# Patient Record
Sex: Female | Born: 1940 | Race: White | Hispanic: No | Smoking: Never smoker
Health system: Southern US, Community
[De-identification: ages and names within clinical notes are randomized; demographics above are authoritative.]

## PROBLEM LIST (undated history)

## (undated) DIAGNOSIS — E039 Hypothyroidism, unspecified: Secondary | ICD-10-CM

## (undated) DIAGNOSIS — I509 Heart failure, unspecified: Secondary | ICD-10-CM

## (undated) DIAGNOSIS — I6529 Occlusion and stenosis of unspecified carotid artery: Secondary | ICD-10-CM

## (undated) DIAGNOSIS — N186 End stage renal disease: Secondary | ICD-10-CM

## (undated) DIAGNOSIS — N189 Chronic kidney disease, unspecified: Secondary | ICD-10-CM

## (undated) DIAGNOSIS — M1990 Unspecified osteoarthritis, unspecified site: Secondary | ICD-10-CM

## (undated) DIAGNOSIS — J45909 Unspecified asthma, uncomplicated: Secondary | ICD-10-CM

## (undated) DIAGNOSIS — C4491 Basal cell carcinoma of skin, unspecified: Secondary | ICD-10-CM

## (undated) DIAGNOSIS — I10 Essential (primary) hypertension: Secondary | ICD-10-CM

## (undated) DIAGNOSIS — F329 Major depressive disorder, single episode, unspecified: Secondary | ICD-10-CM

## (undated) DIAGNOSIS — E119 Type 2 diabetes mellitus without complications: Secondary | ICD-10-CM

## (undated) DIAGNOSIS — N184 Chronic kidney disease, stage 4 (severe): Secondary | ICD-10-CM

## (undated) DIAGNOSIS — F32A Depression, unspecified: Secondary | ICD-10-CM

## (undated) DIAGNOSIS — R0600 Dyspnea, unspecified: Secondary | ICD-10-CM

## (undated) DIAGNOSIS — D649 Anemia, unspecified: Secondary | ICD-10-CM

## (undated) DIAGNOSIS — Z992 Dependence on renal dialysis: Secondary | ICD-10-CM

## (undated) DIAGNOSIS — C801 Malignant (primary) neoplasm, unspecified: Secondary | ICD-10-CM

## (undated) HISTORY — DX: Occlusion and stenosis of unspecified carotid artery: I65.29

## (undated) HISTORY — PX: TONSILLECTOMY: SUR1361

## (undated) HISTORY — PX: APPENDECTOMY: SHX54

## (undated) HISTORY — PX: TUBAL LIGATION: SHX77

---
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
## 1998-10-30 ENCOUNTER — Other Ambulatory Visit: Admission: RE | Admit: 1998-10-30 | Discharge: 1998-10-30 | Payer: Self-pay | Admitting: Obstetrics and Gynecology

## 1998-12-05 ENCOUNTER — Other Ambulatory Visit: Admission: RE | Admit: 1998-12-05 | Discharge: 1998-12-05 | Payer: Self-pay | Admitting: Obstetrics and Gynecology

## 1999-03-07 ENCOUNTER — Ambulatory Visit (HOSPITAL_COMMUNITY): Admission: RE | Admit: 1999-03-07 | Discharge: 1999-03-07 | Payer: Self-pay | Admitting: Obstetrics & Gynecology

## 2000-04-29 ENCOUNTER — Other Ambulatory Visit: Admission: RE | Admit: 2000-04-29 | Discharge: 2000-04-29 | Payer: Self-pay | Admitting: Obstetrics and Gynecology

## 2000-07-28 ENCOUNTER — Ambulatory Visit (HOSPITAL_COMMUNITY): Admission: RE | Admit: 2000-07-28 | Discharge: 2000-07-28 | Payer: Self-pay | Admitting: Obstetrics and Gynecology

## 2000-07-28 ENCOUNTER — Encounter (INDEPENDENT_AMBULATORY_CARE_PROVIDER_SITE_OTHER): Payer: Self-pay | Admitting: Specialist

## 2002-08-02 ENCOUNTER — Emergency Department (HOSPITAL_COMMUNITY): Admission: EM | Admit: 2002-08-02 | Discharge: 2002-08-02 | Payer: Self-pay | Admitting: Emergency Medicine

## 2004-02-29 ENCOUNTER — Other Ambulatory Visit: Admission: RE | Admit: 2004-02-29 | Discharge: 2004-02-29 | Payer: Self-pay | Admitting: Obstetrics and Gynecology

## 2004-10-02 ENCOUNTER — Encounter: Admission: RE | Admit: 2004-10-02 | Discharge: 2004-10-02 | Payer: Self-pay | Admitting: Obstetrics and Gynecology

## 2005-04-29 ENCOUNTER — Other Ambulatory Visit: Admission: RE | Admit: 2005-04-29 | Discharge: 2005-04-29 | Payer: Self-pay | Admitting: Obstetrics and Gynecology

## 2006-05-28 ENCOUNTER — Encounter: Admission: RE | Admit: 2006-05-28 | Discharge: 2006-05-28 | Payer: Self-pay | Admitting: Obstetrics and Gynecology

## 2006-11-09 ENCOUNTER — Other Ambulatory Visit: Admission: RE | Admit: 2006-11-09 | Discharge: 2006-11-09 | Payer: Self-pay | Admitting: Obstetrics and Gynecology

## 2007-07-28 ENCOUNTER — Encounter: Admission: RE | Admit: 2007-07-28 | Discharge: 2007-07-28 | Payer: Self-pay | Admitting: Obstetrics and Gynecology

## 2007-12-29 ENCOUNTER — Other Ambulatory Visit: Admission: RE | Admit: 2007-12-29 | Discharge: 2007-12-29 | Payer: Self-pay | Admitting: Obstetrics and Gynecology

## 2009-12-31 ENCOUNTER — Other Ambulatory Visit: Admission: RE | Admit: 2009-12-31 | Discharge: 2009-12-31 | Payer: Self-pay | Admitting: Obstetrics and Gynecology

## 2011-02-14 ENCOUNTER — Other Ambulatory Visit: Payer: Self-pay | Admitting: Internal Medicine

## 2011-02-14 DIAGNOSIS — Z1231 Encounter for screening mammogram for malignant neoplasm of breast: Secondary | ICD-10-CM

## 2011-03-10 ENCOUNTER — Ambulatory Visit: Payer: Self-pay

## 2011-05-09 NOTE — Op Note (Signed)
Cassandra Baker, Cassandra Baker [facility]                        ACCOUNT NO.:  [id number]   MEDICAL RECORD NO.:  [id number]                   PATIENT TYPE:  EMS   LOCATION:  MINO                                 FACILITY:  [facility]   PHYSICIAN:  Youlanda Mighty. Luisa Dago., M.D.          DATE OF BIRTH:  01-Dec-1941   DATE OF PROCEDURE:  08/02/2002  DATE OF DISCHARGE:                                 OPERATIVE REPORT   PREOPERATIVE DIAGNOSES:  1. Stage 3 paronychia left ring finger.  2. Felon infection left ring finger pulp.   OPERATIONS:  1. Incision and drainage of nail wall abscess.  2. Drainage of left ring finger pulp.   SURGEON:  Youlanda Mighty. Sypher, M.D.   ASSISTANT:  None.   ANESTHESIA:  Lidocaine 2% and 0.25% Marcaine, metacarpal head level block,  left ring finger.   INDICATIONS:  The patient is a 70 year old woman, who recently returned from  a mission trip to [place], who had sustained an infection of the ulnar nail  fold of her left ring finger.  She had observed this for several days, noted  progressive collection of purulent material along the nail wall and dorsal  nail fold.  She saw her physician in [place], [place] earlier today and was placed  on Cipro oral antibiotic therapy.   This evening she developed a violaceous bleb on the dorsal aspect of her  finger.  She became alarmed and concerned that she may have a flesh-eating  bacterial infection.  She sought an urgent consult at [facility] and, hence, a  reconsult was requested.   Clinical examination at this time revealed an aggressive infection  consistent with either Staph or Streptococcal organisms.  She clearly had a  felon with increased turgor of her pulp and a purulent bleb on the dorsal  aspect of the nail fold.  We recommended an incision and drainage and  followup with continued antibiotic therapy.   DESCRIPTION OF PROCEDURE:  The patient was seen in the minor operating room  in a supine position on a gurney.  After informed consent, a  0.25% Marcaine  and 2% lidocaine metacarpal head level block was placed with a 27-gauge  needle.  When the anesthesia was satisfactory, the arm was prepped with  Betadine soap and solution, sterilely draped with sterile towels.  No  tourniquet was employed.   A 4 mm strip of the nail plate was removed from the ulnar aspect of the nail  fold.  With removal of the nail plate, the dorsal purulence was drained.  Debris was removed from along the ulnar nail wall.  The pulp was then  drained utilizing iris scissors along the periosteal surface of the distal  phalanx beginning ulnarly and moving radially.   Purulent material was recovered from the pulp.  After satisfactory  decompression of the pulp, the wound was irrigated with Betadine and  subsequently dressed with Xeroflo sterile gauze and Coban.  The patient was encouraged to take a second Cipro 500 mg tablet at this time  and will continue with 500 mg p.o. q.12 h.  She will return to our office  for followup in approximately 12 hours and at that time, will have a  dressing change and will initiate saline soaks.   She will contact our office p.r.n. fever or signs of worsening infection.                                               Youlanda Mighty Luisa Dago., M.D.    RVS/MEDQ  D:  08/02/2002  T:  08/04/2002  Job:  [id number]

## 2011-05-09 NOTE — H&P (Signed)
[facility]  Patient:    Cassandra Baker, Cassandra Baker                     MRN: [id number] Adm. Date:  07/28/00 Attending:  Olivia Canter. Theda Sers, M.D. CC:         [facility] Day Surgery             [facility]                         History and Physical  DATE OF BIRTH:                [birth year]  HISTORY OF PRESENT ILLNESS:   The patient is a 70 year old, gravida 1, para 1, white female, who is menopausal and manifested postmenopausal bleeding. Subsequently, she underwent a sonohysterogram which showed a 2.5 cm and a 1.3 endometrial polyp in the endometrial cavity.  The patient was advised to undergo D&C hysteroscopy to remove the polyps.  Risks of surgery including anesthetic complication, hemorrhage, infection, damage to adjacent structures including bladder, bowel, blood vessels, or ureters were discussed with the patient.  She was made aware of the risk of uterine perforation which could result in overwhelming life-threatening hemorrhage requiring emergent hysterectomy.  She was also made aware of the risk uterine perforation which could result in bowel damage requiring emergent colostomy.  The patient expresses understanding of and acceptance of these risks.  PAST MEDICAL HISTORY:         Significant for hypothyroidism and appendectomy.  ALLERGIES:                    PENICILLIN.  MEDICATIONS:                  Synthroid, allopurinol, Lipitor, and Lasix.  FAMILY HISTORY:               Mother and father with type 2 diabetes mellitus. Mother with two MIs.  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  REVIEW OF SYSTEMS:            Negative.  PHYSICAL EXAMINATION:  HEENT:                        Normal.  NECK:                         Supple without thyromegaly.  LUNGS:                        Clear to auscultation.  HEART:                        Regular rate and  rhythm.  ABDOMEN:                      Soft, nontender.  No hepatosplenomegaly.  PELVIC:                       Uterus slightly retroverted, six weeks mobile. No adnexal mass palpated.  Rectovaginal confirmatory.  No mass.  ASSESSMENT AND PLAN:          The patient is a 69 year old, gravida 1, para 1, with a history of  postmenopausal bleeding found to have endometrial polyps admitted for dilation and curettage hysteroscopy.  Risks have been discussed with the patient and she expresses understanding of and acceptance of these risks. DD:  07/28/00 TD:  07/28/00 Job: [id number]

## 2011-05-09 NOTE — Op Note (Signed)
[facility]  Patient:    Cassandra Baker, CUI [facility]                     MRN: [id number] Proc. Date: 07/28/00 Adm. Date:  [id number] Attending:  Lovey Newcomer CC:         Olivia Canter. Theda Sers, M.D.                           Operative Report  PREOPERATIVE DIAGNOSIS:  Postmenopausal bleeding, endometrial polyps.  POSTOPERATIVE DIAGNOSIS:  Postmenopausal bleeding, endometrial polyps versus fibroids.  OPERATION:  SURGEON:  Diana B. Theda Sers, M.D.  ESTIMATED BLOOD LOSS:  Minimal.  ANESTHESIA:  General by LMA.  COMPLICATIONS:  None.  FLUIDS:  1000 cc of crystalloid.  DRAINS:  None.  DESCRIPTION OF PROCEDURE:  The patient was brought to the operating room ____________ operating table.  After induction of adequate general anesthesia, the patient was placed in dorsal lithotomy position and prepped and draped in the usual sterile fashion.  The bladder was straight cathed for approximately 100 cc of clear yellow urine and examination under anesthesia revealed the uterus to be slightly retroverted, six weeks, mobile without any adnexal mass palpated.  Speculum was placed and the posterior lip of the cervix was grasped with a single-toothed tenaculum.  The cervix was very gently and carefully dilated up to a #25 Pratt dilator without difficulty. The dilatation proceeded very carefully and smoothly to decrease the risk of uterine perforation.  The hysteroscope was placed and a careful and thorough hysteroscopic examination was performed.  Both tubal ostia were identified. There were noted to be two large polyps or fibroids which were visualized. The scope was then withdrawn and the uterus was systematically curetted in a systematic clockwise fashion.  Using the Randall stone forceps and the small ring forceps, the polyps or fibroids were removed in their entirety and sent to pathology for examination.  The scope was again placed and the endometrium was noted to  have been well sampled in its entirety.  Both polyps or fibroids were noted to have been removed completely.  The patient tolerated the procedure well without apparent complications.  There was noted to be minimal bleeding at the tenaculum site but excellent hemostasis of this site was achieved using silver nitrate sticks.  Approximately 10 cc of 1% lidocaine was placed for a paracervical block.  The patient tolerated the procedure well without apparent complications, transferred to the recovery room in stable condition after all instrument, sponge, and needle counts were correct.  The patient was given the post-D&C instruction sheet, urged to take ibuprofen 600 mg p.o. q.6h. p.r.n. pain and return to the office in two to three weeks for postoperative examination and call for any problems. DD:  07/28/00 TD:  07/28/00 Job: [id number]

## 2011-10-24 ENCOUNTER — Ambulatory Visit
Admission: RE | Admit: 2011-10-24 | Discharge: 2011-10-24 | Disposition: A | Discharge status: Home / Self Care | Payer: Medicare Other | Source: Ambulatory Visit | Attending: Internal Medicine | Admitting: Internal Medicine

## 2011-10-24 DIAGNOSIS — Z1231 Encounter for screening mammogram for malignant neoplasm of breast: Secondary | ICD-10-CM

## 2012-02-18 DIAGNOSIS — I10 Essential (primary) hypertension: Secondary | ICD-10-CM | POA: Diagnosis not present

## 2012-02-18 DIAGNOSIS — Z0000 Encounter for general adult medical examination without abnormal findings: Secondary | ICD-10-CM | POA: Diagnosis not present

## 2012-02-18 DIAGNOSIS — E663 Overweight: Secondary | ICD-10-CM | POA: Diagnosis not present

## 2012-02-18 DIAGNOSIS — N951 Menopausal and female climacteric states: Secondary | ICD-10-CM | POA: Diagnosis not present

## 2012-02-18 DIAGNOSIS — R7301 Impaired fasting glucose: Secondary | ICD-10-CM | POA: Diagnosis not present

## 2012-02-18 DIAGNOSIS — E039 Hypothyroidism, unspecified: Secondary | ICD-10-CM | POA: Diagnosis not present

## 2012-02-18 DIAGNOSIS — E782 Mixed hyperlipidemia: Secondary | ICD-10-CM | POA: Diagnosis not present

## 2012-02-18 DIAGNOSIS — I509 Heart failure, unspecified: Secondary | ICD-10-CM | POA: Diagnosis not present

## 2012-03-03 DIAGNOSIS — Z780 Asymptomatic menopausal state: Secondary | ICD-10-CM | POA: Diagnosis not present

## 2012-04-22 DIAGNOSIS — J309 Allergic rhinitis, unspecified: Secondary | ICD-10-CM | POA: Diagnosis not present

## 2012-04-22 DIAGNOSIS — H1045 Other chronic allergic conjunctivitis: Secondary | ICD-10-CM | POA: Diagnosis not present

## 2012-04-22 DIAGNOSIS — J45909 Unspecified asthma, uncomplicated: Secondary | ICD-10-CM | POA: Diagnosis not present

## 2012-07-19 DIAGNOSIS — E119 Type 2 diabetes mellitus without complications: Secondary | ICD-10-CM | POA: Diagnosis not present

## 2012-08-31 DIAGNOSIS — L989 Disorder of the skin and subcutaneous tissue, unspecified: Secondary | ICD-10-CM | POA: Diagnosis not present

## 2012-09-02 DIAGNOSIS — I10 Essential (primary) hypertension: Secondary | ICD-10-CM | POA: Diagnosis not present

## 2012-09-02 DIAGNOSIS — E039 Hypothyroidism, unspecified: Secondary | ICD-10-CM | POA: Diagnosis not present

## 2012-09-02 DIAGNOSIS — E785 Hyperlipidemia, unspecified: Secondary | ICD-10-CM | POA: Diagnosis not present

## 2012-09-02 DIAGNOSIS — I519 Heart disease, unspecified: Secondary | ICD-10-CM | POA: Diagnosis not present

## 2012-09-06 DIAGNOSIS — I10 Essential (primary) hypertension: Secondary | ICD-10-CM | POA: Diagnosis not present

## 2012-09-25 ENCOUNTER — Emergency Department: Payer: Self-pay | Admitting: Unknown Physician Specialty

## 2012-09-25 DIAGNOSIS — Z880 Allergy status to penicillin: Secondary | ICD-10-CM | POA: Diagnosis not present

## 2012-09-25 DIAGNOSIS — L500 Allergic urticaria: Secondary | ICD-10-CM | POA: Diagnosis not present

## 2012-09-25 LAB — CBC
HGB: 11.5 g/dL — ABNORMAL LOW (ref 12.0–16.0)
MCH: 27.1 pg (ref 26.0–34.0)
MCHC: 32.5 g/dL (ref 32.0–36.0)
Platelet: 207 10*3/uL (ref 150–440)
RDW: 15.9 % — ABNORMAL HIGH (ref 11.5–14.5)
WBC: 12.4 10*3/uL — ABNORMAL HIGH (ref 3.6–11.0)

## 2012-09-25 LAB — COMPREHENSIVE METABOLIC PANEL
Albumin: 3.3 g/dL — ABNORMAL LOW (ref 3.4–5.0)
Anion Gap: 11 (ref 7–16)
BUN: 46 mg/dL — ABNORMAL HIGH (ref 7–18)
Calcium, Total: 9.9 mg/dL (ref 8.5–10.1)
Co2: 21 mmol/L (ref 21–32)
EGFR (Non-African Amer.): 22 — ABNORMAL LOW
Glucose: 170 mg/dL — ABNORMAL HIGH (ref 65–99)
Osmolality: 293 (ref 275–301)
Potassium: 4.4 mmol/L (ref 3.5–5.1)
SGOT(AST): 20 U/L (ref 15–37)
Sodium: 139 mmol/L (ref 136–145)
Total Protein: 7.7 g/dL (ref 6.4–8.2)

## 2012-10-29 DIAGNOSIS — J018 Other acute sinusitis: Secondary | ICD-10-CM | POA: Diagnosis not present

## 2012-10-29 DIAGNOSIS — C44319 Basal cell carcinoma of skin of other parts of face: Secondary | ICD-10-CM | POA: Diagnosis not present

## 2012-10-29 DIAGNOSIS — L910 Hypertrophic scar: Secondary | ICD-10-CM | POA: Diagnosis not present

## 2012-12-22 HISTORY — PX: EYE SURGERY: SHX253

## 2012-12-30 DIAGNOSIS — J309 Allergic rhinitis, unspecified: Secondary | ICD-10-CM | POA: Diagnosis not present

## 2012-12-30 DIAGNOSIS — H1045 Other chronic allergic conjunctivitis: Secondary | ICD-10-CM | POA: Diagnosis not present

## 2012-12-30 DIAGNOSIS — J45909 Unspecified asthma, uncomplicated: Secondary | ICD-10-CM | POA: Diagnosis not present

## 2013-01-03 DIAGNOSIS — E119 Type 2 diabetes mellitus without complications: Secondary | ICD-10-CM | POA: Diagnosis not present

## 2013-01-05 DIAGNOSIS — Z23 Encounter for immunization: Secondary | ICD-10-CM | POA: Diagnosis not present

## 2013-06-28 DIAGNOSIS — H43819 Vitreous degeneration, unspecified eye: Secondary | ICD-10-CM | POA: Diagnosis not present

## 2013-08-05 DIAGNOSIS — H2510 Age-related nuclear cataract, unspecified eye: Secondary | ICD-10-CM | POA: Diagnosis not present

## 2013-08-19 DIAGNOSIS — H2510 Age-related nuclear cataract, unspecified eye: Secondary | ICD-10-CM | POA: Diagnosis not present

## 2013-08-31 ENCOUNTER — Ambulatory Visit: Payer: Self-pay | Admitting: Ophthalmology

## 2013-08-31 DIAGNOSIS — Z91018 Allergy to other foods: Secondary | ICD-10-CM | POA: Diagnosis not present

## 2013-08-31 DIAGNOSIS — M1710 Unilateral primary osteoarthritis, unspecified knee: Secondary | ICD-10-CM | POA: Diagnosis not present

## 2013-08-31 DIAGNOSIS — I509 Heart failure, unspecified: Secondary | ICD-10-CM | POA: Diagnosis not present

## 2013-08-31 DIAGNOSIS — Z85828 Personal history of other malignant neoplasm of skin: Secondary | ICD-10-CM | POA: Diagnosis not present

## 2013-08-31 DIAGNOSIS — M109 Gout, unspecified: Secondary | ICD-10-CM | POA: Diagnosis not present

## 2013-08-31 DIAGNOSIS — Z880 Allergy status to penicillin: Secondary | ICD-10-CM | POA: Diagnosis not present

## 2013-08-31 DIAGNOSIS — Z7982 Long term (current) use of aspirin: Secondary | ICD-10-CM | POA: Diagnosis not present

## 2013-08-31 DIAGNOSIS — H2510 Age-related nuclear cataract, unspecified eye: Secondary | ICD-10-CM | POA: Diagnosis not present

## 2013-08-31 DIAGNOSIS — Z79899 Other long term (current) drug therapy: Secondary | ICD-10-CM | POA: Diagnosis not present

## 2013-08-31 DIAGNOSIS — E039 Hypothyroidism, unspecified: Secondary | ICD-10-CM | POA: Diagnosis not present

## 2013-08-31 DIAGNOSIS — I10 Essential (primary) hypertension: Secondary | ICD-10-CM | POA: Diagnosis not present

## 2013-08-31 DIAGNOSIS — F329 Major depressive disorder, single episode, unspecified: Secondary | ICD-10-CM | POA: Diagnosis not present

## 2013-08-31 DIAGNOSIS — H259 Unspecified age-related cataract: Secondary | ICD-10-CM | POA: Diagnosis not present

## 2013-08-31 DIAGNOSIS — R0602 Shortness of breath: Secondary | ICD-10-CM | POA: Diagnosis not present

## 2013-08-31 DIAGNOSIS — M19049 Primary osteoarthritis, unspecified hand: Secondary | ICD-10-CM | POA: Diagnosis not present

## 2013-08-31 DIAGNOSIS — R51 Headache: Secondary | ICD-10-CM | POA: Diagnosis not present

## 2013-08-31 DIAGNOSIS — E119 Type 2 diabetes mellitus without complications: Secondary | ICD-10-CM | POA: Diagnosis not present

## 2013-09-13 DIAGNOSIS — I10 Essential (primary) hypertension: Secondary | ICD-10-CM | POA: Diagnosis not present

## 2013-09-13 DIAGNOSIS — E119 Type 2 diabetes mellitus without complications: Secondary | ICD-10-CM | POA: Diagnosis not present

## 2013-09-13 DIAGNOSIS — E039 Hypothyroidism, unspecified: Secondary | ICD-10-CM | POA: Diagnosis not present

## 2013-09-13 DIAGNOSIS — Z0000 Encounter for general adult medical examination without abnormal findings: Secondary | ICD-10-CM | POA: Diagnosis not present

## 2013-09-13 DIAGNOSIS — E782 Mixed hyperlipidemia: Secondary | ICD-10-CM | POA: Diagnosis not present

## 2013-09-13 DIAGNOSIS — I519 Heart disease, unspecified: Secondary | ICD-10-CM | POA: Diagnosis not present

## 2013-10-21 DIAGNOSIS — E039 Hypothyroidism, unspecified: Secondary | ICD-10-CM | POA: Diagnosis not present

## 2013-12-29 DIAGNOSIS — T781XXA Other adverse food reactions, not elsewhere classified, initial encounter: Secondary | ICD-10-CM | POA: Diagnosis not present

## 2013-12-29 DIAGNOSIS — J45909 Unspecified asthma, uncomplicated: Secondary | ICD-10-CM | POA: Diagnosis not present

## 2013-12-29 DIAGNOSIS — J309 Allergic rhinitis, unspecified: Secondary | ICD-10-CM | POA: Diagnosis not present

## 2013-12-29 DIAGNOSIS — H1045 Other chronic allergic conjunctivitis: Secondary | ICD-10-CM | POA: Diagnosis not present

## 2014-01-10 DIAGNOSIS — I519 Heart disease, unspecified: Secondary | ICD-10-CM | POA: Diagnosis not present

## 2014-01-10 DIAGNOSIS — E1129 Type 2 diabetes mellitus with other diabetic kidney complication: Secondary | ICD-10-CM | POA: Diagnosis not present

## 2014-01-10 DIAGNOSIS — E785 Hyperlipidemia, unspecified: Secondary | ICD-10-CM | POA: Diagnosis not present

## 2014-01-10 DIAGNOSIS — I10 Essential (primary) hypertension: Secondary | ICD-10-CM | POA: Diagnosis not present

## 2014-01-10 DIAGNOSIS — N183 Chronic kidney disease, stage 3 unspecified: Secondary | ICD-10-CM | POA: Diagnosis not present

## 2014-01-10 DIAGNOSIS — E039 Hypothyroidism, unspecified: Secondary | ICD-10-CM | POA: Diagnosis not present

## 2014-01-10 DIAGNOSIS — E781 Pure hyperglyceridemia: Secondary | ICD-10-CM | POA: Diagnosis not present

## 2014-03-03 ENCOUNTER — Emergency Department: Payer: Self-pay | Admitting: Emergency Medicine

## 2014-03-03 DIAGNOSIS — Z7982 Long term (current) use of aspirin: Secondary | ICD-10-CM | POA: Diagnosis not present

## 2014-03-03 DIAGNOSIS — M545 Low back pain, unspecified: Secondary | ICD-10-CM | POA: Diagnosis not present

## 2014-03-03 DIAGNOSIS — N390 Urinary tract infection, site not specified: Secondary | ICD-10-CM | POA: Diagnosis not present

## 2014-03-03 DIAGNOSIS — Z79899 Other long term (current) drug therapy: Secondary | ICD-10-CM | POA: Diagnosis not present

## 2014-03-03 DIAGNOSIS — Z880 Allergy status to penicillin: Secondary | ICD-10-CM | POA: Diagnosis not present

## 2014-03-03 DIAGNOSIS — E119 Type 2 diabetes mellitus without complications: Secondary | ICD-10-CM | POA: Diagnosis not present

## 2014-03-03 DIAGNOSIS — I10 Essential (primary) hypertension: Secondary | ICD-10-CM | POA: Diagnosis not present

## 2014-03-03 DIAGNOSIS — M549 Dorsalgia, unspecified: Secondary | ICD-10-CM | POA: Diagnosis not present

## 2014-03-03 DIAGNOSIS — I509 Heart failure, unspecified: Secondary | ICD-10-CM | POA: Diagnosis not present

## 2014-03-03 LAB — COMPREHENSIVE METABOLIC PANEL
ANION GAP: 5 — AB (ref 7–16)
Albumin: 3.2 g/dL — ABNORMAL LOW (ref 3.4–5.0)
Alkaline Phosphatase: 144 U/L — ABNORMAL HIGH
BILIRUBIN TOTAL: 0.2 mg/dL (ref 0.2–1.0)
BUN: 44 mg/dL — ABNORMAL HIGH (ref 7–18)
CO2: 25 mmol/L (ref 21–32)
CREATININE: 1.86 mg/dL — AB (ref 0.60–1.30)
Calcium, Total: 9.5 mg/dL (ref 8.5–10.1)
Chloride: 106 mmol/L (ref 98–107)
EGFR (African American): 31 — ABNORMAL LOW
GFR CALC NON AF AMER: 26 — AB
Glucose: 212 mg/dL — ABNORMAL HIGH (ref 65–99)
OSMOLALITY: 289 (ref 275–301)
Potassium: 4.4 mmol/L (ref 3.5–5.1)
SGOT(AST): 12 U/L — ABNORMAL LOW (ref 15–37)
SGPT (ALT): 24 U/L (ref 12–78)
Sodium: 136 mmol/L (ref 136–145)
Total Protein: 7.6 g/dL (ref 6.4–8.2)

## 2014-03-03 LAB — CBC
HCT: 35.9 % (ref 35.0–47.0)
HGB: 12 g/dL (ref 12.0–16.0)
MCH: 28 pg (ref 26.0–34.0)
MCHC: 33.4 g/dL (ref 32.0–36.0)
MCV: 84 fL (ref 80–100)
PLATELETS: 177 10*3/uL (ref 150–440)
RBC: 4.28 10*6/uL (ref 3.80–5.20)
RDW: 15.3 % — ABNORMAL HIGH (ref 11.5–14.5)
WBC: 8.1 10*3/uL (ref 3.6–11.0)

## 2014-03-03 LAB — URINALYSIS, COMPLETE
BLOOD: NEGATIVE
Bacteria: NONE SEEN
Bilirubin,UR: NEGATIVE
Glucose,UR: 50 mg/dL (ref 0–75)
Ketone: NEGATIVE
Nitrite: NEGATIVE
Ph: 6 (ref 4.5–8.0)
Protein: 100
SPECIFIC GRAVITY: 1.005 (ref 1.003–1.030)
Squamous Epithelial: 1

## 2014-03-03 LAB — LIPASE, BLOOD: LIPASE: 192 U/L (ref 73–393)

## 2014-03-20 DIAGNOSIS — I10 Essential (primary) hypertension: Secondary | ICD-10-CM | POA: Diagnosis not present

## 2014-04-14 DIAGNOSIS — H2510 Age-related nuclear cataract, unspecified eye: Secondary | ICD-10-CM | POA: Diagnosis not present

## 2014-04-19 DIAGNOSIS — H2510 Age-related nuclear cataract, unspecified eye: Secondary | ICD-10-CM | POA: Diagnosis not present

## 2014-04-26 ENCOUNTER — Ambulatory Visit: Payer: Self-pay | Admitting: Ophthalmology

## 2014-04-26 DIAGNOSIS — H2510 Age-related nuclear cataract, unspecified eye: Secondary | ICD-10-CM | POA: Diagnosis not present

## 2014-04-26 DIAGNOSIS — Z91018 Allergy to other foods: Secondary | ICD-10-CM | POA: Diagnosis not present

## 2014-04-26 DIAGNOSIS — Z85828 Personal history of other malignant neoplasm of skin: Secondary | ICD-10-CM | POA: Diagnosis not present

## 2014-04-26 DIAGNOSIS — M129 Arthropathy, unspecified: Secondary | ICD-10-CM | POA: Diagnosis not present

## 2014-04-26 DIAGNOSIS — Z880 Allergy status to penicillin: Secondary | ICD-10-CM | POA: Diagnosis not present

## 2014-04-26 DIAGNOSIS — E669 Obesity, unspecified: Secondary | ICD-10-CM | POA: Diagnosis not present

## 2014-04-26 DIAGNOSIS — F329 Major depressive disorder, single episode, unspecified: Secondary | ICD-10-CM | POA: Diagnosis not present

## 2014-04-26 DIAGNOSIS — Z79899 Other long term (current) drug therapy: Secondary | ICD-10-CM | POA: Diagnosis not present

## 2014-04-26 DIAGNOSIS — H259 Unspecified age-related cataract: Secondary | ICD-10-CM | POA: Diagnosis not present

## 2014-04-26 DIAGNOSIS — I10 Essential (primary) hypertension: Secondary | ICD-10-CM | POA: Diagnosis not present

## 2014-04-26 DIAGNOSIS — M109 Gout, unspecified: Secondary | ICD-10-CM | POA: Diagnosis not present

## 2014-04-26 DIAGNOSIS — Z7982 Long term (current) use of aspirin: Secondary | ICD-10-CM | POA: Diagnosis not present

## 2014-04-26 DIAGNOSIS — R011 Cardiac murmur, unspecified: Secondary | ICD-10-CM | POA: Diagnosis not present

## 2014-04-26 DIAGNOSIS — I509 Heart failure, unspecified: Secondary | ICD-10-CM | POA: Diagnosis not present

## 2014-04-26 DIAGNOSIS — F3289 Other specified depressive episodes: Secondary | ICD-10-CM | POA: Diagnosis not present

## 2014-04-26 DIAGNOSIS — E119 Type 2 diabetes mellitus without complications: Secondary | ICD-10-CM | POA: Diagnosis not present

## 2014-05-09 DIAGNOSIS — N183 Chronic kidney disease, stage 3 unspecified: Secondary | ICD-10-CM | POA: Diagnosis not present

## 2014-05-09 DIAGNOSIS — E785 Hyperlipidemia, unspecified: Secondary | ICD-10-CM | POA: Diagnosis not present

## 2014-05-09 DIAGNOSIS — I10 Essential (primary) hypertension: Secondary | ICD-10-CM | POA: Diagnosis not present

## 2014-05-09 DIAGNOSIS — E1129 Type 2 diabetes mellitus with other diabetic kidney complication: Secondary | ICD-10-CM | POA: Diagnosis not present

## 2014-05-09 DIAGNOSIS — M109 Gout, unspecified: Secondary | ICD-10-CM | POA: Diagnosis not present

## 2014-08-21 DIAGNOSIS — N183 Chronic kidney disease, stage 3 unspecified: Secondary | ICD-10-CM | POA: Diagnosis not present

## 2014-08-21 DIAGNOSIS — D631 Anemia in chronic kidney disease: Secondary | ICD-10-CM | POA: Diagnosis not present

## 2014-08-21 DIAGNOSIS — R809 Proteinuria, unspecified: Secondary | ICD-10-CM | POA: Diagnosis not present

## 2014-08-21 DIAGNOSIS — N039 Chronic nephritic syndrome with unspecified morphologic changes: Secondary | ICD-10-CM | POA: Diagnosis not present

## 2014-08-21 DIAGNOSIS — N2581 Secondary hyperparathyroidism of renal origin: Secondary | ICD-10-CM | POA: Diagnosis not present

## 2014-08-30 ENCOUNTER — Other Ambulatory Visit (HOSPITAL_COMMUNITY): Payer: Self-pay | Admitting: Nephrology

## 2014-08-30 DIAGNOSIS — E210 Primary hyperparathyroidism: Secondary | ICD-10-CM

## 2014-09-25 DIAGNOSIS — N183 Chronic kidney disease, stage 3 (moderate): Secondary | ICD-10-CM | POA: Diagnosis not present

## 2014-09-28 DIAGNOSIS — N183 Chronic kidney disease, stage 3 (moderate): Secondary | ICD-10-CM | POA: Diagnosis not present

## 2014-10-02 ENCOUNTER — Encounter (HOSPITAL_COMMUNITY)
Admission: RE | Admit: 2014-10-02 | Discharge: 2014-10-02 | Disposition: A | Discharge status: Home / Self Care | Payer: Medicare Other | Source: Ambulatory Visit | Attending: Nephrology | Admitting: Nephrology

## 2014-10-02 ENCOUNTER — Encounter (HOSPITAL_COMMUNITY)
Admission: RE | Admit: 2014-10-02 | Discharge: 2014-10-02 | Disposition: A | Discharge status: Home / Self Care | Payer: Medicare Other | Source: Ambulatory Visit | Attending: Diagnostic Radiology | Admitting: Diagnostic Radiology

## 2014-10-02 DIAGNOSIS — Z79899 Other long term (current) drug therapy: Secondary | ICD-10-CM | POA: Diagnosis not present

## 2014-10-02 DIAGNOSIS — E210 Primary hyperparathyroidism: Secondary | ICD-10-CM

## 2014-10-02 DIAGNOSIS — R946 Abnormal results of thyroid function studies: Secondary | ICD-10-CM | POA: Diagnosis not present

## 2014-10-02 DIAGNOSIS — E213 Hyperparathyroidism, unspecified: Secondary | ICD-10-CM | POA: Insufficient documentation

## 2014-10-02 DIAGNOSIS — E875 Hyperkalemia: Secondary | ICD-10-CM | POA: Diagnosis not present

## 2014-10-02 DIAGNOSIS — E039 Hypothyroidism, unspecified: Secondary | ICD-10-CM | POA: Diagnosis not present

## 2014-10-02 MED ORDER — TECHNETIUM TC 99M SESTAMIBI GENERIC - CARDIOLITE
25.0000 | Freq: Once | INTRAVENOUS | Status: AC | PRN
  Administered 2014-10-02: 25 via INTRAVENOUS

## 2014-10-09 ENCOUNTER — Other Ambulatory Visit (INDEPENDENT_AMBULATORY_CARE_PROVIDER_SITE_OTHER): Payer: Self-pay

## 2014-10-09 DIAGNOSIS — E042 Nontoxic multinodular goiter: Secondary | ICD-10-CM

## 2014-10-09 DIAGNOSIS — E041 Nontoxic single thyroid nodule: Secondary | ICD-10-CM | POA: Diagnosis not present

## 2014-10-09 DIAGNOSIS — E210 Primary hyperparathyroidism: Secondary | ICD-10-CM | POA: Diagnosis not present

## 2014-10-10 ENCOUNTER — Ambulatory Visit
Admission: RE | Admit: 2014-10-10 | Discharge: 2014-10-10 | Disposition: A | Discharge status: Home / Self Care | Payer: Medicare Other | Source: Ambulatory Visit | Attending: Surgery | Admitting: Surgery

## 2014-10-10 DIAGNOSIS — E041 Nontoxic single thyroid nodule: Secondary | ICD-10-CM | POA: Diagnosis not present

## 2014-10-10 DIAGNOSIS — E042 Nontoxic multinodular goiter: Secondary | ICD-10-CM

## 2014-10-20 DIAGNOSIS — Z23 Encounter for immunization: Secondary | ICD-10-CM | POA: Diagnosis not present

## 2014-10-20 DIAGNOSIS — Z1389 Encounter for screening for other disorder: Secondary | ICD-10-CM | POA: Diagnosis not present

## 2014-10-20 DIAGNOSIS — I5189 Other ill-defined heart diseases: Secondary | ICD-10-CM | POA: Insufficient documentation

## 2014-10-20 DIAGNOSIS — I129 Hypertensive chronic kidney disease with stage 1 through stage 4 chronic kidney disease, or unspecified chronic kidney disease: Secondary | ICD-10-CM | POA: Insufficient documentation

## 2014-10-20 DIAGNOSIS — Z0001 Encounter for general adult medical examination with abnormal findings: Secondary | ICD-10-CM | POA: Diagnosis not present

## 2014-10-20 DIAGNOSIS — E213 Hyperparathyroidism, unspecified: Secondary | ICD-10-CM | POA: Diagnosis not present

## 2014-10-20 DIAGNOSIS — E780 Pure hypercholesterolemia: Secondary | ICD-10-CM | POA: Diagnosis not present

## 2014-10-20 DIAGNOSIS — N183 Chronic kidney disease, stage 3 (moderate): Secondary | ICD-10-CM | POA: Diagnosis not present

## 2014-10-20 DIAGNOSIS — M8580 Other specified disorders of bone density and structure, unspecified site: Secondary | ICD-10-CM | POA: Diagnosis not present

## 2014-10-20 DIAGNOSIS — M8950 Osteolysis, unspecified site: Secondary | ICD-10-CM | POA: Insufficient documentation

## 2014-10-20 DIAGNOSIS — E041 Nontoxic single thyroid nodule: Secondary | ICD-10-CM | POA: Diagnosis not present

## 2014-10-20 DIAGNOSIS — L942 Calcinosis cutis: Secondary | ICD-10-CM | POA: Insufficient documentation

## 2014-10-20 DIAGNOSIS — E1122 Type 2 diabetes mellitus with diabetic chronic kidney disease: Secondary | ICD-10-CM | POA: Diagnosis not present

## 2014-10-30 ENCOUNTER — Other Ambulatory Visit (INDEPENDENT_AMBULATORY_CARE_PROVIDER_SITE_OTHER): Payer: Self-pay

## 2014-10-30 DIAGNOSIS — E042 Nontoxic multinodular goiter: Secondary | ICD-10-CM

## 2014-11-07 DIAGNOSIS — E210 Primary hyperparathyroidism: Secondary | ICD-10-CM | POA: Diagnosis not present

## 2014-11-07 DIAGNOSIS — N189 Chronic kidney disease, unspecified: Secondary | ICD-10-CM | POA: Diagnosis not present

## 2014-11-07 DIAGNOSIS — R809 Proteinuria, unspecified: Secondary | ICD-10-CM | POA: Diagnosis not present

## 2014-11-07 DIAGNOSIS — D631 Anemia in chronic kidney disease: Secondary | ICD-10-CM | POA: Diagnosis not present

## 2014-11-07 DIAGNOSIS — N183 Chronic kidney disease, stage 3 (moderate): Secondary | ICD-10-CM | POA: Diagnosis not present

## 2014-11-08 ENCOUNTER — Other Ambulatory Visit (HOSPITAL_COMMUNITY)
Admission: RE | Admit: 2014-11-08 | Discharge: 2014-11-08 | Disposition: A | Discharge status: Home / Self Care | Payer: Medicare Other | Source: Ambulatory Visit | Attending: Interventional Radiology | Admitting: Interventional Radiology

## 2014-11-08 ENCOUNTER — Ambulatory Visit
Admission: RE | Admit: 2014-11-08 | Discharge: 2014-11-08 | Disposition: A | Discharge status: Home / Self Care | Payer: Medicare Other | Source: Ambulatory Visit | Attending: Surgery | Admitting: Surgery

## 2014-11-08 DIAGNOSIS — E042 Nontoxic multinodular goiter: Secondary | ICD-10-CM

## 2014-11-08 DIAGNOSIS — E0789 Other specified disorders of thyroid: Secondary | ICD-10-CM | POA: Diagnosis not present

## 2014-11-08 DIAGNOSIS — E041 Nontoxic single thyroid nodule: Secondary | ICD-10-CM | POA: Insufficient documentation

## 2014-11-09 DIAGNOSIS — J300 Vasomotor rhinitis: Secondary | ICD-10-CM | POA: Diagnosis not present

## 2014-11-09 DIAGNOSIS — T781XXD Other adverse food reactions, not elsewhere classified, subsequent encounter: Secondary | ICD-10-CM | POA: Diagnosis not present

## 2014-11-09 DIAGNOSIS — H1045 Other chronic allergic conjunctivitis: Secondary | ICD-10-CM | POA: Diagnosis not present

## 2014-11-09 DIAGNOSIS — J4530 Mild persistent asthma, uncomplicated: Secondary | ICD-10-CM | POA: Diagnosis not present

## 2014-11-10 ENCOUNTER — Telehealth (INDEPENDENT_AMBULATORY_CARE_PROVIDER_SITE_OTHER): Payer: Self-pay

## 2014-11-10 NOTE — Telephone Encounter (Signed)
FNA result in epic and msg sent to Dr Harlow Asa to review and advise follow up.

## 2014-11-13 ENCOUNTER — Ambulatory Visit (INDEPENDENT_AMBULATORY_CARE_PROVIDER_SITE_OTHER): Payer: Self-pay | Admitting: Surgery

## 2014-11-21 DIAGNOSIS — M8580 Other specified disorders of bone density and structure, unspecified site: Secondary | ICD-10-CM | POA: Diagnosis not present

## 2014-12-01 ENCOUNTER — Encounter (HOSPITAL_COMMUNITY): Payer: Self-pay | Admitting: *Deleted

## 2014-12-04 DIAGNOSIS — E041 Nontoxic single thyroid nodule: Secondary | ICD-10-CM | POA: Diagnosis not present

## 2014-12-04 DIAGNOSIS — E210 Primary hyperparathyroidism: Secondary | ICD-10-CM | POA: Diagnosis not present

## 2014-12-07 ENCOUNTER — Encounter (HOSPITAL_COMMUNITY): Payer: Self-pay | Admitting: Surgery

## 2014-12-07 ENCOUNTER — Ambulatory Visit (HOSPITAL_COMMUNITY): Payer: Medicare Other | Admitting: Anesthesiology

## 2014-12-07 ENCOUNTER — Ambulatory Visit (HOSPITAL_COMMUNITY): Payer: Medicare Other

## 2014-12-07 ENCOUNTER — Encounter (HOSPITAL_COMMUNITY)
Admission: RE | Disposition: A | Discharge status: Home / Self Care | Payer: Self-pay | Source: Ambulatory Visit | Attending: Surgery

## 2014-12-07 ENCOUNTER — Observation Stay (HOSPITAL_COMMUNITY)
Admission: RE | Admit: 2014-12-07 | Discharge: 2014-12-08 | Disposition: A | Discharge status: Home / Self Care | Payer: Medicare Other | Source: Ambulatory Visit | Attending: Surgery | Admitting: Surgery

## 2014-12-07 DIAGNOSIS — D440 Neoplasm of uncertain behavior of thyroid gland: Secondary | ICD-10-CM | POA: Diagnosis not present

## 2014-12-07 DIAGNOSIS — E041 Nontoxic single thyroid nodule: Secondary | ICD-10-CM | POA: Insufficient documentation

## 2014-12-07 DIAGNOSIS — E063 Autoimmune thyroiditis: Secondary | ICD-10-CM | POA: Insufficient documentation

## 2014-12-07 DIAGNOSIS — I509 Heart failure, unspecified: Secondary | ICD-10-CM | POA: Diagnosis not present

## 2014-12-07 DIAGNOSIS — N186 End stage renal disease: Secondary | ICD-10-CM | POA: Insufficient documentation

## 2014-12-07 DIAGNOSIS — Z880 Allergy status to penicillin: Secondary | ICD-10-CM | POA: Insufficient documentation

## 2014-12-07 DIAGNOSIS — I120 Hypertensive chronic kidney disease with stage 5 chronic kidney disease or end stage renal disease: Secondary | ICD-10-CM | POA: Diagnosis not present

## 2014-12-07 DIAGNOSIS — E213 Hyperparathyroidism, unspecified: Secondary | ICD-10-CM | POA: Diagnosis not present

## 2014-12-07 DIAGNOSIS — M1990 Unspecified osteoarthritis, unspecified site: Secondary | ICD-10-CM | POA: Diagnosis not present

## 2014-12-07 DIAGNOSIS — E119 Type 2 diabetes mellitus without complications: Secondary | ICD-10-CM | POA: Insufficient documentation

## 2014-12-07 DIAGNOSIS — I10 Essential (primary) hypertension: Secondary | ICD-10-CM

## 2014-12-07 DIAGNOSIS — C439 Malignant melanoma of skin, unspecified: Secondary | ICD-10-CM | POA: Insufficient documentation

## 2014-12-07 DIAGNOSIS — E210 Primary hyperparathyroidism: Secondary | ICD-10-CM | POA: Diagnosis not present

## 2014-12-07 DIAGNOSIS — Z0389 Encounter for observation for other suspected diseases and conditions ruled out: Secondary | ICD-10-CM | POA: Diagnosis not present

## 2014-12-07 DIAGNOSIS — D351 Benign neoplasm of parathyroid gland: Secondary | ICD-10-CM | POA: Diagnosis not present

## 2014-12-07 HISTORY — DX: Chronic kidney disease, unspecified: N18.9

## 2014-12-07 HISTORY — PX: THYROID LOBECTOMY: SHX420

## 2014-12-07 HISTORY — DX: Essential (primary) hypertension: I10

## 2014-12-07 HISTORY — PX: PARATHYROIDECTOMY: SHX19

## 2014-12-07 HISTORY — DX: Malignant (primary) neoplasm, unspecified: C80.1

## 2014-12-07 HISTORY — DX: Hypothyroidism, unspecified: E03.9

## 2014-12-07 HISTORY — DX: Unspecified asthma, uncomplicated: J45.909

## 2014-12-07 HISTORY — DX: Unspecified osteoarthritis, unspecified site: M19.90

## 2014-12-07 HISTORY — DX: Type 2 diabetes mellitus without complications: E11.9

## 2014-12-07 HISTORY — DX: Heart failure, unspecified: I50.9

## 2014-12-07 LAB — BASIC METABOLIC PANEL
Anion gap: 15 (ref 5–15)
BUN: 74 mg/dL — AB (ref 6–23)
CHLORIDE: 102 meq/L (ref 96–112)
CO2: 20 meq/L (ref 19–32)
Calcium: 10.9 mg/dL — ABNORMAL HIGH (ref 8.4–10.5)
Creatinine, Ser: 2.23 mg/dL — ABNORMAL HIGH (ref 0.50–1.10)
GFR calc Af Amer: 24 mL/min — ABNORMAL LOW (ref >90)
GFR calc non Af Amer: 21 mL/min — ABNORMAL LOW (ref >90)
Glucose, Bld: 156 mg/dL — ABNORMAL HIGH (ref 70–99)
Potassium: 4.3 mEq/L (ref 3.7–5.3)
Sodium: 137 mEq/L (ref 137–147)

## 2014-12-07 LAB — GLUCOSE, CAPILLARY
GLUCOSE-CAPILLARY: 124 mg/dL — AB (ref 70–99)
Glucose-Capillary: 141 mg/dL — ABNORMAL HIGH (ref 70–99)

## 2014-12-07 LAB — CBC
HCT: 38.7 % (ref 36.0–46.0)
Hemoglobin: 12.3 g/dL (ref 12.0–15.0)
MCH: 27.3 pg (ref 26.0–34.0)
MCHC: 31.8 g/dL (ref 30.0–36.0)
MCV: 85.8 fL (ref 78.0–100.0)
Platelets: 216 10*3/uL (ref 150–400)
RBC: 4.51 MIL/uL (ref 3.87–5.11)
RDW: 15.1 % (ref 11.5–15.5)
WBC: 10.2 10*3/uL (ref 4.0–10.5)

## 2014-12-07 SURGERY — LOBECTOMY, THYROID
Anesthesia: General | Site: Neck

## 2014-12-07 MED ORDER — DEXAMETHASONE SODIUM PHOSPHATE 10 MG/ML IJ SOLN
INTRAMUSCULAR | Status: DC | PRN
  Administered 2014-12-07: 10 mg via INTRAVENOUS

## 2014-12-07 MED ORDER — GLYCOPYRROLATE 0.2 MG/ML IJ SOLN
INTRAMUSCULAR | Status: AC
  Filled 2014-12-07: qty 3

## 2014-12-07 MED ORDER — FLUOXETINE HCL 10 MG PO CAPS
10.0000 mg | ORAL_CAPSULE | Freq: Every day | ORAL | Status: DC
  Administered 2014-12-07: 10 mg via ORAL
  Filled 2014-12-07 (×2): qty 1

## 2014-12-07 MED ORDER — ONDANSETRON HCL 4 MG/2ML IJ SOLN
INTRAMUSCULAR | Status: DC | PRN
  Administered 2014-12-07: 4 mg via INTRAVENOUS

## 2014-12-07 MED ORDER — PHENYLEPHRINE HCL 10 MG/ML IJ SOLN
INTRAMUSCULAR | Status: DC | PRN
  Administered 2014-12-07: 40 ug via INTRAVENOUS
  Administered 2014-12-07 (×2): 80 ug via INTRAVENOUS

## 2014-12-07 MED ORDER — ONDANSETRON HCL 4 MG/2ML IJ SOLN
INTRAMUSCULAR | Status: AC
  Filled 2014-12-07: qty 2

## 2014-12-07 MED ORDER — ONDANSETRON HCL 4 MG/2ML IJ SOLN
4.0000 mg | Freq: Four times a day (QID) | INTRAMUSCULAR | Status: DC | PRN
  Administered 2014-12-07: 4 mg via INTRAVENOUS
  Filled 2014-12-07: qty 2

## 2014-12-07 MED ORDER — DEXAMETHASONE SODIUM PHOSPHATE 10 MG/ML IJ SOLN
INTRAMUSCULAR | Status: AC
  Filled 2014-12-07: qty 1

## 2014-12-07 MED ORDER — LIDOCAINE HCL (CARDIAC) 20 MG/ML IV SOLN
INTRAVENOUS | Status: DC | PRN
  Administered 2014-12-07: 100 mg via INTRAVENOUS

## 2014-12-07 MED ORDER — NEOSTIGMINE METHYLSULFATE 10 MG/10ML IV SOLN
INTRAVENOUS | Status: AC
  Filled 2014-12-07: qty 1

## 2014-12-07 MED ORDER — LEVOTHYROXINE SODIUM 100 MCG PO TABS
100.0000 ug | ORAL_TABLET | Freq: Every day | ORAL | Status: DC
  Administered 2014-12-08: 100 ug via ORAL
  Filled 2014-12-07 (×2): qty 1

## 2014-12-07 MED ORDER — GLYCOPYRROLATE 0.2 MG/ML IJ SOLN
INTRAMUSCULAR | Status: DC | PRN
  Administered 2014-12-07: 0.6 mg via INTRAVENOUS

## 2014-12-07 MED ORDER — HYDROCODONE-ACETAMINOPHEN 5-325 MG PO TABS
1.0000 | ORAL_TABLET | ORAL | Status: DC | PRN
  Administered 2014-12-07 – 2014-12-08 (×2): 1 via ORAL
  Filled 2014-12-07: qty 1
  Filled 2014-12-07: qty 2

## 2014-12-07 MED ORDER — FENTANYL CITRATE 0.05 MG/ML IJ SOLN: 25.0000 ug | INTRAMUSCULAR | Status: DC | PRN

## 2014-12-07 MED ORDER — LACTATED RINGERS IV SOLN
INTRAVENOUS | Status: DC
  Administered 2014-12-07 (×2): via INTRAVENOUS

## 2014-12-07 MED ORDER — HYDROMORPHONE HCL 1 MG/ML IJ SOLN: 1.0000 mg | INTRAMUSCULAR | Status: DC | PRN

## 2014-12-07 MED ORDER — SUCCINYLCHOLINE CHLORIDE 20 MG/ML IJ SOLN
INTRAMUSCULAR | Status: DC | PRN
  Administered 2014-12-07: 100 mg via INTRAVENOUS

## 2014-12-07 MED ORDER — ROCURONIUM BROMIDE 100 MG/10ML IV SOLN
INTRAVENOUS | Status: DC | PRN
  Administered 2014-12-07: 30 mg via INTRAVENOUS

## 2014-12-07 MED ORDER — MEPERIDINE HCL 50 MG/ML IJ SOLN: 6.2500 mg | INTRAMUSCULAR | Status: DC | PRN

## 2014-12-07 MED ORDER — ACETAMINOPHEN 325 MG PO TABS: 650.0000 mg | ORAL_TABLET | ORAL | Status: DC | PRN

## 2014-12-07 MED ORDER — FENTANYL CITRATE 0.05 MG/ML IJ SOLN
INTRAMUSCULAR | Status: AC
  Filled 2014-12-07: qty 5

## 2014-12-07 MED ORDER — PROPOFOL 10 MG/ML IV BOLUS
INTRAVENOUS | Status: AC
  Filled 2014-12-07: qty 20

## 2014-12-07 MED ORDER — ROCURONIUM BROMIDE 100 MG/10ML IV SOLN
INTRAVENOUS | Status: AC
  Filled 2014-12-07: qty 1

## 2014-12-07 MED ORDER — PROMETHAZINE HCL 25 MG/ML IJ SOLN: 6.2500 mg | INTRAMUSCULAR | Status: DC | PRN

## 2014-12-07 MED ORDER — RAMIPRIL 10 MG PO CAPS
10.0000 mg | ORAL_CAPSULE | Freq: Every day | ORAL | Status: DC
  Administered 2014-12-07: 10 mg via ORAL
  Filled 2014-12-07 (×2): qty 1

## 2014-12-07 MED ORDER — CEFAZOLIN SODIUM-DEXTROSE 2-3 GM-% IV SOLR
2.0000 g | INTRAVENOUS | Status: AC
  Administered 2014-12-07: 2 g via INTRAVENOUS

## 2014-12-07 MED ORDER — FUROSEMIDE 80 MG PO TABS
80.0000 mg | ORAL_TABLET | Freq: Two times a day (BID) | ORAL | Status: DC
Start: 2014-12-07 — End: 2014-12-08
  Administered 2014-12-07: 80 mg via ORAL
  Filled 2014-12-07 (×4): qty 1

## 2014-12-07 MED ORDER — NEOSTIGMINE METHYLSULFATE 10 MG/10ML IV SOLN
INTRAVENOUS | Status: DC | PRN
Start: 2014-12-07 — End: 2014-12-07
  Administered 2014-12-07: 5 mg via INTRAVENOUS

## 2014-12-07 MED ORDER — PROPOFOL 10 MG/ML IV BOLUS
INTRAVENOUS | Status: DC | PRN
  Administered 2014-12-07: 200 mg via INTRAVENOUS

## 2014-12-07 MED ORDER — ONDANSETRON HCL 4 MG PO TABS: 4.0000 mg | ORAL_TABLET | Freq: Four times a day (QID) | ORAL | Status: DC | PRN

## 2014-12-07 MED ORDER — 0.9 % SODIUM CHLORIDE (POUR BTL) OPTIME
TOPICAL | Status: DC | PRN
  Administered 2014-12-07: 1000 mL

## 2014-12-07 MED ORDER — FENTANYL CITRATE 0.05 MG/ML IJ SOLN
INTRAMUSCULAR | Status: DC | PRN
  Administered 2014-12-07 (×2): 50 ug via INTRAVENOUS

## 2014-12-07 MED ORDER — CEFAZOLIN SODIUM-DEXTROSE 2-3 GM-% IV SOLR
INTRAVENOUS | Status: AC
  Filled 2014-12-07: qty 50

## 2014-12-07 MED ORDER — SODIUM CHLORIDE 0.9 % IV SOLN
INTRAVENOUS | Status: DC
  Administered 2014-12-07: 17:00:00 via INTRAVENOUS

## 2014-12-07 SURGICAL SUPPLY — 43 items
APL SKNCLS STERI-STRIP NONHPOA (GAUZE/BANDAGES/DRESSINGS) ×2
ATTRACTOMAT 16X20 MAGNETIC DRP (DRAPES) ×4 IMPLANT
BENZOIN TINCTURE PRP APPL 2/3 (GAUZE/BANDAGES/DRESSINGS) ×4 IMPLANT
BLADE HEX COATED 2.75 (ELECTRODE) ×4 IMPLANT
BLADE SURG 15 STRL LF DISP TIS (BLADE) ×2 IMPLANT
BLADE SURG 15 STRL SS (BLADE) ×4
CANISTER SUCT 3000ML (MISCELLANEOUS) ×4 IMPLANT
CHLORAPREP W/TINT 10.5 ML (MISCELLANEOUS) ×6 IMPLANT
CLIP TI MEDIUM 6 (CLIP) ×8 IMPLANT
CLIP TI WIDE RED SMALL 6 (CLIP) ×8 IMPLANT
CLOSURE WOUND 1/2 X4 (GAUZE/BANDAGES/DRESSINGS) ×1
DISSECTOR ROUND CHERRY 3/8 STR (MISCELLANEOUS) IMPLANT
DRAPE PED LAPAROTOMY (DRAPES) ×4 IMPLANT
DRESSING SURGICEL FIBRLLR 1X2 (HEMOSTASIS) ×2 IMPLANT
DRSG SURGICEL FIBRILLAR 1X2 (HEMOSTASIS) ×4
ELECT REM PT RETURN 9FT ADLT (ELECTROSURGICAL) ×4
ELECTRODE REM PT RTRN 9FT ADLT (ELECTROSURGICAL) ×2 IMPLANT
GAUZE SPONGE 4X4 12PLY STRL (GAUZE/BANDAGES/DRESSINGS) ×2 IMPLANT
GAUZE SPONGE 4X4 16PLY XRAY LF (GAUZE/BANDAGES/DRESSINGS) ×4 IMPLANT
GLOVE SURG ORTHO 8.0 STRL STRW (GLOVE) ×4 IMPLANT
GOWN STRL REUS W/TWL LRG LVL3 (GOWN DISPOSABLE) ×4 IMPLANT
GOWN STRL REUS W/TWL XL LVL3 (GOWN DISPOSABLE) ×12 IMPLANT
KIT BASIN OR (CUSTOM PROCEDURE TRAY) ×4 IMPLANT
NDL HYPO 25X1 1.5 SAFETY (NEEDLE) ×2 IMPLANT
NEEDLE HYPO 25X1 1.5 SAFETY (NEEDLE) ×4 IMPLANT
NS IRRIG 1000ML POUR BTL (IV SOLUTION) ×4 IMPLANT
PACK BASIC VI WITH GOWN DISP (CUSTOM PROCEDURE TRAY) ×4 IMPLANT
PENCIL BUTTON HOLSTER BLD 10FT (ELECTRODE) ×4 IMPLANT
SHEARS HARMONIC 9CM CVD (BLADE) ×4 IMPLANT
STAPLER VISISTAT 35W (STAPLE) ×4 IMPLANT
STRIP CLOSURE SKIN 1/2X4 (GAUZE/BANDAGES/DRESSINGS) ×3 IMPLANT
SUT MNCRL AB 4-0 PS2 18 (SUTURE) ×4 IMPLANT
SUT SILK 2 0 (SUTURE)
SUT SILK 2-0 18XBRD TIE 12 (SUTURE) ×2 IMPLANT
SUT SILK 3 0 (SUTURE) ×4
SUT SILK 3-0 18XBRD TIE 12 (SUTURE) IMPLANT
SUT VIC AB 3-0 SH 18 (SUTURE) ×4 IMPLANT
SYR BULB IRRIGATION 50ML (SYRINGE) ×4 IMPLANT
SYR CONTROL 10ML LL (SYRINGE) ×4 IMPLANT
TAPE CLOTH SURG 4X10 WHT LF (GAUZE/BANDAGES/DRESSINGS) ×2 IMPLANT
TOWEL OR 17X26 10 PK STRL BLUE (TOWEL DISPOSABLE) ×4 IMPLANT
TOWEL OR NON WOVEN STRL DISP B (DISPOSABLE) ×4 IMPLANT
YANKAUER SUCT BULB TIP 10FT TU (MISCELLANEOUS) ×4 IMPLANT

## 2014-12-07 NOTE — H&P (Signed)
[facility]  Cassandra Baker Patient #: [id number] DOB: 1941-11-22 Married / Language: Cleophus Molt / Race: White Female  History of Present Illness Patient words: evaluate hyperthyroidism.  The patient is a 73 year old female who presents with a parathyroid neoplasm.  Patient is referred by Dr. Edrick Oh. Patient's primary care physician is Dr. Seward Carol.  Patient is referred for evaluation of suspected primary hyperparathyroidism.  Patient was diagnosed with chronic kidney disease in August 2015. Part of her evaluation included laboratory studies showing a mildly elevated calcium level of 10.4. Subsequent intact PTH level was measured at 246. 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 Patient has a history of hypothyroidism and takes thyroid hormone replacement. She has had no studies on her thyroid.  Patient notes chronic fatigue, nausea, and has had bilateral wrist and ankle fractures after falls in the past few years. She denies nephrolithiasis.  There is no family history of endocrine disease or endocrine neoplasms.  Patient has had no surgery on the head or neck.   Other Problems  Back Pain Chronic Renal Failure Syndrome Congestive Heart Failure Depression Diabetes Mellitus High blood pressure Melanoma Thyroid Disease  Past Surgical History Appendectomy Cataract Surgery Bilateral. Tonsillectomy  Diagnostic Studies History Colonoscopy 1-5 years ago Mammogram 1-3 years ago Pap Smear >5 years ago  Medication History  Lasix (40MG  Tablet, 2 Oral two times daily, as needed) Active. (per pt) Levothyroxine Sodium (25MCG Tablet, 1 Oral daily) Active. (per pt) Flonase Allergy Relief (Nasal) Specific dose unknown - Active. PROzac (10MG  Capsule, 1 Oral daily) Active. (per  pt) Aspirin EC (325MG  Tablet DR, 1 Oral daily) Active. (per pt) Altace (1 Oral daily) Specific dose unknown - Active. (per pt)  Social History Alcohol use Remotely quit alcohol use. Caffeine use Coffee. Illicit drug use Remotely quit drug use. Tobacco use Never smoker.  Family History  Arthritis Family Members In General, Father, Mother. Cerebrovascular Accident Mother. Cervical Cancer Family Members In General. Diabetes Mellitus Father. Heart Disease Brother, Father, Mother. Heart disease in female family member before age 31 Hypertension Father, Mother.  Pregnancy / Birth History Age at menarche 37 years. Age of menopause 64-50 Gravida 2 Maternal age 53-30 Para 1  Review of Systems General Present- Appetite Loss and Fatigue. Not Present- Chills, Fever, Night Sweats, Weight Gain and Weight Loss. Skin Present- Dryness. Not Present- Change in Wart/Mole, Hives, Jaundice, New Lesions, Non-Healing Wounds, Rash and Ulcer. HEENT Present- Ringing in the Ears, Seasonal Allergies and Wears glasses/contact lenses. Not Present- Earache, Hearing Loss, Hoarseness, Nose Bleed, Oral Ulcers, Sinus Pain, Sore Throat, Visual Disturbances and Yellow Eyes. Respiratory Present- Difficulty Breathing. Not Present- Bloody sputum, Chronic Cough, Snoring and Wheezing. Breast Not Present- Breast Mass, Breast Pain, Nipple Discharge and Skin Changes. Cardiovascular Present- Shortness of Breath and Swelling of Extremities. Not Present- Chest Pain, Difficulty Breathing Lying Down, Leg Cramps, Palpitations and Rapid Heart Rate. Gastrointestinal Present- Nausea. Not Present- Abdominal Pain, Bloating, Bloody Stool, Change in Bowel Habits, Chronic diarrhea, Constipation, Difficulty Swallowing, Excessive gas, Gets full quickly at meals, Hemorrhoids, Indigestion, Rectal Pain and Vomiting. Female Genitourinary Present- Frequency. Not Present- Nocturia, Painful Urination, Pelvic Pain and  Urgency. Musculoskeletal Present- Back Pain and Joint Stiffness. Not Present- Joint Pain, Muscle Pain, Muscle Weakness and Swelling of Extremities. Neurological Present- Tingling and Weakness. Not Present- Decreased Memory, Fainting, Headaches, Numbness, Seizures, Tremor  and Trouble walking. Psychiatric Present- Depression. Not Present- Anxiety, Bipolar, Change in Sleep Pattern, Fearful and Frequent crying. Endocrine Not Present- Cold Intolerance, Excessive Hunger, Hair Changes, Heat Intolerance, Hot flashes and New Diabetes. Hematology Not Present- Easy Bruising, Excessive bleeding, Gland problems, HIV and Persistent Infections.   Vitals  10/09/2014 9:12 AM Weight: 189 lb Height: 64in Body Surface Area: 1.97 m Body Mass Index: 32.44 kg/m Temp.: 98.62F(Oral)  Pulse: 88 (Regular)  Resp.: 88 (Unlabored)  BP: 134/80 (Sitting, Left Arm, Standard)    Physical Exam The physical exam findings are as follows: Note:General - appears comfortable, no distress; not diaphorectic  HEENT - normocephalic; sclerae clear, gaze conjugate; mucous membranes moist, dentition good; voice normal  Neck - asymmetric on extension; no palpable anterior or posterior cervical adenopathy; The left thyroid lobe contained safe smooth firm nodule measuring approximately 2.5 cm in diameter; the isthmus is prominent and may contain a nodule; the right lobe is slightly firm without palpable mass  Chest - clear bilaterally with rhonchi, rales, or wheeze  Cor - regular rhythm with normal rate; no significant murmur  Ext - non-tender without significant edema or lymphedema  Neuro - grossly intact; no tremor    Assessment & Plan 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 I have provided her with written literature to review at home.  Patient likely has primary hyperparathyroidism and the nuclear medicine scan suggests a  left inferior parathyroid adenoma.  Patient and I discussed minimally invasive surgery for parathyroidectomy. We also discussed the possibility of neck exploration or even the possibility of thyroid surgery concurrently. She understands. We will be better able to predict the needed operative intervention once her ultrasound study is complete.  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  Plan left thyroid lobectomy for definitive diagnosis.  The risks and benefits of the procedure have been discussed at length with the patient.  The patient understands the proposed procedure, potential alternative treatments, and the course of recovery to be expected.  All of the patient's questions have been answered at this time.  The patient wishes to proceed with surgery.  Earnstine Regal, MD, [facility] Office: [phone]

## 2014-12-07 NOTE — Brief Op Note (Signed)
12/07/2014  3:13 PM  PATIENT:  Cassandra Baker  73 y.o. female  PRE-OPERATIVE DIAGNOSIS:  THYROID NEOPLASM OF UNCERTAIN BEHAVIOR, PRIMARY HYPERPARATHYROIDISM  POST-OPERATIVE DIAGNOSIS:  same   PROCEDURE:  Procedure(s): LEFT THYROID LOBECTOMY (Left) PARATHYROIDECTOMY (N/A)  SURGEON:  Surgeon(s) and Role:    * Armandina Gemma, MD - Primary  ANESTHESIA:   general  EBL:  Total I/O In: 1000 [I.V.:1000] Out: -   BLOOD ADMINISTERED:none  DRAINS: none   LOCAL MEDICATIONS USED:  NONE  SPECIMEN:  Excision  DISPOSITION OF SPECIMEN:  PATHOLOGY  COUNTS:  YES  TOURNIQUET:  * No tourniquets in log *  DICTATION: .Other Dictation: Dictation Number [id number]  PLAN OF CARE: Admit for overnight observation  PATIENT DISPOSITION:  PACU - hemodynamically stable.   Delay start of Pharmacological VTE agent (>24hrs) due to surgical blood loss or risk of bleeding: yes  Earnstine Regal, MD, [facility] Office: [phone]

## 2014-12-07 NOTE — Anesthesia Preprocedure Evaluation (Addendum)
Anesthesia Evaluation  Patient identified by MRN, date of birth, ID band Patient awake    Reviewed: Allergy & Precautions, H&P , NPO status , Patient's Chart, lab work & pertinent test results  Airway Mallampati: II  TM Distance: >3 FB Neck ROM: Full    Dental no notable dental hx.    Pulmonary neg pulmonary ROS, asthma ,  breath sounds clear to auscultation  Pulmonary exam normal       Cardiovascular hypertension, Pt. on medications +CHF negative cardio ROS  Rhythm:Regular Rate:Normal     Neuro/Psych negative neurological ROS  negative psych ROS   GI/Hepatic negative GI ROS, Neg liver ROS,   Endo/Other  diabetes  Renal/GU negative Renal ROS  negative genitourinary   Musculoskeletal negative musculoskeletal ROS (+)   Abdominal   Peds negative pediatric ROS (+)  Hematology negative hematology ROS (+)   Anesthesia Other Findings   Reproductive/Obstetrics negative OB ROS                            Anesthesia Physical Anesthesia Plan  ASA: III  Anesthesia Plan: General   Post-op Pain Management:    Induction: Intravenous  Airway Management Planned: Oral ETT  Additional Equipment:   Intra-op Plan:   Post-operative Plan: Extubation in OR  Informed Consent: I have reviewed the patients History and Physical, chart, labs and discussed the procedure including the risks, benefits and alternatives for the proposed anesthesia with the patient or authorized representative who has indicated his/her understanding and acceptance.   Dental advisory given  Plan Discussed with: CRNA  Anesthesia Plan Comments:         Anesthesia Quick Evaluation

## 2014-12-07 NOTE — Transfer of Care (Signed)
Immediate Anesthesia Transfer of Care Note  Patient: Cassandra Baker  Procedure(s) Performed: Procedure(s): LEFT THYROID LOBECTOMY (Left) PARATHYROIDECTOMY (N/A)  Patient Location: PACU  Anesthesia Type:General  Level of Consciousness: sedated  Airway & Oxygen Therapy: Patient Spontanous Breathing and Patient connected to face mask oxygen  Post-op Assessment: Report given to PACU RN  Post vital signs: Reviewed and stable  Complications: No apparent anesthesia complications

## 2014-12-08 ENCOUNTER — Encounter (HOSPITAL_COMMUNITY): Payer: Self-pay | Admitting: Surgery

## 2014-12-08 DIAGNOSIS — E210 Primary hyperparathyroidism: Secondary | ICD-10-CM | POA: Diagnosis not present

## 2014-12-08 LAB — BASIC METABOLIC PANEL
Anion gap: 17 — ABNORMAL HIGH (ref 5–15)
BUN: 63 mg/dL — ABNORMAL HIGH (ref 6–23)
CO2: 20 mEq/L (ref 19–32)
Calcium: 10 mg/dL (ref 8.4–10.5)
Chloride: 105 mEq/L (ref 96–112)
Creatinine, Ser: 2.15 mg/dL — ABNORMAL HIGH (ref 0.50–1.10)
GFR calc Af Amer: 25 mL/min — ABNORMAL LOW (ref >90)
GFR, EST NON AFRICAN AMERICAN: 21 mL/min — AB (ref >90)
Glucose, Bld: 174 mg/dL — ABNORMAL HIGH (ref 70–99)
POTASSIUM: 4.9 meq/L (ref 3.7–5.3)
SODIUM: 142 meq/L (ref 137–147)

## 2014-12-08 MED ORDER — HYDROCODONE-ACETAMINOPHEN 5-325 MG PO TABS: 1.0000 | ORAL_TABLET | ORAL | Status: DC | PRN

## 2014-12-08 NOTE — Progress Notes (Signed)
UR completed 

## 2014-12-08 NOTE — Discharge Summary (Signed)
Physician Discharge Summary [facility]  Patient ID: Cassandra Baker MRN: [id number] DOB/AGE: [birth year] 74 y.o.  Admit date: 12/07/2014 Discharge date: 12/08/2014  Admission Diagnoses:  Primary hyperparathyroidism, thyroid neoplasm of uncertain behavior  Discharge Diagnoses:  Principal Problem:   Hyperparathyroidism, primary Active Problems:   Neoplasm of uncertain behavior of thyroid gland, left lobe   Primary hyperparathyroidism   Discharged Condition: good  Hospital Course: Patient was admitted for observation following thyroid surgery.  Post op course was uncomplicated.  Pain was well controlled.  Tolerated diet.  Post op calcium level on morning following surgery was 10.0 mg/dl.  Patient was prepared for discharge home on POD#1.  Consults: None  Treatments: surgery: left thyroid lobectomy, parathyroidectomy  Discharge Exam: Blood pressure 140/50, pulse 86, temperature 98.9 F (37.2 C), temperature source Oral, resp. rate 16, height 5\' 4"  (1.626 m), weight 189 lb 9.6 oz (86.002 kg), SpO2 97 %. HEENT - clear Neck - wound dry and intact, mild STS; voice normal Chest - clear bilaterally Cor - RRR   Disposition: Home  Discharge Instructions    Apply dressing    Complete by:  As directed   Apply light gauze dressing to wound before discharge home today. Earnstine Regal, MD, [facility] Office: [phone]     Diet - low sodium heart healthy    Complete by:  As directed      Discharge instructions    Complete by:  As directed   THYROID & PARATHYROID SURGERY - POST OP INSTRUCTIONS  Always review your discharge instruction sheet from the facility where your surgery was performed.  A prescription for pain medication may be given to you upon discharge.  Take your pain medication as prescribed.  If narcotic pain medicine is not needed, then you may take acetaminophen (Tylenol) or ibuprofen (Advil) as needed.  Take your usually  prescribed medications unless otherwise directed.  If you need a refill on your pain medication, please contact your pharmacy. They will contact our office to request authorization.  Prescriptions will not be processed after 5 pm or on weekends.  Start with a light diet upon arrival home, such as soup and crackers or toast.  Be sure to drink plenty of fluids daily.  Resume your normal diet the day after surgery.  Most patients will experience some swelling and bruising on the chest and neck area.  Ice packs will help.  Swelling and bruising can take several days to resolve.   It is common to experience some constipation if taking pain medication after surgery.  Increasing fluid intake and taking a stool softener will usually help or prevent this problem.  A mild laxative (Milk of Magnesia or Miralax) should be taken according to package directions if there are no bowel movements after 48 hours.  You may remove your bandages 24-48 hours after surgery, and you may shower at that time.  You have steri-strips (small skin tapes) in place directly over the incision.  These strips should be left on the skin for 7-10 days and then removed.  You may resume regular (light) daily activities beginning the next day-such as daily self-care, walking, climbing stairs-gradually increasing activities as tolerated.  You may have sexual intercourse when it is comfortable.  Refrain from any heavy lifting or straining until approved by your doctor.  You may drive when you no longer are taking prescription pain medication, you can comfortably wear a seatbelt, and you can safely maneuver your car  and apply brakes.  You should see your doctor in the office for a follow-up appointment approximately two to three weeks after your surgery.  Make sure that you call for this appointment within a day or two after you arrive home to insure a convenient appointment time.  WHEN TO CALL YOUR DOCTOR: -- Fever greater than 101.5 --  Inability to urinate -- Nausea and/or vomiting - persistent -- Extreme swelling or bruising -- Continued bleeding from incision -- Increased pain, redness, or drainage from the incision -- Difficulty swallowing or breathing -- Muscle cramping or spasms -- Numbness or tingling in hands or around lips  The clinic staff is available to answer your questions during regular business hours.  Please don't hesitate to call and ask to speak to one of the nurses if you have concerns.  Earnstine Regal, MD, [facility] Office: [phone]     Increase activity slowly    Complete by:  As directed      Remove dressing in 24 hours    Complete by:  As directed             Medication List    TAKE these medications        FLUoxetine 10 MG capsule  Commonly known as:  PROZAC  Take 10 mg by mouth at bedtime.     fluticasone 50 MCG/ACT nasal spray  Commonly known as:  FLONASE  Place 2 sprays into both nostrils daily.     furosemide 40 MG tablet  Commonly known as:  LASIX  Take 80 mg by mouth 2 (two) times daily.     HYDROcodone-acetaminophen 5-325 MG per tablet  Commonly known as:  NORCO/VICODIN  Take 1-2 tablets by mouth every 4 (four) hours as needed for moderate pain.     levothyroxine 100 MCG tablet  Commonly known as:  SYNTHROID, LEVOTHROID  Take 100 mcg by mouth daily before breakfast.     ramipril 10 MG capsule  Commonly known as:  ALTACE  Take 10 mg by mouth at bedtime.     Vitamin D 2000 UNITS tablet  Take 2,000 Units by mouth daily.           Follow-up Information    Follow up with Earnstine Regal, MD. Schedule an appointment as soon as possible for a visit in 3 weeks.   Specialty:  General Surgery   Why:  For wound re-check   Contact information:   [place] [address] [phone]       Earnstine Regal, MD, [facility] Office:  [phone]   Signed: Earnstine Regal 12/08/2014, 8:48 AM

## 2014-12-08 NOTE — Anesthesia Postprocedure Evaluation (Signed)
  Anesthesia Post-op Note  Patient: Cassandra Baker  Procedure(s) Performed: Procedure(s) (LRB): LEFT THYROID LOBECTOMY (Left) PARATHYROIDECTOMY (N/A)  Patient Location: PACU  Anesthesia Type: General  Level of Consciousness: awake and alert   Airway and Oxygen Therapy: Patient Spontanous Breathing  Post-op Pain: mild  Post-op Assessment: Post-op Vital signs reviewed, Patient's Cardiovascular Status Stable, Respiratory Function Stable, Patent Airway and No signs of Nausea or vomiting  Last Vitals:  Filed Vitals:   12/08/14 0514  BP: 140/50  Pulse: 86  Temp: 37.2 C  Resp: 16    Post-op Vital Signs: stable   Complications: No apparent anesthesia complications

## 2014-12-08 NOTE — Op Note (Signed)
NAMEJAMAR, Cassandra Baker NO.:  [id number]  MEDICAL RECORD NO.:  [id number]  LOCATION:  37                         FACILITY:  [facility]  PHYSICIAN:  Earnstine Regal, MD      DATE OF BIRTH:  Sep 15, 1941  DATE OF PROCEDURE:  12/07/2014                              OPERATIVE REPORT   PREOPERATIVE DIAGNOSES: 1. Primary hyperparathyroidism. 2. Left thyroid nodule with cytologic atypia.  POSTOPERATIVE DIAGNOSES: 1. Primary hyperparathyroidism. 2. Left thyroid nodule with cytologic atypia.  PROCEDURES: 1. Left superior parathyroidectomy. 2. Left thyroid lobectomy.  SURGEON:  Earnstine Regal, MD, FACS  ANESTHESIA:  General.  ESTIMATED BLOOD LOSS:  Minimal.  PREPARATION:  ChloraPrep.  COMPLICATIONS:  None.  INDICATIONS:  The patient is a 73 year old female referred by her nephrologist, Dr. Edrick Oh, with primary hyperparathyroidism. Calcium level was elevated at 10.4.  Intact PTH level was elevated at 246.  Nuclear Medicine parathyroid scan localized as an adenoma to the left neck.  On exam, the patient had a substantial left thyroid nodule. She underwent thyroid ultrasound followed by ultrasound-guided fine- needle aspiration biopsy.  This showed a left thyroid nodule with cytologic atypia consistent with a thyroid neoplasm of uncertain behavior.  The patient now comes to Surgery for parathyroidectomy and left thyroid lobectomy for definitive diagnosis.  BODY OF REPORT:  Procedure was done in OR #1 at the [facility].  The patient was brought to the operating room, placed in a supine position on the operating room table.  Following administration of general anesthesia, the patient was positioned and then prepped and draped in the usual aseptic fashion.  After ascertaining that, an adequate level of anesthesia had been achieved, a Kocher incision was made with a #15 blade.  Dissection was carried through the subcutaneous tissues and platysma.   Hemostasis was achieved with the electrocautery.  Skin flaps were elevated cephalad and caudad from the thyroid notch to the sternal notch.  A Mahorner self-retaining retractor was placed for exposure.  Strap muscles were incised in the midline.  Dissection was begun on the left side.  Strap muscles were reflected laterally.  Left thyroid lobe was quite hard, firm, and appeared inflammatory.  Strap muscles were somewhat adherent to the capsule of the thyroid.  With some difficulty, the left lobe was dissected out with gentle blunt dissection.  Superior pole was dissected out and superior pole vessels divided between small and medium Ligaclips with the Harmonic scalpel.  Inferior venous tributaries were also divided between Ligaclips with the Harmonic scalpel.  Gland was rolled anteriorly.  Middle thyroid vein was divided between Ligaclips. Branches of the inferior thyroid artery were divided between small Ligaclips with the Harmonic scalpel.  The superior parathyroid gland was identified and was moderately enlarged.  It measured at least 2 cm to 2.5 cm in greatest dimension.  It was gently dissected out.  Vascular pedicle was divided between Ligaclips with the Harmonic scalpel and the gland was submitted to Pathology for review.  Pathology showed a hypercellular parathyroid gland weighing over 2 g.  This was consistent with parathyroid adenoma.  Remaining branches of the inferior thyroid artery were divided.  Care was  taken to avoid injuring the recurrent laryngeal nerve.  Ligament of Gwenlyn Found was released with the electrocautery and the gland was rolled onto the anterior trachea.  Thyroid was transected at the midpoint of the isthmus using the Harmonic scalpel.  Left thyroid lobe was then submitted separately to Pathology for permanent review.  Palpation of the right thyroid lobe showed it also be quite firm consistent with underlying thyroiditis.  Left neck was irrigated with warm  saline.  Good hemostasis was achieved throughout the operative field.  Fibrillar was placed throughout the operative field.  Strap muscles were reapproximated in the midline with interrupted 3-0 Vicryl sutures.  Platysma was closed with interrupted 3- 0 Vicryl sutures.  Skin was closed with a running 4-0 Monocryl subcuticular suture.  Wound was washed and dried, and benzoin and Steri- Strips were applied.  Sterile dressings were applied.  The patient was awakened from anesthesia and brought to the recovery room.  The patient tolerated the procedure well.   Earnstine Regal, MD, [facility] Office: [phone]   TMG/MEDQ  D:  12/07/2014  T:  12/08/2014  Job:  [id number]  cc:   Sherril Croon, M.D. Fax: [phone]  Ashby Dawes. Polite, M.D. Fax: [phone]

## 2014-12-11 NOTE — Progress Notes (Signed)
Quick Note:  Please contact patient and notify of benign pathology results.  Cassandra Boulay M. Jahmiyah Dullea, MD, [facility] Office: [phone]   ______ 

## 2014-12-29 DIAGNOSIS — E039 Hypothyroidism, unspecified: Secondary | ICD-10-CM | POA: Diagnosis not present

## 2014-12-29 DIAGNOSIS — E213 Hyperparathyroidism, unspecified: Secondary | ICD-10-CM | POA: Diagnosis not present

## 2015-01-02 DIAGNOSIS — E119 Type 2 diabetes mellitus without complications: Secondary | ICD-10-CM | POA: Diagnosis not present

## 2015-01-11 DIAGNOSIS — E1165 Type 2 diabetes mellitus with hyperglycemia: Secondary | ICD-10-CM | POA: Diagnosis not present

## 2015-01-11 DIAGNOSIS — E890 Postprocedural hypothyroidism: Secondary | ICD-10-CM | POA: Diagnosis not present

## 2015-01-11 DIAGNOSIS — I10 Essential (primary) hypertension: Secondary | ICD-10-CM | POA: Diagnosis not present

## 2015-02-06 ENCOUNTER — Encounter: Payer: Self-pay | Admitting: *Deleted

## 2015-02-06 ENCOUNTER — Encounter: Payer: Medicare Other | Attending: Endocrinology | Admitting: *Deleted

## 2015-02-06 DIAGNOSIS — E119 Type 2 diabetes mellitus without complications: Secondary | ICD-10-CM | POA: Insufficient documentation

## 2015-02-06 DIAGNOSIS — Z713 Dietary counseling and surveillance: Secondary | ICD-10-CM | POA: Diagnosis not present

## 2015-02-06 NOTE — Progress Notes (Signed)
Diabetes Self-Management Education  Visit Type:  Initial Visit  Appt. Start Time: 1400 Appt. End Time: [id number]  02/06/2015  [name], identified by name and date of birth, is a 74 y.o. female with a diagnosis of Diabetes: Type 2.  Other people present during visit:  Patient. Patient presents primarily for glucose meter instruction. Patient brought her Accu-Chek Verio. I set the meter for date & time. Instruction was provided and glucose testing validated with Teach Back. Glucose reading 2hpp was 153mg /dl.  ASSESSMENT  There were no vitals taken for this visit. There is no weight on file to calculate BMI.  Initial Visit Information:  Are you taking your medications as prescribed?: Yes Are you checking your feet?: Yes How often do you need to have someone help you when you read instructions, pamphlets, or other written materials from your doctor or pharmacy?: 1 - Never  Psychosocial:  Patient Belief/Attitude about Diabetes: Motivated to manage diabetes Self-care barriers: None Self-management support: Doctor's office, CDE visits Other persons present: Patient Patient Concerns: Nutrition/Meal planning, Monitoring, Glycemic Control, Weight Control Special Needs: None Preferred Learning Style: No preference indicated Learning Readiness: Change in progress  Complications:   Last HgB A1C per patient/outside source: 7.3 mg/dL How often do you check your blood sugar?: 0 times/day (not testing) Postprandial Blood glucose range (mg/dL): 130-179 (02/06/15  153mg /dl)  Diet Intake:  Breakfast: english muffin, 1/2 C black beans balsamic vinegar Lunch: salade, chicken grilled, dinner roll  / [employer] corn, biscuit X2, grilled chicken, brown rice Snack (afternoon): nuts Dinner: oatmeal, blueberries, cereal corn flakes 1 1/2C , milk 1%,, boiled eggs Snack (evening): english muffin/(peanut butter)  Exercise:  Exercise: ADL's  Individualized Plan for Diabetes Self-Management Training:    Learning Objective:  Patient will have a greater understanding of diabetes self-management. Patient education plan per assessed needs and concerns is to attend individual sessions      Education Topics Reviewed with Patient Today:  Definition of diabetes, type 1 and 2, and the diagnosis of diabetes Role of diet in the treatment of diabetes and the relationship between the three main macronutrients and blood glucose level, Food label reading, portion sizes and measuring food., Carbohydrate counting, Information on hints to eating out and maintain blood glucose control., Meal options for control of blood glucose level and chronic complications. Role of exercise on diabetes management, blood pressure control and cardiac health. Taught/evaluated SMBG meter., Purpose and frequency of SMBG. Taught treatment of hypoglycemia - the 15 rule., Discussed and identified patients' treatment of hyperglycemia. Relationship between chronic complications and blood glucose control, Assessed and discussed foot care and prevention of foot problems, Identified and discussed with patient  current chronic complications, Nephropathy, what it is, prevention of, the use of ACE, ARB's and early detection of through urine microalbumia.  PATIENTS GOALS/Plan (Developed by the patient):  Nutrition: General guidelines for healthy choices and portions discussed Physical Activity: 30 minutes per day (gradual increase of exercise to an ultimate goal of 150 minutes per week) Medications: take my medication as prescribed Monitoring : test my blood glucose as discussed (note x per day with comment) (1-2 times daily FBS & 2hpp)   Patient Instructions  Plan:  Aim for 2-3 Carb Choices per meal (30-45 grams) +/- 1 either way  Aim for 0-15 Carbs per snack if hungry  Include protein in moderation with your meals and snacks Consider reading food labels for Total Carbohydrate and Fat Grams of foods Consider  increasing your activity  level by walking  for 30 minutes daily as tolerated Consider checking BG at alternate times to include fasting (before breakfast) and 2 hours after your largest meal per day as directed by MD  Continue taking medication  as directed by MD  Talk to physician about getting back on cholesterol medication.  Obtain glucose tablets to keep with you and at your bedside.   Expected Outcomes:     Education material provided: Living Well with Diabetes, A1C conversion sheet, Meal plan card, My Plate and Snack sheet  If problems or questions, patient to contact team via:  Phone and Email  Future DSME appointment: PRN

## 2015-02-06 NOTE — Patient Instructions (Addendum)
Plan:  Aim for 2-3 Carb Choices per meal (30-45 grams) +/- 1 either way  Aim for 0-15 Carbs per snack if hungry  Include protein in moderation with your meals and snacks Consider reading food labels for Total Carbohydrate and Fat Grams of foods Consider  increasing your activity level by walking for 30 minutes daily as tolerated Consider checking BG at alternate times to include fasting (before breakfast) and 2 hours after your largest meal per day as directed by MD  Continue taking medication  as directed by MD  Talk to physician about getting back on cholesterol medication.  Obtain glucose tablets to keep with you and at your bedside.

## 2015-02-22 DIAGNOSIS — E782 Mixed hyperlipidemia: Secondary | ICD-10-CM | POA: Diagnosis not present

## 2015-02-22 DIAGNOSIS — N183 Chronic kidney disease, stage 3 (moderate): Secondary | ICD-10-CM | POA: Diagnosis not present

## 2015-02-22 DIAGNOSIS — R5383 Other fatigue: Secondary | ICD-10-CM | POA: Diagnosis not present

## 2015-02-22 DIAGNOSIS — E213 Hyperparathyroidism, unspecified: Secondary | ICD-10-CM | POA: Diagnosis not present

## 2015-02-22 DIAGNOSIS — I10 Essential (primary) hypertension: Secondary | ICD-10-CM | POA: Diagnosis not present

## 2015-02-22 DIAGNOSIS — E039 Hypothyroidism, unspecified: Secondary | ICD-10-CM | POA: Diagnosis not present

## 2015-02-22 DIAGNOSIS — E1122 Type 2 diabetes mellitus with diabetic chronic kidney disease: Secondary | ICD-10-CM | POA: Diagnosis not present

## 2015-02-28 IMAGING — US US SOFT TISSUE HEAD/NECK
1 series · 14 of 25 positions shown · non-contrast
Comparison: None.

CLINICAL DATA: Palpable nodules on physical exam

EXAM:
THYROID ULTRASOUND
TECHNIQUE: Ultrasound examination of the thyroid gland and adjacent soft
tissues was performed.

[Series 1: us soft tissue head/neck · 0.08mm/px · 14 of 43 slices shown]
[im 1/43]
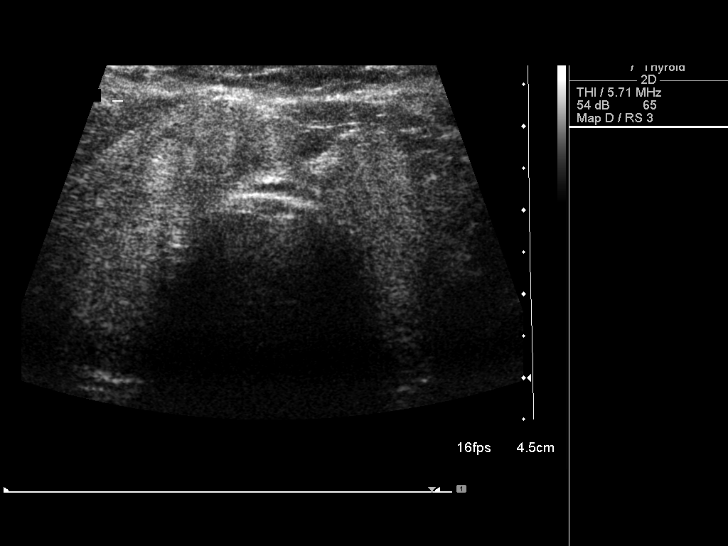
[im 4/43]
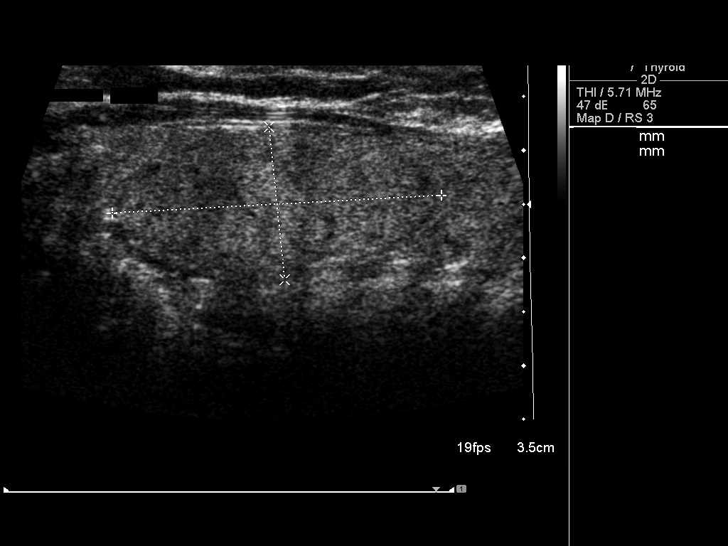
[im 8/43]
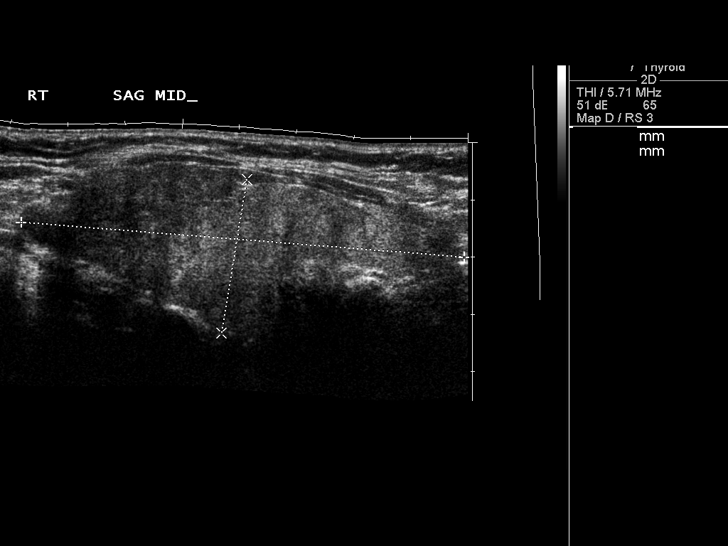
[im 11/43]
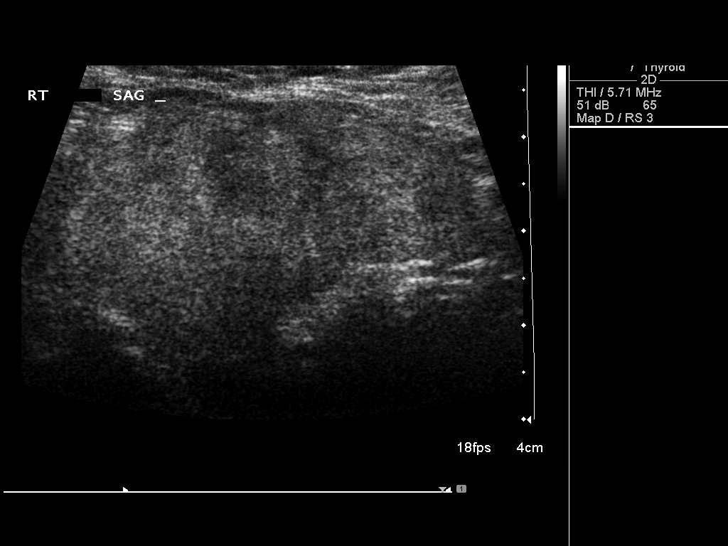
[im 15/43]
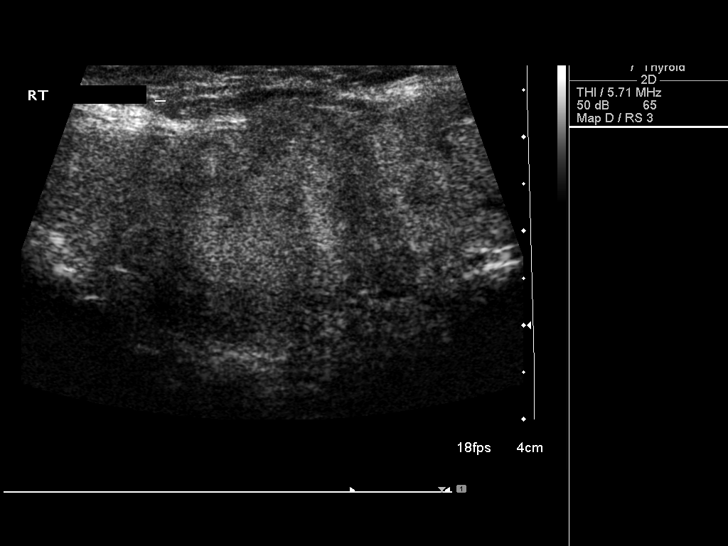
[im 16/43]
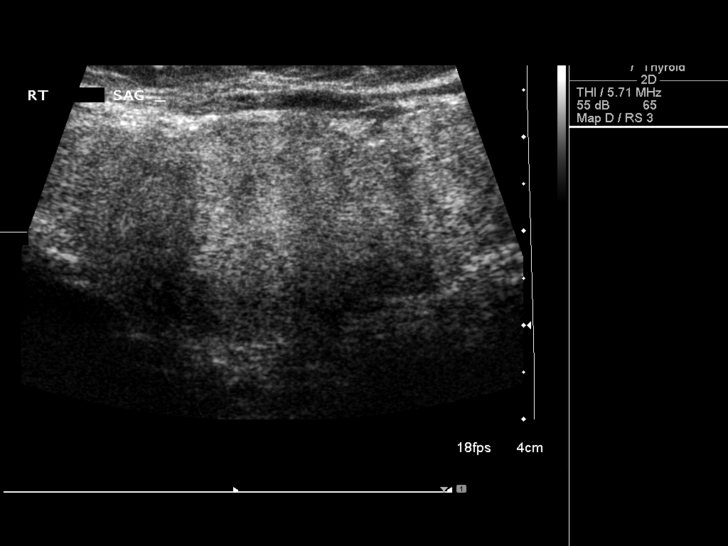
[im 20/43]
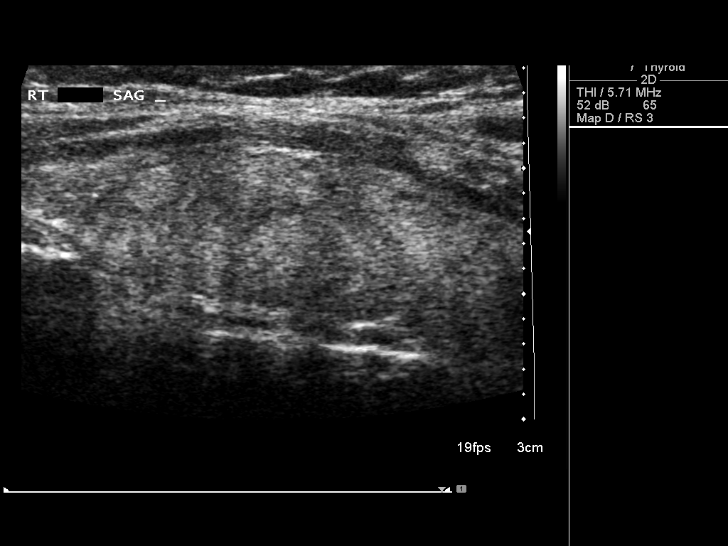
[im 23/43]
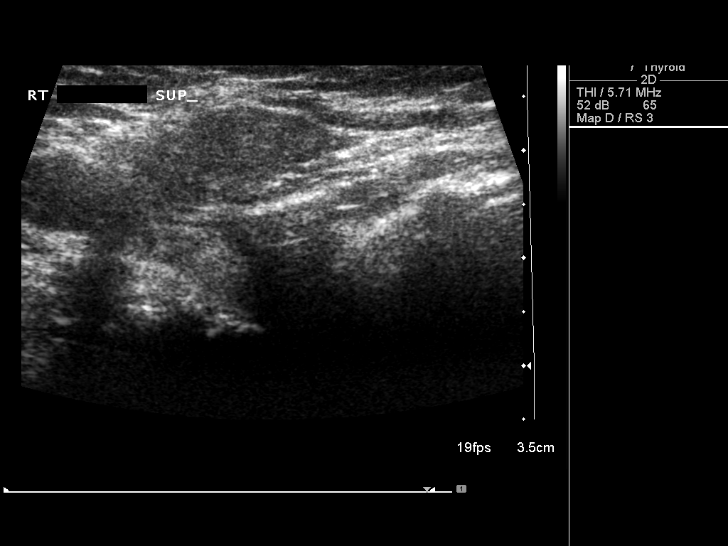
[im 27/43]
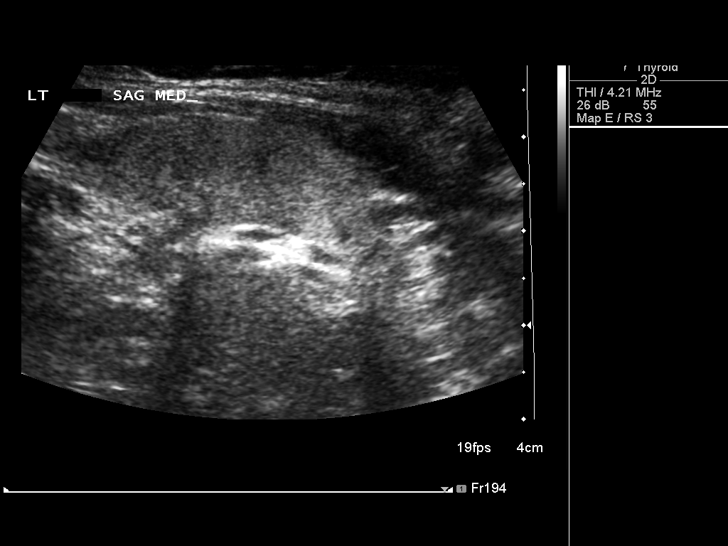
[im 29/43]
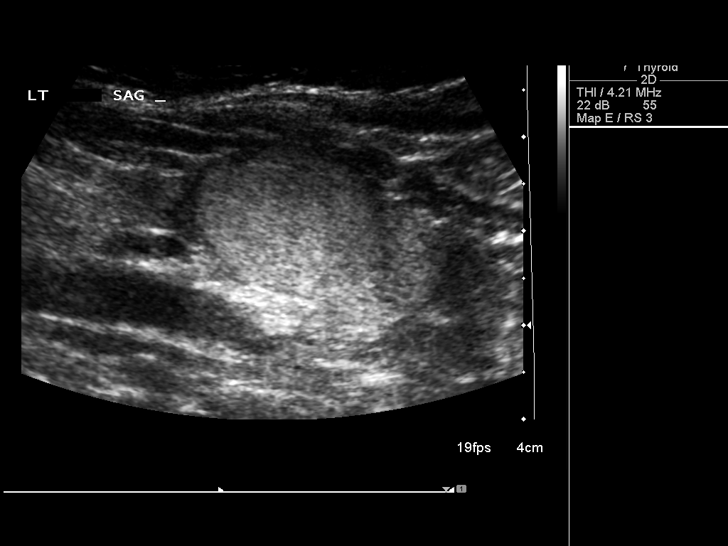
[im 32/43]
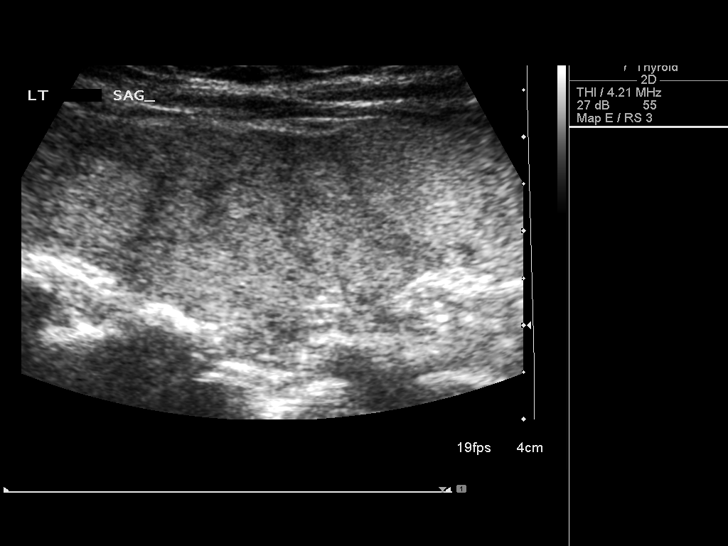
[im 36/43]
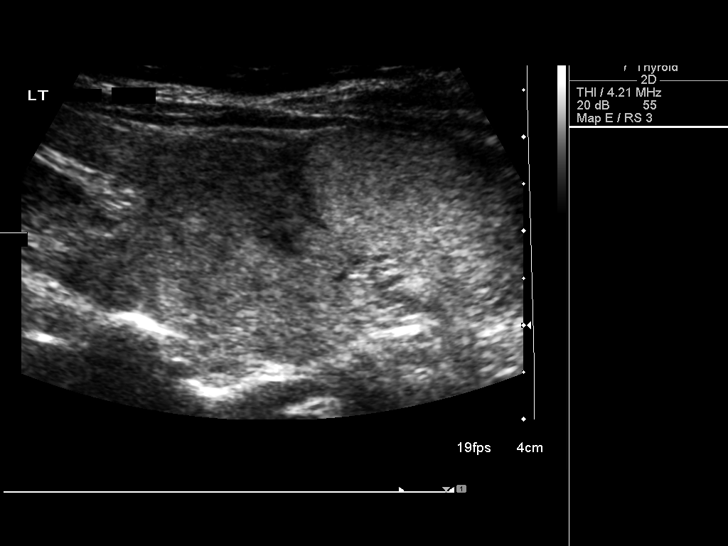
[im 39/43]
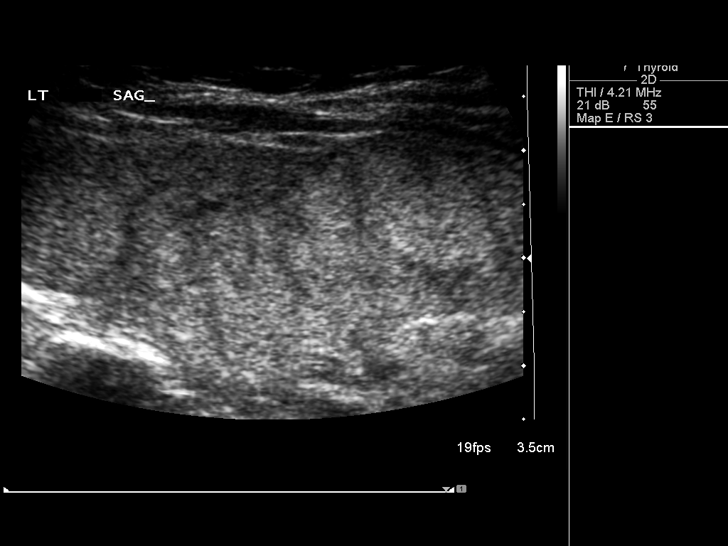
[im 43/43]
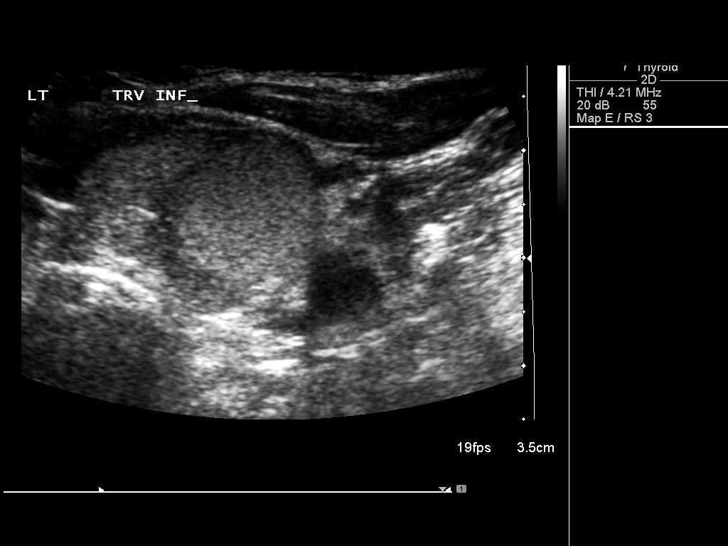

[14 of 25 positions shown; findings below may reference images not displayed]

FINDINGS: Right thyroid lobe

Measurements: 78 x 27 x 21 mm. 31 x 14 x 20 mm solid nodule at the
junction with the isthmus. Remainder of the background parenchyma is
diffusely inhomogeneous in echotexture.

Left thyroid lobe

Measurements: 62 x 31 x 14 mm.  No nodules visualized.

Isthmus

Thickness: 6 mm.  No nodules visualized.

Lymphadenopathy

None visualized.
IMPRESSION: 1. Thyromegaly with a solitary 3.1 cm right nodule. Findings meet
consensus criteria for biopsy. Ultrasound-guided fine needle
aspiration should be considered, as per the consensus statement:
Management of Thyroid Nodules Detected at US: Society of
Radiologists in Ultrasound Consensus Conference Statement. Radiology

## 2015-03-06 DIAGNOSIS — N189 Chronic kidney disease, unspecified: Secondary | ICD-10-CM | POA: Diagnosis not present

## 2015-03-06 DIAGNOSIS — D631 Anemia in chronic kidney disease: Secondary | ICD-10-CM | POA: Diagnosis not present

## 2015-03-06 DIAGNOSIS — N183 Chronic kidney disease, stage 3 (moderate): Secondary | ICD-10-CM | POA: Diagnosis not present

## 2015-03-06 DIAGNOSIS — E210 Primary hyperparathyroidism: Secondary | ICD-10-CM | POA: Diagnosis not present

## 2015-03-06 DIAGNOSIS — R809 Proteinuria, unspecified: Secondary | ICD-10-CM | POA: Diagnosis not present

## 2015-03-06 DIAGNOSIS — E039 Hypothyroidism, unspecified: Secondary | ICD-10-CM | POA: Diagnosis not present

## 2015-03-29 IMAGING — US US THYROID BIOPSY
1 series · 6 of 6 positions shown · non-contrast
Comparison: none

CLINICAL DATA: Thyromegaly with suspected 3.1 cm right isthmic
nodule as well as at lower pole left lobe nodule 2.2 cm.

EXAM:
ULTRASOUND-GUIDED THYROID ASPIRATION BIOPSY
TECHNIQUE: Upon further ultrasound interrogation, the right isthmic region was
not well defined in 3 planes and found to really represent a
pseudonodule in the setting of a nodular background thyroid
parenchyma. The slightly echogenic solid lower pole left nodule 22 x
16 x 15 mm which was not mentioned in the previous diagnostic report
was again localized.

[Series 1: us thyroid biopsy · 6 acquisitions, 6 frames shown]
[im 1/6]
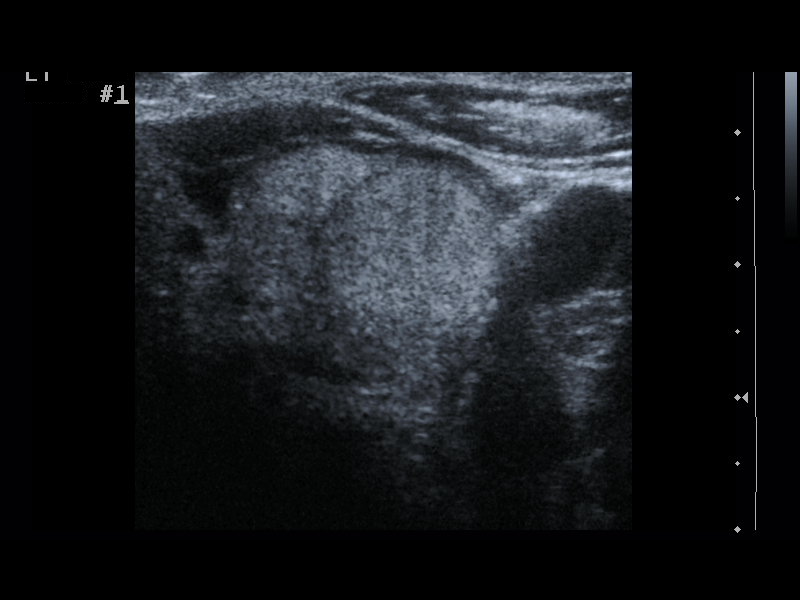
[im 2/6]
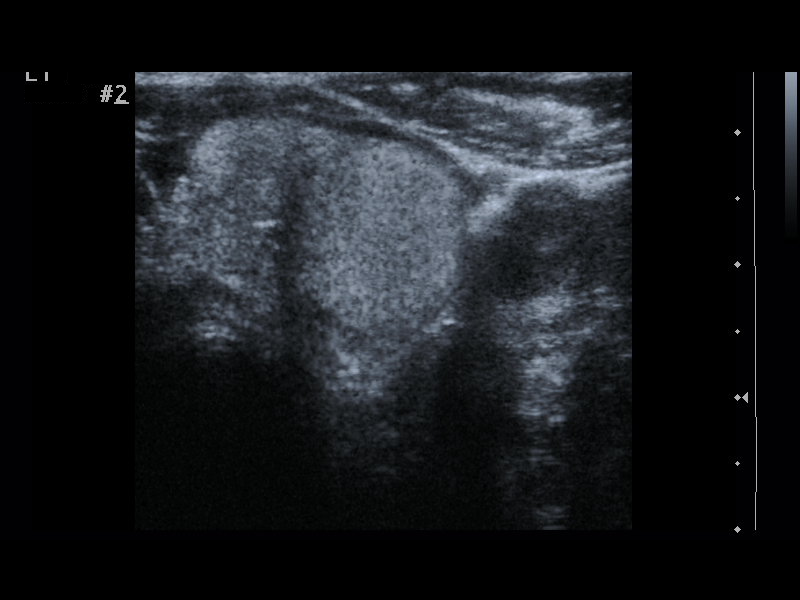
[im 3/6]
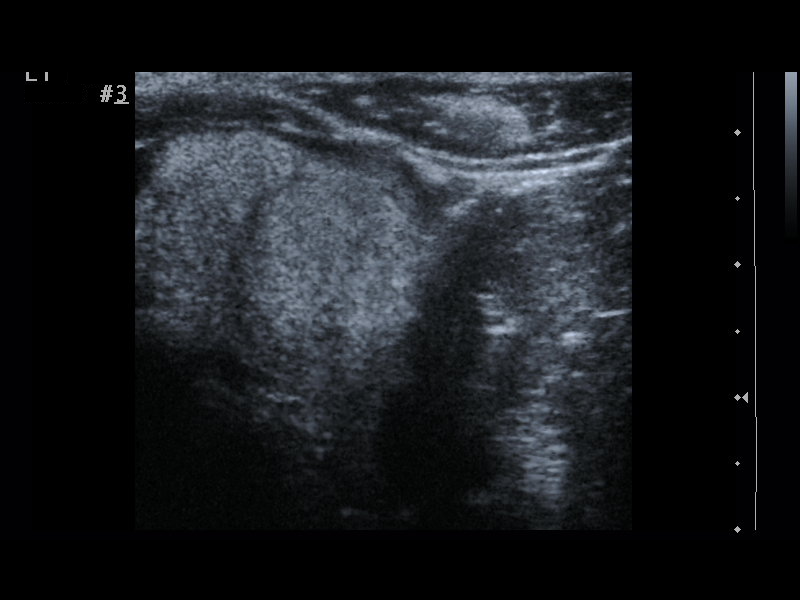
[im 4/6]
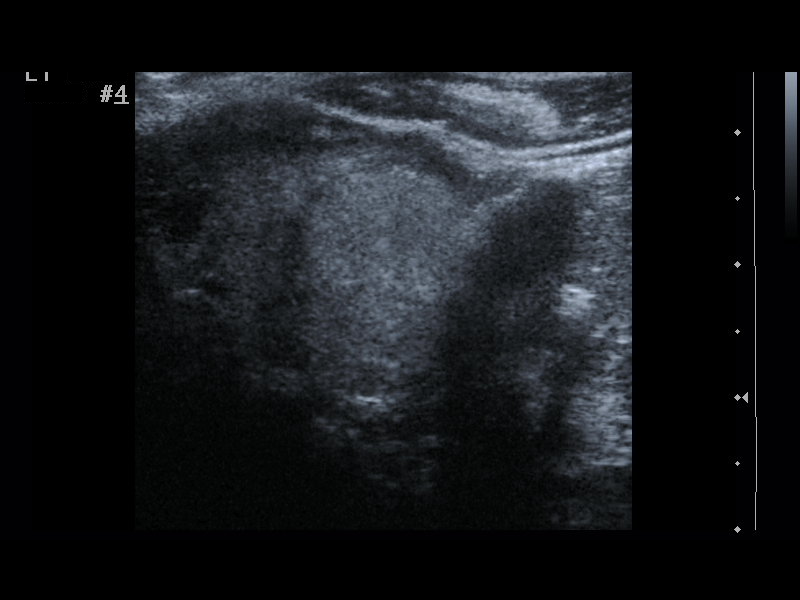
[im 5/6]
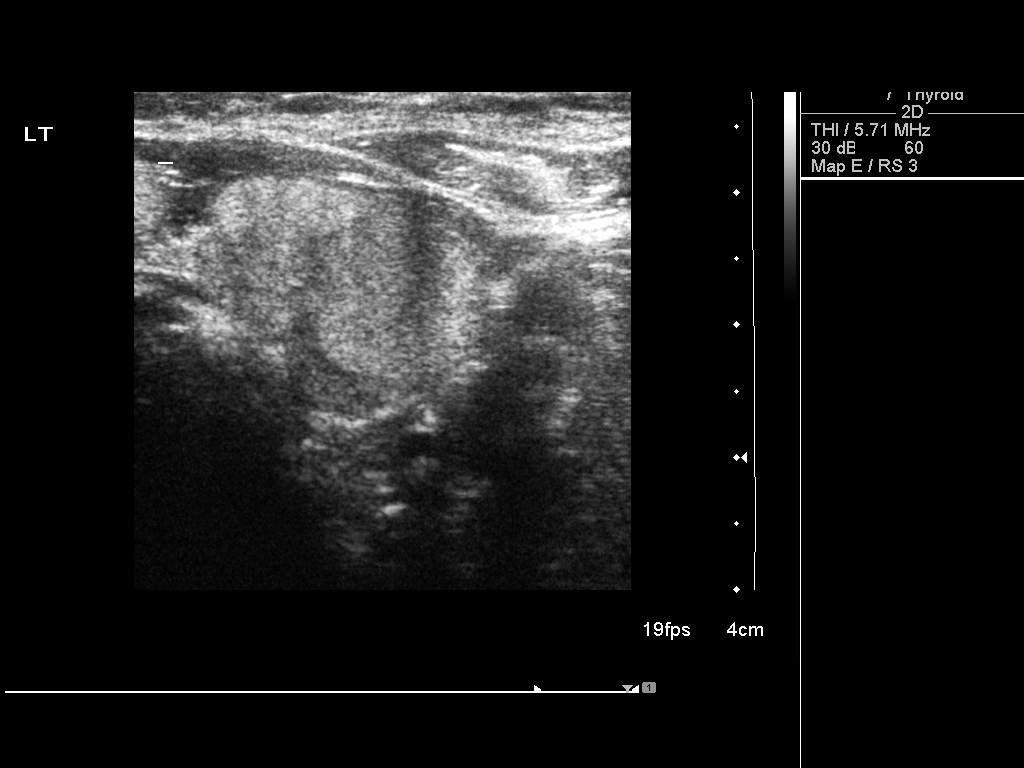
[im 6/6]
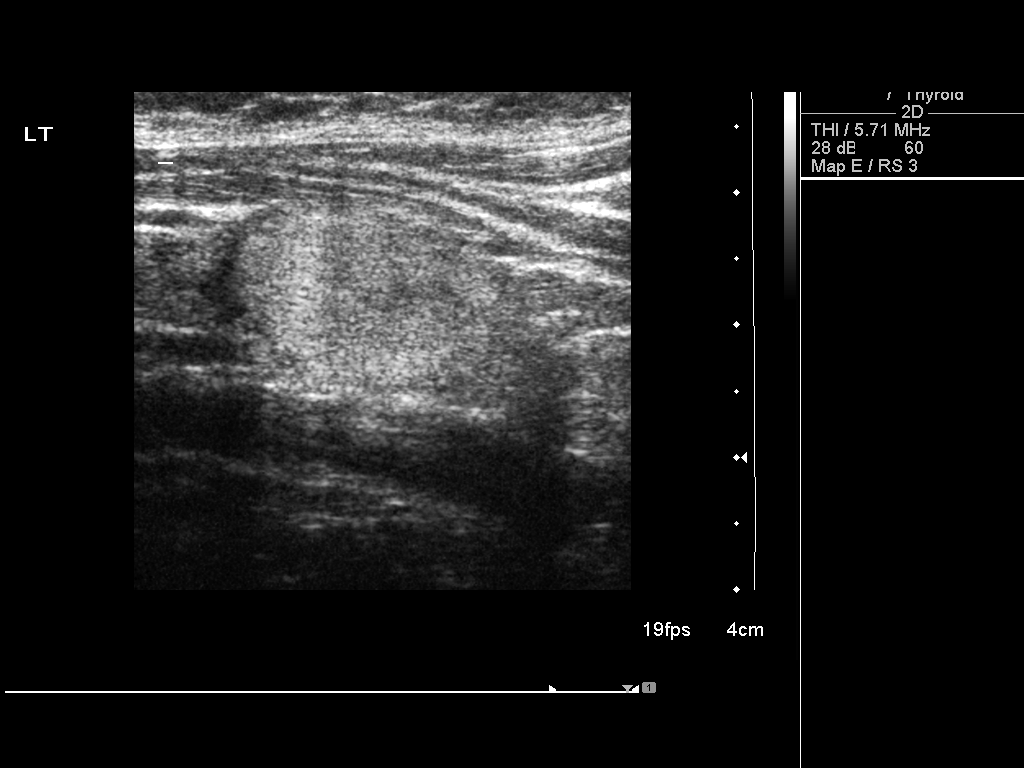

[6 of 6 positions shown; findings below may reference images not displayed]

An appropriate skin entry site was determined. Skin was marked, then
prepped with Betadine, draped in usual sterile fashion, and
infiltrated locally with 1% lidocaine. Under real-time ultrasound
guidance, 4 passes were made into the lesion with 25 gauge needles.
The patient tolerated procedure well, with no immediate
complications.
IMPRESSION: 1. Technically successful ultrasound-guided thyroid aspiration
biopsy, dominant lower pole left nodule.

## 2015-04-20 DIAGNOSIS — E1165 Type 2 diabetes mellitus with hyperglycemia: Secondary | ICD-10-CM | POA: Diagnosis not present

## 2015-04-20 DIAGNOSIS — E890 Postprocedural hypothyroidism: Secondary | ICD-10-CM | POA: Diagnosis not present

## 2015-04-24 DIAGNOSIS — E039 Hypothyroidism, unspecified: Secondary | ICD-10-CM | POA: Diagnosis not present

## 2015-04-24 DIAGNOSIS — J309 Allergic rhinitis, unspecified: Secondary | ICD-10-CM | POA: Diagnosis not present

## 2015-04-24 DIAGNOSIS — I10 Essential (primary) hypertension: Secondary | ICD-10-CM | POA: Diagnosis not present

## 2015-04-24 DIAGNOSIS — E782 Mixed hyperlipidemia: Secondary | ICD-10-CM | POA: Diagnosis not present

## 2015-04-24 DIAGNOSIS — E119 Type 2 diabetes mellitus without complications: Secondary | ICD-10-CM | POA: Diagnosis not present

## 2015-04-26 DIAGNOSIS — E1165 Type 2 diabetes mellitus with hyperglycemia: Secondary | ICD-10-CM | POA: Diagnosis not present

## 2015-04-26 DIAGNOSIS — I10 Essential (primary) hypertension: Secondary | ICD-10-CM | POA: Diagnosis not present

## 2015-04-26 DIAGNOSIS — E890 Postprocedural hypothyroidism: Secondary | ICD-10-CM | POA: Diagnosis not present

## 2015-04-27 IMAGING — CR DG CHEST 2V
2 series · 2 of 2 positions shown · non-contrast
Comparison: PA and lateral chest of October 09, 2011

CLINICAL DATA: History of asthma, congestive heart failure, and
diabetes.

EXAM:
CHEST  2 VIEW

[w chest pa]
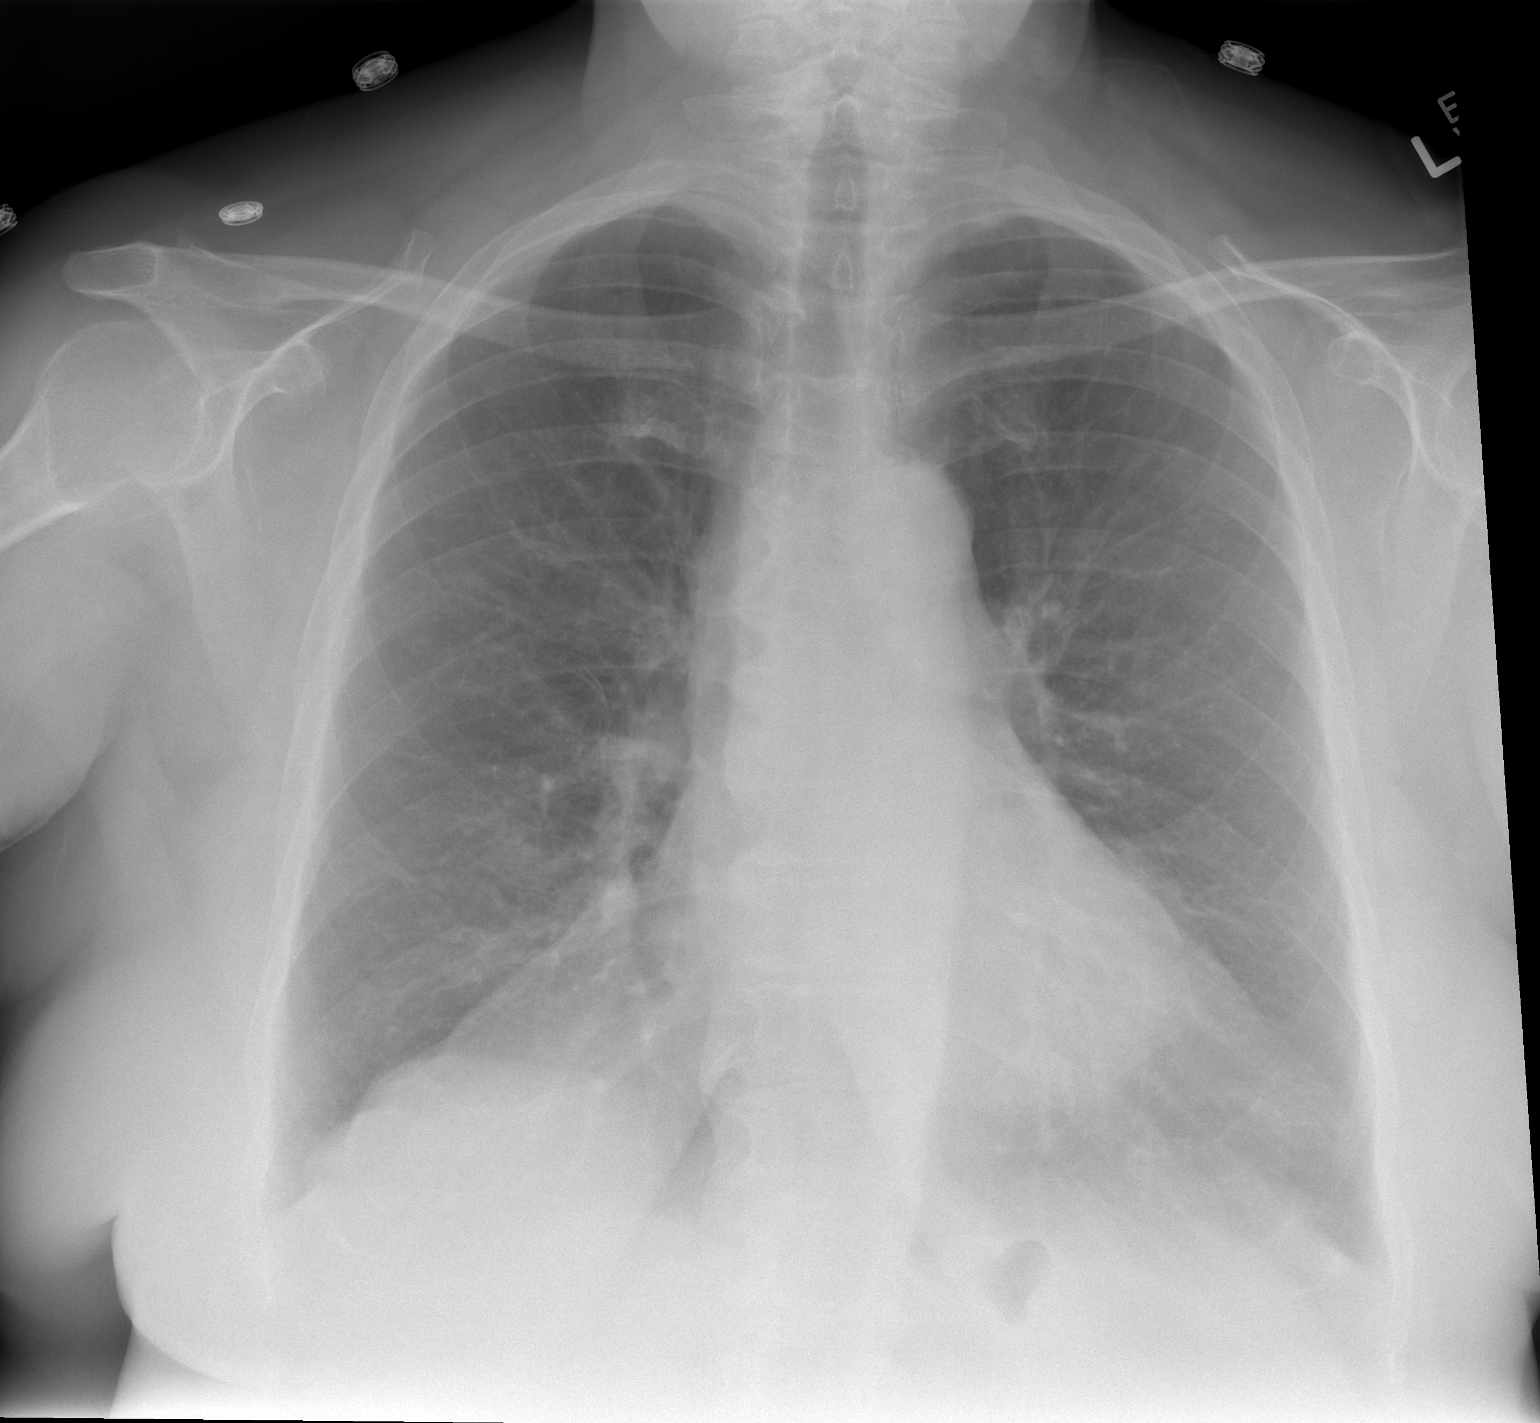

[w chest lat]
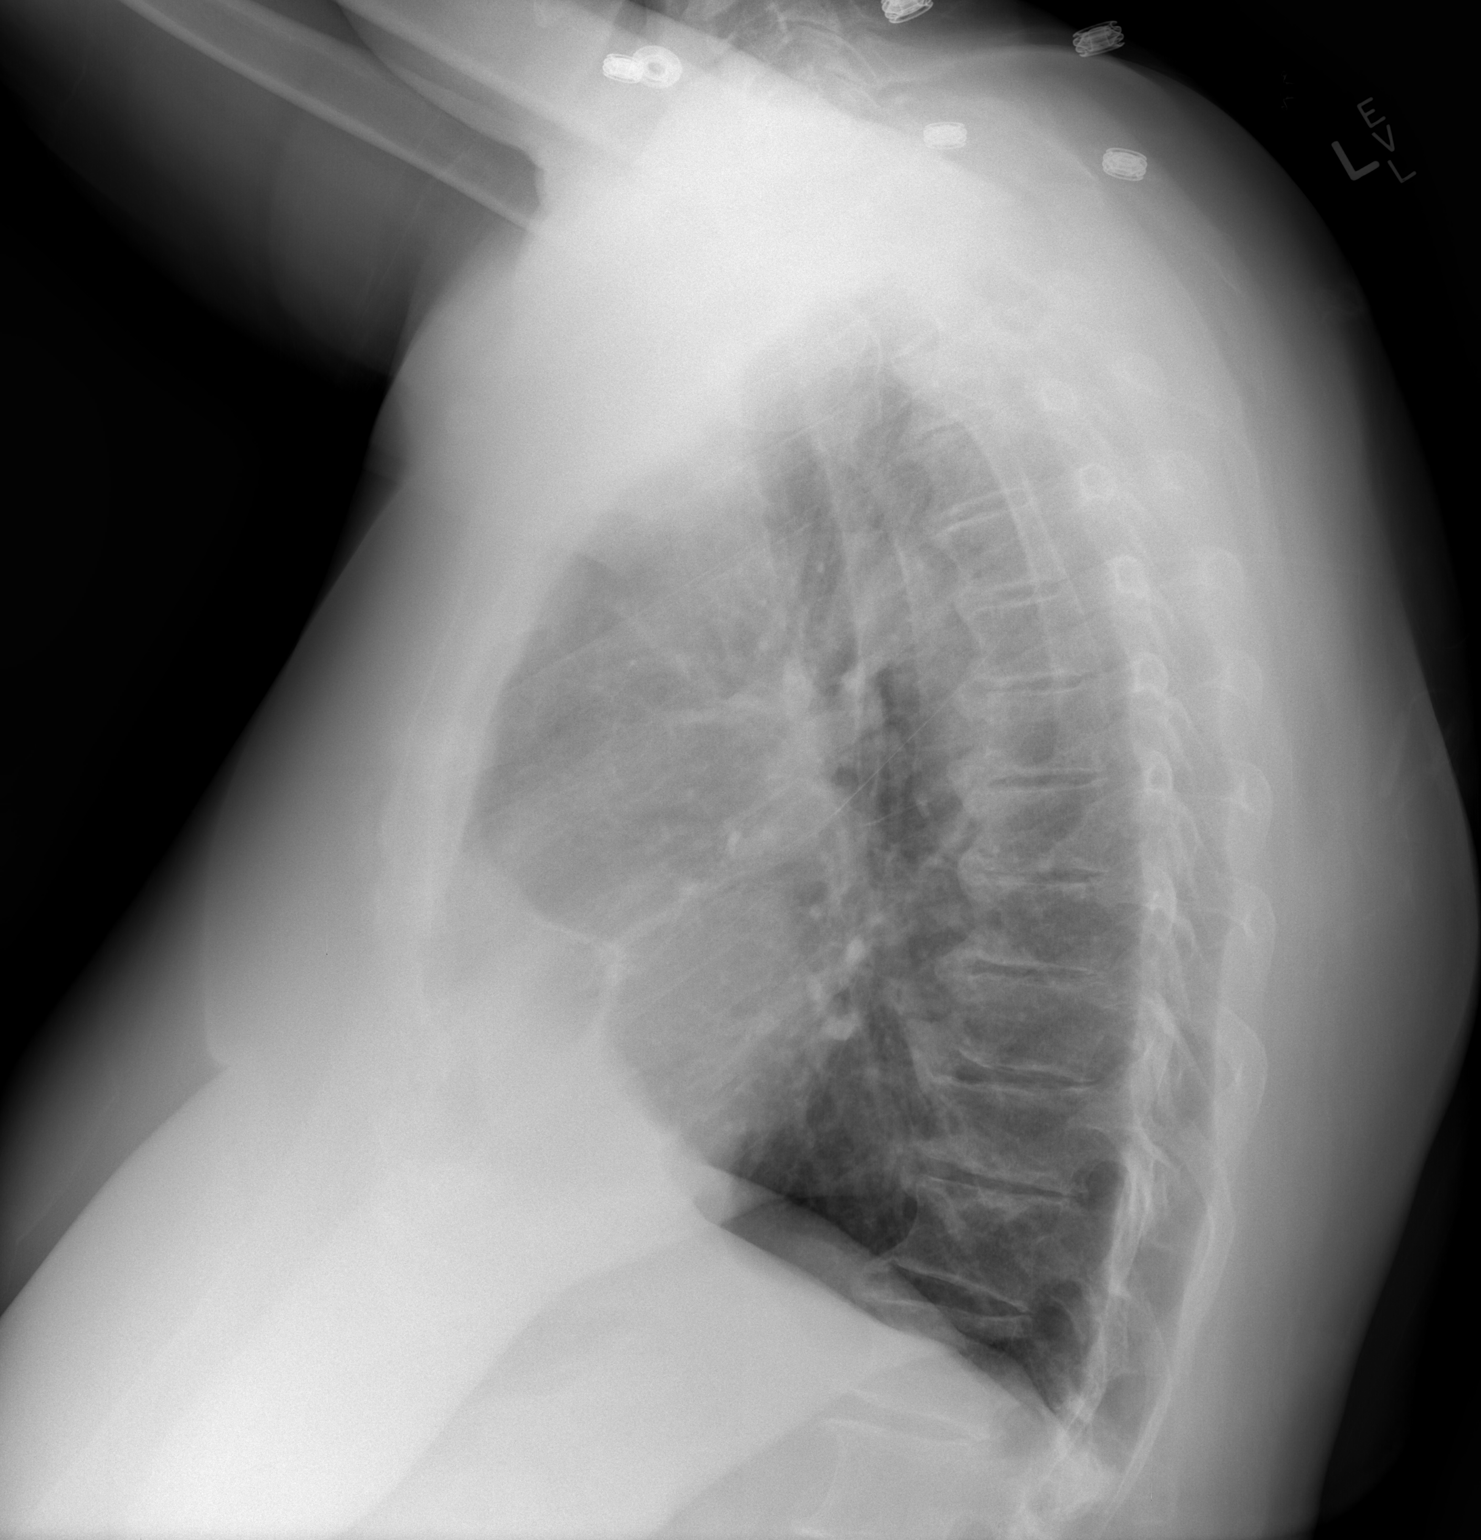

[2 of 2 positions shown; findings below may reference images not displayed]

FINDINGS: The lungs are well-expanded. There is chronically increased density
in the anterior aspect of the lower right hemi thorax likely
reflecting the epicardial fat pad and pleural reflection. The heart
and pulmonary vascularity are normal. There is no pleural effusion.
The trachea is midline. There is mild degenerative disc change at
multiple thoracic levels.
IMPRESSION: There is no active cardiopulmonary disease.

## 2015-04-30 DIAGNOSIS — E1165 Type 2 diabetes mellitus with hyperglycemia: Secondary | ICD-10-CM | POA: Diagnosis not present

## 2015-06-18 DIAGNOSIS — R070 Pain in throat: Secondary | ICD-10-CM | POA: Diagnosis not present

## 2015-09-13 DIAGNOSIS — E890 Postprocedural hypothyroidism: Secondary | ICD-10-CM | POA: Diagnosis not present

## 2015-09-13 DIAGNOSIS — E1165 Type 2 diabetes mellitus with hyperglycemia: Secondary | ICD-10-CM | POA: Diagnosis not present

## 2015-09-18 DIAGNOSIS — E890 Postprocedural hypothyroidism: Secondary | ICD-10-CM | POA: Diagnosis not present

## 2015-09-18 DIAGNOSIS — N184 Chronic kidney disease, stage 4 (severe): Secondary | ICD-10-CM | POA: Diagnosis not present

## 2015-09-18 DIAGNOSIS — I10 Essential (primary) hypertension: Secondary | ICD-10-CM | POA: Diagnosis not present

## 2015-09-18 DIAGNOSIS — E1165 Type 2 diabetes mellitus with hyperglycemia: Secondary | ICD-10-CM | POA: Diagnosis not present

## 2015-11-08 DIAGNOSIS — H1045 Other chronic allergic conjunctivitis: Secondary | ICD-10-CM | POA: Diagnosis not present

## 2015-11-08 DIAGNOSIS — J4530 Mild persistent asthma, uncomplicated: Secondary | ICD-10-CM | POA: Diagnosis not present

## 2015-11-08 DIAGNOSIS — T781XXD Other adverse food reactions, not elsewhere classified, subsequent encounter: Secondary | ICD-10-CM | POA: Diagnosis not present

## 2015-11-08 DIAGNOSIS — J300 Vasomotor rhinitis: Secondary | ICD-10-CM | POA: Diagnosis not present

## 2016-01-09 DIAGNOSIS — Z23 Encounter for immunization: Secondary | ICD-10-CM | POA: Diagnosis not present

## 2016-01-29 DIAGNOSIS — R110 Nausea: Secondary | ICD-10-CM | POA: Diagnosis not present

## 2016-02-06 DIAGNOSIS — E210 Primary hyperparathyroidism: Secondary | ICD-10-CM | POA: Diagnosis not present

## 2016-02-06 DIAGNOSIS — E039 Hypothyroidism, unspecified: Secondary | ICD-10-CM | POA: Diagnosis not present

## 2016-02-06 DIAGNOSIS — N189 Chronic kidney disease, unspecified: Secondary | ICD-10-CM | POA: Diagnosis not present

## 2016-02-06 DIAGNOSIS — R809 Proteinuria, unspecified: Secondary | ICD-10-CM | POA: Diagnosis not present

## 2016-02-06 DIAGNOSIS — N183 Chronic kidney disease, stage 3 (moderate): Secondary | ICD-10-CM | POA: Diagnosis not present

## 2016-02-06 DIAGNOSIS — D631 Anemia in chronic kidney disease: Secondary | ICD-10-CM | POA: Diagnosis not present

## 2016-02-15 DIAGNOSIS — N183 Chronic kidney disease, stage 3 (moderate): Secondary | ICD-10-CM | POA: Diagnosis not present

## 2016-03-17 DIAGNOSIS — E1165 Type 2 diabetes mellitus with hyperglycemia: Secondary | ICD-10-CM | POA: Diagnosis not present

## 2016-03-17 DIAGNOSIS — E890 Postprocedural hypothyroidism: Secondary | ICD-10-CM | POA: Diagnosis not present

## 2016-03-24 DIAGNOSIS — E1165 Type 2 diabetes mellitus with hyperglycemia: Secondary | ICD-10-CM | POA: Diagnosis not present

## 2016-03-24 DIAGNOSIS — I10 Essential (primary) hypertension: Secondary | ICD-10-CM | POA: Diagnosis not present

## 2016-03-24 DIAGNOSIS — E890 Postprocedural hypothyroidism: Secondary | ICD-10-CM | POA: Diagnosis not present

## 2016-04-18 DIAGNOSIS — H04123 Dry eye syndrome of bilateral lacrimal glands: Secondary | ICD-10-CM | POA: Diagnosis not present

## 2016-08-14 DIAGNOSIS — I10 Essential (primary) hypertension: Secondary | ICD-10-CM | POA: Diagnosis not present

## 2016-08-14 DIAGNOSIS — E1165 Type 2 diabetes mellitus with hyperglycemia: Secondary | ICD-10-CM | POA: Diagnosis not present

## 2016-08-14 DIAGNOSIS — N189 Chronic kidney disease, unspecified: Secondary | ICD-10-CM | POA: Diagnosis not present

## 2016-08-14 DIAGNOSIS — E890 Postprocedural hypothyroidism: Secondary | ICD-10-CM | POA: Diagnosis not present

## 2016-08-21 DIAGNOSIS — E1165 Type 2 diabetes mellitus with hyperglycemia: Secondary | ICD-10-CM | POA: Diagnosis not present

## 2016-09-02 DIAGNOSIS — Z23 Encounter for immunization: Secondary | ICD-10-CM | POA: Diagnosis not present

## 2016-09-17 DIAGNOSIS — E1122 Type 2 diabetes mellitus with diabetic chronic kidney disease: Secondary | ICD-10-CM | POA: Diagnosis not present

## 2016-09-17 DIAGNOSIS — M8588 Other specified disorders of bone density and structure, other site: Secondary | ICD-10-CM | POA: Diagnosis not present

## 2016-09-17 DIAGNOSIS — E039 Hypothyroidism, unspecified: Secondary | ICD-10-CM | POA: Diagnosis not present

## 2016-09-17 DIAGNOSIS — F3341 Major depressive disorder, recurrent, in partial remission: Secondary | ICD-10-CM | POA: Diagnosis not present

## 2016-09-17 DIAGNOSIS — E7800 Pure hypercholesterolemia, unspecified: Secondary | ICD-10-CM | POA: Diagnosis not present

## 2016-09-17 DIAGNOSIS — Z1389 Encounter for screening for other disorder: Secondary | ICD-10-CM | POA: Diagnosis not present

## 2016-09-17 DIAGNOSIS — Z0000 Encounter for general adult medical examination without abnormal findings: Secondary | ICD-10-CM | POA: Diagnosis not present

## 2016-09-17 DIAGNOSIS — E1165 Type 2 diabetes mellitus with hyperglycemia: Secondary | ICD-10-CM | POA: Diagnosis not present

## 2016-09-17 DIAGNOSIS — I10 Essential (primary) hypertension: Secondary | ICD-10-CM | POA: Diagnosis not present

## 2016-09-17 DIAGNOSIS — N183 Chronic kidney disease, stage 3 (moderate): Secondary | ICD-10-CM | POA: Diagnosis not present

## 2016-12-04 DIAGNOSIS — E890 Postprocedural hypothyroidism: Secondary | ICD-10-CM | POA: Diagnosis not present

## 2016-12-04 DIAGNOSIS — I10 Essential (primary) hypertension: Secondary | ICD-10-CM | POA: Diagnosis not present

## 2016-12-04 DIAGNOSIS — E1165 Type 2 diabetes mellitus with hyperglycemia: Secondary | ICD-10-CM | POA: Diagnosis not present

## 2016-12-30 DIAGNOSIS — L309 Dermatitis, unspecified: Secondary | ICD-10-CM | POA: Diagnosis not present

## 2017-01-26 DIAGNOSIS — L309 Dermatitis, unspecified: Secondary | ICD-10-CM | POA: Diagnosis not present

## 2017-02-25 DIAGNOSIS — N183 Chronic kidney disease, stage 3 (moderate): Secondary | ICD-10-CM | POA: Diagnosis not present

## 2017-02-25 DIAGNOSIS — E210 Primary hyperparathyroidism: Secondary | ICD-10-CM | POA: Diagnosis not present

## 2017-02-25 DIAGNOSIS — R809 Proteinuria, unspecified: Secondary | ICD-10-CM | POA: Diagnosis not present

## 2017-02-25 DIAGNOSIS — D631 Anemia in chronic kidney disease: Secondary | ICD-10-CM | POA: Diagnosis not present

## 2017-02-25 DIAGNOSIS — N189 Chronic kidney disease, unspecified: Secondary | ICD-10-CM | POA: Diagnosis not present

## 2017-02-25 DIAGNOSIS — E039 Hypothyroidism, unspecified: Secondary | ICD-10-CM | POA: Diagnosis not present

## 2017-03-17 DIAGNOSIS — E7800 Pure hypercholesterolemia, unspecified: Secondary | ICD-10-CM | POA: Diagnosis not present

## 2017-03-17 DIAGNOSIS — E039 Hypothyroidism, unspecified: Secondary | ICD-10-CM | POA: Diagnosis not present

## 2017-03-17 DIAGNOSIS — M8588 Other specified disorders of bone density and structure, other site: Secondary | ICD-10-CM | POA: Diagnosis not present

## 2017-03-17 DIAGNOSIS — E210 Primary hyperparathyroidism: Secondary | ICD-10-CM | POA: Diagnosis not present

## 2017-03-17 DIAGNOSIS — N183 Chronic kidney disease, stage 3 (moderate): Secondary | ICD-10-CM | POA: Diagnosis not present

## 2017-03-17 DIAGNOSIS — E1122 Type 2 diabetes mellitus with diabetic chronic kidney disease: Secondary | ICD-10-CM | POA: Diagnosis not present

## 2017-03-17 DIAGNOSIS — I10 Essential (primary) hypertension: Secondary | ICD-10-CM | POA: Diagnosis not present

## 2017-03-17 DIAGNOSIS — F3341 Major depressive disorder, recurrent, in partial remission: Secondary | ICD-10-CM | POA: Diagnosis not present

## 2017-04-21 DIAGNOSIS — M8588 Other specified disorders of bone density and structure, other site: Secondary | ICD-10-CM | POA: Diagnosis not present

## 2017-08-03 DIAGNOSIS — L309 Dermatitis, unspecified: Secondary | ICD-10-CM | POA: Diagnosis not present

## 2017-09-22 DIAGNOSIS — M8588 Other specified disorders of bone density and structure, other site: Secondary | ICD-10-CM | POA: Diagnosis not present

## 2017-09-22 DIAGNOSIS — Z7189 Other specified counseling: Secondary | ICD-10-CM | POA: Diagnosis not present

## 2017-09-22 DIAGNOSIS — Z0000 Encounter for general adult medical examination without abnormal findings: Secondary | ICD-10-CM | POA: Diagnosis not present

## 2017-09-22 DIAGNOSIS — Z23 Encounter for immunization: Secondary | ICD-10-CM | POA: Diagnosis not present

## 2017-09-22 DIAGNOSIS — E210 Primary hyperparathyroidism: Secondary | ICD-10-CM | POA: Diagnosis not present

## 2017-09-22 DIAGNOSIS — I10 Essential (primary) hypertension: Secondary | ICD-10-CM | POA: Diagnosis not present

## 2017-09-22 DIAGNOSIS — E7800 Pure hypercholesterolemia, unspecified: Secondary | ICD-10-CM | POA: Diagnosis not present

## 2017-09-22 DIAGNOSIS — Z1389 Encounter for screening for other disorder: Secondary | ICD-10-CM | POA: Diagnosis not present

## 2017-09-22 DIAGNOSIS — E1122 Type 2 diabetes mellitus with diabetic chronic kidney disease: Secondary | ICD-10-CM | POA: Diagnosis not present

## 2017-09-22 DIAGNOSIS — N183 Chronic kidney disease, stage 3 (moderate): Secondary | ICD-10-CM | POA: Diagnosis not present

## 2017-09-22 DIAGNOSIS — F3341 Major depressive disorder, recurrent, in partial remission: Secondary | ICD-10-CM | POA: Diagnosis not present

## 2017-10-15 DIAGNOSIS — I10 Essential (primary) hypertension: Secondary | ICD-10-CM | POA: Diagnosis not present

## 2017-10-15 DIAGNOSIS — E1165 Type 2 diabetes mellitus with hyperglycemia: Secondary | ICD-10-CM | POA: Diagnosis not present

## 2017-10-15 DIAGNOSIS — F329 Major depressive disorder, single episode, unspecified: Secondary | ICD-10-CM | POA: Diagnosis not present

## 2017-10-15 DIAGNOSIS — E890 Postprocedural hypothyroidism: Secondary | ICD-10-CM | POA: Diagnosis not present

## 2017-11-06 DIAGNOSIS — L309 Dermatitis, unspecified: Secondary | ICD-10-CM | POA: Diagnosis not present

## 2017-12-18 DIAGNOSIS — E113293 Type 2 diabetes mellitus with mild nonproliferative diabetic retinopathy without macular edema, bilateral: Secondary | ICD-10-CM | POA: Diagnosis not present

## 2018-01-11 ENCOUNTER — Ambulatory Visit: Payer: Medicare Other | Admitting: Obstetrics & Gynecology

## 2018-01-11 ENCOUNTER — Encounter: Payer: Self-pay | Admitting: Obstetrics & Gynecology

## 2018-01-11 VITALS — BP 136/72 | HR 108 | Ht 64.0 in | Wt 176.0 lb

## 2018-01-11 DIAGNOSIS — Z1231 Encounter for screening mammogram for malignant neoplasm of breast: Secondary | ICD-10-CM

## 2018-01-11 DIAGNOSIS — C541 Malignant neoplasm of endometrium: Secondary | ICD-10-CM | POA: Diagnosis not present

## 2018-01-11 DIAGNOSIS — N950 Postmenopausal bleeding: Secondary | ICD-10-CM

## 2018-01-11 NOTE — Progress Notes (Signed)
Needs EMB today for PMB per Dr. Ihor Dow.

## 2018-01-11 NOTE — Progress Notes (Signed)
    OFFICE PROCEDURE NOTE  [name] is a 77 y.o. F here for endometrial biopsy. Had some postmenopausal bleeding last week.  Previous history of PMB some years ago with negative evaluation.  No current bleeding, pain or other symptoms. Last pap was over 20 years ago, no history of cervical dysplasia.  ENDOMETRIAL BIOPSY     The indications for endometrial biopsy were reviewed.   Risks of the biopsy including cramping, bleeding, infection, uterine perforation, inadequate specimen and need for additional procedures  were discussed. The patient states she understands and agrees to undergo procedure today. Consent was signed. Time out was performed. Patient's pelvis was examined, she had atrophic external genitalia. No lesions of vulva or vagina; has moderate atrophy. Scant clear discharge in vault.  Normal cervix noted. The cervix was prepped with Betadine x 2. A single-toothed tenaculum was placed on the anterior lip of the cervix to stabilize it. The 3 mm pipelle was introduced into the endometrial cavity without difficulty to a depth of 9 cm, and a moderate amount of tissue was obtained after two passes and sent to pathology. The instruments were removed from the patient's vagina. Minimal bleeding from the cervix was noted. The patient tolerated the procedure well. Routine post-procedure instructions were given to the patient.    Pelvic ultrasound ordered for patient. She will return after this study to discuss results and further management. Bleeding precautions reviewed.   Verita Schneiders, MD, [facility] [employer] [facility], [place]

## 2018-01-11 NOTE — Patient Instructions (Signed)

## 2018-01-18 ENCOUNTER — Ambulatory Visit (INDEPENDENT_AMBULATORY_CARE_PROVIDER_SITE_OTHER): Payer: Medicare Other | Admitting: Obstetrics & Gynecology

## 2018-01-18 DIAGNOSIS — C541 Malignant neoplasm of endometrium: Secondary | ICD-10-CM

## 2018-01-18 NOTE — Progress Notes (Signed)
   GYNECOLOGY OFFICE VISIT NOTE  History:  77 y.o. F here today for discussion of results of recent endometrial biopsy done for postmenopausal bleeding. She denies any abnormal vaginal discharge, bleeding, pelvic pain or other concerns.   Past Medical History:  Diagnosis Date  . Arthritis    low back,knees  . 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 CHF (congestive heart failure) (HCC)    Has shortness of breath if she does not take Lasix  . Chronic kidney disease    Stated it is 29% functioning  . Diabetes mellitus without complication ([place])    Not on medication  . Headache    sinus  . Hypertension   . Hypothyroidism     Past Surgical History:  Procedure Laterality Date  . APPENDECTOMY     4 th grade  . EYE SURGERY  2014   Bilateral 6 mos. apart  . PARATHYROIDECTOMY N/A 12/07/2014   Procedure: PARATHYROIDECTOMY;  Surgeon: Armandina Gemma, MD;  Location: [facility];  Service: General;  Laterality: N/A;  . THYROID LOBECTOMY Left 12/07/2014   Procedure: LEFT THYROID LOBECTOMY;  Surgeon: Armandina Gemma, MD;  Location: [facility];  Service: General;  Laterality: Left;  . TONSILLECTOMY    . TUBAL LIGATION     Early thirties    The following portions of the patient's history were reviewed and updated as appropriate: allergies, current medications, past family history, past medical history, past social history, past surgical history and problem list.    Review of Systems:  Pertinent items noted in HPI and remainder of comprehensive ROS otherwise negative.  Objective:  Physical Exam AFVSS CONSTITUTIONAL: Well-developed, well-nourished female in no acute distress.  HENT:  Normocephalic, atraumatic. External right and left ear normal. Oropharynx is clear and moist EYES: Conjunctivae and EOM are normal. Pupils are equal, round, and reactive to light. No scleral icterus.  NECK: Normal range of motion, supple, no masses SKIN: Skin  is warm and dry. No rash noted. Not diaphoretic. No erythema. No pallor. NEUROLOGIC: Alert and oriented to person, place, and time. Normal reflexes, muscle tone coordination. No cranial nerve deficit noted. PSYCHIATRIC: Normal mood and affect. Normal behavior. Normal judgment and thought content. ABDOMEN: Soft, no distention noted.   PELVIC: Deferred MUSCULOSKELETAL: Normal range of motion. No edema noted.  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  Assessment & Plan:  1. Endometrial cancer [facility]) Biopsy results reviewed with patient, appropriate support given. All questions answered.  Patient already scheduled for pelvic ultrasound tomorrow, she will keep this appointment.  She was given option of going to [facility] or [place] for [place] follow up; she chose [place].   Appointment made for her with Dr. Everitt Amber, GYN ONC, 01/27/18 at 10 am at the [facility].    Total face-to-face time with patient: 15 minutes.  Over 50% of encounter was spent on counseling and coordination of care.   Verita Schneiders, MD, [place] [facility] [employer], [place]

## 2018-01-18 NOTE — Patient Instructions (Signed)
Uterine Cancer Uterine cancer is an abnormal growth of cancer tissue (malignant tumor) in the uterus. Unlike noncancerous (benign) tumors, malignant tumors can spread to other parts of the body. Uterine cancer usually occurs after menopause. However, it may also occur around the time that menopause begins. The wall of the uterus has an inner layer of tissue (endometrium) and an outer layer of muscle tissue (myometrium). The most common type of uterine cancer begins in the endometrium (endometrial cancer). Cancer that begins in the myometrium (uterine sarcoma) is very rare. What are the causes? The exact cause of this condition is not known. What increases the risk? You are more likely to develop this condition if you:  Are older than 50.  Have an enlarged endometrium (endometrial hyperplasia).  Use hormone therapy.  Are severely overweight (obese).  Use the medicine tamoxifen.  You are white (Caucasian).  Cannot bear children (are infertile).  Have never been pregnant.  Started menstruating at an age younger than 12 years.  Are older than 52 and are still having menstrual periods.  Have a history of cancer of the ovaries, intestines, or colon or rectum (colorectal cancer).  Have a history of enlarged ovaries with small cysts (polycystic ovarian syndrome).  Have a family history of: ? Uterine cancer. ? Hereditary nonpolyposis colon cancer (HNPCC).  Have diabetes, high blood pressure, thyroid disease, or gallbladder disease.  Use long-term, high-dose birth control pills.  Have been exposed to radiation.  Smoke.  What are the signs or symptoms? Symptoms of this condition include:  Abnormal vaginal bleeding or discharge. Bleeding may start as a watery, blood-streaked flow that gradually contains more blood. This is the most common symptom. If you experience abnormal vaginal bleeding, do not assume that it is part of menopause.  Vaginal bleeding after  menopause.  Unexplained weight loss.  Bleeding between periods.  Urination that is difficult, painful, or more frequent than usual.  A lump (mass) in the vagina.  Pain, bloating, or fullness in the abdomen.  Pain in the pelvic area.  Pain during sex.  How is this diagnosed? This condition may be diagnosed based on:  Your medical history and your symptoms.  A physical and pelvic exam. Your health care provider will feel your pelvis for any growths or enlarged lymph nodes.  Blood and urine tests.  Imaging tests, such as X-rays, CT scans, ultrasound, or MRIs.  A procedure in which a thin, flexible tube with a light and camera on the end is inserted through the vagina and used to look inside the uterus (hysteroscopy).  A Pap test to check for abnormal cells in the lower part of the uterus (cervix) and the upper vagina.  Removing a tissue sample (biopsy) from the uterine lining to check for cancer cells.  Dilation and curettage (D&C). This is a procedure that involves stretching (dilation) the cervix and scraping (curettage) the inside lining of the uterus to get a biopsy and check for cancer cells.  Your cancer will be staged to determine its severity and extent. Staging is an assessment of:  The size of the tumor.  Whether the cancer has spread.  Where the cancer has spread.  The stages of uterine cancer are as follows:  Stage I. The cancer is only found in the uterus.  Stage II. The cancer has spread to the cervix.  Stage III. The cancer has spread outside the uterus, but not outside the pelvis. The cancer may have spread to the lymph nodes in the pelvis.  Lymph nodes are part of your body's disease-fighting (immune) system. Lymph nodes are found in many locations in your body, including the neck, underarm, and groin.  Stage IV. The cancer has spread to other parts of the body, such as the bladder or rectum.  How is this treated? This condition is often treated with  surgery to remove:  The uterus, cervix, fallopian tubes, and ovaries (total hysterectomy).  The uterus and cervix (simple hysterectomy).  The type of hysterectomy you will have depends on the extent of your cancer. Lymph nodes near the uterus may also be removed in some cases. Treatment may also include one or more of the following:  Chemotherapy. This uses medicines to kill the cancer cells and prevent their spread.  Radiation therapy. This uses high-energy rays to kill the cancer cells and prevent the spread of cancer.  Chemoradiation. This is a combination treatment that alternates chemotherapy with radiation treatments to enhance the way radiation works.  Brachytherapy. This involves placing radioactive materials inside the body where the cancer was removed.  Hormone therapy. This includes taking medicines that lower the levels of estrogen in the body.  Follow these instructions at home: Activity  Return to your normal activities as told by your health care provider. Ask your health care provider what activities are safe for you.  Exercise regularly as told by your health care provider.  Do not drive or use heavy machinery while taking prescription pain medicine. General instructions  Take over-the-counter and prescription medicines only as told by your health care provider.  Maintain a healthy diet.  Work with your health care provider to: ? Manage any long-term (chronic) conditions you have, such as diabetes, high blood pressure, thyroid disease, or gallbladder disease. ? Manage any side effects of your treatment.  Do not use any products that contain nicotine or tobacco, such as cigarettes and e-cigarettes. If you need help quitting, ask your health care provider.  Consider joining a support group to help you cope with stress. Your health care provider may be able to recommend a local or online support group.  Keep all follow-up visits as told by your health care  provider. This is important. Where to find more information:  American Cancer Society: https://www.cancer.Miguel Barrera ([place]): https://www.cancer.gov Contact a health care provider if:  You have pain in your pelvis or abdomen that gets worse.  You cannot urinate.  You have abnormal bleeding.  You have a fever. Get help right away if:  You develop sudden or new severe symptoms, such as: ? Heavy bleeding. ? Severe weakness. ? Pain that is severe or does not get better with medicine. Summary  Uterine cancer is an abnormal growth of cancer tissue (malignant tumor) in the uterus. The most common type of uterine cancer begins in the endometrium (endometrial cancer).  This condition is often treated with surgery to remove the uterus, cervix, fallopian tubes, and ovaries (total hysterectomy) or the uterus and cervix (simple hysterectomy).  Work with your health care provider to manage any long-term (chronic) conditions you have, such as diabetes, high blood pressure, thyroid disease, or gallbladder disease.  Consider joining a support group to help you cope with stress. Your health care provider may be able to recommend a local or online support group. This information is not intended to replace advice given to you by your health care provider. Make sure you discuss any questions you have with your health care provider. Document Released: 12/08/2005 Document Revised: 12/05/2016 Document  Reviewed: 12/05/2016 Elsevier Interactive Patient Education  2018 Elsevier Inc.  

## 2018-01-19 ENCOUNTER — Ambulatory Visit
Admission: RE | Admit: 2018-01-19 | Discharge: 2018-01-19 | Disposition: A | Discharge status: Home / Self Care | Payer: Medicare Other | Source: Ambulatory Visit | Attending: Obstetrics & Gynecology | Admitting: Obstetrics & Gynecology

## 2018-01-19 DIAGNOSIS — N950 Postmenopausal bleeding: Secondary | ICD-10-CM

## 2018-01-19 DIAGNOSIS — D259 Leiomyoma of uterus, unspecified: Secondary | ICD-10-CM | POA: Diagnosis not present

## 2018-01-27 ENCOUNTER — Encounter: Payer: Self-pay | Admitting: Gynecologic Oncology

## 2018-01-27 ENCOUNTER — Ambulatory Visit: Payer: Medicare Other | Admitting: Obstetrics & Gynecology

## 2018-01-27 ENCOUNTER — Inpatient Hospital Stay: Payer: Medicare Other | Attending: Gynecologic Oncology | Admitting: Gynecologic Oncology

## 2018-01-27 VITALS — BP 157/48 | HR 86 | Temp 98.3°F | Resp 18 | Wt 175.2 lb

## 2018-01-27 DIAGNOSIS — I509 Heart failure, unspecified: Secondary | ICD-10-CM | POA: Diagnosis not present

## 2018-01-27 DIAGNOSIS — Z79899 Other long term (current) drug therapy: Secondary | ICD-10-CM

## 2018-01-27 DIAGNOSIS — E039 Hypothyroidism, unspecified: Secondary | ICD-10-CM | POA: Diagnosis not present

## 2018-01-27 DIAGNOSIS — C541 Malignant neoplasm of endometrium: Secondary | ICD-10-CM | POA: Insufficient documentation

## 2018-01-27 DIAGNOSIS — N189 Chronic kidney disease, unspecified: Secondary | ICD-10-CM | POA: Diagnosis not present

## 2018-01-27 DIAGNOSIS — E118 Type 2 diabetes mellitus with unspecified complications: Secondary | ICD-10-CM

## 2018-01-27 DIAGNOSIS — I129 Hypertensive chronic kidney disease with stage 1 through stage 4 chronic kidney disease, or unspecified chronic kidney disease: Secondary | ICD-10-CM | POA: Insufficient documentation

## 2018-01-27 NOTE — Progress Notes (Signed)
Consult Note: Gyn-Onc  Consult was requested by Dr. Harolyn Rutherford for the evaluation of Cassandra Baker 77 y.o. female  CC:  Chief Complaint  Patient presents with  . Endometrial cancer [facility])    Assessment/Plan:  Cassandra Baker  is a 77 y.o.  year old with endometrial cancer (grade 1).  A detailed discussion was held with the patient and her family with regard to to her endometrial cancer diagnosis. We discussed the standard management options for uterine cancer which includes surgery followed possibly by adjuvant therapy depending on the results of surgery. The options for surgical management include a hysterectomy and removal of the tubes and ovaries possibly with removal of pelvic and para-aortic lymph nodes.If feasible, a minimally invasive approach including a robotic hysterectomy or laparoscopic hysterectomy have benefits including shorter hospital stay, recovery time and better wound healing than with open surgery. The patient has been counseled about these surgical options and the risks of surgery in general including infection, bleeding, damage to surrounding structures (including bowel, bladder, ureters, nerves or vessels), and the postoperative risks of PE/ DVT, and lymphedema. I extensively reviewed the additional risks of robotic hysterectomy including possible need for conversion to open laparotomy.  I discussed positioning during surgery of trendelenberg and risks of minor facial swelling and care we take in preoperative positioning.  After counseling and consideration of her options, she desires to proceed with robotic assisted total hysterectomy with bilateral sapingo-oophorectomy and SLN biopsy.   She will be seen by anesthesia for preoperative clearance and discussion of postoperative pain management.  She was given the opportunity to ask questions, which were answered to her satisfaction, and she is agreement with the above mentioned plan of care.  She has been instructed to stop  ASA use preop.   HPI: Cassandra Baker is a 77 year old P1 who is seen in consultation at the request of Dr Harolyn Rutherford for grade 1 endometrial cancer.   The patient reports vaginal spotting for several years off and on.  In January 2019 she developed heavy menstrual-like bleeding.  She was seen by her gynecologist who performed a TVUS on 01/19/18 which showed a fibroid uterus measuring 9.2 x 4.9 x 5.4 cm.  The endometrium was thickened and measured 14 mm.  The left and right ovaries were not visualized.  There is no free fluid seen.  An endometrial biopsy was performed by Dr. Harolyn Rutherford on January 11, 2018 revealed FIGO grade 1 endometrioid adenocarcinoma.  The patient has a personal medical history of asthma, congestive heart failure for which she takes Lasix, chronic kidney disease, diabetes mellitus.  She takes aspirin occasionally for a sensation of head allergies.  She has a family history of a maternal aunt with cervical cancer.  She has had 1 prior vaginal delivery.  Her past abdominal surgeries include a tubal ligation and appendectomy.  Current Meds:  Outpatient Encounter Medications as of 01/27/2018  Medication Sig  . allopurinol (ZYLOPRIM) 100 MG tablet Take 100 mg by mouth daily.  [name] Kitchen aspirin 325 MG EC tablet Take 325 mg by mouth daily.  . Cholecalciferol (VITAMIN D) 2000 UNITS tablet Take 2,000 Units by mouth daily.  [name] Kitchen FLUoxetine (PROZAC) 10 MG capsule Take 10 mg by mouth at bedtime.  . furosemide (LASIX) 40 MG tablet Take 80 mg by mouth 2 (two) times daily.  [name] Kitchen levothyroxine (SYNTHROID, LEVOTHROID) 100 MCG tablet Take 100 mcg by mouth daily before breakfast.  . ramipril (ALTACE) 10 MG capsule Take 10 mg by mouth  at bedtime.  . [DISCONTINUED] fluticasone (FLONASE) 50 MCG/ACT nasal spray Place 2 sprays into both nostrils daily.  . [DISCONTINUED] HYDROcodone-acetaminophen (NORCO/VICODIN) 5-325 MG per tablet Take 1-2 tablets by mouth every 4 (four) hours as needed for moderate pain. (Patient not  taking: Reported on 01/11/2018)   No facility-administered encounter medications on file as of 01/27/2018.     Allergy:  Allergies  Allergen Reactions  . Penicillins Rash    Social Hx:   Social History   Socioeconomic History  . Marital status: Married    Spouse name: Not on file  . Number of children: Not on file  . Years of education: Not on file  . Highest education level: Not on file  Social Needs  . Financial resource strain: Not on file  . Food insecurity - worry: Not on file  . Food insecurity - inability: Not on file  . Transportation needs - medical: Not on file  . Transportation needs - non-medical: Not on file  Occupational History  . Not on file  Tobacco Use  . Smoking status: Never Smoker  . Smokeless tobacco: Never Used  Substance and Sexual Activity  . Alcohol use: No  . Drug use: No  . Sexual activity: Not on file  Other Topics Concern  . Not on file  Social History Narrative  . Not on file    Past Surgical Hx:  Past Surgical History:  Procedure Laterality Date  . APPENDECTOMY     4 th grade  . EYE SURGERY  2014   Bilateral 6 mos. apart  . PARATHYROIDECTOMY N/A 12/07/2014   Procedure: PARATHYROIDECTOMY;  Surgeon: Armandina Gemma, MD;  Location: [facility];  Service: General;  Laterality: N/A;  . THYROID LOBECTOMY Left 12/07/2014   Procedure: LEFT THYROID LOBECTOMY;  Surgeon: Armandina Gemma, MD;  Location: [facility];  Service: General;  Laterality: Left;  . TONSILLECTOMY    . TUBAL LIGATION     Early thirties    Past Medical Hx:  Past Medical History:  Diagnosis Date  . Arthritis    low back,knees  . 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 CHF (congestive heart failure) (HCC)    Has shortness of breath if she does not take Lasix  . Chronic kidney disease    Stated it is 29% functioning  . Diabetes mellitus without complication ([place])    Not on medication  . Headache    sinus  .  Hypertension   . Hypothyroidism     Past Gynecological History:   No LMP recorded. Patient is postmenopausal.  Family Hx:  Family History  Problem Relation Age of Onset  . Hypertension Mother   . Heart disease Mother   . Hypertension Father   . Heart disease Father   . Cervical cancer Maternal Aunt     Review of Systems:  Constitutional  Feels well,    ENT Normal appearing ears and nares bilaterally Skin/Breast  No rash, sores, jaundice, itching, dryness Cardiovascular  No chest pain, shortness of breath, or edema  Pulmonary  No cough or wheeze.  Gastro Intestinal  No nausea, vomitting, or diarrhoea. No bright red blood per rectum, no abdominal pain, change in bowel movement, or constipation.  Genito Urinary  No frequency, urgency, dysuria, + postmenopausal bleeding. Musculo Skeletal  No myalgia, arthralgia, joint swelling or pain  Neurologic  No weakness, numbness, change in gait,  Psychology  No depression, anxiety, insomnia.   Vitals:  Blood pressure (!) 157/48, pulse 86, temperature 98.3 F (36.8 C), temperature source Oral, resp. rate 18, weight 175 lb 3.2 oz (79.5 kg), SpO2 97 %.  Physical Exam: WD in NAD Neck  Supple NROM, without any enlargements.  Lymph Node Survey No cervical supraclavicular or inguinal adenopathy Cardiovascular  Pulse normal rate, regularity and rhythm. S1 and S2 normal.  Lungs  Clear to auscultation bilateraly, without wheezes/crackles/rhonchi. Good air movement.  Skin  No rash/lesions/breakdown  Psychiatry  Alert and oriented to person, place, and time  Abdomen  Normoactive bowel sounds, abdomen soft, non-tender and obese without evidence of hernia.  Back No CVA tenderness Genito Urinary  Vulva/vagina: Normal external female genitalia.   No lesions. No discharge or bleeding.  Bladder/urethra:  No lesions or masses, well supported bladder  Vagina: normal, no lesions  Cervix: Normal appearing, no lesions.  Uterus: bulky,  10cm,mobile, no parametrial involvement or nodularity.  Adnexa: no masses. Rectal  deferred Extremities  No bilateral cyanosis, clubbing or edema.   [name], MD  01/27/2018, 10:46 AM

## 2018-01-27 NOTE — H&P (View-Only) (Signed)
Consult Note: Gyn-Onc  Consult was requested by Dr. Harolyn Rutherford for the evaluation of Berenice Primas 77 y.o. female  CC:  Chief Complaint  Patient presents with  . Endometrial cancer [facility])    Assessment/Plan:  Ms. BRYCELYNN STAMPLEY  is a 77 y.o.  year old with endometrial cancer (grade 1).  A detailed discussion was held with the patient and her family with regard to to her endometrial cancer diagnosis. We discussed the standard management options for uterine cancer which includes surgery followed possibly by adjuvant therapy depending on the results of surgery. The options for surgical management include a hysterectomy and removal of the tubes and ovaries possibly with removal of pelvic and para-aortic lymph nodes.If feasible, a minimally invasive approach including a robotic hysterectomy or laparoscopic hysterectomy have benefits including shorter hospital stay, recovery time and better wound healing than with open surgery. The patient has been counseled about these surgical options and the risks of surgery in general including infection, bleeding, damage to surrounding structures (including bowel, bladder, ureters, nerves or vessels), and the postoperative risks of PE/ DVT, and lymphedema. I extensively reviewed the additional risks of robotic hysterectomy including possible need for conversion to open laparotomy.  I discussed positioning during surgery of trendelenberg and risks of minor facial swelling and care we take in preoperative positioning.  After counseling and consideration of her options, she desires to proceed with robotic assisted total hysterectomy with bilateral sapingo-oophorectomy and SLN biopsy.   She will be seen by anesthesia for preoperative clearance and discussion of postoperative pain management.  She was given the opportunity to ask questions, which were answered to her satisfaction, and she is agreement with the above mentioned plan of care.  She has been instructed to stop  ASA use preop.   HPI: Hong Timm is a 77 year old P1 who is seen in consultation at the request of Dr Harolyn Rutherford for grade 1 endometrial cancer.   The patient reports vaginal spotting for several years off and on.  In January 2019 she developed heavy menstrual-like bleeding.  She was seen by her gynecologist who performed a TVUS on 01/19/18 which showed a fibroid uterus measuring 9.2 x 4.9 x 5.4 cm.  The endometrium was thickened and measured 14 mm.  The left and right ovaries were not visualized.  There is no free fluid seen.  An endometrial biopsy was performed by Dr. Harolyn Rutherford on January 11, 2018 revealed FIGO grade 1 endometrioid adenocarcinoma.  The patient has a personal medical history of asthma, congestive heart failure for which she takes Lasix, chronic kidney disease, diabetes mellitus.  She takes aspirin occasionally for a sensation of head allergies.  She has a family history of a maternal aunt with cervical cancer.  She has had 1 prior vaginal delivery.  Her past abdominal surgeries include a tubal ligation and appendectomy.  Current Meds:  Outpatient Encounter Medications as of 01/27/2018  Medication Sig  . allopurinol (ZYLOPRIM) 100 MG tablet Take 100 mg by mouth daily.  [name] Kitchen aspirin 325 MG EC tablet Take 325 mg by mouth daily.  . Cholecalciferol (VITAMIN D) 2000 UNITS tablet Take 2,000 Units by mouth daily.  [name] Kitchen FLUoxetine (PROZAC) 10 MG capsule Take 10 mg by mouth at bedtime.  . furosemide (LASIX) 40 MG tablet Take 80 mg by mouth 2 (two) times daily.  [name] Kitchen levothyroxine (SYNTHROID, LEVOTHROID) 100 MCG tablet Take 100 mcg by mouth daily before breakfast.  . ramipril (ALTACE) 10 MG capsule Take 10 mg by mouth  at bedtime.  . [DISCONTINUED] fluticasone (FLONASE) 50 MCG/ACT nasal spray Place 2 sprays into both nostrils daily.  . [DISCONTINUED] HYDROcodone-acetaminophen (NORCO/VICODIN) 5-325 MG per tablet Take 1-2 tablets by mouth every 4 (four) hours as needed for moderate pain. (Patient not  taking: Reported on 01/11/2018)   No facility-administered encounter medications on file as of 01/27/2018.     Allergy:  Allergies  Allergen Reactions  . Penicillins Rash    Social Hx:   Social History   Socioeconomic History  . Marital status: Married    Spouse name: Not on file  . Number of children: Not on file  . Years of education: Not on file  . Highest education level: Not on file  Social Needs  . Financial resource strain: Not on file  . Food insecurity - worry: Not on file  . Food insecurity - inability: Not on file  . Transportation needs - medical: Not on file  . Transportation needs - non-medical: Not on file  Occupational History  . Not on file  Tobacco Use  . Smoking status: Never Smoker  . Smokeless tobacco: Never Used  Substance and Sexual Activity  . Alcohol use: No  . Drug use: No  . Sexual activity: Not on file  Other Topics Concern  . Not on file  Social History Narrative  . Not on file    Past Surgical Hx:  Past Surgical History:  Procedure Laterality Date  . APPENDECTOMY     4 th grade  . EYE SURGERY  2014   Bilateral 6 mos. apart  . PARATHYROIDECTOMY N/A 12/07/2014   Procedure: PARATHYROIDECTOMY;  Surgeon: Armandina Gemma, MD;  Location: [facility];  Service: General;  Laterality: N/A;  . THYROID LOBECTOMY Left 12/07/2014   Procedure: LEFT THYROID LOBECTOMY;  Surgeon: Armandina Gemma, MD;  Location: [facility];  Service: General;  Laterality: Left;  . TONSILLECTOMY    . TUBAL LIGATION     Early thirties    Past Medical Hx:  Past Medical History:  Diagnosis Date  . Arthritis    low back,knees  . 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 CHF (congestive heart failure) ([facility])    Has shortness of breath if she does not take Lasix  . Chronic kidney disease    Stated it is 29% functioning  . Diabetes mellitus without complication ([place])    Not on medication  . Headache    sinus  .  Hypertension   . Hypothyroidism     Past Gynecological History:   No LMP recorded. Patient is postmenopausal.  Family Hx:  Family History  Problem Relation Age of Onset  . Hypertension Mother   . Heart disease Mother   . Hypertension Father   . Heart disease Father   . Cervical cancer Maternal Aunt     Review of Systems:  Constitutional  Feels well,    ENT Normal appearing ears and nares bilaterally Skin/Breast  No rash, sores, jaundice, itching, dryness Cardiovascular  No chest pain, shortness of breath, or edema  Pulmonary  No cough or wheeze.  Gastro Intestinal  No nausea, vomitting, or diarrhoea. No bright red blood per rectum, no abdominal pain, change in bowel movement, or constipation.  Genito Urinary  No frequency, urgency, dysuria, + postmenopausal bleeding. Musculo Skeletal  No myalgia, arthralgia, joint swelling or pain  Neurologic  No weakness, numbness, change in gait,  Psychology  No depression, anxiety, insomnia.   Vitals:  Blood pressure (!) 157/48, pulse 86, temperature 98.3 F (36.8 C), temperature source Oral, resp. rate 18, weight 175 lb 3.2 oz (79.5 kg), SpO2 97 %.  Physical Exam: WD in NAD Neck  Supple NROM, without any enlargements.  Lymph Node Survey No cervical supraclavicular or inguinal adenopathy Cardiovascular  Pulse normal rate, regularity and rhythm. S1 and S2 normal.  Lungs  Clear to auscultation bilateraly, without wheezes/crackles/rhonchi. Good air movement.  Skin  No rash/lesions/breakdown  Psychiatry  Alert and oriented to person, place, and time  Abdomen  Normoactive bowel sounds, abdomen soft, non-tender and obese without evidence of hernia.  Back No CVA tenderness Genito Urinary  Vulva/vagina: Normal external female genitalia.   No lesions. No discharge or bleeding.  Bladder/urethra:  No lesions or masses, well supported bladder  Vagina: normal, no lesions  Cervix: Normal appearing, no lesions.  Uterus: bulky,  10cm,mobile, no parametrial involvement or nodularity.  Adnexa: no masses. Rectal  deferred Extremities  No bilateral cyanosis, clubbing or edema.   [name], MD  01/27/2018, 10:46 AM

## 2018-01-27 NOTE — Patient Instructions (Addendum)
Preparing for your Surgery  Plan for surgery on February 09, 2018 with Dr. Everitt Amber at [facility].  You will be scheduled for a robotic assisted total hysterectomy, bilateral salpingo-oophorectomy, sentinel lymph node biopsy.  NO MORE ASPIRIN.  Pre-operative Testing -You will receive a phone call from presurgical testing at [facility] to arrange for a pre-operative testing appointment before your surgery.  This appointment normally occurs one to two weeks before your scheduled surgery.   -Bring your insurance card, copy of an advanced directive if applicable, medication list  -At that visit, you will be asked to sign a consent for a possible blood transfusion in case a transfusion becomes necessary during surgery.  The need for a blood transfusion is rare but having consent is a necessary part of your care.     -You should not be taking blood thinners or aspirin at least ten days prior to surgery unless instructed by your surgeon.  Day Before Surgery at [place] will be asked to take in a light diet the day before surgery.  Avoid carbonated beverages.  You will be advised to have nothing to eat or drink after midnight the evening before.    Eat a light diet the day before surgery.  Examples including soups, broths, toast, yogurt, mashed potatoes.  Things to avoid include carbonated beverages (fizzy beverages), raw fruits and raw vegetables, or beans.   If your bowels are filled with gas, your surgeon will have difficulty visualizing your pelvic organs which increases your surgical risks.  Your role in recovery Your role is to become active as soon as directed by your doctor, while still giving yourself time to heal.  Rest when you feel tired. You will be asked to do the following in order to speed your recovery:  - Cough and breathe deeply. This helps toclear and expand your lungs and can prevent pneumonia. You may be given a spirometer to practice deep breathing.  A staff member will show you how to use the spirometer. - Do mild physical activity. Walking or moving your legs help your circulation and body functions return to normal. A staff member will help you when you try to walk and will provide you with simple exercises. Do not try to get up or walk alone the first time. - Actively manage your pain. Managing your pain lets you move in comfort. We will ask you to rate your pain on a scale of zero to 10. It is your responsibility to tell your doctor or nurse where and how much you hurt so your pain can be treated.  Special Considerations -If you are diabetic, you may be placed on insulin after surgery to have closer control over your blood sugars to promote healing and recovery.  This does not mean that you will be discharged on insulin.  If applicable, your oral antidiabetics will be resumed when you are tolerating a solid diet.  -Your final pathology results from surgery should be available by the Friday after surgery and the results will be relayed to you when available.  -Dr. Lahoma Crocker is the Surgeon that assists your GYN Oncologist with surgery.  The next day after your surgery you will either see your GYN Oncologist or Dr. Lahoma Crocker.   Blood Transfusion Information WHAT IS A BLOOD TRANSFUSION? A transfusion is the replacement of blood or some of its parts. Blood is made up of multiple cells which provide different functions.  Red blood cells carry oxygen and are  used for blood loss replacement.  White blood cells fight against infection.  Platelets control bleeding.  Plasma helps clot blood.  Other blood products are available for specialized needs, such as hemophilia or other clotting disorders. BEFORE THE TRANSFUSION  Who gives blood for transfusions?   You may be able to donate blood to be used at a later date on yourself (autologous donation).  Relatives can be asked to donate blood. This is generally not any safer  than if you have received blood from a stranger. The same precautions are taken to ensure safety when a relative's blood is donated.  Healthy volunteers who are fully evaluated to make sure their blood is safe. This is blood bank blood. Transfusion therapy is the safest it has ever been in the practice of medicine. Before blood is taken from a donor, a complete history is taken to make sure that person has no history of diseases nor engages in risky social behavior (examples are intravenous drug use or sexual activity with multiple partners). The donor's travel history is screened to minimize risk of transmitting infections, such as malaria. The donated blood is tested for signs of infectious diseases, such as HIV and hepatitis. The blood is then tested to be sure it is compatible with you in order to minimize the chance of a transfusion reaction. If you or a relative donates blood, this is often done in anticipation of surgery and is not appropriate for emergency situations. It takes many days to process the donated blood. RISKS AND COMPLICATIONS Although transfusion therapy is very safe and saves many lives, the main dangers of transfusion include:   Getting an infectious disease.  Developing a transfusion reaction. This is an allergic reaction to something in the blood you were given. Every precaution is taken to prevent this. The decision to have a blood transfusion has been considered carefully by your caregiver before blood is given. Blood is not given unless the benefits outweigh the risks.

## 2018-01-29 ENCOUNTER — Ambulatory Visit
Admission: RE | Admit: 2018-01-29 | Discharge: 2018-01-29 | Disposition: A | Discharge status: Home / Self Care | Payer: Medicare Other | Source: Ambulatory Visit | Attending: Obstetrics & Gynecology | Admitting: Obstetrics & Gynecology

## 2018-01-29 ENCOUNTER — Ambulatory Visit: Payer: Medicare Other

## 2018-01-29 DIAGNOSIS — Z1231 Encounter for screening mammogram for malignant neoplasm of breast: Secondary | ICD-10-CM

## 2018-02-01 ENCOUNTER — Ambulatory Visit: Payer: Medicare Other

## 2018-02-03 NOTE — Patient Instructions (Addendum)
LARINDA HERTER  02/03/2018   Your procedure is scheduled on: 02-09-18   Report to [facility] Main  Entrance               Report to admitting at    [place] AM   Call this number if you have problems the morning of surgery [phone]                FOLLOW A LIGHT DIET THE DAY BEFORE SURGERY EX. TOAST, SOUPS, BROTHS, MASHED POTATOES AND YOGURT. AVOID:CARBONATED BEVERAGES RAW FRUITS AND VEGGIES   Remember: NO SOLID FOOD AFTER MIDNIGHT THE NIGHT PRIOR TO [place]. NOTHING BY MOUTH EXCEPT CLEAR LIQUIDS UNTIL 3 HOURS PRIOR TO [place] SURGERY. PLEASE FINISH ENSURE DRINK PER SURGEON ORDER 3 HOURS PRIOR TO SCHEDULED SURGERY TIME WHICH NEEDS TO BE COMPLETED AT _    815am_________..     Take these medicines the morning of surgery with A SIP OF WATER: synthroid, fluoxentine                                You may not have any metal on your body including hair pins and              piercings  Do not wear jewelry, make-up, lotions, powders or perfumes, deodorant             Do not wear nail polish.  Do not shave  48 hours prior to surgery.     Do not bring valuables to the hospital. [place].  Contacts, dentures or bridgework may not be worn into surgery.  Leave suitcase in the car. After surgery it may be brought to your room.                Please read over the following fact sheets you were given: _____________________________________________________________________       [facility] - Preparing for Surgery Before surgery, you can play an important role.  Because skin is not sterile, your skin needs to be as free of germs as possible.  You can reduce the number of germs on your skin by washing with CHG (chlorahexidine gluconate) soap before surgery.  CHG is an antiseptic cleaner which kills germs and bonds with the skin to continue killing germs even after washing. Please DO NOT use if you have an allergy to CHG or  antibacterial soaps.  If your skin becomes reddened/irritated stop using the CHG and inform your nurse when you arrive at Short Stay. Do not shave (including legs and underarms) for at least 48 hours prior to the first CHG shower.  You may shave your face/neck. Please follow these instructions carefully:  1.  Shower with CHG Soap the night before surgery and the  morning of Surgery.  2.  If you choose to wash your hair, wash your hair first as usual with your  normal  shampoo.  3.  After you shampoo, rinse your hair and body thoroughly to remove the  shampoo.                           4.  Use CHG as you would any other liquid soap.  You can apply chg  directly  to the skin and wash                       Gently with a scrungie or clean washcloth.  5.  Apply the CHG Soap to your body ONLY FROM THE NECK DOWN.   Do not use on face/ open                           Wound or open sores. Avoid contact with eyes, ears mouth and genitals (private parts).                       Wash face,  Genitals (private parts) with your normal soap.             6.  Wash thoroughly, paying special attention to the area where your surgery  will be performed.  7.  Thoroughly rinse your body with warm water from the neck down.  8.  DO NOT shower/wash with your normal soap after using and rinsing off  the CHG Soap.                9.  Pat yourself dry with a clean towel.            10.  Wear clean pajamas.            11.  Place clean sheets on your bed the night of your first shower and do not  sleep with pets. Day of Surgery : Do not apply any lotions/deodorants the morning of surgery.  Please wear clean clothes to the hospital/surgery center.  FAILURE TO FOLLOW THESE INSTRUCTIONS MAY RESULT IN THE CANCELLATION OF YOUR SURGERY PATIENT SIGNATURE_________________________________  NURSE SIGNATURE__________________________________  ________________________________________________________________________  WHAT IS A BLOOD  TRANSFUSION? Blood Transfusion Information  A transfusion is the replacement of blood or some of its parts. Blood is made up of multiple cells which provide different functions.  Red blood cells carry oxygen and are used for blood loss replacement.  White blood cells fight against infection.  Platelets control bleeding.  Plasma helps clot blood.  Other blood products are available for specialized needs, such as hemophilia or other clotting disorders. BEFORE THE TRANSFUSION  Who gives blood for transfusions?   Healthy volunteers who are fully evaluated to make sure their blood is safe. This is blood bank blood. Transfusion therapy is the safest it has ever been in the practice of medicine. Before blood is taken from a donor, a complete history is taken to make sure that person has no history of diseases nor engages in risky social behavior (examples are intravenous drug use or sexual activity with multiple partners). The donor's travel history is screened to minimize risk of transmitting infections, such as malaria. The donated blood is tested for signs of infectious diseases, such as HIV and hepatitis. The blood is then tested to be sure it is compatible with you in order to minimize the chance of a transfusion reaction. If you or a relative donates blood, this is often done in anticipation of surgery and is not appropriate for emergency situations. It takes many days to process the donated blood. RISKS AND COMPLICATIONS Although transfusion therapy is very safe and saves many lives, the main dangers of transfusion include:   Getting an infectious disease.  Developing a transfusion reaction. This is an allergic reaction to something in the blood you were given. Every precaution is taken  to prevent this. The decision to have a blood transfusion has been considered carefully by your caregiver before blood is given. Blood is not given unless the benefits outweigh the risks. AFTER THE  TRANSFUSION  Right after receiving a blood transfusion, you will usually feel much better and more energetic. This is especially true if your red blood cells have gotten low (anemic). The transfusion raises the level of the red blood cells which carry oxygen, and this usually causes an energy increase.  The nurse administering the transfusion will monitor you carefully for complications. HOME CARE INSTRUCTIONS  No special instructions are needed after a transfusion. You may find your energy is better. Speak with your caregiver about any limitations on activity for underlying diseases you may have. SEEK MEDICAL CARE IF:   Your condition is not improving after your transfusion.  You develop redness or irritation at the intravenous (IV) site. SEEK IMMEDIATE MEDICAL CARE IF:  Any of the following symptoms occur over the next 12 hours:  Shaking chills.  You have a temperature by mouth above 102 F (38.9 C), not controlled by medicine.  Chest, back, or muscle pain.  People around you feel you are not acting correctly or are confused.  Shortness of breath or difficulty breathing.  Dizziness and fainting.  You get a rash or develop hives.  You have a decrease in urine output.  Your urine turns a dark color or changes to pink, red, or brown. Any of the following symptoms occur over the next 10 days:  You have a temperature by mouth above 102 F (38.9 C), not controlled by medicine.  Shortness of breath.  Weakness after normal activity.  The white part of the eye turns yellow (jaundice).  You have a decrease in the amount of urine or are urinating less often.  Your urine turns a dark color or changes to pink, red, or brown. Document Released: 12/05/2000 Document Revised: 03/01/2012 Document Reviewed: 07/24/2008 ExitCare Patient Information 2014 [place].  _______________________________________________________________________  Incentive Spirometer  An incentive  spirometer is a tool that can help keep your lungs clear and active. This tool measures how well you are filling your lungs with each breath. Taking long deep breaths may help reverse or decrease the chance of developing breathing (pulmonary) problems (especially infection) following:  A long period of time when you are unable to move or be active. BEFORE THE PROCEDURE   If the spirometer includes an indicator to show your best effort, your nurse or respiratory therapist will set it to a desired goal.  If possible, sit up straight or lean slightly forward. Try not to slouch.  Hold the incentive spirometer in an upright position. INSTRUCTIONS FOR USE  1. Sit on the edge of your bed if possible, or sit up as far as you can in bed or on a chair. 2. Hold the incentive spirometer in an upright position. 3. Breathe out normally. 4. Place the mouthpiece in your mouth and seal your lips tightly around it. 5. Breathe in slowly and as deeply as possible, raising the piston or the ball toward the top of the column. 6. Hold your breath for 3-5 seconds or for as long as possible. Allow the piston or ball to fall to the bottom of the column. 7. Remove the mouthpiece from your mouth and breathe out normally. 8. Rest for a few seconds and repeat Steps 1 through 7 at least 10 times every 1-2 hours when you are awake. Take your time  and take a few normal breaths between deep breaths. 9. The spirometer may include an indicator to show your best effort. Use the indicator as a goal to work toward during each repetition. 10. After each set of 10 deep breaths, practice coughing to be sure your lungs are clear. If you have an incision (the cut made at the time of surgery), support your incision when coughing by placing a pillow or rolled up towels firmly against it. Once you are able to get out of bed, walk around indoors and cough well. You may stop using the incentive spirometer when instructed by your caregiver.   RISKS AND COMPLICATIONS  Take your time so you do not get dizzy or light-headed.  If you are in pain, you may need to take or ask for pain medication before doing incentive spirometry. It is harder to take a deep breath if you are having pain. AFTER USE  Rest and breathe slowly and easily.  It can be helpful to keep track of a log of your progress. Your caregiver can provide you with a simple table to help with this. If you are using the spirometer at home, follow these instructions: [place] IF:   You are having difficultly using the spirometer.  You have trouble using the spirometer as often as instructed.  Your pain medication is not giving enough relief while using the spirometer.  You develop fever of 100.5 F (38.1 C) or higher. SEEK IMMEDIATE MEDICAL CARE IF:   You cough up bloody sputum that had not been present before.  You develop fever of 102 F (38.9 C) or greater.  You develop worsening pain at or near the incision site. MAKE SURE YOU:   Understand these instructions.  Will watch your condition.  Will get help right away if you are not doing well or get worse. Document Released: 04/20/2007 Document Revised: 03/01/2012 Document Reviewed: 06/21/2007 [facility] Patient Information 2014 [place], [place].   ________________________________________________________________________

## 2018-02-04 ENCOUNTER — Encounter (HOSPITAL_COMMUNITY): Payer: Self-pay

## 2018-02-04 ENCOUNTER — Other Ambulatory Visit: Payer: Self-pay

## 2018-02-04 ENCOUNTER — Encounter (HOSPITAL_COMMUNITY)
Admission: RE | Admit: 2018-02-04 | Discharge: 2018-02-04 | Disposition: A | Discharge status: Home / Self Care | Payer: Medicare Other | Source: Ambulatory Visit | Attending: Gynecologic Oncology | Admitting: Gynecologic Oncology

## 2018-02-04 DIAGNOSIS — I130 Hypertensive heart and chronic kidney disease with heart failure and stage 1 through stage 4 chronic kidney disease, or unspecified chronic kidney disease: Secondary | ICD-10-CM | POA: Diagnosis not present

## 2018-02-04 DIAGNOSIS — C541 Malignant neoplasm of endometrium: Secondary | ICD-10-CM | POA: Insufficient documentation

## 2018-02-04 DIAGNOSIS — E1122 Type 2 diabetes mellitus with diabetic chronic kidney disease: Secondary | ICD-10-CM | POA: Diagnosis not present

## 2018-02-04 DIAGNOSIS — Z01812 Encounter for preprocedural laboratory examination: Secondary | ICD-10-CM | POA: Diagnosis not present

## 2018-02-04 DIAGNOSIS — Z7982 Long term (current) use of aspirin: Secondary | ICD-10-CM | POA: Insufficient documentation

## 2018-02-04 DIAGNOSIS — Z85828 Personal history of other malignant neoplasm of skin: Secondary | ICD-10-CM | POA: Diagnosis not present

## 2018-02-04 DIAGNOSIS — Z79899 Other long term (current) drug therapy: Secondary | ICD-10-CM | POA: Diagnosis not present

## 2018-02-04 DIAGNOSIS — Z0183 Encounter for blood typing: Secondary | ICD-10-CM | POA: Insufficient documentation

## 2018-02-04 DIAGNOSIS — I129 Hypertensive chronic kidney disease with stage 1 through stage 4 chronic kidney disease, or unspecified chronic kidney disease: Secondary | ICD-10-CM | POA: Insufficient documentation

## 2018-02-04 DIAGNOSIS — Z01818 Encounter for other preprocedural examination: Secondary | ICD-10-CM | POA: Insufficient documentation

## 2018-02-04 DIAGNOSIS — I509 Heart failure, unspecified: Secondary | ICD-10-CM | POA: Diagnosis not present

## 2018-02-04 DIAGNOSIS — Z880 Allergy status to penicillin: Secondary | ICD-10-CM | POA: Diagnosis not present

## 2018-02-04 DIAGNOSIS — Z8049 Family history of malignant neoplasm of other genital organs: Secondary | ICD-10-CM | POA: Diagnosis not present

## 2018-02-04 DIAGNOSIS — C542 Malignant neoplasm of myometrium: Secondary | ICD-10-CM | POA: Diagnosis not present

## 2018-02-04 DIAGNOSIS — D259 Leiomyoma of uterus, unspecified: Secondary | ICD-10-CM | POA: Diagnosis not present

## 2018-02-04 DIAGNOSIS — E039 Hypothyroidism, unspecified: Secondary | ICD-10-CM | POA: Insufficient documentation

## 2018-02-04 DIAGNOSIS — N189 Chronic kidney disease, unspecified: Secondary | ICD-10-CM | POA: Diagnosis not present

## 2018-02-04 DIAGNOSIS — E890 Postprocedural hypothyroidism: Secondary | ICD-10-CM | POA: Diagnosis not present

## 2018-02-04 DIAGNOSIS — Z79890 Hormone replacement therapy (postmenopausal): Secondary | ICD-10-CM | POA: Diagnosis not present

## 2018-02-04 HISTORY — DX: Dyspnea, unspecified: R06.00

## 2018-02-04 LAB — COMPREHENSIVE METABOLIC PANEL
ALBUMIN: 3.7 g/dL (ref 3.5–5.0)
ALK PHOS: 128 U/L — AB (ref 38–126)
ALT: 16 U/L (ref 14–54)
ANION GAP: 10 (ref 5–15)
AST: 14 U/L — ABNORMAL LOW (ref 15–41)
BUN: 69 mg/dL — ABNORMAL HIGH (ref 6–20)
CALCIUM: 9 mg/dL (ref 8.9–10.3)
CHLORIDE: 104 mmol/L (ref 101–111)
CO2: 24 mmol/L (ref 22–32)
Creatinine, Ser: 2.27 mg/dL — ABNORMAL HIGH (ref 0.44–1.00)
GFR calc Af Amer: 23 mL/min — ABNORMAL LOW (ref >60)
GFR calc non Af Amer: 20 mL/min — ABNORMAL LOW (ref >60)
GLUCOSE: 126 mg/dL — AB (ref 65–99)
Potassium: 4.3 mmol/L (ref 3.5–5.1)
SODIUM: 138 mmol/L (ref 135–145)
Total Bilirubin: 0.1 mg/dL — ABNORMAL LOW (ref 0.3–1.2)
Total Protein: 7.6 g/dL (ref 6.5–8.1)

## 2018-02-04 LAB — CBC
HCT: 35.5 % — ABNORMAL LOW (ref 36.0–46.0)
HEMOGLOBIN: 11.2 g/dL — AB (ref 12.0–15.0)
MCH: 26.9 pg (ref 26.0–34.0)
MCHC: 31.5 g/dL (ref 30.0–36.0)
MCV: 85.3 fL (ref 78.0–100.0)
PLATELETS: 226 10*3/uL (ref 150–400)
RBC: 4.16 MIL/uL (ref 3.87–5.11)
RDW: 15.9 % — ABNORMAL HIGH (ref 11.5–15.5)
WBC: 9.2 10*3/uL (ref 4.0–10.5)

## 2018-02-04 LAB — URINALYSIS, ROUTINE W REFLEX MICROSCOPIC
Bilirubin Urine: NEGATIVE
GLUCOSE, UA: 50 mg/dL — AB
Ketones, ur: NEGATIVE mg/dL
NITRITE: NEGATIVE
PH: 6 (ref 5.0–8.0)
Protein, ur: 100 mg/dL — AB
SPECIFIC GRAVITY, URINE: 1.009 (ref 1.005–1.030)

## 2018-02-04 LAB — ABO/RH: ABO/RH(D): A POS

## 2018-02-04 LAB — HEMOGLOBIN A1C
Hgb A1c MFr Bld: 6.9 % — ABNORMAL HIGH (ref 4.8–5.6)
Mean Plasma Glucose: 151.33 mg/dL

## 2018-02-04 NOTE — Progress Notes (Signed)
UA, AND CMP DONE 02-04-18 ROUTED TO DR. Denman George VIA EPIC

## 2018-02-04 NOTE — Progress Notes (Signed)
Dr. Delfina Redwood PCP LOV 09-22-17 chart  [facility] lov 02-25-17 chart.

## 2018-02-05 NOTE — Progress Notes (Signed)
Final ekg in epic 

## 2018-02-07 LAB — URINE CULTURE

## 2018-02-08 ENCOUNTER — Telehealth: Payer: Self-pay | Admitting: Gynecologic Oncology

## 2018-02-08 DIAGNOSIS — N3000 Acute cystitis without hematuria: Secondary | ICD-10-CM

## 2018-02-08 MED ORDER — LEVOFLOXACIN 250 MG PO TABS: 250.0000 mg | ORAL_TABLET | Freq: Every day | ORAL | 0 refills | Status: DC

## 2018-02-08 NOTE — Telephone Encounter (Signed)
Informed patient of urine culture results and Dr. Serita Grit recommendations for Levaquin 250 mg daily for three days.  Patient states she is asymptomatic.  Allergic to PCN.  No concerns voiced.  No allergy to Levaquin voiced.  Advised to call for any needs or concerns.  Pt is diabetic but not on any medication.  Advised to monitor for signs of hypoglycemia.

## 2018-02-09 ENCOUNTER — Encounter (HOSPITAL_COMMUNITY): Payer: Self-pay | Admitting: Anesthesiology

## 2018-02-09 ENCOUNTER — Ambulatory Visit (HOSPITAL_COMMUNITY): Payer: Medicare Other | Admitting: Certified Registered Nurse Anesthetist

## 2018-02-09 ENCOUNTER — Encounter (HOSPITAL_COMMUNITY)
Admission: RE | Disposition: A | Discharge status: Home / Self Care | Payer: Self-pay | Source: Ambulatory Visit | Attending: Gynecologic Oncology

## 2018-02-09 ENCOUNTER — Other Ambulatory Visit: Payer: Self-pay

## 2018-02-09 ENCOUNTER — Ambulatory Visit (HOSPITAL_COMMUNITY)
Admission: RE | Admit: 2018-02-09 | Discharge: 2018-02-11 | Disposition: A | Discharge status: Home / Self Care | Payer: Medicare Other | Source: Ambulatory Visit | Attending: Gynecologic Oncology | Admitting: Gynecologic Oncology

## 2018-02-09 DIAGNOSIS — Z7982 Long term (current) use of aspirin: Secondary | ICD-10-CM | POA: Insufficient documentation

## 2018-02-09 DIAGNOSIS — Z85828 Personal history of other malignant neoplasm of skin: Secondary | ICD-10-CM | POA: Diagnosis not present

## 2018-02-09 DIAGNOSIS — R34 Anuria and oliguria: Secondary | ICD-10-CM

## 2018-02-09 DIAGNOSIS — Z79890 Hormone replacement therapy (postmenopausal): Secondary | ICD-10-CM | POA: Insufficient documentation

## 2018-02-09 DIAGNOSIS — Z8049 Family history of malignant neoplasm of other genital organs: Secondary | ICD-10-CM | POA: Diagnosis not present

## 2018-02-09 DIAGNOSIS — Z79899 Other long term (current) drug therapy: Secondary | ICD-10-CM | POA: Diagnosis not present

## 2018-02-09 DIAGNOSIS — I509 Heart failure, unspecified: Secondary | ICD-10-CM | POA: Insufficient documentation

## 2018-02-09 DIAGNOSIS — D259 Leiomyoma of uterus, unspecified: Secondary | ICD-10-CM | POA: Insufficient documentation

## 2018-02-09 DIAGNOSIS — C541 Malignant neoplasm of endometrium: Secondary | ICD-10-CM | POA: Insufficient documentation

## 2018-02-09 DIAGNOSIS — C542 Malignant neoplasm of myometrium: Secondary | ICD-10-CM | POA: Insufficient documentation

## 2018-02-09 DIAGNOSIS — E119 Type 2 diabetes mellitus without complications: Secondary | ICD-10-CM | POA: Diagnosis not present

## 2018-02-09 DIAGNOSIS — E1122 Type 2 diabetes mellitus with diabetic chronic kidney disease: Secondary | ICD-10-CM | POA: Insufficient documentation

## 2018-02-09 DIAGNOSIS — I130 Hypertensive heart and chronic kidney disease with heart failure and stage 1 through stage 4 chronic kidney disease, or unspecified chronic kidney disease: Secondary | ICD-10-CM | POA: Insufficient documentation

## 2018-02-09 DIAGNOSIS — E890 Postprocedural hypothyroidism: Secondary | ICD-10-CM | POA: Insufficient documentation

## 2018-02-09 DIAGNOSIS — E210 Primary hyperparathyroidism: Secondary | ICD-10-CM | POA: Diagnosis not present

## 2018-02-09 DIAGNOSIS — N189 Chronic kidney disease, unspecified: Secondary | ICD-10-CM | POA: Insufficient documentation

## 2018-02-09 DIAGNOSIS — I10 Essential (primary) hypertension: Secondary | ICD-10-CM | POA: Diagnosis not present

## 2018-02-09 DIAGNOSIS — R590 Localized enlarged lymph nodes: Secondary | ICD-10-CM

## 2018-02-09 HISTORY — PX: ROBOTIC ASSISTED TOTAL HYSTERECTOMY WITH BILATERAL SALPINGO OOPHERECTOMY: SHX6086

## 2018-02-09 HISTORY — PX: LYMPH NODE BIOPSY: SHX201

## 2018-02-09 LAB — TYPE AND SCREEN
ABO/RH(D): A POS
Antibody Screen: NEGATIVE

## 2018-02-09 LAB — GLUCOSE, CAPILLARY
GLUCOSE-CAPILLARY: 191 mg/dL — AB (ref 65–99)
GLUCOSE-CAPILLARY: 183 mg/dL — AB (ref 65–99)
GLUCOSE-CAPILLARY: 230 mg/dL — AB (ref 65–99)

## 2018-02-09 SURGERY — HYSTERECTOMY, TOTAL, ROBOT-ASSISTED, LAPAROSCOPIC, WITH BILATERAL SALPINGO-OOPHORECTOMY
Anesthesia: General

## 2018-02-09 MED ORDER — SUGAMMADEX SODIUM 200 MG/2ML IV SOLN
INTRAVENOUS | Status: DC | PRN
  Administered 2018-02-09: 300 mg via INTRAVENOUS

## 2018-02-09 MED ORDER — PHENYLEPHRINE HCL 10 MG/ML IJ SOLN
INTRAMUSCULAR | Status: DC | PRN
  Administered 2018-02-09: 40 ug via INTRAVENOUS

## 2018-02-09 MED ORDER — ALBUMIN HUMAN 5 % IV SOLN
INTRAVENOUS | Status: DC | PRN
  Administered 2018-02-09: 12:00:00 via INTRAVENOUS

## 2018-02-09 MED ORDER — EPHEDRINE SULFATE 50 MG/ML IJ SOLN
INTRAMUSCULAR | Status: DC | PRN
  Administered 2018-02-09: 15 mg via INTRAVENOUS
  Administered 2018-02-09: 10 mg via INTRAVENOUS
  Administered 2018-02-09: 15 mg via INTRAVENOUS

## 2018-02-09 MED ORDER — SODIUM CHLORIDE 0.9 % IV SOLN
INTRAVENOUS | Status: DC
  Administered 2018-02-09: 1000 mL via INTRAVENOUS

## 2018-02-09 MED ORDER — TRAMADOL HCL 50 MG PO TABS: 100.0000 mg | ORAL_TABLET | Freq: Two times a day (BID) | ORAL | Status: DC | PRN

## 2018-02-09 MED ORDER — CELECOXIB 200 MG PO CAPS
400.0000 mg | ORAL_CAPSULE | ORAL | Status: AC
  Administered 2018-02-09: 400 mg via ORAL
  Filled 2018-02-09: qty 2

## 2018-02-09 MED ORDER — ONDANSETRON HCL 4 MG/2ML IJ SOLN: 4.0000 mg | Freq: Four times a day (QID) | INTRAMUSCULAR | Status: DC | PRN

## 2018-02-09 MED ORDER — ACETAMINOPHEN 500 MG PO TABS
1000.0000 mg | ORAL_TABLET | ORAL | Status: AC
  Administered 2018-02-09: 1000 mg via ORAL
  Filled 2018-02-09: qty 2

## 2018-02-09 MED ORDER — DEXAMETHASONE SODIUM PHOSPHATE 10 MG/ML IJ SOLN
INTRAMUSCULAR | Status: AC
  Filled 2018-02-09: qty 1

## 2018-02-09 MED ORDER — SENNOSIDES-DOCUSATE SODIUM 8.6-50 MG PO TABS
2.0000 | ORAL_TABLET | Freq: Every day | ORAL | Status: DC
  Administered 2018-02-09 – 2018-02-10 (×2): 2 via ORAL
  Filled 2018-02-09 (×2): qty 2

## 2018-02-09 MED ORDER — SODIUM CHLORIDE 0.9 % IV SOLN: INTRAVENOUS | Status: DC

## 2018-02-09 MED ORDER — FENTANYL CITRATE (PF) 250 MCG/5ML IJ SOLN
INTRAMUSCULAR | Status: AC
  Filled 2018-02-09: qty 5

## 2018-02-09 MED ORDER — SCOPOLAMINE 1 MG/3DAYS TD PT72
1.0000 | MEDICATED_PATCH | TRANSDERMAL | Status: DC
  Administered 2018-02-09: 1.5 mg via TRANSDERMAL
  Filled 2018-02-09: qty 1

## 2018-02-09 MED ORDER — FLUOXETINE HCL 10 MG PO CAPS
10.0000 mg | ORAL_CAPSULE | Freq: Every day | ORAL | Status: DC
  Administered 2018-02-09 – 2018-02-10 (×2): 10 mg via ORAL
  Filled 2018-02-09 (×2): qty 1

## 2018-02-09 MED ORDER — LACTATED RINGERS IR SOLN
Status: DC | PRN
  Administered 2018-02-09: 1000 mL

## 2018-02-09 MED ORDER — FENTANYL CITRATE (PF) 100 MCG/2ML IJ SOLN
INTRAMUSCULAR | Status: AC
  Administered 2018-02-09: 25 ug via INTRAVENOUS
  Filled 2018-02-09: qty 2

## 2018-02-09 MED ORDER — HYDROMORPHONE HCL 1 MG/ML IJ SOLN: 0.2000 mg | INTRAMUSCULAR | Status: DC | PRN

## 2018-02-09 MED ORDER — FENTANYL CITRATE (PF) 100 MCG/2ML IJ SOLN
25.0000 ug | INTRAMUSCULAR | Status: DC | PRN
  Administered 2018-02-09: 25 ug via INTRAVENOUS

## 2018-02-09 MED ORDER — INSULIN GLARGINE 100 UNIT/ML ~~LOC~~ SOLN
14.0000 [IU] | Freq: Every day | SUBCUTANEOUS | Status: DC
  Administered 2018-02-09 – 2018-02-10 (×2): 14 [IU] via SUBCUTANEOUS
  Filled 2018-02-09 (×3): qty 0.14

## 2018-02-09 MED ORDER — GABAPENTIN 300 MG PO CAPS
300.0000 mg | ORAL_CAPSULE | ORAL | Status: AC
  Administered 2018-02-09: 300 mg via ORAL
  Filled 2018-02-09: qty 1

## 2018-02-09 MED ORDER — STERILE WATER FOR INJECTION IJ SOLN
INTRAMUSCULAR | Status: AC
  Filled 2018-02-09: qty 10

## 2018-02-09 MED ORDER — KCL IN DEXTROSE-NACL 20-5-0.45 MEQ/L-%-% IV SOLN
INTRAVENOUS | Status: DC
  Administered 2018-02-09: 17:00:00 via INTRAVENOUS
  Filled 2018-02-09 (×2): qty 1000

## 2018-02-09 MED ORDER — PROPOFOL 10 MG/ML IV BOLUS
INTRAVENOUS | Status: DC | PRN
  Administered 2018-02-09: 160 mg via INTRAVENOUS

## 2018-02-09 MED ORDER — ONDANSETRON HCL 4 MG/2ML IJ SOLN
INTRAMUSCULAR | Status: AC
  Filled 2018-02-09: qty 2

## 2018-02-09 MED ORDER — IBUPROFEN 200 MG PO TABS: 600.0000 mg | ORAL_TABLET | Freq: Four times a day (QID) | ORAL | Status: DC | PRN

## 2018-02-09 MED ORDER — ONDANSETRON HCL 4 MG/2ML IJ SOLN
INTRAMUSCULAR | Status: DC | PRN
  Administered 2018-02-09: 4 mg via INTRAVENOUS

## 2018-02-09 MED ORDER — LEVOTHYROXINE SODIUM 112 MCG PO TABS
112.0000 ug | ORAL_TABLET | Freq: Every day | ORAL | Status: DC
Start: 2018-02-10 — End: 2018-02-11
  Administered 2018-02-10 – 2018-02-11 (×2): 112 ug via ORAL
  Filled 2018-02-09 (×2): qty 1

## 2018-02-09 MED ORDER — FENTANYL CITRATE (PF) 100 MCG/2ML IJ SOLN
INTRAMUSCULAR | Status: DC | PRN
  Administered 2018-02-09 (×5): 50 ug via INTRAVENOUS

## 2018-02-09 MED ORDER — ONDANSETRON HCL 4 MG PO TABS: 4.0000 mg | ORAL_TABLET | Freq: Four times a day (QID) | ORAL | Status: DC | PRN

## 2018-02-09 MED ORDER — ENOXAPARIN SODIUM 40 MG/0.4ML ~~LOC~~ SOLN: 40.0000 mg | SUBCUTANEOUS | Status: DC

## 2018-02-09 MED ORDER — LACTATED RINGERS IV SOLN: INTRAVENOUS | Status: DC

## 2018-02-09 MED ORDER — MEPERIDINE HCL 50 MG/ML IJ SOLN: 6.2500 mg | INTRAMUSCULAR | Status: DC | PRN

## 2018-02-09 MED ORDER — GABAPENTIN 300 MG PO CAPS
300.0000 mg | ORAL_CAPSULE | Freq: Every day | ORAL | Status: AC
  Administered 2018-02-09: 300 mg via ORAL
  Filled 2018-02-09: qty 1

## 2018-02-09 MED ORDER — RAMIPRIL 10 MG PO CAPS
10.0000 mg | ORAL_CAPSULE | Freq: Every day | ORAL | Status: DC
  Administered 2018-02-09 – 2018-02-10 (×2): 10 mg via ORAL
  Filled 2018-02-09 (×2): qty 1

## 2018-02-09 MED ORDER — DEXAMETHASONE SODIUM PHOSPHATE 10 MG/ML IJ SOLN
INTRAMUSCULAR | Status: DC | PRN
  Administered 2018-02-09: 10 mg via INTRAVENOUS

## 2018-02-09 MED ORDER — LIDOCAINE 2% (20 MG/ML) 5 ML SYRINGE
INTRAMUSCULAR | Status: AC
  Filled 2018-02-09: qty 5

## 2018-02-09 MED ORDER — ACETAMINOPHEN 500 MG PO TABS
1000.0000 mg | ORAL_TABLET | Freq: Two times a day (BID) | ORAL | Status: DC
  Administered 2018-02-09 – 2018-02-11 (×4): 1000 mg via ORAL
  Filled 2018-02-09 (×4): qty 2

## 2018-02-09 MED ORDER — METOCLOPRAMIDE HCL 5 MG/ML IJ SOLN: 10.0000 mg | Freq: Once | INTRAMUSCULAR | Status: DC | PRN

## 2018-02-09 MED ORDER — ENOXAPARIN SODIUM 40 MG/0.4ML ~~LOC~~ SOLN
40.0000 mg | SUBCUTANEOUS | Status: AC
  Administered 2018-02-09: 40 mg via SUBCUTANEOUS
  Filled 2018-02-09: qty 0.4

## 2018-02-09 MED ORDER — INSULIN ASPART 100 UNIT/ML ~~LOC~~ SOLN
0.0000 [IU] | Freq: Three times a day (TID) | SUBCUTANEOUS | Status: DC
  Administered 2018-02-09 – 2018-02-10 (×2): 3 [IU] via SUBCUTANEOUS
  Administered 2018-02-10: 2 [IU] via SUBCUTANEOUS
  Administered 2018-02-10: 3 [IU] via SUBCUTANEOUS
  Administered 2018-02-11: 2 [IU] via SUBCUTANEOUS

## 2018-02-09 MED ORDER — LIDOCAINE 2% (20 MG/ML) 5 ML SYRINGE
INTRAMUSCULAR | Status: DC | PRN
  Administered 2018-02-09: 100 mg via INTRAVENOUS

## 2018-02-09 MED ORDER — SUCCINYLCHOLINE CHLORIDE 200 MG/10ML IV SOSY
PREFILLED_SYRINGE | INTRAVENOUS | Status: DC | PRN
  Administered 2018-02-09: 100 mg via INTRAVENOUS

## 2018-02-09 MED ORDER — ROCURONIUM BROMIDE 10 MG/ML (PF) SYRINGE
PREFILLED_SYRINGE | INTRAVENOUS | Status: AC
  Filled 2018-02-09: qty 5

## 2018-02-09 MED ORDER — OXYCODONE HCL 5 MG PO TABS: 5.0000 mg | ORAL_TABLET | ORAL | Status: DC | PRN

## 2018-02-09 MED ORDER — PROPOFOL 10 MG/ML IV BOLUS
INTRAVENOUS | Status: AC
  Filled 2018-02-09: qty 20

## 2018-02-09 MED ORDER — CLINDAMYCIN PHOSPHATE 900 MG/50ML IV SOLN
900.0000 mg | INTRAVENOUS | Status: AC
  Administered 2018-02-09: 900 mg via INTRAVENOUS
  Filled 2018-02-09: qty 50

## 2018-02-09 MED ORDER — CIPROFLOXACIN IN D5W 400 MG/200ML IV SOLN
400.0000 mg | INTRAVENOUS | Status: AC
  Administered 2018-02-09: 400 mg via INTRAVENOUS
  Filled 2018-02-09: qty 200

## 2018-02-09 MED ORDER — DEXAMETHASONE SODIUM PHOSPHATE 4 MG/ML IJ SOLN: 4.0000 mg | INTRAMUSCULAR | Status: DC

## 2018-02-09 MED ORDER — STERILE WATER FOR IRRIGATION IR SOLN
Status: DC | PRN
Start: 2018-02-09 — End: 2018-02-09
  Administered 2018-02-09: 1000 mL

## 2018-02-09 MED ORDER — ROCURONIUM BROMIDE 50 MG/5ML IV SOSY
PREFILLED_SYRINGE | INTRAVENOUS | Status: DC | PRN
  Administered 2018-02-09: 30 mg via INTRAVENOUS
  Administered 2018-02-09: 10 mg via INTRAVENOUS

## 2018-02-09 MED ORDER — ASPIRIN EC 325 MG PO TBEC
325.0000 mg | DELAYED_RELEASE_TABLET | Freq: Every day | ORAL | Status: DC
  Administered 2018-02-10 – 2018-02-11 (×2): 325 mg via ORAL
  Filled 2018-02-09 (×2): qty 1

## 2018-02-09 SURGICAL SUPPLY — 51 items
ADH SKN CLS APL DERMABOND .7 (GAUZE/BANDAGES/DRESSINGS) ×2
AGENT HMST KT MTR STRL THRMB (HEMOSTASIS)
APL ESCP 34 STRL LF DISP (HEMOSTASIS)
APPLICATOR SURGIFLO ENDO (HEMOSTASIS) IMPLANT
BAG LAPAROSCOPIC 12 15 PORT 16 (BASKET) IMPLANT
BAG RETRIEVAL 12/15 (BASKET)
BAG SPEC RTRVL LRG 6X4 10 (ENDOMECHANICALS)
COVER BACK TABLE 60X90IN (DRAPES) ×3 IMPLANT
COVER TIP SHEARS 8 DVNC (MISCELLANEOUS) ×2 IMPLANT
COVER TIP SHEARS 8MM DA VINCI (MISCELLANEOUS) ×1
DERMABOND ADVANCED (GAUZE/BANDAGES/DRESSINGS) ×1
DERMABOND ADVANCED .7 DNX12 (GAUZE/BANDAGES/DRESSINGS) ×2 IMPLANT
DRAPE ARM DVNC X/XI (DISPOSABLE) ×8 IMPLANT
DRAPE COLUMN DVNC XI (DISPOSABLE) ×2 IMPLANT
DRAPE DA VINCI XI ARM (DISPOSABLE) ×4
DRAPE DA VINCI XI COLUMN (DISPOSABLE) ×1
DRAPE SHEET LG 3/4 BI-LAMINATE (DRAPES) ×3 IMPLANT
DRAPE SURG IRRIG POUCH 19X23 (DRAPES) ×3 IMPLANT
ELECT REM PT RETURN 15FT ADLT (MISCELLANEOUS) ×3 IMPLANT
GLOVE BIO SURGEON STRL SZ 6 (GLOVE) ×12 IMPLANT
GLOVE BIO SURGEON STRL SZ 6.5 (GLOVE) ×6 IMPLANT
GOWN STRL REUS W/ TWL LRG LVL3 (GOWN DISPOSABLE) ×4 IMPLANT
GOWN STRL REUS W/TWL LRG LVL3 (GOWN DISPOSABLE) ×6
HOLDER FOLEY CATH W/STRAP (MISCELLANEOUS) ×3 IMPLANT
IRRIG SUCT STRYKERFLOW 2 WTIP (MISCELLANEOUS) ×3
IRRIGATION SUCT STRKRFLW 2 WTP (MISCELLANEOUS) ×2 IMPLANT
KIT PROCEDURE DA VINCI SI (MISCELLANEOUS) ×1
KIT PROCEDURE DVNC SI (MISCELLANEOUS) IMPLANT
MANIPULATOR UTERINE 4.5 ZUMI (MISCELLANEOUS) ×3 IMPLANT
NDL SPNL 18GX3.5 QUINCKE PK (NEEDLE) ×2 IMPLANT
NEEDLE SPNL 18GX3.5 QUINCKE PK (NEEDLE) ×3 IMPLANT
OBTURATOR OPTICAL STANDARD 8MM (TROCAR) ×1
OBTURATOR OPTICAL STND 8 DVNC (TROCAR) ×2
OBTURATOR OPTICALSTD 8 DVNC (TROCAR) ×2 IMPLANT
PACK ROBOT GYN CUSTOM WL (TRAY / TRAY PROCEDURE) ×3 IMPLANT
PAD POSITIONING PINK XL (MISCELLANEOUS) ×3 IMPLANT
POUCH SPECIMEN RETRIEVAL 10MM (ENDOMECHANICALS) IMPLANT
SEAL CANN UNIV 5-8 DVNC XI (MISCELLANEOUS) ×8 IMPLANT
SEAL XI 5MM-8MM UNIVERSAL (MISCELLANEOUS) ×4
SET TRI-LUMEN FLTR TB AIRSEAL (TUBING) ×3 IMPLANT
SURGIFLO W/THROMBIN 8M KIT (HEMOSTASIS) IMPLANT
SUT VIC AB 0 CT1 27 (SUTURE)
SUT VIC AB 0 CT1 27XBRD ANTBC (SUTURE) IMPLANT
SUT VIC AB 2-0 SH 27 (SUTURE) ×6
SUT VIC AB 2-0 SH 27X BRD (SUTURE) IMPLANT
SYR 10ML LL (SYRINGE) ×3 IMPLANT
TOWEL OR NON WOVEN STRL DISP B (DISPOSABLE) ×3 IMPLANT
TRAP SPECIMEN MUCOUS 40CC (MISCELLANEOUS) IMPLANT
TRAY FOLEY W/METER SILVER 16FR (SET/KITS/TRAYS/PACK) ×3 IMPLANT
UNDERPAD 30X30 (UNDERPADS AND DIAPERS) ×3 IMPLANT
WATER STERILE IRR 1000ML POUR (IV SOLUTION) ×6 IMPLANT

## 2018-02-09 NOTE — Interval H&P Note (Signed)
History and Physical Interval Note:  02/09/2018 10:15 AM  Cassandra Baker  has presented today for surgery, with the diagnosis of ENDOMETRIAL CANCER  The various methods of treatment have been discussed with the patient and family. After consideration of risks, benefits and other options for treatment, the patient has consented to  Procedure(s): XI ROBOTIC ASSISTED TOTAL HYSTERECTOMY WITH BILATERAL SALPINGO OOPHORECTOMY (Bilateral) SENTINEL LYMPH NODE BIOPSY (N/A) as a surgical intervention .  The patient's history has been reviewed, patient examined, no change in status, stable for surgery.  I have reviewed the patient's chart and labs.  Questions were answered to the patient's satisfaction.     [name]

## 2018-02-09 NOTE — Anesthesia Procedure Notes (Signed)
Procedure Name: Intubation Date/Time: 02/09/2018 11:08 AM Performed by: Lind Covert, CRNA Pre-anesthesia Checklist: Patient identified, Emergency Drugs available, Suction available, Patient being monitored and Timeout performed Patient Re-evaluated:Patient Re-evaluated prior to induction Oxygen Delivery Method: Circle system utilized Preoxygenation: Pre-oxygenation with 100% oxygen Induction Type: IV induction Laryngoscope Size: Mac and 3 Grade View: Grade I Tube type: Oral Tube size: 7.0 mm Number of attempts: 1 Airway Equipment and Method: Stylet Placement Confirmation: ETT inserted through vocal cords under direct vision,  breath sounds checked- equal and bilateral and positive ETCO2 Secured at: 22 cm Tube secured with: Tape Dental Injury: Teeth and Oropharynx as per pre-operative assessment

## 2018-02-09 NOTE — Transfer of Care (Signed)
Immediate Anesthesia Transfer of Care Note  Patient: Cassandra Baker  Procedure(s) Performed: XI ROBOTIC ASSISTED TOTAL HYSTERECTOMY WITH BILATERAL SALPINGO OOPHORECTOMY (Bilateral ) SENTINEL LYMPH NODE BIOPSY (N/A )  Patient Location: PACU  Anesthesia Type:General  Level of Consciousness: sedated  Airway & Oxygen Therapy: Patient Spontanous Breathing and Patient connected to face mask oxygen  Post-op Assessment: Report given to RN and Post -op Vital signs reviewed and stable  Post vital signs: Reviewed and stable  Last Vitals:  Vitals:   02/09/18 0939  BP: (!) 160/68  Pulse: 90  Resp: 16  Temp: 36.8 C  SpO2: 98%    Last Pain:  Vitals:   02/09/18 1019  TempSrc:   PainSc: 0-No pain      Patients Stated Pain Goal: 4 (49/[id number])  Complications: No apparent anesthesia complications

## 2018-02-09 NOTE — Anesthesia Preprocedure Evaluation (Addendum)
Anesthesia Evaluation  Patient identified by MRN, date of birth, ID band Patient awake    Reviewed: Allergy & Precautions, NPO status , Patient's Chart, lab work & pertinent test results, reviewed documented beta blocker date and time   Airway Mallampati: II  TM Distance: >3 FB Neck ROM: Full    Dental no notable dental hx. (+) Teeth Intact   Pulmonary shortness of breath and with exertion, asthma ,    Pulmonary exam normal breath sounds clear to auscultation       Cardiovascular hypertension, Pt. on medications +CHF  Normal cardiovascular exam Rhythm:Regular Rate:Normal     Neuro/Psych negative neurological ROS  negative psych ROS   GI/Hepatic   Endo/Other  diabetes, Well Controlled, Type 2Hypothyroidism Primary hyperparathyroidism  Renal/GU Renal InsufficiencyRenal disease  negative genitourinary   Musculoskeletal  (+) Arthritis , Osteoarthritis,    Abdominal   Peds  Hematology negative hematology ROS (+)   Anesthesia Other Findings   Reproductive/Obstetrics Endometrial Ca                           Anesthesia Physical Anesthesia Plan  ASA: III  Anesthesia Plan: General   Post-op Pain Management:    Induction: Intravenous  PONV Risk Score and Plan: 4 or greater and Midazolam, Ondansetron, Dexamethasone and Treatment may vary due to age or medical condition  Airway Management Planned: Oral ETT  Additional Equipment:   Intra-op Plan:   Post-operative Plan: Extubation in OR  Informed Consent: I have reviewed the patients History and Physical, chart, labs and discussed the procedure including the risks, benefits and alternatives for the proposed anesthesia with the patient or authorized representative who has indicated his/her understanding and acceptance.   Dental advisory given  Plan Discussed with: Anesthesiologist, CRNA and Surgeon  Anesthesia Plan Comments:          Anesthesia Quick Evaluation

## 2018-02-09 NOTE — Op Note (Signed)
OPERATIVE NOTE 02/09/18  Surgeon: [name]   Assistants: Dr Lahoma Crocker (an MD assistant was necessary for tissue manipulation, management of robotic instrumentation, retraction and positioning due to the complexity of the case and hospital policies).   Anesthesia: General endotracheal anesthesia  ASA Class: 3   Pre-operative Diagnosis: endometrial cancer grade 1  Post-operative Diagnosis: same,   Operation: Robotic-assisted laparoscopic total hysterectomy with bilateral salpingoophorectomy, SLN biopsy   Surgeon: [name]  Assistant Surgeon: Lahoma Crocker MD  Anesthesia: GET  Urine Output: 200cc  Operative Findings:  : 12cm uterus, no gross extrauterine disease, normal ovaries and tubes. No suspicious retroperitoneal nodes. Palpable left inguinal lymphadenopathy.   Estimated Blood Loss:  less than 50 mL      Total IV Fluids: 800 ml         Specimens: uterus, cervix, bilateral tubes and ovaries, right external iliac SLN, left external iliac SLN #1, left external iliac SLN #2         Complications:  None; patient tolerated the procedure well.         Disposition: PACU - hemodynamically stable.  Procedure Details  The patient was seen in the Holding Room. The risks, benefits, complications, treatment options, and expected outcomes were discussed with the patient.  The patient concurred with the proposed plan, giving informed consent.  The site of surgery properly noted/marked. The patient was identified as Cassandra Baker and the procedure verified as a Robotic-assisted hysterectomy with bilateral salpingo oophorectomy with SLN biopsy. A Time Out was held and the above information confirmed.  After induction of anesthesia, the patient was draped and prepped in the usual sterile manner. Pt was placed in supine position after anesthesia and draped and prepped in the usual sterile manner. The abdominal drape was placed after the CholoraPrep had  been allowed to dry for 3 minutes.  Her arms were tucked to her side with all appropriate precautions.  The shoulders were stabilized with padded shoulder blocks applied to the acromium processes.  The patient was placed in the semi-lithotomy position in [place].  The perineum was prepped with Betadine. The patient was then prepped. Foley catheter was placed.  A sterile speculum was placed in the vagina.  The cervix was grasped with a single-tooth tenaculum. 2mg  total of ICG was injected into the cervical stroma at 2 and 9 o'clock with 1cc injected at a 1cm and 71mm depth (concentration 0.5mg /ml) in all locations. The cervix was dilated with Kennon Rounds dilators.  The ZUMI uterine manipulator with a medium colpotomizer ring was placed without difficulty.  A pneum occluder balloon was placed over the manipulator.  OG tube placement was confirmed and to suction.   Next, a 5 mm skin incision was made 1 cm below the subcostal margin in the midclavicular line.  The 5 mm Optiview port and scope was used for direct entry.  Opening pressure was under 10 mm CO2.  The abdomen was insufflated and the findings were noted as above.   At this point and all points during the procedure, the patient's intra-abdominal pressure did not exceed 15 mmHg. Next, a 10 mm skin incision was made in the umbilicus and a right and left port was placed about 10 cm lateral to the robot port on the right and left side.  A fourth arm was placed in the left lower quadrant 2 cm above and superior and medial to the anterior superior iliac spine.  All ports were placed under direct visualization.  The patient was placed in steep Trendelenburg.  Bowel was folded away into the upper abdomen.  The robot was docked in the normal manner.  The right and left peritoneum were opened parallel to the IP ligament to open the retroperitoneal spaces bilaterally. The SLN mapping was performed in bilateral pelvic basins. The para rectal and paravesical spaces were  opened up entirely with careful dissection below the level of the ureters bilaterally and to the depth of the uterine artery origin in order to skeletonize the uterine "web" and ensure visualization of all parametrial channels. The para-aortic basins were carefully exposed and evaluated for isolated para-aortic SLN's. Lymphatic channels were identified travelling to the following visualized sentinel lymph node's: right and left SLN (on the left it was 1&2). These SLN's were separated from their surrounding lymphatic tissue, removed and sent for permanent pathology.  The hysterectomy was started after the round ligament on the right side was incised and the retroperitoneum was entered and the pararectal space was developed.  The ureter was noted to be on the medial leaf of the broad ligament.  The peritoneum above the ureter was incised and stretched and the infundibulopelvic ligament was skeletonized, cauterized and cut.  The posterior peritoneum was taken down to the level of the KOH ring.  The anterior peritoneum was also taken down.  The bladder flap was created to the level of the KOH ring.  The uterine artery on the right side was skeletonized, cauterized and cut in the normal manner.  A similar procedure was performed on the left.  The colpotomy was made and the uterus, cervix, bilateral ovaries and tubes were amputated and delivered through the vagina.  Pedicles were inspected and excellent hemostasis was achieved.    The colpotomy at the vaginal cuff was closed with Vicryl on a CT1 needle in a running manner.  Irrigation was used and excellent hemostasis was achieved.  At this point in the procedure was completed.  Robotic instruments were removed under direct visulaization.  The robot was undocked. The 10 mm ports were closed with Vicryl on a UR-5 needle and the fascia was closed with 0 Vicryl on a UR-5 needle.  The skin was closed with 4-0 Vicryl in a subcuticular manner.  Dermabond was applied.   Sponge, lap and needle counts correct x 2.  The patient was taken to the recovery room in stable condition.  The vagina was swabbed with bleeding noted from the posterior introitus which was made hemostatic with 2-0 vicryl running and figure of 8 sutures.   All instrument and needle counts were correct x  3.   The patient was transferred to the recovery room in a stable condition.  [name], MD

## 2018-02-10 ENCOUNTER — Other Ambulatory Visit: Payer: Self-pay | Admitting: Gynecologic Oncology

## 2018-02-10 ENCOUNTER — Encounter (HOSPITAL_COMMUNITY): Payer: Self-pay | Admitting: Gynecologic Oncology

## 2018-02-10 DIAGNOSIS — D259 Leiomyoma of uterus, unspecified: Secondary | ICD-10-CM | POA: Diagnosis not present

## 2018-02-10 DIAGNOSIS — I130 Hypertensive heart and chronic kidney disease with heart failure and stage 1 through stage 4 chronic kidney disease, or unspecified chronic kidney disease: Secondary | ICD-10-CM | POA: Diagnosis not present

## 2018-02-10 DIAGNOSIS — C542 Malignant neoplasm of myometrium: Secondary | ICD-10-CM | POA: Diagnosis not present

## 2018-02-10 DIAGNOSIS — E1122 Type 2 diabetes mellitus with diabetic chronic kidney disease: Secondary | ICD-10-CM | POA: Diagnosis not present

## 2018-02-10 DIAGNOSIS — R7989 Other specified abnormal findings of blood chemistry: Secondary | ICD-10-CM

## 2018-02-10 DIAGNOSIS — N189 Chronic kidney disease, unspecified: Secondary | ICD-10-CM | POA: Diagnosis not present

## 2018-02-10 DIAGNOSIS — C541 Malignant neoplasm of endometrium: Secondary | ICD-10-CM | POA: Diagnosis not present

## 2018-02-10 LAB — BASIC METABOLIC PANEL
ANION GAP: 10 (ref 5–15)
Anion gap: 12 (ref 5–15)
BUN: 67 mg/dL — AB (ref 6–20)
BUN: 73 mg/dL — ABNORMAL HIGH (ref 6–20)
CHLORIDE: 103 mmol/L (ref 101–111)
CHLORIDE: 101 mmol/L (ref 101–111)
CO2: 20 mmol/L — ABNORMAL LOW (ref 22–32)
CO2: 20 mmol/L — AB (ref 22–32)
Calcium: 8.6 mg/dL — ABNORMAL LOW (ref 8.9–10.3)
Calcium: 8.3 mg/dL — ABNORMAL LOW (ref 8.9–10.3)
Creatinine, Ser: 3.23 mg/dL — ABNORMAL HIGH (ref 0.44–1.00)
Creatinine, Ser: 3.29 mg/dL — ABNORMAL HIGH (ref 0.44–1.00)
GFR calc Af Amer: 15 mL/min — ABNORMAL LOW (ref >60)
GFR calc non Af Amer: 13 mL/min — ABNORMAL LOW (ref >60)
GFR calc non Af Amer: 13 mL/min — ABNORMAL LOW (ref >60)
GFR, EST AFRICAN AMERICAN: 15 mL/min — AB (ref >60)
GLUCOSE: 202 mg/dL — AB (ref 65–99)
Glucose, Bld: 134 mg/dL — ABNORMAL HIGH (ref 65–99)
POTASSIUM: 4.8 mmol/L (ref 3.5–5.1)
Potassium: 4.9 mmol/L (ref 3.5–5.1)
SODIUM: 131 mmol/L — AB (ref 135–145)
Sodium: 135 mmol/L (ref 135–145)

## 2018-02-10 LAB — CBC
HEMATOCRIT: 30.5 % — AB (ref 36.0–46.0)
HEMOGLOBIN: 9.7 g/dL — AB (ref 12.0–15.0)
MCH: 27.1 pg (ref 26.0–34.0)
MCHC: 31.8 g/dL (ref 30.0–36.0)
MCV: 85.2 fL (ref 78.0–100.0)
Platelets: 222 10*3/uL (ref 150–400)
RBC: 3.58 MIL/uL — AB (ref 3.87–5.11)
RDW: 15.7 % — ABNORMAL HIGH (ref 11.5–15.5)
WBC: 12.3 10*3/uL — ABNORMAL HIGH (ref 4.0–10.5)

## 2018-02-10 LAB — GLUCOSE, CAPILLARY
GLUCOSE-CAPILLARY: 143 mg/dL — AB (ref 65–99)
GLUCOSE-CAPILLARY: 181 mg/dL — AB (ref 65–99)
Glucose-Capillary: 184 mg/dL — ABNORMAL HIGH (ref 65–99)
Glucose-Capillary: 165 mg/dL — ABNORMAL HIGH (ref 65–99)

## 2018-02-10 MED ORDER — DEXTROSE-NACL 5-0.45 % IV SOLN
INTRAVENOUS | Status: DC
  Administered 2018-02-10: 1000 mL via INTRAVENOUS

## 2018-02-10 MED ORDER — FUROSEMIDE 20 MG PO TABS
20.0000 mg | ORAL_TABLET | Freq: Once | ORAL | Status: AC
  Administered 2018-02-10: 20 mg via ORAL
  Filled 2018-02-10: qty 1

## 2018-02-10 MED ORDER — TRAMADOL HCL 50 MG PO TABS: 50.0000 mg | ORAL_TABLET | Freq: Four times a day (QID) | ORAL | 0 refills | Status: DC | PRN

## 2018-02-10 NOTE — Progress Notes (Signed)
Repeat Bmet ordered for 14:00 resulting creatinine at 3.29.  Results reviewed by Dr. Denman George.  Patient was able to void 200 cc at 16:00 which was around 8 hours per RN after foley removal.  Per Dr. Denman George, plan to replace IV, start D51/2NS at 75 cc/hr, monitor urine output, hold discharge at this time.

## 2018-02-10 NOTE — Progress Notes (Signed)
1 Day Post-Op Procedure(s) (LRB): XI ROBOTIC ASSISTED TOTAL HYSTERECTOMY WITH BILATERAL SALPINGO OOPHORECTOMY (Bilateral) SENTINEL LYMPH NODE BIOPSY (N/A)  Subjective: Patient reports minimal pain this am.  Tolerating diet with no nausea or emesis.  Complaining of left arm tightness and drainage from IV site.  Up with assistance.  Due to void since foley removal.      Objective: Vital signs in last 24 hours: Temp:  [97.5 F (36.4 C)-98.6 F (37 C)] 98.6 F (37 C) (02/20 1400) Pulse Rate:  [86-103] 86 (02/20 1400) Resp:  [14-18] 17 (02/20 1400) BP: (116-144)/(50-64) 119/56 (02/20 1400) SpO2:  [95 %-99 %] 97 % (02/20 1400) Last BM Date: 02/08/18  Intake/Output from previous day: 02/19 0701 - 02/20 0700 In: 2160 [P.O.:360; I.V.:1550; IV Piggyback:250] Out: 1325 [Urine:1250; Blood:75]  Physical Examination: General: alert, cooperative and no distress Resp: clear to auscultation bilaterally Cardio: regular rate and rhythm, S1, S2 normal, no murmur, click, rub or gallop GI: incision: lap sites to the abdomen with dermabond without erythema or drainage and , abdomen firm and round, active bowel sounds, non-tympanic Extremities: no bilateral lower extrem edema.  Moderate edema in left hand and wrist from IV site.  IV site dripping serosanguinous drainage underneath dressing, unable to clinch hand completely due to swelling in hand, range of motion intact along with sensation noted  Labs: WBC/Hgb/Hct/Plts:  12.3/9.7/30.5/222 (02/20 9924) BUN/Cr/glu/ALT/AST/amyl/lip:  73/3.29/--/--/--/--/-- (02/20 1407)  Assessment: 77 y.o. s/p Procedure(s): XI ROBOTIC ASSISTED TOTAL HYSTERECTOMY WITH BILATERAL SALPINGO OOPHORECTOMY SENTINEL LYMPH NODE BIOPSY: stable Pain:  Pain is well-controlled on PRN medications.  Heme: Hgb 9.7 and Hct 30.5 this am. Stable post-operatively.  CV: BP and HR stable.  Continue to monitor with ordered vital signs.  GI:  Tolerating po: Yes.  Antiemetics ordered  PRN.  GU: Due to void since foley removal.  Creatinine 3.23 this am.    FEN: Stable post-operatively.  Endo: Diabetes mellitus Type II, under good control..  CBG:  CBG (last 3)  Recent Labs    02/09/18 2129 02/10/18 0756 02/10/18 1211  GLUCAP 230* 184* 165*  .  Prophylaxis: intermittent pneumatic compression boots.  Plan: Diet as tolerated IV infiltrated-left arm elevated Due to void Encourage ambulation, IS use, deep breathing, and coughing Plan for possible discharge after lunch with plan for repeat Cmet on Friday at the [facility] to re-evaluate serum creatinine.   LOS: 0 days    Dorothyann Gibbs 02/10/2018, 4:00 PM

## 2018-02-10 NOTE — Discharge Instructions (Addendum)
02/09/2018  Return to work: 4 weeks  Activity: 1. Be up and out of the bed during the day.  Take a nap if needed.  You may walk up steps but be careful and use the hand rail.  Stair climbing will tire you more than you think, you may need to stop part way and rest.   2. No lifting or straining for 6 weeks.  3. No driving for 1 weeks.  Do Not drive if you are taking narcotic pain medicine.  4. Shower daily.  Use soap and water on your incision and pat dry; don't rub.   5. No sexual activity and nothing in the vagina for 8 weeks.  Medications:  - Take tylenol first line for pain control. Take these regularly (every 6 hours) to decrease the build up of pain. Avoid ibuprofen except in small doses due to your kidney dysfunction.   - If necessary, for severe pain not relieved by tylenol, take tramadol.  - While taking tramadol you should take sennakot every night to reduce the likelihood of constipation. If this causes diarrhea, stop its use.  Diet: 1. Low sodium Heart Healthy Diet is recommended.  2. It is safe to use a laxative if you have difficulty moving your bowels.   Wound Care: 1. Keep clean and dry.  Shower daily.  Reasons to call the Doctor:   Fever - Oral temperature greater than 100.4 degrees Fahrenheit  Foul-smelling vaginal discharge  Difficulty urinating  Nausea and vomiting  Increased pain at the site of the incision that is unrelieved with pain medicine.  Difficulty breathing with or without chest pain  New calf pain especially if only on one side  Sudden, continuing increased vaginal bleeding with or without clots.   Follow-up:  1. Plan to come to the [facility] on Tuesday for a recheck of your kidney function labs.  2. See Everitt Amber in 2 weeks.  Contacts: For questions or concerns you should contact:  Dr. Everitt Amber at [phone] After hours and on week-ends call [phone] and ask to speak to the physician on call for Gynecologic  Oncology   Tramadol tablets What is this medicine? TRAMADOL (TRA ma dole) is a pain reliever. It is used to treat moderate to severe pain in adults. This medicine may be used for other purposes; ask your health care provider or pharmacist if you have questions. COMMON BRAND NAME(S): Ultram What should I tell my health care provider before I take this medicine? They need to know if you have any of these conditions: -brain tumor -depression -drug abuse or addiction -head injury -if you frequently drink alcohol containing drinks -kidney disease or trouble passing urine -liver disease -lung disease, asthma, or breathing problems -seizures or epilepsy -suicidal thoughts, plans, or attempt; a previous suicide attempt by you or a family member -an unusual or allergic reaction to tramadol, codeine, other medicines, foods, dyes, or preservatives -pregnant or trying to get pregnant -breast-feeding How should I use this medicine? Take this medicine by mouth with a full glass of water. Follow the directions on the prescription label. You can take it with or without food. If it upsets your stomach, take it with food. Do not take your medicine more often than directed. A special MedGuide will be given to you by the pharmacist with each prescription and refill. Be sure to read this information carefully each time. Talk to your pediatrician regarding the use of this medicine in children. Special care may  be needed. Overdosage: If you think you have taken too much of this medicine contact a poison control center or emergency room at once. NOTE: This medicine is only for you. Do not share this medicine with others. What if I miss a dose? If you miss a dose, take it as soon as you can. If it is almost time for your next dose, take only that dose. Do not take double or extra doses. What may interact with this medicine? Do not take this medication with any of the following medicines: -MAOIs like Carbex,  Eldepryl, Marplan, Nardil, and Parnate This medicine may also interact with the following medications: -alcohol -antihistamines for allergy, cough and cold -certain medicines for anxiety or sleep -certain medicines for depression like amitriptyline, fluoxetine, sertraline -certain medicines for migraine headache like almotriptan, eletriptan, frovatriptan, naratriptan, rizatriptan, sumatriptan, zolmitriptan -certain medicines for seizures like carbamazepine, oxcarbazepine, phenobarbital, primidone -certain medicines that treat or prevent blood clots like warfarin -digoxin -furazolidone -general anesthetics like halothane, isoflurane, methoxyflurane, propofol -linezolid -local anesthetics like lidocaine, pramoxine, tetracaine -medicines that relax muscles for surgery -other narcotic medicines for pain or cough -phenothiazines like chlorpromazine, mesoridazine, prochlorperazine, thioridazine -procarbazine This list may not describe all possible interactions. Give your health care provider a list of all the medicines, herbs, non-prescription drugs, or dietary supplements you use. Also tell them if you smoke, drink alcohol, or use illegal drugs. Some items may interact with your medicine. What should I watch for while using this medicine? Tell your doctor or health care professional if your pain does not go away, if it gets worse, or if you have new or a different type of pain. You may develop tolerance to the medicine. Tolerance means that you will need a higher dose of the medicine for pain relief. Tolerance is normal and is expected if you take this medicine for a long time. Do not suddenly stop taking your medicine because you may develop a severe reaction. Your body becomes used to the medicine. This does NOT mean you are addicted. Addiction is a behavior related to getting and using a drug for a non-medical reason. If you have pain, you have a medical reason to take pain medicine. Your doctor  will tell you how much medicine to take. If your doctor wants you to stop the medicine, the dose will be slowly lowered over time to avoid any side effects. There are different types of narcotic medicines (opiates). If you take more than one type at the same time or if you are taking another medicine that also causes drowsiness, you may have more side effects. Give your health care provider a list of all medicines you use. Your doctor will tell you how much medicine to take. Do not take more medicine than directed. Call emergency for help if you have problems breathing or unusual sleepiness. You may get drowsy or dizzy. Do not drive, use machinery, or do anything that needs mental alertness until you know how this medicine affects you. Do not stand or sit up quickly, especially if you are an older patient. This reduces the risk of dizzy or fainting spells. Alcohol can increase or decrease the effects of this medicine. Avoid alcoholic drinks. You may have constipation. Try to have a bowel movement at least every 2 to 3 days. If you do not have a bowel movement for 3 days, call your doctor or health care professional. Your mouth may get dry. Chewing sugarless gum or sucking hard candy, and drinking plenty of  water may help. Contact your doctor if the problem does not go away or is severe. What side effects may I notice from receiving this medicine? Side effects that you should report to your doctor or health care professional as soon as possible: -allergic reactions like skin rash, itching or hives, swelling of the face, lips, or tongue -breathing problems -confusion -seizures -signs and symptoms of low blood pressure like dizziness; feeling faint or lightheaded, falls; unusually weak or tired -trouble passing urine or change in the amount of urine Side effects that usually do not require medical attention (report to your doctor or health care professional if they continue or are  bothersome): -constipation -dry mouth -nausea, vomiting -tiredness This list may not describe all possible side effects. Call your doctor for medical advice about side effects. You may report side effects to FDA at 1-800-FDA-1088. Where should I keep my medicine? Keep out of the reach of children. This medicine may cause accidental overdose and death if it taken by other adults, children, or pets. Mix any unused medicine with a substance like cat litter or coffee grounds. Then throw the medicine away in a sealed container like a sealed bag or a coffee can with a lid. Do not use the medicine after the expiration date. Store at room temperature between 15 and 30 degrees C (59 and 86 degrees F). NOTE: This sheet is a summary. It may not cover all possible information. If you have questions about this medicine, talk to your doctor, pharmacist, or health care provider.  2018 [employer]/Gold Standard (2015-09-02 09:00:04)

## 2018-02-10 NOTE — Discharge Summary (Addendum)
Physician Discharge Summary  Patient ID: Cassandra Baker MRN: [id number] DOB/AGE: 1941-02-24 77 y.o.  Admit date: 02/09/2018 Discharge date: 02/10/2018  Admission Diagnoses: Endometrial cancer [facility])  Discharge Diagnoses:  Principal Problem:   Endometrial cancer [facility]) Active Problems:   Lymphadenopathy, inguinal   Discharged Condition:  The patient is in good condition and stable for discharge.   Hospital Course: On 02/09/2018, the patient underwent the following: Procedure(s): XI ROBOTIC ASSISTED TOTAL HYSTERECTOMY WITH BILATERAL SALPINGO OOPHORECTOMY SENTINEL LYMPH NODE BIOPSY.  The postoperative course was uneventful.  She was discharged to home on postoperative day 1 tolerating a regular diet, ambulating, voiding, pain controlled.  She is to follow up on Friday with a repeat CMET to evaluate kidney function per Dr. Denman George.  Consults: None  Significant Diagnostic Studies: None  Treatments: surgery: see above  Discharge Exam: Blood pressure (!) 135/50, pulse 96, temperature 98.5 F (36.9 C), temperature source Oral, resp. rate 18, height 5\' 4"  (1.626 m), weight 173 lb (78.5 kg), SpO2 96 %. General appearance: alert, cooperative and no distress Resp: clear to auscultation bilaterally Cardio: regular rate and rhythm, S1, S2 normal, no murmur, click, rub or gallop GI: abdomen slightly firm, active bowel sounds, non-tympanic, non tender Extremities: extremities normal, atraumatic, no cyanosis or edema Incision/Wound: Lap sites to the abdomen with dermabond without erythema or drainage.  Disposition: 01-Home or Self Care  Discharge Instructions    Call MD for:  difficulty breathing, headache or visual disturbances   Complete by:  As directed    Call MD for:  extreme fatigue   Complete by:  As directed    Call MD for:  hives   Complete by:  As directed    Call MD for:  persistant dizziness or light-headedness   Complete by:  As directed    Call MD for:  persistant nausea and  vomiting   Complete by:  As directed    Call MD for:  redness, tenderness, or signs of infection (pain, swelling, redness, odor or green/yellow discharge around incision site)   Complete by:  As directed    Call MD for:  severe uncontrolled pain   Complete by:  As directed    Call MD for:  temperature >100.4   Complete by:  As directed    Diet - low sodium heart healthy   Complete by:  As directed    Discharge instructions   Complete by:  As directed    Plan to come to the [place] on Friday morning for a recheck of your kidney function labs.  Increase fluid intake.   Driving Restrictions   Complete by:  As directed    No driving for 1 week if cleared to drive prior to surgery.  Do not take narcotics and drive.   Increase activity slowly   Complete by:  As directed    Lifting restrictions   Complete by:  As directed    No lifting greater than 10 lbs.   Sexual Activity Restrictions   Complete by:  As directed    No sexual activity, nothing in the vagina, for 8 weeks.     Allergies as of 02/10/2018      Reactions   Plum Pulp Anaphylaxis, Cough   Citrus    Coughing, chest tightness    Eggs Or Egg-derived Products Itching   congestion   Penicillins Rash   Has patient had a PCN reaction causing immediate rash, facial/tongue/throat swelling, SOB or lightheadedness with hypotension: Yes Has patient had  a PCN reaction causing severe rash involving mucus membranes or skin necrosis: No Has patient had a PCN reaction that required hospitalization: No Has patient had a PCN reaction occurring within the last 10 years: No If all of the above answers are "NO", then may proceed with Cephalosporin use.    DAVS: Troubleshooting Information    AVS troubleshooting information  Refreshed 2018-02-10 13:30:31 EST   Data Value   Patient ID, internal format "    [id number]"   Patient DAT of contact being viewed [phone]   Patient CSN of contact being viewed [id number]   Mode OP_MEDS   Discharge IEV  record [id number]   Contact type INP       DAVS: Context  IP   Data Value     Basic   LPG ID CHL IP DISCHARGE AVS [name] [[id number]]   pData ^Eprivate([id number])   Calling context ""   Filter LPP IP MED SEARCH DISCHARGE FILTER [phone]   Filter code $$FilterNonDischDisc^LMIUTIL1(ordId,eptID,eptDAT)     Inpatient   Is admission contact Yes [1]   Contact has been admitted Yes [1]   Contact has been discharged No [0]   Is on Leave of Absence No [0]   Admission IEV [id number]   Discharge IEV [id number]   DAT of patient's current admission [phone]     Emergency Department   IsED (contact) No [0]   IsCurED (patient) No [0]   PatientHasArrivedInED (only applies to ED contacts) No [0]     [place] No [0]   Appt status ""   ADT status Admitted   HOV as IP or AMB Treat as inpatient     Ambulatory   Is Ambulatory Contact No [0]   Is Encounter Closed No [0]     Surgery   Is surgery (type 51) contact? No [0]   Has linked surgery? Yes [1]   Is thin log? No [0]   Surgery DAT [phone]   Linked ORL Log [phone] [[id number]]   Linked [facility] Case [id number] [[id number]]       DAVS: Snapshot  Live   Data Value   Showing a snapshot? No [0]   Snapshot strategy As of now [1]   IEV record [id number]   Most recent snapshot line for patient (in "OP_MEDS" mode) ""   Absolute last snapshot line (in "OP_MEDS" mode) ""       DAVS: Generating the med list    ID Order Details   [id number] furosemide (LASIX) 40 MG tablet Found by the discharge search as a PTA order.   [id number] ramipril (ALTACE) 10 MG capsule Found by the discharge search as a PTA order.   [id number] FLUoxetine (PROZAC) 10 MG capsule Found by the discharge search as a PTA order.   [id number] allopurinol (ZYLOPRIM) 100 MG tablet Found by the discharge search as a PTA order.   [id number] aspirin 325 MG EC tablet Found by the discharge search as a PTA order.   [id number] levothyroxine (SYNTHROID, LEVOTHROID) 112 MCG tablet Found by the  discharge search as a PTA order.   [id number] hydrOXYzine (ATARAX/VISTARIL) 10 MG tablet Found by the discharge search as a PTA order.   [id number] repaglinide (PRANDIN) 0.5 MG tablet Found by the discharge search as a PTA order.   [id number] cholecalciferol (VITAMIN D) 1000 units tablet Found by the discharge search as a PTA order.   [id number] loratadine (CLARITIN REDITABS) 10 MG dissolvable tablet Found by the discharge search as a PTA order.   [id number]  clobetasol cream (TEMOVATE) 0.05 % Found by the discharge search as a PTA order.   [id number] levofloxacin (LEVAQUIN) 250 MG tablet Found by the discharge search as a PTA order.   [id number] traMADol (ULTRAM) 50 MG tablet Found by the discharge search as a non-PTA order.       DAVS: Med list    Name In ORDERS node? In PTA_MEDS node?   furosemide (LASIX) 40 MG tablet YES YES   ramipril (ALTACE) 10 MG capsule YES YES   FLUoxetine (PROZAC) 10 MG capsule YES YES   allopurinol (ZYLOPRIM) 100 MG tablet YES YES   aspirin 325 MG EC tablet YES YES   levothyroxine (SYNTHROID, LEVOTHROID) 112 MCG tablet YES YES   hydrOXYzine (ATARAX/VISTARIL) 10 MG tablet YES YES   repaglinide (PRANDIN) 0.5 MG tablet YES YES   cholecalciferol (VITAMIN D) 1000 units tablet YES YES   loratadine (CLARITIN REDITABS) 10 MG dissolvable tablet YES YES   clobetasol cream (TEMOVATE) 0.05 % YES YES   levofloxacin (LEVAQUIN) 250 MG tablet YES YES   traMADol (ULTRAM) 50 MG tablet YES NO   (13 orders in ORDERS; 12 orders in PTA_MEDS)       DAVS: Pairing PTA orders with non-PTA orders (show by pair)    Name Name   furosemide (LASIX) 40 MG tablet    ramipril (ALTACE) 10 MG capsule    FLUoxetine (PROZAC) 10 MG capsule    allopurinol (ZYLOPRIM) 100 MG tablet    aspirin 325 MG EC tablet    levothyroxine (SYNTHROID, LEVOTHROID) 112 MCG tablet    hydrOXYzine (ATARAX/VISTARIL) 10 MG tablet    repaglinide (PRANDIN) 0.5 MG tablet    cholecalciferol (VITAMIN D) 1000 units tablet     loratadine (CLARITIN REDITABS) 10 MG dissolvable tablet    clobetasol cream (TEMOVATE) 0.05 %    levofloxacin (LEVAQUIN) 250 MG tablet     traMADol (ULTRAM) 50 MG tablet       DAVS: Pairing PTA orders with non-PTA orders (show by order)    Name In pair? PTA pairs Take Home pairs   furosemide (LASIX) 40 MG tablet Yes 1    ramipril (ALTACE) 10 MG capsule Yes 2    FLUoxetine (PROZAC) 10 MG capsule Yes 3    allopurinol (ZYLOPRIM) 100 MG tablet Yes 4    aspirin 325 MG EC tablet Yes 5    levothyroxine (SYNTHROID, LEVOTHROID) 112 MCG tablet Yes 6    hydrOXYzine (ATARAX/VISTARIL) 10 MG tablet Yes 7    repaglinide (PRANDIN) 0.5 MG tablet Yes 8    cholecalciferol (VITAMIN D) 1000 units tablet Yes 9    loratadine (CLARITIN REDITABS) 10 MG dissolvable tablet Yes 10    clobetasol cream (TEMOVATE) 0.05 % Yes 11    levofloxacin (LEVAQUIN) 250 MG tablet Yes 12    traMADol (ULTRAM) 50 MG tablet Yes  13   (13 orders)       DAVS: Calculating dispositions (first pass)    Action Details Action Details Disposition   Resume at Discharge [152] LT, lpp-allow      Resume at Discharge [152] LT, lpp-allow      Resume at Discharge [152] LT, lpp-allow      Resume at Discharge [152] LT, lpp-allow      Resume at Discharge [152] lpp-allow      Resume at Discharge [152] LT, lpp-allow      Resume at Discharge [152] lpp-allow      Resume at Discharge [152] LT, lpp-allow  Resume at Discharge [152] lpp-allow      Resume at Discharge [152] LT, lpp-allow      Resume at Discharge [152] lpp-allow      Resume at Discharge [152] lpp-allow        New at Discharge [155]  START   Key    Mnemonic Meaning   comb-from a STOP order that was combined with a START order   comb-to a START order that was combined with a STOP order   conc-enc from an encounter concurrent to this one   disc discontinued   dup-stop filtered out because it was a STOP order that duplicated another order   exp expired   lpp-allow the filter  LPP decided not to hide this order   lpp-hide the filter LPP decided to hide this order   lpp-skip the order was not evaluated by the filter LPP because the order is part of a modification   LT medication is marked as long-term   no-rev-req no review required   quest unclear whether the order should be on the PTA list   unrev not reviewed for discharge           DAVS: Equivalency group adjustments    Group: Levothyroxine Sodium ([id number])    Order Old disposition New disposition Other Order   [id number] CONTINUE --Unchanged--        Group: Furosemide ([id number])    Order Old disposition New disposition Other Order   [id number] CONTINUE --Unchanged--        Group: Ramipril ([id number])    Order Old disposition New disposition Other Order   [id number] CONTINUE --Unchanged--        Group: FLUoxetine HCl ([id number])    Order Old disposition New disposition Other Order   [id number] CONTINUE --Unchanged--        Group: Cholecalciferol ([id number])    Order Old disposition New disposition Other Order   [id number] CONTINUE --Unchanged--        Group: Allopurinol ([id number])    Order Old disposition New disposition Other Order   [id number] CONTINUE --Unchanged--        Group: Aspirin ([id number])    Order Old disposition New disposition Other Order   [id number] CONTINUE --Unchanged--        Group: HydrOXYzine HCl ([id number])    Order Old disposition New disposition Other Order   [id number] CONTINUE --Unchanged--        Group: Repaglinide ([id number])    Order Old disposition New disposition Other Order   [id number] CONTINUE --Unchanged--        Group: Loratadine ([id number])    Order Old disposition New disposition Other Order   [id number] CONTINUE --Unchanged--        Group: Clobetasol Propionate ([id number])    Order Old disposition New disposition Other Order   [id number] CONTINUE --Unchanged--        Group: LevoFLOXacin ([id number])    Order Old disposition New  disposition Other Order   [id number] CONTINUE --Unchanged--        Group: TraMADol HCl ([id number])    Order Old disposition New disposition Other Order   [id number] START --Unchanged--            DAVS: Calculating dispositions (second pass)    Action Details Action Details Disposition   Resume at Discharge [152] LT, lpp-allow      Resume at Discharge [152] LT, lpp-allow      Resume at Discharge [152] LT, lpp-allow      Resume at Discharge [152] LT, lpp-allow  Resume at Discharge [152] lpp-allow      Resume at Discharge [152] LT, lpp-allow      Resume at Discharge [152] lpp-allow      Resume at Discharge [152] LT, lpp-allow      Resume at Discharge [152] lpp-allow      Resume at Discharge [152] LT, lpp-allow      Resume at Discharge [152] lpp-allow      Resume at Discharge [152] lpp-allow        New at Discharge [place]   comb-from a STOP order that was combined with a START order   comb-to a START order that was combined with a STOP order   conc-enc from an encounter concurrent to this one   disc discontinued   dup-stop filtered out because it was a STOP order that duplicated another order   exp expired   lpp-allow the filter LPP decided not to hide this order   lpp-hide the filter LPP decided to hide this order   lpp-skip the order was not evaluated by the filter LPP because the order is part of a modification   LT medication is marked as long-term   no-rev-req no review required   quest unclear whether the order should be on the PTA list   unrev not reviewed for discharge              Medication List    TAKE these medications   allopurinol 100 MG tablet Commonly known as:  ZYLOPRIM Take 200 mg by mouth every evening. Order ID: [id number]   aspirin 325 MG EC tablet Take 325 mg by mouth daily. Order ID: [id number]   cholecalciferol 1000 units tablet Commonly known as:  VITAMIN D Take 1,000 Units by mouth daily. Order ID:  [id number]   clobetasol cream 0.05 % Commonly known as:  TEMOVATE Apply 1 application topically daily as needed (itching). Order ID: [id number]   FLUoxetine 10 MG capsule Commonly known as:  PROZAC Take 10 mg by mouth at bedtime. Order ID: [id number]   furosemide 40 MG tablet Commonly known as:  LASIX Take 80 mg by mouth 2 (two) times daily. Order ID: [id number]   hydrOXYzine 10 MG tablet Commonly known as:  ATARAX/VISTARIL Take 10 mg by mouth at bedtime as needed for itching. Order ID: [id number]   levofloxacin 250 MG tablet Commonly known as:  LEVAQUIN Take 1 tablet (250 mg total) by mouth daily. Order ID: [id number]   levothyroxine 112 MCG tablet Commonly known as:  SYNTHROID, LEVOTHROID Take 112 mcg by mouth daily before breakfast. Order ID: [id number]   loratadine 10 MG dissolvable tablet Commonly known as:  CLARITIN REDITABS Take 10 mg by mouth daily as needed for allergies. Order ID: [id number]   ramipril 10 MG capsule Commonly known as:  ALTACE Take 10 mg by mouth at bedtime. Order ID: [id number]   repaglinide 0.5 MG tablet Commonly known as:  PRANDIN Take 0.5 mg by mouth daily as needed (large carb meal). Order ID: [id number]   traMADol 50 MG tablet Commonly known as:  ULTRAM Take 1 tablet (50 mg total) by mouth every 6 (six) hours as needed. Order ID: [id number]        Greater than thirty minutes were spend for face to face discharge instructions and discharge orders/summary in EPIC.   Signed: Dorothyann Gibbs 02/10/2018, 1:30 PM   Patient seen and examiend by me, agree with the above. Discharge postponed due  to difficulty voiding from oliguria. In the setting of an elevated creatinine, the patient was kept inhouse, an IV was restarted and labs were redrawn. She will be considered for discharge tomorrow if she is voiding with improved urine output and stable or decreasing creatinine.   [name], MD

## 2018-02-11 DIAGNOSIS — N189 Chronic kidney disease, unspecified: Secondary | ICD-10-CM | POA: Diagnosis not present

## 2018-02-11 DIAGNOSIS — C542 Malignant neoplasm of myometrium: Secondary | ICD-10-CM | POA: Diagnosis not present

## 2018-02-11 DIAGNOSIS — I130 Hypertensive heart and chronic kidney disease with heart failure and stage 1 through stage 4 chronic kidney disease, or unspecified chronic kidney disease: Secondary | ICD-10-CM | POA: Diagnosis not present

## 2018-02-11 DIAGNOSIS — C541 Malignant neoplasm of endometrium: Secondary | ICD-10-CM | POA: Diagnosis not present

## 2018-02-11 DIAGNOSIS — D259 Leiomyoma of uterus, unspecified: Secondary | ICD-10-CM | POA: Diagnosis not present

## 2018-02-11 DIAGNOSIS — E1122 Type 2 diabetes mellitus with diabetic chronic kidney disease: Secondary | ICD-10-CM | POA: Diagnosis not present

## 2018-02-11 LAB — BASIC METABOLIC PANEL
ANION GAP: 12 (ref 5–15)
BUN: 80 mg/dL — ABNORMAL HIGH (ref 6–20)
CALCIUM: 8.7 mg/dL — AB (ref 8.9–10.3)
CO2: 20 mmol/L — AB (ref 22–32)
Chloride: 102 mmol/L (ref 101–111)
Creatinine, Ser: 3.17 mg/dL — ABNORMAL HIGH (ref 0.44–1.00)
GFR, EST AFRICAN AMERICAN: 15 mL/min — AB (ref >60)
GFR, EST NON AFRICAN AMERICAN: 13 mL/min — AB (ref >60)
Glucose, Bld: 138 mg/dL — ABNORMAL HIGH (ref 65–99)
Potassium: 4.6 mmol/L (ref 3.5–5.1)
SODIUM: 134 mmol/L — AB (ref 135–145)

## 2018-02-11 LAB — GLUCOSE, CAPILLARY: Glucose-Capillary: 136 mg/dL — ABNORMAL HIGH (ref 65–99)

## 2018-02-11 NOTE — Discharge Summary (Signed)
Physician Discharge Summary  Patient ID: Cassandra Baker MRN: [id number] DOB/AGE: 12/29/1940 77 y.o.  Admit date: 02/09/2018 Discharge date: 02/11/2018  Admission Diagnoses: Endometrial cancer [facility])  Discharge Diagnoses:  Principal Problem:   Endometrial cancer [facility]) Active Problems:   Lymphadenopathy, inguinal   Discharged Condition: good  Hospital Course:  1/ patient was admitted on 02/09/18 for robotic hysterectomy, BSO, SLN biopsy for endometrial cancer 2/ surgery was uncomplicated  3/ on postoperative day 1 the patient had an elevated creatinine (above her baseline elevated creatinine) and low urine output. IVF's were restarted as was lasix. Her creatinine was serially checked and remained stable and her UO improved.   On POD 2 the patient was meeting discharge criteria: tolerating PO, voiding urine, ambulating, pain well controlled on oral medications.  4/ new medications on discharge include tylenol and percocet.  She will follow-up for repeat creatinine check in 1-2 weeks.   Consults: None  Significant Diagnostic Studies: labs:  BMET    Component Value Date/Time   NA 134 (L) 02/11/2018 0458   NA 136 03/03/2014 1004   K 4.6 02/11/2018 0458   K 4.4 03/03/2014 1004   CL 102 02/11/2018 0458   CL 106 03/03/2014 1004   CO2 20 (L) 02/11/2018 0458   CO2 25 03/03/2014 1004   GLUCOSE 138 (H) 02/11/2018 0458   GLUCOSE 212 (H) 03/03/2014 1004   BUN 80 (H) 02/11/2018 0458   BUN 44 (H) 03/03/2014 1004   CREATININE 3.17 (H) 02/11/2018 0458   CREATININE 1.86 (H) 03/03/2014 1004   CALCIUM 8.7 (L) 02/11/2018 0458   CALCIUM 9.5 03/03/2014 1004   GFRNONAA 13 (L) 02/11/2018 0458   GFRNONAA 26 (L) 03/03/2014 1004   GFRAA 15 (L) 02/11/2018 0458   GFRAA 31 (L) 03/03/2014 1004      Treatments: IV hydration and surgery: see above  Discharge Exam: Blood pressure 130/67, pulse 83, temperature 97.8 F (36.6 C), temperature source Oral, resp. rate 18, height 5\' 4"  (1.626 m),  weight 173 lb (78.5 kg), SpO2 97 %. General appearance: alert and cooperative GI: soft, non-tender; bowel sounds normal; no masses,  no organomegaly Incision/Wound: clean dry and in tact x 5  Disposition: 01-Home or Self Care  Discharge Instructions    Call MD for:  difficulty breathing, headache or visual disturbances   Complete by:  As directed    Call MD for:  extreme fatigue   Complete by:  As directed    Call MD for:  hives   Complete by:  As directed    Call MD for:  persistant dizziness or light-headedness   Complete by:  As directed    Call MD for:  persistant nausea and vomiting   Complete by:  As directed    Call MD for:  redness, tenderness, or signs of infection (pain, swelling, redness, odor or green/yellow discharge around incision site)   Complete by:  As directed    Call MD for:  severe uncontrolled pain   Complete by:  As directed    Call MD for:  temperature >100.4   Complete by:  As directed    Diet - low sodium heart healthy   Complete by:  As directed    Discharge instructions   Complete by:  As directed    Plan to come to the [facility] on Friday morning for a recheck of your kidney function labs.  Increase fluid intake.   Driving Restrictions   Complete by:  As directed  No driving for 1 week if cleared to drive prior to surgery.  Do not take narcotics and drive.   Increase activity slowly   Complete by:  As directed    Lifting restrictions   Complete by:  As directed    No lifting greater than 10 lbs.   Sexual Activity Restrictions   Complete by:  As directed    No sexual activity, nothing in the vagina, for 8 weeks.     Allergies as of 02/11/2018      Reactions   Plum Pulp Anaphylaxis, Cough   Citrus    Coughing, chest tightness    Eggs Or Egg-derived Products Itching   congestion   Penicillins Rash   Has patient had a PCN reaction causing immediate rash, facial/tongue/throat swelling, SOB or lightheadedness with hypotension: Yes Has  patient had a PCN reaction causing severe rash involving mucus membranes or skin necrosis: No Has patient had a PCN reaction that required hospitalization: No Has patient had a PCN reaction occurring within the last 10 years: No If all of the above answers are "NO", then may proceed with Cephalosporin use.      Medication List    TAKE these medications   allopurinol 100 MG tablet Commonly known as:  ZYLOPRIM Take 200 mg by mouth every evening.   aspirin 325 MG EC tablet Take 325 mg by mouth daily.   cholecalciferol 1000 units tablet Commonly known as:  VITAMIN D Take 1,000 Units by mouth daily.   clobetasol cream 0.05 % Commonly known as:  TEMOVATE Apply 1 application topically daily as needed (itching).   FLUoxetine 10 MG capsule Commonly known as:  PROZAC Take 10 mg by mouth at bedtime.   furosemide 40 MG tablet Commonly known as:  LASIX Take 80 mg by mouth 2 (two) times daily.   hydrOXYzine 10 MG tablet Commonly known as:  ATARAX/VISTARIL Take 10 mg by mouth at bedtime as needed for itching.   levofloxacin 250 MG tablet Commonly known as:  LEVAQUIN Take 1 tablet (250 mg total) by mouth daily.   levothyroxine 112 MCG tablet Commonly known as:  SYNTHROID, LEVOTHROID Take 112 mcg by mouth daily before breakfast.   loratadine 10 MG dissolvable tablet Commonly known as:  CLARITIN REDITABS Take 10 mg by mouth daily as needed for allergies.   ramipril 10 MG capsule Commonly known as:  ALTACE Take 10 mg by mouth at bedtime.   repaglinide 0.5 MG tablet Commonly known as:  PRANDIN Take 0.5 mg by mouth daily as needed (large carb meal).   traMADol 50 MG tablet Commonly known as:  ULTRAM Take 1 tablet (50 mg total) by mouth every 6 (six) hours as needed.        Signed: [name] 02/11/2018, 12:39 PM

## 2018-02-11 NOTE — Progress Notes (Signed)
Discharge instructions discussed with patient and family, verbalized agreement and understanding 

## 2018-02-11 NOTE — Anesthesia Postprocedure Evaluation (Signed)
Anesthesia Post Note  Patient: Cassandra Baker  Procedure(s) Performed: XI ROBOTIC ASSISTED TOTAL HYSTERECTOMY WITH BILATERAL SALPINGO OOPHORECTOMY (Bilateral ) SENTINEL LYMPH NODE BIOPSY (N/A )     Patient location during evaluation: PACU Anesthesia Type: General Level of consciousness: awake and alert Pain management: pain level controlled Vital Signs Assessment: post-procedure vital signs reviewed and stable Respiratory status: spontaneous breathing, nonlabored ventilation, respiratory function stable and patient connected to nasal cannula oxygen Cardiovascular status: blood pressure returned to baseline and stable Postop Assessment: no apparent nausea or vomiting Anesthetic complications: no    Last Vitals:  Vitals:   02/10/18 2118 02/11/18 0527  BP: (!) 126/52 130/67  Pulse: 83 83  Resp: 18 18  Temp: 37 C 36.6 C  SpO2: 96% 97%    Last Pain:  Vitals:   02/11/18 0527  TempSrc: Oral  PainSc:                  Montez Hageman

## 2018-02-12 ENCOUNTER — Other Ambulatory Visit: Payer: Medicare Other

## 2018-02-15 ENCOUNTER — Telehealth: Payer: Self-pay

## 2018-02-15 NOTE — Telephone Encounter (Signed)
Told Cassandra Cassandra Baker that the cancer has low risk features .  Dr. Denman George is recommending close follow up and no further therapy. Keep lab appointment on 02-16-18 for follow up kidney  function as scheduled. Cassandra Baker states that she is urinating very well.   Post Op appointment is 02-24-18. Pt verbalized understanding.

## 2018-02-16 ENCOUNTER — Other Ambulatory Visit: Payer: Medicare Other

## 2018-02-17 ENCOUNTER — Telehealth: Payer: Self-pay

## 2018-02-17 NOTE — Telephone Encounter (Signed)
Attempted to call Ms Cassandra Baker to r/s her missed lab appointment to recheck her serum creatine. VM full on phone.

## 2018-02-18 ENCOUNTER — Inpatient Hospital Stay: Payer: Medicare Other

## 2018-02-18 DIAGNOSIS — R7989 Other specified abnormal findings of blood chemistry: Secondary | ICD-10-CM

## 2018-02-18 DIAGNOSIS — I509 Heart failure, unspecified: Secondary | ICD-10-CM | POA: Diagnosis not present

## 2018-02-18 DIAGNOSIS — E118 Type 2 diabetes mellitus with unspecified complications: Secondary | ICD-10-CM | POA: Diagnosis not present

## 2018-02-18 DIAGNOSIS — E039 Hypothyroidism, unspecified: Secondary | ICD-10-CM | POA: Diagnosis not present

## 2018-02-18 DIAGNOSIS — I129 Hypertensive chronic kidney disease with stage 1 through stage 4 chronic kidney disease, or unspecified chronic kidney disease: Secondary | ICD-10-CM | POA: Diagnosis not present

## 2018-02-18 DIAGNOSIS — C541 Malignant neoplasm of endometrium: Secondary | ICD-10-CM | POA: Diagnosis not present

## 2018-02-18 DIAGNOSIS — N189 Chronic kidney disease, unspecified: Secondary | ICD-10-CM | POA: Diagnosis not present

## 2018-02-18 LAB — COMPREHENSIVE METABOLIC PANEL
ALT: 11 U/L (ref 0–55)
AST: 11 U/L (ref 5–34)
Albumin: 3.3 g/dL — ABNORMAL LOW (ref 3.5–5.0)
Alkaline Phosphatase: 135 U/L (ref 40–150)
Anion gap: 10 (ref 3–11)
BILIRUBIN TOTAL: 0.3 mg/dL (ref 0.2–1.2)
BUN: 59 mg/dL — AB (ref 7–26)
CHLORIDE: 105 mmol/L (ref 98–109)
CO2: 21 mmol/L — ABNORMAL LOW (ref 22–29)
CREATININE: 2.29 mg/dL — AB (ref 0.60–1.10)
Calcium: 9.1 mg/dL (ref 8.4–10.4)
GFR calc Af Amer: 23 mL/min — ABNORMAL LOW (ref >60)
GFR, EST NON AFRICAN AMERICAN: 19 mL/min — AB (ref >60)
GLUCOSE: 135 mg/dL (ref 70–140)
POTASSIUM: 4.3 mmol/L (ref 3.5–5.1)
Sodium: 136 mmol/L (ref 136–145)
Total Protein: 7.1 g/dL (ref 6.4–8.3)

## 2018-02-18 NOTE — Telephone Encounter (Signed)
Pt coming in today for repeat lab work at 1415.

## 2018-02-18 NOTE — Telephone Encounter (Signed)
LM for Cassandra Baker stating that her Creatine level is better then it was a week ago.  Keep pushing fluids and taking lasix as prescribed per Joylene John, NP.

## 2018-02-24 ENCOUNTER — Encounter: Payer: Self-pay | Admitting: Gynecologic Oncology

## 2018-02-24 ENCOUNTER — Inpatient Hospital Stay: Payer: Medicare Other | Attending: Gynecologic Oncology | Admitting: Gynecologic Oncology

## 2018-02-24 VITALS — BP 153/60 | HR 82 | Temp 98.3°F | Resp 20 | Wt 176.0 lb

## 2018-02-24 DIAGNOSIS — C541 Malignant neoplasm of endometrium: Secondary | ICD-10-CM | POA: Insufficient documentation

## 2018-02-24 DIAGNOSIS — Z7189 Other specified counseling: Secondary | ICD-10-CM

## 2018-02-24 NOTE — Patient Instructions (Signed)
Please notify Dr Denman George at phone number [phone] if you notice vaginal bleeding, new pelvic or abdominal pains, bloating, feeling full easy, or a change in bladder or bowel function.   Your cancer was a stage I low risk cancer. 6 monthly checks are recommended.  Dr Denman George is recommending that you call her office at the above number in [place] to schedule an appointment in September.

## 2018-02-24 NOTE — Progress Notes (Signed)
Follow-up Note: Gyn-Onc  Consult was requested by Dr. Harolyn Rutherford for the evaluation of Cassandra Baker 77 y.o. female  CC:  Chief Complaint  Patient presents with  . Endometrial cancer [facility])    Assessment/Plan:  Cassandra Baker  is a 77 y.o.  year old with stage IA grade 1 endometrioid endometrial cancer.  Pathology revealed low risk factors for recurrence, therefore no adjuvant therapy is recommended according to NCCN guidelines.  I discussed risk for recurrence and typical symptoms encouraged her to notify us of these should they develop between visits.  I recommend she have follow-up every 6 months for 5 years in accordance with NCCN guidelines. Those visits should include symptom assessment, physical exam and pelvic examination. Pap smears are not indicated or recommended in the routine surveillance of endometrial cancer.  She will return to see me in September, 2019 and Dr Harolyn Rutherford in March, 2020.    HPI: Cassandra Baker is a 77 year old P1 who is seen in consultation at the request of Dr Harolyn Rutherford for grade 1 endometrial cancer.   The patient reports vaginal spotting for several years off and on.  In January 2019 she developed heavy menstrual-like bleeding.  She was seen by her gynecologist who performed a TVUS on 01/19/18 which showed a fibroid uterus measuring 9.2 x 4.9 x 5.4 cm.  The endometrium was thickened and measured 14 mm.  The left and right ovaries were not visualized.  There is no free fluid seen.  An endometrial biopsy was performed by Dr. Harolyn Rutherford on January 11, 2018 revealed FIGO grade 1 endometrioid adenocarcinoma.  The patient has a personal medical history of asthma, congestive heart failure for which she takes Lasix, chronic kidney disease, diabetes mellitus.  She takes aspirin occasionally for a sensation of head allergies.  She has a family history of a maternal aunt with cervical cancer.  She has had 1 prior vaginal delivery.  Her past abdominal surgeries include a  tubal ligation and appendectomy.  Interval Hx: On 02/09/18 she underwent robotic assisted total hysterectomy, BSO, SLN biopsy. Final pathology revealed a 4.5cm grade 1 endometrial tumor with inner half invasion (0.5 of 2.5cm), no LVSI, normal nodes, adnexa and cervix.  She was determined to have low risk cancer and was recommended close surveillance and no adjuvant therapy in accordance with NCCN guidelines.   Current Meds:  Outpatient Encounter Medications as of 02/24/2018  Medication Sig  . allopurinol (ZYLOPRIM) 100 MG tablet Take 200 mg by mouth every evening.   [name] Kitchen aspirin 325 MG EC tablet Take 325 mg by mouth daily.  . cholecalciferol (VITAMIN D) 1000 units tablet Take 1,000 Units by mouth daily.  . clobetasol cream (TEMOVATE) 2.42 % Apply 1 application topically daily as needed (itching).  [name] Kitchen FLUoxetine (PROZAC) 10 MG capsule Take 10 mg by mouth at bedtime.  . furosemide (LASIX) 40 MG tablet Take 80 mg by mouth 2 (two) times daily.  . hydrOXYzine (ATARAX/VISTARIL) 10 MG tablet Take 10 mg by mouth at bedtime as needed for itching.  . levothyroxine (SYNTHROID, LEVOTHROID) 112 MCG tablet Take 112 mcg by mouth daily before breakfast.  . loratadine (CLARITIN REDITABS) 10 MG dissolvable tablet Take 10 mg by mouth daily as needed for allergies.  . ramipril (ALTACE) 10 MG capsule Take 10 mg by mouth at bedtime.  . repaglinide (PRANDIN) 0.5 MG tablet Take 0.5 mg by mouth daily as needed (large carb meal).  . traMADol (ULTRAM) 50 MG tablet Take 1 tablet (50 mg  total) by mouth every 6 (six) hours as needed.  . [DISCONTINUED] levofloxacin (LEVAQUIN) 250 MG tablet Take 1 tablet (250 mg total) by mouth daily.   No facility-administered encounter medications on file as of 02/24/2018.     Allergy:  Allergies  Allergen Reactions  . Plum Pulp Anaphylaxis and Cough  . Citrus     Coughing, chest tightness   . Eggs Or Egg-Derived Products Itching    congestion  . Penicillins Rash    Has patient had a  PCN reaction causing immediate rash, facial/tongue/throat swelling, SOB or lightheadedness with hypotension: Yes Has patient had a PCN reaction causing severe rash involving mucus membranes or skin necrosis: No Has patient had a PCN reaction that required hospitalization: No Has patient had a PCN reaction occurring within the last 10 years: No If all of the above answers are "NO", then may proceed with Cephalosporin use.     Social Hx:   Social History   Socioeconomic History  . Marital status: Married    Spouse name: Not on file  . Number of children: Not on file  . Years of education: Not on file  . Highest education level: Not on file  Social Needs  . Financial resource strain: Not on file  . Food insecurity - worry: Not on file  . Food insecurity - inability: Not on file  . Transportation needs - medical: Not on file  . Transportation needs - non-medical: Not on file  Occupational History  . Not on file  Tobacco Use  . Smoking status: Never Smoker  . Smokeless tobacco: Never Used  Substance and Sexual Activity  . Alcohol use: No  . Drug use: No  . Sexual activity: Not Currently  Other Topics Concern  . Not on file  Social History Narrative  . Not on file    Past Surgical Hx:  Past Surgical History:  Procedure Laterality Date  . APPENDECTOMY     4 th grade  . EYE SURGERY  2014   Bilateral 6 mos. apart  cataracts  . LYMPH NODE BIOPSY N/A 02/09/2018   Procedure: SENTINEL LYMPH NODE BIOPSY;  Surgeon: Everitt Amber, MD;  Location: [facility];  Service: Gynecology;  Laterality: N/A;  . PARATHYROIDECTOMY N/A 12/07/2014   Procedure: PARATHYROIDECTOMY;  Surgeon: Armandina Gemma, MD;  Location: [facility];  Service: General;  Laterality: N/A;  . ROBOTIC ASSISTED TOTAL HYSTERECTOMY WITH BILATERAL SALPINGO OOPHERECTOMY Bilateral 02/09/2018   Procedure: XI ROBOTIC ASSISTED TOTAL HYSTERECTOMY WITH BILATERAL SALPINGO OOPHORECTOMY;  Surgeon: Everitt Amber, MD;  Location: [facility];  Service:  Gynecology;  Laterality: Bilateral;  . THYROID LOBECTOMY Left 12/07/2014   Procedure: LEFT THYROID LOBECTOMY;  Surgeon: Armandina Gemma, MD;  Location: [facility];  Service: General;  Laterality: Left;  . TONSILLECTOMY    . TUBAL LIGATION     Early thirties    Past Medical Hx:  Past Medical History:  Diagnosis Date  . Arthritis    low back,knees  . Asthma   . 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 CHF (congestive heart failure) (HCC)    Has shortness of breath if she does not take Lasix  . Chronic kidney disease    Stated it is 29% functioning  . Diabetes mellitus without complication ([place])    Not on medication  . Dyspnea   . Hypertension   . Hypothyroidism     Past Gynecological History:   No LMP recorded. Patient is  postmenopausal.  Family Hx:  Family History  Problem Relation Age of Onset  . Hypertension Mother   . Heart disease Mother   . Hypertension Father   . Heart disease Father   . Cervical cancer Maternal Aunt     Review of Systems:  Constitutional  Feels well,    ENT Normal appearing ears and nares bilaterally Skin/Breast  No rash, sores, jaundice, itching, dryness Cardiovascular  No chest pain, shortness of breath, or edema  Pulmonary  No cough or wheeze.  Gastro Intestinal  No nausea, vomitting, or diarrhoea. No bright red blood per rectum, no abdominal pain, change in bowel movement, or constipation.  Genito Urinary  No frequency, urgency, dysuria, + postmenopausal bleeding. Musculo Skeletal  No myalgia, arthralgia, joint swelling or pain  Neurologic  No weakness, numbness, change in gait,  Psychology  No depression, anxiety, insomnia.   Vitals:  Blood pressure (!) 153/60, pulse 82, temperature 98.3 F (36.8 C), temperature source Oral, resp. rate 20, weight 176 lb (79.8 kg), SpO2 98 %.  Physical Exam: WD in NAD Neck  Supple NROM, without any enlargements.  Lymph Node Survey No cervical supraclavicular  or inguinal adenopathy Cardiovascular  Pulse normal rate, regularity and rhythm. S1 and S2 normal.  Lungs  Clear to auscultation bilateraly, without wheezes/crackles/rhonchi. Good air movement.  Skin  No rash/lesions/breakdown  Psychiatry  Alert and oriented to person, place, and time  Abdomen  Normoactive bowel sounds, abdomen soft, non-tender and obese without evidence of hernia. Well healed incisions. Back No CVA tenderness Genito Urinary  Vulva/vagina: Normal external female genitalia.   No lesions. No discharge or bleeding.  Bladder/urethra:  No lesions or masses, well supported bladder  Vagina: normal, well healed cuff (in tact). Rectal   deferred Extremities  No bilateral cyanosis, clubbing or edema.   20 minutes of direct face to face counseling time was spent with the patient. This included discussion about prognosis, therapy recommendations and postoperative side effects and are beyond the scope of routine postoperative care.   [name], MD  02/24/2018, 3:25 PM

## 2018-03-11 ENCOUNTER — Telehealth: Payer: Self-pay

## 2018-03-11 DIAGNOSIS — R1030 Lower abdominal pain, unspecified: Secondary | ICD-10-CM | POA: Diagnosis not present

## 2018-03-11 DIAGNOSIS — R1084 Generalized abdominal pain: Secondary | ICD-10-CM | POA: Diagnosis not present

## 2018-03-11 NOTE — Telephone Encounter (Signed)
Called Cassandra Baker.  She has been having urinary burning and abdominal pain for ~1 week.  She took 5 days of Cipro she had on hand for Urine. Symptoms not resolved and pt has taken mag citrate for her bowels- 2-3 shot glasses full in the last couple of days.  Abdominal pain/Gas.   [facility] is actually at [facility]  To be seen.  Pt will call back to this office if any of her symptoms are found to be related to her surgery on 02-09-18.

## 2018-03-12 ENCOUNTER — Ambulatory Visit: Payer: Medicare Other | Admitting: Gynecologic Oncology

## 2018-03-30 DIAGNOSIS — N183 Chronic kidney disease, stage 3 (moderate): Secondary | ICD-10-CM | POA: Diagnosis not present

## 2018-03-30 DIAGNOSIS — R809 Proteinuria, unspecified: Secondary | ICD-10-CM | POA: Diagnosis not present

## 2018-03-30 DIAGNOSIS — E669 Obesity, unspecified: Secondary | ICD-10-CM | POA: Diagnosis not present

## 2018-03-30 DIAGNOSIS — I129 Hypertensive chronic kidney disease with stage 1 through stage 4 chronic kidney disease, or unspecified chronic kidney disease: Secondary | ICD-10-CM | POA: Diagnosis not present

## 2018-03-30 DIAGNOSIS — I10 Essential (primary) hypertension: Secondary | ICD-10-CM | POA: Diagnosis not present

## 2018-03-30 DIAGNOSIS — E039 Hypothyroidism, unspecified: Secondary | ICD-10-CM | POA: Diagnosis not present

## 2018-03-30 DIAGNOSIS — E7800 Pure hypercholesterolemia, unspecified: Secondary | ICD-10-CM | POA: Diagnosis not present

## 2018-03-30 DIAGNOSIS — C541 Malignant neoplasm of endometrium: Secondary | ICD-10-CM | POA: Diagnosis not present

## 2018-03-30 DIAGNOSIS — E210 Primary hyperparathyroidism: Secondary | ICD-10-CM | POA: Diagnosis not present

## 2018-03-30 DIAGNOSIS — N2581 Secondary hyperparathyroidism of renal origin: Secondary | ICD-10-CM | POA: Diagnosis not present

## 2018-03-30 DIAGNOSIS — N189 Chronic kidney disease, unspecified: Secondary | ICD-10-CM | POA: Diagnosis not present

## 2018-03-30 DIAGNOSIS — R1084 Generalized abdominal pain: Secondary | ICD-10-CM | POA: Diagnosis not present

## 2018-03-30 DIAGNOSIS — M109 Gout, unspecified: Secondary | ICD-10-CM | POA: Diagnosis not present

## 2018-03-30 DIAGNOSIS — D631 Anemia in chronic kidney disease: Secondary | ICD-10-CM | POA: Diagnosis not present

## 2018-03-30 DIAGNOSIS — E1122 Type 2 diabetes mellitus with diabetic chronic kidney disease: Secondary | ICD-10-CM | POA: Diagnosis not present

## 2018-03-31 ENCOUNTER — Telehealth: Payer: Self-pay | Admitting: *Deleted

## 2018-03-31 NOTE — Telephone Encounter (Signed)
Faxed ROI to [facility]; release [phone]

## 2018-09-30 DIAGNOSIS — M8588 Other specified disorders of bone density and structure, other site: Secondary | ICD-10-CM | POA: Diagnosis not present

## 2018-09-30 DIAGNOSIS — E7800 Pure hypercholesterolemia, unspecified: Secondary | ICD-10-CM | POA: Diagnosis not present

## 2018-09-30 DIAGNOSIS — Z0000 Encounter for general adult medical examination without abnormal findings: Secondary | ICD-10-CM | POA: Diagnosis not present

## 2018-09-30 DIAGNOSIS — E782 Mixed hyperlipidemia: Secondary | ICD-10-CM | POA: Diagnosis not present

## 2018-09-30 DIAGNOSIS — F3341 Major depressive disorder, recurrent, in partial remission: Secondary | ICD-10-CM | POA: Diagnosis not present

## 2018-09-30 DIAGNOSIS — E210 Primary hyperparathyroidism: Secondary | ICD-10-CM | POA: Diagnosis not present

## 2018-09-30 DIAGNOSIS — N183 Chronic kidney disease, stage 3 (moderate): Secondary | ICD-10-CM | POA: Diagnosis not present

## 2018-09-30 DIAGNOSIS — E039 Hypothyroidism, unspecified: Secondary | ICD-10-CM | POA: Diagnosis not present

## 2018-09-30 DIAGNOSIS — I10 Essential (primary) hypertension: Secondary | ICD-10-CM | POA: Diagnosis not present

## 2018-09-30 DIAGNOSIS — Z23 Encounter for immunization: Secondary | ICD-10-CM | POA: Diagnosis not present

## 2018-09-30 DIAGNOSIS — E1169 Type 2 diabetes mellitus with other specified complication: Secondary | ICD-10-CM | POA: Diagnosis not present

## 2018-09-30 DIAGNOSIS — Z1389 Encounter for screening for other disorder: Secondary | ICD-10-CM | POA: Diagnosis not present

## 2018-10-20 DIAGNOSIS — E039 Hypothyroidism, unspecified: Secondary | ICD-10-CM | POA: Diagnosis not present

## 2018-11-08 DIAGNOSIS — E039 Hypothyroidism, unspecified: Secondary | ICD-10-CM | POA: Diagnosis not present

## 2018-11-08 DIAGNOSIS — E210 Primary hyperparathyroidism: Secondary | ICD-10-CM | POA: Diagnosis not present

## 2018-11-08 DIAGNOSIS — I129 Hypertensive chronic kidney disease with stage 1 through stage 4 chronic kidney disease, or unspecified chronic kidney disease: Secondary | ICD-10-CM | POA: Diagnosis not present

## 2018-11-08 DIAGNOSIS — E1122 Type 2 diabetes mellitus with diabetic chronic kidney disease: Secondary | ICD-10-CM | POA: Diagnosis not present

## 2018-11-08 DIAGNOSIS — E7800 Pure hypercholesterolemia, unspecified: Secondary | ICD-10-CM | POA: Diagnosis not present

## 2018-11-08 DIAGNOSIS — N189 Chronic kidney disease, unspecified: Secondary | ICD-10-CM | POA: Diagnosis not present

## 2018-11-08 DIAGNOSIS — I5189 Other ill-defined heart diseases: Secondary | ICD-10-CM | POA: Diagnosis not present

## 2018-11-08 DIAGNOSIS — N183 Chronic kidney disease, stage 3 (moderate): Secondary | ICD-10-CM | POA: Diagnosis not present

## 2018-11-08 DIAGNOSIS — M109 Gout, unspecified: Secondary | ICD-10-CM | POA: Diagnosis not present

## 2018-11-08 DIAGNOSIS — D631 Anemia in chronic kidney disease: Secondary | ICD-10-CM | POA: Diagnosis not present

## 2018-12-14 DIAGNOSIS — N2581 Secondary hyperparathyroidism of renal origin: Secondary | ICD-10-CM | POA: Diagnosis not present

## 2019-01-19 DIAGNOSIS — N183 Chronic kidney disease, stage 3 (moderate): Secondary | ICD-10-CM | POA: Diagnosis not present

## 2019-03-02 DIAGNOSIS — N2581 Secondary hyperparathyroidism of renal origin: Secondary | ICD-10-CM | POA: Diagnosis not present

## 2019-03-11 DIAGNOSIS — E119 Type 2 diabetes mellitus without complications: Secondary | ICD-10-CM | POA: Diagnosis not present

## 2019-03-15 DIAGNOSIS — I10 Essential (primary) hypertension: Secondary | ICD-10-CM | POA: Diagnosis not present

## 2019-03-15 DIAGNOSIS — E890 Postprocedural hypothyroidism: Secondary | ICD-10-CM | POA: Diagnosis not present

## 2019-03-15 DIAGNOSIS — N189 Chronic kidney disease, unspecified: Secondary | ICD-10-CM | POA: Diagnosis not present

## 2019-03-15 DIAGNOSIS — E1165 Type 2 diabetes mellitus with hyperglycemia: Secondary | ICD-10-CM | POA: Diagnosis not present

## 2019-03-16 ENCOUNTER — Telehealth: Payer: Self-pay | Admitting: *Deleted

## 2019-03-16 NOTE — Telephone Encounter (Signed)
Medical records faxed to [facility] [place], [place] [phone]

## 2019-03-31 DIAGNOSIS — E1169 Type 2 diabetes mellitus with other specified complication: Secondary | ICD-10-CM | POA: Diagnosis not present

## 2019-03-31 DIAGNOSIS — E210 Primary hyperparathyroidism: Secondary | ICD-10-CM | POA: Diagnosis not present

## 2019-03-31 DIAGNOSIS — F3341 Major depressive disorder, recurrent, in partial remission: Secondary | ICD-10-CM | POA: Diagnosis not present

## 2019-03-31 DIAGNOSIS — E7800 Pure hypercholesterolemia, unspecified: Secondary | ICD-10-CM | POA: Diagnosis not present

## 2019-03-31 DIAGNOSIS — E039 Hypothyroidism, unspecified: Secondary | ICD-10-CM | POA: Diagnosis not present

## 2019-03-31 DIAGNOSIS — I10 Essential (primary) hypertension: Secondary | ICD-10-CM | POA: Diagnosis not present

## 2019-04-20 DIAGNOSIS — M109 Gout, unspecified: Secondary | ICD-10-CM | POA: Diagnosis not present

## 2019-04-20 DIAGNOSIS — N183 Chronic kidney disease, stage 3 (moderate): Secondary | ICD-10-CM | POA: Diagnosis not present

## 2019-04-20 DIAGNOSIS — E1122 Type 2 diabetes mellitus with diabetic chronic kidney disease: Secondary | ICD-10-CM | POA: Diagnosis not present

## 2019-04-20 DIAGNOSIS — I129 Hypertensive chronic kidney disease with stage 1 through stage 4 chronic kidney disease, or unspecified chronic kidney disease: Secondary | ICD-10-CM | POA: Diagnosis not present

## 2019-04-20 DIAGNOSIS — N189 Chronic kidney disease, unspecified: Secondary | ICD-10-CM | POA: Diagnosis not present

## 2019-04-20 DIAGNOSIS — E039 Hypothyroidism, unspecified: Secondary | ICD-10-CM | POA: Diagnosis not present

## 2019-04-20 DIAGNOSIS — E7800 Pure hypercholesterolemia, unspecified: Secondary | ICD-10-CM | POA: Diagnosis not present

## 2019-04-20 DIAGNOSIS — E210 Primary hyperparathyroidism: Secondary | ICD-10-CM | POA: Diagnosis not present

## 2019-04-20 DIAGNOSIS — I5189 Other ill-defined heart diseases: Secondary | ICD-10-CM | POA: Diagnosis not present

## 2019-04-20 DIAGNOSIS — D631 Anemia in chronic kidney disease: Secondary | ICD-10-CM | POA: Diagnosis not present

## 2019-04-22 ENCOUNTER — Other Ambulatory Visit: Payer: Self-pay

## 2019-04-22 ENCOUNTER — Telehealth (HOSPITAL_COMMUNITY): Payer: Self-pay | Admitting: Rehabilitation

## 2019-04-22 DIAGNOSIS — N183 Chronic kidney disease, stage 3 unspecified: Secondary | ICD-10-CM

## 2019-04-22 NOTE — Telephone Encounter (Signed)
The above patient or their representative was contacted and gave the following answers to these questions:         Do you have any of the following symptoms? No  Fever                    Cough                   Shortness of breath  Do  you have any of the following other symptoms? No   muscle pain         vomiting,        diarrhea        rash         weakness        red eye        abdominal pain         bruising          bruising or bleeding              joint pain           severe headache    Have you been in contact with someone who was or has been sick in the past 2 weeks? No  Yes                 Unsure                         Unable to assess   Does the person that you were in contact with have any of the following symptoms?   Cough         shortness of breath           muscle pain         vomiting,            diarrhea            rash            weakness           fever            red eye           abdominal pain           bruising  or  bleeding                joint pain                severe headache               Have you  or someone you have been in contact with traveled internationally in th last month? No        If yes, which countries?   Have you  or someone you have been in contact with traveled outside [place] in th last month? No         If yes, which state and city?   COMMENTS OR ACTION PLAN FOR THIS PATIENT:  Pt will wear mask to appt

## 2019-04-25 ENCOUNTER — Other Ambulatory Visit: Payer: Self-pay | Admitting: *Deleted

## 2019-04-25 ENCOUNTER — Encounter: Payer: Self-pay | Admitting: *Deleted

## 2019-04-25 ENCOUNTER — Encounter: Payer: Self-pay | Admitting: Surgery

## 2019-04-25 ENCOUNTER — Ambulatory Visit (INDEPENDENT_AMBULATORY_CARE_PROVIDER_SITE_OTHER)
Admission: RE | Admit: 2019-04-25 | Discharge: 2019-04-25 | Disposition: A | Discharge status: Home / Self Care | Payer: Medicare Other | Source: Ambulatory Visit | Attending: Family | Admitting: Family

## 2019-04-25 ENCOUNTER — Ambulatory Visit (INDEPENDENT_AMBULATORY_CARE_PROVIDER_SITE_OTHER): Payer: Medicare Other | Admitting: Surgery

## 2019-04-25 ENCOUNTER — Other Ambulatory Visit: Payer: Self-pay

## 2019-04-25 ENCOUNTER — Ambulatory Visit (HOSPITAL_COMMUNITY)
Admission: RE | Admit: 2019-04-25 | Discharge: 2019-04-25 | Disposition: A | Discharge status: Home / Self Care | Payer: Medicare Other | Source: Ambulatory Visit | Attending: Family | Admitting: Family

## 2019-04-25 VITALS — BP 108/62 | HR 74 | Temp 97.4°F | Resp 16 | Ht 64.0 in | Wt 169.0 lb

## 2019-04-25 DIAGNOSIS — N183 Chronic kidney disease, stage 3 unspecified: Secondary | ICD-10-CM

## 2019-04-25 DIAGNOSIS — N184 Chronic kidney disease, stage 4 (severe): Secondary | ICD-10-CM | POA: Diagnosis not present

## 2019-04-25 NOTE — Progress Notes (Signed)
[facility]  Patient name: BERLYNN WARSAME MRN: [id number] DOB: 03-22-1941 Sex: female   REQUESTING PROVIDER:    Dr. Justin Mend   REASON FOR CONSULT:    Dialysis access  HISTORY OF PRESENT ILLNESS:   RAIHANA BALDERRAMA is a 78 y.o. female, who is referred today for dialysis access.  Her renal failure secondary to diabetes and hypertension.  She is right-handed.  Her GFR is less than 20.  Her renal failure is complicated by hyperparathyroidism.  She has hypercholesterolemia but does not take a statin.  PAST MEDICAL HISTORY    Past Medical History:  Diagnosis Date  . Arthritis    low back,knees  . Asthma   . 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 CHF (congestive heart failure) (HCC)    Has shortness of breath if she does not take Lasix  . Chronic kidney disease    Stated it is 29% functioning  . Diabetes mellitus without complication ([place])    Not on medication  . Dyspnea   . Hypertension   . Hypothyroidism      FAMILY HISTORY   Family History  Problem Relation Age of Onset  . Hypertension Mother   . Heart disease Mother   . Hypertension Father   . Heart disease Father   . Cervical cancer Maternal Aunt     SOCIAL HISTORY:   Social History   Socioeconomic History  . Marital status: Married    Spouse name: Not on file  . Number of children: Not on file  . Years of education: Not on file  . Highest education level: Not on file  Occupational History  . Not on file  Social Needs  . Financial resource strain: Not on file  . Food insecurity:    Worry: Not on file    Inability: Not on file  . Transportation needs:    Medical: Not on file    Non-medical: Not on file  Tobacco Use  . Smoking status: Never Smoker  . Smokeless tobacco: Never Used  Substance and Sexual Activity  . Alcohol use: No  . Drug use: No  . Sexual activity: Not Currently  Lifestyle  .  Physical activity:    Days per week: Not on file    Minutes per session: Not on file  . Stress: Not on file  Relationships  . Social connections:    Talks on phone: Not on file    Gets together: Not on file    Attends religious service: Not on file    Active member of club or organization: Not on file    Attends meetings of clubs or organizations: Not on file    Relationship status: Not on file  . Intimate partner violence:    Fear of current or ex partner: Not on file    Emotionally abused: Not on file    Physically abused: Not on file    Forced sexual activity: Not on file  Other Topics Concern  . Not on file  Social History Narrative  . Not on file    ALLERGIES:    Allergies  Allergen Reactions  . Plum Pulp Anaphylaxis and Cough  . Citrus     Coughing, chest tightness   . Eggs Or Egg-Derived Products Itching    congestion  . Penicillins Rash    Has patient had a PCN reaction causing immediate rash, facial/tongue/throat swelling, SOB or lightheadedness  with hypotension: Yes Has patient had a PCN reaction causing severe rash involving mucus membranes or skin necrosis: No Has patient had a PCN reaction that required hospitalization: No Has patient had a PCN reaction occurring within the last 10 years: No If all of the above answers are "NO", then may proceed with Cephalosporin use.     CURRENT MEDICATIONS:    Current Outpatient Medications  Medication Sig Dispense Refill  . allopurinol (ZYLOPRIM) 100 MG tablet Take 200 mg by mouth every evening.     [name] Kitchen aspirin 325 MG EC tablet Take 325 mg by mouth daily.    . cholecalciferol (VITAMIN D) 1000 units tablet Take 1,000 Units by mouth daily.    . clobetasol cream (TEMOVATE) 0.62 % Apply 1 application topically daily as needed (itching).    [name] Kitchen FLUoxetine (PROZAC) 10 MG capsule Take 10 mg by mouth at bedtime.    . furosemide (LASIX) 40 MG tablet Take 80 mg by mouth 2 (two) times daily.    . hydrOXYzine (ATARAX/VISTARIL) 10  MG tablet Take 10 mg by mouth at bedtime as needed for itching.    . levothyroxine (SYNTHROID, LEVOTHROID) 112 MCG tablet Take 112 mcg by mouth daily before breakfast.    . ramipril (ALTACE) 10 MG capsule Take 10 mg by mouth at bedtime.    . sitaGLIPtin (JANUVIA) 25 MG tablet Take 25 mg by mouth daily.    . traMADol (ULTRAM) 50 MG tablet Take 1 tablet (50 mg total) by mouth every 6 (six) hours as needed. 30 tablet 0  . loratadine (CLARITIN REDITABS) 10 MG dissolvable tablet Take 10 mg by mouth daily as needed for allergies.    . repaglinide (PRANDIN) 0.5 MG tablet Take 0.5 mg by mouth daily as needed (large carb meal).     No current facility-administered medications for this visit.     REVIEW OF SYSTEMS:   [X]  denotes positive finding, [ ]  denotes negative finding Cardiac  Comments:  Chest pain or chest pressure:    Shortness of breath upon exertion: x   Short of breath when lying flat: x   Irregular heart rhythm:        Vascular    Pain in calf, thigh, or hip brought on by ambulation:    Pain in feet at night that wakes you up from your sleep:     Blood clot in your veins:    Leg swelling:         Pulmonary    Oxygen at home:    Productive cough:     Wheezing:         Neurologic    Sudden weakness in arms or legs:     Sudden numbness in arms or legs:     Sudden onset of difficulty speaking or slurred speech:    Temporary loss of vision in one eye:     Problems with dizziness:         Gastrointestinal    Blood in stool:      Vomited blood:         Genitourinary    Burning when urinating:     Blood in urine:        Psychiatric    Major depression:         Hematologic    Bleeding problems:    Problems with blood clotting too easily:        Skin    Rashes or ulcers:        Constitutional  Fever or chills:     PHYSICAL EXAM:   Vitals:   04/25/19 1130  BP: 108/62  Pulse: 74  Resp: 16  Temp: (!) 97.4 F (36.3 C)  TempSrc: Oral  SpO2: 97%  Weight:  76.7 kg  Height: 5\' 4"  (1.626 m)    GENERAL: The patient is a well-nourished female, in no acute distress. The vital signs are documented above. CARDIAC: There is a regular rate and rhythm.  VASCULAR: Palpable left brachial and radial pulse PULMONARY: Nonlabored respirations MUSCULOSKELETAL: There are no major deformities or cyanosis. NEUROLOGIC: No focal weakness or paresthesias are detected. SKIN: There are no ulcers or rashes noted. PSYCHIATRIC: The patient has a normal affect.  STUDIES:   I have ordered and reviewed the following:  Arterial:   Right: No obstruction visualized in the right upper extremity. Left: No obstruction visualized in the left upper extremity. +-----------------+-------------+----------+--------+ Right Cephalic   Diameter (cm)Depth (cm)Findings +-----------------+-------------+----------+--------+ Shoulder             0.31                        +-----------------+-------------+----------+--------+ Prox upper arm       0.28               tortuous +-----------------+-------------+----------+--------+ Mid upper arm        0.38               tortuous +-----------------+-------------+----------+--------+ Dist upper arm       0.25               tortuous +-----------------+-------------+----------+--------+ Antecubital fossa    0.34                        +-----------------+-------------+----------+--------+ Prox forearm         0.38                        +-----------------+-------------+----------+--------+ Mid forearm          0.43                        +-----------------+-------------+----------+--------+ Dist forearm         0.38                        +-----------------+-------------+----------+--------+ Wrist                0.31                        +-----------------+-------------+----------+--------+  +-----------------+-------------+----------+--------+ Right Basilic    Diameter (cm)Depth  (cm)Findings +-----------------+-------------+----------+--------+ Mid upper arm        0.64                        +-----------------+-------------+----------+--------+ Dist upper arm       0.55                        +-----------------+-------------+----------+--------+ Antecubital fossa    0.56                        +-----------------+-------------+----------+--------+ Prox forearm         0.42                        +-----------------+-------------+----------+--------+  +-----------------+-------------+----------+---------+  Left Cephalic    Diameter (cm)Depth (cm)Findings  +-----------------+-------------+----------+---------+ Shoulder             0.33                         +-----------------+-------------+----------+---------+ Prox upper arm       0.39                         +-----------------+-------------+----------+---------+ Mid upper arm        0.46                         +-----------------+-------------+----------+---------+ Dist upper arm       0.38               branching +-----------------+-------------+----------+---------+ Antecubital fossa    0.49                         +-----------------+-------------+----------+---------+ Prox forearm         0.26                         +-----------------+-------------+----------+---------+ Mid forearm          0.26                         +-----------------+-------------+----------+---------+ Dist forearm         0.24                         +-----------------+-------------+----------+---------+ Wrist                0.26               branching +-----------------+-------------+----------+---------+  +-----------------+-------------+----------+--------+ Left Basilic     Diameter (cm)Depth (cm)Findings +-----------------+-------------+----------+--------+ Mid upper arm        0.52                         +-----------------+-------------+----------+--------+ Dist upper arm       0.55                        +-----------------+-------------+----------+--------+ Antecubital fossa    0.48                        +-----------------+-------------+----------+--------+  ASSESSMENT and PLAN   Chronic renal insufficiency: I discussed with the patient would be a candidate for left arm fistula.  This will most likely be a left brachiocephalic fistula creation.  I discussed the risk and benefits of the procedure including the risk of non-maturity and the risk of steal syndrome.  We also discussed the possibility of future interventions.  She will be scheduled in the near future.   Leia Alf, MD, FACS [facility] [phone] Pager [phone]

## 2019-04-25 NOTE — H&P (View-Only) (Signed)
[facility] and [facility]in Specialist of [facility]  Patient name: Cassandra Baker MRN: [id number] DOB: 1941-02-08 Sex: female   REQUESTING PROVIDER:    Dr. Justin Mend   REASON FOR CONSULT:    Dialysis access  HISTORY OF PRESENT ILLNESS:   Cassandra Baker is a 78 y.o. female, who is referred today for dialysis access.  Her renal failure secondary to diabetes and hypertension.  She is right-handed.  Her GFR is less than 20.  Her renal failure is complicated by hyperparathyroidism.  She has hypercholesterolemia but does not take a statin.  PAST MEDICAL HISTORY    Past Medical History:  Diagnosis Date  . Arthritis    low back,knees  . Asthma   . 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 CHF (congestive heart failure) (H[facility])    Has shortness of breath if she does not take Lasix  . Chronic kidney disease    Stated it is 29% functioning  . Diabetes mellitus without complication ([place])    Not on medication  . Dyspnea   . Hypertension   . Hypothyroidism      FAMILY HISTORY   Family History  Problem Relation Age of Onset  . Hypertension Mother   . Heart disease Mother   . Hypertension Father   . Heart disease Father   . Cervical cancer Maternal Aunt     SOCIAL HISTORY:   Social History   Socioeconomic History  . Marital status: Married    Spouse name: Not on file  . Number of children: Not on file  . Years of education: Not on file  . Highest education level: Not on file  Occupational History  . Not on file  Social Needs  . Financial resource strain: Not on file  . Food insecurity:    Worry: Not on file    Inability: Not on file  . Transportation needs:    Medical: Not on file    Non-medical: Not on file  Tobacco Use  . Smoking status: Never Smoker  . Smokeless tobacco: Never Used  Substance and Sexual Activity  . Alcohol use: No  . Drug use: No  . Sexual activity: Not Currently  Lifestyle  .  Physical activity:    Days per week: Not on file    Minutes per session: Not on file  . Stress: Not on file  Relationships  . Social connections:    Talks on phone: Not on file    Gets together: Not on file    Attends religious service: Not on file    Active member of club or organization: Not on file    Attends meetings of clubs or organizations: Not on file    Relationship status: Not on file  . Intimate partner violence:    Fear of current or ex partner: Not on file    Emotionally abused: Not on file    Physically abused: Not on file    Forced sexual activity: Not on file  Other Topics Concern  . Not on file  Social History Narrative  . Not on file    ALLERGIES:    Allergies  Allergen Reactions  . Plum Pulp Anaphylaxis and Cough  . Citrus     Coughing, chest tightness   . Eggs Or Egg-Derived Products Itching    congestion  . Penicillins Rash    Has patient had a PCN reaction causing immediate rash, facial/tongue/throat swelling, SOB or lightheadedness  with hypotension: Yes Has patient had a PCN reaction causing severe rash involving mucus membranes or skin necrosis: No Has patient had a PCN reaction that required hospitalization: No Has patient had a PCN reaction occurring within the last 10 years: No If all of the above answers are "NO", then may proceed with Cephalosporin use.     CURRENT MEDICATIONS:    Current Outpatient Medications  Medication Sig Dispense Refill  . allopurinol (ZYLOPRIM) 100 MG tablet Take 200 mg by mouth every evening.     [name] Kitchen aspirin 325 MG EC tablet Take 325 mg by mouth daily.    . cholecalciferol (VITAMIN D) 1000 units tablet Take 1,000 Units by mouth daily.    . clobetasol cream (TEMOVATE) 7.49 % Apply 1 application topically daily as needed (itching).    [name] Kitchen FLUoxetine (PROZAC) 10 MG capsule Take 10 mg by mouth at bedtime.    . furosemide (LASIX) 40 MG tablet Take 80 mg by mouth 2 (two) times daily.    . hydrOXYzine (ATARAX/VISTARIL) 10  MG tablet Take 10 mg by mouth at bedtime as needed for itching.    . levothyroxine (SYNTHROID, LEVOTHROID) 112 MCG tablet Take 112 mcg by mouth daily before breakfast.    . ramipril (ALTACE) 10 MG capsule Take 10 mg by mouth at bedtime.    . sitaGLIPtin (JANUVIA) 25 MG tablet Take 25 mg by mouth daily.    . traMADol (ULTRAM) 50 MG tablet Take 1 tablet (50 mg total) by mouth every 6 (six) hours as needed. 30 tablet 0  . loratadine (CLARITIN REDITABS) 10 MG dissolvable tablet Take 10 mg by mouth daily as needed for allergies.    . repaglinide (PRANDIN) 0.5 MG tablet Take 0.5 mg by mouth daily as needed (large carb meal).     No current facility-administered medications for this visit.     REVIEW OF SYSTEMS:   [X]  denotes positive finding, [ ]  denotes negative finding Cardiac  Comments:  Chest pain or chest pressure:    Shortness of breath upon exertion: x   Short of breath when lying flat: x   Irregular heart rhythm:        Vascular    Pain in calf, thigh, or hip brought on by ambulation:    Pain in feet at night that wakes you up from your sleep:     Blood clot in your veins:    Leg swelling:         Pulmonary    Oxygen at home:    Productive cough:     Wheezing:         Neurologic    Sudden weakness in arms or legs:     Sudden numbness in arms or legs:     Sudden onset of difficulty speaking or slurred speech:    Temporary loss of vision in one eye:     Problems with dizziness:         Gastrointestinal    Blood in stool:      Vomited blood:         Genitourinary    Burning when urinating:     Blood in urine:        Psychiatric    Major depression:         Hematologic    Bleeding problems:    Problems with blood clotting too easily:        Skin    Rashes or ulcers:        Constitutional  Fever or chills:     PHYSICAL EXAM:   Vitals:   04/25/19 1130  BP: 108/62  Pulse: 74  Resp: 16  Temp: (!) 97.4 F (36.3 C)  TempSrc: Oral  SpO2: 97%  Weight:  76.7 kg  Height: 5\' 4"  (1.626 m)    GENERAL: The patient is a well-nourished female, in no acute distress. The vital signs are documented above. CARDIAC: There is a regular rate and rhythm.  VASCULAR: Palpable left brachial and radial pulse PULMONARY: Nonlabored respirations MUSCULOSKELETAL: There are no major deformities or cyanosis. NEUROLOGIC: No focal weakness or paresthesias are detected. SKIN: There are no ulcers or rashes noted. PSYCHIATRIC: The patient has a normal affect.  STUDIES:   I have ordered and reviewed the following:  Arterial:   Right: No obstruction visualized in the right upper extremity. Left: No obstruction visualized in the left upper extremity. +-----------------+-------------+----------+--------+ Right Cephalic   Diameter (cm)Depth (cm)Findings +-----------------+-------------+----------+--------+ Shoulder             0.31                        +-----------------+-------------+----------+--------+ Prox upper arm       0.28               tortuous +-----------------+-------------+----------+--------+ Mid upper arm        0.38               tortuous +-----------------+-------------+----------+--------+ Dist upper arm       0.25               tortuous +-----------------+-------------+----------+--------+ Antecubital fossa    0.34                        +-----------------+-------------+----------+--------+ Prox forearm         0.38                        +-----------------+-------------+----------+--------+ Mid forearm          0.43                        +-----------------+-------------+----------+--------+ Dist forearm         0.38                        +-----------------+-------------+----------+--------+ Wrist                0.31                        +-----------------+-------------+----------+--------+  +-----------------+-------------+----------+--------+ Right Basilic    Diameter (cm)Depth  (cm)Findings +-----------------+-------------+----------+--------+ Mid upper arm        0.64                        +-----------------+-------------+----------+--------+ Dist upper arm       0.55                        +-----------------+-------------+----------+--------+ Antecubital fossa    0.56                        +-----------------+-------------+----------+--------+ Prox forearm         0.42                        +-----------------+-------------+----------+--------+  +-----------------+-------------+----------+---------+  Left Cephalic    Diameter (cm)Depth (cm)Findings  +-----------------+-------------+----------+---------+ Shoulder             0.33                         +-----------------+-------------+----------+---------+ Prox upper arm       0.39                         +-----------------+-------------+----------+---------+ Mid upper arm        0.46                         +-----------------+-------------+----------+---------+ Dist upper arm       0.38               branching +-----------------+-------------+----------+---------+ Antecubital fossa    0.49                         +-----------------+-------------+----------+---------+ Prox forearm         0.26                         +-----------------+-------------+----------+---------+ Mid forearm          0.26                         +-----------------+-------------+----------+---------+ Dist forearm         0.24                         +-----------------+-------------+----------+---------+ Wrist                0.26               branching +-----------------+-------------+----------+---------+  +-----------------+-------------+----------+--------+ Left Basilic     Diameter (cm)Depth (cm)Findings +-----------------+-------------+----------+--------+ Mid upper arm        0.52                         +-----------------+-------------+----------+--------+ Dist upper arm       0.55                        +-----------------+-------------+----------+--------+ Antecubital fossa    0.48                        +-----------------+-------------+----------+--------+  ASSESSMENT and PLAN   Chronic renal insufficiency: I discussed with the patient would be a candidate for left arm fistula.  This will most likely be a left brachiocephalic fistula creation.  I discussed the risk and benefits of the procedure including the risk of non-maturity and the risk of steal syndrome.  We also discussed the possibility of future interventions.  She will be scheduled in the near future.   Leia Alf, MD, FACS [facility] [phone] Pager [phone]

## 2019-04-29 ENCOUNTER — Other Ambulatory Visit: Payer: Self-pay | Admitting: *Deleted

## 2019-05-10 ENCOUNTER — Encounter (HOSPITAL_COMMUNITY): Payer: Self-pay | Admitting: *Deleted

## 2019-05-10 ENCOUNTER — Other Ambulatory Visit: Payer: Self-pay

## 2019-05-10 NOTE — Progress Notes (Signed)
Pt denies SOB, chest pain, and being under the care of a cardiologist. Pt denies having a cardiac cath but stated that a stress test was performed > 10 years ago. Nurse requested echo from PCP, Dr. Seward Carol. Pt denies having an EKG and chest x ray within the last year.  Pt made aware to stop taking vitamins, fish oil and herbal medications. Do not take any NSAIDs ie: Ibuprofen, Advil, Naproxen (Aleve), Motrin, BC and Goody Powder. Pt made aware to take no Januvia on DOS. Pt stated that she does not check her blood glucose.  Pt denies that she and family members tested positive for COVID -19 ( COVID-19 test scheduled for 05/11/2019). Pt denies that she and family members experienced the following symptoms:  Cough yes/no: No Fever (>100.83F)  yes/no: No Runny nose yes/no: No Sore throat yes/no: No Difficulty breathing/shortness of breath  yes/no: No  Have you or a family member traveled in the last 14 days and where? yes/no: No  Pt reminded that hospital visitation restrictions are in effect and the importance of the restrictions.   Pt verbalized understanding of all pre-op instructions.

## 2019-05-11 ENCOUNTER — Other Ambulatory Visit (HOSPITAL_COMMUNITY)
Admission: RE | Admit: 2019-05-11 | Discharge: 2019-05-11 | Disposition: A | Discharge status: Home / Self Care | Payer: Medicare Other | Source: Ambulatory Visit | Attending: Surgery | Admitting: Surgery

## 2019-05-11 DIAGNOSIS — Z1159 Encounter for screening for other viral diseases: Secondary | ICD-10-CM | POA: Insufficient documentation

## 2019-05-11 LAB — SARS CORONAVIRUS 2 BY RT PCR (HOSPITAL ORDER, PERFORMED IN ~~LOC~~ HOSPITAL LAB): SARS Coronavirus 2: NEGATIVE

## 2019-05-12 ENCOUNTER — Ambulatory Visit (HOSPITAL_COMMUNITY): Payer: Medicare Other | Admitting: Anesthesiology

## 2019-05-12 ENCOUNTER — Encounter (HOSPITAL_COMMUNITY)
Admission: RE | Disposition: A | Discharge status: Home / Self Care | Payer: Self-pay | Source: Home / Self Care | Attending: Surgery

## 2019-05-12 ENCOUNTER — Other Ambulatory Visit: Payer: Self-pay

## 2019-05-12 ENCOUNTER — Ambulatory Visit (HOSPITAL_COMMUNITY)
Admission: RE | Admit: 2019-05-12 | Discharge: 2019-05-12 | Disposition: A | Discharge status: Home / Self Care | Payer: Medicare Other | Attending: Surgery | Admitting: Surgery

## 2019-05-12 ENCOUNTER — Encounter (HOSPITAL_COMMUNITY): Payer: Self-pay

## 2019-05-12 DIAGNOSIS — E039 Hypothyroidism, unspecified: Secondary | ICD-10-CM | POA: Diagnosis not present

## 2019-05-12 DIAGNOSIS — Z7984 Long term (current) use of oral hypoglycemic drugs: Secondary | ICD-10-CM | POA: Insufficient documentation

## 2019-05-12 DIAGNOSIS — I509 Heart failure, unspecified: Secondary | ICD-10-CM | POA: Insufficient documentation

## 2019-05-12 DIAGNOSIS — Z7982 Long term (current) use of aspirin: Secondary | ICD-10-CM | POA: Insufficient documentation

## 2019-05-12 DIAGNOSIS — I130 Hypertensive heart and chronic kidney disease with heart failure and stage 1 through stage 4 chronic kidney disease, or unspecified chronic kidney disease: Secondary | ICD-10-CM | POA: Insufficient documentation

## 2019-05-12 DIAGNOSIS — N186 End stage renal disease: Secondary | ICD-10-CM | POA: Diagnosis not present

## 2019-05-12 DIAGNOSIS — E213 Hyperparathyroidism, unspecified: Secondary | ICD-10-CM | POA: Insufficient documentation

## 2019-05-12 DIAGNOSIS — Z85828 Personal history of other malignant neoplasm of skin: Secondary | ICD-10-CM | POA: Insufficient documentation

## 2019-05-12 DIAGNOSIS — E7800 Pure hypercholesterolemia, unspecified: Secondary | ICD-10-CM | POA: Insufficient documentation

## 2019-05-12 DIAGNOSIS — F329 Major depressive disorder, single episode, unspecified: Secondary | ICD-10-CM | POA: Diagnosis not present

## 2019-05-12 DIAGNOSIS — I132 Hypertensive heart and chronic kidney disease with heart failure and with stage 5 chronic kidney disease, or end stage renal disease: Secondary | ICD-10-CM | POA: Diagnosis not present

## 2019-05-12 DIAGNOSIS — E1122 Type 2 diabetes mellitus with diabetic chronic kidney disease: Secondary | ICD-10-CM | POA: Insufficient documentation

## 2019-05-12 DIAGNOSIS — Z79899 Other long term (current) drug therapy: Secondary | ICD-10-CM | POA: Diagnosis not present

## 2019-05-12 DIAGNOSIS — N184 Chronic kidney disease, stage 4 (severe): Secondary | ICD-10-CM | POA: Insufficient documentation

## 2019-05-12 HISTORY — DX: Depression, unspecified: F32.A

## 2019-05-12 HISTORY — PX: AV FISTULA PLACEMENT: SHX1204

## 2019-05-12 LAB — GLUCOSE, CAPILLARY
Glucose-Capillary: 143 mg/dL — ABNORMAL HIGH (ref 70–99)
Glucose-Capillary: 109 mg/dL — ABNORMAL HIGH (ref 70–99)

## 2019-05-12 LAB — POCT I-STAT 4, (NA,K, GLUC, HGB,HCT)
Glucose, Bld: 134 mg/dL — ABNORMAL HIGH (ref 70–99)
HCT: 33 % — ABNORMAL LOW (ref 36.0–46.0)
Hemoglobin: 11.2 g/dL — ABNORMAL LOW (ref 12.0–15.0)
Potassium: 4.5 mmol/L (ref 3.5–5.1)
Sodium: 138 mmol/L (ref 135–145)

## 2019-05-12 SURGERY — ARTERIOVENOUS (AV) FISTULA CREATION
Anesthesia: Monitor Anesthesia Care | Site: Arm Upper | Laterality: Left

## 2019-05-12 MED ORDER — HEPARIN SODIUM (PORCINE) 1000 UNIT/ML IJ SOLN
INTRAMUSCULAR | Status: AC
  Filled 2019-05-12: qty 1

## 2019-05-12 MED ORDER — SODIUM CHLORIDE 0.9 % IV SOLN
INTRAVENOUS | Status: DC | PRN
  Administered 2019-05-12: 500 mL

## 2019-05-12 MED ORDER — SODIUM CHLORIDE 0.9 % IV SOLN
INTRAVENOUS | Status: AC
  Filled 2019-05-12: qty 1.2

## 2019-05-12 MED ORDER — VANCOMYCIN HCL IN DEXTROSE 1-5 GM/200ML-% IV SOLN
1000.0000 mg | INTRAVENOUS | Status: AC
  Administered 2019-05-12: 1000 mg via INTRAVENOUS

## 2019-05-12 MED ORDER — PROTAMINE SULFATE 10 MG/ML IV SOLN
INTRAVENOUS | Status: AC
  Filled 2019-05-12: qty 5

## 2019-05-12 MED ORDER — OXYCODONE HCL 5 MG PO TABS: 5.0000 mg | ORAL_TABLET | Freq: Once | ORAL | Status: DC | PRN

## 2019-05-12 MED ORDER — OXYCODONE HCL 5 MG/5ML PO SOLN: 5.0000 mg | Freq: Once | ORAL | Status: DC | PRN

## 2019-05-12 MED ORDER — LIDOCAINE-EPINEPHRINE (PF) 1 %-1:200000 IJ SOLN
INTRAMUSCULAR | Status: DC | PRN
  Administered 2019-05-12: 10 mL

## 2019-05-12 MED ORDER — DEXAMETHASONE SODIUM PHOSPHATE 10 MG/ML IJ SOLN
INTRAMUSCULAR | Status: DC | PRN
  Administered 2019-05-12: 6 mg via INTRAVENOUS

## 2019-05-12 MED ORDER — PROTAMINE SULFATE 10 MG/ML IV SOLN
INTRAVENOUS | Status: DC | PRN
  Administered 2019-05-12: 10 mg via INTRAVENOUS
  Administered 2019-05-12: 5 mg via INTRAVENOUS
  Administered 2019-05-12: 10 mg via INTRAVENOUS

## 2019-05-12 MED ORDER — CHLORHEXIDINE GLUCONATE 4 % EX LIQD: 60.0000 mL | Freq: Once | CUTANEOUS | Status: DC

## 2019-05-12 MED ORDER — 0.9 % SODIUM CHLORIDE (POUR BTL) OPTIME
TOPICAL | Status: DC | PRN
  Administered 2019-05-12: 1000 mL

## 2019-05-12 MED ORDER — LIDOCAINE-EPINEPHRINE (PF) 1 %-1:200000 IJ SOLN
INTRAMUSCULAR | Status: AC
  Filled 2019-05-12: qty 30

## 2019-05-12 MED ORDER — HEMOSTATIC AGENTS (NO CHARGE) OPTIME
TOPICAL | Status: DC | PRN
  Administered 2019-05-12: 1 via TOPICAL

## 2019-05-12 MED ORDER — FENTANYL CITRATE (PF) 250 MCG/5ML IJ SOLN
INTRAMUSCULAR | Status: AC
  Filled 2019-05-12: qty 5

## 2019-05-12 MED ORDER — ONDANSETRON HCL 4 MG/2ML IJ SOLN: 4.0000 mg | Freq: Once | INTRAMUSCULAR | Status: DC | PRN

## 2019-05-12 MED ORDER — ONDANSETRON HCL 4 MG/2ML IJ SOLN
INTRAMUSCULAR | Status: DC | PRN
  Administered 2019-05-12: 4 mg via INTRAVENOUS

## 2019-05-12 MED ORDER — PROPOFOL 10 MG/ML IV BOLUS
INTRAVENOUS | Status: DC | PRN
  Administered 2019-05-12 (×2): 10 mg via INTRAVENOUS
  Administered 2019-05-12: 30 mg via INTRAVENOUS
  Administered 2019-05-12: 20 mg via INTRAVENOUS
  Administered 2019-05-12: 10 mg via INTRAVENOUS

## 2019-05-12 MED ORDER — HEPARIN SODIUM (PORCINE) 1000 UNIT/ML IJ SOLN
INTRAMUSCULAR | Status: DC | PRN
  Administered 2019-05-12: 3000 [IU] via INTRAVENOUS

## 2019-05-12 MED ORDER — SODIUM CHLORIDE 0.9 % IV SOLN
INTRAVENOUS | Status: DC
  Administered 2019-05-12 (×2): via INTRAVENOUS

## 2019-05-12 MED ORDER — FENTANYL CITRATE (PF) 100 MCG/2ML IJ SOLN: 25.0000 ug | INTRAMUSCULAR | Status: DC | PRN

## 2019-05-12 MED ORDER — LIDOCAINE 2% (20 MG/ML) 5 ML SYRINGE
INTRAMUSCULAR | Status: DC | PRN
  Administered 2019-05-12: 40 mg via INTRAVENOUS

## 2019-05-12 MED ORDER — DIPHENHYDRAMINE HCL 50 MG/ML IJ SOLN
INTRAMUSCULAR | Status: DC | PRN
  Administered 2019-05-12: 12.5 mg via INTRAVENOUS

## 2019-05-12 MED ORDER — PROPOFOL 500 MG/50ML IV EMUL
INTRAVENOUS | Status: DC | PRN
  Administered 2019-05-12: 50 ug/kg/min via INTRAVENOUS

## 2019-05-12 MED ORDER — HYDROCODONE-ACETAMINOPHEN 5-325 MG PO TABS: 1.0000 | ORAL_TABLET | Freq: Four times a day (QID) | ORAL | 0 refills | Status: DC | PRN

## 2019-05-12 MED ORDER — FENTANYL CITRATE (PF) 250 MCG/5ML IJ SOLN
INTRAMUSCULAR | Status: DC | PRN
  Administered 2019-05-12: 25 ug via INTRAVENOUS
  Administered 2019-05-12: 50 ug via INTRAVENOUS
  Administered 2019-05-12: 25 ug via INTRAVENOUS

## 2019-05-12 SURGICAL SUPPLY — 37 items
ADH SKN CLS APL DERMABOND .7 (GAUZE/BANDAGES/DRESSINGS) ×1
ARMBAND PINK RESTRICT EXTREMIT (MISCELLANEOUS) ×4 IMPLANT
CANISTER SUCT 3000ML PPV (MISCELLANEOUS) ×3 IMPLANT
CLIP VESOCCLUDE MED 6/CT (CLIP) ×3 IMPLANT
CLIP VESOCCLUDE SM WIDE 6/CT (CLIP) ×3 IMPLANT
COVER PROBE W GEL 5X96 (DRAPES) ×3 IMPLANT
COVER WAND RF STERILE (DRAPES) ×1 IMPLANT
DERMABOND ADVANCED (GAUZE/BANDAGES/DRESSINGS) ×2
DERMABOND ADVANCED .7 DNX12 (GAUZE/BANDAGES/DRESSINGS) ×1 IMPLANT
ELECT REM PT RETURN 9FT ADLT (ELECTROSURGICAL) ×3
ELECTRODE REM PT RTRN 9FT ADLT (ELECTROSURGICAL) ×1 IMPLANT
FELT TEFLON 1X6 (MISCELLANEOUS) ×2 IMPLANT
GLOVE BIO SURGEON STRL SZ7.5 (GLOVE) ×2 IMPLANT
GLOVE BIOGEL PI IND STRL 6.5 (GLOVE) IMPLANT
GLOVE BIOGEL PI IND STRL 7.0 (GLOVE) IMPLANT
GLOVE BIOGEL PI IND STRL 7.5 (GLOVE) ×1 IMPLANT
GLOVE BIOGEL PI INDICATOR 6.5 (GLOVE) ×2
GLOVE BIOGEL PI INDICATOR 7.0 (GLOVE) ×4
GLOVE BIOGEL PI INDICATOR 7.5 (GLOVE) ×8
GLOVE SURG SS PI 7.5 STRL IVOR (GLOVE) ×3 IMPLANT
GOWN STRL REUS W/ TWL LRG LVL3 (GOWN DISPOSABLE) ×2 IMPLANT
GOWN STRL REUS W/ TWL XL LVL3 (GOWN DISPOSABLE) ×1 IMPLANT
GOWN STRL REUS W/TWL LRG LVL3 (GOWN DISPOSABLE) ×6
GOWN STRL REUS W/TWL XL LVL3 (GOWN DISPOSABLE) ×6
HEMOSTAT SNOW SURGICEL 2X4 (HEMOSTASIS) ×4 IMPLANT
KIT BASIN OR (CUSTOM PROCEDURE TRAY) ×3 IMPLANT
KIT TURNOVER KIT B (KITS) ×3 IMPLANT
NS IRRIG 1000ML POUR BTL (IV SOLUTION) ×3 IMPLANT
PACK CV ACCESS (CUSTOM PROCEDURE TRAY) ×3 IMPLANT
PAD ARMBOARD 7.5X6 YLW CONV (MISCELLANEOUS) ×6 IMPLANT
SUT PROLENE 6 0 CC (SUTURE) ×7 IMPLANT
SUT VIC AB 3-0 SH 27 (SUTURE) ×3
SUT VIC AB 3-0 SH 27X BRD (SUTURE) ×1 IMPLANT
SUT VICRYL 4-0 PS2 18IN ABS (SUTURE) ×3 IMPLANT
TOWEL GREEN STERILE (TOWEL DISPOSABLE) ×3 IMPLANT
UNDERPAD 30X30 (UNDERPADS AND DIAPERS) ×3 IMPLANT
WATER STERILE IRR 1000ML POUR (IV SOLUTION) ×3 IMPLANT

## 2019-05-12 NOTE — Anesthesia Preprocedure Evaluation (Addendum)
Anesthesia Evaluation  Patient identified by MRN, date of birth, ID band Patient awake    Reviewed: Allergy & Precautions, NPO status , Patient's Chart, lab work & pertinent test results  History of Anesthesia Complications Negative for: history of anesthetic complications  Airway Mallampati: II  TM Distance: >3 FB Neck ROM: Full    Dental  (+) Dental Advisory Given, Chipped   Pulmonary asthma ,    breath sounds clear to auscultation       Cardiovascular hypertension, Pt. on medications +CHF   Rhythm:Regular Rate:Normal     Neuro/Psych PSYCHIATRIC DISORDERS Depression negative neurological ROS     GI/Hepatic negative GI ROS, Neg liver ROS,   Endo/Other  diabetes, Type 2, Oral Hypoglycemic AgentsHypothyroidism   Renal/GU ESRFRenal disease     Musculoskeletal  (+) Arthritis ,   Abdominal   Peds  Hematology negative hematology ROS (+)   Anesthesia Other Findings   Reproductive/Obstetrics                            Anesthesia Physical Anesthesia Plan  ASA: IV  Anesthesia Plan: MAC   Post-op Pain Management:    Induction: Intravenous  PONV Risk Score and Plan: 2 and Propofol infusion and Treatment may vary due to age or medical condition  Airway Management Planned: Natural Airway and Simple Face Mask  Additional Equipment: None  Intra-op Plan:   Post-operative Plan:   Informed Consent: I have reviewed the patients History and Physical, chart, labs and discussed the procedure including the risks, benefits and alternatives for the proposed anesthesia with the patient or authorized representative who has indicated his/her understanding and acceptance.       Plan Discussed with: CRNA and Anesthesiologist  Anesthesia Plan Comments:        Anesthesia Quick Evaluation

## 2019-05-12 NOTE — Transfer of Care (Signed)
Immediate Anesthesia Transfer of Care Note  Patient: Cassandra Baker  Procedure(s) Performed: BRACHIOCEPHALIC ARTERIOVENOUS (AV) FISTULA CREATION LEFT ARM (Left Arm Upper)  Patient Location: PACU  Anesthesia Type:MAC  Level of Consciousness: awake, alert  and oriented  Airway & Oxygen Therapy: Patient Spontanous Breathing  Post-op Assessment: Report given to RN, Post -op Vital signs reviewed and stable and Patient moving all extremities X 4  Post vital signs: Reviewed and stable  Last Vitals:  Vitals Value Taken Time  BP 154/71 05/12/2019 12:02 PM  Temp 36.1 C 05/12/2019 12:00 PM  Pulse 94 05/12/2019 12:02 PM  Resp 14 05/12/2019 12:02 PM  SpO2 98 % 05/12/2019 12:02 PM  Vitals shown include unvalidated device data.  Last Pain:  Vitals:   05/12/19 0842  TempSrc: Oral  PainSc:          Complications: No apparent anesthesia complications

## 2019-05-12 NOTE — Interval H&P Note (Signed)
History and Physical Interval Note:  05/12/2019 10:04 AM  Cassandra Baker  has presented today for surgery, with the diagnosis of CHRONIC KIDNEY DISEASE FOR HEMODIALYSIS ACCESS.  The various methods of treatment have been discussed with the patient and family. After consideration of risks, benefits and other options for treatment, the patient has consented to  Procedure(s): ARTERIOVENOUS (AV) FISTULA CREATION LEFT ARM (Left) as a surgical intervention.  The patient's history has been reviewed, patient examined, no change in status, stable for surgery.  I have reviewed the patient's chart and labs.  Questions were answered to the patient's satisfaction.     [name]

## 2019-05-12 NOTE — Discharge Instructions (Signed)
° °  [facility] of [place] ° °Discharge Instructions ° °AV Fistula or Graft Surgery for Dialysis Access ° °Please refer to the following instructions for your post-procedure care. Your surgeon or physician assistant will discuss any changes with you. ° °Activity ° °You may drive the day following your surgery, if you are comfortable and no longer taking prescription pain medication. Resume full activity as the soreness in your incision resolves. ° °Bathing/Showering ° °You may shower after you go home. Keep your incision dry for 48 hours. Do not soak in a bathtub, hot tub, or swim until the incision heals completely. You may not shower if you have a hemodialysis catheter. ° °Incision Care ° °Clean your incision with mild soap and water after 48 hours. Pat the area dry with a clean towel. You do not need a bandage unless otherwise instructed. Do not apply any ointments or creams to your incision. You may have skin glue on your incision. Do not peel it off. It will come off on its own in about one week. Your arm may swell a bit after surgery. To reduce swelling use pillows to elevate your arm so it is above your heart. Your doctor will tell you if you need to lightly wrap your arm with an ACE bandage. ° °Diet ° °Resume your normal diet. There are not special food restrictions following this procedure. In order to heal from your surgery, it is CRITICAL to get adequate nutrition. Your body requires vitamins, minerals, and protein. Vegetables are the best source of vitamins and minerals. Vegetables also provide the perfect balance of protein. Processed food has little nutritional value, so try to avoid this. ° °Medications ° °Resume taking all of your medications. If your incision is causing pain, you may take over-the counter pain relievers such as acetaminophen (Tylenol). If you were prescribed a stronger pain medication, please be aware these medications can cause nausea and constipation. Prevent  nausea by taking the medication with a snack or meal. Avoid constipation by drinking plenty of fluids and eating foods with high amount of fiber, such as fruits, vegetables, and grains. Do not take Tylenol if you are taking prescription pain medications. ° ° ° ° °Follow up °Your surgeon may want to see you in the office following your access surgery. If so, this will be arranged at the time of your surgery. ° °Please call us immediately for any of the following conditions: ° °Increased pain, redness, drainage (pus) from your incision site °Fever of 101 degrees or higher °Severe or worsening pain at your incision site °Hand pain or numbness. ° °Reduce your risk of vascular disease: ° °Stop smoking. If you would like help, call [facility] at 1-800-QUIT-NOW (1-800-784-8669) or [place] at 336-586-4000 ° °Manage your cholesterol °Maintain a desired weight °Control your diabetes °Keep your blood pressure down ° °Dialysis ° °It will take several weeks to several months for your new dialysis access to be ready for use. Your surgeon will determine when it is OK to use it. Your nephrologist will continue to direct your dialysis. You can continue to use your Permcath until your new access is ready for use. ° °If you have any questions, please call the office at [phone]. ° °

## 2019-05-12 NOTE — Anesthesia Postprocedure Evaluation (Signed)
Anesthesia Post Note  Patient: Cassandra Baker  Procedure(s) Performed: BRACHIOCEPHALIC ARTERIOVENOUS (AV) FISTULA CREATION LEFT ARM (Left Arm Upper)     Patient location during evaluation: PACU Anesthesia Type: MAC Level of consciousness: awake and alert Pain management: pain level controlled Vital Signs Assessment: post-procedure vital signs reviewed and stable Respiratory status: spontaneous breathing, nonlabored ventilation and respiratory function stable Cardiovascular status: stable and blood pressure returned to baseline Anesthetic complications: no    Last Vitals:  Vitals:   05/12/19 1215 05/12/19 1233  BP: (!) 161/78 (!) 160/80  Pulse: 92 79  Resp: 12 12  Temp:  (!) 36.2 C  SpO2: 98% 100%    Last Pain:  Vitals:   05/12/19 1233  TempSrc:   PainSc: 0-No pain                 Audry Pili

## 2019-05-12 NOTE — Progress Notes (Signed)
Pt stated she got up in the middle of the night and couldn't sleep because she was hungry so she took (2) glucose tablets at 0300.  CBG check at 0853 was 143

## 2019-05-12 NOTE — Op Note (Signed)
° ° °  Patient name: Cassandra Baker MRN: [id number] DOB: 07-14-1941 Sex: female  05/12/2019 Pre-operative Diagnosis: CKD 4 Post-operative diagnosis:  Same Surgeon:  [name] Assistants: Arlee Muslim Procedure:   Left brachiocephalic fistula Anesthesia: MAC Blood Loss: Minimal Specimens: None  Findings: 3-4 mm cephalic vein which had to be fully mobilized up the arm in order to reach the brachial artery which was a 3-4 mm healthy artery.  Indications: The patient is in need of dialysis access.  She comes in today for fistula creation.  Procedure:  The patient was identified in the holding area and taken to [place] 16  The patient was then placed supine on the table. MAC anesthesia was administered.  The patient was prepped and draped in the usual sterile fashion.  A time out was called and antibiotics were administered.  Ultrasound was used to evaluate the cephalic and basilic vein in the upper arm.  Both appear to be adequate conduit for fistula creation.  The cephalic vein was selected even though it was in a rather remote place from the brachial artery.  A transverse incision was made antecubital crease after anesthetizing the skin with 1% lidocaine.  I dissected out the cephalic vein and fully mobilized it, ligating side branches between silk ties.  It was marked for orientation.  I then dissected out the brachial artery which was a 3-4 mm healthy artery.  It was encircled with Vesseloops proximally distally.  Patient was then given 3000 units of heparin.  I ligated the cephalic vein distally.  It distended nicely with heparin saline.  I then occluded the brachial artery with vascular clamps.  #11 blade was used to make an arteriotomy which extended longitudinally with Potts scissors.  The vein was spatulated for size the arteriotomy running anastomosis was created 6-0 Prolene.  Prior to completion the appropriate flushing maneuvers were performed the anastomosis was completed.  I inspected  the course of the vein in the upper arm.  It appeared to be kinked and so I had to fully mobilized every aspect of the fistula that I could visualize, looking up under the skin using a [employer] retractor.  Ultimately had the vein freed up to where there were no kinks.  There was an audible thrill via Doppler.  The patient had a palpable radial pulse.  The vein at this point had distended nicely to about 4 mm.  Wound was then irrigated.  25 mg of protamine was used to reverse the heparin.  Once hemostasis was satisfactory the incision was closed with 2 layers of 3-0 Vicryl followed by Dermabond.   Disposition: To PACU stable.   Theotis Burrow, M.D., [facility] of [place] Office: [phone] Pager:  [phone]

## 2019-05-13 ENCOUNTER — Encounter (HOSPITAL_COMMUNITY): Payer: Self-pay | Admitting: Surgery

## 2019-05-18 ENCOUNTER — Encounter (HOSPITAL_COMMUNITY): Payer: Self-pay | Admitting: Surgery

## 2019-06-21 ENCOUNTER — Other Ambulatory Visit: Payer: Self-pay

## 2019-06-21 DIAGNOSIS — N184 Chronic kidney disease, stage 4 (severe): Secondary | ICD-10-CM

## 2019-06-27 ENCOUNTER — Encounter: Payer: Self-pay | Admitting: Surgery

## 2019-06-27 ENCOUNTER — Other Ambulatory Visit: Payer: Self-pay

## 2019-06-27 ENCOUNTER — Ambulatory Visit (HOSPITAL_COMMUNITY)
Admission: RE | Admit: 2019-06-27 | Discharge: 2019-06-27 | Disposition: A | Discharge status: Home / Self Care | Payer: Medicare Other | Source: Ambulatory Visit | Attending: Surgery | Admitting: Surgery

## 2019-06-27 ENCOUNTER — Ambulatory Visit (INDEPENDENT_AMBULATORY_CARE_PROVIDER_SITE_OTHER): Payer: Self-pay | Admitting: Surgery

## 2019-06-27 VITALS — BP 118/58 | HR 79 | Temp 97.1°F | Resp 20 | Ht 64.0 in | Wt 169.0 lb

## 2019-06-27 DIAGNOSIS — N184 Chronic kidney disease, stage 4 (severe): Secondary | ICD-10-CM | POA: Diagnosis not present

## 2019-06-27 NOTE — Progress Notes (Signed)
Patient name: Cassandra Baker MRN: [id number] DOB: 10/07/1941 Sex: female  REASON FOR VISIT:     post op  HISTORY OF PRESENT ILLNESS:   Cassandra Baker is a 78 y.o. 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  On 05/12/2019 she underwent a left brachiocephalic fistula.  She had a 3-4 mm cephalic vein.  This had to be fully mobilized up the arm in order for it to reach the brachial artery which was also a healthy 3 to 4 mm artery.  She has had some numbness in the tips of her fingers however this is gotten better  CURRENT MEDICATIONS:    Current Outpatient Medications  Medication Sig Dispense Refill  . acetaminophen (TYLENOL) 500 MG tablet Take 500 mg by mouth daily as needed (pain).    [name] Kitchen allopurinol (ZYLOPRIM) 100 MG tablet Take 100 mg by mouth daily as needed (for gout flares).     [name] Kitchen aspirin 325 MG EC tablet Take 325 mg by mouth at bedtime as needed (inflammation/pain.).     [name] Kitchen calcitRIOL (ROCALTROL) 0.5 MCG capsule Take 0.5 mcg by mouth every morning.    [name] Kitchen CALCIUM PO Take 1 tablet by mouth 2 (two) times a week.    . Ferrous Sulfate (IRON PO) Take 1 tablet by mouth 2 (two) times a week.    . fexofenadine (ALLEGRA) 60 MG tablet Take 60 mg by mouth daily as needed for allergies or rhinitis.    [name] Kitchen FLUoxetine (PROZAC) 10 MG capsule Take 10 mg by mouth at bedtime.    . furosemide (LASIX) 40 MG tablet Take 40-80 mg by mouth 2 (two) times daily. Patient varies dose based on sodium intake    . HYDROcodone-acetaminophen (NORCO) 5-325 MG tablet Take 1 tablet by mouth every 6 (six) hours as needed for moderate pain. 10 tablet 0  . hydrOXYzine (ATARAX/VISTARIL) 10 MG tablet Take 10 mg by mouth at bedtime as needed for itching.    . levothyroxine (SYNTHROID, LEVOTHROID) 112 MCG tablet Take 112 mcg by mouth daily before breakfast.    . ramipril (ALTACE) 10 MG capsule Take 10 mg by mouth at bedtime.    . sitaGLIPtin (JANUVIA) 25 MG  tablet Take 25 mg by mouth daily.    . sodium bicarbonate 650 MG tablet Take 650 mg by mouth 2 (two) times daily.     No current facility-administered medications for this visit.     REVIEW OF SYSTEMS:   [X]  denotes positive finding, [ ]  denotes negative finding Cardiac  Comments:  Chest pain or chest pressure:    Shortness of breath upon exertion:    Short of breath when lying flat:    Irregular heart rhythm:    Constitutional    Fever or chills:      PHYSICAL EXAM:   Vitals:   06/27/19 1423  BP: (!) 118/58  Pulse: 79  Resp: 20  Temp: (!) 97.1 F (36.2 C)  TempSrc: Temporal  SpO2: 96%  Weight: 169 lb (76.7 kg)  Height: 5\' 4"  (1.626 m)    GENERAL: The patient is a well-nourished female, in no acute distress. The vital signs are documented above. CARDIOVASCULAR: There is a regular rate and rhythm. PULMONARY: Non-labored respirations There is a very good thrill within her fistula.  STUDIES:   +------------+----------+-------------+----------+--------+ OUTFLOW VEINPSV (cm/s)Diameter (cm)Depth (cm)Describe +------------+----------+-------------+----------+--------+ Shoulder       158        0.46        1.13            +------------+----------+-------------+----------+--------+  Prox UA        204        0.45        1.07            +------------+----------+-------------+----------+--------+ Mid UA         104        0.64        0.32            +------------+----------+-------------+----------+--------+ Dist UA        278        0.68        0.60            +------------+----------+-------------+----------+--------+ AC Fossa       313        0.31        0.82            +------------+----------+-------------+----------+--------+    MEDICAL ISSUES:   I suspect her fistula will go on to mature.  It is a little on the small side up near the shoulder where it is also a little deeper.  Before intervening, I would like to give this more  time to see if it continues to mature.  I am and have her come back to see me in 3 months with a repeat duplex  [name], IV, MD, FACS [facility] [phone] Pager [phone]

## 2019-06-28 DIAGNOSIS — E1165 Type 2 diabetes mellitus with hyperglycemia: Secondary | ICD-10-CM | POA: Diagnosis not present

## 2019-06-28 DIAGNOSIS — I10 Essential (primary) hypertension: Secondary | ICD-10-CM | POA: Diagnosis not present

## 2019-06-28 DIAGNOSIS — E890 Postprocedural hypothyroidism: Secondary | ICD-10-CM | POA: Diagnosis not present

## 2019-06-28 DIAGNOSIS — N189 Chronic kidney disease, unspecified: Secondary | ICD-10-CM | POA: Diagnosis not present

## 2019-08-22 DIAGNOSIS — E1169 Type 2 diabetes mellitus with other specified complication: Secondary | ICD-10-CM | POA: Diagnosis not present

## 2019-08-22 DIAGNOSIS — E782 Mixed hyperlipidemia: Secondary | ICD-10-CM | POA: Diagnosis not present

## 2019-08-22 DIAGNOSIS — F3341 Major depressive disorder, recurrent, in partial remission: Secondary | ICD-10-CM | POA: Diagnosis not present

## 2019-08-22 DIAGNOSIS — E7800 Pure hypercholesterolemia, unspecified: Secondary | ICD-10-CM | POA: Diagnosis not present

## 2019-08-22 DIAGNOSIS — E1122 Type 2 diabetes mellitus with diabetic chronic kidney disease: Secondary | ICD-10-CM | POA: Diagnosis not present

## 2019-08-22 DIAGNOSIS — E039 Hypothyroidism, unspecified: Secondary | ICD-10-CM | POA: Diagnosis not present

## 2019-08-22 DIAGNOSIS — N183 Chronic kidney disease, stage 3 (moderate): Secondary | ICD-10-CM | POA: Diagnosis not present

## 2019-08-22 DIAGNOSIS — I10 Essential (primary) hypertension: Secondary | ICD-10-CM | POA: Diagnosis not present

## 2019-08-22 DIAGNOSIS — E119 Type 2 diabetes mellitus without complications: Secondary | ICD-10-CM | POA: Diagnosis not present

## 2019-09-08 DIAGNOSIS — E210 Primary hyperparathyroidism: Secondary | ICD-10-CM | POA: Diagnosis not present

## 2019-09-08 DIAGNOSIS — N189 Chronic kidney disease, unspecified: Secondary | ICD-10-CM | POA: Diagnosis not present

## 2019-09-08 DIAGNOSIS — Z23 Encounter for immunization: Secondary | ICD-10-CM | POA: Diagnosis not present

## 2019-09-08 DIAGNOSIS — E039 Hypothyroidism, unspecified: Secondary | ICD-10-CM | POA: Diagnosis not present

## 2019-09-08 DIAGNOSIS — M109 Gout, unspecified: Secondary | ICD-10-CM | POA: Diagnosis not present

## 2019-09-08 DIAGNOSIS — I5189 Other ill-defined heart diseases: Secondary | ICD-10-CM | POA: Diagnosis not present

## 2019-09-08 DIAGNOSIS — I120 Hypertensive chronic kidney disease with stage 5 chronic kidney disease or end stage renal disease: Secondary | ICD-10-CM | POA: Diagnosis not present

## 2019-09-08 DIAGNOSIS — N185 Chronic kidney disease, stage 5: Secondary | ICD-10-CM | POA: Diagnosis not present

## 2019-09-08 DIAGNOSIS — D631 Anemia in chronic kidney disease: Secondary | ICD-10-CM | POA: Diagnosis not present

## 2019-09-08 DIAGNOSIS — E1122 Type 2 diabetes mellitus with diabetic chronic kidney disease: Secondary | ICD-10-CM | POA: Diagnosis not present

## 2019-09-08 DIAGNOSIS — E7800 Pure hypercholesterolemia, unspecified: Secondary | ICD-10-CM | POA: Diagnosis not present

## 2019-09-20 ENCOUNTER — Other Ambulatory Visit: Payer: Self-pay

## 2019-09-20 DIAGNOSIS — N184 Chronic kidney disease, stage 4 (severe): Secondary | ICD-10-CM

## 2019-09-21 ENCOUNTER — Telehealth (HOSPITAL_COMMUNITY): Payer: Self-pay

## 2019-09-21 NOTE — Telephone Encounter (Signed)

## 2019-09-22 ENCOUNTER — Ambulatory Visit (HOSPITAL_COMMUNITY)
Admission: RE | Admit: 2019-09-22 | Discharge: 2019-09-22 | Disposition: A | Discharge status: Home / Self Care | Payer: Medicare Other | Source: Ambulatory Visit | Attending: Family | Admitting: Family

## 2019-09-22 ENCOUNTER — Other Ambulatory Visit: Payer: Self-pay

## 2019-09-22 DIAGNOSIS — N184 Chronic kidney disease, stage 4 (severe): Secondary | ICD-10-CM | POA: Diagnosis not present

## 2019-09-26 ENCOUNTER — Encounter: Payer: Self-pay | Admitting: Surgery

## 2019-09-26 ENCOUNTER — Encounter: Payer: Self-pay | Admitting: *Deleted

## 2019-09-26 ENCOUNTER — Other Ambulatory Visit: Payer: Self-pay | Admitting: *Deleted

## 2019-09-26 ENCOUNTER — Ambulatory Visit (INDEPENDENT_AMBULATORY_CARE_PROVIDER_SITE_OTHER): Payer: Medicare Other | Admitting: Surgery

## 2019-09-26 ENCOUNTER — Other Ambulatory Visit: Payer: Self-pay

## 2019-09-26 VITALS — BP 128/62 | HR 81 | Temp 98.1°F | Resp 16 | Ht 64.0 in | Wt 168.0 lb

## 2019-09-26 DIAGNOSIS — N185 Chronic kidney disease, stage 5: Secondary | ICD-10-CM | POA: Diagnosis not present

## 2019-09-26 NOTE — H&P (View-Only) (Signed)
[facility] of [facility]  Patient name: Cassandra Baker MRN: [id number] DOB: 1941-11-27 Sex: female   REASON FOR VISIT:    Follow up  HISOTRY OF PRESENT ILLNESS:    Cassandra Baker is a 78 y.o. 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  On 05/12/2019 she underwent a left brachiocephalic fistula.  She had a 3-4 mm cephalic vein.  This had to be fully mobilized up the arm in order for it to reach the brachial artery which was also a healthy 3 to 4 mm artery.  She has had some numbness in the tips of her fingers however this is gotten better  PAST MEDICAL HISTORY:   Past Medical History:  Diagnosis Date  . Arthritis    low back,knees  . Asthma   . Cancer ([place])    Basal cell nose two to three years ago/endometrial  02-09-18  . CHF (congestive heart failure) (HCC)    Has shortness of breath if she does not take Lasix  . Chronic kidney disease    Stated it is 29% functioning  . Depression   . Diabetes mellitus without complication ([place])    Not on medication  . Dyspnea   . Hypertension   . Hypothyroidism      FAMILY HISTORY:   Family History  Problem Relation Age of Onset  . Hypertension Mother   . Heart disease Mother   . Hypertension Father   . Heart disease Father   . Cervical cancer Maternal Aunt     SOCIAL HISTORY:   Social History   Tobacco Use  . Smoking status: Never Smoker  . Smokeless tobacco: Never Used  Substance Use Topics  . Alcohol use: No     ALLERGIES:   Allergies  Allergen Reactions  . Plum Pulp Anaphylaxis and Cough  . Citrus     Coughing, chest tightness   . Eggs Or Egg-Derived Products Itching    congestion  . Other Itching    Dairy products  . Penicillins Rash    Has patient had a PCN reaction causing immediate rash, facial/tongue/throat swelling, SOB or lightheadedness with hypotension: Yes Has patient had a PCN reaction causing severe rash  involving mucus membranes or skin necrosis: No Has patient had a PCN reaction that required hospitalization: No Has patient had a PCN reaction occurring within the last 10 years: No If all of the above answers are "NO", then may proceed with Cephalosporin use.      CURRENT MEDICATIONS:   Current Outpatient Medications  Medication Sig Dispense Refill  . acetaminophen (TYLENOL) 500 MG tablet Take 500 mg by mouth daily as needed (pain).    [name] Kitchen allopurinol (ZYLOPRIM) 100 MG tablet Take 100 mg by mouth daily as needed (for gout flares).     [name] Kitchen aspirin 325 MG EC tablet Take 325 mg by mouth at bedtime as needed (inflammation/pain.).     [name] Kitchen calcitRIOL (ROCALTROL) 0.5 MCG capsule Take 0.5 mcg by mouth every morning.    [name] Kitchen CALCIUM PO Take 1 tablet by mouth 2 (two) times a week.    . Ferrous Sulfate (IRON PO) Take 1 tablet by mouth 2 (two) times a week.    . fexofenadine (ALLEGRA) 60 MG tablet Take 60 mg by mouth daily as needed for allergies or rhinitis.    [name] Kitchen FLUoxetine (PROZAC) 10 MG capsule Take 10 mg by mouth at bedtime.    . furosemide (LASIX) 40 MG tablet Take 40-80 mg  by mouth 2 (two) times daily. Patient varies dose based on sodium intake    . hydrOXYzine (ATARAX/VISTARIL) 10 MG tablet Take 10 mg by mouth at bedtime as needed for itching.    . levothyroxine (SYNTHROID, LEVOTHROID) 112 MCG tablet Take 112 mcg by mouth daily before breakfast.    . ramipril (ALTACE) 10 MG capsule Take 10 mg by mouth at bedtime.    . sitaGLIPtin (JANUVIA) 25 MG tablet Take 25 mg by mouth daily.    . sodium bicarbonate 650 MG tablet Take 650 mg by mouth 2 (two) times daily.    [name] Kitchen HYDROcodone-acetaminophen (NORCO) 5-325 MG tablet Take 1 tablet by mouth every 6 (six) hours as needed for moderate pain. (Patient not taking: Reported on 09/26/2019) 10 tablet 0   No current facility-administered medications for this visit.     REVIEW OF SYSTEMS:   [X]  denotes positive finding, [ ]  denotes negative finding Cardiac   Comments:  Chest pain or chest pressure:    Shortness of breath upon exertion:    Short of breath when lying flat:    Irregular heart rhythm:        Vascular    Pain in calf, thigh, or hip brought on by ambulation:    Pain in feet at night that wakes you up from your sleep:     Blood clot in your veins:    Leg swelling:         Pulmonary    Oxygen at home:    Productive cough:     Wheezing:         Neurologic    Sudden weakness in arms or legs:     Sudden numbness in arms or legs:     Sudden onset of difficulty speaking or slurred speech:    Temporary loss of vision in one eye:     Problems with dizziness:         Gastrointestinal    Blood in stool:     Vomited blood:         Genitourinary    Burning when urinating:     Blood in urine:        Psychiatric    Major depression:         Hematologic    Bleeding problems:    Problems with blood clotting too easily:        Skin    Rashes or ulcers:        Constitutional    Fever or chills:      PHYSICAL EXAM:   Vitals:   09/26/19 1342  BP: 128/62  Pulse: 81  Resp: 16  Temp: 98.1 F (36.7 C)  TempSrc: Temporal  SpO2: 98%  Weight: 168 lb (76.2 kg)  Height: 5\' 4"  (1.626 m)    GENERAL: The patient is a well-nourished female, in no acute distress. The vital signs are documented above. CARDIAC: There is a regular rate and rhythm.  VASCULAR: palpable thrill in avf, most prominent at the [facility] up to mid upper arm PULMONARY: Non-labored respirations MUSCULOSKELETAL: There are no major deformities or cyanosis. NEUROLOGIC: No focal weakness or paresthesias are detected. SKIN: There are no ulcers or rashes noted. PSYCHIATRIC: The patient has a normal affect.  STUDIES:   I have reviewed the following : +------------+----------+-------------+----------+--------+ OUTFLOW VEINPSV (cm/s)Diameter (cm)Depth (cm)Describe +------------+----------+-------------+----------+--------+ Shoulder       154        0.43         1.46            +------------+----------+-------------+----------+--------+  Prox UA        108        0.52        1.09            +------------+----------+-------------+----------+--------+ Mid UA         108        0.68        0.26            +------------+----------+-------------+----------+--------+ Dist UA        229        0.76        0.48            +------------+----------+-------------+----------+--------+ AC Fossa       214        0.56        0.40            +------------+----------+-------------+----------+--------+      MEDICAL ISSUES:   The patient's GFR is down to 10%.  Her fistula continues to develop however I feel in the upper arm it remains too deep.  Therefore I have proposed fistula elevation.  I told her that I would likely just need to make one incision in the upper arm as the fistula is not too deep in the mid upper arm.  We will proceed with elevation.  Have scheduled her for Thursday, October 15.    Leia Alf, MD, FACS [facility] [phone] Pager [phone]

## 2019-09-26 NOTE — Progress Notes (Signed)
Vascular and Vein Specialist of [facility]  Patient name: Cassandra Baker MRN: [id number] DOB: 03/02/1941 Sex: female   REASON FOR VISIT:    Follow up  HISOTRY OF PRESENT ILLNESS:    Cassandra Baker is a 78 y.o. 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  On 05/12/2019 she underwent a left brachiocephalic fistula.  She had a 3-4 mm cephalic vein.  This had to be fully mobilized up the arm in order for it to reach the brachial artery which was also a healthy 3 to 4 mm artery.  She has had some numbness in the tips of her fingers however this is gotten better  PAST MEDICAL HISTORY:   Past Medical History:  Diagnosis Date  . Arthritis    low back,knees  . Asthma   . Cancer ([place])    Basal cell nose two to three years ago/endometrial  02-09-18  . CHF (congestive heart failure) (HCC)    Has shortness of breath if she does not take Lasix  . Chronic kidney disease    Stated it is 29% functioning  . Depression   . Diabetes mellitus without complication ([place])    Not on medication  . Dyspnea   . Hypertension   . Hypothyroidism      FAMILY HISTORY:   Family History  Problem Relation Age of Onset  . Hypertension Mother   . Heart disease Mother   . Hypertension Father   . Heart disease Father   . Cervical cancer Maternal Aunt     SOCIAL HISTORY:   Social History   Tobacco Use  . Smoking status: Never Smoker  . Smokeless tobacco: Never Used  Substance Use Topics  . Alcohol use: No     ALLERGIES:   Allergies  Allergen Reactions  . Plum Pulp Anaphylaxis and Cough  . Citrus     Coughing, chest tightness   . Eggs Or Egg-Derived Products Itching    congestion  . Other Itching    Dairy products  . Penicillins Rash    Has patient had a PCN reaction causing immediate rash, facial/tongue/throat swelling, SOB or lightheadedness with hypotension: Yes Has patient had a PCN reaction causing severe rash  involving mucus membranes or skin necrosis: No Has patient had a PCN reaction that required hospitalization: No Has patient had a PCN reaction occurring within the last 10 years: No If all of the above answers are "NO", then may proceed with Cephalosporin use.      CURRENT MEDICATIONS:   Current Outpatient Medications  Medication Sig Dispense Refill  . acetaminophen (TYLENOL) 500 MG tablet Take 500 mg by mouth daily as needed (pain).    [name] Kitchen allopurinol (ZYLOPRIM) 100 MG tablet Take 100 mg by mouth daily as needed (for gout flares).     [name] Kitchen aspirin 325 MG EC tablet Take 325 mg by mouth at bedtime as needed (inflammation/pain.).     [name] Kitchen calcitRIOL (ROCALTROL) 0.5 MCG capsule Take 0.5 mcg by mouth every morning.    [name] Kitchen CALCIUM PO Take 1 tablet by mouth 2 (two) times a week.    . Ferrous Sulfate (IRON PO) Take 1 tablet by mouth 2 (two) times a week.    . fexofenadine (ALLEGRA) 60 MG tablet Take 60 mg by mouth daily as needed for allergies or rhinitis.    [name] Kitchen FLUoxetine (PROZAC) 10 MG capsule Take 10 mg by mouth at bedtime.    . furosemide (LASIX) 40 MG tablet Take 40-80 mg  by mouth 2 (two) times daily. Patient varies dose based on sodium intake    . hydrOXYzine (ATARAX/VISTARIL) 10 MG tablet Take 10 mg by mouth at bedtime as needed for itching.    . levothyroxine (SYNTHROID, LEVOTHROID) 112 MCG tablet Take 112 mcg by mouth daily before breakfast.    . ramipril (ALTACE) 10 MG capsule Take 10 mg by mouth at bedtime.    . sitaGLIPtin (JANUVIA) 25 MG tablet Take 25 mg by mouth daily.    . sodium bicarbonate 650 MG tablet Take 650 mg by mouth 2 (two) times daily.    [name] Kitchen HYDROcodone-acetaminophen (NORCO) 5-325 MG tablet Take 1 tablet by mouth every 6 (six) hours as needed for moderate pain. (Patient not taking: Reported on 09/26/2019) 10 tablet 0   No current facility-administered medications for this visit.     REVIEW OF SYSTEMS:   [X]  denotes positive finding, [ ]  denotes negative finding Cardiac   Comments:  Chest pain or chest pressure:    Shortness of breath upon exertion:    Short of breath when lying flat:    Irregular heart rhythm:        Vascular    Pain in calf, thigh, or hip brought on by ambulation:    Pain in feet at night that wakes you up from your sleep:     Blood clot in your veins:    Leg swelling:         Pulmonary    Oxygen at home:    Productive cough:     Wheezing:         Neurologic    Sudden weakness in arms or legs:     Sudden numbness in arms or legs:     Sudden onset of difficulty speaking or slurred speech:    Temporary loss of vision in one eye:     Problems with dizziness:         Gastrointestinal    Blood in stool:     Vomited blood:         Genitourinary    Burning when urinating:     Blood in urine:        Psychiatric    Major depression:         Hematologic    Bleeding problems:    Problems with blood clotting too easily:        Skin    Rashes or ulcers:        Constitutional    Fever or chills:      PHYSICAL EXAM:   Vitals:   09/26/19 1342  BP: 128/62  Pulse: 81  Resp: 16  Temp: 98.1 F (36.7 C)  TempSrc: Temporal  SpO2: 98%  Weight: 168 lb (76.2 kg)  Height: 5\' 4"  (1.626 m)    GENERAL: The patient is a well-nourished female, in no acute distress. The vital signs are documented above. CARDIAC: There is a regular rate and rhythm.  VASCULAR: palpable thrill in avf, most prominent at the [facility] up to mid upper arm PULMONARY: Non-labored respirations MUSCULOSKELETAL: There are no major deformities or cyanosis. NEUROLOGIC: No focal weakness or paresthesias are detected. SKIN: There are no ulcers or rashes noted. PSYCHIATRIC: The patient has a normal affect.  STUDIES:   I have reviewed the following : +------------+----------+-------------+----------+--------+ OUTFLOW VEINPSV (cm/s)Diameter (cm)Depth (cm)Describe +------------+----------+-------------+----------+--------+ Shoulder       154        0.43         1.46            +------------+----------+-------------+----------+--------+  Prox UA        108        0.52        1.09            +------------+----------+-------------+----------+--------+ Mid UA         108        0.68        0.26            +------------+----------+-------------+----------+--------+ Dist UA        229        0.76        0.48            +------------+----------+-------------+----------+--------+ AC Fossa       214        0.56        0.40            +------------+----------+-------------+----------+--------+      MEDICAL ISSUES:   The patient's GFR is down to 10%.  Her fistula continues to develop however I feel in the upper arm it remains too deep.  Therefore I have proposed fistula elevation.  I told her that I would likely just need to make one incision in the upper arm as the fistula is not too deep in the mid upper arm.  We will proceed with elevation.  Have scheduled her for Thursday, October 15.    Leia Alf, MD, FACS [facility] [phone] Pager [phone]

## 2019-10-03 ENCOUNTER — Other Ambulatory Visit
Admission: RE | Admit: 2019-10-03 | Discharge: 2019-10-03 | Disposition: A | Discharge status: Home / Self Care | Payer: Medicare Other | Source: Ambulatory Visit | Attending: Surgery | Admitting: Surgery

## 2019-10-03 DIAGNOSIS — Z20828 Contact with and (suspected) exposure to other viral communicable diseases: Secondary | ICD-10-CM | POA: Diagnosis not present

## 2019-10-03 DIAGNOSIS — Z01812 Encounter for preprocedural laboratory examination: Secondary | ICD-10-CM | POA: Insufficient documentation

## 2019-10-04 ENCOUNTER — Other Ambulatory Visit: Payer: Self-pay

## 2019-10-04 ENCOUNTER — Encounter (HOSPITAL_COMMUNITY): Payer: Self-pay | Admitting: *Deleted

## 2019-10-04 LAB — SARS CORONAVIRUS 2 (TAT 6-24 HRS): SARS Coronavirus 2: NEGATIVE

## 2019-10-04 NOTE — Progress Notes (Signed)
Spoke with pt for pre-op call. Pt denies cardiac history, she states she thinks she may have had Congestive heart failure a few years ago. Pt is a type 2 diabetic. Last A1C was 6.something per pt about 2-3 months ago. Dr. Suzette Battiest is her endocrinologist. She states she does not check her blood sugar at home often. She does have a meter to do so though. Instructed pt to check her blood sugar Thursday AM when she gets up and every 2 hours until she leaves for the hospital. If blood sugar is 70 or below, treat with 1/2 cup of clear juice (apple or cranberry) and recheck blood sugar 15 minutes after drinking juice. If blood sugar continues to be 70 or below, call the Short Stay department and ask to speak to a nurse. Pt voiced understanding.  Pt had her Covid test done yesterday, 10/03/19 - it is negative. Pt states she has been in quarantine since the test was done and understands that she needs to continue to be in quarantine until day of surgery.  Pt is not sure who will be picking her up after surgery, she will be able to tell us day of surgery. She states her husband will be home once she gets home.

## 2019-10-05 ENCOUNTER — Telehealth: Payer: Self-pay | Admitting: *Deleted

## 2019-10-05 NOTE — Telephone Encounter (Signed)
Call to patient and agreeable to be at [facility] admitting at 5:30 am on 10/06/2019 for surgery. All pre-op instructions unchanged. Verbalized understanding.

## 2019-10-06 ENCOUNTER — Encounter (HOSPITAL_COMMUNITY): Payer: Self-pay | Admitting: *Deleted

## 2019-10-06 ENCOUNTER — Ambulatory Visit (HOSPITAL_COMMUNITY)
Admission: RE | Admit: 2019-10-06 | Discharge: 2019-10-06 | Disposition: A | Discharge status: Home / Self Care | Payer: Medicare Other | Attending: Surgery | Admitting: Surgery

## 2019-10-06 ENCOUNTER — Ambulatory Visit (HOSPITAL_COMMUNITY): Payer: Medicare Other | Admitting: Anesthesiology

## 2019-10-06 ENCOUNTER — Encounter (HOSPITAL_COMMUNITY)
Admission: RE | Disposition: A | Discharge status: Home / Self Care | Payer: Self-pay | Source: Home / Self Care | Attending: Surgery

## 2019-10-06 ENCOUNTER — Other Ambulatory Visit: Payer: Self-pay

## 2019-10-06 DIAGNOSIS — Z79899 Other long term (current) drug therapy: Secondary | ICD-10-CM | POA: Diagnosis not present

## 2019-10-06 DIAGNOSIS — J45909 Unspecified asthma, uncomplicated: Secondary | ICD-10-CM | POA: Diagnosis not present

## 2019-10-06 DIAGNOSIS — Y832 Surgical operation with anastomosis, bypass or graft as the cause of abnormal reaction of the patient, or of later complication, without mention of misadventure at the time of the procedure: Secondary | ICD-10-CM | POA: Insufficient documentation

## 2019-10-06 DIAGNOSIS — I130 Hypertensive heart and chronic kidney disease with heart failure and stage 1 through stage 4 chronic kidney disease, or unspecified chronic kidney disease: Secondary | ICD-10-CM | POA: Insufficient documentation

## 2019-10-06 DIAGNOSIS — M170 Bilateral primary osteoarthritis of knee: Secondary | ICD-10-CM | POA: Diagnosis not present

## 2019-10-06 DIAGNOSIS — E1122 Type 2 diabetes mellitus with diabetic chronic kidney disease: Secondary | ICD-10-CM | POA: Diagnosis not present

## 2019-10-06 DIAGNOSIS — I509 Heart failure, unspecified: Secondary | ICD-10-CM | POA: Diagnosis not present

## 2019-10-06 DIAGNOSIS — Z880 Allergy status to penicillin: Secondary | ICD-10-CM | POA: Insufficient documentation

## 2019-10-06 DIAGNOSIS — N185 Chronic kidney disease, stage 5: Secondary | ICD-10-CM | POA: Diagnosis not present

## 2019-10-06 DIAGNOSIS — Z79890 Hormone replacement therapy (postmenopausal): Secondary | ICD-10-CM | POA: Insufficient documentation

## 2019-10-06 DIAGNOSIS — M479 Spondylosis, unspecified: Secondary | ICD-10-CM | POA: Insufficient documentation

## 2019-10-06 DIAGNOSIS — T82898A Other specified complication of vascular prosthetic devices, implants and grafts, initial encounter: Secondary | ICD-10-CM | POA: Diagnosis not present

## 2019-10-06 DIAGNOSIS — Z7984 Long term (current) use of oral hypoglycemic drugs: Secondary | ICD-10-CM | POA: Diagnosis not present

## 2019-10-06 DIAGNOSIS — F329 Major depressive disorder, single episode, unspecified: Secondary | ICD-10-CM | POA: Diagnosis not present

## 2019-10-06 DIAGNOSIS — Z7982 Long term (current) use of aspirin: Secondary | ICD-10-CM | POA: Diagnosis not present

## 2019-10-06 DIAGNOSIS — I132 Hypertensive heart and chronic kidney disease with heart failure and with stage 5 chronic kidney disease, or end stage renal disease: Secondary | ICD-10-CM | POA: Diagnosis not present

## 2019-10-06 DIAGNOSIS — E039 Hypothyroidism, unspecified: Secondary | ICD-10-CM | POA: Diagnosis not present

## 2019-10-06 DIAGNOSIS — N186 End stage renal disease: Secondary | ICD-10-CM | POA: Diagnosis not present

## 2019-10-06 HISTORY — DX: Anemia, unspecified: D64.9

## 2019-10-06 HISTORY — PX: FISTULA SUPERFICIALIZATION: SHX6341

## 2019-10-06 LAB — POCT I-STAT, CHEM 8
BUN: 88 mg/dL — ABNORMAL HIGH (ref 8–23)
Calcium, Ion: 1.49 mmol/L — ABNORMAL HIGH (ref 1.15–1.40)
Chloride: 106 mmol/L (ref 98–111)
Creatinine, Ser: 4.2 mg/dL — ABNORMAL HIGH (ref 0.44–1.00)
Glucose, Bld: 156 mg/dL — ABNORMAL HIGH (ref 70–99)
HCT: 31 % — ABNORMAL LOW (ref 36.0–46.0)
Hemoglobin: 10.5 g/dL — ABNORMAL LOW (ref 12.0–15.0)
Potassium: 3.9 mmol/L (ref 3.5–5.1)
Sodium: 139 mmol/L (ref 135–145)
TCO2: 26 mmol/L (ref 22–32)

## 2019-10-06 LAB — GLUCOSE, CAPILLARY: Glucose-Capillary: 171 mg/dL — ABNORMAL HIGH (ref 70–99)

## 2019-10-06 SURGERY — FISTULA SUPERFICIALIZATION
Anesthesia: Monitor Anesthesia Care | Site: Arm Upper | Laterality: Left

## 2019-10-06 MED ORDER — PROPOFOL 10 MG/ML IV BOLUS
INTRAVENOUS | Status: AC
  Filled 2019-10-06: qty 20

## 2019-10-06 MED ORDER — 0.9 % SODIUM CHLORIDE (POUR BTL) OPTIME
TOPICAL | Status: DC | PRN
  Administered 2019-10-06: 1000 mL

## 2019-10-06 MED ORDER — SODIUM CHLORIDE 0.9 % IV SOLN: INTRAVENOUS | Status: DC

## 2019-10-06 MED ORDER — LIDOCAINE-EPINEPHRINE (PF) 1 %-1:200000 IJ SOLN
INTRAMUSCULAR | Status: AC
  Filled 2019-10-06: qty 30

## 2019-10-06 MED ORDER — HYDROCODONE-ACETAMINOPHEN 5-325 MG PO TABS: 1.0000 | ORAL_TABLET | Freq: Four times a day (QID) | ORAL | 0 refills | Status: DC | PRN

## 2019-10-06 MED ORDER — VANCOMYCIN HCL IN DEXTROSE 1-5 GM/200ML-% IV SOLN
1000.0000 mg | INTRAVENOUS | Status: AC
  Administered 2019-10-06: 08:00:00 1000 mg via INTRAVENOUS

## 2019-10-06 MED ORDER — PHENYLEPHRINE 40 MCG/ML (10ML) SYRINGE FOR IV PUSH (FOR BLOOD PRESSURE SUPPORT)
PREFILLED_SYRINGE | INTRAVENOUS | Status: DC | PRN
  Administered 2019-10-06: 80 ug via INTRAVENOUS
  Administered 2019-10-06: 160 ug via INTRAVENOUS
  Administered 2019-10-06 (×2): 80 ug via INTRAVENOUS

## 2019-10-06 MED ORDER — CHLORHEXIDINE GLUCONATE 4 % EX LIQD: 60.0000 mL | Freq: Once | CUTANEOUS | Status: DC

## 2019-10-06 MED ORDER — VANCOMYCIN HCL IN DEXTROSE 1-5 GM/200ML-% IV SOLN
INTRAVENOUS | Status: AC
  Filled 2019-10-06: qty 200

## 2019-10-06 MED ORDER — FENTANYL CITRATE (PF) 250 MCG/5ML IJ SOLN
INTRAMUSCULAR | Status: AC
  Filled 2019-10-06: qty 5

## 2019-10-06 MED ORDER — LIDOCAINE 2% (20 MG/ML) 5 ML SYRINGE
INTRAMUSCULAR | Status: DC | PRN
  Administered 2019-10-06: 40 mg via INTRAVENOUS

## 2019-10-06 MED ORDER — SODIUM CHLORIDE 0.9 % IV SOLN
INTRAVENOUS | Status: DC | PRN
  Administered 2019-10-06: 08:00:00 500 mL

## 2019-10-06 MED ORDER — LIDOCAINE-EPINEPHRINE (PF) 1 %-1:200000 IJ SOLN
INTRAMUSCULAR | Status: DC | PRN
  Administered 2019-10-06: 14 mL

## 2019-10-06 MED ORDER — FENTANYL CITRATE (PF) 100 MCG/2ML IJ SOLN
INTRAMUSCULAR | Status: DC | PRN
  Administered 2019-10-06: 50 ug via INTRAVENOUS
  Administered 2019-10-06: 25 ug via INTRAVENOUS

## 2019-10-06 MED ORDER — SODIUM CHLORIDE 0.9 % IV SOLN
INTRAVENOUS | Status: DC | PRN
  Administered 2019-10-06: 07:00:00 via INTRAVENOUS

## 2019-10-06 MED ORDER — PHENYLEPHRINE 40 MCG/ML (10ML) SYRINGE FOR IV PUSH (FOR BLOOD PRESSURE SUPPORT)
PREFILLED_SYRINGE | INTRAVENOUS | Status: AC
  Filled 2019-10-06: qty 10

## 2019-10-06 MED ORDER — PROPOFOL 500 MG/50ML IV EMUL
INTRAVENOUS | Status: DC | PRN
  Administered 2019-10-06: 100 ug/kg/min via INTRAVENOUS

## 2019-10-06 MED ORDER — SODIUM CHLORIDE 0.9 % IV SOLN
INTRAVENOUS | Status: AC
  Filled 2019-10-06: qty 1.2

## 2019-10-06 SURGICAL SUPPLY — 34 items
ADH SKN CLS APL DERMABOND .7 (GAUZE/BANDAGES/DRESSINGS) ×1
ARMBAND PINK RESTRICT EXTREMIT (MISCELLANEOUS) ×3 IMPLANT
CANISTER SUCT 3000ML PPV (MISCELLANEOUS) ×3 IMPLANT
CLIP VESOCCLUDE MED 6/CT (CLIP) ×3 IMPLANT
CLIP VESOCCLUDE SM WIDE 6/CT (CLIP) ×3 IMPLANT
COVER PROBE W GEL 5X96 (DRAPES) ×3 IMPLANT
COVER WAND RF STERILE (DRAPES) ×3 IMPLANT
DERMABOND ADVANCED (GAUZE/BANDAGES/DRESSINGS) ×2
DERMABOND ADVANCED .7 DNX12 (GAUZE/BANDAGES/DRESSINGS) ×1 IMPLANT
ELECT REM PT RETURN 9FT ADLT (ELECTROSURGICAL) ×3
ELECTRODE REM PT RTRN 9FT ADLT (ELECTROSURGICAL) ×1 IMPLANT
GLOVE BIOGEL PI IND STRL 7.5 (GLOVE) ×1 IMPLANT
GLOVE BIOGEL PI INDICATOR 7.5 (GLOVE) ×2
GLOVE ECLIPSE 7.0 STRL STRAW (GLOVE) ×2 IMPLANT
GLOVE INDICATOR 7.5 STRL GRN (GLOVE) ×2 IMPLANT
GLOVE SURG SS PI 7.5 STRL IVOR (GLOVE) ×3 IMPLANT
GOWN STRL REUS W/ TWL LRG LVL3 (GOWN DISPOSABLE) ×2 IMPLANT
GOWN STRL REUS W/ TWL XL LVL3 (GOWN DISPOSABLE) ×1 IMPLANT
GOWN STRL REUS W/TWL LRG LVL3 (GOWN DISPOSABLE) ×6
GOWN STRL REUS W/TWL XL LVL3 (GOWN DISPOSABLE) ×3
HEMOSTAT SNOW SURGICEL 2X4 (HEMOSTASIS) IMPLANT
KIT BASIN OR (CUSTOM PROCEDURE TRAY) ×3 IMPLANT
KIT TURNOVER KIT B (KITS) ×3 IMPLANT
NS IRRIG 1000ML POUR BTL (IV SOLUTION) ×3 IMPLANT
PACK CV ACCESS (CUSTOM PROCEDURE TRAY) ×3 IMPLANT
PAD ARMBOARD 7.5X6 YLW CONV (MISCELLANEOUS) ×6 IMPLANT
SUT MNCRL AB 4-0 PS2 18 (SUTURE) ×2 IMPLANT
SUT PROLENE 6 0 CC (SUTURE) ×3 IMPLANT
SUT VIC AB 3-0 SH 27 (SUTURE) ×6
SUT VIC AB 3-0 SH 27X BRD (SUTURE) ×1 IMPLANT
SUT VICRYL 4-0 PS2 18IN ABS (SUTURE) IMPLANT
TOWEL GREEN STERILE (TOWEL DISPOSABLE) ×3 IMPLANT
UNDERPAD 30X30 (UNDERPADS AND DIAPERS) ×3 IMPLANT
WATER STERILE IRR 1000ML POUR (IV SOLUTION) ×3 IMPLANT

## 2019-10-06 NOTE — Transfer of Care (Signed)
Immediate Anesthesia Transfer of Care Note  Patient: Cassandra Baker  Procedure(s) Performed: ELEVATION OF ARTERIOVENOUS FISTULA LEFT ARM (Left Arm Upper)  Patient Location: PACU  Anesthesia Type:MAC  Level of Consciousness: awake, alert  and oriented  Airway & Oxygen Therapy: Patient Spontanous Breathing  Post-op Assessment: Report given to RN  Post vital signs: Reviewed and stable  Last Vitals:  Vitals Value Taken Time  BP 130/56 10/06/19 0845  Temp    Pulse 81 10/06/19 0846  Resp 15 10/06/19 0846  SpO2 96 % 10/06/19 0846  Vitals shown include unvalidated device data.  Last Pain:  Vitals:   10/06/19 0640  TempSrc:   PainSc: 0-No pain      Patients Stated Pain Goal: 3 (79/39/68 8648)  Complications: No apparent anesthesia complications

## 2019-10-06 NOTE — Op Note (Signed)
    Patient name: Cassandra Baker MRN: [id number] DOB: January 09, 1941 Sex: female  10/06/2019 Pre-operative Diagnosis: CKD 5 Post-operative diagnosis:  Same Surgeon:  [name] Assistants: Laurence Slate Procedure:   Elevation of left brachiocephalic fistula Anesthesia: MAC Blood Loss: Minimal Specimens: None  Findings: The fistula from the mid arm of the shoulder was elevated.  Fistula from the antecubital crease to the mid arm was at the appropriate depth by ultrasound.  Indications: Patient had left brachiocephalic fistula which has matured nicely however the upper half of the fistula is too deep for cannulation.  She comes in today for elevation  Procedure:  The patient was identified in the holding area and taken to [place]  The patient was then placed supine on the table. MAC anesthesia was administered.  The patient was prepped and draped in the usual sterile fashion.  A time out was called and antibiotics were administered.  Ultrasound was used to evaluate the fistula in the upper arm.  It was at the appropriate depth from the antecubital crease to the mid arm.  At this point it began to get deeper.  1% lidocaine was used for local anesthesia.  I made an incision beginning at the mid upper arm going up towards the shoulder.  Cautery is about subcutaneous tissue until I exposed the cephalic vein fistula.  This was encircled with vessel loop and then mobilized throughout the length of the incision.  Depth of the fistula is approximately 1.5 cm.  Once the fistula was fully mobilized, I closed the deep tissue posterior to the fistula with interrupted 3-0 Vicryl.  The skin was closed with 4 Monocryl and Dermabond.  There was a easily palpable thrill throughout the entire length of the fistula after the procedure.   Disposition: To PACU stable   V. [name], M.D., [facility] [place] Office: [phone] Pager:  [phone]

## 2019-10-06 NOTE — Discharge Instructions (Signed)
° °  [facility] [place] [facility] ° °Discharge Instructions ° °AV Fistula or Graft Surgery for Dialysis Access ° °Please refer to the following instructions for your post-procedure care. Your surgeon or physician assistant will discuss any changes with you. ° °Activity ° °You may drive the day following your surgery, if you are comfortable and no longer taking prescription pain medication. Resume full activity as the soreness in your incision resolves. ° °Bathing/Showering ° °You may shower after you go home. Keep your incision dry for 48 hours. Do not soak in a bathtub, hot tub, or swim until the incision heals completely. You may not shower if you have a hemodialysis catheter. ° °Incision Care ° °Clean your incision with mild soap and water after 48 hours. Pat the area dry with a clean towel. You do not need a bandage unless otherwise instructed. Do not apply any ointments or creams to your incision. You may have skin glue on your incision. Do not peel it off. It will come off on its own in about one week. Your arm may swell a bit after surgery. To reduce swelling use pillows to elevate your arm so it is above your heart. Your doctor will tell you if you need to lightly wrap your arm with an ACE bandage. ° °Diet ° °Resume your normal diet. There are not special food restrictions following this procedure. In order to heal from your surgery, it is CRITICAL to get adequate nutrition. Your body requires vitamins, minerals, and protein. Vegetables are the best source of vitamins and minerals. Vegetables also provide the perfect balance of protein. Processed food has little nutritional value, so try to avoid this. ° °Medications ° °Resume taking all of your medications. If your incision is causing pain, you may take over-the counter pain relievers such as acetaminophen (Tylenol). If you were prescribed a stronger pain medication, please be aware these medications can cause nausea and constipation. Prevent  nausea by taking the medication with a snack or meal. Avoid constipation by drinking plenty of fluids and eating foods with high amount of fiber, such as fruits, vegetables, and grains. Do not take Tylenol if you are taking prescription pain medications. ° ° ° ° °Follow up °Your surgeon may want to see you in the office following your access surgery. If so, this will be arranged at the time of your surgery. ° °Please call us immediately for any of the following conditions: ° °Increased pain, redness, drainage (pus) from your incision site °Fever of 101 degrees or higher °Severe or worsening pain at your incision site °Hand pain or numbness. ° °Reduce your risk of vascular disease: ° °Stop smoking. If you would like help, call [facility] at 1-800-QUIT-NOW (1-800-784-8669) or  at 336-586-4000 ° °Manage your cholesterol °Maintain a desired weight °Control your diabetes °Keep your blood pressure down ° °Dialysis ° °It will take several weeks to several months for your new dialysis access to be ready for use. Your surgeon will determine when it is OK to use it. Your nephrologist will continue to direct your dialysis. You can continue to use your Permcath until your new access is ready for use. ° °If you have any questions, please call the office at [phone]. ° °

## 2019-10-06 NOTE — Anesthesia Procedure Notes (Signed)
Procedure Name: MAC Date/Time: 10/06/2019 7:39 AM Performed by: Barrington Ellison, CRNA Pre-anesthesia Checklist: Patient identified, Emergency Drugs available, Suction available and Patient being monitored Patient Re-evaluated:Patient Re-evaluated prior to induction Oxygen Delivery Method: Simple face mask

## 2019-10-06 NOTE — Anesthesia Postprocedure Evaluation (Signed)
Anesthesia Post Note  Patient: Cassandra Baker  Procedure(s) Performed: ELEVATION OF ARTERIOVENOUS FISTULA LEFT ARM (Left Arm Upper)     Patient location during evaluation: PACU Anesthesia Type: MAC Level of consciousness: awake and alert Pain management: pain level controlled Vital Signs Assessment: post-procedure vital signs reviewed and stable Respiratory status: spontaneous breathing, nonlabored ventilation, respiratory function stable and patient connected to nasal cannula oxygen Cardiovascular status: stable and blood pressure returned to baseline Postop Assessment: no apparent nausea or vomiting Anesthetic complications: no    Last Vitals:  Vitals:   10/06/19 0945 10/06/19 1000  BP: (!) 142/63 131/65  Pulse: 68 73  Resp: 11 16  Temp:  36.5 C  SpO2: 99% 98%    Last Pain:  Vitals:   10/06/19 0945  TempSrc:   PainSc: 0-No pain                 Spencer Cardinal DAVID

## 2019-10-06 NOTE — Anesthesia Preprocedure Evaluation (Signed)
Anesthesia Evaluation  Patient identified by MRN, date of birth, ID band Patient awake    Reviewed: Allergy & Precautions, NPO status , Patient's Chart, lab work & pertinent test results  Airway Mallampati: II  TM Distance: >3 FB Neck ROM: Full    Dental   Pulmonary    Pulmonary exam normal        Cardiovascular hypertension, Pt. on medications Normal cardiovascular exam     Neuro/Psych Depression    GI/Hepatic   Endo/Other  diabetes, Type 2  Renal/GU Renal InsufficiencyRenal disease     Musculoskeletal   Abdominal   Peds  Hematology   Anesthesia Other Findings   Reproductive/Obstetrics                             Anesthesia Physical Anesthesia Plan  ASA: III  Anesthesia Plan: MAC   Post-op Pain Management:    Induction:   PONV Risk Score and Plan: 2 and Treatment may vary due to age or medical condition  Airway Management Planned: Simple Face Mask  Additional Equipment:   Intra-op Plan:   Post-operative Plan:   Informed Consent: I have reviewed the patients History and Physical, chart, labs and discussed the procedure including the risks, benefits and alternatives for the proposed anesthesia with the patient or authorized representative who has indicated his/her understanding and acceptance.       Plan Discussed with: CRNA and Surgeon  Anesthesia Plan Comments:         Anesthesia Quick Evaluation

## 2019-10-06 NOTE — Interval H&P Note (Signed)
History and Physical Interval Note:  10/06/2019 7:29 AM  Cassandra Baker  has presented today for surgery, with the diagnosis of COMPLICATION OF ARTERIOVENOUS FISTULA LEFT ARM.  The various methods of treatment have been discussed with the patient and family. After consideration of risks, benefits and other options for treatment, the patient has consented to  Procedure(s): FISTULA SUPERFICIALIZATION LEFT ARM (Left) as a surgical intervention.  The patient's history has been reviewed, patient examined, no change in status, stable for surgery.  I have reviewed the patient's chart and labs.  Questions were answered to the patient's satisfaction.     [name]

## 2019-10-07 ENCOUNTER — Encounter (HOSPITAL_COMMUNITY): Payer: Self-pay | Admitting: Surgery

## 2019-10-11 DIAGNOSIS — E1169 Type 2 diabetes mellitus with other specified complication: Secondary | ICD-10-CM | POA: Diagnosis not present

## 2019-10-11 DIAGNOSIS — Z0000 Encounter for general adult medical examination without abnormal findings: Secondary | ICD-10-CM | POA: Diagnosis not present

## 2019-10-11 DIAGNOSIS — F329 Major depressive disorder, single episode, unspecified: Secondary | ICD-10-CM | POA: Diagnosis not present

## 2019-10-11 DIAGNOSIS — N184 Chronic kidney disease, stage 4 (severe): Secondary | ICD-10-CM | POA: Diagnosis not present

## 2019-10-11 DIAGNOSIS — Z1389 Encounter for screening for other disorder: Secondary | ICD-10-CM | POA: Diagnosis not present

## 2019-10-11 DIAGNOSIS — E210 Primary hyperparathyroidism: Secondary | ICD-10-CM | POA: Diagnosis not present

## 2019-10-11 DIAGNOSIS — E7800 Pure hypercholesterolemia, unspecified: Secondary | ICD-10-CM | POA: Diagnosis not present

## 2019-10-11 DIAGNOSIS — Z23 Encounter for immunization: Secondary | ICD-10-CM | POA: Diagnosis not present

## 2019-10-11 DIAGNOSIS — I10 Essential (primary) hypertension: Secondary | ICD-10-CM | POA: Diagnosis not present

## 2019-10-17 DIAGNOSIS — E1122 Type 2 diabetes mellitus with diabetic chronic kidney disease: Secondary | ICD-10-CM | POA: Diagnosis not present

## 2019-10-17 DIAGNOSIS — M109 Gout, unspecified: Secondary | ICD-10-CM | POA: Diagnosis not present

## 2019-10-17 DIAGNOSIS — I5189 Other ill-defined heart diseases: Secondary | ICD-10-CM | POA: Diagnosis not present

## 2019-10-17 DIAGNOSIS — I120 Hypertensive chronic kidney disease with stage 5 chronic kidney disease or end stage renal disease: Secondary | ICD-10-CM | POA: Diagnosis not present

## 2019-10-17 DIAGNOSIS — E039 Hypothyroidism, unspecified: Secondary | ICD-10-CM | POA: Diagnosis not present

## 2019-10-17 DIAGNOSIS — E7800 Pure hypercholesterolemia, unspecified: Secondary | ICD-10-CM | POA: Diagnosis not present

## 2019-10-17 DIAGNOSIS — N189 Chronic kidney disease, unspecified: Secondary | ICD-10-CM | POA: Diagnosis not present

## 2019-10-17 DIAGNOSIS — N185 Chronic kidney disease, stage 5: Secondary | ICD-10-CM | POA: Diagnosis not present

## 2019-10-17 DIAGNOSIS — E210 Primary hyperparathyroidism: Secondary | ICD-10-CM | POA: Diagnosis not present

## 2019-10-24 ENCOUNTER — Other Ambulatory Visit: Payer: Self-pay

## 2019-10-24 ENCOUNTER — Ambulatory Visit (INDEPENDENT_AMBULATORY_CARE_PROVIDER_SITE_OTHER): Payer: Self-pay | Admitting: Physician Assistant

## 2019-10-24 DIAGNOSIS — N184 Chronic kidney disease, stage 4 (severe): Secondary | ICD-10-CM

## 2019-10-25 DIAGNOSIS — N184 Chronic kidney disease, stage 4 (severe): Secondary | ICD-10-CM | POA: Insufficient documentation

## 2019-10-25 NOTE — Progress Notes (Signed)
    Postoperative Access Visit   History of Present Illness   Cassandra Baker is a 78 y.o. year old female who presents for postoperative follow-up for: left brachiocephalic fistula superficialization  (Date: 10/06/19).  The patient's wounds are healed.  The patient denies steal symptoms.  The patient is able to complete their activities of daily living.  The patient is not yet on hemodialysis.  Physical Examination   Vitals:   10/24/19 1542  BP: (!) 146/62  Pulse: 90  Resp: 14  SpO2: 97%  Weight: 166 lb (75.3 kg)  Height: 5\' 4"  (1.626 m)   Body mass index is 28.49 kg/m.  left arm Incision is healed, hand grip is 5/5, sensation in digits is intact, palpable thrill, bruit can be auscultated, palpable L radial     Medical Decision Making   Cassandra Baker is a 78 y.o. year old female who presents s/p left arm fistula superficialization   Patent fistula without signs or symptoms of steal syndrome  The patient's access is ready for use  The patient may follow up on a prn basis   Cassandra Ligas PA-C [facility] Office: [phone]  Clinic MD: Dr. Trula Baker

## 2019-11-21 DIAGNOSIS — N1831 Chronic kidney disease, stage 3a: Secondary | ICD-10-CM | POA: Diagnosis not present

## 2019-11-21 DIAGNOSIS — E782 Mixed hyperlipidemia: Secondary | ICD-10-CM | POA: Diagnosis not present

## 2019-11-21 DIAGNOSIS — F329 Major depressive disorder, single episode, unspecified: Secondary | ICD-10-CM | POA: Diagnosis not present

## 2019-11-21 DIAGNOSIS — E1122 Type 2 diabetes mellitus with diabetic chronic kidney disease: Secondary | ICD-10-CM | POA: Diagnosis not present

## 2019-11-21 DIAGNOSIS — I10 Essential (primary) hypertension: Secondary | ICD-10-CM | POA: Diagnosis not present

## 2019-11-21 DIAGNOSIS — E7800 Pure hypercholesterolemia, unspecified: Secondary | ICD-10-CM | POA: Diagnosis not present

## 2019-11-21 DIAGNOSIS — E039 Hypothyroidism, unspecified: Secondary | ICD-10-CM | POA: Diagnosis not present

## 2019-11-21 DIAGNOSIS — N184 Chronic kidney disease, stage 4 (severe): Secondary | ICD-10-CM | POA: Diagnosis not present

## 2019-11-21 DIAGNOSIS — F3341 Major depressive disorder, recurrent, in partial remission: Secondary | ICD-10-CM | POA: Diagnosis not present

## 2019-11-21 DIAGNOSIS — E119 Type 2 diabetes mellitus without complications: Secondary | ICD-10-CM | POA: Diagnosis not present

## 2019-11-21 DIAGNOSIS — E1169 Type 2 diabetes mellitus with other specified complication: Secondary | ICD-10-CM | POA: Diagnosis not present

## 2019-11-22 DIAGNOSIS — N185 Chronic kidney disease, stage 5: Secondary | ICD-10-CM | POA: Diagnosis not present

## 2019-11-22 DIAGNOSIS — E210 Primary hyperparathyroidism: Secondary | ICD-10-CM | POA: Diagnosis not present

## 2019-11-22 DIAGNOSIS — I120 Hypertensive chronic kidney disease with stage 5 chronic kidney disease or end stage renal disease: Secondary | ICD-10-CM | POA: Diagnosis not present

## 2019-11-22 DIAGNOSIS — E1122 Type 2 diabetes mellitus with diabetic chronic kidney disease: Secondary | ICD-10-CM | POA: Diagnosis not present

## 2019-11-22 DIAGNOSIS — E039 Hypothyroidism, unspecified: Secondary | ICD-10-CM | POA: Diagnosis not present

## 2019-11-22 DIAGNOSIS — N189 Chronic kidney disease, unspecified: Secondary | ICD-10-CM | POA: Diagnosis not present

## 2019-11-22 DIAGNOSIS — I5189 Other ill-defined heart diseases: Secondary | ICD-10-CM | POA: Diagnosis not present

## 2019-11-22 DIAGNOSIS — E7800 Pure hypercholesterolemia, unspecified: Secondary | ICD-10-CM | POA: Diagnosis not present

## 2019-11-22 DIAGNOSIS — M109 Gout, unspecified: Secondary | ICD-10-CM | POA: Diagnosis not present

## 2019-12-08 DIAGNOSIS — N185 Chronic kidney disease, stage 5: Secondary | ICD-10-CM | POA: Diagnosis not present

## 2019-12-29 DIAGNOSIS — E890 Postprocedural hypothyroidism: Secondary | ICD-10-CM | POA: Diagnosis not present

## 2019-12-29 DIAGNOSIS — I10 Essential (primary) hypertension: Secondary | ICD-10-CM | POA: Diagnosis not present

## 2019-12-29 DIAGNOSIS — N189 Chronic kidney disease, unspecified: Secondary | ICD-10-CM | POA: Diagnosis not present

## 2019-12-29 DIAGNOSIS — M109 Gout, unspecified: Secondary | ICD-10-CM | POA: Diagnosis not present

## 2019-12-29 DIAGNOSIS — E7800 Pure hypercholesterolemia, unspecified: Secondary | ICD-10-CM | POA: Diagnosis not present

## 2019-12-29 DIAGNOSIS — E039 Hypothyroidism, unspecified: Secondary | ICD-10-CM | POA: Diagnosis not present

## 2019-12-29 DIAGNOSIS — I120 Hypertensive chronic kidney disease with stage 5 chronic kidney disease or end stage renal disease: Secondary | ICD-10-CM | POA: Diagnosis not present

## 2019-12-29 DIAGNOSIS — E210 Primary hyperparathyroidism: Secondary | ICD-10-CM | POA: Diagnosis not present

## 2019-12-29 DIAGNOSIS — E1165 Type 2 diabetes mellitus with hyperglycemia: Secondary | ICD-10-CM | POA: Diagnosis not present

## 2019-12-29 DIAGNOSIS — E1122 Type 2 diabetes mellitus with diabetic chronic kidney disease: Secondary | ICD-10-CM | POA: Diagnosis not present

## 2019-12-29 DIAGNOSIS — I5189 Other ill-defined heart diseases: Secondary | ICD-10-CM | POA: Diagnosis not present

## 2019-12-29 DIAGNOSIS — N185 Chronic kidney disease, stage 5: Secondary | ICD-10-CM | POA: Diagnosis not present

## 2020-01-12 DIAGNOSIS — N189 Chronic kidney disease, unspecified: Secondary | ICD-10-CM | POA: Diagnosis not present

## 2020-02-16 DIAGNOSIS — I5189 Other ill-defined heart diseases: Secondary | ICD-10-CM | POA: Diagnosis not present

## 2020-02-16 DIAGNOSIS — E7800 Pure hypercholesterolemia, unspecified: Secondary | ICD-10-CM | POA: Diagnosis not present

## 2020-02-16 DIAGNOSIS — M109 Gout, unspecified: Secondary | ICD-10-CM | POA: Diagnosis not present

## 2020-02-16 DIAGNOSIS — E039 Hypothyroidism, unspecified: Secondary | ICD-10-CM | POA: Diagnosis not present

## 2020-02-16 DIAGNOSIS — I120 Hypertensive chronic kidney disease with stage 5 chronic kidney disease or end stage renal disease: Secondary | ICD-10-CM | POA: Diagnosis not present

## 2020-02-16 DIAGNOSIS — N185 Chronic kidney disease, stage 5: Secondary | ICD-10-CM | POA: Diagnosis not present

## 2020-02-16 DIAGNOSIS — N189 Chronic kidney disease, unspecified: Secondary | ICD-10-CM | POA: Diagnosis not present

## 2020-02-16 DIAGNOSIS — E1122 Type 2 diabetes mellitus with diabetic chronic kidney disease: Secondary | ICD-10-CM | POA: Diagnosis not present

## 2020-02-16 DIAGNOSIS — E210 Primary hyperparathyroidism: Secondary | ICD-10-CM | POA: Diagnosis not present

## 2020-03-27 DIAGNOSIS — E039 Hypothyroidism, unspecified: Secondary | ICD-10-CM | POA: Diagnosis not present

## 2020-03-27 DIAGNOSIS — E210 Primary hyperparathyroidism: Secondary | ICD-10-CM | POA: Diagnosis not present

## 2020-03-27 DIAGNOSIS — E7800 Pure hypercholesterolemia, unspecified: Secondary | ICD-10-CM | POA: Diagnosis not present

## 2020-03-27 DIAGNOSIS — M109 Gout, unspecified: Secondary | ICD-10-CM | POA: Diagnosis not present

## 2020-03-27 DIAGNOSIS — I120 Hypertensive chronic kidney disease with stage 5 chronic kidney disease or end stage renal disease: Secondary | ICD-10-CM | POA: Diagnosis not present

## 2020-03-27 DIAGNOSIS — N185 Chronic kidney disease, stage 5: Secondary | ICD-10-CM | POA: Diagnosis not present

## 2020-03-27 DIAGNOSIS — E1122 Type 2 diabetes mellitus with diabetic chronic kidney disease: Secondary | ICD-10-CM | POA: Diagnosis not present

## 2020-03-27 DIAGNOSIS — I5189 Other ill-defined heart diseases: Secondary | ICD-10-CM | POA: Diagnosis not present

## 2020-03-27 DIAGNOSIS — N189 Chronic kidney disease, unspecified: Secondary | ICD-10-CM | POA: Diagnosis not present

## 2020-05-08 DIAGNOSIS — E210 Primary hyperparathyroidism: Secondary | ICD-10-CM | POA: Diagnosis not present

## 2020-05-08 DIAGNOSIS — E039 Hypothyroidism, unspecified: Secondary | ICD-10-CM | POA: Diagnosis not present

## 2020-05-08 DIAGNOSIS — N189 Chronic kidney disease, unspecified: Secondary | ICD-10-CM | POA: Diagnosis not present

## 2020-05-08 DIAGNOSIS — E1122 Type 2 diabetes mellitus with diabetic chronic kidney disease: Secondary | ICD-10-CM | POA: Diagnosis not present

## 2020-05-08 DIAGNOSIS — N185 Chronic kidney disease, stage 5: Secondary | ICD-10-CM | POA: Diagnosis not present

## 2020-05-08 DIAGNOSIS — M109 Gout, unspecified: Secondary | ICD-10-CM | POA: Diagnosis not present

## 2020-05-08 DIAGNOSIS — E7800 Pure hypercholesterolemia, unspecified: Secondary | ICD-10-CM | POA: Diagnosis not present

## 2020-05-08 DIAGNOSIS — I120 Hypertensive chronic kidney disease with stage 5 chronic kidney disease or end stage renal disease: Secondary | ICD-10-CM | POA: Diagnosis not present

## 2020-05-08 DIAGNOSIS — I5189 Other ill-defined heart diseases: Secondary | ICD-10-CM | POA: Diagnosis not present

## 2020-06-01 DIAGNOSIS — E782 Mixed hyperlipidemia: Secondary | ICD-10-CM | POA: Diagnosis not present

## 2020-06-01 DIAGNOSIS — N183 Chronic kidney disease, stage 3 unspecified: Secondary | ICD-10-CM | POA: Diagnosis not present

## 2020-06-01 DIAGNOSIS — E1169 Type 2 diabetes mellitus with other specified complication: Secondary | ICD-10-CM | POA: Diagnosis not present

## 2020-06-01 DIAGNOSIS — E7800 Pure hypercholesterolemia, unspecified: Secondary | ICD-10-CM | POA: Diagnosis not present

## 2020-06-01 DIAGNOSIS — E039 Hypothyroidism, unspecified: Secondary | ICD-10-CM | POA: Diagnosis not present

## 2020-06-01 DIAGNOSIS — N184 Chronic kidney disease, stage 4 (severe): Secondary | ICD-10-CM | POA: Diagnosis not present

## 2020-06-01 DIAGNOSIS — E119 Type 2 diabetes mellitus without complications: Secondary | ICD-10-CM | POA: Diagnosis not present

## 2020-06-01 DIAGNOSIS — I10 Essential (primary) hypertension: Secondary | ICD-10-CM | POA: Diagnosis not present

## 2020-06-01 DIAGNOSIS — E1122 Type 2 diabetes mellitus with diabetic chronic kidney disease: Secondary | ICD-10-CM | POA: Diagnosis not present

## 2020-06-01 DIAGNOSIS — F3341 Major depressive disorder, recurrent, in partial remission: Secondary | ICD-10-CM | POA: Diagnosis not present

## 2020-06-01 DIAGNOSIS — F329 Major depressive disorder, single episode, unspecified: Secondary | ICD-10-CM | POA: Diagnosis not present

## 2020-07-30 DIAGNOSIS — E210 Primary hyperparathyroidism: Secondary | ICD-10-CM | POA: Diagnosis not present

## 2020-07-30 DIAGNOSIS — E7800 Pure hypercholesterolemia, unspecified: Secondary | ICD-10-CM | POA: Diagnosis not present

## 2020-07-30 DIAGNOSIS — I120 Hypertensive chronic kidney disease with stage 5 chronic kidney disease or end stage renal disease: Secondary | ICD-10-CM | POA: Diagnosis not present

## 2020-07-30 DIAGNOSIS — N185 Chronic kidney disease, stage 5: Secondary | ICD-10-CM | POA: Diagnosis not present

## 2020-07-30 DIAGNOSIS — E1122 Type 2 diabetes mellitus with diabetic chronic kidney disease: Secondary | ICD-10-CM | POA: Diagnosis not present

## 2020-07-30 DIAGNOSIS — N189 Chronic kidney disease, unspecified: Secondary | ICD-10-CM | POA: Diagnosis not present

## 2020-07-30 DIAGNOSIS — M109 Gout, unspecified: Secondary | ICD-10-CM | POA: Diagnosis not present

## 2020-07-30 DIAGNOSIS — E039 Hypothyroidism, unspecified: Secondary | ICD-10-CM | POA: Diagnosis not present

## 2020-07-30 DIAGNOSIS — I5189 Other ill-defined heart diseases: Secondary | ICD-10-CM | POA: Diagnosis not present

## 2020-08-31 DIAGNOSIS — N184 Chronic kidney disease, stage 4 (severe): Secondary | ICD-10-CM | POA: Diagnosis not present

## 2020-08-31 DIAGNOSIS — E1169 Type 2 diabetes mellitus with other specified complication: Secondary | ICD-10-CM | POA: Diagnosis not present

## 2020-08-31 DIAGNOSIS — E1122 Type 2 diabetes mellitus with diabetic chronic kidney disease: Secondary | ICD-10-CM | POA: Diagnosis not present

## 2020-08-31 DIAGNOSIS — I5030 Unspecified diastolic (congestive) heart failure: Secondary | ICD-10-CM | POA: Diagnosis not present

## 2020-08-31 DIAGNOSIS — F329 Major depressive disorder, single episode, unspecified: Secondary | ICD-10-CM | POA: Diagnosis not present

## 2020-08-31 DIAGNOSIS — E119 Type 2 diabetes mellitus without complications: Secondary | ICD-10-CM | POA: Diagnosis not present

## 2020-08-31 DIAGNOSIS — E039 Hypothyroidism, unspecified: Secondary | ICD-10-CM | POA: Diagnosis not present

## 2020-08-31 DIAGNOSIS — N183 Chronic kidney disease, stage 3 unspecified: Secondary | ICD-10-CM | POA: Diagnosis not present

## 2020-08-31 DIAGNOSIS — I10 Essential (primary) hypertension: Secondary | ICD-10-CM | POA: Diagnosis not present

## 2020-08-31 DIAGNOSIS — E782 Mixed hyperlipidemia: Secondary | ICD-10-CM | POA: Diagnosis not present

## 2020-08-31 DIAGNOSIS — F3341 Major depressive disorder, recurrent, in partial remission: Secondary | ICD-10-CM | POA: Diagnosis not present

## 2020-08-31 DIAGNOSIS — E7800 Pure hypercholesterolemia, unspecified: Secondary | ICD-10-CM | POA: Diagnosis not present

## 2020-09-18 DIAGNOSIS — M109 Gout, unspecified: Secondary | ICD-10-CM | POA: Diagnosis not present

## 2020-09-18 DIAGNOSIS — E7800 Pure hypercholesterolemia, unspecified: Secondary | ICD-10-CM | POA: Diagnosis not present

## 2020-09-18 DIAGNOSIS — N185 Chronic kidney disease, stage 5: Secondary | ICD-10-CM | POA: Diagnosis not present

## 2020-09-18 DIAGNOSIS — E210 Primary hyperparathyroidism: Secondary | ICD-10-CM | POA: Diagnosis not present

## 2020-09-18 DIAGNOSIS — N189 Chronic kidney disease, unspecified: Secondary | ICD-10-CM | POA: Diagnosis not present

## 2020-09-18 DIAGNOSIS — I5189 Other ill-defined heart diseases: Secondary | ICD-10-CM | POA: Diagnosis not present

## 2020-09-18 DIAGNOSIS — I120 Hypertensive chronic kidney disease with stage 5 chronic kidney disease or end stage renal disease: Secondary | ICD-10-CM | POA: Diagnosis not present

## 2020-09-18 DIAGNOSIS — E039 Hypothyroidism, unspecified: Secondary | ICD-10-CM | POA: Diagnosis not present

## 2020-09-18 DIAGNOSIS — E1122 Type 2 diabetes mellitus with diabetic chronic kidney disease: Secondary | ICD-10-CM | POA: Diagnosis not present

## 2020-10-11 DIAGNOSIS — N185 Chronic kidney disease, stage 5: Secondary | ICD-10-CM | POA: Diagnosis not present

## 2020-10-19 DIAGNOSIS — Z23 Encounter for immunization: Secondary | ICD-10-CM | POA: Diagnosis not present

## 2020-10-29 DIAGNOSIS — Z7984 Long term (current) use of oral hypoglycemic drugs: Secondary | ICD-10-CM | POA: Diagnosis not present

## 2020-10-29 DIAGNOSIS — E210 Primary hyperparathyroidism: Secondary | ICD-10-CM | POA: Diagnosis not present

## 2020-10-29 DIAGNOSIS — I5030 Unspecified diastolic (congestive) heart failure: Secondary | ICD-10-CM | POA: Diagnosis not present

## 2020-10-29 DIAGNOSIS — Z23 Encounter for immunization: Secondary | ICD-10-CM | POA: Diagnosis not present

## 2020-10-29 DIAGNOSIS — E039 Hypothyroidism, unspecified: Secondary | ICD-10-CM | POA: Diagnosis not present

## 2020-10-29 DIAGNOSIS — N185 Chronic kidney disease, stage 5: Secondary | ICD-10-CM | POA: Diagnosis not present

## 2020-10-29 DIAGNOSIS — M109 Gout, unspecified: Secondary | ICD-10-CM | POA: Diagnosis not present

## 2020-10-29 DIAGNOSIS — I10 Essential (primary) hypertension: Secondary | ICD-10-CM | POA: Diagnosis not present

## 2020-10-29 DIAGNOSIS — E7800 Pure hypercholesterolemia, unspecified: Secondary | ICD-10-CM | POA: Diagnosis not present

## 2020-10-29 DIAGNOSIS — F3341 Major depressive disorder, recurrent, in partial remission: Secondary | ICD-10-CM | POA: Diagnosis not present

## 2020-10-29 DIAGNOSIS — Z0000 Encounter for general adult medical examination without abnormal findings: Secondary | ICD-10-CM | POA: Diagnosis not present

## 2020-10-29 DIAGNOSIS — E1122 Type 2 diabetes mellitus with diabetic chronic kidney disease: Secondary | ICD-10-CM | POA: Diagnosis not present

## 2020-11-12 DIAGNOSIS — E210 Primary hyperparathyroidism: Secondary | ICD-10-CM | POA: Diagnosis not present

## 2020-11-12 DIAGNOSIS — N185 Chronic kidney disease, stage 5: Secondary | ICD-10-CM | POA: Diagnosis not present

## 2020-11-12 DIAGNOSIS — E7800 Pure hypercholesterolemia, unspecified: Secondary | ICD-10-CM | POA: Diagnosis not present

## 2020-11-12 DIAGNOSIS — E1129 Type 2 diabetes mellitus with other diabetic kidney complication: Secondary | ICD-10-CM | POA: Diagnosis not present

## 2020-11-12 DIAGNOSIS — M109 Gout, unspecified: Secondary | ICD-10-CM | POA: Diagnosis not present

## 2020-11-12 DIAGNOSIS — I5189 Other ill-defined heart diseases: Secondary | ICD-10-CM | POA: Diagnosis not present

## 2020-11-12 DIAGNOSIS — N189 Chronic kidney disease, unspecified: Secondary | ICD-10-CM | POA: Diagnosis not present

## 2020-11-12 DIAGNOSIS — E039 Hypothyroidism, unspecified: Secondary | ICD-10-CM | POA: Diagnosis not present

## 2020-11-12 DIAGNOSIS — E1122 Type 2 diabetes mellitus with diabetic chronic kidney disease: Secondary | ICD-10-CM | POA: Diagnosis not present

## 2020-11-12 DIAGNOSIS — I120 Hypertensive chronic kidney disease with stage 5 chronic kidney disease or end stage renal disease: Secondary | ICD-10-CM | POA: Diagnosis not present

## 2020-12-25 DIAGNOSIS — E1165 Type 2 diabetes mellitus with hyperglycemia: Secondary | ICD-10-CM | POA: Diagnosis not present

## 2020-12-31 DIAGNOSIS — E890 Postprocedural hypothyroidism: Secondary | ICD-10-CM | POA: Diagnosis not present

## 2021-01-01 DIAGNOSIS — E1165 Type 2 diabetes mellitus with hyperglycemia: Secondary | ICD-10-CM | POA: Diagnosis not present

## 2021-01-01 DIAGNOSIS — N189 Chronic kidney disease, unspecified: Secondary | ICD-10-CM | POA: Diagnosis not present

## 2021-01-01 DIAGNOSIS — I10 Essential (primary) hypertension: Secondary | ICD-10-CM | POA: Diagnosis not present

## 2021-01-01 DIAGNOSIS — E890 Postprocedural hypothyroidism: Secondary | ICD-10-CM | POA: Diagnosis not present

## 2021-01-16 DIAGNOSIS — E1169 Type 2 diabetes mellitus with other specified complication: Secondary | ICD-10-CM | POA: Diagnosis not present

## 2021-01-16 DIAGNOSIS — I5030 Unspecified diastolic (congestive) heart failure: Secondary | ICD-10-CM | POA: Diagnosis not present

## 2021-01-16 DIAGNOSIS — I10 Essential (primary) hypertension: Secondary | ICD-10-CM | POA: Diagnosis not present

## 2021-01-16 DIAGNOSIS — E039 Hypothyroidism, unspecified: Secondary | ICD-10-CM | POA: Diagnosis not present

## 2021-01-16 DIAGNOSIS — N185 Chronic kidney disease, stage 5: Secondary | ICD-10-CM | POA: Diagnosis not present

## 2021-01-16 DIAGNOSIS — F3341 Major depressive disorder, recurrent, in partial remission: Secondary | ICD-10-CM | POA: Diagnosis not present

## 2021-01-16 DIAGNOSIS — E782 Mixed hyperlipidemia: Secondary | ICD-10-CM | POA: Diagnosis not present

## 2021-01-16 DIAGNOSIS — E7800 Pure hypercholesterolemia, unspecified: Secondary | ICD-10-CM | POA: Diagnosis not present

## 2021-02-25 DIAGNOSIS — N189 Chronic kidney disease, unspecified: Secondary | ICD-10-CM | POA: Diagnosis not present

## 2021-02-25 DIAGNOSIS — E1129 Type 2 diabetes mellitus with other diabetic kidney complication: Secondary | ICD-10-CM | POA: Diagnosis not present

## 2021-02-25 DIAGNOSIS — M109 Gout, unspecified: Secondary | ICD-10-CM | POA: Diagnosis not present

## 2021-02-25 DIAGNOSIS — I120 Hypertensive chronic kidney disease with stage 5 chronic kidney disease or end stage renal disease: Secondary | ICD-10-CM | POA: Diagnosis not present

## 2021-02-25 DIAGNOSIS — E1122 Type 2 diabetes mellitus with diabetic chronic kidney disease: Secondary | ICD-10-CM | POA: Diagnosis not present

## 2021-02-25 DIAGNOSIS — N185 Chronic kidney disease, stage 5: Secondary | ICD-10-CM | POA: Diagnosis not present

## 2021-02-25 DIAGNOSIS — I5189 Other ill-defined heart diseases: Secondary | ICD-10-CM | POA: Diagnosis not present

## 2021-02-25 DIAGNOSIS — E7800 Pure hypercholesterolemia, unspecified: Secondary | ICD-10-CM | POA: Diagnosis not present

## 2021-02-25 DIAGNOSIS — E039 Hypothyroidism, unspecified: Secondary | ICD-10-CM | POA: Diagnosis not present

## 2021-02-25 DIAGNOSIS — E210 Primary hyperparathyroidism: Secondary | ICD-10-CM | POA: Diagnosis not present

## 2021-03-20 DIAGNOSIS — E782 Mixed hyperlipidemia: Secondary | ICD-10-CM | POA: Diagnosis not present

## 2021-03-20 DIAGNOSIS — E039 Hypothyroidism, unspecified: Secondary | ICD-10-CM | POA: Diagnosis not present

## 2021-03-20 DIAGNOSIS — E7800 Pure hypercholesterolemia, unspecified: Secondary | ICD-10-CM | POA: Diagnosis not present

## 2021-03-20 DIAGNOSIS — E119 Type 2 diabetes mellitus without complications: Secondary | ICD-10-CM | POA: Diagnosis not present

## 2021-03-20 DIAGNOSIS — N185 Chronic kidney disease, stage 5: Secondary | ICD-10-CM | POA: Diagnosis not present

## 2021-03-20 DIAGNOSIS — E1169 Type 2 diabetes mellitus with other specified complication: Secondary | ICD-10-CM | POA: Diagnosis not present

## 2021-03-20 DIAGNOSIS — N184 Chronic kidney disease, stage 4 (severe): Secondary | ICD-10-CM | POA: Diagnosis not present

## 2021-03-20 DIAGNOSIS — F329 Major depressive disorder, single episode, unspecified: Secondary | ICD-10-CM | POA: Diagnosis not present

## 2021-03-20 DIAGNOSIS — E1122 Type 2 diabetes mellitus with diabetic chronic kidney disease: Secondary | ICD-10-CM | POA: Diagnosis not present

## 2021-03-20 DIAGNOSIS — I10 Essential (primary) hypertension: Secondary | ICD-10-CM | POA: Diagnosis not present

## 2021-03-20 DIAGNOSIS — I5030 Unspecified diastolic (congestive) heart failure: Secondary | ICD-10-CM | POA: Diagnosis not present

## 2021-04-15 DIAGNOSIS — I5189 Other ill-defined heart diseases: Secondary | ICD-10-CM | POA: Diagnosis not present

## 2021-04-15 DIAGNOSIS — E039 Hypothyroidism, unspecified: Secondary | ICD-10-CM | POA: Diagnosis not present

## 2021-04-15 DIAGNOSIS — E1122 Type 2 diabetes mellitus with diabetic chronic kidney disease: Secondary | ICD-10-CM | POA: Diagnosis not present

## 2021-04-15 DIAGNOSIS — N185 Chronic kidney disease, stage 5: Secondary | ICD-10-CM | POA: Diagnosis not present

## 2021-04-15 DIAGNOSIS — N189 Chronic kidney disease, unspecified: Secondary | ICD-10-CM | POA: Diagnosis not present

## 2021-04-15 DIAGNOSIS — E1129 Type 2 diabetes mellitus with other diabetic kidney complication: Secondary | ICD-10-CM | POA: Diagnosis not present

## 2021-04-15 DIAGNOSIS — E7800 Pure hypercholesterolemia, unspecified: Secondary | ICD-10-CM | POA: Diagnosis not present

## 2021-04-15 DIAGNOSIS — M109 Gout, unspecified: Secondary | ICD-10-CM | POA: Diagnosis not present

## 2021-04-15 DIAGNOSIS — I120 Hypertensive chronic kidney disease with stage 5 chronic kidney disease or end stage renal disease: Secondary | ICD-10-CM | POA: Diagnosis not present

## 2021-04-15 DIAGNOSIS — E210 Primary hyperparathyroidism: Secondary | ICD-10-CM | POA: Diagnosis not present

## 2021-05-17 DIAGNOSIS — E1122 Type 2 diabetes mellitus with diabetic chronic kidney disease: Secondary | ICD-10-CM | POA: Diagnosis not present

## 2021-05-17 DIAGNOSIS — N184 Chronic kidney disease, stage 4 (severe): Secondary | ICD-10-CM | POA: Diagnosis not present

## 2021-05-17 DIAGNOSIS — F3341 Major depressive disorder, recurrent, in partial remission: Secondary | ICD-10-CM | POA: Diagnosis not present

## 2021-05-17 DIAGNOSIS — I10 Essential (primary) hypertension: Secondary | ICD-10-CM | POA: Diagnosis not present

## 2021-05-17 DIAGNOSIS — N183 Chronic kidney disease, stage 3 unspecified: Secondary | ICD-10-CM | POA: Diagnosis not present

## 2021-05-17 DIAGNOSIS — I5030 Unspecified diastolic (congestive) heart failure: Secondary | ICD-10-CM | POA: Diagnosis not present

## 2021-05-17 DIAGNOSIS — F329 Major depressive disorder, single episode, unspecified: Secondary | ICD-10-CM | POA: Diagnosis not present

## 2021-05-17 DIAGNOSIS — E119 Type 2 diabetes mellitus without complications: Secondary | ICD-10-CM | POA: Diagnosis not present

## 2021-05-17 DIAGNOSIS — E782 Mixed hyperlipidemia: Secondary | ICD-10-CM | POA: Diagnosis not present

## 2021-05-17 DIAGNOSIS — E7800 Pure hypercholesterolemia, unspecified: Secondary | ICD-10-CM | POA: Diagnosis not present

## 2021-05-17 DIAGNOSIS — N185 Chronic kidney disease, stage 5: Secondary | ICD-10-CM | POA: Diagnosis not present

## 2021-05-17 DIAGNOSIS — E039 Hypothyroidism, unspecified: Secondary | ICD-10-CM | POA: Diagnosis not present

## 2021-06-05 DIAGNOSIS — I120 Hypertensive chronic kidney disease with stage 5 chronic kidney disease or end stage renal disease: Secondary | ICD-10-CM | POA: Diagnosis not present

## 2021-06-05 DIAGNOSIS — M109 Gout, unspecified: Secondary | ICD-10-CM | POA: Diagnosis not present

## 2021-06-05 DIAGNOSIS — N185 Chronic kidney disease, stage 5: Secondary | ICD-10-CM | POA: Diagnosis not present

## 2021-06-05 DIAGNOSIS — E7800 Pure hypercholesterolemia, unspecified: Secondary | ICD-10-CM | POA: Diagnosis not present

## 2021-06-05 DIAGNOSIS — N189 Chronic kidney disease, unspecified: Secondary | ICD-10-CM | POA: Diagnosis not present

## 2021-06-05 DIAGNOSIS — E039 Hypothyroidism, unspecified: Secondary | ICD-10-CM | POA: Diagnosis not present

## 2021-06-05 DIAGNOSIS — I5189 Other ill-defined heart diseases: Secondary | ICD-10-CM | POA: Diagnosis not present

## 2021-06-05 DIAGNOSIS — E1129 Type 2 diabetes mellitus with other diabetic kidney complication: Secondary | ICD-10-CM | POA: Diagnosis not present

## 2021-06-05 DIAGNOSIS — E210 Primary hyperparathyroidism: Secondary | ICD-10-CM | POA: Diagnosis not present

## 2021-06-05 DIAGNOSIS — E1122 Type 2 diabetes mellitus with diabetic chronic kidney disease: Secondary | ICD-10-CM | POA: Diagnosis not present

## 2021-07-01 DIAGNOSIS — I10 Essential (primary) hypertension: Secondary | ICD-10-CM | POA: Diagnosis not present

## 2021-07-01 DIAGNOSIS — N189 Chronic kidney disease, unspecified: Secondary | ICD-10-CM | POA: Diagnosis not present

## 2021-07-01 DIAGNOSIS — E1165 Type 2 diabetes mellitus with hyperglycemia: Secondary | ICD-10-CM | POA: Diagnosis not present

## 2021-07-01 DIAGNOSIS — E890 Postprocedural hypothyroidism: Secondary | ICD-10-CM | POA: Diagnosis not present

## 2021-07-05 DIAGNOSIS — I10 Essential (primary) hypertension: Secondary | ICD-10-CM | POA: Diagnosis not present

## 2021-07-05 DIAGNOSIS — E1169 Type 2 diabetes mellitus with other specified complication: Secondary | ICD-10-CM | POA: Diagnosis not present

## 2021-07-05 DIAGNOSIS — E039 Hypothyroidism, unspecified: Secondary | ICD-10-CM | POA: Diagnosis not present

## 2021-07-05 DIAGNOSIS — E1122 Type 2 diabetes mellitus with diabetic chronic kidney disease: Secondary | ICD-10-CM | POA: Diagnosis not present

## 2021-07-05 DIAGNOSIS — E782 Mixed hyperlipidemia: Secondary | ICD-10-CM | POA: Diagnosis not present

## 2021-07-05 DIAGNOSIS — I5030 Unspecified diastolic (congestive) heart failure: Secondary | ICD-10-CM | POA: Diagnosis not present

## 2021-07-05 DIAGNOSIS — E7800 Pure hypercholesterolemia, unspecified: Secondary | ICD-10-CM | POA: Diagnosis not present

## 2021-07-05 DIAGNOSIS — N185 Chronic kidney disease, stage 5: Secondary | ICD-10-CM | POA: Diagnosis not present

## 2021-07-05 DIAGNOSIS — F3341 Major depressive disorder, recurrent, in partial remission: Secondary | ICD-10-CM | POA: Diagnosis not present

## 2021-07-22 DIAGNOSIS — E039 Hypothyroidism, unspecified: Secondary | ICD-10-CM | POA: Diagnosis not present

## 2021-07-22 DIAGNOSIS — E7800 Pure hypercholesterolemia, unspecified: Secondary | ICD-10-CM | POA: Diagnosis not present

## 2021-07-22 DIAGNOSIS — E1129 Type 2 diabetes mellitus with other diabetic kidney complication: Secondary | ICD-10-CM | POA: Diagnosis not present

## 2021-07-22 DIAGNOSIS — I120 Hypertensive chronic kidney disease with stage 5 chronic kidney disease or end stage renal disease: Secondary | ICD-10-CM | POA: Diagnosis not present

## 2021-07-22 DIAGNOSIS — N189 Chronic kidney disease, unspecified: Secondary | ICD-10-CM | POA: Diagnosis not present

## 2021-07-22 DIAGNOSIS — E1122 Type 2 diabetes mellitus with diabetic chronic kidney disease: Secondary | ICD-10-CM | POA: Diagnosis not present

## 2021-07-22 DIAGNOSIS — I5189 Other ill-defined heart diseases: Secondary | ICD-10-CM | POA: Diagnosis not present

## 2021-07-22 DIAGNOSIS — N185 Chronic kidney disease, stage 5: Secondary | ICD-10-CM | POA: Diagnosis not present

## 2021-07-22 DIAGNOSIS — M109 Gout, unspecified: Secondary | ICD-10-CM | POA: Diagnosis not present

## 2021-07-22 DIAGNOSIS — E210 Primary hyperparathyroidism: Secondary | ICD-10-CM | POA: Diagnosis not present

## 2021-09-15 DIAGNOSIS — E039 Hypothyroidism, unspecified: Secondary | ICD-10-CM | POA: Diagnosis not present

## 2021-09-15 DIAGNOSIS — F3341 Major depressive disorder, recurrent, in partial remission: Secondary | ICD-10-CM | POA: Diagnosis not present

## 2021-09-15 DIAGNOSIS — E1122 Type 2 diabetes mellitus with diabetic chronic kidney disease: Secondary | ICD-10-CM | POA: Diagnosis not present

## 2021-09-15 DIAGNOSIS — E782 Mixed hyperlipidemia: Secondary | ICD-10-CM | POA: Diagnosis not present

## 2021-09-15 DIAGNOSIS — I5030 Unspecified diastolic (congestive) heart failure: Secondary | ICD-10-CM | POA: Diagnosis not present

## 2021-09-15 DIAGNOSIS — E1169 Type 2 diabetes mellitus with other specified complication: Secondary | ICD-10-CM | POA: Diagnosis not present

## 2021-09-15 DIAGNOSIS — I10 Essential (primary) hypertension: Secondary | ICD-10-CM | POA: Diagnosis not present

## 2021-09-15 DIAGNOSIS — N185 Chronic kidney disease, stage 5: Secondary | ICD-10-CM | POA: Diagnosis not present

## 2021-09-15 DIAGNOSIS — E7800 Pure hypercholesterolemia, unspecified: Secondary | ICD-10-CM | POA: Diagnosis not present

## 2021-09-19 DIAGNOSIS — E7800 Pure hypercholesterolemia, unspecified: Secondary | ICD-10-CM | POA: Diagnosis not present

## 2021-09-19 DIAGNOSIS — E210 Primary hyperparathyroidism: Secondary | ICD-10-CM | POA: Diagnosis not present

## 2021-09-19 DIAGNOSIS — I120 Hypertensive chronic kidney disease with stage 5 chronic kidney disease or end stage renal disease: Secondary | ICD-10-CM | POA: Diagnosis not present

## 2021-09-19 DIAGNOSIS — E1122 Type 2 diabetes mellitus with diabetic chronic kidney disease: Secondary | ICD-10-CM | POA: Diagnosis not present

## 2021-09-19 DIAGNOSIS — N185 Chronic kidney disease, stage 5: Secondary | ICD-10-CM | POA: Diagnosis not present

## 2021-09-19 DIAGNOSIS — I5189 Other ill-defined heart diseases: Secondary | ICD-10-CM | POA: Diagnosis not present

## 2021-09-19 DIAGNOSIS — E039 Hypothyroidism, unspecified: Secondary | ICD-10-CM | POA: Diagnosis not present

## 2021-09-19 DIAGNOSIS — M109 Gout, unspecified: Secondary | ICD-10-CM | POA: Diagnosis not present

## 2021-09-19 DIAGNOSIS — E1129 Type 2 diabetes mellitus with other diabetic kidney complication: Secondary | ICD-10-CM | POA: Diagnosis not present

## 2021-09-19 DIAGNOSIS — N189 Chronic kidney disease, unspecified: Secondary | ICD-10-CM | POA: Diagnosis not present

## 2021-10-10 DIAGNOSIS — E039 Hypothyroidism, unspecified: Secondary | ICD-10-CM | POA: Diagnosis not present

## 2021-10-10 DIAGNOSIS — E782 Mixed hyperlipidemia: Secondary | ICD-10-CM | POA: Diagnosis not present

## 2021-10-10 DIAGNOSIS — N185 Chronic kidney disease, stage 5: Secondary | ICD-10-CM | POA: Diagnosis not present

## 2021-10-10 DIAGNOSIS — E1122 Type 2 diabetes mellitus with diabetic chronic kidney disease: Secondary | ICD-10-CM | POA: Diagnosis not present

## 2021-10-10 DIAGNOSIS — I10 Essential (primary) hypertension: Secondary | ICD-10-CM | POA: Diagnosis not present

## 2021-10-10 DIAGNOSIS — E7800 Pure hypercholesterolemia, unspecified: Secondary | ICD-10-CM | POA: Diagnosis not present

## 2021-10-10 DIAGNOSIS — E1169 Type 2 diabetes mellitus with other specified complication: Secondary | ICD-10-CM | POA: Diagnosis not present

## 2021-10-10 DIAGNOSIS — F3341 Major depressive disorder, recurrent, in partial remission: Secondary | ICD-10-CM | POA: Diagnosis not present

## 2021-10-10 DIAGNOSIS — I5030 Unspecified diastolic (congestive) heart failure: Secondary | ICD-10-CM | POA: Diagnosis not present

## 2021-11-13 DIAGNOSIS — E7800 Pure hypercholesterolemia, unspecified: Secondary | ICD-10-CM | POA: Diagnosis not present

## 2021-11-13 DIAGNOSIS — N189 Chronic kidney disease, unspecified: Secondary | ICD-10-CM | POA: Diagnosis not present

## 2021-11-13 DIAGNOSIS — N185 Chronic kidney disease, stage 5: Secondary | ICD-10-CM | POA: Diagnosis not present

## 2021-11-13 DIAGNOSIS — I5189 Other ill-defined heart diseases: Secondary | ICD-10-CM | POA: Diagnosis not present

## 2021-11-13 DIAGNOSIS — I120 Hypertensive chronic kidney disease with stage 5 chronic kidney disease or end stage renal disease: Secondary | ICD-10-CM | POA: Diagnosis not present

## 2021-11-13 DIAGNOSIS — E210 Primary hyperparathyroidism: Secondary | ICD-10-CM | POA: Diagnosis not present

## 2021-11-13 DIAGNOSIS — E039 Hypothyroidism, unspecified: Secondary | ICD-10-CM | POA: Diagnosis not present

## 2021-11-13 DIAGNOSIS — E1122 Type 2 diabetes mellitus with diabetic chronic kidney disease: Secondary | ICD-10-CM | POA: Diagnosis not present

## 2021-11-13 DIAGNOSIS — E1129 Type 2 diabetes mellitus with other diabetic kidney complication: Secondary | ICD-10-CM | POA: Diagnosis not present

## 2021-11-13 DIAGNOSIS — M109 Gout, unspecified: Secondary | ICD-10-CM | POA: Diagnosis not present

## 2021-11-19 DIAGNOSIS — N189 Chronic kidney disease, unspecified: Secondary | ICD-10-CM | POA: Diagnosis not present

## 2021-11-27 DIAGNOSIS — Z23 Encounter for immunization: Secondary | ICD-10-CM | POA: Diagnosis not present

## 2021-12-24 DIAGNOSIS — E1165 Type 2 diabetes mellitus with hyperglycemia: Secondary | ICD-10-CM | POA: Diagnosis not present

## 2021-12-24 DIAGNOSIS — E890 Postprocedural hypothyroidism: Secondary | ICD-10-CM | POA: Diagnosis not present

## 2021-12-31 DIAGNOSIS — E1165 Type 2 diabetes mellitus with hyperglycemia: Secondary | ICD-10-CM | POA: Diagnosis not present

## 2021-12-31 DIAGNOSIS — I10 Essential (primary) hypertension: Secondary | ICD-10-CM | POA: Diagnosis not present

## 2021-12-31 DIAGNOSIS — N189 Chronic kidney disease, unspecified: Secondary | ICD-10-CM | POA: Diagnosis not present

## 2021-12-31 DIAGNOSIS — E890 Postprocedural hypothyroidism: Secondary | ICD-10-CM | POA: Diagnosis not present

## 2021-12-31 DIAGNOSIS — E7800 Pure hypercholesterolemia, unspecified: Secondary | ICD-10-CM | POA: Diagnosis not present

## 2022-01-06 DIAGNOSIS — M109 Gout, unspecified: Secondary | ICD-10-CM | POA: Diagnosis not present

## 2022-01-06 DIAGNOSIS — E210 Primary hyperparathyroidism: Secondary | ICD-10-CM | POA: Diagnosis not present

## 2022-01-06 DIAGNOSIS — N185 Chronic kidney disease, stage 5: Secondary | ICD-10-CM | POA: Diagnosis not present

## 2022-01-06 DIAGNOSIS — I120 Hypertensive chronic kidney disease with stage 5 chronic kidney disease or end stage renal disease: Secondary | ICD-10-CM | POA: Diagnosis not present

## 2022-01-06 DIAGNOSIS — E039 Hypothyroidism, unspecified: Secondary | ICD-10-CM | POA: Diagnosis not present

## 2022-01-06 DIAGNOSIS — I5189 Other ill-defined heart diseases: Secondary | ICD-10-CM | POA: Diagnosis not present

## 2022-01-06 DIAGNOSIS — E1129 Type 2 diabetes mellitus with other diabetic kidney complication: Secondary | ICD-10-CM | POA: Diagnosis not present

## 2022-01-06 DIAGNOSIS — E1122 Type 2 diabetes mellitus with diabetic chronic kidney disease: Secondary | ICD-10-CM | POA: Diagnosis not present

## 2022-01-06 DIAGNOSIS — E7800 Pure hypercholesterolemia, unspecified: Secondary | ICD-10-CM | POA: Diagnosis not present

## 2022-01-06 DIAGNOSIS — N189 Chronic kidney disease, unspecified: Secondary | ICD-10-CM | POA: Diagnosis not present

## 2022-01-14 DIAGNOSIS — E039 Hypothyroidism, unspecified: Secondary | ICD-10-CM | POA: Diagnosis not present

## 2022-01-14 DIAGNOSIS — Z0000 Encounter for general adult medical examination without abnormal findings: Secondary | ICD-10-CM | POA: Diagnosis not present

## 2022-01-14 DIAGNOSIS — I10 Essential (primary) hypertension: Secondary | ICD-10-CM | POA: Diagnosis not present

## 2022-01-14 DIAGNOSIS — N185 Chronic kidney disease, stage 5: Secondary | ICD-10-CM | POA: Diagnosis not present

## 2022-01-14 DIAGNOSIS — E1122 Type 2 diabetes mellitus with diabetic chronic kidney disease: Secondary | ICD-10-CM | POA: Diagnosis not present

## 2022-01-14 DIAGNOSIS — I5030 Unspecified diastolic (congestive) heart failure: Secondary | ICD-10-CM | POA: Diagnosis not present

## 2022-01-14 DIAGNOSIS — F3341 Major depressive disorder, recurrent, in partial remission: Secondary | ICD-10-CM | POA: Diagnosis not present

## 2022-01-14 DIAGNOSIS — E7800 Pure hypercholesterolemia, unspecified: Secondary | ICD-10-CM | POA: Diagnosis not present

## 2022-01-16 ENCOUNTER — Other Ambulatory Visit (HOSPITAL_COMMUNITY): Payer: Self-pay | Admitting: *Deleted

## 2022-01-17 ENCOUNTER — Ambulatory Visit (HOSPITAL_COMMUNITY)
Admission: RE | Admit: 2022-01-17 | Discharge: 2022-01-17 | Disposition: A | Discharge status: Home / Self Care | Payer: Medicare Other | Source: Ambulatory Visit | Attending: Nephrology | Admitting: Nephrology

## 2022-01-17 ENCOUNTER — Other Ambulatory Visit: Payer: Self-pay

## 2022-01-17 ENCOUNTER — Encounter (HOSPITAL_COMMUNITY): Payer: Medicare Other

## 2022-01-17 DIAGNOSIS — D631 Anemia in chronic kidney disease: Secondary | ICD-10-CM | POA: Diagnosis not present

## 2022-01-17 MED ORDER — SODIUM CHLORIDE 0.9 % IV SOLN
510.0000 mg | INTRAVENOUS | Status: DC
  Administered 2022-01-17: 510 mg via INTRAVENOUS
  Filled 2022-01-17: qty 510

## 2022-01-20 ENCOUNTER — Encounter (HOSPITAL_COMMUNITY): Payer: Medicare Other

## 2022-01-22 ENCOUNTER — Other Ambulatory Visit (HOSPITAL_COMMUNITY): Payer: Self-pay

## 2022-01-23 ENCOUNTER — Ambulatory Visit (HOSPITAL_COMMUNITY)
Admission: RE | Admit: 2022-01-23 | Discharge: 2022-01-23 | Disposition: A | Discharge status: Home / Self Care | Payer: Medicare Other | Source: Ambulatory Visit | Attending: Nephrology | Admitting: Nephrology

## 2022-01-23 ENCOUNTER — Other Ambulatory Visit: Payer: Self-pay

## 2022-01-23 DIAGNOSIS — D631 Anemia in chronic kidney disease: Secondary | ICD-10-CM | POA: Insufficient documentation

## 2022-01-23 DIAGNOSIS — N189 Chronic kidney disease, unspecified: Secondary | ICD-10-CM | POA: Diagnosis not present

## 2022-01-23 MED ORDER — SODIUM CHLORIDE 0.9 % IV SOLN
510.0000 mg | INTRAVENOUS | Status: DC
  Administered 2022-01-23: 510 mg via INTRAVENOUS
  Filled 2022-01-23: qty 510

## 2022-01-23 MED ORDER — SODIUM CHLORIDE 0.9 % IV SOLN
510.0000 mg | INTRAVENOUS | Status: DC
  Filled 2022-01-23: qty 17

## 2022-02-13 DIAGNOSIS — E210 Primary hyperparathyroidism: Secondary | ICD-10-CM | POA: Diagnosis not present

## 2022-02-13 DIAGNOSIS — N189 Chronic kidney disease, unspecified: Secondary | ICD-10-CM | POA: Diagnosis not present

## 2022-02-13 DIAGNOSIS — M109 Gout, unspecified: Secondary | ICD-10-CM | POA: Diagnosis not present

## 2022-02-13 DIAGNOSIS — N185 Chronic kidney disease, stage 5: Secondary | ICD-10-CM | POA: Diagnosis not present

## 2022-02-13 DIAGNOSIS — E7800 Pure hypercholesterolemia, unspecified: Secondary | ICD-10-CM | POA: Diagnosis not present

## 2022-02-13 DIAGNOSIS — E1122 Type 2 diabetes mellitus with diabetic chronic kidney disease: Secondary | ICD-10-CM | POA: Diagnosis not present

## 2022-02-13 DIAGNOSIS — E039 Hypothyroidism, unspecified: Secondary | ICD-10-CM | POA: Diagnosis not present

## 2022-02-13 DIAGNOSIS — I120 Hypertensive chronic kidney disease with stage 5 chronic kidney disease or end stage renal disease: Secondary | ICD-10-CM | POA: Diagnosis not present

## 2022-02-13 DIAGNOSIS — I5189 Other ill-defined heart diseases: Secondary | ICD-10-CM | POA: Diagnosis not present

## 2022-02-14 DIAGNOSIS — E1122 Type 2 diabetes mellitus with diabetic chronic kidney disease: Secondary | ICD-10-CM | POA: Diagnosis not present

## 2022-02-14 DIAGNOSIS — E782 Mixed hyperlipidemia: Secondary | ICD-10-CM | POA: Diagnosis not present

## 2022-02-14 DIAGNOSIS — E1169 Type 2 diabetes mellitus with other specified complication: Secondary | ICD-10-CM | POA: Diagnosis not present

## 2022-02-14 DIAGNOSIS — I10 Essential (primary) hypertension: Secondary | ICD-10-CM | POA: Diagnosis not present

## 2022-03-21 DIAGNOSIS — E1169 Type 2 diabetes mellitus with other specified complication: Secondary | ICD-10-CM | POA: Diagnosis not present

## 2022-03-21 DIAGNOSIS — E7800 Pure hypercholesterolemia, unspecified: Secondary | ICD-10-CM | POA: Diagnosis not present

## 2022-03-21 DIAGNOSIS — I10 Essential (primary) hypertension: Secondary | ICD-10-CM | POA: Diagnosis not present

## 2022-03-27 DIAGNOSIS — E039 Hypothyroidism, unspecified: Secondary | ICD-10-CM | POA: Diagnosis not present

## 2022-04-14 DIAGNOSIS — E210 Primary hyperparathyroidism: Secondary | ICD-10-CM | POA: Diagnosis not present

## 2022-04-14 DIAGNOSIS — N189 Chronic kidney disease, unspecified: Secondary | ICD-10-CM | POA: Diagnosis not present

## 2022-04-14 DIAGNOSIS — N185 Chronic kidney disease, stage 5: Secondary | ICD-10-CM | POA: Diagnosis not present

## 2022-04-14 DIAGNOSIS — I120 Hypertensive chronic kidney disease with stage 5 chronic kidney disease or end stage renal disease: Secondary | ICD-10-CM | POA: Diagnosis not present

## 2022-04-14 DIAGNOSIS — E1122 Type 2 diabetes mellitus with diabetic chronic kidney disease: Secondary | ICD-10-CM | POA: Diagnosis not present

## 2022-06-16 DIAGNOSIS — I120 Hypertensive chronic kidney disease with stage 5 chronic kidney disease or end stage renal disease: Secondary | ICD-10-CM | POA: Diagnosis not present

## 2022-06-16 DIAGNOSIS — E1122 Type 2 diabetes mellitus with diabetic chronic kidney disease: Secondary | ICD-10-CM | POA: Diagnosis not present

## 2022-06-16 DIAGNOSIS — E210 Primary hyperparathyroidism: Secondary | ICD-10-CM | POA: Diagnosis not present

## 2022-06-16 DIAGNOSIS — N185 Chronic kidney disease, stage 5: Secondary | ICD-10-CM | POA: Diagnosis not present

## 2022-06-30 DIAGNOSIS — E890 Postprocedural hypothyroidism: Secondary | ICD-10-CM | POA: Diagnosis not present

## 2022-06-30 DIAGNOSIS — I10 Essential (primary) hypertension: Secondary | ICD-10-CM | POA: Diagnosis not present

## 2022-06-30 DIAGNOSIS — E1165 Type 2 diabetes mellitus with hyperglycemia: Secondary | ICD-10-CM | POA: Diagnosis not present

## 2022-06-30 DIAGNOSIS — N189 Chronic kidney disease, unspecified: Secondary | ICD-10-CM | POA: Diagnosis not present

## 2022-06-30 DIAGNOSIS — E7800 Pure hypercholesterolemia, unspecified: Secondary | ICD-10-CM | POA: Diagnosis not present

## 2022-07-03 ENCOUNTER — Telehealth: Payer: Self-pay | Admitting: Nurse Practitioner

## 2022-07-03 NOTE — Telephone Encounter (Signed)
Attempted to contact husband to schedule Palliative Consult, no answer - left message with reason for call along with my name and call back number.

## 2022-07-07 DIAGNOSIS — N185 Chronic kidney disease, stage 5: Secondary | ICD-10-CM | POA: Diagnosis not present

## 2022-07-07 DIAGNOSIS — E1122 Type 2 diabetes mellitus with diabetic chronic kidney disease: Secondary | ICD-10-CM | POA: Diagnosis not present

## 2022-07-07 DIAGNOSIS — I120 Hypertensive chronic kidney disease with stage 5 chronic kidney disease or end stage renal disease: Secondary | ICD-10-CM | POA: Diagnosis not present

## 2022-07-07 DIAGNOSIS — E210 Primary hyperparathyroidism: Secondary | ICD-10-CM | POA: Diagnosis not present

## 2022-07-17 ENCOUNTER — Telehealth: Payer: Self-pay

## 2022-07-17 NOTE — Telephone Encounter (Signed)
Attempted to contact patient/patient's spouse Shanon Brow to schedule a Palliative Care consult appointment. No answer left a message to return call.

## 2022-08-01 ENCOUNTER — Telehealth: Payer: Self-pay | Admitting: Nurse Practitioner

## 2022-08-01 NOTE — Telephone Encounter (Signed)
Ret'd call to patient and she stated that she wanted to cancel the Palliative Consult appointment on 08/06/22 and she also wanted to cancel services all together for now.  She stated that she will let us know if she changes her mind and feels that she needs our services.

## 2022-08-06 ENCOUNTER — Other Ambulatory Visit: Payer: Self-pay | Admitting: Nurse Practitioner

## 2022-09-02 DIAGNOSIS — E782 Mixed hyperlipidemia: Secondary | ICD-10-CM | POA: Diagnosis not present

## 2022-09-02 DIAGNOSIS — E039 Hypothyroidism, unspecified: Secondary | ICD-10-CM | POA: Diagnosis not present

## 2022-09-02 DIAGNOSIS — N185 Chronic kidney disease, stage 5: Secondary | ICD-10-CM | POA: Diagnosis not present

## 2022-09-02 DIAGNOSIS — I10 Essential (primary) hypertension: Secondary | ICD-10-CM | POA: Diagnosis not present

## 2022-09-02 DIAGNOSIS — E1169 Type 2 diabetes mellitus with other specified complication: Secondary | ICD-10-CM | POA: Diagnosis not present

## 2022-09-02 DIAGNOSIS — F3341 Major depressive disorder, recurrent, in partial remission: Secondary | ICD-10-CM | POA: Diagnosis not present

## 2022-09-02 DIAGNOSIS — I5030 Unspecified diastolic (congestive) heart failure: Secondary | ICD-10-CM | POA: Diagnosis not present

## 2022-09-18 DIAGNOSIS — E1122 Type 2 diabetes mellitus with diabetic chronic kidney disease: Secondary | ICD-10-CM | POA: Diagnosis not present

## 2022-09-18 DIAGNOSIS — N189 Chronic kidney disease, unspecified: Secondary | ICD-10-CM | POA: Diagnosis not present

## 2022-09-18 DIAGNOSIS — N2581 Secondary hyperparathyroidism of renal origin: Secondary | ICD-10-CM | POA: Diagnosis not present

## 2022-09-18 DIAGNOSIS — N185 Chronic kidney disease, stage 5: Secondary | ICD-10-CM | POA: Diagnosis not present

## 2022-09-18 DIAGNOSIS — I120 Hypertensive chronic kidney disease with stage 5 chronic kidney disease or end stage renal disease: Secondary | ICD-10-CM | POA: Diagnosis not present

## 2022-12-08 DIAGNOSIS — I120 Hypertensive chronic kidney disease with stage 5 chronic kidney disease or end stage renal disease: Secondary | ICD-10-CM | POA: Diagnosis not present

## 2022-12-08 DIAGNOSIS — N189 Chronic kidney disease, unspecified: Secondary | ICD-10-CM | POA: Diagnosis not present

## 2022-12-08 DIAGNOSIS — E1122 Type 2 diabetes mellitus with diabetic chronic kidney disease: Secondary | ICD-10-CM | POA: Diagnosis not present

## 2022-12-08 DIAGNOSIS — E210 Primary hyperparathyroidism: Secondary | ICD-10-CM | POA: Diagnosis not present

## 2022-12-08 DIAGNOSIS — N185 Chronic kidney disease, stage 5: Secondary | ICD-10-CM | POA: Diagnosis not present

## 2023-02-06 DIAGNOSIS — N189 Chronic kidney disease, unspecified: Secondary | ICD-10-CM | POA: Diagnosis not present

## 2023-02-06 DIAGNOSIS — E1165 Type 2 diabetes mellitus with hyperglycemia: Secondary | ICD-10-CM | POA: Diagnosis not present

## 2023-02-06 DIAGNOSIS — E890 Postprocedural hypothyroidism: Secondary | ICD-10-CM | POA: Diagnosis not present

## 2023-02-06 DIAGNOSIS — E7800 Pure hypercholesterolemia, unspecified: Secondary | ICD-10-CM | POA: Diagnosis not present

## 2023-02-06 DIAGNOSIS — I10 Essential (primary) hypertension: Secondary | ICD-10-CM | POA: Diagnosis not present

## 2023-02-09 DIAGNOSIS — E7800 Pure hypercholesterolemia, unspecified: Secondary | ICD-10-CM | POA: Diagnosis not present

## 2023-02-09 DIAGNOSIS — F3341 Major depressive disorder, recurrent, in partial remission: Secondary | ICD-10-CM | POA: Diagnosis not present

## 2023-02-09 DIAGNOSIS — I10 Essential (primary) hypertension: Secondary | ICD-10-CM | POA: Diagnosis not present

## 2023-02-09 DIAGNOSIS — N185 Chronic kidney disease, stage 5: Secondary | ICD-10-CM | POA: Diagnosis not present

## 2023-02-09 DIAGNOSIS — E2839 Other primary ovarian failure: Secondary | ICD-10-CM | POA: Diagnosis not present

## 2023-02-09 DIAGNOSIS — Z0000 Encounter for general adult medical examination without abnormal findings: Secondary | ICD-10-CM | POA: Diagnosis not present

## 2023-02-09 DIAGNOSIS — E039 Hypothyroidism, unspecified: Secondary | ICD-10-CM | POA: Diagnosis not present

## 2023-02-09 DIAGNOSIS — I5030 Unspecified diastolic (congestive) heart failure: Secondary | ICD-10-CM | POA: Diagnosis not present

## 2023-02-09 DIAGNOSIS — E1122 Type 2 diabetes mellitus with diabetic chronic kidney disease: Secondary | ICD-10-CM | POA: Diagnosis not present

## 2023-02-09 DIAGNOSIS — Z1331 Encounter for screening for depression: Secondary | ICD-10-CM | POA: Diagnosis not present

## 2023-02-09 DIAGNOSIS — Z1231 Encounter for screening mammogram for malignant neoplasm of breast: Secondary | ICD-10-CM | POA: Diagnosis not present

## 2023-02-27 ENCOUNTER — Other Ambulatory Visit: Payer: Self-pay | Admitting: Internal Medicine

## 2023-02-27 DIAGNOSIS — E2839 Other primary ovarian failure: Secondary | ICD-10-CM

## 2023-04-13 DIAGNOSIS — E890 Postprocedural hypothyroidism: Secondary | ICD-10-CM | POA: Diagnosis not present

## 2023-04-15 DIAGNOSIS — E1122 Type 2 diabetes mellitus with diabetic chronic kidney disease: Secondary | ICD-10-CM | POA: Diagnosis not present

## 2023-04-15 DIAGNOSIS — N189 Chronic kidney disease, unspecified: Secondary | ICD-10-CM | POA: Diagnosis not present

## 2023-04-15 DIAGNOSIS — E210 Primary hyperparathyroidism: Secondary | ICD-10-CM | POA: Diagnosis not present

## 2023-04-15 DIAGNOSIS — N185 Chronic kidney disease, stage 5: Secondary | ICD-10-CM | POA: Diagnosis not present

## 2023-04-15 DIAGNOSIS — I120 Hypertensive chronic kidney disease with stage 5 chronic kidney disease or end stage renal disease: Secondary | ICD-10-CM | POA: Diagnosis not present

## 2023-04-20 ENCOUNTER — Other Ambulatory Visit: Payer: Self-pay | Admitting: *Deleted

## 2023-04-20 DIAGNOSIS — N184 Chronic kidney disease, stage 4 (severe): Secondary | ICD-10-CM

## 2023-04-21 ENCOUNTER — Ambulatory Visit (HOSPITAL_COMMUNITY): Payer: Medicare Other

## 2023-04-22 ENCOUNTER — Ambulatory Visit: Payer: Medicare Other

## 2023-04-23 ENCOUNTER — Ambulatory Visit (HOSPITAL_COMMUNITY)
Admission: RE | Admit: 2023-04-23 | Discharge: 2023-04-23 | Disposition: A | Discharge status: Home / Self Care | Payer: Medicare Other | Source: Ambulatory Visit | Attending: Physician Assistant | Admitting: Physician Assistant

## 2023-04-23 DIAGNOSIS — N184 Chronic kidney disease, stage 4 (severe): Secondary | ICD-10-CM | POA: Diagnosis not present

## 2023-05-04 ENCOUNTER — Ambulatory Visit (INDEPENDENT_AMBULATORY_CARE_PROVIDER_SITE_OTHER): Payer: Medicare Other | Admitting: Physician Assistant

## 2023-05-04 VITALS — BP 134/73 | HR 84 | Temp 98.0°F | Resp 16 | Ht 64.0 in | Wt 142.0 lb

## 2023-05-04 DIAGNOSIS — N184 Chronic kidney disease, stage 4 (severe): Secondary | ICD-10-CM

## 2023-05-04 NOTE — Progress Notes (Signed)
[facility] [place] HISTORY AND PHYSICAL   History of Present Illness:  Patient is a 82 y.o. year old female who presents for examination of malfunctioning av fistula.  left brachiocephalic fistula superficialization  (Date: 10/06/19).   She states she has not had HD and wishes to never have HD.  The fistula has never been access since 2020.  She denies pain, symptoms of steal.          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  02-09-18   CHF (congestive heart failure) (HCC)    Has shortness of breath if she does not take Lasix   Chronic kidney disease    Stated it is 29% functioning   Depression    Diabetes mellitus without complication (HCC)    Not on medication   Dyspnea    Hypertension    Hypothyroidism     Past Surgical History:  Procedure Laterality Date   APPENDECTOMY     4 th grade   AV FISTULA PLACEMENT Left 05/12/2019   Procedure: BRACHIOCEPHALIC ARTERIOVENOUS (AV) FISTULA CREATION LEFT ARM;  Surgeon: Nada Libman, MD;  Location: [facility] OR;  Service: Vascular;  Laterality: Left;   EYE SURGERY  2014   Bilateral 6 mos. apart  cataracts   FISTULA SUPERFICIALIZATION Left 10/06/2019   Procedure: ELEVATION OF ARTERIOVENOUS FISTULA LEFT ARM;  Surgeon: Nada Libman, MD;  Location: [facility] OR;  Service: Vascular;  Laterality: Left;   LYMPH NODE BIOPSY N/A 02/09/2018   Procedure: SENTINEL LYMPH NODE BIOPSY;  Surgeon: Adolphus Birchwood, MD;  Location: [facility] ORS;  Service: Gynecology;  Laterality: N/A;   PARATHYROIDECTOMY N/A 12/07/2014   Procedure: PARATHYROIDECTOMY;  Surgeon: Darnell Level, MD;  Location: [facility];  Service: General;  Laterality: N/A;   ROBOTIC ASSISTED TOTAL HYSTERECTOMY WITH BILATERAL SALPINGO OOPHERECTOMY Bilateral 02/09/2018   Procedure: XI ROBOTIC ASSISTED TOTAL HYSTERECTOMY WITH BILATERAL SALPINGO OOPHORECTOMY;  Surgeon: Adolphus Birchwood, MD;  Location: [facility];  Service: Gynecology;  Laterality: Bilateral;   THYROID LOBECTOMY Left 12/07/2014   Procedure: LEFT THYROID LOBECTOMY;  Surgeon: Darnell Level, MD;  Location: [facility];  Service: General;  Laterality: Left;   TONSILLECTOMY     TUBAL LIGATION     Early thirties     Social History Social History   Tobacco Use   Smoking status: Never   Smokeless tobacco: Never  Vaping Use   Vaping Use: Never used  Substance Use Topics   Alcohol use: No   Drug use: No    Family History Family History  Problem Relation Age of Onset   Hypertension Mother    Heart disease Mother    Hypertension Father    Heart disease Father    Cervical cancer Maternal Aunt     Allergies  Allergies  Allergen Reactions   Plum Pulp Anaphylaxis and Cough   Citrus     Coughing, chest tightness    Egg-Derived Products Itching    congestion   Other Itching    Dairy products/Soaps & detergents   Penicillins Rash    Has patient had a PCN reaction causing immediate rash, facial/tongue/throat swelling, SOB or lightheadedness with hypotension: Yes Has patient had a PCN reaction causing severe rash involving mucus membranes or skin necrosis: No Has patient had a PCN reaction that required hospitalization: No Has patient  had a PCN reaction occurring within the last 10 years: No If all of the above answers are "NO", then may proceed with Cephalosporin use.      Current Outpatient Medications  Medication Sig Dispense Refill   acetaminophen (TYLENOL) 500 MG tablet Take 500-1,000 mg by mouth every 6 (six) hours as needed (pain).      allopurinol (ZYLOPRIM) 100 MG tablet Take 100 mg by mouth every evening.     aspirin 325 MG EC tablet Take 325 mg by mouth at bedtime.     calcitRIOL (ROCALTROL) 0.5 MCG capsule Take 0.5 mcg by mouth daily.      CALCIUM PO Take 1 tablet by mouth daily at 12 noon.      EUTHYROX 100 MCG tablet Take 100 mcg by mouth daily before breakfast.     Ferrous Sulfate  (IRON PO) Take 1 tablet by mouth daily at 12 noon.      fexofenadine (ALLEGRA) 60 MG tablet Take 60 mg by mouth daily as needed for allergies or rhinitis.     FLUoxetine (PROZAC) 10 MG capsule Take 10 mg by mouth 3 (three) times a week. At night     furosemide (LASIX) 40 MG tablet Take 80 mg by mouth 2 (two) times daily.      HYDROcodone-acetaminophen (NORCO) 5-325 MG tablet Take 1 tablet by mouth every 6 (six) hours as needed for moderate pain. 10 tablet 0   hydrOXYzine (ATARAX/VISTARIL) 10 MG tablet Take 10 mg by mouth at bedtime as needed for itching.     sitaGLIPtin (JANUVIA) 25 MG tablet Take 25 mg by mouth daily.     sodium bicarbonate 650 MG tablet Take 650 mg by mouth 2 (two) times daily.     No current facility-administered medications for this visit.    ROS:   General:  No weight loss, Fever, chills  HEENT: No recent headaches, no nasal bleeding, no visual changes, no sore throat  Neurologic: No dizziness, blackouts, seizures. No recent symptoms of stroke or mini- stroke. No recent episodes of slurred speech, or temporary blindness.  Cardiac: No recent episodes of chest pain/pressure, no shortness of breath at rest.  No shortness of breath with exertion.  Denies history of atrial fibrillation or irregular heartbeat  Vascular: No history of rest pain in feet.  No history of claudication.  No history of non-healing ulcer, No history of DVT   Pulmonary: No home oxygen, no productive cough, no hemoptysis,  No asthma or wheezing  Musculoskeletal:  [ ]  Arthritis, [ ]  Low back pain,  [ ]  Joint pain  Hematologic:No history of hypercoagulable state.  No history of easy bleeding.  No history of anemia  Gastrointestinal: No hematochezia or melena,  No gastroesophageal reflux, no trouble swallowing  Urinary: [ ]  chronic Kidney disease, [ ]  on HD - [ ]  MWF or [ ]  TTHS, [ ]  Burning with urination, [ ]  Frequent urination, [ ]  Difficulty urinating;   Skin: No rashes  Psychological: No  history of anxiety,  No history of depression   Physical Examination  Vitals:   05/04/23 0943  BP: 134/73  Pulse: 84  Resp: 16  Temp: 98 F (36.7 C)  TempSrc: Temporal  SpO2: 96%  Weight: 142 lb (64.4 kg)  Height: 5\' 4"  (1.626 m)    Body mass index is 24.37 kg/m.  General:  Alert and oriented, no acute distress HEENT: Normal Neck: No bruit or JVD Pulmonary: Clear to auscultation bilaterally Cardiac: Regular Rate and Rhythm without  murmur Gastrointestinal: Soft, non-tender, non-distended, no mass, no scars Skin: No rash Extremity Pulses:  2+ radial pulses bilaterally, weak thrill in fistula pulsitile Musculoskeletal: No deformity or edema  Neurologic: Upper and lower extremity motor 5/5 and symmetric  DATA:  Findings:  +--------------------+----------+-----------------+---------+  AVF                PSV (cm/s)Flow Vol (mL/min)Comments   +--------------------+----------+-----------------+---------+  Native artery inflow    87                     pulsatile  +--------------------+----------+-----------------+---------+  AVF Anastomosis         50                                +--------------------+----------+-----------------+---------+     +------------+----------+-------------+----------+-------------+  OUTFLOW VEINPSV (cm/s)Diameter (cm)Depth (cm)  Describe     +------------+----------+-------------+----------+-------------+  Prox UA         9         0.30        0.48                  +------------+----------+-------------+----------+-------------+  Mid UA         357        0.31        0.29      patent      +------------+----------+-------------+----------+-------------+  Dist UA         7         0.42        0.20   Sub occlusion  +------------+----------+-------------+----------+-------------+  AC Fossa       287                                          +------------+----------+-------------+----------+-------------+         Summary:  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 Patent in the upper arm.  Outflow vein diameters < 4 cm.      ASSESSMENT/PLAN: CKD She has had a functioning left UE AV fistula since 09/2019.  She has never required HD.  The fistula is pending failure with near total occlusion demonstrated on duplex 04/23/23.  She states she does not wish to start HD or ever.  She does not want surgery intervention to maintain a working fistula.  She states she would just like to make it to 82 y/s.  If she declines intervention she understands she will need a TDC if she emergently decides to have HD.  She agrees to decline intervention today.  She will f/u PRN.      Mosetta Pigeon PA-C [facility] [place] Office: [phone]  MD in clinic [place]

## 2023-06-03 ENCOUNTER — Encounter: Payer: Self-pay | Admitting: Emergency Medicine

## 2023-06-03 DIAGNOSIS — Z8542 Personal history of malignant neoplasm of other parts of uterus: Secondary | ICD-10-CM

## 2023-06-03 DIAGNOSIS — R601 Generalized edema: Secondary | ICD-10-CM | POA: Diagnosis not present

## 2023-06-03 DIAGNOSIS — I214 Non-ST elevation (NSTEMI) myocardial infarction: Secondary | ICD-10-CM | POA: Diagnosis not present

## 2023-06-03 DIAGNOSIS — Z5329 Procedure and treatment not carried out because of patient's decision for other reasons: Secondary | ICD-10-CM | POA: Diagnosis not present

## 2023-06-03 DIAGNOSIS — N179 Acute kidney failure, unspecified: Secondary | ICD-10-CM | POA: Diagnosis not present

## 2023-06-03 DIAGNOSIS — M1990 Unspecified osteoarthritis, unspecified site: Secondary | ICD-10-CM | POA: Diagnosis present

## 2023-06-03 DIAGNOSIS — N189 Chronic kidney disease, unspecified: Secondary | ICD-10-CM | POA: Diagnosis not present

## 2023-06-03 DIAGNOSIS — E872 Acidosis, unspecified: Secondary | ICD-10-CM | POA: Diagnosis present

## 2023-06-03 DIAGNOSIS — R3915 Urgency of urination: Secondary | ICD-10-CM | POA: Diagnosis present

## 2023-06-03 DIAGNOSIS — F419 Anxiety disorder, unspecified: Secondary | ICD-10-CM | POA: Diagnosis present

## 2023-06-03 DIAGNOSIS — F32A Depression, unspecified: Secondary | ICD-10-CM | POA: Diagnosis present

## 2023-06-03 DIAGNOSIS — I420 Dilated cardiomyopathy: Secondary | ICD-10-CM | POA: Diagnosis present

## 2023-06-03 DIAGNOSIS — I132 Hypertensive heart and chronic kidney disease with heart failure and with stage 5 chronic kidney disease, or end stage renal disease: Secondary | ICD-10-CM | POA: Diagnosis present

## 2023-06-03 DIAGNOSIS — E1122 Type 2 diabetes mellitus with diabetic chronic kidney disease: Secondary | ICD-10-CM | POA: Diagnosis not present

## 2023-06-03 DIAGNOSIS — Z7984 Long term (current) use of oral hypoglycemic drugs: Secondary | ICD-10-CM

## 2023-06-03 DIAGNOSIS — R0602 Shortness of breath: Secondary | ICD-10-CM | POA: Diagnosis not present

## 2023-06-03 DIAGNOSIS — I429 Cardiomyopathy, unspecified: Secondary | ICD-10-CM | POA: Diagnosis not present

## 2023-06-03 DIAGNOSIS — J449 Chronic obstructive pulmonary disease, unspecified: Secondary | ICD-10-CM | POA: Diagnosis not present

## 2023-06-03 DIAGNOSIS — N1831 Chronic kidney disease, stage 3a: Secondary | ICD-10-CM | POA: Diagnosis not present

## 2023-06-03 DIAGNOSIS — N2581 Secondary hyperparathyroidism of renal origin: Secondary | ICD-10-CM | POA: Diagnosis present

## 2023-06-03 DIAGNOSIS — Z79890 Hormone replacement therapy (postmenopausal): Secondary | ICD-10-CM

## 2023-06-03 DIAGNOSIS — N185 Chronic kidney disease, stage 5: Secondary | ICD-10-CM | POA: Diagnosis not present

## 2023-06-03 DIAGNOSIS — D539 Nutritional anemia, unspecified: Secondary | ICD-10-CM | POA: Diagnosis present

## 2023-06-03 DIAGNOSIS — J45909 Unspecified asthma, uncomplicated: Secondary | ICD-10-CM | POA: Diagnosis present

## 2023-06-03 DIAGNOSIS — I2510 Atherosclerotic heart disease of native coronary artery without angina pectoris: Secondary | ICD-10-CM | POA: Diagnosis present

## 2023-06-03 DIAGNOSIS — I255 Ischemic cardiomyopathy: Secondary | ICD-10-CM | POA: Diagnosis not present

## 2023-06-03 DIAGNOSIS — F05 Delirium due to known physiological condition: Secondary | ICD-10-CM | POA: Diagnosis not present

## 2023-06-03 DIAGNOSIS — Z8249 Family history of ischemic heart disease and other diseases of the circulatory system: Secondary | ICD-10-CM

## 2023-06-03 DIAGNOSIS — Z7982 Long term (current) use of aspirin: Secondary | ICD-10-CM

## 2023-06-03 DIAGNOSIS — I5021 Acute systolic (congestive) heart failure: Secondary | ICD-10-CM | POA: Diagnosis not present

## 2023-06-03 DIAGNOSIS — E8770 Fluid overload, unspecified: Secondary | ICD-10-CM | POA: Diagnosis not present

## 2023-06-03 DIAGNOSIS — E039 Hypothyroidism, unspecified: Secondary | ICD-10-CM | POA: Diagnosis present

## 2023-06-03 DIAGNOSIS — I5041 Acute combined systolic (congestive) and diastolic (congestive) heart failure: Secondary | ICD-10-CM | POA: Diagnosis not present

## 2023-06-03 DIAGNOSIS — F0390 Unspecified dementia without behavioral disturbance: Secondary | ICD-10-CM | POA: Diagnosis present

## 2023-06-03 DIAGNOSIS — Z8049 Family history of malignant neoplasm of other genital organs: Secondary | ICD-10-CM

## 2023-06-03 DIAGNOSIS — D631 Anemia in chronic kidney disease: Secondary | ICD-10-CM | POA: Diagnosis not present

## 2023-06-03 DIAGNOSIS — Z90710 Acquired absence of both cervix and uterus: Secondary | ICD-10-CM

## 2023-06-03 DIAGNOSIS — I5043 Acute on chronic combined systolic (congestive) and diastolic (congestive) heart failure: Secondary | ICD-10-CM | POA: Diagnosis present

## 2023-06-03 DIAGNOSIS — Z880 Allergy status to penicillin: Secondary | ICD-10-CM

## 2023-06-03 DIAGNOSIS — I5033 Acute on chronic diastolic (congestive) heart failure: Secondary | ICD-10-CM | POA: Diagnosis not present

## 2023-06-03 DIAGNOSIS — R600 Localized edema: Secondary | ICD-10-CM | POA: Diagnosis not present

## 2023-06-03 DIAGNOSIS — Z79899 Other long term (current) drug therapy: Secondary | ICD-10-CM

## 2023-06-03 DIAGNOSIS — N184 Chronic kidney disease, stage 4 (severe): Secondary | ICD-10-CM | POA: Diagnosis not present

## 2023-06-03 DIAGNOSIS — Z85828 Personal history of other malignant neoplasm of skin: Secondary | ICD-10-CM

## 2023-06-03 NOTE — ED Triage Notes (Signed)
Pt c/o increased SOB and swelling to bilateral lower extremities x4 days. Hx/o CHF and compliant with 80mg  Lasix BID.

## 2023-06-04 ENCOUNTER — Emergency Department: Payer: Medicare Other

## 2023-06-04 ENCOUNTER — Inpatient Hospital Stay
Admission: EM | Admit: 2023-06-04 | Discharge: 2023-06-07 | DRG: 280 | Discharge status: Against Medical Advice | Payer: Medicare Other | Attending: Internal Medicine | Admitting: Internal Medicine

## 2023-06-04 ENCOUNTER — Other Ambulatory Visit: Payer: Self-pay

## 2023-06-04 ENCOUNTER — Encounter: Payer: Self-pay | Admitting: Family Medicine

## 2023-06-04 ENCOUNTER — Inpatient Hospital Stay (HOSPITAL_COMMUNITY)
Admit: 2023-06-04 | Discharge: 2023-06-04 | Disposition: A | Discharge status: Home / Self Care | Payer: Medicare Other | Attending: Family Medicine | Admitting: Family Medicine

## 2023-06-04 DIAGNOSIS — I132 Hypertensive heart and chronic kidney disease with heart failure and with stage 5 chronic kidney disease, or end stage renal disease: Secondary | ICD-10-CM | POA: Diagnosis present

## 2023-06-04 DIAGNOSIS — I5033 Acute on chronic diastolic (congestive) heart failure: Secondary | ICD-10-CM | POA: Diagnosis not present

## 2023-06-04 DIAGNOSIS — I429 Cardiomyopathy, unspecified: Secondary | ICD-10-CM

## 2023-06-04 DIAGNOSIS — I5021 Acute systolic (congestive) heart failure: Secondary | ICD-10-CM | POA: Diagnosis not present

## 2023-06-04 DIAGNOSIS — J449 Chronic obstructive pulmonary disease, unspecified: Secondary | ICD-10-CM | POA: Diagnosis not present

## 2023-06-04 DIAGNOSIS — E119 Type 2 diabetes mellitus without complications: Secondary | ICD-10-CM | POA: Insufficient documentation

## 2023-06-04 DIAGNOSIS — D539 Nutritional anemia, unspecified: Secondary | ICD-10-CM | POA: Diagnosis present

## 2023-06-04 DIAGNOSIS — I2510 Atherosclerotic heart disease of native coronary artery without angina pectoris: Secondary | ICD-10-CM | POA: Diagnosis present

## 2023-06-04 DIAGNOSIS — J45909 Unspecified asthma, uncomplicated: Secondary | ICD-10-CM | POA: Diagnosis present

## 2023-06-04 DIAGNOSIS — E1122 Type 2 diabetes mellitus with diabetic chronic kidney disease: Secondary | ICD-10-CM | POA: Insufficient documentation

## 2023-06-04 DIAGNOSIS — N184 Chronic kidney disease, stage 4 (severe): Secondary | ICD-10-CM | POA: Diagnosis not present

## 2023-06-04 DIAGNOSIS — N189 Chronic kidney disease, unspecified: Secondary | ICD-10-CM

## 2023-06-04 DIAGNOSIS — I5043 Acute on chronic combined systolic (congestive) and diastolic (congestive) heart failure: Secondary | ICD-10-CM | POA: Diagnosis present

## 2023-06-04 DIAGNOSIS — Z7984 Long term (current) use of oral hypoglycemic drugs: Secondary | ICD-10-CM | POA: Diagnosis not present

## 2023-06-04 DIAGNOSIS — R600 Localized edema: Secondary | ICD-10-CM | POA: Diagnosis not present

## 2023-06-04 DIAGNOSIS — E039 Hypothyroidism, unspecified: Secondary | ICD-10-CM | POA: Diagnosis present

## 2023-06-04 DIAGNOSIS — F32A Depression, unspecified: Secondary | ICD-10-CM | POA: Diagnosis present

## 2023-06-04 DIAGNOSIS — R3915 Urgency of urination: Secondary | ICD-10-CM | POA: Diagnosis present

## 2023-06-04 DIAGNOSIS — E872 Acidosis, unspecified: Secondary | ICD-10-CM | POA: Diagnosis present

## 2023-06-04 DIAGNOSIS — I214 Non-ST elevation (NSTEMI) myocardial infarction: Secondary | ICD-10-CM

## 2023-06-04 DIAGNOSIS — R0602 Shortness of breath: Secondary | ICD-10-CM | POA: Diagnosis present

## 2023-06-04 DIAGNOSIS — N179 Acute kidney failure, unspecified: Secondary | ICD-10-CM | POA: Diagnosis present

## 2023-06-04 DIAGNOSIS — R601 Generalized edema: Secondary | ICD-10-CM | POA: Diagnosis not present

## 2023-06-04 DIAGNOSIS — I5041 Acute combined systolic (congestive) and diastolic (congestive) heart failure: Secondary | ICD-10-CM

## 2023-06-04 DIAGNOSIS — E8770 Fluid overload, unspecified: Secondary | ICD-10-CM | POA: Diagnosis not present

## 2023-06-04 DIAGNOSIS — I420 Dilated cardiomyopathy: Secondary | ICD-10-CM

## 2023-06-04 DIAGNOSIS — M1990 Unspecified osteoarthritis, unspecified site: Secondary | ICD-10-CM | POA: Diagnosis present

## 2023-06-04 DIAGNOSIS — N2581 Secondary hyperparathyroidism of renal origin: Secondary | ICD-10-CM | POA: Diagnosis present

## 2023-06-04 DIAGNOSIS — F0390 Unspecified dementia without behavioral disturbance: Secondary | ICD-10-CM | POA: Diagnosis present

## 2023-06-04 DIAGNOSIS — N1831 Chronic kidney disease, stage 3a: Secondary | ICD-10-CM | POA: Diagnosis not present

## 2023-06-04 DIAGNOSIS — I255 Ischemic cardiomyopathy: Secondary | ICD-10-CM | POA: Diagnosis present

## 2023-06-04 DIAGNOSIS — F419 Anxiety disorder, unspecified: Secondary | ICD-10-CM | POA: Diagnosis present

## 2023-06-04 DIAGNOSIS — Z5329 Procedure and treatment not carried out because of patient's decision for other reasons: Secondary | ICD-10-CM | POA: Diagnosis not present

## 2023-06-04 DIAGNOSIS — D631 Anemia in chronic kidney disease: Secondary | ICD-10-CM | POA: Diagnosis present

## 2023-06-04 DIAGNOSIS — N185 Chronic kidney disease, stage 5: Secondary | ICD-10-CM | POA: Diagnosis present

## 2023-06-04 DIAGNOSIS — R410 Disorientation, unspecified: Secondary | ICD-10-CM

## 2023-06-04 DIAGNOSIS — F05 Delirium due to known physiological condition: Secondary | ICD-10-CM | POA: Diagnosis not present

## 2023-06-04 HISTORY — DX: Basal cell carcinoma of skin, unspecified: C44.91

## 2023-06-04 HISTORY — DX: Chronic kidney disease, stage 4 (severe): N18.4

## 2023-06-04 LAB — CBC
HCT: 27.3 % — ABNORMAL LOW (ref 36.0–46.0)
HCT: 27.6 % — ABNORMAL LOW (ref 36.0–46.0)
Hemoglobin: 8.2 g/dL — ABNORMAL LOW (ref 12.0–15.0)
Hemoglobin: 8.5 g/dL — ABNORMAL LOW (ref 12.0–15.0)
MCH: 30 pg (ref 26.0–34.0)
MCH: 30.2 pg (ref 26.0–34.0)
MCHC: 30 g/dL (ref 30.0–36.0)
MCHC: 30.8 g/dL (ref 30.0–36.0)
MCV: 100 fL (ref 80.0–100.0)
MCV: 98.2 fL (ref 80.0–100.0)
Platelets: 216 10*3/uL (ref 150–400)
Platelets: 227 10*3/uL (ref 150–400)
RBC: 2.73 MIL/uL — ABNORMAL LOW (ref 3.87–5.11)
RBC: 2.81 MIL/uL — ABNORMAL LOW (ref 3.87–5.11)
RDW: 13.1 % (ref 11.5–15.5)
RDW: 13.2 % (ref 11.5–15.5)
WBC: 6.9 10*3/uL (ref 4.0–10.5)
WBC: 7.1 10*3/uL (ref 4.0–10.5)
nRBC: 0 % (ref 0.0–0.2)
nRBC: 0 % (ref 0.0–0.2)

## 2023-06-04 LAB — BASIC METABOLIC PANEL
Anion gap: 16 — ABNORMAL HIGH (ref 5–15)
Anion gap: 16 — ABNORMAL HIGH (ref 5–15)
BUN: 98 mg/dL — ABNORMAL HIGH (ref 8–23)
BUN: 99 mg/dL — ABNORMAL HIGH (ref 8–23)
CO2: 17 mmol/L — ABNORMAL LOW (ref 22–32)
CO2: 18 mmol/L — ABNORMAL LOW (ref 22–32)
Calcium: 9.5 mg/dL (ref 8.9–10.3)
Calcium: 9.5 mg/dL (ref 8.9–10.3)
Chloride: 107 mmol/L (ref 98–111)
Chloride: 108 mmol/L (ref 98–111)
Creatinine, Ser: 6.11 mg/dL — ABNORMAL HIGH (ref 0.44–1.00)
Creatinine, Ser: 6.24 mg/dL — ABNORMAL HIGH (ref 0.44–1.00)
GFR, Estimated: 6 mL/min — ABNORMAL LOW (ref >60)
GFR, Estimated: 6 mL/min — ABNORMAL LOW (ref >60)
Glucose, Bld: 124 mg/dL — ABNORMAL HIGH (ref 70–99)
Glucose, Bld: 172 mg/dL — ABNORMAL HIGH (ref 70–99)
Potassium: 4.5 mmol/L (ref 3.5–5.1)
Potassium: 4.8 mmol/L (ref 3.5–5.1)
Sodium: 141 mmol/L (ref 135–145)
Sodium: 141 mmol/L (ref 135–145)

## 2023-06-04 LAB — ECHOCARDIOGRAM COMPLETE
AR max vel: 1.69 cm2
AV Area VTI: 1.57 cm2
AV Area mean vel: 1.37 cm2
AV Mean grad: 8 mmHg
AV Peak grad: 13.1 mmHg
Ao pk vel: 1.81 m/s
Area-P 1/2: 5.16 cm2
Calc EF: 47.6 %
MV M vel: 5.68 m/s
MV Peak grad: 129 mmHg
MV VTI: 2.11 cm2
Radius: 0.4 cm
S' Lateral: 4.2 cm
Single Plane A2C EF: 43.4 %
Single Plane A4C EF: 49.3 %

## 2023-06-04 LAB — CBG MONITORING, ED
Glucose-Capillary: 112 mg/dL — ABNORMAL HIGH (ref 70–99)
Glucose-Capillary: 113 mg/dL — ABNORMAL HIGH (ref 70–99)
Glucose-Capillary: 144 mg/dL — ABNORMAL HIGH (ref 70–99)

## 2023-06-04 LAB — PROTIME-INR
INR: 1.2 (ref 0.8–1.2)
Prothrombin Time: 15 seconds (ref 11.4–15.2)

## 2023-06-04 LAB — TROPONIN I (HIGH SENSITIVITY)
Troponin I (High Sensitivity): 986 ng/L (ref <18)
Troponin I (High Sensitivity): 998 ng/L (ref <18)

## 2023-06-04 LAB — BRAIN NATRIURETIC PEPTIDE: B Natriuretic Peptide: 2261.8 pg/mL — ABNORMAL HIGH (ref 0.0–100.0)

## 2023-06-04 LAB — HEMOGLOBIN A1C
Hgb A1c MFr Bld: 6.4 % — ABNORMAL HIGH (ref 4.8–5.6)
Mean Plasma Glucose: 136.98 mg/dL

## 2023-06-04 LAB — HEPARIN LEVEL (UNFRACTIONATED)
Heparin Unfractionated: 0.15 IU/mL — ABNORMAL LOW (ref 0.30–0.70)
Heparin Unfractionated: 0.61 IU/mL (ref 0.30–0.70)

## 2023-06-04 LAB — TSH: TSH: 2.976 u[IU]/mL (ref 0.350–4.500)

## 2023-06-04 LAB — APTT: aPTT: 35 seconds (ref 24–36)

## 2023-06-04 MED ORDER — HEPARIN BOLUS VIA INFUSION
2000.0000 [IU] | Freq: Once | INTRAVENOUS | Status: AC
  Administered 2023-06-04: 2000 [IU] via INTRAVENOUS
  Filled 2023-06-04: qty 2000

## 2023-06-04 MED ORDER — ONDANSETRON HCL 4 MG/2ML IJ SOLN
4.0000 mg | Freq: Four times a day (QID) | INTRAMUSCULAR | Status: DC | PRN
  Administered 2023-06-05: 4 mg via INTRAVENOUS
  Filled 2023-06-04: qty 2

## 2023-06-04 MED ORDER — ALLOPURINOL 100 MG PO TABS: 100.0000 mg | ORAL_TABLET | Freq: Every day | ORAL | Status: DC | PRN

## 2023-06-04 MED ORDER — HYDROXYZINE HCL 10 MG PO TABS: 10.0000 mg | ORAL_TABLET | Freq: Every evening | ORAL | Status: DC | PRN

## 2023-06-04 MED ORDER — CALCITRIOL 0.25 MCG PO CAPS
0.5000 ug | ORAL_CAPSULE | Freq: Every day | ORAL | Status: DC
  Administered 2023-06-04 – 2023-06-05 (×2): 0.5 ug via ORAL
  Filled 2023-06-04 (×2): qty 2

## 2023-06-04 MED ORDER — MORPHINE SULFATE (PF) 2 MG/ML IV SOLN: 2.0000 mg | INTRAVENOUS | Status: DC | PRN

## 2023-06-04 MED ORDER — HYDROCODONE-ACETAMINOPHEN 5-325 MG PO TABS: 1.0000 | ORAL_TABLET | Freq: Four times a day (QID) | ORAL | Status: DC | PRN

## 2023-06-04 MED ORDER — FUROSEMIDE 10 MG/ML IJ SOLN
80.0000 mg | Freq: Once | INTRAMUSCULAR | Status: AC
  Administered 2023-06-04: 80 mg via INTRAVENOUS
  Filled 2023-06-04: qty 8

## 2023-06-04 MED ORDER — INSULIN ASPART 100 UNIT/ML IJ SOLN
0.0000 [IU] | Freq: Three times a day (TID) | INTRAMUSCULAR | Status: DC
  Administered 2023-06-05: 2 [IU] via SUBCUTANEOUS
  Administered 2023-06-06: 5 [IU] via SUBCUTANEOUS
  Administered 2023-06-07: 2 [IU] via SUBCUTANEOUS
  Filled 2023-06-04 (×4): qty 1

## 2023-06-04 MED ORDER — HEPARIN (PORCINE) 25000 UT/250ML-% IV SOLN
1100.0000 [IU]/h | INTRAVENOUS | Status: DC
  Administered 2023-06-04: 1100 [IU]/h via INTRAVENOUS
  Administered 2023-06-04: 850 [IU]/h via INTRAVENOUS
  Administered 2023-06-05: 1100 [IU]/h via INTRAVENOUS
  Filled 2023-06-04 (×3): qty 250

## 2023-06-04 MED ORDER — LINAGLIPTIN 5 MG PO TABS
5.0000 mg | ORAL_TABLET | Freq: Every day | ORAL | Status: DC
  Filled 2023-06-04: qty 1

## 2023-06-04 MED ORDER — ASPIRIN 81 MG PO CHEW
324.0000 mg | CHEWABLE_TABLET | Freq: Once | ORAL | Status: AC
  Administered 2023-06-04: 324 mg via ORAL
  Filled 2023-06-04: qty 4

## 2023-06-04 MED ORDER — FUROSEMIDE 10 MG/ML IJ SOLN
40.0000 mg | Freq: Two times a day (BID) | INTRAMUSCULAR | Status: DC
  Administered 2023-06-04 – 2023-06-07 (×6): 40 mg via INTRAVENOUS
  Filled 2023-06-04 (×7): qty 4

## 2023-06-04 MED ORDER — ACETAMINOPHEN 325 MG PO TABS
650.0000 mg | ORAL_TABLET | Freq: Four times a day (QID) | ORAL | Status: DC | PRN
  Administered 2023-06-05: 650 mg via ORAL
  Filled 2023-06-04: qty 2

## 2023-06-04 MED ORDER — FERROUS SULFATE 325 (65 FE) MG PO TABS
325.0000 mg | ORAL_TABLET | Freq: Every day | ORAL | Status: DC
  Administered 2023-06-04 – 2023-06-07 (×3): 325 mg via ORAL
  Filled 2023-06-04 (×3): qty 1

## 2023-06-04 MED ORDER — ONDANSETRON HCL 4 MG PO TABS: 4.0000 mg | ORAL_TABLET | Freq: Four times a day (QID) | ORAL | Status: DC | PRN

## 2023-06-04 MED ORDER — TRAZODONE HCL 50 MG PO TABS: 25.0000 mg | ORAL_TABLET | Freq: Every evening | ORAL | Status: DC | PRN

## 2023-06-04 MED ORDER — NITROGLYCERIN 0.4 MG SL SUBL: 0.4000 mg | SUBLINGUAL_TABLET | SUBLINGUAL | Status: DC | PRN

## 2023-06-04 MED ORDER — FLUOXETINE HCL 10 MG PO CAPS
10.0000 mg | ORAL_CAPSULE | ORAL | Status: DC
  Administered 2023-06-04: 10 mg via ORAL
  Filled 2023-06-04: qty 1

## 2023-06-04 MED ORDER — MAGNESIUM HYDROXIDE 400 MG/5ML PO SUSP: 30.0000 mL | Freq: Every day | ORAL | Status: DC | PRN

## 2023-06-04 MED ORDER — LEVOTHYROXINE SODIUM 50 MCG PO TABS
75.0000 ug | ORAL_TABLET | Freq: Every day | ORAL | Status: DC
  Administered 2023-06-04 – 2023-06-07 (×4): 75 ug via ORAL
  Filled 2023-06-04: qty 2
  Filled 2023-06-04 (×3): qty 1

## 2023-06-04 MED ORDER — ASPIRIN 325 MG PO TBEC
325.0000 mg | DELAYED_RELEASE_TABLET | Freq: Every day | ORAL | Status: DC
  Administered 2023-06-04 – 2023-06-06 (×3): 325 mg via ORAL
  Filled 2023-06-04 (×3): qty 1

## 2023-06-04 MED ORDER — INSULIN ASPART 100 UNIT/ML IJ SOLN: 0.0000 [IU] | Freq: Every day | INTRAMUSCULAR | Status: DC

## 2023-06-04 MED ORDER — ACETAMINOPHEN 650 MG RE SUPP: 650.0000 mg | Freq: Four times a day (QID) | RECTAL | Status: DC | PRN

## 2023-06-04 MED ORDER — SODIUM BICARBONATE 650 MG PO TABS
650.0000 mg | ORAL_TABLET | Freq: Two times a day (BID) | ORAL | Status: DC
  Administered 2023-06-04 – 2023-06-07 (×7): 650 mg via ORAL
  Filled 2023-06-04 (×7): qty 1

## 2023-06-04 MED ORDER — HEPARIN BOLUS VIA INFUSION
4000.0000 [IU] | Freq: Once | INTRAVENOUS | Status: AC
  Administered 2023-06-04: 4000 [IU] via INTRAVENOUS
  Filled 2023-06-04: qty 4000

## 2023-06-04 MED ORDER — ATORVASTATIN CALCIUM 80 MG PO TABS
80.0000 mg | ORAL_TABLET | Freq: Every day | ORAL | Status: DC
  Administered 2023-06-04 – 2023-06-07 (×4): 80 mg via ORAL
  Filled 2023-06-04 (×2): qty 4
  Filled 2023-06-04 (×2): qty 1

## 2023-06-04 MED ORDER — LORATADINE 10 MG PO TABS
10.0000 mg | ORAL_TABLET | Freq: Every day | ORAL | Status: DC
  Administered 2023-06-04 – 2023-06-07 (×3): 10 mg via ORAL
  Filled 2023-06-04 (×4): qty 1

## 2023-06-04 NOTE — Assessment & Plan Note (Signed)
-   We will continue diuresis with IV Lasix. - We Will follow serial troponins. - We will follow I's and O's and daily weights. - Cardiology consult will be obtained as mentioned above.   

## 2023-06-04 NOTE — ED Provider Notes (Signed)
[facility] Provider Note    Event Date/Time   First MD Initiated Contact with Patient 06/04/23 0110     (approximate)   History   Shortness of Breath   HPI  Cassandra Baker is a 82 y.o. female with past medical history of CKD, diastolic heart failure, endometrial cancer status post hysterectomy who presents today because of shortness of breath and lower extremity swelling.  Patient notes that for the last for 5 days she has had swelling of her bilateral feet and feels short of breath.  She has dyspnea on exertion and shortness of breath with laying flat.  Denies chest pain.  Denies cough fevers or chills.  No hemoptysis. The patient denies hx of prior DVT/PE, unilateral leg pain/swelling, hormone use, recent surgery, hx of cancer, prolonged immobilization, or hemoptysis.   She has history of CKD and previously had a fistula placed in anticipation of needing dialysis but has never needed it.  She has been resistant to dialysis in the past.  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  02-09-18   CHF (congestive heart failure) (HCC)    Has shortness of breath if she does not take Lasix   Chronic kidney disease    Stated it is 29% functioning   Depression    Diabetes mellitus without complication (HCC)    Not on medication   Dyspnea    Hypertension    Hypothyroidism     Patient Active Problem List   Diagnosis Date Noted   Chronic kidney disease (CKD), stage IV (severe) (HCC) 10/25/2019   Lymphadenopathy, inguinal 02/09/2018   Endometrial cancer (HCC) 01/18/2018   Hyperparathyroidism, primary (HCC) 12/07/2014   Neoplasm of uncertain behavior of thyroid gland, 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 --      Excl. in GC? --     Most recent vital signs: Vitals:   06/03/23 2352  BP: 128/63  Pulse: 95  Resp: 15  Temp: 98.2 F (36.8 C)  SpO2: 96%     General: Awake, no distress.  CV:  Good peripheral perfusion.  Ending edema in the bilateral lower extremities Resp:  Normal effort.  Lung sounds are clear Abd:  No distention.  Neuro:             Awake, Alert, Oriented x 3  Other:     ED Results / Procedures / Treatments  Labs (all labs ordered are listed, but only abnormal results are displayed)  Labs Reviewed  BASIC METABOLIC PANEL - Abnormal; Notable for the following components:      Result Value   CO2 18 (*)    Glucose, Bld 172 (*)    BUN 98 (*)    Creatinine, Ser 6.24 (*)    GFR, Estimated 6 (*)    Anion gap 16 (*)    All other components within normal limits  CBC - Abnormal; Notable for the following components:   RBC 2.81 (*)    Hemoglobin 8.5 (*)    HCT 27.6 (*)    All other components within normal limits  BRAIN NATRIURETIC PEPTIDE - Abnormal; Notable for the following components:   B Natriuretic Peptide 2,261.8 (*)    All other components within normal limits  TROPONIN I (HIGH SENSITIVITY) - Abnormal; Notable for the following components:   Troponin I (High Sensitivity) 986 (*)    All other components within normal limits  TROPONIN I (HIGH SENSITIVITY)     EKG  I reviewed and interpreted patient's EKG which shows sinus rhythm with a normal axis and intervals, <52mm ST elevation in lead III and aVF. ST depression 1 and aVL   RADIOLOGY I reviewed  interpreted patient's chest x-ray which shows increased interstitial markings at the bases   PROCEDURES:  Critical Care performed: Yes, see critical care procedure note(s)  .Critical Care  Performed by: Georga Hacking, MD Authorized by: Georga Hacking, MD   Critical care provider statement:    Critical care time (minutes):  30   Critical care was time spent personally by me on the following activities:  Development of treatment plan with patient or surrogate, discussions with consultants, evaluation of patient's response to treatment, examination of patient, ordering and review of laboratory studies, ordering and review of radiographic studies, ordering and performing treatments and interventions, pulse oximetry, re-evaluation of patient's condition and review of old charts   The patient is on the cardiac monitor to evaluate for evidence of arrhythmia and/or significant heart rate changes.   MEDICATIONS ORDERED IN ED: Medications  aspirin chewable tablet 324 mg (has no administration in time range)  furosemide (LASIX) injection 80 mg (has no administration in time range)     IMPRESSION / MDM / ASSESSMENT AND PLAN / ED COURSE  I reviewed the triage vital signs and the nursing notes.                              Patient's presentation is most consistent with acute presentation with potential threat to life or bodily function.  Differential diagnosis includes, but is not limited to, cardiorenal syndrome, worsening heart failure, ACS, pulmonary hypertension, PE  The patient is an 82 year old female with underlying CKD and prior diagnosis of diastolic heart failure though not able to see prior echo who presents today because of lower extremity swelling and dyspnea on exertion for several days.  On arrival to ED her vital signs are stable she is not hypertensive not hypoxic and actually looks quite well.  She does appear volume overloaded has pitting edema but I do not appreciate  rales on lung auscultation.  She is not having chest pain and has not had chest pain.  Patient's EKG is concerning to me she has some ST elevation inferiorly with reciprocal depression in 1 and aVL though does not quite meet STEMI criteria and patient is not having chest pain and is really not presenting like occlusive MI.  Patient's  labs are also concerning with elevated troponin to about thousand BNP over 2000 and renal failure with creatinine of 6 anion gap 16 BUN of 98.  Patient's chest x-ray has some atelectasis versus scarring at the bases but really no frank edema.  I am concerned for cardiorenal syndrome.  Will give aspirin and Lasix and start on heparin.  I did repeat patient's EKG and it looks stable.  There is less than 1 cm of ST elevation in lead III minimal ST elevation in aVF do not feel this meets STEMI criteria and clinically I am not concerned for STEMI.  Will continue medical management.       FINAL CLINICAL IMPRESSION(S) / ED DIAGNOSES   Final diagnoses:  NSTEMI (non-ST elevated myocardial infarction) (HCC)     Rx / DC Orders   ED Discharge Orders     None        Note:  This document was prepared using Dragon voice recognition software and may include unintentional dictation errors.   Georga Hacking, MD 06/04/23 0130

## 2023-06-04 NOTE — Progress Notes (Signed)
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 Continue on IV heparin drip & aspirin. Echo ordered. Nitro, morphine prn. Continue on tele. Cardio consulted.    Acute on chronic diastolic CHF: continue on IV lasix. Monitor I/Os. BNP is elevated 2,261. Cardio consulted    AKI on CKDIV: Cr is trending down slightly from day prior. Avoid nephrotoxic meds. If Cr continues to rise, will consult nephro    DM2: well controlled, HbA1c 6.9. Continue on SSI w/ accuchecks    Hypothyroidism: continue on home dose of synthroid    Depression: severity unknown. Continue on home dose of prozac   Macrocytic anemia: will check B12, folate      DVT prophylaxis: heparin  Code Status: full  Family Communication:  Disposition Plan: PT/OT consulted   Level of care: Progressive Status is: Inpatient Remains inpatient appropriate because: on IV heparin drip     Consultants:  Cardio   Procedures:   Antimicrobials:   Subjective: Pt c/o anxiety   Objective: Vitals:   06/03/23 2350 06/03/23 2352 06/04/23 0414 06/04/23 0554  BP:  128/63  124/74  Pulse:  95  88  Resp:  15  18  Temp:  98.2 F (36.8 C) 98.7 F (37.1 C) 98.4 F (36.9 C)  TempSrc:  Oral Oral Oral  SpO2:  96%  99%  Weight: 67.1 kg     Height: 5\' 4"  (1.626 m)      No intake or output data in the 24 hours ending 06/04/23 0855 Filed Weights   06/03/23 2350  Weight: 67.1 kg    Examination:  General exam: Appears restless  Respiratory system: Clear to  auscultation. Respiratory effort normal. Cardiovascular system: S1 & S2 +. No rubs, gallops or clicks.  Gastrointestinal system: Abdomen is nondistended, soft and nontender. No organomegaly or masses felt. Normal bowel sounds heard. Central nervous system: Alert and awake. Moves all extremities  Psychiatry: Judgement and insight appears not at baseline. Anxious mood and affect     Data Reviewed: I have personally reviewed following labs and imaging studies  CBC: Recent Labs  Lab 06/03/23 2353 06/04/23 0408  WBC 6.9 7.1  HGB 8.5* 8.2*  HCT 27.6* 27.3*  MCV 98.2 100.0  PLT 227 216   Basic Metabolic Panel: Recent Labs  Lab 06/03/23 2353 06/04/23 0408  NA 141 141  K 4.8 4.5  CL 107 108  CO2 18* 17*  GLUCOSE 172* 124*  BUN 98* 99*  CREATININE 6.24* 6.11*  CALCIUM 9.5 9.5   GFR: Estimated Creatinine Clearance: 6.7 mL/min (A) (by C-G formula based on SCr of 6.11 mg/dL (H)). Liver Function Tests: No results for input(s): "AST", "ALT", "ALKPHOS", "BILITOT", "PROT", "ALBUMIN" in the last 168 hours. No results for input(s): "LIPASE", "AMYLASE" in the last 168 hours. No results for input(s): "AMMONIA" in the last 168 hours. Coagulation Profile: Recent Labs  Lab 06/04/23 0145  INR 1.2   Cardiac Enzymes: No results for input(s): "CKTOTAL", "CKMB", "CKMBINDEX", "TROPONINI" in the last 168 hours. BNP (last 3 results) No  results for input(s): "PROBNP" in the last 8760 hours. HbA1C: No results for input(s): "HGBA1C" in the last 72 hours. CBG: Recent Labs  Lab 06/04/23 0734  GLUCAP 112*   Lipid Profile: No results for input(s): "CHOL", "HDL", "LDLCALC", "TRIG", "CHOLHDL", "LDLDIRECT" in the last 72 hours. Thyroid Function Tests: Recent Labs    06/04/23 0408  TSH 2.976   Anemia Panel: No results for input(s): "VITAMINB12", "FOLATE", "FERRITIN", "TIBC", "IRON", "RETICCTPCT" in the last 72 hours. Sepsis Labs: No results for input(s): "PROCALCITON", "LATICACIDVEN" in  the last 168 hours.  No results found for this or any previous visit (from the past 240 hour(s)).       Radiology Studies: DG Chest 1 View  Result Date: 06/04/2023 CLINICAL DATA:  Shortness of breath EXAM: CHEST  1 VIEW COMPARISON:  03/11/2018 FINDINGS: There is hyperinflation of the lungs compatible with COPD. Increased markings in the lung bases. No effusions or pneumothorax. Heart mediastinal contours within normal limits. Aortic atherosclerosis. IMPRESSION: COPD. Increased markings in the lung bases could reflect atelectasis or scarring. Electronically Signed   By: Charlett Nose M.D.   On: 06/04/2023 00:34        Scheduled Meds:  aspirin EC  325 mg Oral QHS   atorvastatin  80 mg Oral Daily   calcitRIOL  0.5 mcg Oral Daily   ferrous sulfate  325 mg Oral Q1200   FLUoxetine  10 mg Oral Once per day on Mon Wed Fri   furosemide  40 mg Intravenous BID   insulin aspart  0-15 Units Subcutaneous TID WC   insulin aspart  0-5 Units Subcutaneous QHS   levothyroxine  75 mcg Oral Q0600   linagliptin  5 mg Oral Daily   loratadine  10 mg Oral Daily   sodium bicarbonate  650 mg Oral BID   Continuous Infusions:  heparin 850 Units/hr (06/04/23 0154)     LOS: 0 days    Time spent: 35 mins     Charise Killian, MD [facility] Pager [phone]  If 7PM-7AM, please contact night-coverage [email or web address] 06/04/2023, 8:55 AM

## 2023-06-04 NOTE — Assessment & Plan Note (Addendum)
-   The patient was admitted to a progressive unit bed. - We will continue our IV heparin. - Will be placed on aspirin as well as as needed sublingual nitroglycerin and IV morphine sulfate for pain.  She is currently pain-free. - She will be placed on high-dose statin.  Will hold off beta-blocker therapy for now given acute CHF. - 2D echo and cardiology consult will be obtained. - I notified [facility] group regarding the patient.

## 2023-06-04 NOTE — ED Notes (Signed)
Pt ambulated to the restroom independently with a steady gait. Ordered lunch tray for patient.

## 2023-06-04 NOTE — ED Notes (Signed)
Pt ambulated to BR with steady gait.

## 2023-06-04 NOTE — Progress Notes (Signed)
ANTICOAGULATION CONSULT NOTE  Pharmacy Consult for heparin infusion Indication: ACS/STEMI  Allergies  Allergen Reactions   Plum Pulp Anaphylaxis and Cough   Citrus     Coughing, chest tightness    Egg-Derived Products Itching    congestion   Other Itching    Dairy products/Soaps & detergents   Penicillins Rash    Has patient had a PCN reaction causing immediate rash, facial/tongue/throat swelling, SOB or lightheadedness with hypotension: Yes Has patient had a PCN reaction causing severe rash involving mucus membranes or skin necrosis: No Has patient had a PCN reaction that required hospitalization: No Has patient had a PCN reaction occurring within the last 10 years: No If all of the above answers are "NO", then may proceed with Cephalosporin use.     Patient Measurements: Height: 5\' 4"  (162.6 cm) Weight: 67.1 kg (148 lb) IBW/kg (Calculated) : 54.7 Heparin Dosing Weight: 67.1 kg  Vital Signs: Temp: 98.6 F (37 C) (06/13 1009) Temp Source: Oral (06/13 1009) BP: 124/73 (06/13 1009) Pulse Rate: 86 (06/13 1009)  Labs: Recent Labs    06/03/23 2353 06/04/23 0145 06/04/23 0408  HGB 8.5*  --  8.2*  HCT 27.6*  --  27.3*  PLT 227  --  216  APTT  --  35  --   LABPROT  --  15.0  --   INR  --  1.2  --   CREATININE 6.24*  --  6.11*  TROPONINIHS 986* 998*  --      Estimated Creatinine Clearance: 6.7 mL/min (A) (by C-G formula based on SCr of 6.11 mg/dL (H)).   Medical History: Past Medical History:  Diagnosis Date   Anemia    Arthritis    low back,knees   Asthma    Basal cell carcinoma (BCC)    a. nose   CHF (congestive heart failure) (HCC)    a. prev eval by Dr. Katrinka Blazing in [place] - "many yrs ago."   CKD (chronic kidney disease), stage IV (HCC)    Depression    Diabetes mellitus without complication (HCC)    Dyspnea    Hypertension    Hypothyroidism     Assessment: Pt is a 82 yo female presenting to ED c/o lower extremity swelling & SOB, found elevated Troponin  I lvl. Patient was found to have an NSTEMI, acute CHF, and AKI. Patient has a history of CHF, CKD and DM. Hemoglobin and platelets are stable.  6/13 0948 HL 0.15, subtherapeutic   Goal of Therapy:  Heparin level 0.3-0.7 units/ml Monitor platelets by anticoagulation protocol: Yes   Plan:  Give heparin 2000 units bolus Increase heparin infusion to 1100 units/hr Check HL in 8 hr after dose adjustment CBC daily while on heparin per protocol  Francetta Found, PharmD Candidate Class of 2025  06/04/2023 10:17 AM

## 2023-06-04 NOTE — Assessment & Plan Note (Signed)
-   We will continue Synthroid. 

## 2023-06-04 NOTE — Consult Note (Signed)
Cardiology Consult    Patient ID: JNAE DONATE MRN: [id number], DOB/AGE: [birth year]   Admit date: 06/04/2023 Date of Consult: 06/04/2023  Primary Physician: Renford Dills, MD Primary Cardiologist: Julien Nordmann, MD - new Requesting Provider: Andrez Grime, MD  Patient Profile    Cassandra Baker is a 82 y.o. female with a history of HTN, CKD IV, DM, anemia, hypothyroidism, depression, asthma, and OA, who is being seen today for the evaluation of dypsnea, abnl ECG, and elevated hsTrops, in the setting of acute on chronic renal failure at the request of Dr. Arville Care.  Past Medical History   Past Medical History:  Diagnosis Date   Anemia    Arthritis    low back,knees   Asthma    Basal cell carcinoma (BCC)    a. nose   CHF (congestive heart failure) (HCC)    a. prev eval by Dr. Katrinka Blazing in [place] - "many yrs ago."   CKD (chronic kidney disease), stage IV (HCC)    Depression    Diabetes mellitus without complication (HCC)    Dyspnea    Hypertension    Hypothyroidism     Past Surgical History:  Procedure Laterality Date   APPENDECTOMY     4 th grade   AV FISTULA PLACEMENT Left 05/12/2019   Procedure: BRACHIOCEPHALIC ARTERIOVENOUS (AV) FISTULA CREATION LEFT ARM;  Surgeon: Nada Libman, MD;  Location: [facility] OR;  Service: Vascular;  Laterality: Left;   EYE SURGERY  2014   Bilateral 6 mos. apart  cataracts   FISTULA SUPERFICIALIZATION Left 10/06/2019   Procedure: ELEVATION OF ARTERIOVENOUS FISTULA LEFT ARM;  Surgeon: Nada Libman, MD;  Location: [facility] OR;  Service: Vascular;  Laterality: Left;   LYMPH NODE BIOPSY N/A 02/09/2018   Procedure: SENTINEL LYMPH NODE BIOPSY;  Surgeon: Adolphus Birchwood, MD;  Location: [facility];  Service: Gynecology;  Laterality: N/A;   PARATHYROIDECTOMY N/A 12/07/2014   Procedure: PARATHYROIDECTOMY;  Surgeon: Darnell Level, MD;  Location: [facility];  Service: General;  Laterality: N/A;   ROBOTIC ASSISTED TOTAL HYSTERECTOMY WITH BILATERAL SALPINGO OOPHERECTOMY Bilateral  02/09/2018   Procedure: XI ROBOTIC ASSISTED TOTAL HYSTERECTOMY WITH BILATERAL SALPINGO OOPHORECTOMY;  Surgeon: Adolphus Birchwood, MD;  Location: [facility];  Service: Gynecology;  Laterality: Bilateral;   THYROID LOBECTOMY Left 12/07/2014   Procedure: LEFT THYROID LOBECTOMY;  Surgeon: Darnell Level, MD;  Location: [facility];  Service: General;  Laterality: Left;   TONSILLECTOMY     TUBAL LIGATION     Early thirties     Allergies  Allergies  Allergen Reactions   Plum Pulp Anaphylaxis and Cough   Citrus     Coughing, chest tightness    Egg-Derived Products Itching    congestion   Other Itching    Dairy products/Soaps & detergents   Penicillins Rash    Has patient had a PCN reaction causing immediate rash, facial/tongue/throat swelling, SOB or lightheadedness with hypotension: Yes Has patient had a PCN reaction causing severe rash involving mucus membranes or skin necrosis: No Has patient had a PCN reaction that required hospitalization: No Has patient had a PCN reaction occurring within the last 10 years: No If all of the above answers are "NO", then may proceed with Cephalosporin use.     History of Present Illness    82 y/o ? w/ a h/o HTN, CKD IV, DM, anemia, hypothyroidism, depression, asthma, and OA.  Notes that she was prev eval by Dr. Katrinka Blazing when he was w/ [facility] in [place], but it  was many years ago, and she doesn't remember why she was evaluated.  Over past year, inc DOE - reduced activity.  Occas, vague discomfort in chest - so infrequent she can't really say how often it happens or how long it lasts.  Over the past 2 wks, she has noted an  inc in lower ext edema and doe.  She takes lasix @ home but noted a drop off in UO @ home.  On 6/13, she noted more profound DOE, saying that she went to [facility] and became very short of breath after walking only 2 car lengths (no cp). Due to profound progression of symptoms, she presented to the ED in the evening.  Here, she was hemodynamically stable.   Initial ECG showed sinus rhythm @ 96, inf infarct, subtle inf/anterolateral ST elev.  hsTrop 986  998.  BNP 2261.8.  BUN 98, creat 6.245 (88/4.20 in 09/2019).  H/H 8.5/27.6.  CXR w/ inc lung markings in bases - ? Atx/scarring.  Given lasix 80 IV x 1 w/ good response per pt.  ASA, heparin, statin started.  Currently feels well.  Lower ext edema improved.  Inpatient Medications     aspirin EC  325 mg Oral QHS   atorvastatin  80 mg Oral Daily   calcitRIOL  0.5 mcg Oral Daily   ferrous sulfate  325 mg Oral Q1200   FLUoxetine  10 mg Oral Once per day on Mon Wed Fri   furosemide  40 mg Intravenous BID   insulin aspart  0-15 Units Subcutaneous TID WC   insulin aspart  0-5 Units Subcutaneous QHS   levothyroxine  75 mcg Oral Q0600   loratadine  10 mg Oral Daily   sodium bicarbonate  650 mg Oral BID    Family History    Family History  Problem Relation Age of Onset   Hypertension Mother        MI's beginning in 74's, died @ 2   Heart disease Mother    Hypertension Father    Heart disease Father    Heart disease Brother    Stroke Brother    Cervical cancer Maternal Aunt    Heart disease Brother    Stroke Brother    She indicated that her mother is deceased. She indicated that her father is deceased. She indicated that both of her brothers are deceased. She indicated that the status of her maternal aunt is unknown.   Social History    Social History   Socioeconomic History   Marital status: Married    Spouse name: Not on file   Number of children: Not on file   Years of education: Not on file   Highest education level: Not on file  Occupational History   Not on file  Tobacco Use   Smoking status: Never   Smokeless tobacco: Never  Vaping Use   Vaping Use: Never used  Substance and Sexual Activity   Alcohol use: No   Drug use: No   Sexual activity: Not Currently    Birth control/protection: Post-menopausal  Other Topics Concern   Not on file  Social History Narrative    Lives in [place].  Does not routinely exercise.   Social Determinants of Health   Financial Resource Strain: Not on file  Food Insecurity: Not on file  Transportation Needs: Not on file  Physical Activity: Not on file  Stress: Not on file  Social Connections: Not on file  Intimate Partner Violence: Not on file  Review of Systems    General:  No chills, fever, night sweats or weight changes.  Cardiovascular:  occas/rare chest pain, +++ dyspnea on exertion, +++ edema, no orthopnea, palpitations, paroxysmal nocturnal dyspnea. Dermatological: No rash, lesions/masses Respiratory: No cough, +++ dyspnea Urologic: No hematuria, dysuria Abdominal:   No nausea, vomiting, diarrhea, bright red blood per rectum, melena, or hematemesis Neurologic:  No visual changes, wkns, changes in mental status. All other systems reviewed and are otherwise negative except as noted above.  Physical Exam    Blood pressure 124/73, pulse 86, temperature 98.6 F (37 C), temperature source Oral, resp. rate 18, height 5\' 4"  (1.626 m), weight 67.1 kg, SpO2 97 %.  General: Pleasant, NAD Psych: Normal affect. Neuro: Alert and oriented X 3. Moves all extremities spontaneously. HEENT: Normal  Neck: Supple without bruits.  JVD to jaw. Lungs:  Resp regular and unlabored, CTA. Heart: RRR, tachy, occas ectopy, no s3, s4, or murmurs. Abdomen: Soft, non-tender, non-distended, BS + x 4.  Extremities: No clubbing, cyanosis.  Trace bilat ankle/pedal edema. DP/PT2+, Radials 2+ and equal bilaterally.  Labs    Cardiac Enzymes Recent Labs  Lab 06/03/23 2353 06/04/23 0145  TROPONINIHS 986* 998*     BNP    Component Value Date/Time   BNP 2,261.8 (H) 06/03/2023 2353   Lab Results  Component Value Date   WBC 7.1 06/04/2023   HGB 8.2 (L) 06/04/2023   HCT 27.3 (L) 06/04/2023   MCV 100.0 06/04/2023   PLT 216 06/04/2023    Recent Labs  Lab 06/04/23 0408  NA 141  K 4.5  CL 108  CO2 17*  BUN 99*  CREATININE  6.11*  CALCIUM 9.5  GLUCOSE 124*    Radiology Studies    DG Chest 1 View  Result Date: 06/04/2023 CLINICAL DATA:  Shortness of breath EXAM: CHEST  1 VIEW COMPARISON:  03/11/2018 FINDINGS: There is hyperinflation of the lungs compatible with COPD. Increased markings in the lung bases. No effusions or pneumothorax. Heart mediastinal contours within normal limits. Aortic atherosclerosis. IMPRESSION: COPD. Increased markings in the lung bases could reflect atelectasis or scarring. Electronically Signed   By: Charlett Nose M.D.   On: 06/04/2023 00:34    ECG & Cardiac Imaging    6/12 - RSR, 96, subtle inferior and anterolateral ST elevation, inf infarct - personally reviewed.  Assessment & Plan    1.  Acute CHF:  1 yr h/o progressive DOE that has really worsened over the past 2 wks and has become assoc w/ lower ext edema.  She is on daily lasix @ home in the setting of CKD IV, and usually notes good response, but not so in the past week or so, w/ reduced UO.  Due to profound DOE on 6/13, she presented to the ED.  Presenting ECG notable for sinus rhythm @ 96, inf infarct, subtle inf/anterolateral ST elev (no c/p).  hsTrop 986  998.  BNP 2261.8.  BUN 98, creat 6.245 (88/4.20 in 09/2019).  H/H 8.5/27.6.  CXR w/ inc lung markings in bases - ? Atx/scarring.  She has responded well to IV lasix.  Still w/ mild JVD and lower ext edema, though she notes significant improvement in the latter.  Creat this AM sl better @ 6.11.  Echo pending.  Cont IV lasix.  Nephrology consult pending.  Further recs pending echo and status of renal fxn re: isch eval and meds.  2.  Acute on chronic renal failure:  h/o CKD IV.  Presented w/  CHF and renal failure w/ creat 6.245 as outline above.  Nephrology c/s pending.  Follow w/ diuresis.  3.  Essential HTN:  stable.  Follow.  4.  Demand Ischemia vs NSTEMI:  Pt notes progressive DOE over past year, worse in past week.  Though she has occas had "chest discomfort," which she says is  rare and can't really describe further, she did not have any c/p prior to admission.  ECG on arrival abnl w/ subtle inflat ST elev and Q's in III, aVF, V3-V6.  ECG 6/13 w/ resolution of changes.  As above, echo pending.  Further recs re: ischemic eval pending EF and renal fxn.  Cont heparin, asa, statin.  Provided EF ok, will look to add ? blocker.  5.  HL:  cont statin.  6.  Normocytic anemia:  in setting of #2.  Follow.  7. DMII:  per IM. Jardiance on hold in setting of renal failure.  Risk Assessment/Risk Scores:     TIMI Risk Score for Unstable Angina or Non-ST Elevation MI:   The patient's TIMI risk score is 4, which indicates a 20% risk of all cause mortality, new or recurrent myocardial infarction or need for urgent revascularization in the next 14 days.  New York Heart Association (NYHA) Functional Class NYHA Class III    Signed, Nicolasa Ducking, NP 06/04/2023, 12:00 PM  For questions or updates, please contact   Please consult [email or web address] for contact info under Cardiology/STEMI.

## 2023-06-04 NOTE — Progress Notes (Addendum)
ANTICOAGULATION CONSULT NOTE  Pharmacy Consult for Heparin Infusion Indication: ACS/STEMI  Patient Measurements: Height: 5\' 4"  (162.6 cm) Weight: 67.1 kg (148 lb) IBW/kg (Calculated) : 54.7 Heparin Dosing Weight: 67.1 kg  Labs: Recent Labs    06/03/23 2353 06/04/23 0145 06/04/23 0408 06/04/23 0948 06/04/23 2026  HGB 8.5*  --  8.2*  --   --   HCT 27.6*  --  27.3*  --   --   PLT 227  --  216  --   --   APTT  --  35  --   --   --   LABPROT  --  15.0  --   --   --   INR  --  1.2  --   --   --   HEPARINUNFRC  --   --   --  0.15* 0.61  CREATININE 6.24*  --  6.11*  --   --   TROPONINIHS 986* 998*  --   --   --     Estimated Creatinine Clearance: 6.7 mL/min (A) (by C-G formula based on SCr of 6.11 mg/dL (H)).  Medical History: Past Medical History:  Diagnosis Date   Anemia    Arthritis    low back,knees   Asthma    Basal cell carcinoma (BCC)    a. nose   CHF (congestive heart failure) (HCC)    a. prev eval by Dr. Katrinka Blazing in [place] - "many yrs ago."   CKD (chronic kidney disease), stage IV (HCC)    Depression    Diabetes mellitus without complication (HCC)    Dyspnea    Hypertension    Hypothyroidism     Assessment: Pt is a 82 yo female presenting to ED c/o lower extremity swelling & SOB, found elevated Troponin I lvl. Patient was found to have an NSTEMI, acute CHF, and AKI. Patient has a history of CHF, CKD and DM. Hemoglobin and platelets are stable.  5621 0948 HL 0.15, subtherapeutic; 850 un/hr 0613 2026 HL 0.61, therapeutic x 1; 1100 un/hr  Goal of Therapy:  Heparin level 0.3-0.7 units/ml Monitor platelets by anticoagulation protocol: Yes   Plan:  --Heparin level is therapeutic x 1 --Continue heparin infusion at 1100 units/hr --Re-check HL in 8 hours --Daily CBC per protocol while on IV heparin  Tressie Ellis 06/04/2023 9:42 PM

## 2023-06-04 NOTE — ED Notes (Signed)
Cassandra Baker.  Pt son may be reached at [phone]./ Pt daughter in-law margaret wolf L[place] [phone].   Son is here and has witnessed tele sitter reminding pt to not get up and he requests that tele sitter be removed and his mother be allowed to stand up as she would like.  He states that he is certain that she does not have any cognitive impairment and that she just likes to move and fidget.  He states that she is ambulatory and independent at home.  I advised that I am concerned about her getting up and tripping with IV and cables but son states that he is not concerned as "her home is more cluttered".  I advised son that pt had lasix and might feel the need to get up and the tele sitter would be able to call me if pt needed anything and there might be wait times while nursing staff is with other patients, son acknowledges this and still does not want tele sitter.  Called and cancelled tele sitter.   Pt has been helped to the bathroom to void several times.

## 2023-06-04 NOTE — ED Notes (Signed)
Pt ambulates to the bathroom to void.  

## 2023-06-04 NOTE — Assessment & Plan Note (Signed)
-   This is superimposed on CKD 4. - This is likely prerenal due to her acute CHF. - Will follow BMP with diuresis. - Would avoid nephrotoxins.

## 2023-06-04 NOTE — Assessment & Plan Note (Signed)
-   We will place on supplemental coverage with NovoLog and resume Januvia.

## 2023-06-04 NOTE — ED Notes (Signed)
Pt becoming increasingly confused, requiring frequent redirection. Pt seen walking down hallway with IV pole and telemetry leads in hand. Pt forgetful, wandering in halls looking for her car. Pt redirectable at this time. Order placed for safety sitter/tele sitter. Pt verbalized understanding, displays no evidence of learning. Charge RN and MD aware.

## 2023-06-04 NOTE — ED Notes (Signed)
Pt denies any CP or sob.  She mentions that she feels much better.  Family is here visiting pt at this time

## 2023-06-04 NOTE — H&P (Signed)
[facility]   PATIENT NAME: Cassandra Baker    MR#:  [id number]  DATE OF BIRTH:  1941/10/08  DATE OF ADMISSION:  06/04/2023  PRIMARY CARE PHYSICIAN: Renford Dills, MD   Patient is coming from: Home  REQUESTING/REFERRING PHYSICIAN: Georga Hacking, MD  CHIEF COMPLAINT:   Chief Complaint  Patient presents with   Shortness of Breath    HISTORY OF PRESENT ILLNESS:  Cassandra Baker is a 82 y.o. female with medical history significant for osteoarthritis, asthma, CHF, CKD, depression, type diabetes mellitus, hypertension and hypothyroidism who presented to the emergency room with acute onset worsening dyspnea with associated lower extremity edema as well as orthopnea and paroxysmal nocturnal dyspnea.  She has been having occasional nausea without vomiting or diarrhea or abdominal pain.  No chest pain or palpitations.  She admits to mild urinary urgency without dysuria or hematuria or flank pain.  No cough or wheezing or hemoptysis.  ED Course: When the patient came to the ER vital signs were within normal.  Labs revealed a blood glucose of 172 and BUN at 98 with a creatinine of 6.24 compared to 88/4.2 on 10/06/2019.-State troponin I was 986 and later 998.  BNP was 20-61.8.  CBC showed anemia with hemoglobin 8.5 hematocrit 27.6 worse than previous levels. EKG as reviewed by me : Normal sinus rhythm with a rate of 96 with Q waves inferiorly and laterally. Imaging: Portable chest x-ray showed increased markings in the lung bases that could reflect atelectasis or scarring.  The patient was given IV heparin bolus and drip, 4 of aspirin and 80 mg of IV Lasix.  She will be admitted to a progressive unit bed for further evaluation and management. PAST MEDICAL HISTORY:   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  02-09-18   CHF (congestive heart failure) (HCC)    Has  shortness of breath if she does not take Lasix   Chronic kidney disease    Stated it is 29% functioning   Depression    Diabetes mellitus without complication (HCC)    Not on medication   Dyspnea    Hypertension    Hypothyroidism     PAST SURGICAL HISTORY:   Past Surgical History:  Procedure Laterality Date   APPENDECTOMY     4 th grade   AV FISTULA PLACEMENT Left 05/12/2019   Procedure: BRACHIOCEPHALIC ARTERIOVENOUS (AV) FISTULA CREATION LEFT ARM;  Surgeon: Nada Libman, MD;  Location: [facility] OR;  Service: Vascular;  Laterality: Left;   EYE SURGERY  2014   Bilateral 6 mos. apart  cataracts   FISTULA SUPERFICIALIZATION Left 10/06/2019   Procedure: ELEVATION OF ARTERIOVENOUS FISTULA LEFT ARM;  Surgeon: Nada Libman, MD;  Location: [facility] OR;  Service: Vascular;  Laterality: Left;   LYMPH NODE BIOPSY N/A 02/09/2018   Procedure: SENTINEL LYMPH NODE BIOPSY;  Surgeon: Adolphus Birchwood, MD;  Location: [facility];  Service: Gynecology;  Laterality: N/A;   PARATHYROIDECTOMY N/A 12/07/2014   Procedure: PARATHYROIDECTOMY;  Surgeon: Darnell Level, MD;  Location: [facility];  Service: General;  Laterality: N/A;   ROBOTIC ASSISTED TOTAL HYSTERECTOMY WITH BILATERAL SALPINGO OOPHERECTOMY Bilateral 02/09/2018   Procedure: XI ROBOTIC ASSISTED TOTAL HYSTERECTOMY WITH BILATERAL SALPINGO OOPHORECTOMY;  Surgeon: Adolphus Birchwood, MD;  Location: [facility];  Service: Gynecology;  Laterality: Bilateral;  THYROID LOBECTOMY Left 12/07/2014   Procedure: LEFT THYROID LOBECTOMY;  Surgeon: Darnell Level, MD;  Location: [facility];  Service: General;  Laterality: Left;   TONSILLECTOMY     TUBAL LIGATION     Early thirties    SOCIAL HISTORY:   Social History   Tobacco Use   Smoking status: Never   Smokeless tobacco: Never  Substance Use Topics   Alcohol use: No    FAMILY HISTORY:   Family History  Problem Relation Age of Onset   Hypertension Mother    Heart disease Mother    Hypertension Father    Heart disease Father     Cervical cancer Maternal Aunt     DRUG ALLERGIES:   Allergies  Allergen Reactions   Plum Pulp Anaphylaxis and Cough   Citrus     Coughing, chest tightness    Egg-Derived Products Itching    congestion   Other Itching    Dairy products/Soaps & detergents   Penicillins Rash    Has patient had a PCN reaction causing immediate rash, facial/tongue/throat swelling, SOB or lightheadedness with hypotension: Yes Has patient had a PCN reaction causing severe rash involving mucus membranes or skin necrosis: No Has patient had a PCN reaction that required hospitalization: No Has patient had a PCN reaction occurring within the last 10 years: No If all of the above answers are "NO", then may proceed with Cephalosporin use.     REVIEW OF SYSTEMS:   ROS As per history of present illness. All pertinent systems were reviewed above. Constitutional, HEENT, cardiovascular, respiratory, GI, GU, musculoskeletal, neuro, psychiatric, endocrine, integumentary and hematologic systems were reviewed and are otherwise negative/unremarkable except for positive findings mentioned above in the HPI.   MEDICATIONS AT HOME:   Prior to Admission medications   Medication Sig Start Date End Date Taking? Authorizing Provider  acetaminophen (TYLENOL) 500 MG tablet Take 500-1,000 mg by mouth every 6 (six) hours as needed (pain).     [provider]  allopurinol (ZYLOPRIM) 100 MG tablet Take 100 mg by mouth every evening.    [provider]  aspirin 325 MG EC tablet Take 325 mg by mouth at bedtime.    [provider]  calcitRIOL (ROCALTROL) 0.5 MCG capsule Take 0.5 mcg by mouth daily.  04/01/19   [provider]  CALCIUM PO Take 1 tablet by mouth daily at 12 noon.     [provider]  EUTHYROX 100 MCG tablet Take 100 mcg by mouth daily before breakfast. 09/16/19   [provider]  Ferrous Sulfate (IRON PO) Take 1 tablet by mouth daily at 12 noon.     [provider]  fexofenadine (ALLEGRA) 60 MG tablet Take 60 mg by mouth daily as needed for allergies or rhinitis.    [provider]  FLUoxetine (PROZAC) 10 MG capsule Take 10 mg by mouth 3 (three) times a week. At night    [provider]  furosemide (LASIX) 40 MG tablet Take 80 mg by mouth 2 (two) times daily.     [provider]  HYDROcodone-acetaminophen (NORCO) 5-325 MG tablet Take 1 tablet by mouth every 6 (six) hours as needed for moderate pain. 10/06/19   Lars Mage, PA-C  hydrOXYzine (ATARAX/VISTARIL) 10 MG tablet Take 10 mg by mouth at bedtime as needed for itching.    [provider]  sitaGLIPtin (JANUVIA) 25 MG tablet Take 25 mg by mouth daily.    [provider]  sodium bicarbonate 650  MG tablet Take 650 mg by mouth 2 (two) times daily. 04/26/19   [provider]      VITAL SIGNS:  Blood pressure 128/63, pulse 95, temperature 98.7 F (37.1 C), temperature source Oral, resp. rate 15, height 5\' 4"  (1.626 m), weight 67.1 kg, SpO2 96 %.  PHYSICAL EXAMINATION:  Physical Exam  GENERAL:  82 y.o.-year-old Caucasian female patient lying in the bed with no acute distress.  EYES: Pupils equal, round, reactive to light and accommodation. No scleral icterus. Extraocular muscles intact.  HEENT: Head atraumatic, normocephalic. Oropharynx and nasopharynx clear.  NECK:  Supple, no jugular venous distention. No thyroid enlargement, no tenderness.  LUNGS: Slightly diminished bibasal breath sounds with bibasilar rales.. No use of accessory muscles of respiration.  CARDIOVASCULAR: Regular rate and rhythm, S1, S2 normal. No murmurs, rubs, or gallops.  ABDOMEN: Soft, nondistended, nontender. Bowel sounds present. No organomegaly or mass.  EXTREMITIES: 1+ bilateral lower extremity pitting edema with no cyanosis, or clubbing.  NEUROLOGIC: Cranial nerves II through XII are intact. Muscle strength 5/5 in all extremities. Sensation intact. Gait not  checked.  PSYCHIATRIC: The patient is alert and oriented x 3.  Normal affect and good eye contact. SKIN: No obvious rash, lesion, or ulcer.   LABORATORY PANEL:   CBC Recent Labs  Lab 06/04/23 0408  WBC 7.1  HGB 8.2*  HCT 27.3*  PLT 216   ------------------------------------------------------------------------------------------------------------------  Chemistries  Recent Labs  Lab 06/04/23 0408  NA 141  K 4.5  CL 108  CO2 17*  GLUCOSE 124*  BUN 99*  CREATININE 6.11*  CALCIUM 9.5   ------------------------------------------------------------------------------------------------------------------  Cardiac Enzymes No results for input(s): "TROPONINI" in the last 168 hours. ------------------------------------------------------------------------------------------------------------------  RADIOLOGY:  DG Chest 1 View  Result Date: 06/04/2023 CLINICAL DATA:  Shortness of breath EXAM: CHEST  1 VIEW COMPARISON:  03/11/2018 FINDINGS: There is hyperinflation of the lungs compatible with COPD. Increased markings in the lung bases. No effusions or pneumothorax. Heart mediastinal contours within normal limits. Aortic atherosclerosis. IMPRESSION: COPD. Increased markings in the lung bases could reflect atelectasis or scarring. Electronically Signed   By: Charlett Nose M.D.   On: 06/04/2023 00:34      IMPRESSION AND PLAN:  Assessment and Plan: * NSTEMI (non-ST elevated myocardial infarction) [facility]) - The patient was admitted to a progressive unit bed. - We will continue our IV heparin. - Will be placed on aspirin as well as as needed sublingual nitroglycerin and IV morphine sulfate for pain.  She is currently pain-free. - 2D echo and cardiology consult will be obtained. - I notified [facility] group regarding the patient.  Acute on chronic diastolic CHF (congestive heart failure) (HCC) - We will continue diuresis with IV Lasix. - We Will follow serial troponins. - We will follow I's and  O's and daily weights. -Cardiology consult will be obtained as mentioned above.   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 - This is likely prerenal due to her acute CHF. - Will follow BMP with diuresis. - Would avoid nephrotoxins.  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  Hypothyroidism - We will continue Synthroid.  Depression - We will continue Prozac.       DVT prophylaxis: IV heparin Advanced Care Planning:  Code Status: full code. Family Communication:  The plan of care was discussed in details with the patient (and  family). I answered all questions. The patient agreed to proceed with the above mentioned plan. Further management will depend upon hospital course. Disposition Plan: Back to previous home environment Consults called: Cardiology All the records are reviewed and case discussed with ED provider.  Status is: Inpatient   At the time of the admission, it appears that the appropriate admission status for this patient is inpatient.  This is judged to be reasonable and necessary in order to provide the required intensity of service to ensure the patient's safety given the presenting symptoms, physical exam findings and initial radiographic and laboratory data in the context of comorbid conditions.  The patient requires inpatient status due to high intensity of service, high risk of further deterioration and high frequency of surveillance required.  I certify that at the time of admission, it is my clinical judgment that the patient will require inpatient hospital care extending more than 2 midnights.                            Dispo: The patient is from: Home              Anticipated d/c is to: Home              Patient currently is not medically stable to d/c.              Difficult to place patient:  No  Hannah Beat M.D on 06/04/2023 at 5:20 AM  [facility]   From 7 PM-7 AM, contact night-coverage [email or web address]  CC: Primary care physician; Renford Dills, MD

## 2023-06-04 NOTE — Assessment & Plan Note (Signed)
-   We will continue Prozac. 

## 2023-06-04 NOTE — Progress Notes (Signed)
ANTICOAGULATION CONSULT NOTE  Pharmacy Consult for heparin infusion Indication: ACS/STEMI  Allergies  Allergen Reactions   Plum Pulp Anaphylaxis and Cough   Citrus     Coughing, chest tightness    Egg-Derived Products Itching    congestion   Other Itching    Dairy products/Soaps & detergents   Penicillins Rash    Has patient had a PCN reaction causing immediate rash, facial/tongue/throat swelling, SOB or lightheadedness with hypotension: Yes Has patient had a PCN reaction causing severe rash involving mucus membranes or skin necrosis: No Has patient had a PCN reaction that required hospitalization: No Has patient had a PCN reaction occurring within the last 10 years: No If all of the above answers are "NO", then may proceed with Cephalosporin use.     Patient Measurements: Height: 5\' 4"  (162.6 cm) Weight: 67.1 kg (148 lb) IBW/kg (Calculated) : 54.7 Heparin Dosing Weight: 67.1 kg  Vital Signs: Temp: 98.2 F (36.8 C) (06/12 2352) Temp Source: Oral (06/12 2352) BP: 128/63 (06/12 2352) Pulse Rate: 95 (06/12 2352)  Labs: Recent Labs    06/03/23 2353  HGB 8.5*  HCT 27.6*  PLT 227  CREATININE 6.24*  TROPONINIHS 986*    Estimated Creatinine Clearance: 6.6 mL/min (A) (by C-G formula based on SCr of 6.24 mg/dL (H)).   Medical History: 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  02-09-18   CHF (congestive heart failure) (HCC)    Has shortness of breath if she does not take Lasix   Chronic kidney disease    Stated it is 29% functioning   Depression    Diabetes mellitus without complication (HCC)    Not on medication   Dyspnea    Hypertension    Hypothyroidism     Assessment: Pt is a 82 yo female presenting to ED c/o lower extremity swelling & SOB, found elevated Troponin I lvl.  Goal of Therapy:  Heparin level 0.3-0.7 units/ml Monitor  platelets by anticoagulation protocol: Yes   Plan:  Bolus 4000 units x 1 Start heparin infusion at 850 units/hr Will check HL in 8 hr after start of infusion CBC daily while on heparin  Otelia Sergeant, PharmD, [facility] 06/04/2023 1:41 AM

## 2023-06-04 NOTE — Progress Notes (Signed)
*  PRELIMINARY RESULTS* Echocardiogram 2D Echocardiogram has been performed.  [name] 06/04/2023, 9:00 AM

## 2023-06-05 ENCOUNTER — Inpatient Hospital Stay: Payer: Medicare Other

## 2023-06-05 ENCOUNTER — Other Ambulatory Visit: Payer: Self-pay

## 2023-06-05 ENCOUNTER — Inpatient Hospital Stay (HOSPITAL_COMMUNITY): Payer: Medicare Other

## 2023-06-05 DIAGNOSIS — I214 Non-ST elevation (NSTEMI) myocardial infarction: Secondary | ICD-10-CM

## 2023-06-05 DIAGNOSIS — R410 Disorientation, unspecified: Secondary | ICD-10-CM

## 2023-06-05 LAB — CBC
HCT: 29.6 % — ABNORMAL LOW (ref 36.0–46.0)
Hemoglobin: 9.2 g/dL — ABNORMAL LOW (ref 12.0–15.0)
MCH: 30.3 pg (ref 26.0–34.0)
MCHC: 31.1 g/dL (ref 30.0–36.0)
MCV: 97.4 fL (ref 80.0–100.0)
Platelets: 256 10*3/uL (ref 150–400)
RBC: 3.04 MIL/uL — ABNORMAL LOW (ref 3.87–5.11)
RDW: 13.1 % (ref 11.5–15.5)
WBC: 7.3 10*3/uL (ref 4.0–10.5)
nRBC: 0 % (ref 0.0–0.2)

## 2023-06-05 LAB — NM MYOCAR MULTI W/SPECT W/WALL MOTION / EF
Estimated workload: 1
Exercise duration (min): 0 min
Exercise duration (sec): 0 s
LV dias vol: 118 mL (ref 46–106)
LV sys vol: 76 mL
MPHR: 138 {beats}/min
Nuc Stress EF: 36 %
Peak HR: 120 {beats}/min
Percent HR: 86 %
Rest HR: 113 {beats}/min
Rest Nuclear Isotope Dose: 10.2 mCi
SDS: 1
SRS: 28
SSS: 31
Stress Nuclear Isotope Dose: 29.3 mCi
TID: 0.9

## 2023-06-05 LAB — BASIC METABOLIC PANEL
Anion gap: 17 — ABNORMAL HIGH (ref 5–15)
BUN: 101 mg/dL — ABNORMAL HIGH (ref 8–23)
CO2: 19 mmol/L — ABNORMAL LOW (ref 22–32)
Calcium: 10.5 mg/dL — ABNORMAL HIGH (ref 8.9–10.3)
Chloride: 105 mmol/L (ref 98–111)
Creatinine, Ser: 6.22 mg/dL — ABNORMAL HIGH (ref 0.44–1.00)
GFR, Estimated: 6 mL/min — ABNORMAL LOW (ref >60)
Glucose, Bld: 124 mg/dL — ABNORMAL HIGH (ref 70–99)
Potassium: 4.7 mmol/L (ref 3.5–5.1)
Sodium: 141 mmol/L (ref 135–145)

## 2023-06-05 LAB — TROPONIN I (HIGH SENSITIVITY): Troponin I (High Sensitivity): 993 ng/L (ref <18)

## 2023-06-05 LAB — FOLATE: Folate: 16.6 ng/mL (ref >5.9)

## 2023-06-05 LAB — GLUCOSE, CAPILLARY
Glucose-Capillary: 121 mg/dL — ABNORMAL HIGH (ref 70–99)
Glucose-Capillary: 127 mg/dL — ABNORMAL HIGH (ref 70–99)
Glucose-Capillary: 140 mg/dL — ABNORMAL HIGH (ref 70–99)

## 2023-06-05 LAB — VITAMIN B12: Vitamin B-12: 344 pg/mL (ref 180–914)

## 2023-06-05 LAB — HEPATITIS B SURFACE ANTIGEN: Hepatitis B Surface Ag: NONREACTIVE

## 2023-06-05 LAB — HEPARIN LEVEL (UNFRACTIONATED): Heparin Unfractionated: 0.61 IU/mL (ref 0.30–0.70)

## 2023-06-05 LAB — CBG MONITORING, ED
Glucose-Capillary: 119 mg/dL — ABNORMAL HIGH (ref 70–99)
Glucose-Capillary: 118 mg/dL — ABNORMAL HIGH (ref 70–99)

## 2023-06-05 LAB — HEPATITIS B CORE ANTIBODY, TOTAL: Hep B Core Total Ab: NONREACTIVE

## 2023-06-05 MED ORDER — TECHNETIUM TC 99M TETROFOSMIN IV KIT
10.0000 | PACK | Freq: Once | INTRAVENOUS | Status: AC
  Administered 2023-06-05: 10.15 via INTRAVENOUS
  Filled 2023-06-05: qty 10

## 2023-06-05 MED ORDER — VITAMIN B-12 1000 MCG PO TABS
1000.0000 ug | ORAL_TABLET | Freq: Every day | ORAL | Status: DC
  Administered 2023-06-05 – 2023-06-07 (×3): 1000 ug via ORAL
  Filled 2023-06-05 (×3): qty 1

## 2023-06-05 MED ORDER — REGADENOSON 0.4 MG/5ML IV SOLN
0.4000 mg | Freq: Once | INTRAVENOUS | Status: AC
  Administered 2023-06-05: 0.4 mg via INTRAVENOUS

## 2023-06-05 MED ORDER — LORAZEPAM 2 MG/ML IJ SOLN: 0.5000 mg | Freq: Once | INTRAMUSCULAR | Status: DC | PRN

## 2023-06-05 MED ORDER — TECHNETIUM TC 99M TETROFOSMIN IV KIT
29.2700 | PACK | Freq: Once | INTRAVENOUS | Status: AC | PRN
  Administered 2023-06-05: 29.27 via INTRAVENOUS

## 2023-06-05 NOTE — Progress Notes (Signed)
Rounding Note    Patient Name: Cassandra Baker Date of Encounter: 06/05/2023  [facility] Cardiologist: Julien Nordmann, MD   Subjective   Confusion yesterday and overnight, intermittently required sitter Being required redirection back to her room Multiple times pulled telemetry leads off Nurses document in the ER found walking down the hallway with her IV pole, telemetry leads in her hand looking for her car  On rounds again today reports she would like to go home Again discussed with her grave condition of her kidney function, cardiac condition as was discussed with her yesterday warranting further evaluation No family at the bedside for further discussion  Inpatient Medications    Scheduled Meds:  aspirin EC  325 mg Oral QHS   atorvastatin  80 mg Oral Daily   calcitRIOL  0.5 mcg Oral Daily   ferrous sulfate  325 mg Oral Q1200   FLUoxetine  10 mg Oral Once per day on Mon Wed Fri   furosemide  40 mg Intravenous BID   insulin aspart  0-15 Units Subcutaneous TID WC   insulin aspart  0-5 Units Subcutaneous QHS   levothyroxine  75 mcg Oral Q0600   loratadine  10 mg Oral Daily   sodium bicarbonate  650 mg Oral BID   Continuous Infusions:  heparin 1,100 Units/hr (06/04/23 2235)   PRN Meds: acetaminophen **OR** acetaminophen, allopurinol, HYDROcodone-acetaminophen, hydrOXYzine, magnesium hydroxide, morphine injection, nitroGLYCERIN, ondansetron **OR** ondansetron (ZOFRAN) IV, traZODone   Vital Signs    Vitals:   06/05/23 0130 06/05/23 0200 06/05/23 0400 06/05/23 0542  BP: 139/72 137/77 119/65   Pulse: 95 99 87   Resp: (!) 32  19   Temp: 97.8 F (36.6 C)   97.7 F (36.5 C)  TempSrc:    Oral  SpO2: 97% 99% 94%   Weight:      Height:       No intake or output data in the 24 hours ending 06/05/23 0904    06/03/2023   11:50 PM 05/04/2023    9:43 AM 01/23/2022   11:04 AM  Last 3 Weights  Weight (lbs) 148 lb 142 lb 165 lb  Weight (kg) 67.132 kg 64.411 kg  74.844 kg      Telemetry    Normal sinus rhythm- Personally Reviewed  ECG     - Personally Reviewed  Physical Exam   GEN: No acute distress.   Neck: No JVD Cardiac: RRR, no murmurs, rubs, or gallops.  Respiratory: Clear to auscultation bilaterally. GI: Soft, nontender, non-distended  MS: No edema; No deformity. Neuro:  Nonfocal  Psych: Mild confusion  Labs    High Sensitivity Troponin:   Recent Labs  Lab 06/03/23 2353 06/04/23 0145  TROPONINIHS 986* 998*     Chemistry Recent Labs  Lab 06/03/23 2353 06/04/23 0408 06/05/23 0521  NA 141 141 141  K 4.8 4.5 4.7  CL 107 108 105  CO2 18* 17* 19*  GLUCOSE 172* 124* 124*  BUN 98* 99* 101*  CREATININE 6.24* 6.11* 6.22*  CALCIUM 9.5 9.5 10.5*  GFRNONAA 6* 6* 6*  ANIONGAP 16* 16* 17*    Lipids No results for input(s): "CHOL", "TRIG", "HDL", "LABVLDL", "LDLCALC", "CHOLHDL" in the last 168 hours.  Hematology Recent Labs  Lab 06/03/23 2353 06/04/23 0408 06/05/23 0521  WBC 6.9 7.1 7.3  RBC 2.81* 2.73* 3.04*  HGB 8.5* 8.2* 9.2*  HCT 27.6* 27.3* 29.6*  MCV 98.2 100.0 97.4  MCH 30.2 30.0 30.3  MCHC 30.8 30.0 31.1  RDW 13.2 13.1 13.1  PLT 227 216 256   Thyroid  Recent Labs  Lab 06/04/23 0408  TSH 2.976    BNP Recent Labs  Lab 06/03/23 2353  BNP 2,261.8*    DDimer No results for input(s): "DDIMER" in the last 168 hours.   Radiology    ECHOCARDIOGRAM COMPLETE  Result Date: 06/04/2023    ECHOCARDIOGRAM REPORT   Patient Name:   Cassandra Baker Date of Exam: 06/04/2023 Medical Rec #:  [id number]      Height:       64.0 in Accession #:    [id number]     Weight:       148.0 lb Date of Birth:  [birth year]      BSA:          1.721 m Patient Age:    81 years       BP:           124/74 mmHg Patient Gender: F              HR:           97 bpm. Exam Location:  [facility] Procedure: 2D Echo, Cardiac Doppler, Color Doppler and Strain Analysis Indications:     CHF, NSTEMI  History:         Patient has no prior history of  Echocardiogram examinations.                  CHF, Acute MI; Risk Factors:Diabetes. CKD.  Sonographer:     Mikki Harbor Referring Phys:  [id number] JAN A MANSY Diagnosing Phys: Julien Nordmann MD  Sonographer Comments: Global longitudinal strain was attempted. IMPRESSIONS  1. Left ventricular ejection fraction, by estimation, is 30 to 35%. The left ventricle has moderately decreased function. The left ventricle demonstrates mild global hypokinesis with severe hypokinesis of the inferior/posterior/septal wall. There is mild left ventricular hypertrophy. Left ventricular diastolic parameters are consistent with Grade I diastolic dysfunction (impaired relaxation).  2. Right ventricular systolic function is normal. The right ventricular size is normal.  3. The mitral valve is normal in structure. Moderate mitral valve regurgitation. No evidence of mitral stenosis. Moderate mitral annular calcification.  4. The aortic valve is normal in structure. Aortic valve regurgitation is not visualized. No aortic stenosis is present. Aortic valve mean gradient measures 8.0 mmHg.  5. The inferior vena cava is normal in size with greater than 50% respiratory variability, suggesting right atrial pressure of 3 mmHg. FINDINGS  Left Ventricle: Left ventricular ejection fraction, by estimation, is 30 to 35%. The left ventricle has moderately decreased function. The left ventricle demonstrates global hypokinesis. The average left ventricular global longitudinal strain is -4.4 %.  The left ventricular internal cavity size was normal in size. There is mild left ventricular hypertrophy. Left ventricular diastolic parameters are consistent with Grade I diastolic dysfunction (impaired relaxation). Right Ventricle: The right ventricular size is normal. No increase in right ventricular wall thickness. Right ventricular systolic function is normal. Left Atrium: Left atrial size was normal in size. Right Atrium: Right atrial size was normal in  size. Pericardium: There is no evidence of pericardial effusion. Mitral Valve: The mitral valve is normal in structure. Moderate mitral annular calcification. Moderate mitral valve regurgitation. No evidence of mitral valve stenosis. MV peak gradient, 5.0 mmHg. The mean mitral valve gradient is 2.0 mmHg. Tricuspid Valve: The tricuspid valve is normal in structure. Tricuspid valve regurgitation is not demonstrated. No evidence of tricuspid stenosis. Aortic Valve: The aortic valve  is normal in structure. Aortic valve regurgitation is not visualized. No aortic stenosis is present. Aortic valve mean gradient measures 8.0 mmHg. Aortic valve peak gradient measures 13.1 mmHg. Aortic valve area, by VTI measures 1.57 cm. Pulmonic Valve: The pulmonic valve was normal in structure. Pulmonic valve regurgitation is not visualized. No evidence of pulmonic stenosis. Aorta: The aortic root is normal in size and structure. Venous: The inferior vena cava is normal in size with greater than 50% respiratory variability, suggesting right atrial pressure of 3 mmHg. IAS/Shunts: The interatrial septum appears to be lipomatous. No atrial level shunt detected by color flow Doppler.  LEFT VENTRICLE PLAX 2D LVIDd:         4.90 cm     Diastology LVIDs:         4.20 cm     LV e' medial:    5.44 cm/s LV PW:         1.50 cm     LV E/e' medial:  21.1 LV IVS:        1.10 cm     LV e' lateral:   6.96 cm/s LVOT diam:     2.00 cm     LV E/e' lateral: 16.5 LV SV:         63 LV SV Index:   37          2D Longitudinal Strain LVOT Area:     3.14 cm    2D Strain GLS Avg:     -4.4 %  LV Volumes (MOD) LV vol d, MOD A2C: 85.3 ml LV vol d, MOD A4C: 73.8 ml LV vol s, MOD A2C: 48.3 ml LV vol s, MOD A4C: 37.4 ml LV SV MOD A2C:     37.0 ml LV SV MOD A4C:     73.8 ml LV SV MOD BP:      38.6 ml RIGHT VENTRICLE RV Basal diam:  3.25 cm RV Mid diam:    2.50 cm RV S prime:     13.10 cm/s TAPSE (M-mode): 2.7 cm LEFT ATRIUM             Index        RIGHT ATRIUM            Index LA diam:        3.90 cm 2.27 cm/m   RA Area:     12.70 cm LA Vol (A2C):   41.4 ml 24.05 ml/m  RA Volume:   31.80 ml  18.47 ml/m LA Vol (A4C):   58.4 ml 33.93 ml/m LA Biplane Vol: 49.8 ml 28.93 ml/m  AORTIC VALVE                     PULMONIC VALVE AV Area (Vmax):    1.69 cm      PV Vmax:       1.17 m/s AV Area (Vmean):   1.37 cm      PV Peak grad:  5.5 mmHg AV Area (VTI):     1.57 cm AV Vmax:           181.00 cm/s AV Vmean:          139.000 cm/s AV VTI:            0.405 m AV Peak Grad:      13.1 mmHg AV Mean Grad:      8.0 mmHg LVOT Vmax:         97.40 cm/s LVOT Vmean:  60.600 cm/s LVOT VTI:          0.202 m LVOT/AV VTI ratio: 0.50  AORTA Ao Root diam: 2.80 cm MITRAL VALVE MV Area (PHT): 5.16 cm       SHUNTS MV Area VTI:   2.11 cm       Systemic VTI:  0.20 m MV Peak grad:  5.0 mmHg       Systemic Diam: 2.00 cm MV Mean grad:  2.0 mmHg MV Vmax:       1.12 m/s MV Vmean:      63.1 cm/s MV Decel Time: 147 msec MR Peak grad:    129.0 mmHg MR Mean grad:    88.0 mmHg MR Vmax:         568.00 cm/s MR Vmean:        441.0 cm/s MR PISA:         1.01 cm MR PISA Eff ROA: 16 mm MR PISA Radius:  0.40 cm MV E velocity: 115.00 cm/s MV A velocity: 125.00 cm/s MV E/A ratio:  0.92 Julien Nordmann MD Electronically signed by Julien Nordmann MD Signature Date/Time: 06/04/2023/4:58:01 PM    Final    DG Chest 1 View  Result Date: 06/04/2023 CLINICAL DATA:  Shortness of breath EXAM: CHEST  1 VIEW COMPARISON:  03/11/2018 FINDINGS: There is hyperinflation of the lungs compatible with COPD. Increased markings in the lung bases. No effusions or pneumothorax. Heart mediastinal contours within normal limits. Aortic atherosclerosis. IMPRESSION: COPD. Increased markings in the lung bases could reflect atelectasis or scarring. Electronically Signed   By: Charlett Nose M.D.   On: 06/04/2023 00:34    Cardiac Studies   Echo Left ventricular ejection fraction, by estimation, is 30 to 35%. The  left ventricle has moderately  decreased function. The left ventricle  demonstrates mild global hypokinesis with severe hypokinesis of the  inferior/posterior/septal wall. There is  mild left ventricular hypertrophy. Left ventricular diastolic parameters  are consistent with Grade I diastolic dysfunction (impaired relaxation).   2. Right ventricular systolic function is normal. The right ventricular  size is normal.   3. The mitral valve is normal in structure. Moderate mitral valve  regurgitation. No evidence of mitral stenosis. Moderate mitral annular  calcification.   4. The aortic valve is normal in structure. Aortic valve regurgitation is  not visualized. No aortic stenosis is present. Aortic valve mean gradient  measures 8.0 mmHg.   5. The inferior vena cava is normal in size with greater than 50%  respiratory variability, suggesting right atrial pressure of 3 mmHg.    Patient Profile     82 y.o. female     Assessment & Plan    Acute diastolic and systolic CHF Presenting with worsening symptoms over the past week Also with acute on chronic renal failure creatinine of 6 up from 4.2 -Reports improved leg swelling and abdominal distention, improved shortness of breath with IV Lasix -Blood pressure improving -Not a candidate for ACE, ARB, ARNI given stage IV renal failure -Continue  IV Lasix twice daily, once euvolemic we will initiate low-dose beta-blocker,  -Jardiance on hold in the setting of renal failure   Cardiomyopathy Concern for ischemic etiology given regional wall motion abnormality on echo and concern for prior MI on EKG Continue aspirin statin heparin infusion Given renal dysfunction would avoid cardiac catheterization or cardiac CTA Tentatively scheduled for pharmacologic Myoview this morning   Non-STEMI Troponin 900  x2 in the setting of cardiomyopathy Regional wall motion abnormality on echo  As detailed above, unclear if she would be a good candidate for catheterization given confusion  and severe renal dysfunction Repeat troponin ordered, Myoview ordered   Diabetes type 2 with complications Jardiance held A1c 6.4   Acute on chronic renal failure Creatinine greater than 6, prior baseline around 4 Appears to be acute on chronic renal failure Continue IV Lasix given CHF symptoms with close monitoring of renal function Nephrology to see    Total encounter time more than 50 minutes  Greater than 50% was spent in counseling and coordination of care with the patient   For questions or updates, please contact [facility] Please consult [email or web address] for contact info under        Signed, Julien Nordmann, MD  06/05/2023, 9:04 AM

## 2023-06-05 NOTE — ED Notes (Signed)
Pt up to bathroom without assist. No co pain or shob at this time.

## 2023-06-05 NOTE — Progress Notes (Addendum)
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 Will go cardiac stress test as per cardio. Echo shows EF 30-35%, grade I diastolic dysfunction, regional wall motion abnormalities, mod MR. Nitro, morphine prn. Continue on tele. Cardio following and recs apprec    Acute on chronic diastolic CHF: continue on IV lasix. Monitor I/Os. BNP is elevated 2,261. Cardio recs apprec  Cardiomyopathy: etiology unclear, concern for ischemia given wall motion abnormality found on echo. Cardiac stress test today as per cardio    AKI on CKDIV: Cr is labile. Nephro consulted    DM2: HbA1c 6.9, well controlled. Continue on SSI w/ accuchecks   Hypothyroidism: continue on home dose of levothyroxine    Depression: severity unknown. Continue on home dose of fluoxetine   Macrocytic anemia: folate is WNL. B12 is low end of normal so will start B12 supplement   ? Encephalopathy: etiology unclear, delirium vs dementia. Concerns of sundowning on 06/04/23 overnight. Pt denies any hx of memory problems or dementia    DVT prophylaxis: heparin  Code Status: full  Family Communication: called pt's son, Cassandra Hua, but no answer & unable to leave a voicemail  Disposition Plan: PT/OT consulted   Level of care: Progressive Status is: Inpatient Remains inpatient appropriate because: on IV heparin drip     Consultants:  Cardio   Procedures:   Antimicrobials:    Subjective: Pt still c/o anxiety   Objective: Vitals:   06/05/23 0130 06/05/23 0200 06/05/23 0400 06/05/23 0542  BP: 139/72 137/77 119/65   Pulse: 95 99 87   Resp: (!) 32  19   Temp: 97.8 F (36.6 C)   97.7 F (36.5 C)  TempSrc:    Oral  SpO2: 97% 99% 94%   Weight:      Height:       No intake or output data in the 24 hours ending 06/05/23 0827 Filed Weights   06/03/23 2350  Weight: 67.1 kg    Examination:  General exam: Appears restless  Respiratory system: clear breath sounds b/l  Cardiovascular system: S1/S2+. No rubs or clicks  Gastrointestinal system: Abd is soft, NT, ND & normal bowel sounds  Central nervous system: Alert & awake. Moves all extremities  Psychiatry: judgement and insight appears not at baseline. Anxious mood and affect     Data Reviewed: I have personally reviewed following labs and imaging studies  CBC: Recent Labs  Lab 06/03/23 2353 06/04/23 0408 06/05/23 0521  WBC 6.9 7.1 7.3  HGB 8.5* 8.2* 9.2*  HCT 27.6* 27.3* 29.6*  MCV 98.2 100.0 97.4  PLT 227 216 256   Basic Metabolic Panel: Recent Labs  Lab 06/03/23 2353 06/04/23 0408 06/05/23 0521  NA 141 141 141  K 4.8 4.5 4.7  CL 107 108 105  CO2 18* 17* 19*  GLUCOSE 172* 124* 124*  BUN 98* 99* 101*  CREATININE 6.24* 6.11* 6.22*  CALCIUM 9.5 9.5 10.5*   GFR: Estimated Creatinine Clearance:  6.6 mL/min (A) (by C-G formula based on SCr of 6.22 mg/dL (H)). Liver Function Tests: No results for input(s): "AST", "ALT", "ALKPHOS", "BILITOT", "PROT", "ALBUMIN" in the last 168 hours. No results for input(s): "LIPASE", "AMYLASE" in the last 168 hours. No results for input(s): "AMMONIA" in the last 168 hours. Coagulation Profile: Recent Labs  Lab 06/04/23 0145  INR 1.2   Cardiac Enzymes: No results for input(s): "CKTOTAL", "CKMB", "CKMBINDEX", "TROPONINI" in the last 168 hours. BNP (last 3 results) No results for input(s): "PROBNP" in the last 8760 hours. HbA1C: Recent Labs     06/04/23 0408  HGBA1C 6.4*   CBG: Recent Labs  Lab 06/04/23 0734 06/04/23 1148 06/04/23 1640 06/04/23 2348  GLUCAP 112* 113* 144* 119*   Lipid Profile: No results for input(s): "CHOL", "HDL", "LDLCALC", "TRIG", "CHOLHDL", "LDLDIRECT" in the last 72 hours. Thyroid Function Tests: Recent Labs    06/04/23 0408  TSH 2.976   Anemia Panel: Recent Labs    06/05/23 0521  FOLATE 16.6   Sepsis Labs: No results for input(s): "PROCALCITON", "LATICACIDVEN" in the last 168 hours.  No results found for this or any previous visit (from the past 240 hour(s)).       Radiology Studies: ECHOCARDIOGRAM COMPLETE  Result Date: 06/04/2023    ECHOCARDIOGRAM REPORT   Patient Name:   Cassandra Baker Date of Exam: 06/04/2023 Medical Rec #:  [id number]      Height:       64.0 in Accession #:    [id number]     Weight:       148.0 lb Date of Birth:  Cassandra 24, 1942      BSA:          1.721 m Patient Age:    81 years       BP:           124/74 mmHg Patient Gender: F              HR:           97 bpm. Exam Location:  [facility] Procedure: 2D Echo, Cardiac Doppler, Color Doppler and Strain Analysis Indications:     CHF, NSTEMI  History:         Patient has no prior history of Echocardiogram examinations.                  CHF, Acute MI; Risk Factors:Diabetes. CKD.  Sonographer:     Cassandra Baker Referring Phys:  [id number] Cassandra Baker Diagnosing Phys: Cassandra Nordmann MD  Sonographer Comments: Global longitudinal strain was attempted. IMPRESSIONS  1. Left ventricular ejection fraction, by estimation, is 30 to 35%. The left ventricle has moderately decreased function. The left ventricle demonstrates mild global hypokinesis with severe hypokinesis of the inferior/posterior/septal wall. There is mild left ventricular hypertrophy. Left ventricular diastolic parameters are consistent with Grade I diastolic dysfunction (impaired relaxation).  2. Right ventricular systolic function is normal. The right ventricular size is normal.  3.  The mitral valve is normal in structure. Moderate mitral valve regurgitation. No evidence of mitral stenosis. Moderate mitral annular calcification.  4. The aortic valve is normal in structure. Aortic valve regurgitation is not visualized. No aortic stenosis is present. Aortic valve mean gradient measures 8.0 mmHg.  5. The inferior vena cava is normal in size with greater than 50% respiratory variability, suggesting right atrial pressure of 3 mmHg. FINDINGS  Left Ventricle: Left ventricular ejection fraction, by estimation, is 30 to 35%. The left ventricle has moderately decreased  function. The left ventricle demonstrates global hypokinesis. The average left ventricular global longitudinal strain is -4.4 %.  The left ventricular internal cavity size was normal in size. There is mild left ventricular hypertrophy. Left ventricular diastolic parameters are consistent with Grade I diastolic dysfunction (impaired relaxation). Right Ventricle: The right ventricular size is normal. No increase in right ventricular wall thickness. Right ventricular systolic function is normal. Left Atrium: Left atrial size was normal in size. Right Atrium: Right atrial size was normal in size. Pericardium: There is no evidence of pericardial effusion. Mitral Valve: The mitral valve is normal in structure. Moderate mitral annular calcification. Moderate mitral valve regurgitation. No evidence of mitral valve stenosis. MV peak gradient, 5.0 mmHg. The mean mitral valve gradient is 2.0 mmHg. Tricuspid Valve: The tricuspid valve is normal in structure. Tricuspid valve regurgitation is not demonstrated. No evidence of tricuspid stenosis. Aortic Valve: The aortic valve is normal in structure. Aortic valve regurgitation is not visualized. No aortic stenosis is present. Aortic valve mean gradient measures 8.0 mmHg. Aortic valve peak gradient measures 13.1 mmHg. Aortic valve area, by VTI measures 1.57 cm. Pulmonic Valve: The pulmonic valve was  normal in structure. Pulmonic valve regurgitation is not visualized. No evidence of pulmonic stenosis. Aorta: The aortic root is normal in size and structure. Venous: The inferior vena cava is normal in size with greater than 50% respiratory variability, suggesting right atrial pressure of 3 mmHg. IAS/Shunts: The interatrial septum appears to be lipomatous. No atrial level shunt detected by color flow Doppler.  LEFT VENTRICLE PLAX 2D LVIDd:         4.90 cm     Diastology LVIDs:         4.20 cm     LV e' medial:    5.44 cm/s LV PW:         1.50 cm     LV E/e' medial:  21.1 LV IVS:        1.10 cm     LV e' lateral:   6.96 cm/s LVOT diam:     2.00 cm     LV E/e' lateral: 16.5 LV SV:         63 LV SV Index:   37          2D Longitudinal Strain LVOT Area:     3.14 cm    2D Strain GLS Avg:     -4.4 %  LV Volumes (MOD) LV vol d, MOD A2C: 85.3 ml LV vol d, MOD A4C: 73.8 ml LV vol s, MOD A2C: 48.3 ml LV vol s, MOD A4C: 37.4 ml LV SV MOD A2C:     37.0 ml LV SV MOD A4C:     73.8 ml LV SV MOD BP:      38.6 ml RIGHT VENTRICLE RV Basal diam:  3.25 cm RV Mid diam:    2.50 cm RV S prime:     13.10 cm/s TAPSE (M-mode): 2.7 cm LEFT ATRIUM             Index        RIGHT ATRIUM           Index LA diam:        3.90 cm 2.27 cm/m   RA Area:     12.70 cm LA Vol (A2C):   41.4 ml 24.05 ml/m  RA Volume:   31.80 ml  18.47 ml/m LA Vol (A4C):   58.4 ml 33.93 ml/m LA Biplane Vol: 49.8 ml 28.93 ml/m  AORTIC VALVE                     PULMONIC VALVE AV Area (Vmax):    1.69 cm      PV Vmax:       1.17 m/s AV Area (Vmean):   1.37 cm      PV Peak grad:  5.5 mmHg AV Area (VTI):     1.57 cm AV Vmax:           181.00 cm/s AV Vmean:          139.000 cm/s AV VTI:            0.405 m AV Peak Grad:      13.1 mmHg AV Mean Grad:      8.0 mmHg LVOT Vmax:         97.40 cm/s LVOT Vmean:        60.600 cm/s LVOT VTI:          0.202 m LVOT/AV VTI ratio: 0.50  AORTA Ao Root diam: 2.80 cm MITRAL VALVE MV Area (PHT): 5.16 cm       SHUNTS MV Area VTI:   2.11  cm       Systemic VTI:  0.20 m MV Peak grad:  5.0 mmHg       Systemic Diam: 2.00 cm MV Mean grad:  2.0 mmHg MV Vmax:       1.12 m/s MV Vmean:      63.1 cm/s MV Decel Time: 147 msec MR Peak grad:    129.0 mmHg MR Mean grad:    88.0 mmHg MR Vmax:         568.00 cm/s MR Vmean:        441.0 cm/s MR PISA:         1.01 cm MR PISA Eff ROA: 16 mm MR PISA Radius:  0.40 cm MV E velocity: 115.00 cm/s MV A velocity: 125.00 cm/s MV E/A ratio:  0.92 Cassandra Nordmann MD Electronically signed by Cassandra Nordmann MD Signature Date/Time: 06/04/2023/4:58:01 PM    Final    DG Chest 1 View  Result Date: 06/04/2023 CLINICAL DATA:  Shortness of breath EXAM: CHEST  1 VIEW COMPARISON:  03/11/2018 FINDINGS: There is hyperinflation of the lungs compatible with COPD. Increased markings in the lung bases. No effusions or pneumothorax. Heart mediastinal contours within normal limits. Aortic atherosclerosis. IMPRESSION: COPD. Increased markings in the lung bases could reflect atelectasis or scarring. Electronically Signed   By: Charlett Nose M.D.   On: 06/04/2023 00:34        Scheduled Meds:  aspirin EC  325 mg Oral QHS   atorvastatin  80 mg Oral Daily   calcitRIOL  0.5 mcg Oral Daily   ferrous sulfate  325 mg Oral Q1200   FLUoxetine  10 mg Oral Once per day on Mon Wed Fri   furosemide  40 mg Intravenous BID   insulin aspart  0-15 Units Subcutaneous TID WC   insulin aspart  0-5 Units Subcutaneous QHS   levothyroxine  75 mcg Oral Q0600   loratadine  10 mg Oral Daily   sodium bicarbonate  650 mg Oral BID   Continuous Infusions:  heparin 1,100 Units/hr (06/04/23 2235)     LOS: 1 day    Time spent: 35 mins     Charise Killian, MD [facility] Pager [phone]  If 7PM-7AM, please contact night-coverage [email or web address] 06/05/2023, 8:27 AM

## 2023-06-05 NOTE — Progress Notes (Signed)
Thad Ranger (Daughter in Social worker) requests to be called on provider rounds in am with update. Contact # [phone].

## 2023-06-05 NOTE — Consult Note (Signed)
[facility] Consult Note:06/05/2023    Date of Admission:  06/04/2023           Reason for Consult:  AKI   Referring Provider: Charise Killian, MD Primary Care Provider: Renford Dills, MD   History of Presenting Illness:  Cassandra Baker is a 82 y.o. female with medical problems significant for osteoarthritis, asthma, CHF, CKD advanced, depression, diabetes, hypertension and hypothyroidism.  She presented to the emergency room with acute onset of dyspnea and worsening lower extremity edema.  Occasional nausea without vomiting or diarrhea. In the ER labs show significantly elevated creatinine of 6.24.  She follows with outpatient nephrology in [place].  She had AV fistula placed in the past which is nonfunctional.  Nephrology consult has been requested to evaluate for dialysis.   Review of Systems: Review of Systems  Constitutional:  Positive for malaise/fatigue.  HENT: Negative.    Eyes: Negative.   Respiratory: Negative.    Cardiovascular:  Positive for leg swelling.  Gastrointestinal: Negative.   Genitourinary: Negative.   Musculoskeletal: Negative.   Skin: Negative.   Neurological: Negative.   Endo/Heme/Allergies: Negative.   Psychiatric/Behavioral: Negative.      Past Medical History:  Diagnosis Date   Anemia    Arthritis    low back,knees   Asthma    Basal cell carcinoma (BCC)    a. nose   CHF (congestive heart failure) (HCC)    a. prev eval by Dr. Katrinka Blazing in [place] - "many yrs ago."   CKD (chronic kidney disease), stage IV (HCC)    Depression    Diabetes mellitus without complication (HCC)    Dyspnea    Hypertension    Hypothyroidism     Social History   Tobacco Use   Smoking status: Never   Smokeless tobacco: Never  Vaping Use   Vaping Use: Never used  Substance Use Topics   Alcohol use: No   Drug use: No    Family History  Problem Relation Age of Onset   Hypertension Mother        MI's beginning in 73's, died @ 47    Heart disease Mother    Hypertension Father    Heart disease Father    Heart disease Brother    Stroke Brother    Cervical cancer Maternal Aunt    Heart disease Brother    Stroke Brother      OBJECTIVE: Blood pressure (!) 145/71, pulse (!) 101, temperature 97.7 F (36.5 C), temperature source Oral, resp. rate 15, height 5\' 4"  (1.626 m), weight 67.1 kg, SpO2 95 %.  Physical Exam Physical Exam: General:  No acute distress, laying in the bed  HEENT  anicteric, moist oral mucous membrane  Pulm/lungs  normal breathing effort on room air  CVS/Heart  regular rhythm, no rub or gallop  Abdomen:   Soft, nontender  Extremities:  + peripheral edema  Neurologic:  Alert, oriented, able to follow commands  Skin:  No acute rashes     Lab Results Lab Results  Component Value Date   WBC 7.3 06/05/2023   HGB 9.2 (L) 06/05/2023   HCT 29.6 (L) 06/05/2023   MCV 97.4 06/05/2023   PLT 256 06/05/2023    Lab Results  Component Value Date   CREATININE 6.22 (H) 06/05/2023   BUN 101 (H) 06/05/2023   NA 141 06/05/2023   K 4.7 06/05/2023   CL 105 06/05/2023   CO2 19 (L) 06/05/2023    Lab Results  Component  Value Date   ALT 11 02/18/2018   AST 11 02/18/2018   ALKPHOS 135 02/18/2018   BILITOT 0.3 02/18/2018     Microbiology: No results found for this or any previous visit (from the past 240 hour(s)).  Medications: Scheduled Meds:  aspirin EC  325 mg Oral QHS   atorvastatin  80 mg Oral Daily   calcitRIOL  0.5 mcg Oral Daily   ferrous sulfate  325 mg Oral Q1200   FLUoxetine  10 mg Oral Once per day on Mon Wed Fri   furosemide  40 mg Intravenous BID   insulin aspart  0-15 Units Subcutaneous TID WC   insulin aspart  0-5 Units Subcutaneous QHS   levothyroxine  75 mcg Oral Q0600   loratadine  10 mg Oral Daily   sodium bicarbonate  650 mg Oral BID   Continuous Infusions:  heparin 1,100 Units/hr (06/04/23 2235)   PRN Meds:.acetaminophen **OR** acetaminophen, allopurinol,  HYDROcodone-acetaminophen, hydrOXYzine, LORazepam, magnesium hydroxide, morphine injection, nitroGLYCERIN, ondansetron **OR** ondansetron (ZOFRAN) IV, traZODone  Allergies  Allergen Reactions   Plum Pulp Anaphylaxis and Cough   Citrus     Coughing, chest tightness    Egg-Derived Products Itching    congestion   Other Itching    Dairy products/Soaps & detergents   Penicillins Rash    Has patient had a PCN reaction causing immediate rash, facial/tongue/throat swelling, SOB or lightheadedness with hypotension: Yes Has patient had a PCN reaction causing severe rash involving mucus membranes or skin necrosis: No Has patient had a PCN reaction that required hospitalization: No Has patient had a PCN reaction occurring within the last 10 years: No If all of the above answers are "NO", then may proceed with Cephalosporin use.     Urinalysis: No results for input(s): "COLORURINE", "LABSPEC", "PHURINE", "GLUCOSEU", "HGBUR", "BILIRUBINUR", "KETONESUR", "PROTEINUR", "UROBILINOGEN", "NITRITE", "LEUKOCYTESUR" in the last 72 hours.  Invalid input(s): "APPERANCEUR"    Imaging: US RENAL  Result Date: 06/05/2023 CLINICAL DATA:  Acute renal injury EXAM: RENAL / URINARY TRACT ULTRASOUND COMPLETE COMPARISON:  None Available. FINDINGS: Right Kidney: Length = 7.7 cm Renal cortical thinning. Diffusely increased cortical echogenicity with preserved corticomedullary differentiation which can be seen with medical renal disease. No urinary tract dilation or shadowing calculi. The ureter is not seen. Left Kidney: Length = 8.3 cm Renal cortical thinning. Diffusely increased cortical echogenicity with diminished corticomedullary differentiation which can be seen with medical renal disease. Interpolar simple-appearing cyst measures 10 mm. No urinary tract dilation or shadowing calculi. The ureter is not seen. Bladder: Appears normal for degree of bladder distention. Other: None. IMPRESSION: 1. No urinary tract dilation  or shadowing calculi. 2. Bilateral renal cortical thinning and increased cortical echogenicity which can be seen with medical renal disease. Electronically Signed   By: Agustin Cree M.D.   On: 06/05/2023 09:56   ECHOCARDIOGRAM COMPLETE  Result Date: 06/04/2023    ECHOCARDIOGRAM REPORT   Patient Name:   YIXIN CULLITON Date of Exam: 06/04/2023 Medical Rec #:  [id number]      Height:       64.0 in Accession #:    [id number]     Weight:       148.0 lb Date of Birth:  08-Jan-1941      BSA:          1.721 m Patient Age:    81 years       BP:           124/74 mmHg Patient Gender: F  HR:           97 bpm. Exam Location:  [facility] Procedure: 2D Echo, Cardiac Doppler, Color Doppler and Strain Analysis Indications:     CHF, NSTEMI  History:         Patient has no prior history of Echocardiogram examinations.                  CHF, Acute MI; Risk Factors:Diabetes. CKD.  Sonographer:     Mikki Harbor Referring Phys:  [id number] JAN A MANSY Diagnosing Phys: Julien Nordmann MD  Sonographer Comments: Global longitudinal strain was attempted. IMPRESSIONS  1. Left ventricular ejection fraction, by estimation, is 30 to 35%. The left ventricle has moderately decreased function. The left ventricle demonstrates mild global hypokinesis with severe hypokinesis of the inferior/posterior/septal wall. There is mild left ventricular hypertrophy. Left ventricular diastolic parameters are consistent with Grade I diastolic dysfunction (impaired relaxation).  2. Right ventricular systolic function is normal. The right ventricular size is normal.  3. The mitral valve is normal in structure. Moderate mitral valve regurgitation. No evidence of mitral stenosis. Moderate mitral annular calcification.  4. The aortic valve is normal in structure. Aortic valve regurgitation is not visualized. No aortic stenosis is present. Aortic valve mean gradient measures 8.0 mmHg.  5. The inferior vena cava is normal in size with greater than 50% respiratory  variability, suggesting right atrial pressure of 3 mmHg. FINDINGS  Left Ventricle: Left ventricular ejection fraction, by estimation, is 30 to 35%. The left ventricle has moderately decreased function. The left ventricle demonstrates global hypokinesis. The average left ventricular global longitudinal strain is -4.4 %.  The left ventricular internal cavity size was normal in size. There is mild left ventricular hypertrophy. Left ventricular diastolic parameters are consistent with Grade I diastolic dysfunction (impaired relaxation). Right Ventricle: The right ventricular size is normal. No increase in right ventricular wall thickness. Right ventricular systolic function is normal. Left Atrium: Left atrial size was normal in size. Right Atrium: Right atrial size was normal in size. Pericardium: There is no evidence of pericardial effusion. Mitral Valve: The mitral valve is normal in structure. Moderate mitral annular calcification. Moderate mitral valve regurgitation. No evidence of mitral valve stenosis. MV peak gradient, 5.0 mmHg. The mean mitral valve gradient is 2.0 mmHg. Tricuspid Valve: The tricuspid valve is normal in structure. Tricuspid valve regurgitation is not demonstrated. No evidence of tricuspid stenosis. Aortic Valve: The aortic valve is normal in structure. Aortic valve regurgitation is not visualized. No aortic stenosis is present. Aortic valve mean gradient measures 8.0 mmHg. Aortic valve peak gradient measures 13.1 mmHg. Aortic valve area, by VTI measures 1.57 cm. Pulmonic Valve: The pulmonic valve was normal in structure. Pulmonic valve regurgitation is not visualized. No evidence of pulmonic stenosis. Aorta: The aortic root is normal in size and structure. Venous: The inferior vena cava is normal in size with greater than 50% respiratory variability, suggesting right atrial pressure of 3 mmHg. IAS/Shunts: The interatrial septum appears to be lipomatous. No atrial level shunt detected by color  flow Doppler.  LEFT VENTRICLE PLAX 2D LVIDd:         4.90 cm     Diastology LVIDs:         4.20 cm     LV e' medial:    5.44 cm/s LV PW:         1.50 cm     LV E/e' medial:  21.1 LV IVS:        1.10  cm     LV e' lateral:   6.96 cm/s LVOT diam:     2.00 cm     LV E/e' lateral: 16.5 LV SV:         63 LV SV Index:   37          2D Longitudinal Strain LVOT Area:     3.14 cm    2D Strain GLS Avg:     -4.4 %  LV Volumes (MOD) LV vol d, MOD A2C: 85.3 ml LV vol d, MOD A4C: 73.8 ml LV vol s, MOD A2C: 48.3 ml LV vol s, MOD A4C: 37.4 ml LV SV MOD A2C:     37.0 ml LV SV MOD A4C:     73.8 ml LV SV MOD BP:      38.6 ml RIGHT VENTRICLE RV Basal diam:  3.25 cm RV Mid diam:    2.50 cm RV S prime:     13.10 cm/s TAPSE (M-mode): 2.7 cm LEFT ATRIUM             Index        RIGHT ATRIUM           Index LA diam:        3.90 cm 2.27 cm/m   RA Area:     12.70 cm LA Vol (A2C):   41.4 ml 24.05 ml/m  RA Volume:   31.80 ml  18.47 ml/m LA Vol (A4C):   58.4 ml 33.93 ml/m LA Biplane Vol: 49.8 ml 28.93 ml/m  AORTIC VALVE                     PULMONIC VALVE AV Area (Vmax):    1.69 cm      PV Vmax:       1.17 m/s AV Area (Vmean):   1.37 cm      PV Peak grad:  5.5 mmHg AV Area (VTI):     1.57 cm AV Vmax:           181.00 cm/s AV Vmean:          139.000 cm/s AV VTI:            0.405 m AV Peak Grad:      13.1 mmHg AV Mean Grad:      8.0 mmHg LVOT Vmax:         97.40 cm/s LVOT Vmean:        60.600 cm/s LVOT VTI:          0.202 m LVOT/AV VTI ratio: 0.50  AORTA Ao Root diam: 2.80 cm MITRAL VALVE MV Area (PHT): 5.16 cm       SHUNTS MV Area VTI:   2.11 cm       Systemic VTI:  0.20 m MV Peak grad:  5.0 mmHg       Systemic Diam: 2.00 cm MV Mean grad:  2.0 mmHg MV Vmax:       1.12 m/s MV Vmean:      63.1 cm/s MV Decel Time: 147 msec MR Peak grad:    129.0 mmHg MR Mean grad:    88.0 mmHg MR Vmax:         568.00 cm/s MR Vmean:        441.0 cm/s MR PISA:         1.01 cm MR PISA Eff ROA: 16 mm MR PISA Radius:  0.40 cm MV E velocity: 115.00 cm/s MV A  velocity: 125.00 cm/s MV  E/A ratio:  0.92 Julien Nordmann MD Electronically signed by Julien Nordmann MD Signature Date/Time: 06/04/2023/4:58:01 PM    Final    DG Chest 1 View  Result Date: 06/04/2023 CLINICAL DATA:  Shortness of breath EXAM: CHEST  1 VIEW COMPARISON:  03/11/2018 FINDINGS: There is hyperinflation of the lungs compatible with COPD. Increased markings in the lung bases. No effusions or pneumothorax. Heart mediastinal contours within normal limits. Aortic atherosclerosis. IMPRESSION: COPD. Increased markings in the lung bases could reflect atelectasis or scarring. Electronically Signed   By: Charlett Nose M.D.   On: 06/04/2023 00:34      Assessment/Plan:  Cassandra Baker is a 82 y.o. female with medical problems of osteoarthritis, asthma, CHF, advanced CKD, depression, diabetes, hypertension, hypothyroidism was admitted on 06/04/2023 for :  NSTEMI (non-ST elevated myocardial infarction) (HCC) [I21.4]  Chronic kidney disease stage V. Patient has advanced CKD secondary to diabetes and hypertension. Her most recent creatinine is 6.2/GFR 6 from 06/05/2023. She does have some volume overload in terms of some shortness of breath and lower extremity edema.  Patient reports some improvement with IV diuresis. She is also undergoing a stress test for further evaluation. She does not have any clear-cut uremic symptoms.  She prefers to wait over the weekend to make any definitive decision about starting dialysis. Agree with close observation over the next 2 to 3 days. No acute indication for dialysis at present.  Lower extremity edema and volume overload  Currently managed with furosemide IV 40 mg twice a day. Will monitor clinical response.  Secondary hyperparathyroidism Patient is on oral calcitriol. Her calcium level is elevated therefore we will hold calcitriol.  Anemia of chronic kidney disease Most recent hemoglobin 9.2 from 06/05/2023. Acceptable range. Will continue to  monitor.   Samai Corea Thedore Mins 06/05/23

## 2023-06-05 NOTE — ED Notes (Signed)
Pt ambulating to the bathroom void

## 2023-06-05 NOTE — ED Notes (Signed)
Report received from Ellen RN. Patient care assumed. Patient/RN introduction complete. Will continue to monitor.  

## 2023-06-05 NOTE — Progress Notes (Signed)
ANTICOAGULATION CONSULT NOTE  Pharmacy Consult for Heparin Infusion Indication: ACS/STEMI  Patient Measurements: Height: 5\' 4"  (162.6 cm) Weight: 67.1 kg (148 lb) IBW/kg (Calculated) : 54.7 Heparin Dosing Weight: 67.1 kg  Labs: Recent Labs    06/03/23 2353 06/04/23 0145 06/04/23 0408 06/04/23 0948 06/04/23 2026 06/05/23 0521  HGB 8.5*  --  8.2*  --   --  9.2*  HCT 27.6*  --  27.3*  --   --  29.6*  PLT 227  --  216  --   --  256  APTT  --  35  --   --   --   --   LABPROT  --  15.0  --   --   --   --   INR  --  1.2  --   --   --   --   HEPARINUNFRC  --   --   --  0.15* 0.61 0.61  CREATININE 6.24*  --  6.11*  --   --   --   TROPONINIHS 986* 998*  --   --   --   --     Estimated Creatinine Clearance: 6.7 mL/min (A) (by C-G formula based on SCr of 6.11 mg/dL (H)).  Medical History: Past Medical History:  Diagnosis Date   Anemia    Arthritis    low back,knees   Asthma    Basal cell carcinoma (BCC)    a. nose   CHF (congestive heart failure) (HCC)    a. prev eval by Dr. Katrinka Blazing in [place] - "many yrs ago."   CKD (chronic kidney disease), stage IV (HCC)    Depression    Diabetes mellitus without complication (HCC)    Dyspnea    Hypertension    Hypothyroidism     Assessment: Pt is a 82 yo female presenting to ED c/o lower extremity swelling & SOB, found elevated Troponin I lvl. Patient was found to have an NSTEMI, acute CHF, and AKI. Patient has a history of CHF, CKD and DM. Hemoglobin and platelets are stable.  [id number] 0948 HL 0.15, subtherapeutic; 850 un/hr 0613 2026 HL 0.61, therapeutic x 1; 1100 un/hr 0614 0521 HL 0.61, therapeutic x 2  Goal of Therapy:  Heparin level 0.3-0.7 units/ml Monitor platelets by anticoagulation protocol: Yes   Plan:  --Heparin level is therapeutic x 2 --Continue heparin infusion at 1100 units/hr --Recheck HL daily w/ AM labs while therapeutic --Daily CBC per protocol while on IV heparin  Otelia Sergeant, PharmD, [facility] 06/05/2023 5:55  AM

## 2023-06-06 DIAGNOSIS — N185 Chronic kidney disease, stage 5: Secondary | ICD-10-CM

## 2023-06-06 DIAGNOSIS — I214 Non-ST elevation (NSTEMI) myocardial infarction: Secondary | ICD-10-CM | POA: Diagnosis not present

## 2023-06-06 LAB — CBC
HCT: 24.8 % — ABNORMAL LOW (ref 36.0–46.0)
Hemoglobin: 7.6 g/dL — ABNORMAL LOW (ref 12.0–15.0)
MCH: 29.3 pg (ref 26.0–34.0)
MCHC: 30.6 g/dL (ref 30.0–36.0)
MCV: 95.8 fL (ref 80.0–100.0)
Platelets: 192 10*3/uL (ref 150–400)
RBC: 2.59 MIL/uL — ABNORMAL LOW (ref 3.87–5.11)
RDW: 12.9 % (ref 11.5–15.5)
WBC: 6 10*3/uL (ref 4.0–10.5)
nRBC: 0 % (ref 0.0–0.2)

## 2023-06-06 LAB — BASIC METABOLIC PANEL
Anion gap: 15 (ref 5–15)
BUN: 91 mg/dL — ABNORMAL HIGH (ref 8–23)
CO2: 20 mmol/L — ABNORMAL LOW (ref 22–32)
Calcium: 9.5 mg/dL (ref 8.9–10.3)
Chloride: 104 mmol/L (ref 98–111)
Creatinine, Ser: 6.26 mg/dL — ABNORMAL HIGH (ref 0.44–1.00)
GFR, Estimated: 6 mL/min — ABNORMAL LOW (ref >60)
Glucose, Bld: 142 mg/dL — ABNORMAL HIGH (ref 70–99)
Potassium: 4.7 mmol/L (ref 3.5–5.1)
Sodium: 139 mmol/L (ref 135–145)

## 2023-06-06 LAB — HEPARIN LEVEL (UNFRACTIONATED): Heparin Unfractionated: 0.43 IU/mL (ref 0.30–0.70)

## 2023-06-06 LAB — GLUCOSE, CAPILLARY
Glucose-Capillary: 118 mg/dL — ABNORMAL HIGH (ref 70–99)
Glucose-Capillary: 202 mg/dL — ABNORMAL HIGH (ref 70–99)
Glucose-Capillary: 72 mg/dL (ref 70–99)
Glucose-Capillary: 161 mg/dL — ABNORMAL HIGH (ref 70–99)

## 2023-06-06 LAB — HEPATITIS B SURFACE ANTIBODY, QUANTITATIVE: Hep B S AB Quant (Post): <3.5 m[IU]/mL — ABNORMAL LOW (ref >9.9)

## 2023-06-06 MED ORDER — METOPROLOL SUCCINATE ER 25 MG PO TB24
12.5000 mg | ORAL_TABLET | Freq: Every day | ORAL | Status: DC
  Administered 2023-06-06 – 2023-06-07 (×2): 12.5 mg via ORAL
  Filled 2023-06-06 (×2): qty 1

## 2023-06-06 NOTE — Progress Notes (Addendum)
Cardiology Progress Note   Patient Name: Cassandra Baker Date of Encounter: 06/06/2023  Primary Cardiologist: Julien Nordmann, MD  Subjective   No chest pain or sob overnight.  High risk stress test yesterday in setting of large inferior/inferolateral defect w/ significant ST elevation, but no ischemia.  Discussed all findings in detail w/ pt this AM.  She says that about a week prior to admission, she had severe chest tightness and restlessness one night, which was then followed by progressive dyspnea and edema, prompting admission.  Inpatient Medications    Scheduled Meds:  aspirin EC  325 mg Oral QHS   atorvastatin  80 mg Oral Daily   vitamin B-12  1,000 mcg Oral Daily   ferrous sulfate  325 mg Oral Q1200   FLUoxetine  10 mg Oral Once per day on Mon Wed Fri   furosemide  40 mg Intravenous BID   insulin aspart  0-15 Units Subcutaneous TID WC   insulin aspart  0-5 Units Subcutaneous QHS   levothyroxine  75 mcg Oral Q0600   loratadine  10 mg Oral Daily   sodium bicarbonate  650 mg Oral BID   Continuous Infusions:  heparin 1,100 Units/hr (06/05/23 2212)   PRN Meds: acetaminophen **OR** acetaminophen, allopurinol, HYDROcodone-acetaminophen, hydrOXYzine, LORazepam, magnesium hydroxide, morphine injection, nitroGLYCERIN, ondansetron **OR** ondansetron (ZOFRAN) IV, traZODone   Vital Signs    Vitals:   06/05/23 1950 06/05/23 2346 06/06/23 0526 06/06/23 0835  BP: (!) 117/57 104/67 (!) 121/59 (!) 115/59  Pulse: 95 100 95 83  Resp: 18 18 18 18   Temp: 97.8 F (36.6 C) 98 F (36.7 C) 98.1 F (36.7 C) 98 F (36.7 C)  TempSrc:      SpO2: 99% 100% 100% 100%  Weight:      Height:        Intake/Output Summary (Last 24 hours) at 06/06/2023 1119 Last data filed at 06/06/2023 1048 Gross per 24 hour  Intake 791.32 ml  Output 500 ml  Net 291.32 ml   Filed Weights   06/03/23 2350  Weight: 67.1 kg    Physical Exam   GEN: Well nourished, well developed, in no acute distress.   HEENT: Grossly normal.  Neck: Supple, no JVD, carotid bruits, or masses. Cardiac: RRR, 2/6 syst murmur @ the upper sternal border, no rubs or gallops. No clubbing, cyanosis, edema.  Radials 2+, DP/PT 2+ and equal bilaterally.  Respiratory:  Respirations regular and unlabored, clear to auscultation bilaterally. GI: Soft, nontender, nondistended, BS + x 4. MS: no deformity or atrophy. Skin: warm and dry, no rash. Neuro:  Strength and sensation are intact. Psych: AAOx3.  Normal affect.  Labs    Chemistry Recent Labs  Lab 06/04/23 0408 06/05/23 0521 06/06/23 0436  NA 141 141 139  K 4.5 4.7 4.7  CL 108 105 104  CO2 17* 19* 20*  GLUCOSE 124* 124* 142*  BUN 99* 101* 91*  CREATININE 6.11* 6.22* 6.26*  CALCIUM 9.5 10.5* 9.5  GFRNONAA 6* 6* 6*  ANIONGAP 16* 17* 15     Hematology Recent Labs  Lab 06/04/23 0408 06/05/23 0521 06/06/23 0436  WBC 7.1 7.3 6.0  RBC 2.73* 3.04* 2.59*  HGB 8.2* 9.2* 7.6*  HCT 27.3* 29.6* 24.8*  MCV 100.0 97.4 95.8  MCH 30.0 30.3 29.3  MCHC 30.0 31.1 30.6  RDW 13.1 13.1 12.9  PLT 216 256 192    Cardiac Enzymes  Recent Labs  Lab 06/03/23 2353 06/04/23 0145 06/05/23 1421  TROPONINIHS 986* 998*  993*      BNP    Component Value Date/Time   BNP 2,261.8 (H) 06/03/2023 2353   HbA1c  Lab Results  Component Value Date   HGBA1C 6.4 (H) 06/04/2023    Radiology    US RENAL  Result Date: 06/05/2023 CLINICAL DATA:  Acute renal injury EXAM: RENAL / URINARY TRACT ULTRASOUND COMPLETE COMPARISON:  None Available. FINDINGS: Right Kidney: Length = 7.7 cm Renal cortical thinning. Diffusely increased cortical echogenicity with preserved corticomedullary differentiation which can be seen with medical renal disease. No urinary tract dilation or shadowing calculi. The ureter is not seen. Left Kidney: Length = 8.3 cm Renal cortical thinning. Diffusely increased cortical echogenicity with diminished corticomedullary differentiation which can be seen with  medical renal disease. Interpolar simple-appearing cyst measures 10 mm. No urinary tract dilation or shadowing calculi. The ureter is not seen. Bladder: Appears normal for degree of bladder distention. Other: None. IMPRESSION: 1. No urinary tract dilation or shadowing calculi. 2. Bilateral renal cortical thinning and increased cortical echogenicity which can be seen with medical renal disease. Electronically Signed   By: Agustin Cree M.D.   On: 06/05/2023 09:56   DG Chest 1 View  Result Date: 06/04/2023 CLINICAL DATA:  Shortness of breath EXAM: CHEST  1 VIEW COMPARISON:  03/11/2018 FINDINGS: There is hyperinflation of the lungs compatible with COPD. Increased markings in the lung bases. No effusions or pneumothorax. Heart mediastinal contours within normal limits. Aortic atherosclerosis. IMPRESSION: COPD. Increased markings in the lung bases could reflect atelectasis or scarring. Electronically Signed   By: Charlett Nose M.D.   On: 06/04/2023 00:34    Telemetry    RSR to sinus tach, PACs - Personally Reviewed  Cardiac Studies   2D Echocardiogram 6.13.2024  1. Left ventricular ejection fraction, by estimation, is 30 to 35%. The  left ventricle has moderately decreased function. The left ventricle  demonstrates mild global hypokinesis with severe hypokinesis of the  inferior/posterior/septal wall. There is  mild left ventricular hypertrophy. Left ventricular diastolic parameters  are consistent with Grade I diastolic dysfunction (impaired relaxation).   2. Right ventricular systolic function is normal. The right ventricular  size is normal.   3. The mitral valve is normal in structure. Moderate mitral valve  regurgitation. No evidence of mitral stenosis. Moderate mitral annular  calcification.   4. The aortic valve is normal in structure. Aortic valve regurgitation is  not visualized. No aortic stenosis is present. Aortic valve mean gradient  measures 8.0 mmHg.   5. The inferior vena cava is  normal in size with greater than 50%  respiratory variability, suggesting right atrial pressure of 3 mmHg.  _____________  Marlane Hatcher 6.14.2024  Pharmacological myocardial perfusion imaging study with no significant  ischemia Large fixed defect in the inferior and inferolateral wall consistent with prior MI Hypokinesis of the inferior and inferolateral wall, EF estimated at 31% EKG changes concerning for ischemia at peak stress/recovery (diffuse ST elevations in the inferior and anterolateral leads V3 through V6. CT attenuation correction images with mild diffuse aortic atherosclerosis, coronary calcification, predominantly in the RCA  High risk scan in the setting of large infarct _____________    Patient Profile     82 y.o. 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  Assessment & Plan  1.  ? Late presenting STEMI vs NSTEMI/CAD:  No h/o chest pain but presented w/ progressive dyspnea and volume overload.  EF 30-35%.  HsTrop elev but flat trend 986  998  993.  Cath avoided due to worsening renal failure and creat 6.11 on arrival.  Lexi MV 6/14 w/ large inf/inflat infarct w/o ischemia.  EF 31%.  Baseline inf/inflat ST elev significantly worse following lexiscan injection, though pt did not have any significant c/p.  In retrospect, pt now says that about a week prior to admission, she had an episode of severe chest tightness that persisted several hours overnight.  It was subsequent to that episode that she developed progressive dyspnea and edema.  We discussed clinical picture and obj findings at length.  Suspect OOH inferolateral MI w/ late presentation resulting in LV dysfxn.  In light of renal failure, cont med rx for the time being, though  once she is on HD, would strongly consider diagnostic cath.  Cont asa, statin.  Hold off on clopidogrel in setting of progressive anemia.  BP's have been stable - will add low-dose toprol xl.   2.  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  MV w/ inf/inflat infarct w/o ischemia and cor Ca2+, predominantly in RCA.  Has been receiving IV lasix.  I/O inaccurate.  No weights.  Appears euvolemic on exam.  As above, avoiding cath in setting of renal failure, but would consider  once she is on HD.  Adding toprol xl.  No acei/arb/arni/mra/sglt2i in setting of renal failure.  No hydral/nitrate w/ relatively soft bp's - follow response to ? blocker.  Defer diuretic mgmt to nephrology - likely transition to PO.  3.  Acute on chronic renal failure:  Creat 6.11 on admission  6.26 this AM.  Seen by nephrology w/ rec to start HD - pt deferring for the time being.  4.  Essential HTN:  BP's stable - 1-teens.  Adding low-dose ? blocker.  5.  HL:  Cont statin.  Follow up lipids.  6.  Normocytic Anemia:  H/H lower today @ 7.6/24.8.  Low threshold to transfuse in setting of high risk stress test and nstemi.   7.  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  Per IM.  Signed, Nicolasa Ducking, NP  06/06/2023, 11:19 AM    For questions or updates, please contact   Please consult [email or web address] for contact info under Cardiology/STEMI.   EP Attending  Patient seen and examined. Agree with above. The patient denies chest pain or sob. On exam she is a pleasant well appearing elderly woman, NAD. Lungs with scattered basilar rales. CV with a RRR and ext with trace edema. Tele NSR.  A/P Out of hospital MI - she had a high risk stress test, due to large inferior scar. She also had ST elevation. I do not think she would be a surgical candidate ever. 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  Stage 5 renal failure - her creatinine remains at 6.2. She will soon be on HD. Depending on the result of renal function tomorrow, will decide on  proceeding with left heart cath and then HD vs trying to avoid and sending home.  Sharlot Gowda Omri Bertran,MD

## 2023-06-06 NOTE — TOC Initial Note (Signed)
Transition of Care [facility]) - Initial/Assessment Note    Patient Details  Name: Cassandra Baker MRN: [id number] Date of Birth: 07/02/1941  Transition of Care [facility]) CM/SW Contact:    Bing Quarry, RN Phone Number: 06/06/2023, 12:08 PM  Clinical Narrative: 06/06/23: Sherron Monday with patient regarding PT/OT recommendations for [facility] services on discharge. Patient remains a bit "shaken up" about issues that brought her to ED, worsening kidney issues, and cardiac issues. Was confused on admission, but oriented on conversation with RN CM and per RN assessment this am.   Patient came from home where she lives alone using a walking stick occasionally. She has a PCP, followed by Dr Allena Katz at [facility]. Had a HD fistula placed May 2020 but it has not been used. She stated she had been able to manage to renal issues until this admission.   Patient verbalized she has no issues with transportation (was driving PTA) or obtaining or paying for medications, has insurance. Verbal permission granted to speak to contact Naleyah, Bezio (Son)  [phone] [facility]).   Decision pending for HD per nephrology notes and patient wishes to wait to decide on [facility] though she does not have an agency preference or prior [facility] services. She stated she as access to a DME RW and a DME shower/tub chair recommended by therapy.     TOC will follow up Sunday and Monday and follow progress.   Gabriel Cirri RN CM (weekends only) [phone].                   Patient Goals and CMS Choice            Expected Discharge Plan and Services In-house Referral: [facility] Discharge Planning Services: CM Consult   Living arrangements for the past 2 months: Independent Living Facility                 DME Arranged: N/A (Patient states has RW and tub chair at home.) DME Agency: NA       HH Arranged:  (Patient wishes to wait till Monday to decide about [facility] services as she did not need before and was Independent PTA. However, had no  preference on [facility] agency at [facility] RN CM spoke with patient.)          Prior Living Arrangements/Services Living arrangements for the past 2 months: Independent Living Facility Lives with:: Self Patient language and need for interpreter reviewed:: No        Need for Family Participation in Patient Care: Yes (Comment) (TBD but HH recommending HH on discharge. Pt lives alone.) Care giver support system in place?:  (Patient lives along PTA, says she maybe able to get some help if needed but still a bit unclear about needs.)   Criminal Activity/Legal Involvement Pertinent to Current Situation/Hospitalization: No - Comment as needed  Activities of Daily Living Home Assistive Devices/Equipment: Cane (specify quad or straight) ADL Screening (condition at time of admission) Patient's cognitive ability adequate to safely complete daily activities?: Yes Is the patient deaf or have difficulty hearing?: No Does the patient have difficulty seeing, even when wearing glasses/contacts?: No Does the patient have difficulty concentrating, remembering, or making decisions?: No Patient able to express need for assistance with ADLs?: Yes Does the patient have difficulty dressing or bathing?: No Independently performs ADLs?: Yes (appropriate for developmental age) Does the patient have difficulty walking or climbing stairs?: No Weakness of Legs: Both Weakness of Arms/Hands: Both  Permission Sought/Granted Permission sought to share information  with : Case Manager, Family Supports (Verbal permission given to contact son regarding issues if patient is not available or unable to answer.) Permission granted to share information with : Yes, Verbal Permission Granted  Share Information with NAME: Suprina, Arey (Son)  Permission granted to share info w AGENCY: HH agencies, once she agrees to have [facility] services on DC.  Permission granted to share info w Relationship: Son  Permission granted to share info w  Contact Information: Morgana, Burrola (Son) [phone] (Mobile)  Emotional Assessment Appearance:: Appears stated age Attitude/Demeanor/Rapport: Engaged Affect (typically observed): Accepting Orientation: : Oriented to Self, Oriented to Place, Oriented to Situation, Oriented to  Time Alcohol / Substance Use: Not Applicable Psych Involvement: No (comment)  Admission diagnosis:  NSTEMI (non-ST elevated myocardial infarction) (HCC) [I21.4] AKI (acute kidney injury) (HCC) [N17.9] Patient Active Problem List   Diagnosis Date Noted   Confusion 06/05/2023   NSTEMI (non-ST elevated myocardial infarction) (HCC) 06/04/2023   Acute on chronic diastolic CHF (congestive heart failure) (HCC) 06/04/2023   Acute kidney injury superimposed on chronic kidney disease (HCC) 06/04/2023   Depression 06/04/2023   Hypothyroidism 06/04/2023   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) 06/04/2023   Acute systolic CHF (congestive heart failure) (HCC) 06/04/2023   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   Chronic kidney disease (CKD), stage IV (severe) (HCC) 10/25/2019   Lymphadenopathy, inguinal 02/09/2018   Endometrial cancer (HCC) 01/18/2018   Hyperparathyroidism, primary (HCC) 12/07/2014   Neoplasm of uncertain behavior of thyroid gland, 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   Primary hyperparathyroidism (HCC) 12/07/2014   PCP:  Renford Dills, MD Pharmacy:   [facility] [address], [place] - [address] [place] [address] Phone: [phone] Fax: [phone]     Social Determinants of Health (SDOH) Social History: SDOH Screenings   Food Insecurity: No Food Insecurity (06/05/2023)  Housing: Low Risk  (06/05/2023)  Transportation Needs: No Transportation Needs (06/05/2023)  Utilities: Not At Risk (06/05/2023)  Tobacco Use: Low Risk  (06/04/2023)   SDOH Interventions:     Readmission Risk Interventions    06/06/2023   12:08 PM  Readmission Risk  Prevention Plan  Transportation Screening Complete  PCP or Specialist Appt within 5-7 Days Complete  Home Care Screening Complete  Medication Review (RN CM) Referral to Pharmacy

## 2023-06-06 NOTE — Progress Notes (Signed)
ANTICOAGULATION CONSULT NOTE  Pharmacy Consult for Heparin Infusion Indication: ACS/STEMI  Patient Measurements: Height: 5\' 4"  (162.6 cm) Weight: 67.1 kg (148 lb) IBW/kg (Calculated) : 54.7 Heparin Dosing Weight: 67.1 kg  Labs: Recent Labs    06/03/23 2353 06/04/23 0145 06/04/23 0408 06/04/23 0948 06/04/23 2026 06/05/23 0521 06/05/23 1421 06/06/23 0436  HGB 8.5*  --  8.2*  --   --  9.2*  --  7.6*  HCT 27.6*  --  27.3*  --   --  29.6*  --  24.8*  PLT 227  --  216  --   --  256  --  192  APTT  --  35  --   --   --   --   --   --   LABPROT  --  15.0  --   --   --   --   --   --   INR  --  1.2  --   --   --   --   --   --   HEPARINUNFRC  --   --   --    < > 0.61 0.61  --  0.43  CREATININE 6.24*  --  6.11*  --   --  6.22*  --  6.26*  TROPONINIHS 986* 998*  --   --   --   --  993*  --    < > = values in this interval not displayed.    Estimated Creatinine Clearance: 6.5 mL/min (A) (by C-G formula based on SCr of 6.26 mg/dL (H)).  Medical History: Past Medical History:  Diagnosis Date   Anemia    Arthritis    low back,knees   Asthma    Basal cell carcinoma (BCC)    a. nose   CHF (congestive heart failure) (HCC)    a. prev eval by Dr. Katrinka Blazing in [place] - "many yrs ago."   CKD (chronic kidney disease), stage IV (HCC)    Depression    Diabetes mellitus without complication (HCC)    Dyspnea    Hypertension    Hypothyroidism     Assessment: Pt is a 82 yo female presenting to ED c/o lower extremity swelling & SOB, found elevated Troponin I lvl. Patient was found to have an NSTEMI, acute CHF, and AKI. Patient has a history of CHF, CKD and DM. Hemoglobin and platelets are stable.  6045 0948 HL 0.15, subtherapeutic; 850 un/hr 0613 2026 HL 0.61, therapeutic x 1; 1100 un/hr 0614 0521 HL 0.61, therapeutic x 2 0615 0436 HL 0.43, therapeutic x 3  Goal of Therapy:  Heparin level 0.3-0.7 units/ml Monitor platelets by anticoagulation protocol: Yes   Plan:  --Heparin level is  therapeutic x 3 --Continue heparin infusion at 1100 units/hr --Recheck HL daily w/ AM labs while therapeutic --Daily CBC per protocol while on IV heparin  Otelia Sergeant, PharmD, [facility] 06/06/2023 5:41 AM

## 2023-06-06 NOTE — Progress Notes (Signed)
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 D/c IV heparin as per cardio. S/p cardiac stress test which showed old fixed defect as per cardio. Echo shows EF 30-35%, grade I diastolic dysfunction, regional wall motion abnormalities, mod MR. Nitro, morphine prn. Continue on tele. Cardio following and recs apprec    Acute on chronic diastolic CHF: continue on IV lasix. Monitor I/Os. BNP is elevated 2,261. Cardio recs apprec  Cardiomyopathy: etiology unclear, concern for ischemia given wall motion abnormality found on echo. S/p cardiac stress which showed an old fixed defect on 06/05/23 as per cardio    AKI on CKDIV: Cr is trending up today. Likely needs HD. Nephro following and recs apprec     DM2: well controlled, HbA1c 6.9. Continue on SSI w/ accuchecks  Hypothyroidism: continue on home dose of levothyroxine    Depression: severity unknown. Continue on home dose of fluoxetine   Macrocytic anemia: folate is WNL. Continue on B12 supplement as B12 was on the low end of normal. Will transfuse if Hb < 7.0   ? Encephalopathy: etiology unclear, delirium vs dementia. Concerns of sundowning on 06/04/23 overnight. Pt denies any hx of memory problems or dementia    DVT prophylaxis: heparin  Code Status: full  Family Communication: called pt's son again Onalee Hua, but no answer & unable to leave a voicemail Disposition Plan: likely d/c home  w/ home health   Level of care: Progressive Status is: Inpatient Remains inpatient appropriate because: severity of illness, Cr is trending up     Consultants:  Cardio  Nephro   Procedures:   Antimicrobials:   Subjective: Pt still c/o anxiety   Objective: Vitals:   06/05/23 1448 06/05/23 1950 06/05/23 2346 06/06/23 0526  BP: (!) 109/51 (!) 117/57 104/67 (!) 121/59  Pulse: (!) 108 95 100 95  Resp: 18 18 18 18   Temp: 98.5 F (36.9 C) 97.8 F (36.6 C) 98 F (36.7 C) 98.1 F (36.7 C)  TempSrc:      SpO2: 95% 99% 100% 100%  Weight:      Height:        Intake/Output Summary (Last 24 hours) at 06/06/2023 0811 Last data filed at 06/06/2023 0551 Gross per 24 hour  Intake 551.32 ml  Output 500 ml  Net 51.32 ml   Filed Weights   06/03/23 2350  Weight: 67.1 kg    Examination:  General exam: Appears restless  Respiratory system: clear breath sounds b/l  Cardiovascular system: S1/S2+. No rubs or clicks  Gastrointestinal system: Abd is soft, NT, ND & normal bowel sounds  Central nervous system: Alert & awake. Moves all extremities  Psychiatry: judgement and insight appears not at baseline. Anxious mood and affect     Data Reviewed: I have personally reviewed following labs and imaging studies  CBC: Recent Labs  Lab 06/03/23 2353 06/04/23 0408 06/05/23 0521 06/06/23 0436  WBC 6.9 7.1 7.3 6.0  HGB 8.5* 8.2* 9.2* 7.6*  HCT 27.6* 27.3* 29.6* 24.8*  MCV 98.2 100.0 97.4 95.8  PLT 227 216 256 192   Basic Metabolic Panel: Recent Labs  Lab 06/03/23 2353 06/04/23 0408 06/05/23 0521 06/06/23 0436  NA 141 141 141 139  K 4.8 4.5 4.7 4.7  CL 107 108 105 104  CO2 18* 17* 19* 20*  GLUCOSE 172* 124* 124* 142*  BUN 98* 99* 101* 91*  CREATININE 6.24* 6.11* 6.22* 6.26*  CALCIUM 9.5 9.5 10.5* 9.5   GFR: Estimated Creatinine Clearance: 6.5 mL/min (A) (by C-G formula based on SCr of 6.26 mg/dL (H)). Liver Function Tests: No results for input(s): "AST", "ALT",  "ALKPHOS", "BILITOT", "PROT", "ALBUMIN" in the last 168 hours. No results for input(s): "LIPASE", "AMYLASE" in the last 168 hours. No results for input(s): "AMMONIA" in the last 168 hours. Coagulation Profile: Recent Labs  Lab 06/04/23 0145  INR 1.2   Cardiac Enzymes: No results for input(s): "CKTOTAL", "CKMB", "CKMBINDEX", "TROPONINI" in the last 168 hours. BNP (last 3 results) No results for input(s): "PROBNP" in the last 8760 hours. HbA1C: Recent Labs    06/04/23 0408  HGBA1C 6.4*   CBG: Recent Labs  Lab 06/04/23 2348 06/05/23 0816 06/05/23 1421 06/05/23 1733 06/05/23 2045  GLUCAP 119* 118* 121* 127* 140*   Lipid Profile: No results for input(s): "CHOL", "HDL", "LDLCALC", "TRIG", "CHOLHDL", "LDLDIRECT" in the last 72 hours. Thyroid Function Tests: Recent Labs    06/04/23 0408  TSH 2.976   Anemia Panel: Recent Labs    06/05/23 0521  VITAMINB12 344  FOLATE 16.6   Sepsis Labs: No results for input(s): "PROCALCITON", "LATICACIDVEN" in the last 168 hours.  No results found for this or any previous visit (from the past 240 hour(s)).       Radiology Studies: NM Myocar Multi W/Spect W/Wall Motion / EF  Result Date: 06/05/2023 Pharmacological myocardial perfusion imaging study with no significant  ischemia Large fixed defect in the inferior and inferolateral wall consistent with prior MI Hypokinesis of the inferior and inferolateral wall, EF estimated at 31% EKG changes concerning for ischemia at peak stress/recovery (diffuse ST elevations in the inferior and anterolateral leads V3 through V6. CT attenuation correction images with mild diffuse aortic atherosclerosis, coronary calcification, predominantly in the RCA High risk scan in the setting of large infarct Signed, Dossie Arbour, MD, Ph.D [facility]   US RENAL  Result Date: 06/05/2023 CLINICAL DATA:  Acute renal injury EXAM: RENAL / URINARY TRACT ULTRASOUND COMPLETE COMPARISON:  None Available. FINDINGS:  Right Kidney: Length = 7.7 cm Renal cortical thinning. Diffusely increased cortical echogenicity with preserved corticomedullary differentiation which can be seen with medical renal disease. No urinary tract dilation or shadowing calculi. The ureter is not seen. Left Kidney: Length = 8.3 cm Renal cortical thinning. Diffusely increased cortical echogenicity with diminished corticomedullary differentiation which can be seen with medical renal disease. Interpolar simple-appearing cyst measures 10 mm. No urinary tract dilation or shadowing calculi. The ureter is not seen. Bladder: Appears normal for degree of bladder distention. Other: None. IMPRESSION: 1. No urinary tract dilation or shadowing calculi. 2. Bilateral renal cortical thinning and increased cortical echogenicity which can be seen with medical renal disease. Electronically Signed   By: Agustin Cree M.D.   On: 06/05/2023 09:56   ECHOCARDIOGRAM COMPLETE  Result Date: 06/04/2023    ECHOCARDIOGRAM REPORT   Patient Name:   Cassandra Baker Date of Exam: 06/04/2023 Medical Rec #:  [id number]      Height:  64.0 in Accession #:    [id number]     Weight:       148.0 lb Date of Birth:  1941-12-12      BSA:          1.721 m Patient Age:    81 years       BP:           124/74 mmHg Patient Gender: F              HR:           97 bpm. Exam Location:  [facility] Procedure: 2D Echo, Cardiac Doppler, Color Doppler and Strain Analysis Indications:     CHF, NSTEMI  History:         Patient has no prior history of Echocardiogram examinations.                  CHF, Acute MI; Risk Factors:Diabetes. CKD.  Sonographer:     Mikki Harbor Referring Phys:  [id number] JAN A MANSY Diagnosing Phys: Julien Nordmann MD  Sonographer Comments: Global longitudinal strain was attempted. IMPRESSIONS  1. Left ventricular ejection fraction, by estimation, is 30 to 35%. The left ventricle has moderately decreased function. The left ventricle demonstrates mild global hypokinesis with severe hypokinesis  of the inferior/posterior/septal wall. There is mild left ventricular hypertrophy. Left ventricular diastolic parameters are consistent with Grade I diastolic dysfunction (impaired relaxation).  2. Right ventricular systolic function is normal. The right ventricular size is normal.  3. The mitral valve is normal in structure. Moderate mitral valve regurgitation. No evidence of mitral stenosis. Moderate mitral annular calcification.  4. The aortic valve is normal in structure. Aortic valve regurgitation is not visualized. No aortic stenosis is present. Aortic valve mean gradient measures 8.0 mmHg.  5. The inferior vena cava is normal in size with greater than 50% respiratory variability, suggesting right atrial pressure of 3 mmHg. FINDINGS  Left Ventricle: Left ventricular ejection fraction, by estimation, is 30 to 35%. The left ventricle has moderately decreased function. The left ventricle demonstrates global hypokinesis. The average left ventricular global longitudinal strain is -4.4 %.  The left ventricular internal cavity size was normal in size. There is mild left ventricular hypertrophy. Left ventricular diastolic parameters are consistent with Grade I diastolic dysfunction (impaired relaxation). Right Ventricle: The right ventricular size is normal. No increase in right ventricular wall thickness. Right ventricular systolic function is normal. Left Atrium: Left atrial size was normal in size. Right Atrium: Right atrial size was normal in size. Pericardium: There is no evidence of pericardial effusion. Mitral Valve: The mitral valve is normal in structure. Moderate mitral annular calcification. Moderate mitral valve regurgitation. No evidence of mitral valve stenosis. MV peak gradient, 5.0 mmHg. The mean mitral valve gradient is 2.0 mmHg. Tricuspid Valve: The tricuspid valve is normal in structure. Tricuspid valve regurgitation is not demonstrated. No evidence of tricuspid stenosis. Aortic Valve: The aortic  valve is normal in structure. Aortic valve regurgitation is not visualized. No aortic stenosis is present. Aortic valve mean gradient measures 8.0 mmHg. Aortic valve peak gradient measures 13.1 mmHg. Aortic valve area, by VTI measures 1.57 cm. Pulmonic Valve: The pulmonic valve was normal in structure. Pulmonic valve regurgitation is not visualized. No evidence of pulmonic stenosis. Aorta: The aortic root is normal in size and structure. Venous: The inferior vena cava is normal in size with greater than 50% respiratory variability, suggesting right atrial pressure of 3 mmHg. IAS/Shunts: The interatrial septum appears to be  lipomatous. No atrial level shunt detected by color flow Doppler.  LEFT VENTRICLE PLAX 2D LVIDd:         4.90 cm     Diastology LVIDs:         4.20 cm     LV e' medial:    5.44 cm/s LV PW:         1.50 cm     LV E/e' medial:  21.1 LV IVS:        1.10 cm     LV e' lateral:   6.96 cm/s LVOT diam:     2.00 cm     LV E/e' lateral: 16.5 LV SV:         63 LV SV Index:   37          2D Longitudinal Strain LVOT Area:     3.14 cm    2D Strain GLS Avg:     -4.4 %  LV Volumes (MOD) LV vol d, MOD A2C: 85.3 ml LV vol d, MOD A4C: 73.8 ml LV vol s, MOD A2C: 48.3 ml LV vol s, MOD A4C: 37.4 ml LV SV MOD A2C:     37.0 ml LV SV MOD A4C:     73.8 ml LV SV MOD BP:      38.6 ml RIGHT VENTRICLE RV Basal diam:  3.25 cm RV Mid diam:    2.50 cm RV S prime:     13.10 cm/s TAPSE (M-mode): 2.7 cm LEFT ATRIUM             Index        RIGHT ATRIUM           Index LA diam:        3.90 cm 2.27 cm/m   RA Area:     12.70 cm LA Vol (A2C):   41.4 ml 24.05 ml/m  RA Volume:   31.80 ml  18.47 ml/m LA Vol (A4C):   58.4 ml 33.93 ml/m LA Biplane Vol: 49.8 ml 28.93 ml/m  AORTIC VALVE                     PULMONIC VALVE AV Area (Vmax):    1.69 cm      PV Vmax:       1.17 m/s AV Area (Vmean):   1.37 cm      PV Peak grad:  5.5 mmHg AV Area (VTI):     1.57 cm AV Vmax:           181.00 cm/s AV Vmean:          139.000 cm/s AV VTI:             0.405 m AV Peak Grad:      13.1 mmHg AV Mean Grad:      8.0 mmHg LVOT Vmax:         97.40 cm/s LVOT Vmean:        60.600 cm/s LVOT VTI:          0.202 m LVOT/AV VTI ratio: 0.50  AORTA Ao Root diam: 2.80 cm MITRAL VALVE MV Area (PHT): 5.16 cm       SHUNTS MV Area VTI:   2.11 cm       Systemic VTI:  0.20 m MV Peak grad:  5.0 mmHg       Systemic Diam: 2.00 cm MV Mean grad:  2.0 mmHg MV Vmax:       1.12 m/s MV Vmean:  63.1 cm/s MV Decel Time: 147 msec MR Peak grad:    129.0 mmHg MR Mean grad:    88.0 mmHg MR Vmax:         568.00 cm/s MR Vmean:        441.0 cm/s MR PISA:         1.01 cm MR PISA Eff ROA: 16 mm MR PISA Radius:  0.40 cm MV E velocity: 115.00 cm/s MV A velocity: 125.00 cm/s MV E/A ratio:  0.92 Julien Nordmann MD Electronically signed by Julien Nordmann MD Signature Date/Time: 06/04/2023/4:58:01 PM    Final         Scheduled Meds:  aspirin EC  325 mg Oral QHS   atorvastatin  80 mg Oral Daily   vitamin B-12  1,000 mcg Oral Daily   ferrous sulfate  325 mg Oral Q1200   FLUoxetine  10 mg Oral Once per day on Mon Wed Fri   furosemide  40 mg Intravenous BID   insulin aspart  0-15 Units Subcutaneous TID WC   insulin aspart  0-5 Units Subcutaneous QHS   levothyroxine  75 mcg Oral Q0600   loratadine  10 mg Oral Daily   sodium bicarbonate  650 mg Oral BID   Continuous Infusions:  heparin 1,100 Units/hr (06/05/23 2212)     LOS: 2 days    Time spent: 25 mins     Charise Killian, MD [facility] Pager [phone]  If 7PM-7AM, please contact night-coverage [email or web address] 06/06/2023, 8:11 AM

## 2023-06-06 NOTE — Progress Notes (Signed)
Family would like updates from physicians 6/16. Please call Claris Che at [phone]

## 2023-06-06 NOTE — Evaluation (Signed)
Physical Therapy Evaluation Patient Details Name: Cassandra Baker MRN: [id number] DOB: 1941/12/15 Today's Date: 06/06/2023  History of Present Illness  Pt is an 82 yo female that presented to the ED for BLE swelling and SOB, noted for elevated troponin. Workup for NSTEMI, acute CHF, AKI.PMH of CHF, CKD, depression, DM, HTN, hypothyroidism.   Clinical Impression  Pt alert, agreeable to PT, up in room with OT at start of session, oriented throughout but verbose. Per pt at baseline she is modI with her walking stick and lives alone. Has a son that plans to come into town to visit her after her hospital stay. She was able to ambulate with her walking stick but reached for BUE support, educated and agreeable to use of RW. CGA-supervision for remainder of ambulation, and once educated on tehcnique, supervision for transfers as well.  Overall the patient demonstrated deficits (see "PT Problem List") that impede the patient's functional abilities, safety, and mobility and would benefit from skilled PT intervention. Recommendation is to continue skilled PT services to return pt to PLOF.        Recommendations for follow up therapy are one component of a multi-disciplinary discharge planning process, led by the attending physician.  Recommendations may be updated based on patient status, additional functional criteria and insurance authorization.  Follow Up Recommendations       Assistance Recommended at Discharge PRN  Patient can return home with the following  Assistance with cooking/housework;Assist for transportation;Help with stairs or ramp for entrance    Equipment Recommendations Rolling walker (2 wheels)  Recommendations for Other Services       Functional Status Assessment Patient has had a recent decline in their functional status and demonstrates the ability to make significant improvements in function in a reasonable and predictable amount of time.     Precautions / Restrictions  Precautions Precautions: Fall Restrictions Weight Bearing Restrictions: No      Mobility  Bed Mobility Overal bed mobility: Modified Independent                  Transfers Overall transfer level: Needs assistance Equipment used: Rolling walker (2 wheels) Transfers: Sit to/from Stand Sit to Stand: Supervision           General transfer comment: cued for hand placement    Ambulation/Gait Ambulation/Gait assistance: Min guard, Supervision Gait Distance (Feet): 150 Feet Assistive device: Rolling walker (2 wheels), Straight cane Gait Pattern/deviations: Decreased step length - right, Decreased step length - left          Stairs            Wheelchair Mobility    Modified Rankin (Stroke Patients Only)       Balance Overall balance assessment: Needs assistance Sitting-balance support: Feet supported Sitting balance-Leahy Scale: Normal     Standing balance support: Bilateral upper extremity supported, Reliant on assistive device for balance Standing balance-Leahy Scale: Poor                               Pertinent Vitals/Pain Pain Assessment Pain Assessment: No/denies pain    Home Living Family/patient expects to be discharged to:: Private residence Living Arrangements: Alone Available Help at Discharge: Family (son and d-i-l) Type of Home: House Home Access: Stairs to enter Entrance Stairs-Rails:  (one rail) Secretary/administrator of Steps: "a few"     Home Equipment: Cane - single point;Rollator (4 wheels) (SPC is a walking stick (REI hiking  stick, states she has two; only one here in hospital))      Prior Function Prior Level of Function : Independent/Modified Independent;Driving;History of Falls (last six months)             Mobility Comments: Pt states she intermittently uses walking stick. Vague about how many falls she's had; states she has had them in her home due to clutter. ADLs Comments: (I) ADLs and driving and  community IADLs.     Hand Dominance   Dominant Hand: Right    Extremity/Trunk Assessment   Upper Extremity Assessment Upper Extremity Assessment: Defer to OT evaluation    Lower Extremity Assessment Lower Extremity Assessment: Generalized weakness    Cervical / Trunk Assessment Cervical / Trunk Assessment: Normal  Communication   Communication: No difficulties  Cognition Arousal/Alertness: Awake/alert Behavior During Therapy: WFL for tasks assessed/performed Overall Cognitive Status: Within Functional Limits for tasks assessed                                          General Comments General comments (skin integrity, edema, etc.): Pt handed off to PT at end of session for mobility in hallway. Pt on IV Heparin during session; OT managed IV pole. Pt does state she feels a little less balanced due to procedure yesterday.    Exercises     Assessment/Plan    PT Assessment Patient needs continued PT services  PT Problem List Decreased mobility;Decreased activity tolerance;Decreased balance;Decreased knowledge of use of DME       PT Treatment Interventions DME instruction;Therapeutic activities;Gait training;Therapeutic exercise;Stair training;Balance training;Patient/family education;Functional mobility training;Neuromuscular re-education    PT Goals (Current goals can be found in the Care Plan section)  Acute Rehab PT Goals Patient Stated Goal: to return to PLOF PT Goal Formulation: With patient Time For Goal Achievement: 06/20/23 Potential to Achieve Goals: Good    Frequency Min 2X/week     Co-evaluation               AM-PAC PT "6 Clicks" Mobility  Outcome Measure Help needed turning from your back to your side while in a flat bed without using bedrails?: None Help needed moving from lying on your back to sitting on the side of a flat bed without using bedrails?: None Help needed moving to and from a bed to a chair (including a  wheelchair)?: None Help needed standing up from a chair using your arms (e.g., wheelchair or bedside chair)?: None Help needed to walk in hospital room?: A Little Help needed climbing 3-5 steps with a railing? : A Little 6 Click Score: 22    End of Session Equipment Utilized During Treatment: Gait belt Activity Tolerance: Patient tolerated treatment well Patient left: with call bell/phone within reach;with bed alarm set;in bed Nurse Communication: Mobility status PT Visit Diagnosis: Other abnormalities of gait and mobility (R26.89);Muscle weakness (generalized) (M62.81)    Time: 6045-4098 PT Time Calculation (min) (ACUTE ONLY): 9 min   Charges:   PT Evaluation $PT Eval Low Complexity: 1 Low PT Treatments $Therapeutic Activity: 8-22 mins        Olga Coaster PT, DPT 11:28 AM,06/06/23

## 2023-06-06 NOTE — Progress Notes (Signed)
[facility]  PROGRESS NOTE   Subjective:   Patient sitting in bed comfortable.  Appetite good.  Objective:  Vital signs: Blood pressure 113/69, pulse 94, temperature (!) 97.5 F (36.4 C), resp. rate 18, height 5\' 4"  (1.626 m), weight 67.1 kg, SpO2 99 %.  Intake/Output Summary (Last 24 hours) at 06/06/2023 1359 Last data filed at 06/06/2023 1100 Gross per 24 hour  Intake 791.32 ml  Output 550 ml  Net 241.32 ml   Filed Weights   06/03/23 2350  Weight: 67.1 kg     Physical Exam: General:  No acute distress  Head:  Normocephalic, atraumatic. Moist oral mucosal membranes  Eyes:  Anicteric  Neck:  Supple  Lungs:   Clear to auscultation, normal effort  Heart:  S1S2 no rubs  Abdomen:   Soft, nontender, bowel sounds present  Extremities:  peripheral edema.  Neurologic:  Awake, alert, following commands  Skin:  No lesions  Access:     Basic Metabolic Panel: Recent Labs  Lab 06/03/23 2353 06/04/23 0408 06/05/23 0521 06/06/23 0436  NA 141 141 141 139  K 4.8 4.5 4.7 4.7  CL 107 108 105 104  CO2 18* 17* 19* 20*  GLUCOSE 172* 124* 124* 142*  BUN 98* 99* 101* 91*  CREATININE 6.24* 6.11* 6.22* 6.26*  CALCIUM 9.5 9.5 10.5* 9.5   GFR: Estimated Creatinine Clearance: 6.5 mL/min (A) (by C-G formula based on SCr of 6.26 mg/dL (H)).  Liver Function Tests: No results for input(s): "AST", "ALT", "ALKPHOS", "BILITOT", "PROT", "ALBUMIN" in the last 168 hours. No results for input(s): "LIPASE", "AMYLASE" in the last 168 hours. No results for input(s): "AMMONIA" in the last 168 hours.  CBC: Recent Labs  Lab 06/03/23 2353 06/04/23 0408 06/05/23 0521 06/06/23 0436  WBC 6.9 7.1 7.3 6.0  HGB 8.5* 8.2* 9.2* 7.6*  HCT 27.6* 27.3* 29.6* 24.8*  MCV 98.2 100.0 97.4 95.8  PLT 227 216 256 192     HbA1C: Hgb A1c MFr Bld  Date/Time Value Ref Range Status  06/04/2023 04:08 AM 6.4 (H) 4.8 - 5.6 % Final    Comment:    (NOTE) Pre diabetes:           5.7%-6.4%  Diabetes:              >6.4%  Glycemic control for   <7.0% adults with diabetes   02/04/2018 11:04 AM 6.9 (H) 4.8 - 5.6 % Final    Comment:    (NOTE) Pre diabetes:          5.7%-6.4% Diabetes:              >6.4% Glycemic control for   <7.0% adults with diabetes     Urinalysis: No results for input(s): "COLORURINE", "LABSPEC", "PHURINE", "GLUCOSEU", "HGBUR", "BILIRUBINUR", "KETONESUR", "PROTEINUR", "UROBILINOGEN", "NITRITE", "LEUKOCYTESUR" in the last 72 hours.  Invalid input(s): "APPERANCEUR"    Imaging: NM Myocar Multi W/Spect W/Wall Motion / EF  Result Date: 06/05/2023 Pharmacological myocardial perfusion imaging study with no significant  ischemia Large fixed defect in the inferior and inferolateral wall consistent with prior MI Hypokinesis of the inferior and inferolateral wall, EF estimated at 31% EKG changes concerning for ischemia at peak stress/recovery (diffuse ST elevations in the inferior and anterolateral leads V3 through V6. CT attenuation correction images with mild diffuse aortic atherosclerosis, coronary calcification, predominantly in the RCA High risk scan in the setting of large infarct Signed, Dossie Arbour, MD, Ph.D [facility]   US RENAL  Result Date: 06/05/2023  CLINICAL DATA:  Acute renal injury EXAM: RENAL / URINARY TRACT ULTRASOUND COMPLETE COMPARISON:  None Available. FINDINGS: Right Kidney: Length = 7.7 cm Renal cortical thinning. Diffusely increased cortical echogenicity with preserved corticomedullary differentiation which can be seen with medical renal disease. No urinary tract dilation or shadowing calculi. The ureter is not seen. Left Kidney: Length = 8.3 cm Renal cortical thinning. Diffusely increased cortical echogenicity with diminished corticomedullary differentiation which can be seen with medical renal disease. Interpolar simple-appearing cyst measures 10 mm. No urinary tract dilation or shadowing calculi. The ureter is not seen. Bladder:  Appears normal for degree of bladder distention. Other: None. IMPRESSION: 1. No urinary tract dilation or shadowing calculi. 2. Bilateral renal cortical thinning and increased cortical echogenicity which can be seen with medical renal disease. Electronically Signed   By: Agustin Cree M.D.   On: 06/05/2023 09:56     Medications:     aspirin EC  325 mg Oral QHS   atorvastatin  80 mg Oral Daily   vitamin B-12  1,000 mcg Oral Daily   ferrous sulfate  325 mg Oral Q1200   FLUoxetine  10 mg Oral Once per day on Mon Wed Fri   furosemide  40 mg Intravenous BID   insulin aspart  0-15 Units Subcutaneous TID WC   insulin aspart  0-5 Units Subcutaneous QHS   levothyroxine  75 mcg Oral Q0600   loratadine  10 mg Oral Daily   metoprolol succinate  12.5 mg Oral Daily   sodium bicarbonate  650 mg Oral BID    Assessment/ Plan:     82 y.o. female with medical problems of osteoarthritis, asthma, CHF, advanced CKD, depression, diabetes, hypertension, hypothyroidism was admitted on 06/04/2023 with history of leg swelling and also shortness of breath.  #1: Chronic kidney disease stage V.  Patient is a GFR of less than 10.  Patient was admitted with volume overload.  She is now on Lasix with improvement.  Patient to be started on hemodialysis most likely on Monday.  #2: Congestive heart failure: Patient is advised to stay on 1000 cc fluid restriction.  Will continue the Lasix intravenously twice a day.  #3: Anemia: Patient has anemia most likely secondary to chronic kidney disease.  Will continue the iron supplementation.  #4: Metabolic acidosis: Metabolic acidosis secondary to chronic kidney disease.  Will continue the sodium bicarbonate.  #5: Second hyperparathyroidism: Patient has hypercalcemia and the calcitriol is on hold at this time.  Will continue to monitor closely.   LOS: 2 Lorain Childes, MD [facility] 6/15/20241:59 PM

## 2023-06-06 NOTE — Evaluation (Signed)
Occupational Therapy Evaluation Patient Details Name: AUDREANA LISBON MRN: [id number] DOB: Apr 03, 1941 Today's Date: 06/06/2023   History of Present Illness Gabriellia Houston is a 82 y/o F with PMH including CHF, depression, DM2, asthma, CKD IV. Admitted with LE edeman, dypnea, nausea; had NSTEMI.   Clinical Impression   Patient received for OT evaluation. See flowsheet below for details of function. Generally, patient requiring MOD (I) for bed mobility, CGA with walking stick for functional mobility, and set up-CGA (while standing) for ADLs. Pt stating she feels a little off balance since procedure yesterday.  Patient will benefit from continued OT while in acute care.     Recommendations for follow up therapy are one component of a multi-disciplinary discharge planning process, led by the attending physician.  Recommendations may be updated based on patient status, additional functional criteria and insurance authorization.   Assistance Recommended at Discharge PRN  Patient can return home with the following Assistance with cooking/housework    Functional Status Assessment  Patient has had a recent decline in their functional status and demonstrates the ability to make significant improvements in function in a reasonable and predictable amount of time.  Equipment Recommendations  Tub/shower seat    Recommendations for Other Services       Precautions / Restrictions Precautions Precautions: Fall Restrictions Weight Bearing Restrictions: No      Mobility Bed Mobility Overal bed mobility: Independent                  Transfers Overall transfer level: Needs assistance Equipment used: 1 person hand held assist Transfers: Sit to/from Stand Sit to Stand: Min guard           General transfer comment: gait belt used for safety, walking stick in R hand      Balance Overall balance assessment: Needs assistance Sitting-balance support: Feet supported Sitting  balance-Leahy Scale: Normal     Standing balance support: Bilateral upper extremity supported, Reliant on assistive device for balance Standing balance-Leahy Scale: Poor                             ADL either performed or assessed with clinical judgement   ADL Overall ADL's : Needs assistance/impaired     Grooming: Supervision/safety;Standing;Oral care;Brushing hair Grooming Details (indicate cue type and reason): Standing at sink; OT close by for safety             Lower Body Dressing: Set up;Sitting/lateral leans;Min guard Lower Body Dressing Details (indicate cue type and reason): donned underwear and shoes by leaning forward and figure four; no assistance of OT except for steadying while standing to pull up underwear.   Toilet Transfer Details (indicate cue type and reason): anticipate pt to require HHA or close supervision for safety, as pt needing slight steadying assistance for sit to stand from EOB         Functional mobility during ADLs: Min guard (handheld assist to stand, then use of gait belt and walking stick for mobility slowly to sink.) General ADL Comments: Recommend seated showering for fall prevention, as pt needed OT HHA and walking stick for sit to stand t/f for safety, as she felt slightly off-balance.     Vision Baseline Vision/History:  (has reading glasses on night table) Ability to See in Adequate Light: 0 Adequate Patient Visual Report: No change from baseline       Perception     Praxis  Pertinent Vitals/Pain Pain Assessment Pain Assessment: No/denies pain     Hand Dominance Right   Extremity/Trunk Assessment Upper Extremity Assessment Upper Extremity Assessment: Overall WFL for tasks assessed   Lower Extremity Assessment Lower Extremity Assessment: Defer to PT evaluation       Communication Communication Communication: No difficulties   Cognition Arousal/Alertness: Awake/alert Behavior During Therapy: WFL for  tasks assessed/performed Overall Cognitive Status: Within Functional Limits for tasks assessed                                 General Comments: Oriented; cooperative. Pt joking at times throughout session.     General Comments  Pt handed off to PT at end of session for mobility in hallway. Pt on IV Heparin during session; OT managed IV pole. Pt does state she feels a little less balanced due to procedure yesterday.    Exercises     Shoulder Instructions      Home Living Family/patient expects to be discharged to:: Private residence Living Arrangements: Alone Available Help at Discharge: Family (son and d-i-l) Type of Home: House Home Access: Stairs to enter [employer] of Steps: "a few" Entrance Stairs-Rails:  (one rail)       Bathroom Shower/Tub: Chief Strategy Officer: Standard     Home Equipment: Cane - single point (SPC is a walking stick (REI hiking stick, states she has two; only one here in hospital))          Prior Functioning/Environment Prior Level of Function : Independent/Modified Independent;Driving;History of Falls (last six months)             Mobility Comments: Pt states she intermittently uses walking stick. Vague about how many falls she's had; states she has had them in her home due to clutter. ADLs Comments: (I) ADLs and driving and community IADLs.        OT Problem List: Decreased activity tolerance;Impaired balance (sitting and/or standing)      OT Treatment/Interventions: Therapeutic exercise;Self-care/ADL training;Therapeutic activities;Patient/family education    OT Goals(Current goals can be found in the care plan section) Acute Rehab OT Goals Patient Stated Goal: Go home OT Goal Formulation: With patient Time For Goal Achievement: 06/20/23 Potential to Achieve Goals: Good ADL Goals Pt Will Perform Grooming: with modified independence;standing Pt Will Perform Lower Body Bathing: with modified  independence;sit to/from stand Pt Will Transfer to Toilet: with modified independence;ambulating Pt Will Perform Toileting - Clothing Manipulation and hygiene: with modified independence;sit to/from stand Pt Will Perform Tub/Shower Transfer: with modified independence;ambulating;shower seat  OT Frequency: Min 2X/week    Co-evaluation              AM-PAC OT "6 Clicks" Daily Activity     Outcome Measure Help from another person eating meals?: None Help from another person taking care of personal grooming?: A Little Help from another person toileting, which includes using toliet, bedpan, or urinal?: A Little Help from another person bathing (including washing, rinsing, drying)?: A Little Help from another person to put on and taking off regular upper body clothing?: None Help from another person to put on and taking off regular lower body clothing?: A Little 6 Click Score: 20   End of Session Equipment Utilized During Treatment: Gait belt;Other (comment) (pt's walking stick) Nurse Communication: Mobility status  Activity Tolerance: Patient tolerated treatment well Patient left: Other (comment) (standing in hallway in care of PT)  OT Visit  Diagnosis: Unsteadiness on feet (R26.81)                Time: 8469-6295 OT Time Calculation (min): 23 min Charges:  OT General Charges $OT Visit: 1 Visit OT Evaluation $OT Eval Moderate Complexity: 1 Mod OT Treatments $Self Care/Home Management : 8-22 mins  Linward Foster, MS, OTR/L  Alvester Morin 06/06/2023, 9:22 AM

## 2023-06-07 DIAGNOSIS — I255 Ischemic cardiomyopathy: Secondary | ICD-10-CM

## 2023-06-07 DIAGNOSIS — I214 Non-ST elevation (NSTEMI) myocardial infarction: Secondary | ICD-10-CM | POA: Diagnosis not present

## 2023-06-07 LAB — BASIC METABOLIC PANEL
Anion gap: 17 — ABNORMAL HIGH (ref 5–15)
BUN: 95 mg/dL — ABNORMAL HIGH (ref 8–23)
CO2: 18 mmol/L — ABNORMAL LOW (ref 22–32)
Calcium: 9.7 mg/dL (ref 8.9–10.3)
Chloride: 101 mmol/L (ref 98–111)
Creatinine, Ser: 6.48 mg/dL — ABNORMAL HIGH (ref 0.44–1.00)
GFR, Estimated: 6 mL/min — ABNORMAL LOW (ref >60)
Glucose, Bld: 126 mg/dL — ABNORMAL HIGH (ref 70–99)
Potassium: 5 mmol/L (ref 3.5–5.1)
Sodium: 136 mmol/L (ref 135–145)

## 2023-06-07 LAB — LIPID PANEL
Cholesterol: 157 mg/dL (ref 0–200)
HDL: 61 mg/dL (ref >40)
LDL Cholesterol: 70 mg/dL (ref 0–99)
Total CHOL/HDL Ratio: 2.6 RATIO
Triglycerides: 129 mg/dL (ref <150)
VLDL: 26 mg/dL (ref 0–40)

## 2023-06-07 LAB — CBC
HCT: 26.6 % — ABNORMAL LOW (ref 36.0–46.0)
Hemoglobin: 8.2 g/dL — ABNORMAL LOW (ref 12.0–15.0)
MCH: 29.9 pg (ref 26.0–34.0)
MCHC: 30.8 g/dL (ref 30.0–36.0)
MCV: 97.1 fL (ref 80.0–100.0)
Platelets: 212 10*3/uL (ref 150–400)
RBC: 2.74 MIL/uL — ABNORMAL LOW (ref 3.87–5.11)
RDW: 13.2 % (ref 11.5–15.5)
WBC: 6.5 10*3/uL (ref 4.0–10.5)
nRBC: 0 % (ref 0.0–0.2)

## 2023-06-07 LAB — GLUCOSE, CAPILLARY
Glucose-Capillary: 135 mg/dL — ABNORMAL HIGH (ref 70–99)
Glucose-Capillary: 110 mg/dL — ABNORMAL HIGH (ref 70–99)
Glucose-Capillary: 126 mg/dL — ABNORMAL HIGH (ref 70–99)

## 2023-06-07 MED ORDER — ALUM & MAG HYDROXIDE-SIMETH 200-200-20 MG/5ML PO SUSP: 30.0000 mL | ORAL | Status: DC | PRN

## 2023-06-07 MED ORDER — FLUOXETINE HCL 20 MG PO CAPS: 20.0000 mg | ORAL_CAPSULE | Freq: Every day | ORAL | Status: DC

## 2023-06-07 NOTE — Progress Notes (Signed)
Progress Note  Patient Name: Cassandra Baker Date of Encounter: 06/07/2023  Primary Cardiologist: Julien Nordmann, MD   Subjective   No chest pain or sob.   Inpatient Medications    Scheduled Meds:  aspirin EC  325 mg Oral QHS   atorvastatin  80 mg Oral Daily   vitamin B-12  1,000 mcg Oral Daily   ferrous sulfate  325 mg Oral Q1200   FLUoxetine  20 mg Oral Daily   furosemide  40 mg Intravenous BID   insulin aspart  0-15 Units Subcutaneous TID WC   insulin aspart  0-5 Units Subcutaneous QHS   levothyroxine  75 mcg Oral Q0600   loratadine  10 mg Oral Daily   metoprolol succinate  12.5 mg Oral Daily   sodium bicarbonate  650 mg Oral BID   Continuous Infusions:  PRN Meds: acetaminophen **OR** acetaminophen, allopurinol, alum & mag hydroxide-simeth, HYDROcodone-acetaminophen, hydrOXYzine, LORazepam, magnesium hydroxide, morphine injection, nitroGLYCERIN, ondansetron **OR** ondansetron (ZOFRAN) IV, traZODone   Vital Signs    Vitals:   06/06/23 2059 06/06/23 2345 06/07/23 0415 06/07/23 0727  BP: (!) 111/53 (!) 112/53 (!) 118/55 119/60  Pulse: 74 78 80 81  Resp: 17 15 16 17   Temp: 98.1 F (36.7 C) 98.1 F (36.7 C) 98 F (36.7 C) 98.2 F (36.8 C)  TempSrc: Oral Oral Oral   SpO2: 99% 97% 100% 100%  Weight:    66.1 kg  Height:        Intake/Output Summary (Last 24 hours) at 06/07/2023 1137 Last data filed at 06/07/2023 1000 Gross per 24 hour  Intake 609.02 ml  Output 300 ml  Net 309.02 ml   Filed Weights   06/03/23 2350 06/07/23 0727  Weight: 67.1 kg 66.1 kg    Telemetry    nsr - Personally Reviewed  ECG    none - Personally Reviewed  Physical Exam   GEN: No acute distress.   Neck: No JVD Cardiac: RRR, no murmurs, rubs, or gallops.  Respiratory: Clear to auscultation bilaterally. GI: Soft, nontender, non-distended  MS: No edema; No deformity. Neuro:  Nonfocal  Psych: Normal affect   Labs    Chemistry Recent Labs  Lab 06/05/23 0521  06/06/23 0436 06/07/23 0458  NA 141 139 136  K 4.7 4.7 5.0  CL 105 104 101  CO2 19* 20* 18*  GLUCOSE 124* 142* 126*  BUN 101* 91* 95*  CREATININE 6.22* 6.26* 6.48*  CALCIUM 10.5* 9.5 9.7  GFRNONAA 6* 6* 6*  ANIONGAP 17* 15 17*     Hematology Recent Labs  Lab 06/05/23 0521 06/06/23 0436 06/07/23 0458  WBC 7.3 6.0 6.5  RBC 3.04* 2.59* 2.74*  HGB 9.2* 7.6* 8.2*  HCT 29.6* 24.8* 26.6*  MCV 97.4 95.8 97.1  MCH 30.3 29.3 29.9  MCHC 31.1 30.6 30.8  RDW 13.1 12.9 13.2  PLT 256 192 212    Cardiac EnzymesNo results for input(s): "TROPONINI" in the last 168 hours. No results for input(s): "TROPIPOC" in the last 168 hours.   BNP Recent Labs  Lab 06/03/23 2353  BNP 2,261.8*     DDimer No results for input(s): "DDIMER" in the last 168 hours.   Radiology    NM Myocar Multi W/Spect W/Wall Motion / EF  Result Date: 06/05/2023 Pharmacological myocardial perfusion imaging study with no significant  ischemia Large fixed defect in the inferior and inferolateral wall consistent with prior MI Hypokinesis of the inferior and inferolateral wall, EF estimated at 31% EKG changes concerning for  ischemia at peak stress/recovery (diffuse ST elevations in the inferior and anterolateral leads V3 through V6. CT attenuation correction images with mild diffuse aortic atherosclerosis, coronary calcification, predominantly in the RCA High risk scan in the setting of large infarct Signed, Dossie Arbour, MD, Ph.D [facility]    Cardiac Studies   See above  Patient Profile     82 y.o. 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  Assessment & Plan    ICM - stress test noted. We will hold off on left heart cath as her MI is completed, she has no angina, and creatinine over 6.  Stage 5 renal failure - I'll defer decision to start HD to renal service. 3. Acute systolic heart failure -she is class. No  good medical options. Not a candidate for ACE/ARB/Entresto. Continue low dose toprol. 4. Disp. - from my perspective, she can be discharged with early followup.   For questions or updates, please contact [facility] Please consult [email or web address] for contact info under Cardiology/STEMI.    Signed, Lewayne Bunting, MD  06/07/2023, 11:37 AM

## 2023-06-07 NOTE — Progress Notes (Signed)
[facility]  PROGRESS NOTE   Subjective:   Ambulating with walker today.  Denies any chest pain or shortness of breath.  Appetite has been good.  Cardiology note appreciated.  Objective:  Vital signs: Blood pressure 124/62, pulse 83, temperature 97.9 F (36.6 C), resp. rate 16, height 5\' 4"  (1.626 m), weight 66.1 kg, SpO2 100 %.  Intake/Output Summary (Last 24 hours) at 06/07/2023 1915 Last data filed at 06/07/2023 1345 Gross per 24 hour  Intake 480 ml  Output 650 ml  Net -170 ml   Filed Weights   06/03/23 2350 06/07/23 0727  Weight: 67.1 kg 66.1 kg     Physical Exam: General:  No acute distress  Head:  Normocephalic, atraumatic. Moist oral mucosal membranes  Eyes:  Anicteric  Neck:  Supple  Lungs:   Clear to auscultation, normal effort  Heart:  S1S2 no rubs  Abdomen:   Soft, nontender, bowel sounds present  Extremities:  peripheral edema.  Neurologic:  Awake, alert, following commands  Skin:  No lesions  Access:     Basic Metabolic Panel: Recent Labs  Lab 06/03/23 2353 06/04/23 0408 06/05/23 0521 06/06/23 0436 06/07/23 0458  NA 141 141 141 139 136  K 4.8 4.5 4.7 4.7 5.0  CL 107 108 105 104 101  CO2 18* 17* 19* 20* 18*  GLUCOSE 172* 124* 124* 142* 126*  BUN 98* 99* 101* 91* 95*  CREATININE 6.24* 6.11* 6.22* 6.26* 6.48*  CALCIUM 9.5 9.5 10.5* 9.5 9.7   GFR: Estimated Creatinine Clearance: 6.3 mL/min (A) (by C-G formula based on SCr of 6.48 mg/dL (H)).  Liver Function Tests: No results for input(s): "AST", "ALT", "ALKPHOS", "BILITOT", "PROT", "ALBUMIN" in the last 168 hours. No results for input(s): "LIPASE", "AMYLASE" in the last 168 hours. No results for input(s): "AMMONIA" in the last 168 hours.  CBC: Recent Labs  Lab 06/03/23 2353 06/04/23 0408 06/05/23 0521 06/06/23 0436 06/07/23 0458  WBC 6.9 7.1 7.3 6.0 6.5  HGB 8.5* 8.2* 9.2* 7.6* 8.2*  HCT 27.6* 27.3* 29.6* 24.8* 26.6*  MCV 98.2 100.0 97.4 95.8 97.1  PLT 227 216 256 192  212     HbA1C: Hgb A1c MFr Bld  Date/Time Value Ref Range Status  06/04/2023 04:08 AM 6.4 (H) 4.8 - 5.6 % Final    Comment:    (NOTE) Pre diabetes:          5.7%-6.4%  Diabetes:              >6.4%  Glycemic control for   <7.0% adults with diabetes   02/04/2018 11:04 AM 6.9 (H) 4.8 - 5.6 % Final    Comment:    (NOTE) Pre diabetes:          5.7%-6.4% Diabetes:              >6.4% Glycemic control for   <7.0% adults with diabetes     Urinalysis: No results for input(s): "COLORURINE", "LABSPEC", "PHURINE", "GLUCOSEU", "HGBUR", "BILIRUBINUR", "KETONESUR", "PROTEINUR", "UROBILINOGEN", "NITRITE", "LEUKOCYTESUR" in the last 72 hours.  Invalid input(s): "APPERANCEUR"    Imaging: No results found.   Medications:     aspirin EC  325 mg Oral QHS   atorvastatin  80 mg Oral Daily   vitamin B-12  1,000 mcg Oral Daily   ferrous sulfate  325 mg Oral Q1200   FLUoxetine  20 mg Oral Daily   furosemide  40 mg Intravenous BID   insulin aspart  0-15 Units Subcutaneous TID WC  insulin aspart  0-5 Units Subcutaneous QHS   levothyroxine  75 mcg Oral Q0600   loratadine  10 mg Oral Daily   metoprolol succinate  12.5 mg Oral Daily   sodium bicarbonate  650 mg Oral BID    Assessment/ Plan:     82 y.o. female with medical problems of osteoarthritis, asthma, CHF, advanced CKD, depression, diabetes, hypertension, hypothyroidism was admitted on 06/04/2023 with history of leg swelling and also shortness of breath.   #1: Chronic kidney disease stage V.  Patient is a GFR of less than 10.  Patient was admitted with volume overload.  She is now on Lasix with improvement.  Patient to be started on hemodialysis most likely on Monday.   #2: Congestive heart failure: Patient is advised to stay on 1000 cc fluid restriction.  Will continue the the 40 mg of Lasix intravenously twice a day.   #3: Anemia: Patient has anemia most likely secondary to chronic kidney disease.  Will continue the iron  supplementation.   #4: Metabolic acidosis: Metabolic acidosis secondary to chronic kidney disease.  Will continue the sodium bicarbonate.   #5: Second hyperparathyroidism: Patient has hypercalcemia and the calcitriol is on hold at this time.  #6: Hypertension: Will continue the metoprolol along with furosemide at the present doses.  #7: Diabetes: Continue insulin as ordered.   Will continue to monitor closely.    LOS: 3 Lorain Childes, MD [facility] 6/16/20247:15 PM

## 2023-06-07 NOTE — Progress Notes (Signed)
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 D/c IV heparin as per cardio. S/p cardiac stress test which showed old fixed defect as per cardio. Echo shows EF 30-35%, grade I diastolic dysfunction, regional wall motion abnormalities, mod MR. Morphine prn. Continue on tele. Cardio following and recs apprec    Acute on chronic diastolic CHF: continue on IV lasix. Monitor I/Os. BNP is elevated 2,261. Cardio recs apprec   Cardiomyopathy: etiology unclear, concern for ischemia given wall motion abnormality found on echo. S/p cardiac stress which showed an old fixed defect on 06/05/23 as per cardio    AKI on CKDIV: Cr is trending up daily. Likely needs HD. Nephro following and recs apprec   DM2: HbA1c 6.9, well controlled. Continue on SSI w/ accuchecks   Hypothyroidism: continue on home dose of levothyroxine   Depression: severity unknown. Continue on home dose of fluoxetine   Macrocytic anemia: folate is WNL. Continue on B12 supplement as B12 was on the low end of normal. Will transfuse if Hb < 7.0   ? Encephalopathy: etiology unclear, delirium vs dementia. Concerns of sundowning on 06/04/23 overnight. Pt denies any hx of memory problems or dementia    DVT prophylaxis: heparin  Code Status: full  Family Communication: discussed pt's care w/ pt's daughter in law, Claris Che [phone] ), and answered her question  Disposition  Plan: likely d/c home w/ home health   Level of care: Progressive Status is: Inpatient Remains inpatient appropriate because: severity of illness, Cr is rising daily & needs HD. Pt is unsure of HD. Pt may leave AMA     Consultants:  Cardio  Nephro   Procedures:   Antimicrobials:   Subjective: Pt c/o anxiety.   Objective: Vitals:   06/06/23 2059 06/06/23 2345 06/07/23 0415 06/07/23 0727  BP: (!) 111/53 (!) 112/53 (!) 118/55 119/60  Pulse: 74 78 80 81  Resp: 17 15 16 17   Temp: 98.1 F (36.7 C) 98.1 F (36.7 C) 98 F (36.7 C) 98.2 F (36.8 C)  TempSrc: Oral Oral Oral   SpO2: 99% 97% 100% 100%  Weight:    66.1 kg  Height:        Intake/Output Summary (Last 24 hours) at 06/07/2023 0806 Last data filed at 06/07/2023 0341 Gross per 24 hour  Intake 369.02 ml  Output 350 ml  Net 19.02 ml   Filed Weights   06/03/23 2350 06/07/23 0727  Weight: 67.1 kg 66.1 kg    Examination:  General exam: Appears anxious  Respiratory system: clear breath sounds b/l  Cardiovascular system: S1 & S2+. No rubs or clicks  Gastrointestinal system: Abd is soft, NT, ND & hypoactive bowel sounds Central nervous system: alert & awake. Moves all extremities  Psychiatry:anxious mood and affect      Data Reviewed: I have personally reviewed following labs and imaging studies  CBC: Recent Labs  Lab 06/03/23 2353 06/04/23 0408 06/05/23 5621 06/06/23 3086 06/07/23 5784  WBC 6.9 7.1 7.3 6.0 6.5  HGB 8.5* 8.2* 9.2* 7.6* 8.2*  HCT 27.6* 27.3* 29.6* 24.8* 26.6*  MCV 98.2 100.0 97.4 95.8 97.1  PLT 227 216 256 192 212   Basic Metabolic Panel: Recent Labs  Lab 06/03/23 2353 06/04/23 0408 06/05/23 0521 06/06/23 0436 06/07/23 0458  NA 141 141 141 139 136  K 4.8 4.5 4.7 4.7 5.0  CL 107 108 105 104 101  CO2 18* 17* 19* 20* 18*  GLUCOSE 172* 124* 124* 142* 126*  BUN 98* 99* 101* 91* 95*  CREATININE 6.24* 6.11* 6.22* 6.26* 6.48*  CALCIUM 9.5 9.5 10.5* 9.5 9.7   GFR: Estimated  Creatinine Clearance: 6.3 mL/min (A) (by C-G formula based on SCr of 6.48 mg/dL (H)). Liver Function Tests: No results for input(s): "AST", "ALT", "ALKPHOS", "BILITOT", "PROT", "ALBUMIN" in the last 168 hours. No results for input(s): "LIPASE", "AMYLASE" in the last 168 hours. No results for input(s): "AMMONIA" in the last 168 hours. Coagulation Profile: Recent Labs  Lab 06/04/23 0145  INR 1.2   Cardiac Enzymes: No results for input(s): "CKTOTAL", "CKMB", "CKMBINDEX", "TROPONINI" in the last 168 hours. BNP (last 3 results) No results for input(s): "PROBNP" in the last 8760 hours. HbA1C: No results for input(s): "HGBA1C" in the last 72 hours.  CBG: Recent Labs  Lab 06/05/23 2045 06/06/23 0837 06/06/23 1159 06/06/23 1706 06/06/23 2056  GLUCAP 140* 118* 202* 72 161*   Lipid Profile: Recent Labs    06/07/23 0458  CHOL 157  HDL 61  LDLCALC 70  TRIG 129  CHOLHDL 2.6   Thyroid Function Tests: No results for input(s): "TSH", "T4TOTAL", "FREET4", "T3FREE", "THYROIDAB" in the last 72 hours.  Anemia Panel: Recent Labs    06/05/23 0521  VITAMINB12 344  FOLATE 16.6   Sepsis Labs: No results for input(s): "PROCALCITON", "LATICACIDVEN" in the last 168 hours.  No results found for this or any previous visit (from the past 240 hour(s)).       Radiology Studies: NM Myocar Multi W/Spect W/Wall Motion / EF  Result Date: 06/05/2023 Pharmacological myocardial perfusion imaging study with no significant  ischemia Large fixed defect in the inferior and inferolateral wall consistent with prior MI Hypokinesis of the inferior and inferolateral wall, EF estimated at 31% EKG changes concerning for ischemia at peak stress/recovery (diffuse ST elevations in the inferior and anterolateral leads V3 through V6. CT attenuation correction images with mild diffuse aortic atherosclerosis, coronary calcification, predominantly in the RCA High risk scan in the setting of large infarct Signed, Dossie Arbour, MD, Ph.D [facility]   US RENAL  Result Date: 06/05/2023 CLINICAL DATA:  Acute renal injury EXAM: RENAL / URINARY TRACT ULTRASOUND COMPLETE COMPARISON:  None Available. FINDINGS: Right Kidney: Length = 7.7 cm Renal cortical thinning. Diffusely increased cortical echogenicity with preserved corticomedullary differentiation which can be seen with medical renal disease. No urinary tract dilation or shadowing calculi. The ureter is not seen. Left Kidney: Length = 8.3 cm Renal cortical thinning. Diffusely increased cortical echogenicity with diminished corticomedullary differentiation which can be seen with medical renal disease. Interpolar simple-appearing cyst measures 10 mm. No urinary tract dilation or shadowing calculi. The ureter is not seen. Bladder: Appears normal for degree of bladder distention. Other: None. IMPRESSION: 1. No urinary tract dilation or shadowing calculi. 2. Bilateral renal cortical thinning and increased cortical echogenicity which can be seen with medical renal disease. Electronically Signed   By: Agustin Cree M.D.   On: 06/05/2023 09:56  Scheduled Meds:  aspirin EC  325 mg Oral QHS   atorvastatin  80 mg Oral Daily   vitamin B-12  1,000 mcg Oral Daily   ferrous sulfate  325 mg Oral Q1200   FLUoxetine  10 mg Oral Once per day on Mon Wed Fri   furosemide  40 mg Intravenous BID   insulin aspart  0-15 Units Subcutaneous TID WC   insulin aspart  0-5 Units Subcutaneous QHS   levothyroxine  75 mcg Oral Q0600   loratadine  10 mg Oral Daily   metoprolol succinate  12.5 mg Oral Daily   sodium bicarbonate  650 mg Oral BID   Continuous Infusions:     LOS: 3 days    Time spent: 30 mins     Charise Killian, MD [facility] Pager [phone]  If 7PM-7AM, please contact night-coverage [email or web address] 06/07/2023, 8:06 AM

## 2023-06-08 NOTE — Discharge Summary (Signed)
Physician Discharge Summary  [name]:[id number] DOB: 1941-09-13 DOA: 06/04/2023  PCP: Renford Dills, MD  Admit date: 06/04/2023 Discharge date: 06/08/2023  Admitted From: home  Disposition:  Pt left AMA  Recommendations for Outpatient Follow-up:  Pt left AMA  Home Health: no  Equipment/Devices:  Discharge Condition: guarded  CODE STATUS:full  Diet recommendation: Heart Healthy / Carb Modified  Brief/Interim Summary: HPI was taken from Dr. Arville Care: Cassandra Baker is a 82 y.o. female with medical history significant for osteoarthritis, asthma, CHF, CKD, depression, type diabetes mellitus, hypertension and hypothyroidism who presented to the emergency room with acute onset worsening dyspnea with associated lower extremity edema as well as orthopnea and paroxysmal nocturnal dyspnea.  She has been having occasional nausea without vomiting or diarrhea or abdominal pain.  No chest pain or palpitations.  She admits to mild urinary urgency without dysuria or hematuria or flank pain.  No cough or wheezing or hemoptysis.   ED Course: When the patient came to the ER vital signs were within normal.  Labs revealed a blood glucose of 172 and BUN at 98 with a creatinine of 6.24 compared to 88/4.2 on 10/06/2019.-State troponin I was 986 and later 998.  BNP was 20-61.8.  CBC showed anemia with hemoglobin 8.5 hematocrit 27.6 worse than previous levels. EKG as reviewed by me : Normal sinus rhythm with a rate of 96 with Q waves inferiorly and laterally. Imaging: Portable chest x-ray showed increased markings in the lung bases that could reflect atelectasis or scarring.   The patient was given IV heparin bolus and drip, 4 of aspirin and 80 mg of IV Lasix.  She will be admitted to a progressive unit bed for further evaluation and management.   Cr was rising daily and HD was indicated as per nephro. Pt was unsure of agreeing to start HD. Pt decided to leave AMA.   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 D/c IV heparin as per cardio. S/p cardiac stress test which showed old fixed defect as per cardio. Echo shows EF 30-35%, grade I diastolic dysfunction, regional wall motion abnormalities, mod MR. Morphine prn. Continue on tele. Cardio following and recs apprec    Acute on chronic diastolic CHF: continue on IV lasix. Monitor I/Os. BNP is elevated 2,261. Cardio recs apprec    Cardiomyopathy: etiology unclear, concern for ischemia given wall motion abnormality found on echo. S/p cardiac stress which showed an old fixed defect on 06/05/23 as per cardio    AKI on CKDIV: Cr is trending up daily. Likely needs HD. Nephro following and recs apprec    DM2: HbA1c 6.9, well controlled. Continue on SSI w/ accuchecks    Hypothyroidism: continue on home dose of levothyroxine   Depression: severity unknown. Continue on home dose of fluoxetine    Macrocytic anemia: folate is WNL. Continue on B12 supplement as B12 was on the low end of normal. Will transfuse if Hb < 7.0    ? Encephalopathy: etiology unclear, delirium vs dementia. Concerns of sundowning on 06/04/23 overnight. Pt denies any hx of memory problems or  dementia    Discharge Instructions      Consultations: Nephro Cardio    Procedures/Studies: NM Myocar Multi W/Spect W/Wall Motion / EF  Result Date: 06/05/2023 Pharmacological myocardial perfusion imaging study with no significant  ischemia Large fixed defect in the inferior and inferolateral wall consistent with prior MI Hypokinesis of the  inferior and inferolateral wall, EF estimated at 31% EKG changes concerning for ischemia at peak stress/recovery (diffuse ST elevations in the inferior and anterolateral leads V3 through V6. CT attenuation correction images with mild diffuse aortic atherosclerosis, coronary calcification, predominantly in the RCA High risk scan in the setting of large infarct Signed, Dossie Arbour, MD, Ph.D [facility]   US RENAL  Result Date: 06/05/2023 CLINICAL DATA:  Acute renal injury EXAM: RENAL / URINARY TRACT ULTRASOUND COMPLETE COMPARISON:  None Available. FINDINGS: Right Kidney: Length = 7.7 cm Renal cortical thinning. Diffusely increased cortical echogenicity with preserved corticomedullary differentiation which can be seen with medical renal disease. No urinary tract dilation or shadowing calculi. The ureter is not seen. Left Kidney: Length = 8.3 cm Renal cortical thinning. Diffusely increased cortical echogenicity with diminished corticomedullary differentiation which can be seen with medical renal disease. Interpolar simple-appearing cyst measures 10 mm. No urinary tract dilation or shadowing calculi. The ureter is not seen. Bladder: Appears normal for degree of bladder distention. Other: None. IMPRESSION: 1. No urinary tract dilation or shadowing calculi. 2. Bilateral renal cortical thinning and increased cortical echogenicity which can be seen with medical renal disease. Electronically Signed   By: Agustin Cree M.D.   On: 06/05/2023 09:56   ECHOCARDIOGRAM COMPLETE  Result Date: 06/04/2023    ECHOCARDIOGRAM REPORT   Patient Name:   Cassandra Baker Date of Exam: 06/04/2023 Medical Rec #:  [id number]      Height:       64.0 in Accession #:    [id number]     Weight:       148.0 lb Date of Birth:  06/23/1941      BSA:          1.721 m Patient Age:    82 years       BP:           124/74 mmHg Patient Gender: F              HR:           97 bpm. Exam Location:  [facility] Procedure: 2D Echo, Cardiac Doppler, Color Doppler and  Strain Analysis Indications:     CHF, NSTEMI  History:         Patient has no prior history of Echocardiogram examinations.                  CHF, Acute MI; Risk Factors:Diabetes. CKD.  Sonographer:     Mikki Harbor Referring Phys:  [id number] JAN A MANSY Diagnosing Phys: Julien Nordmann MD  Sonographer Comments: Global longitudinal strain was attempted. IMPRESSIONS  1. Left ventricular ejection fraction, by estimation, is 30 to 35%. The left ventricle has moderately decreased function. The left ventricle demonstrates mild global hypokinesis with severe hypokinesis of the inferior/posterior/septal wall. There is mild left ventricular hypertrophy. Left ventricular diastolic parameters are consistent with Grade I diastolic dysfunction (impaired relaxation).  2. Right ventricular systolic function is normal. The right ventricular size is normal.  3. The mitral valve is normal in structure. Moderate mitral valve regurgitation. No evidence of mitral stenosis. Moderate mitral annular calcification.  4. The aortic valve is normal  in structure. Aortic valve regurgitation is not visualized. No aortic stenosis is present. Aortic valve mean gradient measures 8.0 mmHg.  5. The inferior vena cava is normal in size with greater than 50% respiratory variability, suggesting right atrial pressure of 3 mmHg. FINDINGS  Left Ventricle: Left ventricular ejection fraction, by estimation, is 30 to 35%. The left ventricle has moderately decreased function. The left ventricle demonstrates global hypokinesis. The average left ventricular global longitudinal strain is -4.4 %.  The left ventricular internal cavity size was normal in size. There is mild left ventricular hypertrophy. Left ventricular diastolic parameters are consistent with Grade I diastolic dysfunction (impaired relaxation). Right Ventricle: The right ventricular size is normal. No increase in right ventricular wall thickness. Right ventricular systolic function is normal. Left  Atrium: Left atrial size was normal in size. Right Atrium: Right atrial size was normal in size. Pericardium: There is no evidence of pericardial effusion. Mitral Valve: The mitral valve is normal in structure. Moderate mitral annular calcification. Moderate mitral valve regurgitation. No evidence of mitral valve stenosis. MV peak gradient, 5.0 mmHg. The mean mitral valve gradient is 2.0 mmHg. Tricuspid Valve: The tricuspid valve is normal in structure. Tricuspid valve regurgitation is not demonstrated. No evidence of tricuspid stenosis. Aortic Valve: The aortic valve is normal in structure. Aortic valve regurgitation is not visualized. No aortic stenosis is present. Aortic valve mean gradient measures 8.0 mmHg. Aortic valve peak gradient measures 13.1 mmHg. Aortic valve area, by VTI measures 1.57 cm. Pulmonic Valve: The pulmonic valve was normal in structure. Pulmonic valve regurgitation is not visualized. No evidence of pulmonic stenosis. Aorta: The aortic root is normal in size and structure. Venous: The inferior vena cava is normal in size with greater than 50% respiratory variability, suggesting right atrial pressure of 3 mmHg. IAS/Shunts: The interatrial septum appears to be lipomatous. No atrial level shunt detected by color flow Doppler.  LEFT VENTRICLE PLAX 2D LVIDd:         4.90 cm     Diastology LVIDs:         4.20 cm     LV e' medial:    5.44 cm/s LV PW:         1.50 cm     LV E/e' medial:  21.1 LV IVS:        1.10 cm     LV e' lateral:   6.96 cm/s LVOT diam:     2.00 cm     LV E/e' lateral: 16.5 LV SV:         63 LV SV Index:   37          2D Longitudinal Strain LVOT Area:     3.14 cm    2D Strain GLS Avg:     -4.4 %  LV Volumes (MOD) LV vol d, MOD A2C: 85.3 ml LV vol d, MOD A4C: 73.8 ml LV vol s, MOD A2C: 48.3 ml LV vol s, MOD A4C: 37.4 ml LV SV MOD A2C:     37.0 ml LV SV MOD A4C:     73.8 ml LV SV MOD BP:      38.6 ml RIGHT VENTRICLE RV Basal diam:  3.25 cm RV Mid diam:    2.50 cm RV S prime:      13.10 cm/s TAPSE (M-mode): 2.7 cm LEFT ATRIUM             Index        RIGHT ATRIUM  Index LA diam:        3.90 cm 2.27 cm/m   RA Area:     12.70 cm LA Vol (A2C):   41.4 ml 24.05 ml/m  RA Volume:   31.80 ml  18.47 ml/m LA Vol (A4C):   58.4 ml 33.93 ml/m LA Biplane Vol: 49.8 ml 28.93 ml/m  AORTIC VALVE                     PULMONIC VALVE AV Area (Vmax):    1.69 cm      PV Vmax:       1.17 m/s AV Area (Vmean):   1.37 cm      PV Peak grad:  5.5 mmHg AV Area (VTI):     1.57 cm AV Vmax:           181.00 cm/s AV Vmean:          139.000 cm/s AV VTI:            0.405 m AV Peak Grad:      13.1 mmHg AV Mean Grad:      8.0 mmHg LVOT Vmax:         97.40 cm/s LVOT Vmean:        60.600 cm/s LVOT VTI:          0.202 m LVOT/AV VTI ratio: 0.50  AORTA Ao Root diam: 2.80 cm MITRAL VALVE MV Area (PHT): 5.16 cm       SHUNTS MV Area VTI:   2.11 cm       Systemic VTI:  0.20 m MV Peak grad:  5.0 mmHg       Systemic Diam: 2.00 cm MV Mean grad:  2.0 mmHg MV Vmax:       1.12 m/s MV Vmean:      63.1 cm/s MV Decel Time: 147 msec MR Peak grad:    129.0 mmHg MR Mean grad:    88.0 mmHg MR Vmax:         568.00 cm/s MR Vmean:        441.0 cm/s MR PISA:         1.01 cm MR PISA Eff ROA: 16 mm MR PISA Radius:  0.40 cm MV E velocity: 115.00 cm/s MV A velocity: 125.00 cm/s MV E/A ratio:  0.92 Julien Nordmann MD Electronically signed by Julien Nordmann MD Signature Date/Time: 06/04/2023/4:58:01 PM    Final    DG Chest 1 View  Result Date: 06/04/2023 CLINICAL DATA:  Shortness of breath EXAM: CHEST  1 VIEW COMPARISON:  03/11/2018 FINDINGS: There is hyperinflation of the lungs compatible with COPD. Increased markings in the lung bases. No effusions or pneumothorax. Heart mediastinal contours within normal limits. Aortic atherosclerosis. IMPRESSION: COPD. Increased markings in the lung bases could reflect atelectasis or scarring. Electronically Signed   By: Charlett Nose M.D.   On: 06/04/2023 00:34   (Echo, Carotid, EGD, Colonoscopy,  ERCP)    Subjective: Pt c/o anxiety    Discharge Exam: Vitals:   06/07/23 1617 06/07/23 1618  BP: 120/67 124/62  Pulse: 82 83  Resp: 16 16  Temp: 97.9 F (36.6 C) 97.9 F (36.6 C)  SpO2: 100% 100%   Vitals:   06/07/23 0727 06/07/23 1340 06/07/23 1617 06/07/23 1618  BP: 119/60 (!) 119/56 120/67 124/62  Pulse: 81 79 82 83  Resp: 17 16 16 16   Temp: 98.2 F (36.8 C) 97.7 F (36.5 C) 97.9 F (36.6 C) 97.9 F (36.6 C)  TempSrc:      SpO2: 100% 100% 100% 100%  Weight: 66.1 kg     Height:        No PE as pt left AMA.    The results of significant diagnostics from this hospitalization (including imaging, microbiology, ancillary and laboratory) are listed below for reference.     Microbiology: No results found for this or any previous visit (from the past 240 hour(s)).   Labs: BNP (last 3 results) Recent Labs    06/03/23 2353  BNP 2,261.8*   Basic Metabolic Panel: Recent Labs  Lab 06/03/23 2353 06/04/23 0408 06/05/23 0521 06/06/23 0436 06/07/23 0458  NA 141 141 141 139 136  K 4.8 4.5 4.7 4.7 5.0  CL 107 108 105 104 101  CO2 18* 17* 19* 20* 18*  GLUCOSE 172* 124* 124* 142* 126*  BUN 98* 99* 101* 91* 95*  CREATININE 6.24* 6.11* 6.22* 6.26* 6.48*  CALCIUM 9.5 9.5 10.5* 9.5 9.7   Liver Function Tests: No results for input(s): "AST", "ALT", "ALKPHOS", "BILITOT", "PROT", "ALBUMIN" in the last 168 hours. No results for input(s): "LIPASE", "AMYLASE" in the last 168 hours. No results for input(s): "AMMONIA" in the last 168 hours. CBC: Recent Labs  Lab 06/03/23 2353 06/04/23 0408 06/05/23 0521 06/06/23 0436 06/07/23 0458  WBC 6.9 7.1 7.3 6.0 6.5  HGB 8.5* 8.2* 9.2* 7.6* 8.2*  HCT 27.6* 27.3* 29.6* 24.8* 26.6*  MCV 98.2 100.0 97.4 95.8 97.1  PLT 227 216 256 192 212   Cardiac Enzymes: No results for input(s): "CKTOTAL", "CKMB", "CKMBINDEX", "TROPONINI" in the last 168 hours. BNP: Invalid input(s): "POCBNP" CBG: Recent Labs  Lab 06/06/23 1706  06/06/23 2056 06/07/23 0738 06/07/23 1342 06/07/23 1620  GLUCAP 72 161* 135* 110* 126*   D-Dimer No results for input(s): "DDIMER" in the last 72 hours. Hgb A1c No results for input(s): "HGBA1C" in the last 72 hours. Lipid Profile Recent Labs    06/07/23 0458  CHOL 157  HDL 61  LDLCALC 70  TRIG 129  CHOLHDL 2.6   Thyroid function studies No results for input(s): "TSH", "T4TOTAL", "T3FREE", "THYROIDAB" in the last 72 hours.  Invalid input(s): "FREET3" Anemia work up No results for input(s): "VITAMINB12", "FOLATE", "FERRITIN", "TIBC", "IRON", "RETICCTPCT" in the last 72 hours. Urinalysis    Component Value Date/Time   COLORURINE YELLOW 02/04/2018 1104   APPEARANCEUR CLOUDY (A) 02/04/2018 1104   APPEARANCEUR Clear 03/03/2014 1045   LABSPEC 1.009 02/04/2018 1104   LABSPEC 1.005 03/03/2014 1045   PHURINE 6.0 02/04/2018 1104   GLUCOSEU 50 (A) 02/04/2018 1104   GLUCOSEU 50 mg/dL 16/09/9603 5409   HGBUR SMALL (A) 02/04/2018 1104   BILIRUBINUR NEGATIVE 02/04/2018 1104   BILIRUBINUR Negative 03/03/2014 1045   KETONESUR NEGATIVE 02/04/2018 1104   PROTEINUR 100 (A) 02/04/2018 1104   NITRITE NEGATIVE 02/04/2018 1104   LEUKOCYTESUR LARGE (A) 02/04/2018 1104   LEUKOCYTESUR 3+ 03/03/2014 1045   Sepsis Labs Recent Labs  Lab 06/04/23 0408 06/05/23 0521 06/06/23 0436 06/07/23 0458  WBC 7.1 7.3 6.0 6.5   Microbiology No results found for this or any previous visit (from the past 240 hour(s)).   Time coordinating discharge: Over 30 minutes  SIGNED:   Charise Killian, MD  [facility] 06/08/2023, 2:58 PM Pager   If 7PM-7AM, please contact night-coverage [email or web address]

## 2023-06-10 DIAGNOSIS — I214 Non-ST elevation (NSTEMI) myocardial infarction: Secondary | ICD-10-CM | POA: Diagnosis not present

## 2023-06-10 DIAGNOSIS — N185 Chronic kidney disease, stage 5: Secondary | ICD-10-CM | POA: Diagnosis not present

## 2023-06-10 DIAGNOSIS — D649 Anemia, unspecified: Secondary | ICD-10-CM | POA: Diagnosis not present

## 2023-06-11 ENCOUNTER — Inpatient Hospital Stay
Admission: EM | Admit: 2023-06-11 | Discharge: 2023-06-16 | DRG: 280 | Disposition: A | Discharge status: Other Acute Inpatient Hospital | Payer: Medicare Other | Attending: Internal Medicine | Admitting: Internal Medicine

## 2023-06-11 ENCOUNTER — Other Ambulatory Visit: Payer: Self-pay

## 2023-06-11 ENCOUNTER — Emergency Department: Payer: Medicare Other

## 2023-06-11 DIAGNOSIS — I420 Dilated cardiomyopathy: Secondary | ICD-10-CM | POA: Diagnosis present

## 2023-06-11 DIAGNOSIS — E1122 Type 2 diabetes mellitus with diabetic chronic kidney disease: Secondary | ICD-10-CM | POA: Diagnosis present

## 2023-06-11 DIAGNOSIS — I2510 Atherosclerotic heart disease of native coronary artery without angina pectoris: Secondary | ICD-10-CM

## 2023-06-11 DIAGNOSIS — I10 Essential (primary) hypertension: Secondary | ICD-10-CM | POA: Diagnosis not present

## 2023-06-11 DIAGNOSIS — J45909 Unspecified asthma, uncomplicated: Secondary | ICD-10-CM | POA: Diagnosis not present

## 2023-06-11 DIAGNOSIS — N2581 Secondary hyperparathyroidism of renal origin: Secondary | ICD-10-CM | POA: Diagnosis not present

## 2023-06-11 DIAGNOSIS — Z7984 Long term (current) use of oral hypoglycemic drugs: Secondary | ICD-10-CM

## 2023-06-11 DIAGNOSIS — R55 Syncope and collapse: Secondary | ICD-10-CM | POA: Diagnosis not present

## 2023-06-11 DIAGNOSIS — I340 Nonrheumatic mitral (valve) insufficiency: Secondary | ICD-10-CM | POA: Diagnosis present

## 2023-06-11 DIAGNOSIS — Z8049 Family history of malignant neoplasm of other genital organs: Secondary | ICD-10-CM

## 2023-06-11 DIAGNOSIS — Z880 Allergy status to penicillin: Secondary | ICD-10-CM

## 2023-06-11 DIAGNOSIS — Z79890 Hormone replacement therapy (postmenopausal): Secondary | ICD-10-CM

## 2023-06-11 DIAGNOSIS — I132 Hypertensive heart and chronic kidney disease with heart failure and with stage 5 chronic kidney disease, or end stage renal disease: Secondary | ICD-10-CM | POA: Diagnosis not present

## 2023-06-11 DIAGNOSIS — I4729 Other ventricular tachycardia: Secondary | ICD-10-CM

## 2023-06-11 DIAGNOSIS — D638 Anemia in other chronic diseases classified elsewhere: Secondary | ICD-10-CM | POA: Diagnosis not present

## 2023-06-11 DIAGNOSIS — I255 Ischemic cardiomyopathy: Secondary | ICD-10-CM | POA: Diagnosis present

## 2023-06-11 DIAGNOSIS — N186 End stage renal disease: Secondary | ICD-10-CM

## 2023-06-11 DIAGNOSIS — E119 Type 2 diabetes mellitus without complications: Secondary | ICD-10-CM

## 2023-06-11 DIAGNOSIS — I3481 Nonrheumatic mitral (valve) annulus calcification: Secondary | ICD-10-CM | POA: Diagnosis present

## 2023-06-11 DIAGNOSIS — E8779 Other fluid overload: Secondary | ICD-10-CM

## 2023-06-11 DIAGNOSIS — B9620 Unspecified Escherichia coli [E. coli] as the cause of diseases classified elsewhere: Secondary | ICD-10-CM | POA: Diagnosis not present

## 2023-06-11 DIAGNOSIS — E871 Hypo-osmolality and hyponatremia: Secondary | ICD-10-CM | POA: Diagnosis not present

## 2023-06-11 DIAGNOSIS — Z992 Dependence on renal dialysis: Secondary | ICD-10-CM

## 2023-06-11 DIAGNOSIS — R262 Difficulty in walking, not elsewhere classified: Secondary | ICD-10-CM | POA: Diagnosis present

## 2023-06-11 DIAGNOSIS — I472 Ventricular tachycardia, unspecified: Secondary | ICD-10-CM | POA: Diagnosis not present

## 2023-06-11 DIAGNOSIS — F32A Depression, unspecified: Secondary | ICD-10-CM | POA: Diagnosis present

## 2023-06-11 DIAGNOSIS — I5020 Unspecified systolic (congestive) heart failure: Secondary | ICD-10-CM | POA: Diagnosis not present

## 2023-06-11 DIAGNOSIS — R253 Fasciculation: Secondary | ICD-10-CM | POA: Diagnosis not present

## 2023-06-11 DIAGNOSIS — Z79899 Other long term (current) drug therapy: Secondary | ICD-10-CM

## 2023-06-11 DIAGNOSIS — E039 Hypothyroidism, unspecified: Secondary | ICD-10-CM | POA: Diagnosis present

## 2023-06-11 DIAGNOSIS — E7800 Pure hypercholesterolemia, unspecified: Secondary | ICD-10-CM | POA: Diagnosis not present

## 2023-06-11 DIAGNOSIS — E8721 Acute metabolic acidosis: Secondary | ICD-10-CM | POA: Diagnosis present

## 2023-06-11 DIAGNOSIS — Z85828 Personal history of other malignant neoplasm of skin: Secondary | ICD-10-CM

## 2023-06-11 DIAGNOSIS — Z8249 Family history of ischemic heart disease and other diseases of the circulatory system: Secondary | ICD-10-CM

## 2023-06-11 DIAGNOSIS — D631 Anemia in chronic kidney disease: Secondary | ICD-10-CM | POA: Diagnosis not present

## 2023-06-11 DIAGNOSIS — I5021 Acute systolic (congestive) heart failure: Secondary | ICD-10-CM | POA: Diagnosis present

## 2023-06-11 DIAGNOSIS — M1990 Unspecified osteoarthritis, unspecified site: Secondary | ICD-10-CM | POA: Diagnosis present

## 2023-06-11 DIAGNOSIS — E8770 Fluid overload, unspecified: Secondary | ICD-10-CM | POA: Diagnosis not present

## 2023-06-11 DIAGNOSIS — M109 Gout, unspecified: Secondary | ICD-10-CM | POA: Insufficient documentation

## 2023-06-11 DIAGNOSIS — I272 Pulmonary hypertension, unspecified: Secondary | ICD-10-CM | POA: Diagnosis present

## 2023-06-11 DIAGNOSIS — R600 Localized edema: Secondary | ICD-10-CM | POA: Diagnosis not present

## 2023-06-11 DIAGNOSIS — R008 Other abnormalities of heart beat: Secondary | ICD-10-CM | POA: Diagnosis not present

## 2023-06-11 DIAGNOSIS — R8281 Pyuria: Secondary | ICD-10-CM | POA: Diagnosis not present

## 2023-06-11 DIAGNOSIS — N189 Chronic kidney disease, unspecified: Secondary | ICD-10-CM | POA: Diagnosis not present

## 2023-06-11 DIAGNOSIS — I493 Ventricular premature depolarization: Secondary | ICD-10-CM | POA: Diagnosis not present

## 2023-06-11 DIAGNOSIS — I4721 Torsades de pointes: Secondary | ICD-10-CM | POA: Diagnosis not present

## 2023-06-11 DIAGNOSIS — I214 Non-ST elevation (NSTEMI) myocardial infarction: Secondary | ICD-10-CM | POA: Diagnosis not present

## 2023-06-11 DIAGNOSIS — R0602 Shortness of breath: Secondary | ICD-10-CM | POA: Diagnosis not present

## 2023-06-11 DIAGNOSIS — I120 Hypertensive chronic kidney disease with stage 5 chronic kidney disease or end stage renal disease: Secondary | ICD-10-CM | POA: Diagnosis not present

## 2023-06-11 DIAGNOSIS — N179 Acute kidney failure, unspecified: Secondary | ICD-10-CM | POA: Diagnosis not present

## 2023-06-11 DIAGNOSIS — Z90710 Acquired absence of both cervix and uterus: Secondary | ICD-10-CM

## 2023-06-11 DIAGNOSIS — N390 Urinary tract infection, site not specified: Secondary | ICD-10-CM | POA: Diagnosis present

## 2023-06-11 DIAGNOSIS — I5043 Acute on chronic combined systolic (congestive) and diastolic (congestive) heart failure: Secondary | ICD-10-CM | POA: Diagnosis present

## 2023-06-11 DIAGNOSIS — I509 Heart failure, unspecified: Secondary | ICD-10-CM | POA: Diagnosis not present

## 2023-06-11 DIAGNOSIS — F419 Anxiety disorder, unspecified: Secondary | ICD-10-CM | POA: Diagnosis present

## 2023-06-11 DIAGNOSIS — Z823 Family history of stroke: Secondary | ICD-10-CM

## 2023-06-11 DIAGNOSIS — N185 Chronic kidney disease, stage 5: Secondary | ICD-10-CM | POA: Diagnosis not present

## 2023-06-11 LAB — COMPREHENSIVE METABOLIC PANEL
ALT: 28 U/L (ref 0–44)
AST: 15 U/L (ref 15–41)
Albumin: 3.7 g/dL (ref 3.5–5.0)
Alkaline Phosphatase: 106 U/L (ref 38–126)
Anion gap: 15 (ref 5–15)
BUN: 97 mg/dL — ABNORMAL HIGH (ref 8–23)
CO2: 21 mmol/L — ABNORMAL LOW (ref 22–32)
Calcium: 9.7 mg/dL (ref 8.9–10.3)
Chloride: 95 mmol/L — ABNORMAL LOW (ref 98–111)
Creatinine, Ser: 6.45 mg/dL — ABNORMAL HIGH (ref 0.44–1.00)
GFR, Estimated: 6 mL/min — ABNORMAL LOW (ref >60)
Glucose, Bld: 146 mg/dL — ABNORMAL HIGH (ref 70–99)
Potassium: 5.1 mmol/L (ref 3.5–5.1)
Sodium: 131 mmol/L — ABNORMAL LOW (ref 135–145)
Total Bilirubin: 0.6 mg/dL (ref 0.3–1.2)
Total Protein: 6.6 g/dL (ref 6.5–8.1)

## 2023-06-11 LAB — BRAIN NATRIURETIC PEPTIDE: B Natriuretic Peptide: 2338.2 pg/mL — ABNORMAL HIGH (ref 0.0–100.0)

## 2023-06-11 LAB — URINALYSIS, ROUTINE W REFLEX MICROSCOPIC
Bilirubin Urine: NEGATIVE
Glucose, UA: 50 mg/dL — AB
Hgb urine dipstick: NEGATIVE
Ketones, ur: NEGATIVE mg/dL
Nitrite: NEGATIVE
Protein, ur: 100 mg/dL — AB
Specific Gravity, Urine: 1.006 (ref 1.005–1.030)
WBC, UA: >50 WBC/hpf (ref 0–5)
pH: 7 (ref 5.0–8.0)

## 2023-06-11 LAB — CBC
HCT: 26.7 % — ABNORMAL LOW (ref 36.0–46.0)
Hemoglobin: 8.3 g/dL — ABNORMAL LOW (ref 12.0–15.0)
MCH: 30.1 pg (ref 26.0–34.0)
MCHC: 31.1 g/dL (ref 30.0–36.0)
MCV: 96.7 fL (ref 80.0–100.0)
Platelets: 200 10*3/uL (ref 150–400)
RBC: 2.76 MIL/uL — ABNORMAL LOW (ref 3.87–5.11)
RDW: 13.2 % (ref 11.5–15.5)
WBC: 8.8 10*3/uL (ref 4.0–10.5)
nRBC: 0 % (ref 0.0–0.2)

## 2023-06-11 LAB — GLUCOSE, CAPILLARY: Glucose-Capillary: 122 mg/dL — ABNORMAL HIGH (ref 70–99)

## 2023-06-11 LAB — CBG MONITORING, ED: Glucose-Capillary: 113 mg/dL — ABNORMAL HIGH (ref 70–99)

## 2023-06-11 MED ORDER — METOPROLOL SUCCINATE ER 25 MG PO TB24
12.5000 mg | ORAL_TABLET | Freq: Every day | ORAL | Status: DC
  Administered 2023-06-12 – 2023-06-13 (×2): 12.5 mg via ORAL
  Filled 2023-06-11 (×2): qty 1

## 2023-06-11 MED ORDER — FUROSEMIDE 10 MG/ML IJ SOLN
120.0000 mg | Freq: Once | INTRAVENOUS | Status: AC
  Administered 2023-06-11: 120 mg via INTRAVENOUS
  Filled 2023-06-11: qty 10

## 2023-06-11 MED ORDER — INSULIN ASPART 100 UNIT/ML IJ SOLN
0.0000 [IU] | Freq: Three times a day (TID) | INTRAMUSCULAR | Status: DC
  Administered 2023-06-13 – 2023-06-14 (×2): 1 [IU] via SUBCUTANEOUS
  Administered 2023-06-16: 2 [IU] via SUBCUTANEOUS
  Filled 2023-06-11 (×4): qty 1

## 2023-06-11 MED ORDER — FLUOXETINE HCL 10 MG PO CAPS
10.0000 mg | ORAL_CAPSULE | ORAL | Status: DC
  Administered 2023-06-12 – 2023-06-15 (×2): 10 mg via ORAL
  Filled 2023-06-11 (×2): qty 1

## 2023-06-11 MED ORDER — ONDANSETRON HCL 4 MG PO TABS: 4.0000 mg | ORAL_TABLET | Freq: Four times a day (QID) | ORAL | Status: DC | PRN

## 2023-06-11 MED ORDER — FUROSEMIDE 10 MG/ML IJ SOLN: 80.0000 mg | Freq: Once | INTRAMUSCULAR | Status: DC

## 2023-06-11 MED ORDER — BUSPIRONE HCL 5 MG PO TABS
2.5000 mg | ORAL_TABLET | Freq: Three times a day (TID) | ORAL | Status: DC | PRN
  Administered 2023-06-11 – 2023-06-15 (×6): 2.5 mg via ORAL
  Filled 2023-06-11 (×5): qty 0.5
  Filled 2023-06-11: qty 1
  Filled 2023-06-11: qty 0.5

## 2023-06-11 MED ORDER — LEVOTHYROXINE SODIUM 50 MCG PO TABS
75.0000 ug | ORAL_TABLET | Freq: Every day | ORAL | Status: DC
  Administered 2023-06-13 – 2023-06-16 (×4): 75 ug via ORAL
  Filled 2023-06-11 (×4): qty 2

## 2023-06-11 MED ORDER — SODIUM CHLORIDE 0.9 % IV SOLN
2.0000 g | Freq: Once | INTRAVENOUS | Status: AC
  Administered 2023-06-11: 2 g via INTRAVENOUS
  Filled 2023-06-11: qty 20

## 2023-06-11 MED ORDER — ATORVASTATIN CALCIUM 20 MG PO TABS
80.0000 mg | ORAL_TABLET | Freq: Every day | ORAL | Status: DC
  Administered 2023-06-12 – 2023-06-16 (×5): 80 mg via ORAL
  Filled 2023-06-11 (×5): qty 4

## 2023-06-11 MED ORDER — ACETAMINOPHEN 650 MG RE SUPP: 650.0000 mg | Freq: Four times a day (QID) | RECTAL | Status: DC | PRN

## 2023-06-11 MED ORDER — HEPARIN SODIUM (PORCINE) 5000 UNIT/ML IJ SOLN
5000.0000 [IU] | Freq: Three times a day (TID) | INTRAMUSCULAR | Status: AC
  Administered 2023-06-11: 5000 [IU] via SUBCUTANEOUS
  Filled 2023-06-11: qty 1

## 2023-06-11 MED ORDER — ASPIRIN 81 MG PO TBEC
81.0000 mg | DELAYED_RELEASE_TABLET | Freq: Every day | ORAL | Status: DC
  Administered 2023-06-11 – 2023-06-16 (×6): 81 mg via ORAL
  Filled 2023-06-11 (×6): qty 1

## 2023-06-11 MED ORDER — ADULT MULTIVITAMIN W/MINERALS CH
1.0000 | ORAL_TABLET | Freq: Every day | ORAL | Status: DC
  Administered 2023-06-12 – 2023-06-16 (×5): 1 via ORAL
  Filled 2023-06-11 (×5): qty 1

## 2023-06-11 MED ORDER — ACETAMINOPHEN 325 MG PO TABS
650.0000 mg | ORAL_TABLET | Freq: Four times a day (QID) | ORAL | Status: DC | PRN
  Administered 2023-06-11 – 2023-06-14 (×4): 650 mg via ORAL
  Filled 2023-06-11 (×4): qty 2

## 2023-06-11 MED ORDER — SODIUM BICARBONATE 650 MG PO TABS
650.0000 mg | ORAL_TABLET | Freq: Two times a day (BID) | ORAL | Status: DC
  Administered 2023-06-11 – 2023-06-16 (×10): 650 mg via ORAL
  Filled 2023-06-11 (×11): qty 1

## 2023-06-11 MED ORDER — ONDANSETRON HCL 4 MG/2ML IJ SOLN: 4.0000 mg | Freq: Four times a day (QID) | INTRAMUSCULAR | Status: DC | PRN

## 2023-06-11 MED ORDER — FUROSEMIDE 10 MG/ML IJ SOLN
40.0000 mg | Freq: Two times a day (BID) | INTRAMUSCULAR | Status: DC
  Administered 2023-06-12 – 2023-06-15 (×7): 40 mg via INTRAVENOUS
  Filled 2023-06-11 (×7): qty 4

## 2023-06-11 NOTE — Assessment & Plan Note (Signed)
Patient has a history of CAD, recently admitted for NSTEMI.  Stress test demonstrated fixed ischemic defect.  Left heart cath deferred due to renal failure.  Cardiology recommending outpatient follow-up at that time.  - Restart aspirin - Continue home metoprolol

## 2023-06-11 NOTE — Assessment & Plan Note (Signed)
-   Resume home metoprolol 

## 2023-06-11 NOTE — ED Notes (Signed)
Pt A&O x4, refusing to be placed on bedside monitor at this time

## 2023-06-11 NOTE — ED Triage Notes (Signed)
Pt here with leg swelling. Pt was here last Wed and was supposed to be admitted and left AMA but states now her symptoms are getting worse. Pt states both of her legs are getting more swollen. Pt denies pain.

## 2023-06-11 NOTE — Assessment & Plan Note (Addendum)
Patient is presenting with hypervolemia in the setting of acute on chronic HFrEF exacerbated by poor urine output secondary to ESRD.  BNP markedly elevated at 2338, increased from 8 days prior at which time is 2261.  She has had some urine output with Lasix 120 mg IV.  - Telemetry monitoring - Strict in and out - Daily weights - Continue with Lasix IV 40 mg twice daily, at least until dialysis is initiated - Continue home metoprolol

## 2023-06-11 NOTE — Assessment & Plan Note (Signed)
-   Hold home Januvia - SSI, very sensitive

## 2023-06-11 NOTE — Assessment & Plan Note (Addendum)
-   Resume home fluoxetine - As needed BuSpar for anxiety

## 2023-06-11 NOTE — ED Provider Notes (Signed)
[facility] Provider Note    Event Date/Time   First MD Initiated Contact with Patient 06/11/23 1505     (approximate)   History   Leg Swelling   HPI  LORISSA GUADERRAMA is a 82 y.o. female  here with leg swelling, SOB. Pt has CKD IV, was just hospitalized for leg swelling, SOB and was being evaluated for dialysis. However, she felt better with IV lasix and left AMA. Since then, she has had recurrent swelling in her legs and says she is now amenable to dialysis. She has been taking lasix 80 mg PO BID for the last 2 days w/o relief. She has had SOB with exertion and lying flat. She has produced minimal urine. No fevers, chills. No other complaints.      Physical Exam   Triage Vital Signs: ED Triage Vitals  Enc Vitals Group     BP 06/11/23 1322 131/67     Pulse Rate 06/11/23 1322 85     Resp 06/11/23 1322 16     Temp 06/11/23 1322 98.4 F (36.9 C)     Temp Source 06/11/23 1322 Oral     SpO2 06/11/23 1322 100 %     Weight 06/11/23 1321 145 lb 11.6 oz (66.1 kg)     Height 06/11/23 1321 5\' 4"  (1.626 m)     Head Circumference --      Peak Flow --      Pain Score 06/11/23 1321 0     Pain Loc --      Pain Edu? --      Excl. in GC? --     Most recent vital signs: Vitals:   06/11/23 1322  BP: 131/67  Pulse: 85  Resp: 16  Temp: 98.4 F (36.9 C)  SpO2: 100%     General: Awake, no distress.  CV:  Good peripheral perfusion. RRR. Resp:  Normal work of breathing. Mild basilar rales noted, normal WOB. Abd:  No distention. No tenderness. Other:  2+ pitting edema bl LE.   ED Results / Procedures / Treatments   Labs (all labs ordered are listed, but only abnormal results are displayed) Labs Reviewed  COMPREHENSIVE METABOLIC PANEL - Abnormal; Notable for the following components:      Result Value   Sodium 131 (*)    Chloride 95 (*)    CO2 21 (*)    Glucose, Bld 146 (*)    BUN 97 (*)    Creatinine, Ser 6.45 (*)    GFR, Estimated 6 (*)    All  other components within normal limits  CBC - Abnormal; Notable for the following components:   RBC 2.76 (*)    Hemoglobin 8.3 (*)    HCT 26.7 (*)    All other components within normal limits  URINALYSIS, ROUTINE W REFLEX MICROSCOPIC - Abnormal; Notable for the following components:   Color, Urine STRAW (*)    APPearance HAZY (*)    Glucose, UA 50 (*)    Protein, ur 100 (*)    Leukocytes,Ua LARGE (*)    Bacteria, UA MANY (*)    All other components within normal limits  BRAIN NATRIURETIC PEPTIDE - Abnormal; Notable for the following components:   B Natriuretic Peptide 2,338.2 (*)    All other components within normal limits  URINE CULTURE     EKG Normal sinus rhythm, VR 83. PR 178, QRS 92, QTc 432. No acute ST elevations, ischemia, or infarct.   RADIOLOGY  I also independently reviewed and agree with radiologist interpretations.   PROCEDURES:  Critical Care performed: No   MEDICATIONS ORDERED IN ED: Medications  furosemide (LASIX) 120 mg in dextrose 5 % 50 mL IVPB (has no administration in time range)  cefTRIAXone (ROCEPHIN) 2 g in sodium chloride 0.9 % 100 mL IVPB (has no administration in time range)     IMPRESSION / MDM / ASSESSMENT AND PLAN / ED COURSE  I reviewed the triage vital signs and the nursing notes.                              Differential diagnosis includes, but is not limited to, ESRD, CHF, liver failure, anemia, hypoalbuminemia, deconditioning, polypharmacy  Patient's presentation is most consistent with acute presentation with potential threat to life or bodily function.  The patient is on the cardiac monitor to evaluate for evidence of arrhythmia and/or significant heart rate changes  82 yo F here with leg swelling, SOB. Suspect acute on chronic CKD w/ progression to ESRD, now failing outpt diuretics. Pt has significant edema on exam, and GFR 6. Was being evaluated for HD before leaving AMA Last week. Will start IV lasix 120 mg IV. Consulted  Dr. Cherylann Ratel and Dr. Wyn Quaker with Vascular Surgery. Will admit to Hospitalist service. UA with pyuria, she does endorse some urgency -will give rocephin. BNP elevated c/w hypervolemia.  CXR shows mild edema.   FINAL CLINICAL IMPRESSION(S) / ED DIAGNOSES   Final diagnoses:  ESRD (end stage renal disease) (HCC)  Other hypervolemia     Rx / DC Orders   ED Discharge Orders     None        Note:  This document was prepared using Dragon voice recognition software and may include unintentional dictation errors.   Shaune Pollack, MD 06/11/23 [phone]

## 2023-06-11 NOTE — Assessment & Plan Note (Signed)
-   Resume home Synthroid 

## 2023-06-11 NOTE — Assessment & Plan Note (Addendum)
Urinalysis notable for pyuria, however patient denies any dysuria. She endorses frequency and urgency, however I suspect this is more likely due to progressing ESRD while receiving high dose diuretics.   -S/p one-time dose of ceftriaxone

## 2023-06-11 NOTE — H&P (Signed)
History and Physical    Patient: Cassandra Baker UEA:[id number] DOB: [birth year] DOA: 06/11/2023 DOS: the patient was seen and examined on 06/11/2023 PCP: Renford Dills, MD  Patient coming from: Home  Chief Complaint:  Chief Complaint  Patient presents with   Leg Swelling   HPI: Cassandra Baker is a 82 y.o. 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  [name] states that since she arrived home, she continued to have significant swelling of her bilateral lower extremities.  In addition, she has severe fatigue and feels very weak all over.  She has been having trouble walking due to this and her daughter had to buy her a walker for the first time.  She denies any shortness of breath, palpitations or chest pain.  She denies any nausea, vomiting or diarrhea.  She has been taking Lasix twice daily since being home without any improvement.  She notes that she is feeling very anxious right now and jittery.  Per chart review, patient was recently admitted from 6/13 to 6/16.  She was admitted for NSTEMI complicated by acute on chronic systolic heart failure, AKI on CKD stage IV.  She completed IV heparin course and stress test demonstrated old fixed defect; LHC was deferred due to CKD.  Plan was to start patient on dialysis but she subsequently left AMA.  ED course: On arrival to the ED, patient was normotensive at 131/67 with heart rate of 85.  She was saturating at 100% on room air.  She was afebrile at 98.4.  Initial workup demonstrated hemoglobin of 8.3, sodium 131, bicarb 21, glucose 146, creatinine 6.45 with GFR of 6, and BNP of 2338. Chest x-ray was obtained that demonstrated pulmonary edema.  Urinalysis with large leukocytes, proteinuria many bacteria.  Patient started on IV Lasix.   Vascular surgery nephrology consulted.  TR[facility] contacted for admission.  Review of Systems: As mentioned in the history of present illness. All other systems reviewed and are negative.  Past Medical History:  Diagnosis Date   Anemia    Arthritis    low back,knees   Asthma    Basal cell carcinoma (BCC)    a. nose   CHF (congestive heart failure) (HCC)    a. prev eval by Dr. Katrinka Blazing in [place] - "many yrs ago."   CKD (chronic kidney disease), stage IV (HCC)    Depression    Diabetes mellitus without complication (HCC)    Dyspnea    Hypertension    Hypothyroidism    Past Surgical History:  Procedure Laterality Date   APPENDECTOMY     4 th grade   AV FISTULA PLACEMENT Left 05/12/2019   Procedure: BRACHIOCEPHALIC ARTERIOVENOUS (AV) FISTULA CREATION LEFT ARM;  Surgeon: Nada Libman, MD;  Location: [facility] OR;  Service: Vascular;  Laterality: Left;   EYE SURGERY  2014   Bilateral 6 mos. apart  cataracts   FISTULA SUPERFICIALIZATION Left 10/06/2019   Procedure: ELEVATION OF ARTERIOVENOUS FISTULA LEFT ARM;  Surgeon: Nada Libman, MD;  Location: [facility] OR;  Service: Vascular;  Laterality: Left;   LYMPH NODE BIOPSY N/A 02/09/2018   Procedure: SENTINEL LYMPH NODE BIOPSY;  Surgeon: Adolphus Birchwood, MD;  Location: [facility] ORS;  Service: Gynecology;  Laterality: N/A;   PARATHYROIDECTOMY N/A 12/07/2014   Procedure: PARATHYROIDECTOMY;  Surgeon:  Darnell Level, MD;  Location: [facility] ORS;  Service: General;  Laterality: N/A;   ROBOTIC ASSISTED TOTAL HYSTERECTOMY WITH BILATERAL SALPINGO OOPHERECTOMY Bilateral 02/09/2018   Procedure: XI ROBOTIC ASSISTED TOTAL HYSTERECTOMY WITH BILATERAL SALPINGO OOPHORECTOMY;  Surgeon: Adolphus Birchwood, MD;  Location: [facility];  Service: Gynecology;  Laterality: Bilateral;   THYROID LOBECTOMY Left 12/07/2014   Procedure: LEFT THYROID LOBECTOMY;  Surgeon: Darnell Level, MD;  Location: [facility];  Service: General;  Laterality: Left;   TONSILLECTOMY     TUBAL LIGATION     Early thirties   Social  History:  reports that she has never smoked. She has never used smokeless tobacco. She reports that she does not drink alcohol and does not use drugs.  Allergies  Allergen Reactions   Plum Pulp Anaphylaxis and Cough   Citrus     Coughing, chest tightness    Egg-Derived Products Itching    congestion   Other Itching    Dairy products/Soaps & detergents   Penicillins Rash    Has patient had a PCN reaction causing immediate rash, facial/tongue/throat swelling, SOB or lightheadedness with hypotension: Yes Has patient had a PCN reaction causing severe rash involving mucus membranes or skin necrosis: No Has patient had a PCN reaction that required hospitalization: No Has patient had a PCN reaction occurring within the last 10 years: No If all of the above answers are "NO", then may proceed with Cephalosporin use.     Family History  Problem Relation Age of Onset   Hypertension Mother        MI's beginning in 30's, died @ 69   Heart disease Mother    Hypertension Father    Heart disease Father    Heart disease Brother    Stroke Brother    Cervical cancer Maternal Aunt    Heart disease Brother    Stroke Brother     Prior to Admission medications   Medication Sig Start Date End Date Taking? Authorizing Provider  acetaminophen (TYLENOL) 500 MG tablet Take 500-1,000 mg by mouth every 6 (six) hours as needed (pain).     [provider]  allopurinol (ZYLOPRIM) 100 MG tablet Take 100 mg by mouth daily as needed.    [provider]  calcitRIOL (ROCALTROL) 0.5 MCG capsule Take 0.5 mcg by mouth daily.  04/01/19   [provider]  calcium acetate (PHOSLO) 667 MG tablet Take 667 mg by mouth 3 (three) times daily.    [provider]  FLUoxetine (PROZAC) 10 MG capsule Take 10 mg by mouth 3 (three) times a week. At night    [provider]  furosemide (LASIX) 40 MG tablet Take 80 mg by mouth 2 (two) times daily.     [provider]   HYDROcodone-acetaminophen (NORCO) 5-325 MG tablet Take 1 tablet by mouth every 6 (six) hours as needed for moderate pain. 10/06/19   Lars Mage, PA-C  hydrOXYzine (ATARAX/VISTARIL) 10 MG tablet Take 10 mg by mouth at bedtime as needed for itching.    [provider]  levothyroxine (SYNTHROID) 75 MCG tablet Take 75 mcg by mouth daily before breakfast.    [provider]  loratadine (CLARITIN) 5 MG chewable tablet Chew 5 mg by mouth daily as needed for allergies.    [provider]  sitaGLIPtin (JANUVIA) 25 MG tablet Take 25 mg by mouth daily.    [provider]  sodium bicarbonate 650 MG tablet Take 650 mg by mouth 2 (two) times daily. 04/26/19  [provider]    Physical Exam: Vitals:   06/11/23 1321 06/11/23 1322  BP:  131/67  Pulse:  85  Resp:  16  Temp:  98.4 F (36.9 C)  TempSrc:  Oral  SpO2:  100%  Weight: 66.1 kg   Height: 5\' 4"  (1.626 m)    Physical Exam Vitals and nursing note reviewed.  Constitutional:      General: She is not in acute distress.    Appearance: She is normal weight.  HENT:     Head: Normocephalic and atraumatic.     Mouth/Throat:     Mouth: Mucous membranes are moist.     Pharynx: Oropharynx is clear.  Eyes:     Conjunctiva/sclera: Conjunctivae normal.     Pupils: Pupils are equal, round, and reactive to light.  Cardiovascular:     Rate and Rhythm: Normal rate and regular rhythm.     Comments: Edema extends beyond the knees Pulmonary:     Effort: Pulmonary effort is normal. No respiratory distress.     Breath sounds: Normal breath sounds. No wheezing, rhonchi or rales.  Abdominal:     General: Bowel sounds are normal.     Palpations: Abdomen is soft.  Musculoskeletal:     Right lower leg: 2+ Pitting Edema present.     Left lower leg: 2+ Pitting Edema present.  Skin:    General: Skin is warm and dry.  Neurological:     Mental Status: She is alert and oriented to person, place, and time.      Comments: No focal weakness; global weakness noted.   Psychiatric:        Attention and Perception: Attention normal.        Mood and Affect: Affect normal. Mood is anxious.        Behavior: Behavior normal. Behavior is cooperative.    Data Reviewed: CBC with WBC of 8.8, hemoglobin 8.3, MCV of 96.7 and platelets of 200 CMP with sodium of 131, potassium 5.1, bicarb 21, glucose 146, BUN 97, creatinine 6.45 with GFR of 6, AST 15, ALT 28 BNP elevated 2338 Urinalysis with glucosuria, leukocytes, proteinuria, many bacteria and over 50 WBC/hpf  EKG personally reviewed.  Sinus rhythm with rate of 83.  No acutely ischemic appearing changes.    DG Chest 2 View  Result Date: 06/11/2023 CLINICAL DATA:  SOB, eval edema, new onset ESRD EXAM: CHEST - 2 VIEW COMPARISON:  June 04, 2023 FINDINGS: The cardiomediastinal silhouette is unchanged in contour.Mild bronchial cuffing with interstitial prominence. No pleural effusion. No pneumothorax. Visualized abdomen is unremarkable. Multilevel degenerative changes of the thoracic spine. IMPRESSION: Constellation of findings are favored to reflect mild underlying pulmonary edema. Electronically Signed   By: Meda Klinefelter M.D.   On: 06/11/2023 16:22    Results are pending, will review when available.  Assessment and Plan:  * ESRD (end stage renal disease) (HCC) Patient is presenting with bilateral lower extremity edema, fatigue, jitteriness, anxiety in the setting of progression of her CKD stage V to ESRD with need for dialysis.  Although there is evidence of uremia, patient has not altered at this time.  Original plan was to start dialysis last week, however patient left AMA.  She is amenable to starting at this time.  - Nephrology and vascular surgery consulted; appreciate their recommendations - Tentative plan for dialysis tomorrow - N.p.o. after midnight  Acute systolic CHF (congestive heart failure) (HCC) Patient is presenting with hypervolemia in the  setting of acute on  chronic HFrEF exacerbated by poor urine output secondary to ESRD.  BNP markedly elevated at 2338, increased from 8 days prior at which time is 2261.  She has had some urine output with Lasix 120 mg IV.  - Telemetry monitoring - Strict in and out - Daily weights - Continue with Lasix IV 40 mg twice daily, at least until dialysis is initiated - Continue home metoprolol  CAD (coronary artery disease) Patient has a history of CAD, recently admitted for NSTEMI.  Stress test demonstrated fixed ischemic defect.  Left heart cath deferred due to renal failure.  Cardiology recommending outpatient follow-up at that time.  - Restart aspirin - Continue home metoprolol  Type 2 diabetes mellitus (HCC) - Hold home Januvia - SSI, very sensitive  Hypothyroidism - Resume home Synthroid  Essential hypertension - Resume home metoprolol  Anxiety and depression - Resume home fluoxetine - As needed BuSpar for anxiety  Pyuria Urinalysis notable for pyuria, however patient denies any dysuria. She endorses frequency and urgency, however I suspect this is more likely due to progressing ESRD while receiving high dose diuretics.   -S/p one-time dose of ceftriaxone  Advance Care Planning:   Code Status: Full Code   Consults: Vascular Surgery, Nephrology  Family Communication: No family at bedside  Severity of Illness: The appropriate patient status for this patient is INPATIENT. Inpatient status is judged to be reasonable and necessary in order to provide the required intensity of service to ensure the patient's safety. The patient's presenting symptoms, physical exam findings, and initial radiographic and laboratory data in the context of their chronic comorbidities is felt to place them at high risk for further clinical deterioration. Furthermore, it is not anticipated that the patient will be medically stable for discharge from the hospital within 2 midnights of admission.   * I  certify that at the point of admission it is my clinical judgment that the patient will require inpatient hospital care spanning beyond 2 midnights from the point of admission due to high intensity of service, high risk for further deterioration and high frequency of surveillance required.*  Author: Verdene Lennert, MD 06/11/2023 6:15 PM  For on call review [email or web address].

## 2023-06-11 NOTE — Assessment & Plan Note (Addendum)
Patient is presenting with bilateral lower extremity edema, fatigue, jitteriness, anxiety in the setting of progression of her CKD stage V to ESRD with need for dialysis.  Although there is evidence of uremia, patient has not altered at this time.  Original plan was to start dialysis last week, however patient left AMA.  She is amenable to starting at this time.  - Nephrology and vascular surgery consulted; appreciate their recommendations - Tentative plan for dialysis tomorrow - N.p.o. after midnight

## 2023-06-12 ENCOUNTER — Encounter: Payer: Self-pay | Admitting: Internal Medicine

## 2023-06-12 ENCOUNTER — Encounter
Admission: EM | Disposition: A | Discharge status: Other Acute Inpatient Hospital | Payer: Self-pay | Source: Home / Self Care | Attending: Internal Medicine

## 2023-06-12 DIAGNOSIS — N186 End stage renal disease: Secondary | ICD-10-CM

## 2023-06-12 DIAGNOSIS — N189 Chronic kidney disease, unspecified: Secondary | ICD-10-CM

## 2023-06-12 HISTORY — PX: TEMPORARY DIALYSIS CATHETER: CATH118312

## 2023-06-12 LAB — GLUCOSE, CAPILLARY
Glucose-Capillary: 116 mg/dL — ABNORMAL HIGH (ref 70–99)
Glucose-Capillary: 143 mg/dL — ABNORMAL HIGH (ref 70–99)
Glucose-Capillary: 158 mg/dL — ABNORMAL HIGH (ref 70–99)
Glucose-Capillary: 122 mg/dL — ABNORMAL HIGH (ref 70–99)
Glucose-Capillary: 116 mg/dL — ABNORMAL HIGH (ref 70–99)

## 2023-06-12 LAB — CBC
HCT: 24.7 % — ABNORMAL LOW (ref 36.0–46.0)
Hemoglobin: 7.8 g/dL — ABNORMAL LOW (ref 12.0–15.0)
MCH: 30.2 pg (ref 26.0–34.0)
MCHC: 31.6 g/dL (ref 30.0–36.0)
MCV: 95.7 fL (ref 80.0–100.0)
Platelets: 179 10*3/uL (ref 150–400)
RBC: 2.58 MIL/uL — ABNORMAL LOW (ref 3.87–5.11)
RDW: 13 % (ref 11.5–15.5)
WBC: 8.1 10*3/uL (ref 4.0–10.5)
nRBC: 0 % (ref 0.0–0.2)

## 2023-06-12 LAB — BASIC METABOLIC PANEL
Anion gap: 13 (ref 5–15)
BUN: 106 mg/dL — ABNORMAL HIGH (ref 8–23)
CO2: 20 mmol/L — ABNORMAL LOW (ref 22–32)
Calcium: 9.3 mg/dL (ref 8.9–10.3)
Chloride: 100 mmol/L (ref 98–111)
Creatinine, Ser: 6.42 mg/dL — ABNORMAL HIGH (ref 0.44–1.00)
GFR, Estimated: 6 mL/min — ABNORMAL LOW (ref >60)
Glucose, Bld: 108 mg/dL — ABNORMAL HIGH (ref 70–99)
Potassium: 4.7 mmol/L (ref 3.5–5.1)
Sodium: 133 mmol/L — ABNORMAL LOW (ref 135–145)

## 2023-06-12 LAB — PROTIME-INR
INR: 1.3 — ABNORMAL HIGH (ref 0.8–1.2)
Prothrombin Time: 16.2 seconds — ABNORMAL HIGH (ref 11.4–15.2)

## 2023-06-12 LAB — HEPATITIS B SURFACE ANTIGEN: Hepatitis B Surface Ag: NONREACTIVE

## 2023-06-12 LAB — URINE CULTURE

## 2023-06-12 SURGERY — TEMPORARY DIALYSIS CATHETER
Anesthesia: LOCAL

## 2023-06-12 MED ORDER — PENTAFLUOROPROP-TETRAFLUOROETH EX AERO: 1.0000 | INHALATION_SPRAY | CUTANEOUS | Status: DC | PRN

## 2023-06-12 MED ORDER — ALTEPLASE 2 MG IJ SOLR: 2.0000 mg | Freq: Once | INTRAMUSCULAR | Status: DC | PRN

## 2023-06-12 MED ORDER — HEPARIN SODIUM (PORCINE) 1000 UNIT/ML IJ SOLN
INTRAMUSCULAR | Status: AC
  Filled 2023-06-12: qty 4

## 2023-06-12 MED ORDER — SODIUM CHLORIDE 0.9 % IV SOLN: INTRAVENOUS | Status: DC

## 2023-06-12 MED ORDER — LIDOCAINE HCL (PF) 1 % IJ SOLN
INTRAMUSCULAR | Status: DC | PRN
  Administered 2023-06-12: 10 mL

## 2023-06-12 MED ORDER — LIDOCAINE HCL (PF) 1 % IJ SOLN: 5.0000 mL | INTRAMUSCULAR | Status: DC | PRN

## 2023-06-12 MED ORDER — CHLORHEXIDINE GLUCONATE CLOTH 2 % EX PADS
6.0000 | MEDICATED_PAD | Freq: Every day | CUTANEOUS | Status: DC
  Administered 2023-06-13: 6 via TOPICAL

## 2023-06-12 MED ORDER — ANTICOAGULANT SODIUM CITRATE 4% (200MG/5ML) IV SOLN: 5.0000 mL | Status: DC | PRN

## 2023-06-12 MED ORDER — HEPARIN SODIUM (PORCINE) 1000 UNIT/ML DIALYSIS: 1000.0000 [IU] | INTRAMUSCULAR | Status: DC | PRN

## 2023-06-12 MED ORDER — LIDOCAINE-PRILOCAINE 2.5-2.5 % EX CREA: 1.0000 | TOPICAL_CREAM | CUTANEOUS | Status: DC | PRN

## 2023-06-12 SURGICAL SUPPLY — 1 items: KIT DIALYSIS CATH TRI 30X13 (CATHETERS) IMPLANT

## 2023-06-12 NOTE — Consult Note (Signed)
Hospital Consult    Reason for Consult:  Temporary Dialysis catheter needed for Hemodialysis Requesting Physician:  Dr Imagene Gurney MD  MRN #:  [id number]  History of Present Illness: This is a 82 y.o. 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 Patient was recently admitted from 6/13 to 6/16 for NSTEMI complicated by acute on chronic systolic heart failure, AKI on CKD stage IV. She completed IV heparin course and stress test demonstrated old fixed defect; LHC was deferred due to CKD. Plan was to start patient on dialysis but she subsequently left AMA.   On exam this morning the patient was resting comfortably in bed. She endorses she will start hemodialysis now because she has found reasons to live where as prior she felt differently. She states she had a fistula creation completed in her left upper arm back in 2020 but it was never used. She admits to delaying dialysis for years. Vascular surgery was consulted to place a temporary dialysis catheter for hemodialysis. She agrees to placement. No other complaints this morning. Vitals all remain stable.   Past Medical History:  Diagnosis Date   Anemia    Arthritis    low back,knees   Asthma    Basal cell carcinoma (BCC)    a. nose   CHF (congestive heart failure) (HCC)    a. prev eval by Dr. Katrinka Blazing in [place] - "many yrs ago."   CKD (chronic kidney disease), stage IV (HCC)    Depression    Diabetes mellitus without complication (HCC)    Dyspnea    Hypertension    Hypothyroidism     Past Surgical History:  Procedure Laterality Date   APPENDECTOMY     4 th grade   AV FISTULA PLACEMENT Left 05/12/2019   Procedure: BRACHIOCEPHALIC ARTERIOVENOUS (AV) FISTULA CREATION LEFT ARM;  Surgeon: Nada Libman, MD;  Location: [facility] OR;   Service: Vascular;  Laterality: Left;   EYE SURGERY  2014   Bilateral 6 mos. apart  cataracts   FISTULA SUPERFICIALIZATION Left 10/06/2019   Procedure: ELEVATION OF ARTERIOVENOUS FISTULA LEFT ARM;  Surgeon: Nada Libman, MD;  Location: [facility] OR;  Service: Vascular;  Laterality: Left;   LYMPH NODE BIOPSY N/A 02/09/2018   Procedure: SENTINEL LYMPH NODE BIOPSY;  Surgeon: Adolphus Birchwood, MD;  Location: [facility];  Service: Gynecology;  Laterality: N/A;   PARATHYROIDECTOMY N/A 12/07/2014   Procedure: PARATHYROIDECTOMY;  Surgeon: Darnell Level, MD;  Location: [facility];  Service: General;  Laterality: N/A;   ROBOTIC ASSISTED TOTAL HYSTERECTOMY WITH BILATERAL SALPINGO OOPHERECTOMY Bilateral 02/09/2018   Procedure: XI ROBOTIC ASSISTED TOTAL HYSTERECTOMY WITH BILATERAL SALPINGO OOPHORECTOMY;  Surgeon: Adolphus Birchwood, MD;  Location: [facility];  Service: Gynecology;  Laterality: Bilateral;   THYROID LOBECTOMY Left 12/07/2014   Procedure: LEFT THYROID LOBECTOMY;  Surgeon: Darnell Level, MD;  Location: [facility];  Service: General;  Laterality: Left;   TONSILLECTOMY     TUBAL LIGATION     Early thirties    Allergies  Allergen Reactions   Plum Pulp Anaphylaxis and Cough   Citrus     Coughing, chest tightness    Egg-Derived Products Itching    congestion   Other Itching    Dairy products/Soaps & detergents   Penicillins Rash    Has patient had a PCN  reaction causing immediate rash, facial/tongue/throat swelling, SOB or lightheadedness with hypotension: Yes Has patient had a PCN reaction causing severe rash involving mucus membranes or skin necrosis: No Has patient had a PCN reaction that required hospitalization: No Has patient had a PCN reaction occurring within the last 10 years: No If all of the above answers are "NO", then may proceed with Cephalosporin use.     Prior to Admission medications   Medication Sig Start Date End Date Taking? Authorizing Provider  acetaminophen (TYLENOL) 500 MG tablet Take 500-1,000  mg by mouth every 6 (six) hours as needed (pain).    Yes [provider]  allopurinol (ZYLOPRIM) 100 MG tablet Take 100 mg by mouth daily as needed.   Yes [provider]  calcitRIOL (ROCALTROL) 0.5 MCG capsule Take 0.5 mcg by mouth daily.  04/01/19  Yes [provider]  calcium acetate (PHOSLO) 667 MG tablet Take 667 mg by mouth 3 (three) times daily.   Yes [provider]  FLUoxetine (PROZAC) 10 MG capsule Take 10 mg by mouth 3 (three) times a week. At night   Yes [provider]  furosemide (LASIX) 40 MG tablet Take 80 mg by mouth 2 (two) times daily.    Yes [provider]  hydrOXYzine (ATARAX/VISTARIL) 10 MG tablet Take 10 mg by mouth at bedtime as needed for itching.   Yes [provider]  levothyroxine (SYNTHROID) 75 MCG tablet Take 75 mcg by mouth daily before breakfast.   Yes [provider]  loratadine (CLARITIN) 5 MG chewable tablet Chew 5 mg by mouth daily as needed for allergies.   Yes [provider]  sitaGLIPtin (JANUVIA) 25 MG tablet Take 25 mg by mouth daily.   Yes [provider]  sodium bicarbonate 650 MG tablet Take 650 mg by mouth 2 (two) times daily. 04/26/19  Yes [provider]    Social History   Socioeconomic History   Marital status: Married    Spouse name: Not on file   Number of children: Not on file   Years of education: Not on file   Highest education level: Not on file  Occupational History   Not on file  Tobacco Use   Smoking status: Never   Smokeless tobacco: Never  Vaping Use   Vaping Use: Never used  Substance and Sexual Activity   Alcohol use: No   Drug use: No   Sexual activity: Not Currently    Birth control/protection: Post-menopausal  Other Topics Concern   Not on file  Social History Narrative   Lives in [place].  Does not routinely exercise.   Social Determinants of Health   Financial Resource Strain: Not on file  Food Insecurity: No Food  Insecurity (06/11/2023)   Hunger Vital Sign    Worried About Running Out of Food in the Last Year: Never true    Ran Out of Food in the Last Year: Never true  Transportation Needs: No Transportation Needs (06/11/2023)   PRAPARE - Administrator, Civil Service (Medical): No    Lack of Transportation (Non-Medical): No  Physical Activity: Not on file  Stress: Not on file  Social Connections: Not on file  Intimate Partner Violence: Unknown (06/11/2023)   Humiliation, Afraid, Rape, and Kick questionnaire    Fear of Current or Ex-Partner: Patient declined    Emotionally Abused: No    Physically Abused: No    Sexually Abused: No     Family History  Problem Relation Age  of Onset   Hypertension Mother        MI's beginning in 24's, died @ 65   Heart disease Mother    Hypertension Father    Heart disease Father    Heart disease Brother    Stroke Brother    Cervical cancer Maternal Aunt    Heart disease Brother    Stroke Brother     ROS: Otherwise negative unless mentioned in HPI  Physical Examination  Vitals:   06/12/23 0742 06/12/23 1142  BP: (!) 119/58 130/64  Pulse: 79   Resp: 16   Temp: 97.8 F (36.6 C) 97.8 F (36.6 C)  SpO2: 100% 100%   Body mass index is 24.75 kg/m.  General:  WDWN in NAD Gait: Not observed HENT: WNL, normocephalic Pulmonary: normal non-labored breathing, without Rales, rhonchi,  wheezing Cardiac: regular, without  Murmurs, rubs or gallops; without carotid bruits Abdomen: Positive bowel sounds,  soft, NT/ND, no masses Skin: without rashes Vascular Exam/Pulses: Bilateral lower extremity edema +2 and unable to palpate pulses. Patients legs are warm to the touch.  Extremities: without ischemic changes, without Gangrene , without cellulitis; without open wounds;  Musculoskeletal: no muscle wasting or atrophy  Neurologic: A&O X 3;  No focal weakness or paresthesias are detected; speech is fluent/normal Psychiatric:  The pt has Normal  affect. Lymph:  Unremarkable  CBC    Component Value Date/Time   WBC 8.1 06/12/2023 0413   RBC 2.58 (L) 06/12/2023 0413   HGB 7.8 (L) 06/12/2023 0413   HGB 12.0 03/03/2014 1004   HCT 24.7 (L) 06/12/2023 0413   HCT 35.9 03/03/2014 1004   PLT 179 06/12/2023 0413   PLT 177 03/03/2014 1004   MCV 95.7 06/12/2023 0413   MCV 84 03/03/2014 1004   MCH 30.2 06/12/2023 0413   MCHC 31.6 06/12/2023 0413   RDW 13.0 06/12/2023 0413   RDW 15.3 (H) 03/03/2014 1004    BMET    Component Value Date/Time   NA 133 (L) 06/12/2023 0413   NA 136 03/03/2014 1004   K 4.7 06/12/2023 0413   K 4.4 03/03/2014 1004   CL 100 06/12/2023 0413   CL 106 03/03/2014 1004   CO2 20 (L) 06/12/2023 0413   CO2 25 03/03/2014 1004   GLUCOSE 108 (H) 06/12/2023 0413   GLUCOSE 212 (H) 03/03/2014 1004   BUN 106 (H) 06/12/2023 0413   BUN 44 (H) 03/03/2014 1004   CREATININE 6.42 (H) 06/12/2023 0413   CREATININE 1.86 (H) 03/03/2014 1004   CALCIUM 9.3 06/12/2023 0413   CALCIUM 9.5 03/03/2014 1004   GFRNONAA 6 (L) 06/12/2023 0413   GFRNONAA 26 (L) 03/03/2014 1004   GFRAA 23 (L) 02/18/2018 1424   GFRAA 31 (L) 03/03/2014 1004    COAGS: Lab Results  Component Value Date   INR 1.3 (H) 06/12/2023   INR 1.2 06/04/2023     Non-Invasive Vascular Imaging:   DIALYSIS ACCESS   Patient Name:  Cassandra Baker  Date of Exam:   04/23/2023  Medical Rec #: [id number]       Accession #:    [id number]  Date of Birth: 1941-01-02       Patient Gender: F  Patient Age:   65 years  Exam Location:  [facility]  Procedure:      VAS US DUPLEX DIALYSIS ACCESS (AVF, AVG)  Referring Phys: Lelon Mast RHYNE    ---------------------------------------------------------------------------  -----    Reason for Exam: Loss of thrill.   Access  Site: Left Upper Extremity.   Access Type: Brachial-cephalic AVF.   History: Created 05/12/19 with superficialization on 10/06/19.   Performing Technologist: Thereasa Parkin RVT      Examination Guidelines: A complete evaluation includes B-mode imaging,  spectral  Doppler, color Doppler, and power Doppler as needed of all accessible  portions  of each vessel. Unilateral testing is considered an integral part of a  complete  examination. Limited examinations for reoccurring indications may be  performed  as noted.     Findings:  +--------------------+----------+-----------------+---------+  AVF                PSV (cm/s)Flow Vol (mL/min)Comments   +--------------------+----------+-----------------+---------+  Native artery inflow    87                     pulsatile  +--------------------+----------+-----------------+---------+  AVF Anastomosis         50                                +--------------------+----------+-----------------+---------+     +------------+----------+-------------+----------+-------------+  OUTFLOW VEINPSV (cm/s)Diameter (cm)Depth (cm)  Describe     +------------+----------+-------------+----------+-------------+  Prox UA         9         0.30        0.48                  +------------+----------+-------------+----------+-------------+  Mid UA         357        0.31        0.29      patent      +------------+----------+-------------+----------+-------------+  Dist UA         7         0.42        0.20   Sub occlusion  +------------+----------+-------------+----------+-------------+  AC Fossa       287                                          +------------+----------+-------------+----------+-------------+    Summary:  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 Patent in the upper arm.  Outflow vein diameters < 4 cm.   EXAM:06/05/2023 RENAL / URINARY TRACT ULTRASOUND COMPLETE   COMPARISON:  None Available.   FINDINGS: Right Kidney:   Length = 7.7 cm   Renal cortical thinning. Diffusely increased cortical echogenicity with preserved  corticomedullary differentiation which can be seen with medical renal disease. No urinary tract dilation or shadowing calculi. The ureter is not seen.   Left Kidney:   Length = 8.3 cm   Renal cortical thinning. Diffusely increased cortical echogenicity with diminished corticomedullary differentiation which can be seen with medical renal disease. Interpolar simple-appearing cyst measures 10 mm. No urinary tract dilation or shadowing calculi. The ureter is not seen.   Bladder:   Appears normal for degree of bladder distention.   Other:   None.   IMPRESSION: 1. No urinary tract dilation or shadowing calculi. 2. Bilateral renal cortical thinning and increased cortical echogenicity which can be seen with medical renal disease.  Statin:  No. Beta Blocker:  No. Aspirin:  No. ACEI:  No. ARB:  No. CCB use:  No Other antiplatelets/anticoagulants:  No.    ASSESSMENT/PLAN: This is  a 82 y.o. female who presents to [facility] with CKD stage 4 now progressed to ESRD and in need of a temporary dialysis catheter. Patients BUN this morning was 106 therefore temp cath VS [facility] was selected.   I discussed in detail with the patient the procedure, benefits, risks and complications. She verbalized her understanding and wants to proceed. We plan on placing the temp dialysis catheter today 06/12/2023. Patient does not need to be NPO   I discussed the plan in detail with Dr Vilinda Flake MD and he is in agreement.    [facility] 06/12/2023 11:47 AM

## 2023-06-12 NOTE — Progress Notes (Addendum)
Progress Note    Cassandra Baker  ZOX:[id number] DOB: Oct 15, 1941  DOA: 06/11/2023 PCP: Renford Dills, MD      Brief Narrative:    Medical records reviewed and are as summarized below:  Cassandra Baker is a 82 y.o. 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  She was recently admitted from 06/04/2023 through 06/07/2023 for NSTEMI complicated by acute on chronic systolic CHF, AKI on CKD stage IV.  At that time, she was treated with IV heparin drip and stress test showed old fixed defect.  Left heart catheter was deferred because of CKD.  Plan was to start patient on hemodialysis but she left AGAINST MEDICAL ADVICE.  She presented to the hospital again on 06/11/2023  bilateral leg swelling, fatigue, general weakness and shortness of breath.  She said she had been taking Lasix twice a day at home but her symptoms did not get any better.  She felt anxious and jittery.  She presented to the ED for further management.      Assessment/Plan:   Principal Problem:   ESRD (end stage renal disease) (HCC) Active Problems:   Acute systolic CHF (congestive heart failure) (HCC)   CAD (coronary artery disease)   Type 2 diabetes mellitus (HCC)   Hypothyroidism   Essential hypertension   Anxiety and depression   Pyuria    Body mass index is 24.75 kg/m.    ESRD (end stage renal disease) (HCC) Progression of CKD stage IV due to ESRD suspected.  Right femoral temporary dialysis catheter inserted today by vascular surgeon. Plan to initiate hemodialysis.  Follow-up with nephrologist. Original plan was to start hemodialysis on previous admission but he had left the hospital AMA.    Acute on chronic systolic CHF (congestive heart failure) (HCC) S/p treatment with IV Lasix.  Plan for hemodialysis today. Monitor daily weight,  urine output.    CAD (coronary artery disease) Recently admitted for NSTEMI.  Stress test demonstrated fixed ischemic defect.  Left heart cath deferred due to renal failure.  Cardiologist, and outpatient follow-up appointment time.   Continue aspirin and metoprolol   Type 2 diabetes mellitus (HCC) NovoLog as needed for hyperglycemia.  Januvia on hold.    Pyuria She reported some frequency and urgency.   Follow-up urine culture. -S/p one-time dose of ceftriaxone on 06/11/2023    Other comorbidities include anemia of chronic disease, hypothyroidism, hypertension, anxiety, depression    Diet Order             Diet NPO time specified  Diet effective midnight           Diet NPO time specified  Diet effective midnight                            Consultants: Nephrologist Vascular surgeon  Procedures: Placement of right femoral temporary dialysis catheter on 06/12/2023    Medications:    aspirin EC  81 mg Oral Daily   atorvastatin  80 mg Oral Daily   FLUoxetine  10 mg Oral Once per day on Mon Wed Fri   furosemide  40 mg Intravenous BID   insulin aspart  0-6 Units Subcutaneous TID WC   levothyroxine  75 mcg Oral QAC breakfast   metoprolol succinate  12.5 mg Oral Daily  multivitamin with minerals  1 tablet Oral Daily   sodium bicarbonate  650 mg Oral BID   Continuous Infusions:  sodium chloride       Anti-infectives (From admission, onward)    Start     Dose/Rate Route Frequency Ordered Stop   06/11/23 1630  cefTRIAXone (ROCEPHIN) 2 g in sodium chloride 0.9 % 100 mL IVPB        2 g 200 mL/hr over 30 Minutes Intravenous  Once 06/11/23 1624 06/11/23 1737              Family Communication/Anticipated D/C date and plan/Code Status   DVT prophylaxis:      Code Status: Full Code  Family Communication: None Disposition Plan: Plan to discharge home in 4 to 5 days   Status is: Inpatient Remains inpatient appropriate because: Requires  hemodialysis for ESRD       Subjective:   Interval events noted.  She is anxious about starting hemodialysis.  Breathing is a little better.  She still has some swelling in the legs.  Objective:    Vitals:   06/11/23 2137 06/12/23 0414 06/12/23 0704 06/12/23 0742  BP: (!) 147/74 (!) 112/55  (!) 119/58  Pulse: 88 81  79  Resp: 16 15  16   Temp: 98.3 F (36.8 C) 98.2 F (36.8 C)  97.8 F (36.6 C)  TempSrc: Oral     SpO2: 92% 99%  100%  Weight:   65.4 kg   Height:       No data found.  No intake or output data in the 24 hours ending 06/12/23 1048 Filed Weights   06/11/23 1321 06/12/23 0704  Weight: 66.1 kg 65.4 kg    Exam:  GEN: NAD SKIN: Warm and dry EYES: No pallor or icterus ENT: MMM CV: RRR PULM: CTA B ABD: soft, ND, NT, +BS CNS: AAO x 3, non focal EXT: Bilateral leg and pedal edema, no erythema or tenderness        Data Reviewed:   I have personally reviewed following labs and imaging studies:  Labs: Labs show the following:   Basic Metabolic Panel: Recent Labs  Lab 06/06/23 0436 06/07/23 0458 06/11/23 1322 06/12/23 0413  NA 139 136 131* 133*  K 4.7 5.0 5.1 4.7  CL 104 101 95* 100  CO2 20* 18* 21* 20*  GLUCOSE 142* 126* 146* 108*  BUN 91* 95* 97* 106*  CREATININE 6.26* 6.48* 6.45* 6.42*  CALCIUM 9.5 9.7 9.7 9.3   GFR Estimated Creatinine Clearance: 5.8 mL/min (A) (by C-G formula based on SCr of 6.42 mg/dL (H)). Liver Function Tests: Recent Labs  Lab 06/11/23 1322  AST 15  ALT 28  ALKPHOS 106  BILITOT 0.6  PROT 6.6  ALBUMIN 3.7   No results for input(s): "LIPASE", "AMYLASE" in the last 168 hours. No results for input(s): "AMMONIA" in the last 168 hours. Coagulation profile Recent Labs  Lab 06/12/23 0413  INR 1.3*    CBC: Recent Labs  Lab 06/06/23 0436 06/07/23 0458 06/11/23 1322 06/12/23 0413  WBC 6.0 6.5 8.8 8.1  HGB 7.6* 8.2* 8.3* 7.8*  HCT 24.8* 26.6* 26.7* 24.7*  MCV 95.8 97.1 96.7 95.7  PLT 192 212 200  179   Cardiac Enzymes: No results for input(s): "CKTOTAL", "CKMB", "CKMBINDEX", "TROPONINI" in the last 168 hours. BNP (last 3 results) No results for input(s): "PROBNP" in the last 8760 hours. CBG: Recent Labs  Lab 06/07/23 1342 06/07/23 1620 06/11/23 1746 06/11/23 2318 06/12/23 0743  GLUCAP  110* 126* 113* 122* 116*   D-Dimer: No results for input(s): "DDIMER" in the last 72 hours. Hgb A1c: No results for input(s): "HGBA1C" in the last 72 hours. Lipid Profile: No results for input(s): "CHOL", "HDL", "LDLCALC", "TRIG", "CHOLHDL", "LDLDIRECT" in the last 72 hours. Thyroid function studies: No results for input(s): "TSH", "T4TOTAL", "T3FREE", "THYROIDAB" in the last 72 hours.  Invalid input(s): "FREET3" Anemia work up: No results for input(s): "VITAMINB12", "FOLATE", "FERRITIN", "TIBC", "IRON", "RETICCTPCT" in the last 72 hours. Sepsis Labs: Recent Labs  Lab 06/06/23 0436 06/07/23 0458 06/11/23 1322 06/12/23 0413  WBC 6.0 6.5 8.8 8.1    Microbiology No results found for this or any previous visit (from the past 240 hour(s)).  Procedures and diagnostic studies:  DG Chest 2 View  Result Date: 06/11/2023 CLINICAL DATA:  SOB, eval edema, new onset ESRD EXAM: CHEST - 2 VIEW COMPARISON:  June 04, 2023 FINDINGS: The cardiomediastinal silhouette is unchanged in contour.Mild bronchial cuffing with interstitial prominence. No pleural effusion. No pneumothorax. Visualized abdomen is unremarkable. Multilevel degenerative changes of the thoracic spine. IMPRESSION: Constellation of findings are favored to reflect mild underlying pulmonary edema. Electronically Signed   By: Meda Klinefelter M.D.   On: 06/11/2023 16:22               LOS: 1 day   Keaghan Staton  [facility]   Pager on [email or web address]. If 7PM-7AM, please contact night-coverage at [email or web address]     06/12/2023, 10:48 AM

## 2023-06-12 NOTE — H&P (View-Only) (Signed)
Hospital Consult    Reason for Consult:  Temporary Dialysis catheter needed for Hemodialysis Requesting Physician:  [name]  MRN #:  [id number]  History of Present Illness: This is a 82 y.o. 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 Patient was recently admitted from 6/13 to 6/16 for NSTEMI complicated by acute on chronic systolic heart failure, AKI on CKD stage IV. She completed IV heparin course and stress test demonstrated old fixed defect; LHC was deferred due to CKD. Plan was to start patient on dialysis but she subsequently left AMA.   On exam this morning the patient was resting comfortably in bed. She endorses she will start hemodialysis now because she has found reasons to live where as prior she felt differently. She states she had a fistula creation completed in her left upper arm back in 2020 but it was never used. She admits to delaying dialysis for years. Vascular surgery was consulted to place a temporary dialysis catheter for hemodialysis. She agrees to placement. No other complaints this morning. Vitals all remain stable.   Past Medical History:  Diagnosis Date   Anemia    Arthritis    low back,knees   Asthma    Basal cell carcinoma (BCC)    a. nose   CHF (congestive heart failure) (HCC)    a. prev eval by Dr. Smith in [place] - "many yrs ago."   CKD (chronic kidney disease), stage IV (HCC)    Depression    Diabetes mellitus without complication (HCC)    Dyspnea    Hypertension    Hypothyroidism     Past Surgical History:  Procedure Laterality Date   APPENDECTOMY     4 th grade   AV FISTULA PLACEMENT Left 05/12/2019   Procedure: BRACHIOCEPHALIC ARTERIOVENOUS (AV) FISTULA CREATION LEFT ARM;  Surgeon: Brabham, Vance W, MD;  Location: [facility];   Service: Vascular;  Laterality: Left;   EYE SURGERY  2014   Bilateral 6 mos. apart  cataracts   FISTULA SUPERFICIALIZATION Left 10/06/2019   Procedure: ELEVATION OF ARTERIOVENOUS FISTULA LEFT ARM;  Surgeon: Brabham, Vance W, MD;  Location: [facility];  Service: Vascular;  Laterality: Left;   LYMPH NODE BIOPSY N/A 02/09/2018   Procedure: SENTINEL LYMPH NODE BIOPSY;  Surgeon: Rossi, Emma, MD;  Location: [facility];  Service: Gynecology;  Laterality: N/A;   PARATHYROIDECTOMY N/A 12/07/2014   Procedure: PARATHYROIDECTOMY;  Surgeon: Todd Gerkin, MD;  Location: [facility];  Service: General;  Laterality: N/A;   ROBOTIC ASSISTED TOTAL HYSTERECTOMY WITH BILATERAL SALPINGO OOPHERECTOMY Bilateral 02/09/2018   Procedure: XI ROBOTIC ASSISTED TOTAL HYSTERECTOMY WITH BILATERAL SALPINGO OOPHORECTOMY;  Surgeon: Rossi, Emma, MD;  Location: [facility];  Service: Gynecology;  Laterality: Bilateral;   THYROID LOBECTOMY Left 12/07/2014   Procedure: LEFT THYROID LOBECTOMY;  Surgeon: Todd Gerkin, MD;  Location: [facility];  Service: General;  Laterality: Left;   TONSILLECTOMY     TUBAL LIGATION     Early thirties    Allergies  Allergen Reactions   Plum Pulp Anaphylaxis and Cough   Citrus     Coughing, chest tightness    Egg-Derived Products Itching    congestion   Other Itching    Dairy products/Soaps & detergents   Penicillins Rash    Has patient had a PCN   reaction causing immediate rash, facial/tongue/throat swelling, SOB or lightheadedness with hypotension: Yes Has patient had a PCN reaction causing severe rash involving mucus membranes or skin necrosis: No Has patient had a PCN reaction that required hospitalization: No Has patient had a PCN reaction occurring within the last 10 years: No If all of the above answers are "NO", then may proceed with Cephalosporin use.     Prior to Admission medications   Medication Sig Start Date End Date Taking? Authorizing Provider  acetaminophen (TYLENOL) 500 MG tablet Take 500-1,000  mg by mouth every 6 (six) hours as needed (pain).    Yes [provider]  allopurinol (ZYLOPRIM) 100 MG tablet Take 100 mg by mouth daily as needed.   Yes [provider]  calcitRIOL (ROCALTROL) 0.5 MCG capsule Take 0.5 mcg by mouth daily.  04/01/19  Yes [provider]  calcium acetate (PHOSLO) 667 MG tablet Take 667 mg by mouth 3 (three) times daily.   Yes [provider]  FLUoxetine (PROZAC) 10 MG capsule Take 10 mg by mouth 3 (three) times a week. At night   Yes [provider]  furosemide (LASIX) 40 MG tablet Take 80 mg by mouth 2 (two) times daily.    Yes [provider]  hydrOXYzine (ATARAX/VISTARIL) 10 MG tablet Take 10 mg by mouth at bedtime as needed for itching.   Yes [provider]  levothyroxine (SYNTHROID) 75 MCG tablet Take 75 mcg by mouth daily before breakfast.   Yes [provider]  loratadine (CLARITIN) 5 MG chewable tablet Chew 5 mg by mouth daily as needed for allergies.   Yes [provider]  sitaGLIPtin (JANUVIA) 25 MG tablet Take 25 mg by mouth daily.   Yes [provider]  sodium bicarbonate 650 MG tablet Take 650 mg by mouth 2 (two) times daily. 04/26/19  Yes [provider]    Social History   Socioeconomic History   Marital status: Married    Spouse name: Not on file   Number of children: Not on file   Years of education: Not on file   Highest education level: Not on file  Occupational History   Not on file  Tobacco Use   Smoking status: Never   Smokeless tobacco: Never  Vaping Use   Vaping Use: Never used  Substance and Sexual Activity   Alcohol use: No   Drug use: No   Sexual activity: Not Currently    Birth control/protection: Post-menopausal  Other Topics Concern   Not on file  Social History Narrative   Lives in [place].  Does not routinely exercise.   Social Determinants of Health   Financial Resource Strain: Not on file  Food Insecurity: No Food  Insecurity (06/11/2023)   Hunger Vital Sign    Worried About Running Out of Food in the Last Year: Never true    Ran Out of Food in the Last Year: Never true  Transportation Needs: No Transportation Needs (06/11/2023)   PRAPARE - Transportation    Lack of Transportation (Medical): No    Lack of Transportation (Non-Medical): No  Physical Activity: Not on file  Stress: Not on file  Social Connections: Not on file  Intimate Partner Violence: Unknown (06/11/2023)   Humiliation, Afraid, Rape, and Kick questionnaire    Fear of Current or Ex-Partner: Patient declined    Emotionally Abused: No    Physically Abused: No    Sexually Abused: No     Family History  Problem Relation Age   of Onset   Hypertension Mother        MI's beginning in 60's, died @ 78   Heart disease Mother    Hypertension Father    Heart disease Father    Heart disease Brother    Stroke Brother    Cervical cancer Maternal Aunt    Heart disease Brother    Stroke Brother     ROS: Otherwise negative unless mentioned in HPI  Physical Examination  Vitals:   06/12/23 0742 06/12/23 1142  BP: (!) 119/58 130/64  Pulse: 79   Resp: 16   Temp: 97.8 F (36.6 C) 97.8 F (36.6 C)  SpO2: 100% 100%   Body mass index is 24.75 kg/m.  General:  WDWN in NAD Gait: Not observed HENT: WNL, normocephalic Pulmonary: normal non-labored breathing, without Rales, rhonchi,  wheezing Cardiac: regular, without  Murmurs, rubs or gallops; without carotid bruits Abdomen: Positive bowel sounds,  soft, NT/ND, no masses Skin: without rashes Vascular Exam/Pulses: Bilateral lower extremity edema +2 and unable to palpate pulses. Patients legs are warm to the touch.  Extremities: without ischemic changes, without Gangrene , without cellulitis; without open wounds;  Musculoskeletal: no muscle wasting or atrophy  Neurologic: A&O X 3;  No focal weakness or paresthesias are detected; speech is fluent/normal Psychiatric:  The pt has Normal  affect. Lymph:  Unremarkable  CBC    Component Value Date/Time   WBC 8.1 06/12/2023 0413   RBC 2.58 (L) 06/12/2023 0413   HGB 7.8 (L) 06/12/2023 0413   HGB 12.0 03/03/2014 1004   HCT 24.7 (L) 06/12/2023 0413   HCT 35.9 03/03/2014 1004   PLT 179 06/12/2023 0413   PLT 177 03/03/2014 1004   MCV 95.7 06/12/2023 0413   MCV 84 03/03/2014 1004   MCH 30.2 06/12/2023 0413   MCHC 31.6 06/12/2023 0413   RDW 13.0 06/12/2023 0413   RDW 15.3 (H) 03/03/2014 1004    BMET    Component Value Date/Time   NA 133 (L) 06/12/2023 0413   NA 136 03/03/2014 1004   K 4.7 06/12/2023 0413   K 4.4 03/03/2014 1004   CL 100 06/12/2023 0413   CL 106 03/03/2014 1004   CO2 20 (L) 06/12/2023 0413   CO2 25 03/03/2014 1004   GLUCOSE 108 (H) 06/12/2023 0413   GLUCOSE 212 (H) 03/03/2014 1004   BUN 106 (H) 06/12/2023 0413   BUN 44 (H) 03/03/2014 1004   CREATININE 6.42 (H) 06/12/2023 0413   CREATININE 1.86 (H) 03/03/2014 1004   CALCIUM 9.3 06/12/2023 0413   CALCIUM 9.5 03/03/2014 1004   GFRNONAA 6 (L) 06/12/2023 0413   GFRNONAA 26 (L) 03/03/2014 1004   GFRAA 23 (L) 02/18/2018 1424   GFRAA 31 (L) 03/03/2014 1004    COAGS: Lab Results  Component Value Date   INR 1.3 (H) 06/12/2023   INR 1.2 06/04/2023     Non-Invasive Vascular Imaging:   DIALYSIS ACCESS   Patient Name:  Cassandra Baker  Date of Exam:   04/23/2023  Medical Rec #: [id number]       Accession #:    [id number]  Date of Birth: 09/01/1941       Patient Gender: F  Patient Age:   81 years  Exam Location:  [facility]  Procedure:      VAS US DUPLEX DIALYSIS ACCESS (AVF, AVG)  Referring Phys: SAMANTHA RHYNE    ---------------------------------------------------------------------------  -----    Reason for Exam: Loss of thrill.   Access   Site: Left Upper Extremity.   Access Type: Brachial-cephalic AVF.   History: Created 05/12/19 with superficialization on 10/06/19.   Performing Technologist: Helene Cestone RVT      Examination Guidelines: A complete evaluation includes B-mode imaging,  spectral  Doppler, color Doppler, and power Doppler as needed of all accessible  portions  of each vessel. Unilateral testing is considered an integral part of a  complete  examination. Limited examinations for reoccurring indications may be  performed  as noted.     Findings:  +--------------------+----------+-----------------+---------+  AVF                PSV (cm/s)Flow Vol (mL/min)Comments   +--------------------+----------+-----------------+---------+  Native artery inflow    87                     pulsatile  +--------------------+----------+-----------------+---------+  AVF Anastomosis         50                                +--------------------+----------+-----------------+---------+     +------------+----------+-------------+----------+-------------+  OUTFLOW VEINPSV (cm/s)Diameter (cm)Depth (cm)  Describe     +------------+----------+-------------+----------+-------------+  Prox UA         9         0.30        0.48                  +------------+----------+-------------+----------+-------------+  Mid UA         357        0.31        0.29      patent      +------------+----------+-------------+----------+-------------+  Dist UA         7         0.42        0.20   Sub occlusion  +------------+----------+-------------+----------+-------------+  AC Fossa       287                                          +------------+----------+-------------+----------+-------------+    Summary:  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 Patent in the upper arm.  Outflow vein diameters < 4 cm.   EXAM:06/05/2023 RENAL / URINARY TRACT ULTRASOUND COMPLETE   COMPARISON:  None Available.   FINDINGS: Right Kidney:   Length = 7.7 cm   Renal cortical thinning. Diffusely increased cortical echogenicity with preserved  corticomedullary differentiation which can be seen with medical renal disease. No urinary tract dilation or shadowing calculi. The ureter is not seen.   Left Kidney:   Length = 8.3 cm   Renal cortical thinning. Diffusely increased cortical echogenicity with diminished corticomedullary differentiation which can be seen with medical renal disease. Interpolar simple-appearing cyst measures 10 mm. No urinary tract dilation or shadowing calculi. The ureter is not seen.   Bladder:   Appears normal for degree of bladder distention.   Other:   None.   IMPRESSION: 1. No urinary tract dilation or shadowing calculi. 2. Bilateral renal cortical thinning and increased cortical echogenicity which can be seen with medical renal disease.  Statin:  No. Beta Blocker:  No. Aspirin:  No. ACEI:  No. ARB:  No. CCB use:  No Other antiplatelets/anticoagulants:  No.    ASSESSMENT/PLAN: This is   a 82 y.o. female who presents to [facility] with CKD stage 4 now progressed to ESRD and in need of a temporary dialysis catheter. Patients BUN this morning was 106 therefore temp cath VS Perm Cath was selected.   I discussed in detail with the patient the procedure, benefits, risks and complications. She verbalized her understanding and wants to proceed. We plan on placing the temp dialysis catheter today 06/12/2023. Patient does not need to be NPO   I discussed the plan in detail with Dr Greg Schnier MD and he is in agreement.    Kalen Neidert R [facility] 06/12/2023 11:47 AM  

## 2023-06-12 NOTE — TOC Progression Note (Signed)
Transition of Care [facility]) - Progression Note    Patient Details  Name: ROSALINE EZEKIEL MRN: [id number] Date of Birth: 1941/04/21  Transition of Care [facility]) CM/SW Contact  Kreg Shropshire, RN Phone Number: 06/12/2023, 10:11 AM  Clinical Narrative:    1000- cm spoke with pt and assessed readmission list.  Living Situation: Pt lives alone with son who lives nearby. Has family that can provide support if needed Transportation: Family can provide transportation if needed DME: has a walker at home PCP and Meds: Dr. Renford Dills and Pharmacy at [facility].    Expected Discharge Plan: Skilled Nursing Facility    Expected Discharge Plan and Services       Living arrangements for the past 2 months: Single Family Home                                       Social Determinants of Health (SDOH) Interventions SDOH Screenings   Food Insecurity: No Food Insecurity (06/11/2023)  Housing: Patient Declined (06/11/2023)  Transportation Needs: No Transportation Needs (06/11/2023)  Utilities: Not At Risk (06/11/2023)  Tobacco Use: Low Risk  (06/12/2023)    Readmission Risk Interventions    06/12/2023   10:02 AM 06/06/2023   12:08 PM  Readmission Risk Prevention Plan  Transportation Screening Complete Complete  PCP or Specialist Appt within 5-7 Days  Complete  PCP or Specialist Appt within 3-5 Days Complete   Home Care Screening  Complete  Medication Review (RN CM)  Referral to Pharmacy  HRI or Home Care Consult Complete   Social Work Consult for Recovery Care Planning/Counseling Complete   Palliative Care Screening Not Applicable   Medication Review Oceanographer) Complete

## 2023-06-12 NOTE — Progress Notes (Signed)
Hemodialysis note  Received patient in bed to unit. Alert and oriented.  Informed consent signed and in chart.  Treatment initiated: 1424 Treatment completed: 1627  Patient tolerated well. Transported back to room, alert without acute distress.  Report given to patient's RN.   Access used: Right Femoral HD TempCath Access issues: none  Total UF removed: 0 Medication(s) given:  none  Post HD weight: 63.8 kg   [facility]

## 2023-06-12 NOTE — Progress Notes (Signed)
[facility]  PROGRESS NOTE   Subjective:   INDIYAH Baker  is a 82 y.o. with past medical history  of osteoarthritis, asthma, CHF, advanced CKD, depression, diabetes, hypertension, and hypothyroidism. Patient presents to the ED for lower extremity edema and has been admitted for ESRD (end stage renal disease) (HCC) [N18.6] Other hypervolemia [E87.79]  Patient was recently admitted for the same concerns and left AMA. It was mentioned during that admission that the patient would need to start dialysis and she was agreeable at that time. Patient states she continues to have leg swelling and weakness. Denies nausea or vomiting. No known fever or chills.   Labs on ED arrival significant for sodium 131, serum bicarb 21, glucose 146, BUN 97, creatinine 6.45, BNP greater than 2300, and hemoglobin 8.3.  UA appears hazy with some proteinuria and leukocytes.  Urine culture pending.  Chest x-ray shows mild pulmonary edema.  We have been consulted to initiate renal replacement therapy.   Objective:  Vital signs: Blood pressure (!) 126/59, pulse 76, temperature 98.4 F (36.9 C), temperature source Axillary, resp. rate (!) 28, height 5\' 4"  (1.626 m), weight 63.8 kg, SpO2 100 %.  Intake/Output Summary (Last 24 hours) at 06/12/2023 1517 Last data filed at 06/12/2023 1251 Gross per 24 hour  Intake 240 ml  Output --  Net 240 ml    Filed Weights   06/11/23 1321 06/12/23 0704 06/12/23 1423  Weight: 66.1 kg 65.4 kg 63.8 kg     Physical Exam: General:  No acute distress  Head:  Normocephalic, atraumatic. Moist oral mucosal membranes  Eyes:  Anicteric  Lungs:   Clear to auscultation, normal effort  Heart:  S1S2 no rubs  Abdomen:   Soft, nontender, bowel sounds present  Extremities:  1+ peripheral edema.  Neurologic:  Awake, alert, following commands  Skin:  No lesions  Access:  None    Basic Metabolic Panel: Recent Labs  Lab 06/06/23 0436 06/07/23 0458 06/11/23 1322  06/12/23 0413  NA 139 136 131* 133*  K 4.7 5.0 5.1 4.7  CL 104 101 95* 100  CO2 20* 18* 21* 20*  GLUCOSE 142* 126* 146* 108*  BUN 91* 95* 97* 106*  CREATININE 6.26* 6.48* 6.45* 6.42*  CALCIUM 9.5 9.7 9.7 9.3    GFR: Estimated Creatinine Clearance: 5.8 mL/min (A) (by C-G formula based on SCr of 6.42 mg/dL (H)).  Liver Function Tests: Recent Labs  Lab 06/11/23 1322  AST 15  ALT 28  ALKPHOS 106  BILITOT 0.6  PROT 6.6  ALBUMIN 3.7   No results for input(s): "LIPASE", "AMYLASE" in the last 168 hours. No results for input(s): "AMMONIA" in the last 168 hours.  CBC: Recent Labs  Lab 06/06/23 0436 06/07/23 0458 06/11/23 1322 06/12/23 0413  WBC 6.0 6.5 8.8 8.1  HGB 7.6* 8.2* 8.3* 7.8*  HCT 24.8* 26.6* 26.7* 24.7*  MCV 95.8 97.1 96.7 95.7  PLT 192 212 200 179      HbA1C: Hgb A1c MFr Bld  Date/Time Value Ref Range Status  06/04/2023 04:08 AM 6.4 (H) 4.8 - 5.6 % Final    Comment:    (NOTE) Pre diabetes:          5.7%-6.4%  Diabetes:              >6.4%  Glycemic control for   <7.0% adults with diabetes   02/04/2018 11:04 AM 6.9 (H) 4.8 - 5.6 % Final    Comment:    (NOTE) Pre diabetes:  5.7%-6.4% Diabetes:              >6.4% Glycemic control for   <7.0% adults with diabetes     Urinalysis: Recent Labs    06/11/23 1322  COLORURINE STRAW*  LABSPEC 1.006  PHURINE 7.0  GLUCOSEU 50*  HGBUR NEGATIVE  BILIRUBINUR NEGATIVE  KETONESUR NEGATIVE  PROTEINUR 100*  NITRITE NEGATIVE  LEUKOCYTESUR LARGE*      Imaging: PERIPHERAL VASCULAR CATHETERIZATION  Result Date: 06/12/2023 See surgical note for result.  DG Chest 2 View  Result Date: 06/11/2023 CLINICAL DATA:  SOB, eval edema, new onset ESRD EXAM: CHEST - 2 VIEW COMPARISON:  June 04, 2023 FINDINGS: The cardiomediastinal silhouette is unchanged in contour.Mild bronchial cuffing with interstitial prominence. No pleural effusion. No pneumothorax. Visualized abdomen is unremarkable. Multilevel  degenerative changes of the thoracic spine. IMPRESSION: Constellation of findings are favored to reflect mild underlying pulmonary edema. Electronically Signed   By: Meda Klinefelter M.D.   On: 06/11/2023 16:22     Medications:    anticoagulant sodium citrate      aspirin EC  81 mg Oral Daily   atorvastatin  80 mg Oral Daily   [START ON 06/13/2023] Chlorhexidine Gluconate Cloth  6 each Topical Q0600   FLUoxetine  10 mg Oral Once per day on Mon Wed Fri   furosemide  40 mg Intravenous BID   insulin aspart  0-6 Units Subcutaneous TID WC   levothyroxine  75 mcg Oral QAC breakfast   metoprolol succinate  12.5 mg Oral Daily   multivitamin with minerals  1 tablet Oral Daily   sodium bicarbonate  650 mg Oral BID    Assessment/ Plan:     Cassandra Baker is a 82 y.o. female with medical problems of osteoarthritis, asthma, CHF, advanced CKD, depression, diabetes, hypertension, hypothyroidism was admitted on 06/04/2023 with history of leg swelling and also shortness of breath.   #1: End stage renal disease requiring dialysis.  Patient was admitted with volume overload.  This appears to be a progression of kidney disease and will term this end stage renal disease. Patient is agreeable to dialysis. Will consult vascular surgery for temp cath placement and will provide first treatment at that time. Next treatment scheduled for Saturday.  Renal navigator aware of patient and will seek outpatient dialysis clinic.    #2: Congestive heart failure:ECHO form 06/04/23 shows EF 30-35% with mild global hypokinesis and a grade 1 diastolic dysfunction.    #3: Anemia with chronic kidney disease. Hgb 7.8, below desired range. Will consider EPO.   #4: Acute metabolic acidosis: Prescribe sodium bicarbonate tablets outpatient.  Will correct with dialysis  #5 Secondary Hyperparathyroidism: with outpatient labs: None:  Lab Results  Component Value Date   CALCIUM 9.3 06/12/2023   CAION 1.49 (H) 10/06/2019     Patient was prescribed calcitriol and calcium acetate outpatient.  #6: Hypertension: Will continue the metoprolol along with furosemide at the present doses.     LOS: 1 [facility] 6/21/20243:17 PM

## 2023-06-12 NOTE — Interval H&P Note (Signed)
History and Physical Interval Note:  06/12/2023 12:41 PM  Cassandra Baker  has presented today for surgery, with the diagnosis of ESRD.  The various methods of treatment have been discussed with the patient and family. After consideration of risks, benefits and other options for treatment, the patient has consented to  Procedure(s): TEMPORARY DIALYSIS CATHETER (N/A) as a surgical intervention.  The patient's history has been reviewed, patient examined, no change in status, stable for surgery.  I have reviewed the patient's chart and labs.  Questions were answered to the patient's satisfaction.     Levora Dredge

## 2023-06-12 NOTE — Discharge Planning (Signed)
Working on placement at [facility]. Placement pending.

## 2023-06-12 NOTE — Op Note (Signed)
  OPERATIVE NOTE   PROCEDURE: Insertion of temporary dialysis catheter catheter right femoral approach.  PRE-OPERATIVE DIAGNOSIS: Acute on chronic renal insufficiency requiring hemodialysis  POST-OPERATIVE DIAGNOSIS: Same  SURGEON: Renford Dills M.D.  ANESTHESIA: 1% lidocaine local infiltration  ESTIMATED BLOOD LOSS: Minimal cc  INDICATIONS:   Cassandra Baker is a 82 y.o. female who presents with known chronic renal insufficiency.  She presented to [facility] with worsening of her uremic symptoms.  At this point she needs to start hemodialysis.  Given her BUN of approximately 100 a temporary catheter will be placed and once her uremia has been improved we will convert her to a tunneled catheter.  Risks and benefits were reviewed all questions answered patient agrees to proceed.  DESCRIPTION: After obtaining full informed written consent, the patient was positioned supine. The right was prepped and draped in a sterile fashion. Ultrasound was placed in a sterile sleeve. Ultrasound was utilized to identify the right common femoral vein which is noted to be echolucent and compressible indicating patency. Images recorded for the permanent record. Under real-time visualization a Seldinger needle is inserted into the vein and the guidewires advanced without difficulty. Small counterincision was made at the wire insertion site. Dilator is passed over the wire and the temporary dialysis catheter catheter is fed over the wire without difficulty.  All lumens aspirate and flush easily and are packed with heparin saline. Catheter secured to the skin of the right thigh with 2-0 silk. A sterile dressing is applied with Biopatch.  COMPLICATIONS: None  CONDITION: Unchanged  Levora Dredge Office:  [phone] 06/12/2023, 12:42 PM

## 2023-06-13 DIAGNOSIS — I4721 Torsades de pointes: Secondary | ICD-10-CM

## 2023-06-13 DIAGNOSIS — I5043 Acute on chronic combined systolic (congestive) and diastolic (congestive) heart failure: Secondary | ICD-10-CM

## 2023-06-13 DIAGNOSIS — N390 Urinary tract infection, site not specified: Secondary | ICD-10-CM | POA: Diagnosis not present

## 2023-06-13 DIAGNOSIS — B9620 Unspecified Escherichia coli [E. coli] as the cause of diseases classified elsewhere: Secondary | ICD-10-CM | POA: Diagnosis not present

## 2023-06-13 DIAGNOSIS — N179 Acute kidney failure, unspecified: Secondary | ICD-10-CM

## 2023-06-13 DIAGNOSIS — N186 End stage renal disease: Secondary | ICD-10-CM | POA: Diagnosis not present

## 2023-06-13 DIAGNOSIS — E7800 Pure hypercholesterolemia, unspecified: Secondary | ICD-10-CM

## 2023-06-13 DIAGNOSIS — I214 Non-ST elevation (NSTEMI) myocardial infarction: Secondary | ICD-10-CM

## 2023-06-13 DIAGNOSIS — I5021 Acute systolic (congestive) heart failure: Secondary | ICD-10-CM | POA: Diagnosis not present

## 2023-06-13 DIAGNOSIS — Z992 Dependence on renal dialysis: Secondary | ICD-10-CM

## 2023-06-13 LAB — RENAL FUNCTION PANEL
Albumin: 3.4 g/dL — ABNORMAL LOW (ref 3.5–5.0)
Anion gap: 13 (ref 5–15)
BUN: 70 mg/dL — ABNORMAL HIGH (ref 8–23)
CO2: 24 mmol/L (ref 22–32)
Calcium: 9.4 mg/dL (ref 8.9–10.3)
Chloride: 96 mmol/L — ABNORMAL LOW (ref 98–111)
Creatinine, Ser: 4.68 mg/dL — ABNORMAL HIGH (ref 0.44–1.00)
GFR, Estimated: 9 mL/min — ABNORMAL LOW (ref >60)
Glucose, Bld: 132 mg/dL — ABNORMAL HIGH (ref 70–99)
Phosphorus: 8 mg/dL — ABNORMAL HIGH (ref 2.5–4.6)
Potassium: 4.8 mmol/L (ref 3.5–5.1)
Sodium: 133 mmol/L — ABNORMAL LOW (ref 135–145)

## 2023-06-13 LAB — URINE CULTURE: Culture: 70000 — AB

## 2023-06-13 LAB — GLUCOSE, CAPILLARY
Glucose-Capillary: 131 mg/dL — ABNORMAL HIGH (ref 70–99)
Glucose-Capillary: 186 mg/dL — ABNORMAL HIGH (ref 70–99)
Glucose-Capillary: 114 mg/dL — ABNORMAL HIGH (ref 70–99)
Glucose-Capillary: 179 mg/dL — ABNORMAL HIGH (ref 70–99)
Glucose-Capillary: 154 mg/dL — ABNORMAL HIGH (ref 70–99)

## 2023-06-13 LAB — CBC WITH DIFFERENTIAL/PLATELET
Abs Immature Granulocytes: 0.05 10*3/uL (ref 0.00–0.07)
Basophils Absolute: 0.1 10*3/uL (ref 0.0–0.1)
Basophils Relative: 1 %
Eosinophils Absolute: 0.3 10*3/uL (ref 0.0–0.5)
Eosinophils Relative: 3 %
HCT: 27.6 % — ABNORMAL LOW (ref 36.0–46.0)
Hemoglobin: 8.7 g/dL — ABNORMAL LOW (ref 12.0–15.0)
Immature Granulocytes: 1 %
Lymphocytes Relative: 9 %
Lymphs Abs: 0.7 10*3/uL (ref 0.7–4.0)
MCH: 30.3 pg (ref 26.0–34.0)
MCHC: 31.5 g/dL (ref 30.0–36.0)
MCV: 96.2 fL (ref 80.0–100.0)
Monocytes Absolute: 0.8 10*3/uL (ref 0.1–1.0)
Monocytes Relative: 9 %
Neutro Abs: 6.7 10*3/uL (ref 1.7–7.7)
Neutrophils Relative %: 77 %
Platelets: 175 10*3/uL (ref 150–400)
RBC: 2.87 MIL/uL — ABNORMAL LOW (ref 3.87–5.11)
RDW: 13.1 % (ref 11.5–15.5)
WBC: 8.5 10*3/uL (ref 4.0–10.5)
nRBC: 0 % (ref 0.0–0.2)

## 2023-06-13 LAB — MRSA NEXT GEN BY PCR, NASAL: MRSA by PCR Next Gen: NOT DETECTED

## 2023-06-13 LAB — HEPATITIS B SURFACE ANTIBODY, QUANTITATIVE: Hep B S AB Quant (Post): <3.5 m[IU]/mL — ABNORMAL LOW (ref >9.9)

## 2023-06-13 LAB — MAGNESIUM: Magnesium: 2.5 mg/dL — ABNORMAL HIGH (ref 1.7–2.4)

## 2023-06-13 MED ORDER — HEPARIN BOLUS VIA INFUSION
4000.0000 [IU] | Freq: Once | INTRAVENOUS | Status: AC
  Administered 2023-06-13: 4000 [IU] via INTRAVENOUS
  Filled 2023-06-13: qty 4000

## 2023-06-13 MED ORDER — METOPROLOL SUCCINATE ER 50 MG PO TB24
25.0000 mg | ORAL_TABLET | Freq: Every day | ORAL | Status: DC
  Administered 2023-06-14 – 2023-06-15 (×2): 25 mg via ORAL
  Filled 2023-06-13 (×2): qty 1

## 2023-06-13 MED ORDER — HEPARIN (PORCINE) 25000 UT/250ML-% IV SOLN
1100.0000 [IU]/h | INTRAVENOUS | Status: DC
  Administered 2023-06-13: 1100 [IU]/h via INTRAVENOUS
  Filled 2023-06-13 (×2): qty 250

## 2023-06-13 MED ORDER — SODIUM CHLORIDE 0.9 % IV SOLN
1.0000 g | INTRAVENOUS | Status: DC
  Administered 2023-06-13 – 2023-06-14 (×2): 1 g via INTRAVENOUS
  Filled 2023-06-13 (×2): qty 10

## 2023-06-13 MED ORDER — METOPROLOL SUCCINATE ER 25 MG PO TB24
12.5000 mg | ORAL_TABLET | Freq: Once | ORAL | Status: AC
  Administered 2023-06-13: 12.5 mg via ORAL
  Filled 2023-06-13: qty 0.5

## 2023-06-13 NOTE — Consult Note (Signed)
Cardiology Consultation:   Patient ID: ROZETTA STUMPP; [id number]; September 19, 1941   Admit date: 06/11/2023 Date of Consult: 06/13/2023  Primary Care Provider: Renford Dills, MD Primary Cardiologist: Mariah Milling Primary Electrophysiologist:  None   Patient Profile:   Cassandra Baker is a 82 y.o. female with a hx of CAD with recent NSTEMI medically managed as outlined below, chronic combined systolic and diastolic CHF, ESRD, anemia of chronic disease, DM2, HTN, hypothyroidism, depression, asthma, and OA who was recently admitted to the hospital earlier this month with acute combined CHF and NSTEMI and left AMA who was readmitted with volume overload and who is being seen today for the evaluation of torsades de pointes and ventricular bigeminy at the request of Dr. Myriam Forehand.  History of Present Illness:   Cassandra Baker was previously evaluated by Dr. Katrinka Blazing when he was with [facility] cardiology in [place] many years ago, though does not remember why she was evaluated.  She was admitted to the hospital earlier this month with increasing exertional dyspnea over the preceding year with occasional vague chest discomfort with symptoms becoming more frequent prompting her hospital admission.  He was subsequently admitted with an NSTEMI and acute combined CHF complicated by acute on CKD.  Echo during that admission demonstrated an EF of 30 to 35%, global hypokinesis with severe hypokinesis of the inferior, posterior, and septal wall, mild LVH, grade 1 diastolic dysfunction, normal RV systolic function and ventricular cavity size, moderate mitral regurgitation with moderate mitral annular calcification, and an estimated right atrial pressure of 3 mmHg.  Due to acute on CKD, cardiac cath was not pursued.  She underwent Lexiscan MPI which showed no significant ischemia with a large fixed defect in the inferior and inferolateral wall consistent with prior MI with an EF of 31%.  Overall, this was a high risk scan in the setting  of large infarct.  LHC continue to be deferred secondary to renal dysfunction.  She was treated with 48 hours of IV heparin.  Escalation of GDMT was limited by comorbidities.  She ultimately left AMA on 6/16.  She returned to the hospital on 06/11/2023 with increasing lower extremity swelling with significant fatigue.  She denied chest pain, palpitations, or dyspnea.  BNP remains elevated at 2338.  She subsequently underwent hemodialysis type cath placement with initiation of dialysis on 06/12/2023.  Labs from 06/12/2023 show a potassium of 4.7, BUN 106, serum creatinine 6.42, Hgb 7.8.  On the morning of 06/13/2023, at 0920, she developed torsades de points lasting 13.5 seconds followed by ventricular bigeminy.  Upon reviewing telemetry prior to this episode, it does not appear this was bradycardic induced.  Currently in sinus rhythm and asymptomatic.  During his episode, she reported dizziness and "blacking out."  She was without symptoms of chest pain, diaphoresis, nausea, vomiting, seizure-like activity, or loss of bowel/bladder function.  Currently without complaints of chest pain, dyspnea, dizziness, presyncope, syncope, or palpitations.    Past Medical History:  Diagnosis Date   Anemia    Arthritis    low back,knees   Asthma    Basal cell carcinoma (BCC)    a. nose   CHF (congestive heart failure) (HCC)    a. prev eval by Dr. Katrinka Blazing in [place] - "many yrs ago."   CKD (chronic kidney disease), stage IV (HCC)    Depression    Diabetes mellitus without complication (HCC)    Dyspnea    Hypertension    Hypothyroidism     Past Surgical History:  Procedure  Laterality Date   APPENDECTOMY     4 th grade   AV FISTULA PLACEMENT Left 05/12/2019   Procedure: BRACHIOCEPHALIC ARTERIOVENOUS (AV) FISTULA CREATION LEFT ARM;  Surgeon: Nada Libman, MD;  Location: [facility] OR;  Service: Vascular;  Laterality: Left;   EYE SURGERY  2014   Bilateral 6 mos. apart  cataracts   FISTULA SUPERFICIALIZATION Left  10/06/2019   Procedure: ELEVATION OF ARTERIOVENOUS FISTULA LEFT ARM;  Surgeon: Nada Libman, MD;  Location: [facility] OR;  Service: Vascular;  Laterality: Left;   LYMPH NODE BIOPSY N/A 02/09/2018   Procedure: SENTINEL LYMPH NODE BIOPSY;  Surgeon: Adolphus Birchwood, MD;  Location: [facility];  Service: Gynecology;  Laterality: N/A;   PARATHYROIDECTOMY N/A 12/07/2014   Procedure: PARATHYROIDECTOMY;  Surgeon: Darnell Level, MD;  Location: [facility];  Service: General;  Laterality: N/A;   ROBOTIC ASSISTED TOTAL HYSTERECTOMY WITH BILATERAL SALPINGO OOPHERECTOMY Bilateral 02/09/2018   Procedure: XI ROBOTIC ASSISTED TOTAL HYSTERECTOMY WITH BILATERAL SALPINGO OOPHORECTOMY;  Surgeon: Adolphus Birchwood, MD;  Location: [facility];  Service: Gynecology;  Laterality: Bilateral;   THYROID LOBECTOMY Left 12/07/2014   Procedure: LEFT THYROID LOBECTOMY;  Surgeon: Darnell Level, MD;  Location: [facility];  Service: General;  Laterality: Left;   TONSILLECTOMY     TUBAL LIGATION     Early thirties     Home Meds: Prior to Admission medications   Medication Sig Start Date End Date Taking? Authorizing Provider  acetaminophen (TYLENOL) 500 MG tablet Take 500-1,000 mg by mouth every 6 (six) hours as needed (pain).    Yes [provider]  allopurinol (ZYLOPRIM) 100 MG tablet Take 100 mg by mouth daily as needed.   Yes [provider]  calcitRIOL (ROCALTROL) 0.5 MCG capsule Take 0.5 mcg by mouth daily.  04/01/19  Yes [provider]  calcium acetate (PHOSLO) 667 MG tablet Take 667 mg by mouth 3 (three) times daily.   Yes [provider]  FLUoxetine (PROZAC) 10 MG capsule Take 10 mg by mouth 3 (three) times a week. At night   Yes [provider]  furosemide (LASIX) 40 MG tablet Take 80 mg by mouth 2 (two) times daily.    Yes [provider]  hydrOXYzine (ATARAX/VISTARIL) 10 MG tablet Take 10 mg by mouth at bedtime as needed for itching.   Yes [provider]  levothyroxine (SYNTHROID) 75 MCG  tablet Take 75 mcg by mouth daily before breakfast.   Yes [provider]  loratadine (CLARITIN) 5 MG chewable tablet Chew 5 mg by mouth daily as needed for allergies.   Yes [provider]  sitaGLIPtin (JANUVIA) 25 MG tablet Take 25 mg by mouth daily.   Yes [provider]  sodium bicarbonate 650 MG tablet Take 650 mg by mouth 2 (two) times daily. 04/26/19  Yes [provider]    Inpatient Medications: Scheduled Meds:  aspirin EC  81 mg Oral Daily   atorvastatin  80 mg Oral Daily   Chlorhexidine Gluconate Cloth  6 each Topical Q0600   FLUoxetine  10 mg Oral Once per day on Mon Wed Fri   furosemide  40 mg Intravenous BID   insulin aspart  0-6 Units Subcutaneous TID WC   levothyroxine  75 mcg Oral QAC breakfast   metoprolol succinate  12.5 mg Oral Daily   multivitamin with minerals  1 tablet Oral Daily   sodium bicarbonate  650 mg Oral BID   Continuous Infusions:  anticoagulant sodium citrate     cefTRIAXone (ROCEPHIN)  IV 1 g (06/13/23 1128)   PRN Meds: acetaminophen **OR** acetaminophen, alteplase, anticoagulant sodium citrate, busPIRone, heparin, lidocaine (PF), lidocaine-prilocaine, ondansetron **OR** ondansetron (ZOFRAN) IV, pentafluoroprop-tetrafluoroeth  Allergies:   Allergies  Allergen Reactions   Plum Pulp Anaphylaxis and Cough   Citrus     Coughing, chest tightness    Egg-Derived Products Itching    congestion   Other Itching    Dairy products/Soaps & detergents   Penicillins Rash    Has patient had a PCN reaction causing immediate rash, facial/tongue/throat swelling, SOB or lightheadedness with hypotension: Yes Has patient had a PCN reaction causing severe rash involving mucus membranes or skin necrosis: No Has patient had a PCN reaction that required hospitalization: No Has patient had a PCN reaction occurring within the last 10 years: No If all of the above answers are "NO", then may proceed with Cephalosporin use.     Social  History:   Social History   Socioeconomic History   Marital status: Married    Spouse name: Not on file   Number of children: Not on file   Years of education: Not on file   Highest education level: Not on file  Occupational History   Not on file  Tobacco Use   Smoking status: Never   Smokeless tobacco: Never  Vaping Use   Vaping Use: Never used  Substance and Sexual Activity   Alcohol use: No   Drug use: No   Sexual activity: Not Currently    Birth control/protection: Post-menopausal  Other Topics Concern   Not on file  Social History Narrative   Lives in [place].  Does not routinely exercise.   Social Determinants of Health   Financial Resource Strain: Not on file  Food Insecurity: No Food Insecurity (06/11/2023)   Hunger Vital Sign    Worried About Running Out of Food in the Last Year: Never true    Ran Out of Food in the Last Year: Never true  Transportation Needs: No Transportation Needs (06/11/2023)   PRAPARE - Administrator, Civil Service (Medical): No    Lack of Transportation (Non-Medical): No  Physical Activity: Not on file  Stress: Not on file  Social Connections: Not on file  Intimate Partner Violence: Unknown (06/11/2023)   Humiliation, Afraid, Rape, and Kick questionnaire    Fear of Current or Ex-Partner: Patient declined    Emotionally Abused: No    Physically Abused: No    Sexually Abused: No     Family History:   Family History  Problem Relation Age of Onset   Hypertension Mother        MI's beginning in 4's, died @ 56   Heart disease Mother    Hypertension Father    Heart disease Father    Heart disease Brother    Stroke Brother    Cervical cancer Maternal Aunt    Heart disease Brother    Stroke Brother     ROS:  Review of Systems  Constitutional:  Positive for malaise/fatigue. Negative for chills, diaphoresis, fever and weight loss.  HENT:  Negative for congestion.   Eyes:  Negative for discharge and redness.  Respiratory:   Negative for cough, sputum production, shortness of breath and wheezing.   Cardiovascular:  Negative for chest pain, palpitations, orthopnea, claudication, leg swelling and PND.  Gastrointestinal:  Negative for abdominal pain, heartburn, nausea and vomiting.  Musculoskeletal:  Negative for falls and myalgias.  Skin:  Negative for rash.  Neurological:  Positive for dizziness,  loss of consciousness and weakness. Negative for tingling, tremors, sensory change, speech change and focal weakness.  Endo/Heme/Allergies:  Does not bruise/bleed easily.  Psychiatric/Behavioral:  Negative for substance abuse. The patient is not nervous/anxious.   All other systems reviewed and are negative.     Physical Exam/Data:   Vitals:   06/12/23 2007 06/13/23 0413 06/13/23 0436 06/13/23 0728  BP: (!) 106/48  133/60 129/68  Pulse: 72  80 78  Resp: 20  16 16   Temp: 98.1 F (36.7 C)  98.7 F (37.1 C) 97.8 F (36.6 C)  TempSrc:    Oral  SpO2: 99%  100% 100%  Weight:  67.2 kg    Height:        Intake/Output Summary (Last 24 hours) at 06/13/2023 1148 Last data filed at 06/13/2023 1025 Gross per 24 hour  Intake 840 ml  Output 400 ml  Net 440 ml   Filed Weights   06/12/23 1423 06/12/23 1627 06/13/23 0413  Weight: 63.8 kg 63.8 kg 67.2 kg   Body mass index is 25.42 kg/m.   Physical Exam: General: Well developed, well nourished, in no acute distress. Head: Normocephalic, atraumatic, sclera non-icteric, no xanthomas, nares without discharge.  Neck: Negative for carotid bruits. JVD not elevated. Lungs: Clear bilaterally to auscultation without wheezes, rales, or rhonchi. Breathing is unlabored. Heart: RRR with S1 S2. No murmurs, rubs, or gallops appreciated. Abdomen: Soft, non-tender, non-distended with normoactive bowel sounds. No hepatomegaly. No rebound/guarding. No obvious abdominal masses. Msk:  Strength and tone appear normal for age. Extremities: No clubbing or cyanosis. No edema. Distal pedal  pulses are 2+ and equal bilaterally. Neuro: Alert and oriented X 3. No facial asymmetry. No focal deficit. Moves all extremities spontaneously. Psych:  Responds to questions appropriately with a normal affect.   EKG:  The EKG was personally reviewed and demonstrates: Initial EKG on 06/11/2023 showed NSR, 83 bpm, possible prior inferior and anterior infarct, nonspecific inferolateral ST-T changes.  EKG on 06/13/2023 demonstrated NSR, 72 bpm, possible prior inferior and anterior infarct, nonspecific inferolateral ST-T changes Telemetry:  Telemetry was personally reviewed and demonstrates: Sinus rhythm with development of torsades de points at 0928 lasting for 13.5 seconds followed by ventricular bigeminy, currently in sinus rhythm  Weights: Filed Weights   06/12/23 1423 06/12/23 1627 06/13/23 0413  Weight: 63.8 kg 63.8 kg 67.2 kg    Relevant CV Studies:  2D echo 06/04/2023: 1. Left ventricular ejection fraction, by estimation, is 30 to 35%. The  left ventricle has moderately decreased function. The left ventricle  demonstrates mild global hypokinesis with severe hypokinesis of the  inferior/posterior/septal wall. There is  mild left ventricular hypertrophy. Left ventricular diastolic parameters  are consistent with Grade I diastolic dysfunction (impaired relaxation).   2. Right ventricular systolic function is normal. The right ventricular  size is normal.   3. The mitral valve is normal in structure. Moderate mitral valve  regurgitation. No evidence of mitral stenosis. Moderate mitral annular  calcification.   4. The aortic valve is normal in structure. Aortic valve regurgitation is  not visualized. No aortic stenosis is present. Aortic valve mean gradient  measures 8.0 mmHg.   5. The inferior vena cava is normal in size with greater than 50%  respiratory variability, suggesting right atrial pressure of 3 mmHg.  __________  Eugenie Birks MPI 06/05/2023: Pharmacological myocardial perfusion  imaging study with no significant  ischemia Large fixed defect in the inferior and inferolateral wall consistent with prior MI Hypokinesis of the inferior and  inferolateral wall, EF estimated at 31% EKG changes concerning for ischemia at peak stress/recovery (diffuse ST elevations in the inferior and anterolateral leads V3 through V6. CT attenuation correction images with mild diffuse aortic atherosclerosis, coronary calcification, predominantly in the RCA  High risk scan in the setting of large infarct   Laboratory Data:  Chemistry Recent Labs  Lab 06/11/23 1322 06/12/23 0413 06/13/23 0433  NA 131* 133* 133*  K 5.1 4.7 4.8  CL 95* 100 96*  CO2 21* 20* 24  GLUCOSE 146* 108* 132*  BUN 97* 106* 70*  CREATININE 6.45* 6.42* 4.68*  CALCIUM 9.7 9.3 9.4  GFRNONAA 6* 6* 9*  ANIONGAP 15 13 13     Recent Labs  Lab 06/11/23 1322 06/13/23 0433  PROT 6.6  --   ALBUMIN 3.7 3.4*  AST 15  --   ALT 28  --   ALKPHOS 106  --   BILITOT 0.6  --    Hematology Recent Labs  Lab 06/11/23 1322 06/12/23 0413 06/13/23 0433  WBC 8.8 8.1 8.5  RBC 2.76* 2.58* 2.87*  HGB 8.3* 7.8* 8.7*  HCT 26.7* 24.7* 27.6*  MCV 96.7 95.7 96.2  MCH 30.1 30.2 30.3  MCHC 31.1 31.6 31.5  RDW 13.2 13.0 13.1  PLT 200 179 175   Cardiac EnzymesNo results for input(s): "TROPONINI" in the last 168 hours. No results for input(s): "TROPIPOC" in the last 168 hours.  BNP Recent Labs  Lab 06/11/23 1322  BNP 2,338.2*    DDimer No results for input(s): "DDIMER" in the last 168 hours.  Radiology/Studies:  PERIPHERAL VASCULAR CATHETERIZATION  Result Date: 06/12/2023 See surgical note for result.  DG Chest 2 View  Result Date: 06/11/2023 IMPRESSION: Constellation of findings are favored to reflect mild underlying pulmonary edema. Electronically Signed   By: Meda Klinefelter M.D.   On: 06/11/2023 16:22    Assessment and Plan:   1. Torsades de Pointes: -Developed at 0920 lasting for 13.5 seconds followed by  ventricular bigeminy -Symptomatic with "blacking out" -Currently in sinus rhythm and asymptomatic -Potassium 4.8, magnesium 2.5 at time of episode -IV magnesium has been ordered -No current indication for isoproterenol or mexiletine -Recommend transferring to the ICU for closer monitoring -Will discuss with EP regarding potential recommendations for titration of metoprolol as this does not appear to be bradycardic in etiology -Recommend R/LHC now that she is on hemodialysis on 6/24  2.  CAD with recent NSTEMI: -Admitted to the hospital earlier this month with NSTEMI with LHC being deferred secondary to completion of MI, lack of angina, and acute on chronic renal dysfunction not on hemodialysis at that time -Now readmitted with progressive lower extremity swelling -Now undergoing hemodialysis -Plan for [facility] on 06/15/2023 at approximately 0830 -N.p.o. at midnight hitting into 6/24 -ASA and atorvastatin  3. HFrEF: -Continue IV Lasix and hemodialysis for volume management -Toprol-XL -Not currently on ACE inhibitor/ARB/ARNI/MRA/SGLT2 inhibitor secondary to renal dysfunction -Ischemic evaluation as outlined above -CHF education  4. ESRD on HD: -Hemodialysis per nephrology  5.  Anemia of chronic disease: -Overall relatively stable -Monitor -Ongoing management per IM and nephrology  6.  Hypothyroidism: -TSH normal earlier this month -On replacement therapy   Informed Consent   Shared Decision Making/Informed Consent{  The risks [stroke (1 in 1000), death (1 in 1000), kidney failure [usually temporary] (1 in 500), bleeding (1 in 200), allergic reaction [possibly serious] (1 in 200)], benefits (diagnostic support and management of coronary artery disease) and alternatives of a cardiac catheterization were discussed  in detail with Ms. Eugene and she is willing to proceed.       For questions or updates, please contact [facility] Please consult [email or web address] for contact info  under Cardiology/STEMI.   Signed, Eula Listen, PA-C [facility] Pager: [phone] 06/13/2023, 11:48 AM

## 2023-06-13 NOTE — Progress Notes (Signed)
Notified by telemetry patient experienced run of Torsades and Vent Bigeminy. Spoke to  Dr. Wynelle Link (at bedside) and notified Dr. Myriam Forehand by page. Magnesium add-on ordered at this time. Patient found lying in bed, stated she had just been up walking around the room. Patient at time of occurrence reported no symptoms to RN and MD however later recanted this statement and stated she "blacked out".

## 2023-06-13 NOTE — Progress Notes (Addendum)
Progress Note    Cassandra Baker  ONG:[id number] DOB: [birth year]  DOA: 06/11/2023 PCP: Renford Dills, MD      Brief Narrative:    Medical records reviewed and are as summarized below:  Cassandra Baker is a 82 y.o. 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  She was recently admitted from 06/04/2023 through 06/07/2023 for NSTEMI complicated by acute on chronic systolic CHF, AKI on CKD stage IV.  At that time, she was treated with IV heparin drip and stress test showed old fixed defect.  Left heart catheter was deferred because of CKD.  Plan was to start patient on hemodialysis but she left AGAINST MEDICAL ADVICE.  She presented to the hospital again on 06/11/2023  bilateral leg swelling, fatigue, general weakness and shortness of breath.  She said she had been taking Lasix twice a day at home but her symptoms did not get any better.  She felt anxious and jittery.  She presented to the ED for further management.      Assessment/Plan:   Principal Problem:   ESRD (end stage renal disease) (HCC) Active Problems:   Acute systolic CHF (congestive heart failure) (HCC)   CAD (coronary artery disease)   Type 2 diabetes mellitus (HCC)   Hypothyroidism   Essential hypertension   Anxiety and depression   Pyuria   E. coli UTI    Body mass index is 25.54 kg/m.   ESRD (end stage renal disease) (HCC) Hemodialysis was initiated on 06/12/2023.  Patient refuses to have dialysis today for religious reasons.  She said today is a sabbath day for her because she's SDA.  Discussed with Dr. Wynelle Link, nephrologist.    Acute on chronic systolic CHF (congestive heart failure) (HCC) She is on IV Lasix.   Monitor BMP, daily weight, urine output.   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 Mayford Knife, cardiologist.  Transfer to stepdown unit was recommended for close monitoring.  She also recommended IV heparin drip. Magnesium 2.5, potassium 4.8.   CAD (coronary artery disease) Recently admitted for NSTEMI.  Stress test demonstrated fixed ischemic defect.  Plan for left heart cath on 06/15/2023. Continue aspirin and metoprolol   Type 2 diabetes mellitus (HCC) NovoLog as needed for hyperglycemia.  Januvia on hold.    Acute E. coli UTI She reported some frequency and urgency.   Urine culture showed E. coli.  Restarted IV ceftriaxone. -S/p one-time dose of ceftriaxone on 06/11/2023    Other comorbidities include anemia of chronic disease, hypothyroidism, hypertension, anxiety, depression    Diet Order             Diet NPO time specified Except for: Sips with Meds  Diet effective midnight           Diet heart healthy/carb modified Room service appropriate? Yes; Fluid consistency: Thin  Diet effective now                            Consultants: Nephrologist Vascular surgeon Cardiologist  Procedures: Placement of right femoral temporary dialysis catheter on 06/12/2023    Medications:    aspirin EC  81 mg Oral Daily   atorvastatin  80  mg Oral Daily   Chlorhexidine Gluconate Cloth  6 each Topical Q0600   FLUoxetine  10 mg Oral Once per day on Mon Wed Fri   furosemide  40 mg Intravenous BID   insulin aspart  0-6 Units Subcutaneous TID WC   levothyroxine  75 mcg Oral QAC breakfast   metoprolol succinate  12.5 mg Oral Daily   multivitamin with minerals  1 tablet Oral Daily   sodium bicarbonate  650 mg Oral BID   Continuous Infusions:  anticoagulant sodium citrate     cefTRIAXone (ROCEPHIN)  IV 1 g (06/13/23 1128)     Anti-infectives (From admission, onward)    Start     Dose/Rate Route Frequency Ordered  Stop   06/13/23 0900  cefTRIAXone (ROCEPHIN) 1 g in sodium chloride 0.9 % 100 mL IVPB        1 g 200 mL/hr over 30 Minutes Intravenous Every 24 hours 06/13/23 0812 06/16/23 0859   06/11/23 1630  cefTRIAXone (ROCEPHIN) 2 g in sodium chloride 0.9 % 100 mL IVPB        2 g 200 mL/hr over 30 Minutes Intravenous  Once 06/11/23 1624 06/11/23 1737              Family Communication/Anticipated D/C date and plan/Code Status   DVT prophylaxis:      Code Status: Full Code  Family Communication: None Disposition Plan: Plan to discharge home in 4 to 5 days   Status is: Inpatient Remains inpatient appropriate because: Requires hemodialysis for ESRD, arrhythmia       Subjective:   Interval events noted.  Patient said she had a brief episode of lightheadedness and "blacked out" momentarily while talking to her friend on the phone.  Telemetry showed what looked like torsade de pointes around 9:30 AM on 06/12/2023.  Objective:    Vitals:   06/13/23 0413 06/13/23 0436 06/13/23 0728 06/13/23 1300  BP:  133/60 129/68 119/86  Pulse:  80 78   Resp:  16 16 12   Temp:  98.7 F (37.1 C) 97.8 F (36.6 C) (!) 97.4 F (36.3 C)  TempSrc:   Oral Oral  SpO2:  100% 100% 100%  Weight: 67.2 kg   67.5 kg  Height:       No data found.   Intake/Output Summary (Last 24 hours) at 06/13/2023 1307 Last data filed at 06/13/2023 1025 Gross per 24 hour  Intake 600 ml  Output 400 ml  Net 200 ml   Filed Weights   06/12/23 1627 06/13/23 0413 06/13/23 1300  Weight: 63.8 kg 67.2 kg 67.5 kg    Exam:  GEN: NAD SKIN: Warm and dry EYES: No pallor or icterus ENT: MMM CV: RRR PULM: CTA B ABD: soft, ND, NT, +BS CNS: AAO x 3, non focal EXT: Bilateral leg and pedal edema, no tenderness or erythema      Data Reviewed:   I have personally reviewed following labs and imaging studies:  Labs: Labs show the following:   Basic Metabolic Panel: Recent Labs  Lab 06/07/23 0458 06/11/23 1322  06/12/23 0413 06/13/23 0433  NA 136 131* 133* 133*  K 5.0 5.1 4.7 4.8  CL 101 95* 100 96*  CO2 18* 21* 20* 24  GLUCOSE 126* 146* 108* 132*  BUN 95* 97* 106* 70*  CREATININE 6.48* 6.45* 6.42* 4.68*  CALCIUM 9.7 9.7 9.3 9.4  MG  --   --   --  2.5*  PHOS  --   --   --  8.0*   GFR Estimated Creatinine Clearance: 8.7 mL/min (A) (by C-G formula based on SCr of 4.68 mg/dL (H)). Liver Function Tests: Recent Labs  Lab 06/11/23 1322 06/13/23 0433  AST 15  --   ALT 28  --   ALKPHOS 106  --   BILITOT 0.6  --   PROT 6.6  --   ALBUMIN 3.7 3.4*   No results for input(s): "LIPASE", "AMYLASE" in the last 168 hours. No results for input(s): "AMMONIA" in the last 168 hours. Coagulation profile Recent Labs  Lab 06/12/23 0413  INR 1.3*    CBC: Recent Labs  Lab 06/07/23 0458 06/11/23 1322 06/12/23 0413 06/13/23 0433  WBC 6.5 8.8 8.1 8.5  NEUTROABS  --   --   --  6.7  HGB 8.2* 8.3* 7.8* 8.7*  HCT 26.6* 26.7* 24.7* 27.6*  MCV 97.1 96.7 95.7 96.2  PLT 212 200 179 175   Cardiac Enzymes: No results for input(s): "CKTOTAL", "CKMB", "CKMBINDEX", "TROPONINI" in the last 168 hours. BNP (last 3 results) No results for input(s): "PROBNP" in the last 8760 hours. CBG: Recent Labs  Lab 06/12/23 1332 06/12/23 1728 06/12/23 2227 06/13/23 0729 06/13/23 1143  GLUCAP 158* 122* 116* 131* 186*   D-Dimer: No results for input(s): "DDIMER" in the last 72 hours. Hgb A1c: No results for input(s): "HGBA1C" in the last 72 hours. Lipid Profile: No results for input(s): "CHOL", "HDL", "LDLCALC", "TRIG", "CHOLHDL", "LDLDIRECT" in the last 72 hours. Thyroid function studies: No results for input(s): "TSH", "T4TOTAL", "T3FREE", "THYROIDAB" in the last 72 hours.  Invalid input(s): "FREET3" Anemia work up: No results for input(s): "VITAMINB12", "FOLATE", "FERRITIN", "TIBC", "IRON", "RETICCTPCT" in the last 72 hours. Sepsis Labs: Recent Labs  Lab 06/07/23 0458 06/11/23 1322 06/12/23 0413  06/13/23 0433  WBC 6.5 8.8 8.1 8.5    Microbiology Recent Results (from the past 240 hour(s))  Urine Culture     Status: Abnormal   Collection Time: 06/11/23  2:25 PM   Specimen: Urine, Clean Catch  Result Value Ref Range Status   Specimen Description   Final    URINE, CLEAN CATCH Performed at [facility], [address] [place], [place] [address]    Special Requests   Final    NONE Performed at [facility], [address] [place], [place] [address]    Culture 70,000 COLONIES/mL ESCHERICHIA COLI (A)  Final   Report Status 06/13/2023 FINAL  Final   Organism ID, Bacteria ESCHERICHIA COLI (A)  Final      Susceptibility   Escherichia coli - MIC*    AMPICILLIN >=32 RESISTANT Resistant     CEFAZOLIN <=4 SENSITIVE Sensitive     CEFEPIME <=0.12 SENSITIVE Sensitive     CEFTRIAXONE <=0.25 SENSITIVE Sensitive     CIPROFLOXACIN >=4 RESISTANT Resistant     GENTAMICIN <=1 SENSITIVE Sensitive     IMIPENEM <=0.25 SENSITIVE Sensitive     NITROFURANTOIN <=16 SENSITIVE Sensitive     TRIMETH/SULFA >=320 RESISTANT Resistant     AMPICILLIN/SULBACTAM 8 SENSITIVE Sensitive     PIP/TAZO <=4 SENSITIVE Sensitive     * 70,000 COLONIES/mL ESCHERICHIA COLI    Procedures and diagnostic studies:  PERIPHERAL VASCULAR CATHETERIZATION  Result Date: 06/12/2023 See surgical note for result.  DG Chest 2 View  Result Date: 06/11/2023 CLINICAL DATA:  SOB, eval edema, new onset ESRD EXAM: CHEST - 2 VIEW COMPARISON:  June 04, 2023 FINDINGS: The cardiomediastinal silhouette is unchanged in contour.Mild bronchial cuffing with interstitial prominence. No pleural  effusion. No pneumothorax. Visualized abdomen is unremarkable. Multilevel degenerative changes of the thoracic spine. IMPRESSION: Constellation of findings are favored to reflect mild underlying pulmonary edema. Electronically Signed   By: Meda Klinefelter M.D.   On: 06/11/2023 16:22               LOS: 2 days    Emslee Lopezmartinez  [facility]   Pager on [email or web address]. If 7PM-7AM, please contact night-coverage at [email or web address]     06/13/2023, 1:07 PM

## 2023-06-13 NOTE — Progress Notes (Signed)
Discussed case with EP.  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  Continue plan for starting IV Heparin gtt.  Will increase Toprol XL to 25mg  daily and give a 12.5mg  dose now.  Plan for Cath Monday. If she has further VT then initiate IV Lido gtt.

## 2023-06-13 NOTE — Progress Notes (Signed)
When called for report  nurse informed me that pt was refusing to come to dialysis this am d/t this day being her day of observation.  Primary nurse went into pts room while I was on the phone and asked pt again to come to dialysis and pt again refused to come down for dialysis. Reminded pt that she will also be missing tomortrows tx as we will be closed, Will make MD aware of above.

## 2023-06-13 NOTE — Progress Notes (Signed)
ANTICOAGULATION CONSULT NOTE  Pharmacy Consult for heparin infusion Indication: chest pain/ACS  Allergies  Allergen Reactions   Plum Pulp Anaphylaxis and Cough   Citrus     Coughing, chest tightness    Egg-Derived Products Itching    congestion   Other Itching    Dairy products/Soaps & detergents   Penicillins Rash    Has patient had a PCN reaction causing immediate rash, facial/tongue/throat swelling, SOB or lightheadedness with hypotension: Yes Has patient had a PCN reaction causing severe rash involving mucus membranes or skin necrosis: No Has patient had a PCN reaction that required hospitalization: No Has patient had a PCN reaction occurring within the last 10 years: No If all of the above answers are "NO", then may proceed with Cephalosporin use.     Patient Measurements: Height: 5\' 4"  (162.6 cm) Weight: 67.5 kg (148 lb 13 oz) IBW/kg (Calculated) : 54.7 Heparin Dosing Weight: 66.1 kg  Vital Signs: Temp: 97.4 F (36.3 C) (06/22 1300) Temp Source: Oral (06/22 1300) BP: 119/86 (06/22 1300) Pulse Rate: 78 (06/22 0728)  Labs: Recent Labs    06/11/23 1322 06/12/23 0413 06/13/23 0433  HGB 8.3* 7.8* 8.7*  HCT 26.7* 24.7* 27.6*  PLT 200 179 175  LABPROT  --  16.2*  --   INR  --  1.3*  --   CREATININE 6.45* 6.42* 4.68*    Estimated Creatinine Clearance: 8.7 mL/min (A) (by C-G formula based on SCr of 4.68 mg/dL (H)).   Medical History: Past Medical History:  Diagnosis Date   Anemia    Arthritis    low back,knees   Asthma    Basal cell carcinoma (BCC)    a. nose   CHF (congestive heart failure) (HCC)    a. prev eval by Dr. Katrinka Blazing in [place] - "many yrs ago."   CKD (chronic kidney disease), stage IV (HCC)    Depression    Diabetes mellitus without complication (HCC)    Dyspnea    Hypertension    Hypothyroidism     Medications:  Scheduled:   aspirin EC  81 mg Oral Daily   atorvastatin  80 mg Oral Daily   Chlorhexidine Gluconate Cloth  6 each Topical  Q0600   FLUoxetine  10 mg Oral Once per day on Mon Wed Fri   furosemide  40 mg Intravenous BID   insulin aspart  0-6 Units Subcutaneous TID WC   levothyroxine  75 mcg Oral QAC breakfast   metoprolol succinate  12.5 mg Oral Once   [START ON 06/14/2023] metoprolol succinate  25 mg Oral Daily   multivitamin with minerals  1 tablet Oral Daily   sodium bicarbonate  650 mg Oral BID    Assessment: 82 yo female w/ PMH of  CAD with recent NSTEMI medically managed as outlined below, chronic combined systolic and diastolic CHF, ESRD, anemia of chronic disease, DM2, HTN, hypothyroidism, depression, asthma, and OA who was recently admitted to the hospital earlier this month with acute combined CHF and NSTEMI and left AMA who was readmitted with volume overload and who was seen by cardiology for the evaluation of torsades de pointes and ventricular bigeminy.    Goal of Therapy:  Heparin level 0.3-0.7 units/ml Monitor platelets by anticoagulation protocol: Yes   Plan:  ---Give 4000 units bolus x 1 ---Start heparin infusion at 1100 units/hr (rate based on data from recent admission) ---Check anti-Xa level in 8 hours and daily while on heparin ---Continue to monitor H&H and platelets  Westly Pam  Berneice Gandy 06/13/2023,2:22 PM

## 2023-06-13 NOTE — Progress Notes (Signed)
[facility]  PROGRESS NOTE   Subjective:   Cassandra Baker  is a 82 y.o. with past medical history  of osteoarthritis, asthma, CHF, advanced CKD, depression, diabetes, hypertension, and hypothyroidism. Patient presents to the ED for lower extremity edema and has been admitted for ESRD (end stage renal disease) (HCC) [N18.6] Other hypervolemia [E87.79]  Does not want dialysis today.   Had possible torsades de pointes on telemetry. Cardiology has moved patient to ICU and plans on heart catheterization.   First hemodialysis treatment was yesterday. Tolerated treatment well.    Objective:  Vital signs: Blood pressure 119/86, pulse 78, temperature (!) 97.4 F (36.3 C), temperature source Oral, resp. rate 12, height 5\' 4"  (1.626 m), weight 67.5 kg, SpO2 100 %.  Intake/Output Summary (Last 24 hours) at 06/13/2023 1347 Last data filed at 06/13/2023 1025 Gross per 24 hour  Intake 600 ml  Output 400 ml  Net 200 ml    Filed Weights   06/12/23 1627 06/13/23 0413 06/13/23 1300  Weight: 63.8 kg 67.2 kg 67.5 kg     Physical Exam: General:  No acute distress  Head:  Normocephalic, atraumatic. Moist oral mucosal membranes  Eyes:  Anicteric  Lungs:   Clear to auscultation, normal effort  Heart:  regular  Abdomen:   Soft, nontender, bowel sounds present  Extremities:  1+ peripheral edema.  Neurologic:  Awake, alert, following commands  Skin:  No lesions  Access:  Right femoral temp HD catheter 6/21 Dr. Gilda Crease    Basic Metabolic Panel: Recent Labs  Lab 06/07/23 0458 06/11/23 1322 06/12/23 0413 06/13/23 0433  NA 136 131* 133* 133*  K 5.0 5.1 4.7 4.8  CL 101 95* 100 96*  CO2 18* 21* 20* 24  GLUCOSE 126* 146* 108* 132*  BUN 95* 97* 106* 70*  CREATININE 6.48* 6.45* 6.42* 4.68*  CALCIUM 9.7 9.7 9.3 9.4  MG  --   --   --  2.5*  PHOS  --   --   --  8.0*    GFR: Estimated Creatinine Clearance: 8.7 mL/min (A) (by C-G formula based on SCr of 4.68 mg/dL (H)).  Liver  Function Tests: Recent Labs  Lab 06/11/23 1322 06/13/23 0433  AST 15  --   ALT 28  --   ALKPHOS 106  --   BILITOT 0.6  --   PROT 6.6  --   ALBUMIN 3.7 3.4*    No results for input(s): "LIPASE", "AMYLASE" in the last 168 hours. No results for input(s): "AMMONIA" in the last 168 hours.  CBC: Recent Labs  Lab 06/07/23 0458 06/11/23 1322 06/12/23 0413 06/13/23 0433  WBC 6.5 8.8 8.1 8.5  NEUTROABS  --   --   --  6.7  HGB 8.2* 8.3* 7.8* 8.7*  HCT 26.6* 26.7* 24.7* 27.6*  MCV 97.1 96.7 95.7 96.2  PLT 212 200 179 175      HbA1C: Hgb A1c MFr Bld  Date/Time Value Ref Range Status  06/04/2023 04:08 AM 6.4 (H) 4.8 - 5.6 % Final    Comment:    (NOTE) Pre diabetes:          5.7%-6.4%  Diabetes:              >6.4%  Glycemic control for   <7.0% adults with diabetes   02/04/2018 11:04 AM 6.9 (H) 4.8 - 5.6 % Final    Comment:    (NOTE) Pre diabetes:          5.7%-6.4% Diabetes:              >  6.4% Glycemic control for   <7.0% adults with diabetes     Urinalysis: Recent Labs    06/11/23 1322  COLORURINE STRAW*  LABSPEC 1.006  PHURINE 7.0  GLUCOSEU 50*  HGBUR NEGATIVE  BILIRUBINUR NEGATIVE  KETONESUR NEGATIVE  PROTEINUR 100*  NITRITE NEGATIVE  LEUKOCYTESUR LARGE*       Imaging: PERIPHERAL VASCULAR CATHETERIZATION  Result Date: 06/12/2023 See surgical note for result.  DG Chest 2 View  Result Date: 06/11/2023 CLINICAL DATA:  SOB, eval edema, new onset ESRD EXAM: CHEST - 2 VIEW COMPARISON:  June 04, 2023 FINDINGS: The cardiomediastinal silhouette is unchanged in contour.Mild bronchial cuffing with interstitial prominence. No pleural effusion. No pneumothorax. Visualized abdomen is unremarkable. Multilevel degenerative changes of the thoracic spine. IMPRESSION: Constellation of findings are favored to reflect mild underlying pulmonary edema. Electronically Signed   By: Meda Klinefelter M.D.   On: 06/11/2023 16:22     Medications:    anticoagulant sodium  citrate     cefTRIAXone (ROCEPHIN)  IV 1 g (06/13/23 1128)    aspirin EC  81 mg Oral Daily   atorvastatin  80 mg Oral Daily   Chlorhexidine Gluconate Cloth  6 each Topical Q0600   FLUoxetine  10 mg Oral Once per day on Mon Wed Fri   furosemide  40 mg Intravenous BID   insulin aspart  0-6 Units Subcutaneous TID WC   levothyroxine  75 mcg Oral QAC breakfast   metoprolol succinate  12.5 mg Oral Daily   multivitamin with minerals  1 tablet Oral Daily   sodium bicarbonate  650 mg Oral BID    Assessment/ Plan:     Cassandra Baker is a 82 y.o. female with medical problems of osteoarthritis, asthma, CHF, advanced CKD, depression, diabetes, hypertension, hypothyroidism was admitted on 06/11/2023 for ESRD (end stage renal disease) (HCC) [N18.6] Other hypervolemia [E87.79] for    #1: End stage renal disease requiring dialysis.  - asking to not have dialysis today. This is best seeing ventricular arrhythmia.  - next dialysis for Monday after cardiac catheterization.    #2: Hypertension with end stage renal disease and Congestive heart failure: ECHO form 06/04/23 shows EF 30-35% with mild global hypokinesis and a grade 1 diastolic dysfunction. Acute exacerbation on admission.   - IV furosemide 40mg   - metoprolol   #3: Anemia with chronic kidney disease. Hemoglobin 8.7   - check iron studies.    #4: Secondary Hyperparathyroidism: with hyperphosphatemia Lab Results  Component Value Date   CALCIUM 9.4 06/13/2023   CAION 1.49 (H) 10/06/2019   PHOS 8.0 (H) 06/13/2023    Patient was prescribed calcitriol and calcium acetate outpatient.      LOS: 2 [facility] 6/22/20241:47 PM

## 2023-06-14 DIAGNOSIS — I5043 Acute on chronic combined systolic (congestive) and diastolic (congestive) heart failure: Secondary | ICD-10-CM | POA: Diagnosis not present

## 2023-06-14 DIAGNOSIS — N186 End stage renal disease: Secondary | ICD-10-CM | POA: Diagnosis not present

## 2023-06-14 DIAGNOSIS — B9620 Unspecified Escherichia coli [E. coli] as the cause of diseases classified elsewhere: Secondary | ICD-10-CM | POA: Diagnosis not present

## 2023-06-14 DIAGNOSIS — I420 Dilated cardiomyopathy: Secondary | ICD-10-CM

## 2023-06-14 DIAGNOSIS — I4729 Other ventricular tachycardia: Secondary | ICD-10-CM | POA: Diagnosis not present

## 2023-06-14 DIAGNOSIS — N390 Urinary tract infection, site not specified: Secondary | ICD-10-CM | POA: Diagnosis not present

## 2023-06-14 DIAGNOSIS — I214 Non-ST elevation (NSTEMI) myocardial infarction: Secondary | ICD-10-CM | POA: Diagnosis not present

## 2023-06-14 LAB — CBC
HCT: 26.5 % — ABNORMAL LOW (ref 36.0–46.0)
Hemoglobin: 8.4 g/dL — ABNORMAL LOW (ref 12.0–15.0)
MCH: 30.3 pg (ref 26.0–34.0)
MCHC: 31.7 g/dL (ref 30.0–36.0)
MCV: 95.7 fL (ref 80.0–100.0)
Platelets: 168 10*3/uL (ref 150–400)
RBC: 2.77 MIL/uL — ABNORMAL LOW (ref 3.87–5.11)
RDW: 13.2 % (ref 11.5–15.5)
WBC: 9.4 10*3/uL (ref 4.0–10.5)
nRBC: 0 % (ref 0.0–0.2)

## 2023-06-14 LAB — GLUCOSE, CAPILLARY
Glucose-Capillary: 133 mg/dL — ABNORMAL HIGH (ref 70–99)
Glucose-Capillary: 168 mg/dL — ABNORMAL HIGH (ref 70–99)
Glucose-Capillary: 107 mg/dL — ABNORMAL HIGH (ref 70–99)
Glucose-Capillary: 202 mg/dL — ABNORMAL HIGH (ref 70–99)

## 2023-06-14 LAB — HEPARIN LEVEL (UNFRACTIONATED)
Heparin Unfractionated: 0.48 IU/mL (ref 0.30–0.70)
Heparin Unfractionated: 0.51 IU/mL (ref 0.30–0.70)

## 2023-06-14 MED ORDER — CALCIUM ACETATE (PHOS BINDER) 667 MG PO CAPS
1334.0000 mg | ORAL_CAPSULE | Freq: Three times a day (TID) | ORAL | Status: DC
  Administered 2023-06-14 – 2023-06-16 (×4): 1334 mg via ORAL
  Filled 2023-06-14 (×8): qty 2

## 2023-06-14 MED ORDER — ALPRAZOLAM 0.25 MG PO TABS
0.2500 mg | ORAL_TABLET | Freq: Two times a day (BID) | ORAL | Status: DC | PRN
  Administered 2023-06-14 – 2023-06-15 (×2): 0.25 mg via ORAL
  Filled 2023-06-14 (×2): qty 1

## 2023-06-14 MED ORDER — CHLORHEXIDINE GLUCONATE CLOTH 2 % EX PADS
6.0000 | MEDICATED_PAD | Freq: Every day | CUTANEOUS | Status: DC
  Administered 2023-06-14 – 2023-06-16 (×3): 6 via TOPICAL

## 2023-06-14 MED ORDER — CEPHALEXIN 500 MG PO CAPS
500.0000 mg | ORAL_CAPSULE | Freq: Two times a day (BID) | ORAL | Status: DC
  Administered 2023-06-14 – 2023-06-16 (×5): 500 mg via ORAL
  Filled 2023-06-14 (×5): qty 1

## 2023-06-14 MED ORDER — ALPRAZOLAM 0.25 MG PO TABS: 0.2500 mg | ORAL_TABLET | Freq: Every evening | ORAL | Status: DC | PRN

## 2023-06-14 NOTE — Progress Notes (Signed)
 Progress Note  Patient Name: Cassandra Baker Date of Encounter: 06/14/2023  Primary Cardiologist: Gollan  Subjective   Transferred to the ICU 6/22 with polymorphic VT, felt to be ischemic in etiology. Metoprolol titrated to 25 mg. Potassium and magnesium at goal. Planning for cardiac cath 6/24. No chest pain, palpitations, dizziness, presyncope, or syncope. Dyspnea stable.   Inpatient Medications    Scheduled Meds:  aspirin EC  81 mg Oral Daily   atorvastatin  80 mg Oral Daily   Chlorhexidine Gluconate Cloth  6 each Topical Q0600   FLUoxetine  10 mg Oral Once per day on Mon Wed Fri   furosemide  40 mg Intravenous BID   insulin aspart  0-6 Units Subcutaneous TID WC   levothyroxine  75 mcg Oral QAC breakfast   metoprolol succinate  25 mg Oral Daily   multivitamin with minerals  1 tablet Oral Daily   sodium bicarbonate  650 mg Oral BID   Continuous Infusions:  anticoagulant sodium citrate     cefTRIAXone (ROCEPHIN)  IV Stopped (06/13/23 1128)   heparin 1,100 Units/hr (06/14/23 0700)   PRN Meds: acetaminophen **OR** acetaminophen, alteplase, anticoagulant sodium citrate, busPIRone, heparin, lidocaine (PF), lidocaine-prilocaine, ondansetron **OR** ondansetron (ZOFRAN) IV, pentafluoroprop-tetrafluoroeth   Vital Signs    Vitals:   06/14/23 0400 06/14/23 0500 06/14/23 0600 06/14/23 0700  BP: 139/82 135/63 115/89 124/69  Pulse: 69 71 64 68  Resp: 19 18 18 11  Temp:      TempSrc:      SpO2: 100% 99% 99% 100%  Weight:      Height:        Intake/Output Summary (Last 24 hours) at 06/14/2023 0740 Last data filed at 06/14/2023 0700 Gross per 24 hour  Intake 961.48 ml  Output --  Net 961.48 ml   Filed Weights   06/13/23 0413 06/13/23 1300 06/14/23 0215  Weight: 67.2 kg 67.5 kg 67.6 kg    Telemetry    SR with PACs and PVCs, rare ventricular couplet - Personally Reviewed  ECG    No new tracings - Personally Reviewed  Physical Exam   GEN: No acute distress.   Neck:  No JVD. Cardiac: RRR, no murmurs, rubs, or gallops.  Respiratory: Faint crackles along the bases bilaterally.  GI: Soft, nontender, non-distended.   MS: No edema; No deformity. Neuro:  Alert and oriented x 3; Nonfocal.  Psych: Normal affect.  Labs    Chemistry Recent Labs  Lab 06/11/23 1322 06/12/23 0413 06/13/23 0433  NA 131* 133* 133*  K 5.1 4.7 4.8  CL 95* 100 96*  CO2 21* 20* 24  GLUCOSE 146* 108* 132*  BUN 97* 106* 70*  CREATININE 6.45* 6.42* 4.68*  CALCIUM 9.7 9.3 9.4  PROT 6.6  --   --   ALBUMIN 3.7  --  3.4*  AST 15  --   --   ALT 28  --   --   ALKPHOS 106  --   --   BILITOT 0.6  --   --   GFRNONAA 6* 6* 9*  ANIONGAP 15 13 13     Hematology Recent Labs  Lab 06/12/23 0413 06/13/23 0433 06/14/23 0621  WBC 8.1 8.5 9.4  RBC 2.58* 2.87* 2.77*  HGB 7.8* 8.7* 8.4*  HCT 24.7* 27.6* 26.5*  MCV 95.7 96.2 95.7  MCH 30.2 30.3 30.3  MCHC 31.6 31.5 31.7  RDW 13.0 13.1 13.2  PLT 179 175 168    Cardiac EnzymesNo results for input(s): "TROPONINI"   in the last 168 hours. No results for input(s): "TROPIPOC" in the last 168 hours.   BNP Recent Labs  Lab 06/11/23 1322  BNP 2,338.2*     DDimer No results for input(s): "DDIMER" in the last 168 hours.   Radiology      Cardiac Studies   2D echo 06/04/2023: 1. Left ventricular ejection fraction, by estimation, is 30 to 35%. The  left ventricle has moderately decreased function. The left ventricle  demonstrates mild global hypokinesis with severe hypokinesis of the  inferior/posterior/septal wall. There is  mild left ventricular hypertrophy. Left ventricular diastolic parameters  are consistent with Grade I diastolic dysfunction (impaired relaxation).   2. Right ventricular systolic function is normal. The right ventricular  size is normal.   3. The mitral valve is normal in structure. Moderate mitral valve  regurgitation. No evidence of mitral stenosis. Moderate mitral annular  calcification.   4. The  aortic valve is normal in structure. Aortic valve regurgitation is  not visualized. No aortic stenosis is present. Aortic valve mean gradient  measures 8.0 mmHg.   5. The inferior vena cava is normal in size with greater than 50%  respiratory variability, suggesting right atrial pressure of 3 mmHg.  __________   Lexiscan MPI 06/05/2023: Pharmacological myocardial perfusion imaging study with no significant  ischemia Large fixed defect in the inferior and inferolateral wall consistent with prior MI Hypokinesis of the inferior and inferolateral wall, EF estimated at 31% EKG changes concerning for ischemia at peak stress/recovery (diffuse ST elevations in the inferior and anterolateral leads V3 through V6. CT attenuation correction images with mild diffuse aortic atherosclerosis, coronary calcification, predominantly in the RCA  High risk scan in the setting of large infarct  Patient Profile     82 y.o. female with history of CAD with recent NSTEMI medically managed as outlined below, chronic combined systolic and diastolic CHF, ESRD, anemia of chronic disease, DM2, HTN, hypothyroidism, depression, asthma, and OA who was recently admitted to the hospital earlier this month with acute combined CHF and NSTEMI and left AMA who was readmitted with volume overload and who is being seen today for the evaluation of torsades de pointes and ventricular bigeminy at the request of Dr. Ayiku.   Assessment & Plan    1. Polymorphic VT: -Felt to be ischemic in etiology -Developed on 6/22 at 0920 lasting for 13.5 seconds followed by ventricular bigeminy -Symptomatic with "blacking out" -Currently in sinus rhythm and asymptomatic -Potassium 4.8, magnesium 2.5 at time of episode -No current indication for isoproterenol or mexiletine -Continue titrated dose of metoprolol 25 mg  -If she has further episodes, would start lidocaine gtt -Scheduled for R/LHC on 6/24, now that she is on hemodialysis    2.  CAD  with recent NSTEMI: -Admitted to the hospital earlier this month with NSTEMI with LHC being deferred secondary to completion of MI, lack of angina, and acute on chronic renal dysfunction not on hemodialysis at that time -Now readmitted with progressive lower extremity swelling -Now undergoing hemodialysis -Plan for R/LHC on 06/15/2023 at approximately 0830 -N.p.o. at midnight on 6/24 -ASA and atorvastatin   3. HFrEF: -Continue IV Lasix and hemodialysis for volume management -Toprol-XL -Not currently on ACE inhibitor/ARB/ARNI/MRA/SGLT2 inhibitor secondary to renal dysfunction -Ischemic evaluation as outlined above -CHF education   4. ESRD on HD: -Hemodialysis per nephrology   5.  Anemia of chronic disease: -Overall relatively stable -Monitor -Ongoing management per IM and nephrology   6.  Hypothyroidism: -TSH normal earlier   this month -On replacement therapy   Informed Consent   Shared Decision Making/Informed Consent{  The risks [stroke (1 in 1000), death (1 in 1000), kidney failure [usually temporary] (1 in 500), bleeding (1 in 200), allergic reaction [possibly serious] (1 in 200)], benefits (diagnostic support and management of coronary artery disease) and alternatives of a cardiac catheterization were discussed in detail with Ms. Floyd and she is willing to proceed.      For questions or updates, please contact [facility] Please consult [email or web address] for contact info under Cardiology/STEMI.    Signed, Joffre Lucks, PA-C [facility] Pager: [phone] 06/14/2023, 7:40 AM  

## 2023-06-14 NOTE — Progress Notes (Signed)
[facility]  PROGRESS NOTE   Subjective:   Cassandra Baker  is a 83 y.o. with past medical history  of osteoarthritis, asthma, CHF, advanced CKD, depression, diabetes, hypertension, and hypothyroidism. Patient presents to the ED for lower extremity edema and has been admitted for ESRD (end stage renal disease) (HCC) [N18.6] Other hypervolemia [E87.79]  Granddaughter at bedside.  Held dialysis yesterday due to religious reasons.    Objective:  Vital signs: Blood pressure 119/71, pulse 75, temperature 98.2 F (36.8 C), temperature source Oral, resp. rate 20, height 5\' 4"  (1.626 m), weight 67.6 kg, SpO2 100 %.  Intake/Output Summary (Last 24 hours) at 06/14/2023 1035 Last data filed at 06/14/2023 0700 Gross per 24 hour  Intake 601.48 ml  Output --  Net 601.48 ml    Filed Weights   06/13/23 0413 06/13/23 1300 06/14/23 0215  Weight: 67.2 kg 67.5 kg 67.6 kg     Physical Exam: General:  No acute distress  Head:  Normocephalic, atraumatic. Moist oral mucosal membranes  Eyes:  Anicteric  Lungs:   Clear to auscultation, normal effort  Heart:  regular  Abdomen:   Soft, nontender, bowel sounds present  Extremities:  1+ peripheral edema.  Neurologic:  Awake, alert, following commands  Skin:  No lesions  Access:  Right femoral temp HD catheter 6/21 Dr. Gilda Crease    Basic Metabolic Panel: Recent Labs  Lab 06/11/23 1322 06/12/23 0413 06/13/23 0433  NA 131* 133* 133*  K 5.1 4.7 4.8  CL 95* 100 96*  CO2 21* 20* 24  GLUCOSE 146* 108* 132*  BUN 97* 106* 70*  CREATININE 6.45* 6.42* 4.68*  CALCIUM 9.7 9.3 9.4  MG  --   --  2.5*  PHOS  --   --  8.0*    GFR: Estimated Creatinine Clearance: 8.8 mL/min (A) (by C-G formula based on SCr of 4.68 mg/dL (H)).  Liver Function Tests: Recent Labs  Lab 06/11/23 1322 06/13/23 0433  AST 15  --   ALT 28  --   ALKPHOS 106  --   BILITOT 0.6  --   PROT 6.6  --   ALBUMIN 3.7 3.4*    No results for input(s): "LIPASE",  "AMYLASE" in the last 168 hours. No results for input(s): "AMMONIA" in the last 168 hours.  CBC: Recent Labs  Lab 06/11/23 1322 06/12/23 0413 06/13/23 0433 06/14/23 0621  WBC 8.8 8.1 8.5 9.4  NEUTROABS  --   --  6.7  --   HGB 8.3* 7.8* 8.7* 8.4*  HCT 26.7* 24.7* 27.6* 26.5*  MCV 96.7 95.7 96.2 95.7  PLT 200 179 175 168      HbA1C: Hgb A1c MFr Bld  Date/Time Value Ref Range Status  06/04/2023 04:08 AM 6.4 (H) 4.8 - 5.6 % Final    Comment:    (NOTE) Pre diabetes:          5.7%-6.4%  Diabetes:              >6.4%  Glycemic control for   <7.0% adults with diabetes   02/04/2018 11:04 AM 6.9 (H) 4.8 - 5.6 % Final    Comment:    (NOTE) Pre diabetes:          5.7%-6.4% Diabetes:              >6.4% Glycemic control for   <7.0% adults with diabetes     Urinalysis: Recent Labs    06/11/23 1322  COLORURINE STRAW*  LABSPEC 1.006  PHURINE 7.0  GLUCOSEU 50*  HGBUR NEGATIVE  BILIRUBINUR NEGATIVE  KETONESUR NEGATIVE  PROTEINUR 100*  NITRITE NEGATIVE  LEUKOCYTESUR LARGE*       Imaging: PERIPHERAL VASCULAR CATHETERIZATION  Result Date: 06/12/2023 See surgical note for result.    Medications:    anticoagulant sodium citrate     cefTRIAXone (ROCEPHIN)  IV 1 g (06/14/23 1003)   heparin 1,100 Units/hr (06/14/23 0700)    aspirin EC  81 mg Oral Daily   atorvastatin  80 mg Oral Daily   Chlorhexidine Gluconate Cloth  6 each Topical Q0600   FLUoxetine  10 mg Oral Once per day on Mon Wed Fri   furosemide  40 mg Intravenous BID   insulin aspart  0-6 Units Subcutaneous TID WC   levothyroxine  75 mcg Oral QAC breakfast   metoprolol succinate  25 mg Oral Daily   multivitamin with minerals  1 tablet Oral Daily   sodium bicarbonate  650 mg Oral BID    Assessment/ Plan:     Cassandra Baker is a 82 y.o. female with medical problems of osteoarthritis, asthma, CHF, advanced CKD, depression, diabetes, hypertension, hypothyroidism was admitted on 06/11/2023 for ESRD (end  stage renal disease) (HCC) [N18.6] Other hypervolemia [E87.79] for    #1: End stage renal disease requiring dialysis.  - next dialysis for Monday after cardiac catheterization.   - outpatient dialysis planning. Patient prefers MWF schedule.   #2: Hypertension with end stage renal disease and Congestive heart failure: ECHO form 06/04/23 shows EF 30-35% with mild global hypokinesis and a grade 1 diastolic dysfunction. Acute exacerbation on admission.   - IV furosemide 40mg  daily  - Continue metoprolol  - consider adding ARB before discharge.    #3: Anemia with chronic kidney disease. Hemoglobin 8.4   - check iron studies. - Holding ESA due to concerns for cardiac ischemia.     #4: Secondary Hyperparathyroidism: with hyperphosphatemia Lab Results  Component Value Date   CALCIUM 9.4 06/13/2023   CAION 1.49 (H) 10/06/2019   PHOS 8.0 (H) 06/13/2023    Patient was prescribed calcitriol and calcium acetate outpatient.  - restart calcium acetate with meals.       LOS: 3 [facility] 6/23/202410:35 AM

## 2023-06-14 NOTE — Progress Notes (Signed)
ANTICOAGULATION CONSULT NOTE  Pharmacy Consult for heparin infusion Indication: chest pain/ACS  Allergies  Allergen Reactions   Plum Pulp Anaphylaxis and Cough   Citrus     Coughing, chest tightness    Egg-Derived Products Itching    congestion   Other Itching    Dairy products/Soaps & detergents   Penicillins Rash    Has patient had a PCN reaction causing immediate rash, facial/tongue/throat swelling, SOB or lightheadedness with hypotension: Yes Has patient had a PCN reaction causing severe rash involving mucus membranes or skin necrosis: No Has patient had a PCN reaction that required hospitalization: No Has patient had a PCN reaction occurring within the last 10 years: No If all of the above answers are "NO", then may proceed with Cephalosporin use.     Patient Measurements: Height: 5\' 4"  (162.6 cm) Weight: 67.5 kg (148 lb 13 oz) IBW/kg (Calculated) : 54.7 Heparin Dosing Weight: 66.1 kg  Vital Signs: Temp: 98.2 F (36.8 C) (06/22 2000) Temp Source: Oral (06/22 2000) BP: 118/61 (06/22 2300) Pulse Rate: 65 (06/22 2300)  Labs: Recent Labs    06/11/23 1322 06/12/23 0413 06/13/23 0433 06/13/23 2350  HGB 8.3* 7.8* 8.7*  --   HCT 26.7* 24.7* 27.6*  --   PLT 200 179 175  --   LABPROT  --  16.2*  --   --   INR  --  1.3*  --   --   HEPARINUNFRC  --   --   --  0.48  CREATININE 6.45* 6.42* 4.68*  --      Estimated Creatinine Clearance: 8.7 mL/min (A) (by C-G formula based on SCr of 4.68 mg/dL (H)).   Medical History: Past Medical History:  Diagnosis Date   Anemia    Arthritis    low back,knees   Asthma    Basal cell carcinoma (BCC)    a. nose   CHF (congestive heart failure) (HCC)    a. prev eval by Dr. Katrinka Blazing in [place] - "many yrs ago."   CKD (chronic kidney disease), stage IV (HCC)    Depression    Diabetes mellitus without complication (HCC)    Dyspnea    Hypertension    Hypothyroidism     Medications:  Scheduled:   aspirin EC  81 mg Oral Daily    atorvastatin  80 mg Oral Daily   Chlorhexidine Gluconate Cloth  6 each Topical Q0600   FLUoxetine  10 mg Oral Once per day on Mon Wed Fri   furosemide  40 mg Intravenous BID   insulin aspart  0-6 Units Subcutaneous TID WC   levothyroxine  75 mcg Oral QAC breakfast   metoprolol succinate  25 mg Oral Daily   multivitamin with minerals  1 tablet Oral Daily   sodium bicarbonate  650 mg Oral BID    Assessment: 82 yo female w/ PMH of  CAD with recent NSTEMI medically managed as outlined below, chronic combined systolic and diastolic CHF, ESRD, anemia of chronic disease, DM2, HTN, hypothyroidism, depression, asthma, and OA who was recently admitted to the hospital earlier this month with acute combined CHF and NSTEMI and left AMA who was readmitted with volume overload and who was seen by cardiology for the evaluation of torsades de pointes and ventricular bigeminy.    Goal of Therapy:  Heparin level 0.3-0.7 units/ml Monitor platelets by anticoagulation protocol: Yes   6/22 2350 HL 0.48, therapeutic x 1  Plan:  Heparin level therapeutic Continue heparin infusion at 1100 units/hr Recheck  heparin level in 8 hours to confirm Daily CBC while on heparin  Barrie Folk, PharmD 06/14/2023,12:15 AM

## 2023-06-14 NOTE — Progress Notes (Signed)
Progress Note    Cassandra Baker  ZOX:[id number] DOB: 01/26/1941  DOA: 06/11/2023 PCP: Renford Dills, MD      Brief Narrative:    Medical records reviewed and are as summarized below:  Cassandra Baker is a 82 y.o. 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  She was recently admitted from 06/04/2023 through 06/07/2023 for NSTEMI complicated by acute on chronic systolic CHF, AKI on CKD stage IV.  At that time, she was treated with IV heparin drip and stress test showed old fixed defect.  Left heart catheter was deferred because of CKD.  Plan was to start patient on hemodialysis but she left AGAINST MEDICAL ADVICE.  She presented to the hospital again on 06/11/2023  bilateral leg swelling, fatigue, general weakness and shortness of breath.  She said she had been taking Lasix twice a day at home but her symptoms did not get any better.  She felt anxious and jittery.  She presented to the ED for further management.      Assessment/Plan:   Principal Problem:   ESRD (end stage renal disease) (HCC) Active Problems:   Non-ST elevation (NSTEMI) myocardial infarction (HCC)   Acute on chronic combined systolic and diastolic CHF (congestive heart failure) (HCC)   Acute systolic CHF (congestive heart failure) (HCC)   CAD (coronary artery disease)   Type 2 diabetes mellitus (HCC)   Hypothyroidism   Essential hypertension   Anxiety and depression   Pyuria   DCM (dilated cardiomyopathy) (HCC)   E. coli UTI   Polymorphic ventricular tachycardia (HCC)    Body mass index is 25.58 kg/m.   ESRD (end stage renal disease) (HCC) Hemodialysis was initiated on 06/12/2023.  Plan for hemodialysis tomorrow.  Follow-up with nephrologist    Acute on chronic systolic CHF (congestive heart failure) (HCC) She remains on IV  Lasix. Monitor BMP, daily weight, urine output.   Arrhythmia/ polymorphic ventricular tachycardia and ventricular bigeminy Episode was very brief but symptomatic (lightheadedness and "blacked out") around 9:20 AM on 06/13/2023.[name] Kitchen Normal QTc. Continue Toprol-XL    CAD (coronary artery disease) Recently admitted for NSTEMI.  Stress test demonstrated fixed ischemic defect.  Plan for left heart cath tomorrow Continue aspirin and metoprolol   Type 2 diabetes mellitus (HCC) NovoLog as needed for hyperglycemia.  Januvia on hold.    Acute E. coli UTI She reported some frequency and urgency.   Urine culture showed E. coli.  Switch IV ceftriaxone to Keflex    Other comorbidities include anemia of chronic disease, hypothyroidism, hypertension, anxiety, depression    Diet Order             Diet NPO time specified Except for: Sips with Meds  Diet effective midnight           Diet heart healthy/carb modified Room service appropriate? Yes; Fluid consistency: Thin  Diet effective now                            Consultants: Nephrologist Vascular surgeon Cardiologist  Procedures: Placement of right femoral temporary dialysis catheter on 06/12/2023    Medications:    aspirin EC  81 mg Oral Daily   atorvastatin  80 mg Oral Daily   calcium acetate  1,334 mg Oral TID WC   Chlorhexidine  Gluconate Cloth  6 each Topical Q0600   FLUoxetine  10 mg Oral Once per day on Mon Wed Fri   furosemide  40 mg Intravenous BID   insulin aspart  0-6 Units Subcutaneous TID WC   levothyroxine  75 mcg Oral QAC breakfast   metoprolol succinate  25 mg Oral Daily   multivitamin with minerals  1 tablet Oral Daily   sodium bicarbonate  650 mg Oral BID   Continuous Infusions:  anticoagulant sodium citrate     cefTRIAXone (ROCEPHIN)  IV 1 g (06/14/23 1003)   heparin 1,100 Units/hr (06/14/23 0700)     Anti-infectives (From admission, onward)    Start     Dose/Rate Route Frequency  Ordered Stop   06/13/23 0900  cefTRIAXone (ROCEPHIN) 1 g in sodium chloride 0.9 % 100 mL IVPB        1 g 200 mL/hr over 30 Minutes Intravenous Every 24 hours 06/13/23 0812 06/16/23 0859   06/11/23 1630  cefTRIAXone (ROCEPHIN) 2 g in sodium chloride 0.9 % 100 mL IVPB        2 g 200 mL/hr over 30 Minutes Intravenous  Once 06/11/23 1624 06/11/23 1737              Family Communication/Anticipated D/C date and plan/Code Status   DVT prophylaxis:      Code Status: Full Code  Family Communication: None Disposition Plan: Plan to discharge home in 4 to 5 days   Status is: Inpatient Remains inpatient appropriate because: Requires hemodialysis for ESRD, arrhythmia       Subjective:   Interval events noted.  No dizziness, palpitations, chest pain or shortness of breath  Objective:    Vitals:   06/14/23 0600 06/14/23 0700 06/14/23 0800 06/14/23 0900  BP: 115/89 124/69 132/73 119/71  Pulse: 64 68 76 75  Resp: 18 11 13 20   Temp:      TempSrc:      SpO2: 99% 100% 100% 100%  Weight:      Height:       No data found.   Intake/Output Summary (Last 24 hours) at 06/14/2023 1320 Last data filed at 06/14/2023 0700 Gross per 24 hour  Intake 601.44 ml  Output --  Net 601.44 ml   Filed Weights   06/13/23 0413 06/13/23 1300 06/14/23 0215  Weight: 67.2 kg 67.5 kg 67.6 kg    Exam:   GEN: NAD SKIN: No rash EYES: EOMI ENT: MMM CV: RRR PULM: CTA B ABD: soft, ND, NT, +BS CNS: AAO x 3, non focal EXT: Bilateral leg edema, no tenderness   Data Reviewed:   I have personally reviewed following labs and imaging studies:  Labs: Labs show the following:   Basic Metabolic Panel: Recent Labs  Lab 06/11/23 1322 06/12/23 0413 06/13/23 0433  NA 131* 133* 133*  K 5.1 4.7 4.8  CL 95* 100 96*  CO2 21* 20* 24  GLUCOSE 146* 108* 132*  BUN 97* 106* 70*  CREATININE 6.45* 6.42* 4.68*  CALCIUM 9.7 9.3 9.4  MG  --   --  2.5*  PHOS  --   --  8.0*   GFR Estimated  Creatinine Clearance: 8.8 mL/min (A) (by C-G formula based on SCr of 4.68 mg/dL (H)). Liver Function Tests: Recent Labs  Lab 06/11/23 1322 06/13/23 0433  AST 15  --   ALT 28  --   ALKPHOS 106  --   BILITOT 0.6  --   PROT 6.6  --   ALBUMIN  3.7 3.4*   No results for input(s): "LIPASE", "AMYLASE" in the last 168 hours. No results for input(s): "AMMONIA" in the last 168 hours. Coagulation profile Recent Labs  Lab 06/12/23 0413  INR 1.3*    CBC: Recent Labs  Lab 06/11/23 1322 06/12/23 0413 06/13/23 0433 06/14/23 0621  WBC 8.8 8.1 8.5 9.4  NEUTROABS  --   --  6.7  --   HGB 8.3* 7.8* 8.7* 8.4*  HCT 26.7* 24.7* 27.6* 26.5*  MCV 96.7 95.7 96.2 95.7  PLT 200 179 175 168   Cardiac Enzymes: No results for input(s): "CKTOTAL", "CKMB", "CKMBINDEX", "TROPONINI" in the last 168 hours. BNP (last 3 results) No results for input(s): "PROBNP" in the last 8760 hours. CBG: Recent Labs  Lab 06/13/23 1306 06/13/23 1615 06/13/23 2214 06/14/23 0728 06/14/23 1137  GLUCAP 114* 179* 154* 133* 168*   D-Dimer: No results for input(s): "DDIMER" in the last 72 hours. Hgb A1c: No results for input(s): "HGBA1C" in the last 72 hours. Lipid Profile: No results for input(s): "CHOL", "HDL", "LDLCALC", "TRIG", "CHOLHDL", "LDLDIRECT" in the last 72 hours. Thyroid function studies: No results for input(s): "TSH", "T4TOTAL", "T3FREE", "THYROIDAB" in the last 72 hours.  Invalid input(s): "FREET3" Anemia work up: No results for input(s): "VITAMINB12", "FOLATE", "FERRITIN", "TIBC", "IRON", "RETICCTPCT" in the last 72 hours. Sepsis Labs: Recent Labs  Lab 06/11/23 1322 06/12/23 0413 06/13/23 0433 06/14/23 0621  WBC 8.8 8.1 8.5 9.4    Microbiology Recent Results (from the past 240 hour(s))  Urine Culture     Status: Abnormal   Collection Time: 06/11/23  2:25 PM   Specimen: Urine, Clean Catch  Result Value Ref Range Status   Specimen Description   Final    URINE, CLEAN CATCH Performed  at [facility], [address] [place], [place] [address]    Special Requests   Final    NONE Performed at [facility], [address] [place], [place] [address]    Culture 70,000 COLONIES/mL ESCHERICHIA COLI (A)  Final   Report Status 06/13/2023 FINAL  Final   Organism ID, Bacteria ESCHERICHIA COLI (A)  Final      Susceptibility   Escherichia coli - MIC*    AMPICILLIN >=32 RESISTANT Resistant     CEFAZOLIN <=4 SENSITIVE Sensitive     CEFEPIME <=0.12 SENSITIVE Sensitive     CEFTRIAXONE <=0.25 SENSITIVE Sensitive     CIPROFLOXACIN >=4 RESISTANT Resistant     GENTAMICIN <=1 SENSITIVE Sensitive     IMIPENEM <=0.25 SENSITIVE Sensitive     NITROFURANTOIN <=16 SENSITIVE Sensitive     TRIMETH/SULFA >=320 RESISTANT Resistant     AMPICILLIN/SULBACTAM 8 SENSITIVE Sensitive     PIP/TAZO <=4 SENSITIVE Sensitive     * 70,000 COLONIES/mL ESCHERICHIA COLI  MRSA Next Gen by PCR, Nasal     Status: None   Collection Time: 06/13/23  1:03 PM   Specimen: Nasal Mucosa; Nasal Swab  Result Value Ref Range Status   MRSA by PCR Next Gen NOT DETECTED NOT DETECTED Final    Comment: (NOTE) The GeneXpert MRSA Assay (FDA approved for NASAL specimens only), is one component of a comprehensive MRSA colonization surveillance program. It is not intended to diagnose MRSA infection nor to guide or monitor treatment for MRSA infections. Test performance is not FDA approved in patients less than 93 years old. Performed at [facility], [address] [place], [place] [address]     Procedures and diagnostic studies:  No results found.  LOS: 3 days   Jaycen Vercher  [facility]   Pager on [email or web address]. If 7PM-7AM, please contact night-coverage at [email or web address]     06/14/2023, 1:20 PM

## 2023-06-14 NOTE — Progress Notes (Signed)
ANTICOAGULATION CONSULT NOTE  Pharmacy Consult for heparin Indication: chest pain/ACS  Allergies  Allergen Reactions   Plum Pulp Anaphylaxis and Cough   Citrus     Coughing, chest tightness    Egg-Derived Products Itching    congestion   Other Itching    Dairy products/Soaps & detergents   Penicillins Rash    Has patient had a PCN reaction causing immediate rash, facial/tongue/throat swelling, SOB or lightheadedness with hypotension: Yes Has patient had a PCN reaction causing severe rash involving mucus membranes or skin necrosis: No Has patient had a PCN reaction that required hospitalization: No Has patient had a PCN reaction occurring within the last 10 years: No If all of the above answers are "NO", then may proceed with Cephalosporin use.     Patient Measurements: Height: 5\' 4"  (162.6 cm) Weight: 67.6 kg (149 lb 0.5 oz) IBW/kg (Calculated) : 54.7 Heparin Dosing Weight: 66.1 kg  Vital Signs: Temp: 98.2 F (36.8 C) (06/23 0200) Temp Source: Oral (06/23 0200) BP: 132/73 (06/23 0800) Pulse Rate: 76 (06/23 0800)  Labs: Recent Labs    06/11/23 1322 06/12/23 0413 06/13/23 0433 06/13/23 2350 06/14/23 0621 06/14/23 0746  HGB 8.3* 7.8* 8.7*  --  8.4*  --   HCT 26.7* 24.7* 27.6*  --  26.5*  --   PLT 200 179 175  --  168  --   LABPROT  --  16.2*  --   --   --   --   INR  --  1.3*  --   --   --   --   HEPARINUNFRC  --   --   --  0.48  --  0.51  CREATININE 6.45* 6.42* 4.68*  --   --   --      Estimated Creatinine Clearance: 8.8 mL/min (A) (by C-G formula based on SCr of 4.68 mg/dL (H)).   Medical History: Past Medical History:  Diagnosis Date   Anemia    Arthritis    low back,knees   Asthma    Basal cell carcinoma (BCC)    a. nose   CHF (congestive heart failure) (HCC)    a. prev eval by Dr. Katrinka Blazing in [place] - "many yrs ago."   CKD (chronic kidney disease), stage IV (HCC)    Depression    Diabetes mellitus without complication (HCC)    Dyspnea     Hypertension    Hypothyroidism     Medications:  Scheduled:   aspirin EC  81 mg Oral Daily   atorvastatin  80 mg Oral Daily   Chlorhexidine Gluconate Cloth  6 each Topical Q0600   FLUoxetine  10 mg Oral Once per day on Mon Wed Fri   furosemide  40 mg Intravenous BID   insulin aspart  0-6 Units Subcutaneous TID WC   levothyroxine  75 mcg Oral QAC breakfast   metoprolol succinate  25 mg Oral Daily   multivitamin with minerals  1 tablet Oral Daily   sodium bicarbonate  650 mg Oral BID    Assessment: 82 yo female w/ PMH of  CAD with recent NSTEMI medically managed as outlined below, chronic combined systolic and diastolic CHF, ESRD, anemia of chronic disease, DM2, HTN, hypothyroidism, depression, asthma, and OA who was recently admitted to the hospital earlier this month with acute combined CHF and NSTEMI and left AMA who was readmitted with volume overload and who was seen by cardiology for the evaluation of torsades de pointes and ventricular bigeminy.  6/22 2350 HL 0.48  6/23 0746 HL 0.51.   Goal of Therapy:  Heparin level 0.3-0.7 units/ml Monitor platelets by anticoagulation protocol: Yes   Plan:  Heparin level is therapeutic. Continue heparin infusion at 1100 units/hr. Recheck heparin level and CBC with AM labs.   Ronnald Ramp, PharmD 06/14/2023,8:54 AM

## 2023-06-14 NOTE — H&P (View-Only) (Signed)
Progress Note  Patient Name: Cassandra Baker Date of Encounter: 06/14/2023  Primary Cardiologist: Mariah Milling  Subjective   Transferred to the ICU 6/22 with polymorphic VT, felt to be ischemic in etiology. Metoprolol titrated to 25 mg. Potassium and magnesium at goal. Planning for cardiac cath 6/24. No chest pain, palpitations, dizziness, presyncope, or syncope. Dyspnea stable.   Inpatient Medications    Scheduled Meds:  aspirin EC  81 mg Oral Daily   atorvastatin  80 mg Oral Daily   Chlorhexidine Gluconate Cloth  6 each Topical Q0600   FLUoxetine  10 mg Oral Once per day on Mon Wed Fri   furosemide  40 mg Intravenous BID   insulin aspart  0-6 Units Subcutaneous TID WC   levothyroxine  75 mcg Oral QAC breakfast   metoprolol succinate  25 mg Oral Daily   multivitamin with minerals  1 tablet Oral Daily   sodium bicarbonate  650 mg Oral BID   Continuous Infusions:  anticoagulant sodium citrate     cefTRIAXone (ROCEPHIN)  IV Stopped (06/13/23 1128)   heparin 1,100 Units/hr (06/14/23 0700)   PRN Meds: acetaminophen **OR** acetaminophen, alteplase, anticoagulant sodium citrate, busPIRone, heparin, lidocaine (PF), lidocaine-prilocaine, ondansetron **OR** ondansetron (ZOFRAN) IV, pentafluoroprop-tetrafluoroeth   Vital Signs    Vitals:   06/14/23 0400 06/14/23 0500 06/14/23 0600 06/14/23 0700  BP: 139/82 135/63 115/89 124/69  Pulse: 69 71 64 68  Resp: 19 18 18 11   Temp:      TempSrc:      SpO2: 100% 99% 99% 100%  Weight:      Height:        Intake/Output Summary (Last 24 hours) at 06/14/2023 0740 Last data filed at 06/14/2023 0700 Gross per 24 hour  Intake 961.48 ml  Output --  Net 961.48 ml   Filed Weights   06/13/23 0413 06/13/23 1300 06/14/23 0215  Weight: 67.2 kg 67.5 kg 67.6 kg    Telemetry    SR with PACs and PVCs, rare ventricular couplet - Personally Reviewed  ECG    No new tracings - Personally Reviewed  Physical Exam   GEN: No acute distress.   Neck:  No JVD. Cardiac: RRR, no murmurs, rubs, or gallops.  Respiratory: Faint crackles along the bases bilaterally.  GI: Soft, nontender, non-distended.   MS: No edema; No deformity. Neuro:  Alert and oriented x 3; Nonfocal.  Psych: Normal affect.  Labs    Chemistry Recent Labs  Lab 06/11/23 1322 06/12/23 0413 06/13/23 0433  NA 131* 133* 133*  K 5.1 4.7 4.8  CL 95* 100 96*  CO2 21* 20* 24  GLUCOSE 146* 108* 132*  BUN 97* 106* 70*  CREATININE 6.45* 6.42* 4.68*  CALCIUM 9.7 9.3 9.4  PROT 6.6  --   --   ALBUMIN 3.7  --  3.4*  AST 15  --   --   ALT 28  --   --   ALKPHOS 106  --   --   BILITOT 0.6  --   --   GFRNONAA 6* 6* 9*  ANIONGAP 15 13 13      Hematology Recent Labs  Lab 06/12/23 0413 06/13/23 0433 06/14/23 0621  WBC 8.1 8.5 9.4  RBC 2.58* 2.87* 2.77*  HGB 7.8* 8.7* 8.4*  HCT 24.7* 27.6* 26.5*  MCV 95.7 96.2 95.7  MCH 30.2 30.3 30.3  MCHC 31.6 31.5 31.7  RDW 13.0 13.1 13.2  PLT 179 175 168    Cardiac EnzymesNo results for input(s): "TROPONINI"  in the last 168 hours. No results for input(s): "TROPIPOC" in the last 168 hours.   BNP Recent Labs  Lab 06/11/23 1322  BNP 2,338.2*     DDimer No results for input(s): "DDIMER" in the last 168 hours.   Radiology      Cardiac Studies   2D echo 06/04/2023: 1. Left ventricular ejection fraction, by estimation, is 30 to 35%. The  left ventricle has moderately decreased function. The left ventricle  demonstrates mild global hypokinesis with severe hypokinesis of the  inferior/posterior/septal wall. There is  mild left ventricular hypertrophy. Left ventricular diastolic parameters  are consistent with Grade I diastolic dysfunction (impaired relaxation).   2. Right ventricular systolic function is normal. The right ventricular  size is normal.   3. The mitral valve is normal in structure. Moderate mitral valve  regurgitation. No evidence of mitral stenosis. Moderate mitral annular  calcification.   4. The  aortic valve is normal in structure. Aortic valve regurgitation is  not visualized. No aortic stenosis is present. Aortic valve mean gradient  measures 8.0 mmHg.   5. The inferior vena cava is normal in size with greater than 50%  respiratory variability, suggesting right atrial pressure of 3 mmHg.  __________   Eugenie Birks MPI 06/05/2023: Pharmacological myocardial perfusion imaging study with no significant  ischemia Large fixed defect in the inferior and inferolateral wall consistent with prior MI Hypokinesis of the inferior and inferolateral wall, EF estimated at 31% EKG changes concerning for ischemia at peak stress/recovery (diffuse ST elevations in the inferior and anterolateral leads V3 through V6. CT attenuation correction images with mild diffuse aortic atherosclerosis, coronary calcification, predominantly in the RCA  High risk scan in the setting of large infarct  Patient Profile     82 y.o. female with history of CAD with recent NSTEMI medically managed as outlined below, chronic combined systolic and diastolic CHF, ESRD, anemia of chronic disease, DM2, HTN, hypothyroidism, depression, asthma, and OA who was recently admitted to the hospital earlier this month with acute combined CHF and NSTEMI and left AMA who was readmitted with volume overload and who is being seen today for the evaluation of torsades de pointes and ventricular bigeminy at the request of Dr. Myriam Forehand.   Assessment & Plan    1. Polymorphic VT: -Felt to be ischemic in etiology -Developed on 6/22 at 0920 lasting for 13.5 seconds followed by ventricular bigeminy -Symptomatic with "blacking out" -Currently in sinus rhythm and asymptomatic -Potassium 4.8, magnesium 2.5 at time of episode -No current indication for isoproterenol or mexiletine -Continue titrated dose of metoprolol 25 mg  -If she has further episodes, would start lidocaine gtt -Scheduled for [facility] on 6/24, now that she is on hemodialysis    2.  CAD  with recent NSTEMI: -Admitted to the hospital earlier this month with NSTEMI with LHC being deferred secondary to completion of MI, lack of angina, and acute on chronic renal dysfunction not on hemodialysis at that time -Now readmitted with progressive lower extremity swelling -Now undergoing hemodialysis -Plan for [facility] on 06/15/2023 at approximately 0830 -N.p.o. at midnight on 6/24 -ASA and atorvastatin   3. HFrEF: -Continue IV Lasix and hemodialysis for volume management -Toprol-XL -Not currently on ACE inhibitor/ARB/ARNI/MRA/SGLT2 inhibitor secondary to renal dysfunction -Ischemic evaluation as outlined above -CHF education   4. ESRD on HD: -Hemodialysis per nephrology   5.  Anemia of chronic disease: -Overall relatively stable -Monitor -Ongoing management per IM and nephrology   6.  Hypothyroidism: -TSH normal earlier  this month -On replacement therapy   Informed Consent   Shared Decision Making/Informed Consent{  The risks [stroke (1 in 1000), death (1 in 1000), kidney failure [usually temporary] (1 in 500), bleeding (1 in 200), allergic reaction [possibly serious] (1 in 200)], benefits (diagnostic support and management of coronary artery disease) and alternatives of a cardiac catheterization were discussed in detail with Ms. Cappelli and she is willing to proceed.      For questions or updates, please contact [facility] Please consult [email or web address] for contact info under Cardiology/STEMI.    Signed, Eula Listen, PA-C [facility] Pager: [phone] 06/14/2023, 7:40 AM

## 2023-06-15 ENCOUNTER — Encounter: Payer: Self-pay | Admitting: Vascular Surgery

## 2023-06-15 ENCOUNTER — Encounter
Admission: EM | Disposition: A | Discharge status: Other Acute Inpatient Hospital | Payer: Self-pay | Source: Home / Self Care | Attending: Internal Medicine

## 2023-06-15 ENCOUNTER — Encounter (HOSPITAL_COMMUNITY): Payer: Self-pay

## 2023-06-15 DIAGNOSIS — I472 Ventricular tachycardia, unspecified: Secondary | ICD-10-CM | POA: Diagnosis present

## 2023-06-15 DIAGNOSIS — I214 Non-ST elevation (NSTEMI) myocardial infarction: Secondary | ICD-10-CM | POA: Diagnosis not present

## 2023-06-15 DIAGNOSIS — I2510 Atherosclerotic heart disease of native coronary artery without angina pectoris: Secondary | ICD-10-CM | POA: Diagnosis not present

## 2023-06-15 DIAGNOSIS — N186 End stage renal disease: Secondary | ICD-10-CM | POA: Diagnosis not present

## 2023-06-15 DIAGNOSIS — I4729 Other ventricular tachycardia: Secondary | ICD-10-CM | POA: Diagnosis not present

## 2023-06-15 DIAGNOSIS — I5043 Acute on chronic combined systolic (congestive) and diastolic (congestive) heart failure: Secondary | ICD-10-CM

## 2023-06-15 HISTORY — PX: RIGHT/LEFT HEART CATH AND CORONARY ANGIOGRAPHY: CATH118266

## 2023-06-15 LAB — GLUCOSE, CAPILLARY
Glucose-Capillary: 114 mg/dL — ABNORMAL HIGH (ref 70–99)
Glucose-Capillary: 91 mg/dL (ref 70–99)
Glucose-Capillary: 122 mg/dL — ABNORMAL HIGH (ref 70–99)
Glucose-Capillary: 118 mg/dL — ABNORMAL HIGH (ref 70–99)
Glucose-Capillary: 115 mg/dL — ABNORMAL HIGH (ref 70–99)
Glucose-Capillary: 216 mg/dL — ABNORMAL HIGH (ref 70–99)

## 2023-06-15 LAB — POCT I-STAT 7, (LYTES, BLD GAS, ICA,H+H)
Acid-base deficit: 3 mmol/L — ABNORMAL HIGH (ref 0.0–2.0)
Acid-base deficit: 1 mmol/L (ref 0.0–2.0)
Bicarbonate: 22.9 mmol/L (ref 20.0–28.0)
Bicarbonate: 24.8 mmol/L (ref 20.0–28.0)
Calcium, Ion: 1.16 mmol/L (ref 1.15–1.40)
Calcium, Ion: 1.2 mmol/L (ref 1.15–1.40)
HCT: 23 % — ABNORMAL LOW (ref 36.0–46.0)
HCT: 25 % — ABNORMAL LOW (ref 36.0–46.0)
Hemoglobin: 7.8 g/dL — ABNORMAL LOW (ref 12.0–15.0)
Hemoglobin: 8.5 g/dL — ABNORMAL LOW (ref 12.0–15.0)
O2 Saturation: 93 %
O2 Saturation: 51 %
Potassium: 4.3 mmol/L (ref 3.5–5.1)
Potassium: 4.4 mmol/L (ref 3.5–5.1)
Sodium: 124 mmol/L — ABNORMAL LOW (ref 135–145)
Sodium: 123 mmol/L — ABNORMAL LOW (ref 135–145)
TCO2: 24 mmol/L (ref 22–32)
TCO2: 26 mmol/L (ref 22–32)
pCO2 arterial: 42.6 mmHg (ref 32–48)
pCO2 arterial: 47.6 mmHg (ref 32–48)
pH, Arterial: 7.338 — ABNORMAL LOW (ref 7.35–7.45)
pH, Arterial: 7.325 — ABNORMAL LOW (ref 7.35–7.45)
pO2, Arterial: 72 mmHg — ABNORMAL LOW (ref 83–108)
pO2, Arterial: 29 mmHg — CL (ref 83–108)

## 2023-06-15 LAB — BASIC METABOLIC PANEL
Anion gap: 14 (ref 5–15)
BUN: 72 mg/dL — ABNORMAL HIGH (ref 8–23)
CO2: 21 mmol/L — ABNORMAL LOW (ref 22–32)
Calcium: 9.3 mg/dL (ref 8.9–10.3)
Chloride: 90 mmol/L — ABNORMAL LOW (ref 98–111)
Creatinine, Ser: 4.91 mg/dL — ABNORMAL HIGH (ref 0.44–1.00)
GFR, Estimated: 8 mL/min — ABNORMAL LOW (ref >60)
Glucose, Bld: 134 mg/dL — ABNORMAL HIGH (ref 70–99)
Potassium: 4.1 mmol/L (ref 3.5–5.1)
Sodium: 125 mmol/L — ABNORMAL LOW (ref 135–145)

## 2023-06-15 LAB — TROPONIN I (HIGH SENSITIVITY): Troponin I (High Sensitivity): 180 ng/L (ref <18)

## 2023-06-15 LAB — HEPARIN LEVEL (UNFRACTIONATED): Heparin Unfractionated: 0.46 IU/mL (ref 0.30–0.70)

## 2023-06-15 LAB — CBC
HCT: 25.6 % — ABNORMAL LOW (ref 36.0–46.0)
Hemoglobin: 8 g/dL — ABNORMAL LOW (ref 12.0–15.0)
MCH: 30 pg (ref 26.0–34.0)
MCHC: 31.3 g/dL (ref 30.0–36.0)
MCV: 95.9 fL (ref 80.0–100.0)
Platelets: 147 10*3/uL — ABNORMAL LOW (ref 150–400)
RBC: 2.67 MIL/uL — ABNORMAL LOW (ref 3.87–5.11)
RDW: 13.2 % (ref 11.5–15.5)
WBC: 6.7 10*3/uL (ref 4.0–10.5)
nRBC: 0 % (ref 0.0–0.2)

## 2023-06-15 LAB — IRON AND TIBC
Iron: 69 ug/dL (ref 28–170)
Saturation Ratios: 24 % (ref 10.4–31.8)
TIBC: 287 ug/dL (ref 250–450)
UIBC: 218 ug/dL

## 2023-06-15 LAB — FERRITIN: Ferritin: 152 ng/mL (ref 11–307)

## 2023-06-15 SURGERY — RIGHT/LEFT HEART CATH AND CORONARY ANGIOGRAPHY
Anesthesia: Moderate Sedation

## 2023-06-15 MED ORDER — METOPROLOL SUCCINATE ER 25 MG PO TB24: 25.0000 mg | ORAL_TABLET | Freq: Every day | ORAL | Status: DC

## 2023-06-15 MED ORDER — HEPARIN (PORCINE) IN NACL 2000-0.9 UNIT/L-% IV SOLN
INTRAVENOUS | Status: DC | PRN
  Administered 2023-06-15: 1000 mL

## 2023-06-15 MED ORDER — SODIUM CHLORIDE 0.9% FLUSH: 3.0000 mL | INTRAVENOUS | Status: DC | PRN

## 2023-06-15 MED ORDER — FENTANYL CITRATE (PF) 100 MCG/2ML IJ SOLN
INTRAMUSCULAR | Status: DC | PRN
  Administered 2023-06-15: 25 ug via INTRAVENOUS

## 2023-06-15 MED ORDER — SODIUM CHLORIDE 0.9% FLUSH
3.0000 mL | Freq: Two times a day (BID) | INTRAVENOUS | Status: DC
  Administered 2023-06-15 – 2023-06-16 (×2): 3 mL via INTRAVENOUS

## 2023-06-15 MED ORDER — FENTANYL CITRATE (PF) 100 MCG/2ML IJ SOLN
INTRAMUSCULAR | Status: AC
  Filled 2023-06-15: qty 2

## 2023-06-15 MED ORDER — HEPARIN (PORCINE) IN NACL 1000-0.9 UT/500ML-% IV SOLN
INTRAVENOUS | Status: AC
  Filled 2023-06-15: qty 1000

## 2023-06-15 MED ORDER — FUROSEMIDE 20 MG PO TABS
80.0000 mg | ORAL_TABLET | Freq: Two times a day (BID) | ORAL | Status: DC
  Administered 2023-06-15 – 2023-06-16 (×2): 80 mg via ORAL
  Filled 2023-06-15 (×2): qty 4

## 2023-06-15 MED ORDER — METHOCARBAMOL 500 MG PO TABS
500.0000 mg | ORAL_TABLET | Freq: Three times a day (TID) | ORAL | Status: DC | PRN
  Administered 2023-06-15 – 2023-06-16 (×2): 500 mg via ORAL
  Filled 2023-06-15 (×3): qty 1

## 2023-06-15 MED ORDER — MIDAZOLAM HCL 2 MG/2ML IJ SOLN
INTRAMUSCULAR | Status: DC | PRN
  Administered 2023-06-15: 1 mg via INTRAVENOUS

## 2023-06-15 MED ORDER — SODIUM CHLORIDE 0.9% FLUSH
3.0000 mL | Freq: Two times a day (BID) | INTRAVENOUS | Status: DC
  Administered 2023-06-15 – 2023-06-16 (×3): 3 mL via INTRAVENOUS

## 2023-06-15 MED ORDER — ASPIRIN 81 MG PO CHEW: 81.0000 mg | CHEWABLE_TABLET | ORAL | Status: DC

## 2023-06-15 MED ORDER — ATORVASTATIN CALCIUM 80 MG PO TABS: 80.0000 mg | ORAL_TABLET | Freq: Every day | ORAL | Status: DC

## 2023-06-15 MED ORDER — ASPIRIN 81 MG PO CHEW
CHEWABLE_TABLET | ORAL | Status: AC
  Filled 2023-06-15: qty 1

## 2023-06-15 MED ORDER — SODIUM CHLORIDE 0.9 % IV SOLN: 250.0000 mL | INTRAVENOUS | Status: DC | PRN

## 2023-06-15 MED ORDER — CEPHALEXIN 500 MG PO CAPS: 500.0000 mg | ORAL_CAPSULE | Freq: Two times a day (BID) | ORAL | 0 refills | Status: DC

## 2023-06-15 MED ORDER — HEPARIN (PORCINE) 25000 UT/250ML-% IV SOLN
1100.0000 [IU]/h | INTRAVENOUS | Status: DC
  Administered 2023-06-15 (×2): 1100 [IU]/h via INTRAVENOUS
  Filled 2023-06-15: qty 250

## 2023-06-15 MED ORDER — SODIUM CHLORIDE 0.9 % IV SOLN: INTRAVENOUS | Status: DC

## 2023-06-15 MED ORDER — ASPIRIN 81 MG PO TBEC: 81.0000 mg | DELAYED_RELEASE_TABLET | Freq: Every day | ORAL | 12 refills | Status: DC

## 2023-06-15 MED ORDER — IOHEXOL 300 MG/ML  SOLN
INTRAMUSCULAR | Status: DC | PRN
  Administered 2023-06-15: 58 mL

## 2023-06-15 MED ORDER — MIDAZOLAM HCL 2 MG/2ML IJ SOLN
INTRAMUSCULAR | Status: AC
  Filled 2023-06-15: qty 2

## 2023-06-15 SURGICAL SUPPLY — 11 items
CATH INFINITI 5FR MULTPACK ANG (CATHETERS) IMPLANT
CATH SWAN GANZ 7F STRAIGHT (CATHETERS) IMPLANT
DEVICE CLOSURE MYNXGRIP 5F (Vascular Products) IMPLANT
KIT MICROPUNCTURE NIT STIFF (SHEATH) IMPLANT
PACK CARDIAC CATH (CUSTOM PROCEDURE TRAY) ×2 IMPLANT
PROTECTION STATION PRESSURIZED (MISCELLANEOUS) ×1
SET ATX-X65L (MISCELLANEOUS) IMPLANT
SHEATH AVANTI 5FR X 11CM (SHEATH) IMPLANT
SHEATH AVANTI 7FRX11 (SHEATH) IMPLANT
STATION PROTECTION PRESSURIZED (MISCELLANEOUS) IMPLANT
WIRE GUIDERIGHT .035X150 (WIRE) IMPLANT

## 2023-06-15 NOTE — Progress Notes (Signed)
Progress Note  Patient Name: Cassandra Baker Date of Encounter: 06/15/2023  Primary Cardiologist: Mariah Milling  Subjective   Right and left cardiac catheterization was done today via the left femoral artery and vein.  Right heart cath showed mildly elevated filling pressures, mild pulmonary hypertension and normal cardiac output with no hemodynamic evidence of significant mitral regurgitation. Coronary angiography showed severe three-vessel coronary artery disease but the culprit appears to be subtotal occlusion of the right coronary artery which is severely calcified and disease in the midsegment as well as complex bifurcation disease at the PDA/posterior AV groove. I had a long discussion with the patient and her son at the bedside.  The patient lives by herself and she continues to be independent.  She drives and takes care of her grocery shopping.  Occasional confusion but no history of dementia.  Inpatient Medications    Scheduled Meds:  aspirin EC  81 mg Oral Daily   atorvastatin  80 mg Oral Daily   calcium acetate  1,334 mg Oral TID WC   cephALEXin  500 mg Oral Q12H   Chlorhexidine Gluconate Cloth  6 each Topical Q0600   FLUoxetine  10 mg Oral Once per day on Mon Wed Fri   furosemide  40 mg Intravenous BID   insulin aspart  0-6 Units Subcutaneous TID WC   levothyroxine  75 mcg Oral QAC breakfast   metoprolol succinate  25 mg Oral Daily   multivitamin with minerals  1 tablet Oral Daily   sodium bicarbonate  650 mg Oral BID   sodium chloride flush  3 mL Intravenous Q12H   Continuous Infusions:  anticoagulant sodium citrate     heparin Stopped (06/15/23 0739)   PRN Meds: acetaminophen **OR** acetaminophen, ALPRAZolam, alteplase, anticoagulant sodium citrate, busPIRone, heparin, lidocaine (PF), lidocaine-prilocaine, ondansetron **OR** ondansetron (ZOFRAN) IV, pentafluoroprop-tetrafluoroeth   Vital Signs    Vitals:   06/15/23 1038 06/15/23 1045 06/15/23 1100 06/15/23 1115  BP:  134/71 (!) 141/76 (!) 148/83 (!) 151/83  Pulse:  72 77 80  Resp:  13 14 17   Temp:      TempSrc:      SpO2: 96% 96% 95% 94%  Weight:      Height:        Intake/Output Summary (Last 24 hours) at 06/15/2023 1152 Last data filed at 06/15/2023 0700 Gross per 24 hour  Intake 367.61 ml  Output --  Net 367.61 ml    Filed Weights   06/13/23 1300 06/14/23 0215 06/15/23 0321  Weight: 67.5 kg 67.6 kg 67.8 kg    Telemetry    SR with PACs and PVCs, rare ventricular couplet - Personally Reviewed  ECG    No new tracings - Personally Reviewed  Physical Exam   GEN: No acute distress.   Neck: No JVD. Cardiac: RRR, no murmurs, rubs, or gallops.  Respiratory: Faint crackles along the bases bilaterally.  GI: Soft, nontender, non-distended.   MS: No edema; No deformity. Neuro:  Alert and oriented x 3; Nonfocal.  Psych: Normal affect.  Labs    Chemistry Recent Labs  Lab 06/11/23 1322 06/12/23 0413 06/13/23 0433 06/15/23 0413 06/15/23 1001 06/15/23 1002  NA 131* 133* 133* 125* 124* 123*  K 5.1 4.7 4.8 4.1 4.3 4.4  CL 95* 100 96* 90*  --   --   CO2 21* 20* 24 21*  --   --   GLUCOSE 146* 108* 132* 134*  --   --   BUN 97* 106* 70* 72*  --   --  CREATININE 6.45* 6.42* 4.68* 4.91*  --   --   CALCIUM 9.7 9.3 9.4 9.3  --   --   PROT 6.6  --   --   --   --   --   ALBUMIN 3.7  --  3.4*  --   --   --   AST 15  --   --   --   --   --   ALT 28  --   --   --   --   --   ALKPHOS 106  --   --   --   --   --   BILITOT 0.6  --   --   --   --   --   GFRNONAA 6* 6* 9* 8*  --   --   ANIONGAP 15 13 13 14   --   --       Hematology Recent Labs  Lab 06/13/23 0433 06/14/23 0621 06/15/23 0413 06/15/23 1001 06/15/23 1002  WBC 8.5 9.4 6.7  --   --   RBC 2.87* 2.77* 2.67*  --   --   HGB 8.7* 8.4* 8.0* 7.8* 8.5*  HCT 27.6* 26.5* 25.6* 23.0* 25.0*  MCV 96.2 95.7 95.9  --   --   MCH 30.3 30.3 30.0  --   --   MCHC 31.5 31.7 31.3  --   --   RDW 13.1 13.2 13.2  --   --   PLT 175 168 147*   --   --      Cardiac EnzymesNo results for input(s): "TROPONINI" in the last 168 hours. No results for input(s): "TROPIPOC" in the last 168 hours.   BNP Recent Labs  Lab 06/11/23 1322  BNP 2,338.2*      DDimer No results for input(s): "DDIMER" in the last 168 hours.   Radiology      Cardiac Studies   2D echo 06/04/2023: 1. Left ventricular ejection fraction, by estimation, is 30 to 35%. The  left ventricle has moderately decreased function. The left ventricle  demonstrates mild global hypokinesis with severe hypokinesis of the  inferior/posterior/septal wall. There is  mild left ventricular hypertrophy. Left ventricular diastolic parameters  are consistent with Grade I diastolic dysfunction (impaired relaxation).   2. Right ventricular systolic function is normal. The right ventricular  size is normal.   3. The mitral valve is normal in structure. Moderate mitral valve  regurgitation. No evidence of mitral stenosis. Moderate mitral annular  calcification.   4. The aortic valve is normal in structure. Aortic valve regurgitation is  not visualized. No aortic stenosis is present. Aortic valve mean gradient  measures 8.0 mmHg.   5. The inferior vena cava is normal in size with greater than 50%  respiratory variability, suggesting right atrial pressure of 3 mmHg.  __________   Eugenie Birks MPI 06/05/2023: Pharmacological myocardial perfusion imaging study with no significant  ischemia Large fixed defect in the inferior and inferolateral wall consistent with prior MI Hypokinesis of the inferior and inferolateral wall, EF estimated at 31% EKG changes concerning for ischemia at peak stress/recovery (diffuse ST elevations in the inferior and anterolateral leads V3 through V6. CT attenuation correction images with mild diffuse aortic atherosclerosis, coronary calcification, predominantly in the RCA  High risk scan in the setting of large infarct  Patient Profile     82 y.o. female  with history of CAD with recent NSTEMI medically managed as outlined below, chronic combined systolic and diastolic CHF,  ESRD, anemia of chronic disease, DM2, HTN, hypothyroidism, depression, asthma, and OA who was recently admitted to the hospital earlier this month with acute combined CHF and NSTEMI and left AMA who was readmitted with volume overload and who is being seen today for the evaluation of torsades de pointes and ventricular bigeminy at the request of Dr. Myriam Forehand.   Assessment & Plan    1. Polymorphic VT: -Felt to be ischemic in etiology -Developed on 6/22 at 0920 lasting for 13.5 seconds followed by ventricular bigeminy -Symptomatic with "blacking out" -Currently in sinus rhythm and asymptomatic -Potassium 4.8, magnesium 2.5 at time of episode -Frequent nonsustained ventricular tachycardia noted during cardiac catheterization today. Recommend EP consultation.   2.  CAD with recent NSTEMI: -Admitted to the hospital earlier this month with NSTEMI with LHC being deferred secondary to completion of MI, lack of angina, and acute on chronic renal dysfunction not on hemodialysis at that time -ASA and atorvastatin. Cardiac catheterization showed severe three-vessel coronary artery disease.  I discussed the findings with the patient and her son.  Recommend transfer to [facility] for CABG evaluation but she might be a marginal surgical candidate. If she is deemed to be not a surgical candidate, high risk PCI of the right coronary artery likely with intravascular lithotripsy plus minus LAD PCI, can be considered. For now, resume heparin drip later today. If PCI is planned, she will need to be loaded with clopidogrel and preferably transfused to get her hemoglobin above 9.   3. HFrEF: -Her wedge pressure was 18 mmHg.  I switched her to oral furosemide. -Toprol-XL -Not currently on ACE inhibitor/ARB/ARNI/MRA/SGLT2 inhibitor secondary to renal dysfunction -Ischemic evaluation as  outlined above -CHF education   4. ESRD on HD: -Hemodialysis per nephrology   5.  Anemia of chronic disease: -Overall relatively stable -Monitor -Ongoing management per IM and nephrology   6.  Hypothyroidism: -TSH normal earlier this month -On replacement therapy  7.  Muscle twitching and involuntarily leg movements: The exact etiology of this is not entirely clear.  This made the cardiac cath procedure somewhat difficult from the femoral access.  Total encounter time more than 50 minutes. Greater than 50% was spent in counseling and coordination of care with the patient.   For questions or updates, please contact [facility] Please consult [email or web address] for contact info under Cardiology/STEMI.    Signed, Lorine Bears, MD [facility] 06/15/2023, 11:52 AM

## 2023-06-15 NOTE — Discharge Summary (Addendum)
Physician Discharge Summary   Patient: Cassandra Baker MRN: [id number] DOB: January 10, 1941  Admit date:     06/11/2023  Discharge date: 06/15/23  Discharge Physician: Lurene Shadow   PCP: Renford Dills, MD   Recommendations at discharge:   Follow-up with Dr. Excell Seltzer, cardiologist, at [facility] within 24 hours of discharge  Discharge Diagnoses: Principal Problem:   ESRD (end stage renal disease) (HCC) Active Problems:   Non-ST elevation (NSTEMI) myocardial infarction (HCC)   Acute on chronic combined systolic and diastolic CHF (congestive heart failure) (HCC)   Acute systolic CHF (congestive heart failure) (HCC)   CAD (coronary artery disease)   Type 2 diabetes mellitus (HCC)   Hypothyroidism   Essential hypertension   Anxiety and depression   Pyuria   DCM (dilated cardiomyopathy) (HCC)   E. coli UTI   Polymorphic ventricular tachycardia (HCC)  Resolved Problems:   * No resolved hospital problems. [facility] Course:  Cassandra Baker is a 82 y.o. 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  She was recently admitted from 06/04/2023 through 06/07/2023 for NSTEMI complicated by acute on chronic systolic CHF, AKI on CKD stage IV.  At that time, she was treated with IV heparin drip and stress test showed old fixed defect.  Left heart catheter was deferred because of CKD.  Plan was to start patient on hemodialysis but she left AGAINST MEDICAL ADVICE.  She presented to the hospital again on 06/11/2023  bilateral leg swelling, fatigue, general weakness and shortness of breath.  She said she had been taking Lasix twice a day at home but her symptoms did not get any better.  She felt anxious and jittery.  She presented to the ED for further management.  She was admitted to the hospital for acute on chronic systolic CHF  and ESRD.  She was treated with IV Lasix.  Right femoral temporary dialysis catheter was placed by vascular surgeon on 06/12/2023 and hemodialysis was initiated on the same day.   Assessment and Plan:   ESRD (end stage renal disease) (HCC) Hemodialysis was initiated on 06/12/2023.  Plan for hemodialysis today prior to discharge.    Acute on chronic systolic CHF (congestive heart failure) (HCC) IV Lasix has been converted to oral Lasix.     Arrhythmia/ polymorphic ventricular tachycardia and ventricular bigeminy Episode was very brief but symptomatic (lightheadedness and "blacked out") around 9:20 AM on 06/13/2023.[name] Kitchen Normal QTc.  Continue metoprolol succinate     CAD (coronary artery disease) Recently admitted for NSTEMI and stress test at that time demonstrated fixed ischemic defect.   S/p left heart cath which showed severe three-vessel CAD.  Cardiologist recommended transfer to [facility] for evaluation for CABG Continue aspirin and metoprolol     Type 2 diabetes mellitus (HCC) Resume Januvia at discharge     Acute E. coli UTI Continue Keflex through 06/16/2023     Other comorbidities include anemia of chronic disease, hypothyroidism, hypertension, anxiety, depression     Discharge plan was discussed with her son and granddaughter at the bedside.  They are agreeable with the plan.         Consultants: Cardiologist Procedures performed: Cardiac catheterization Placement of right femoral temporary dialysis catheter on 06/12/2023  Disposition:  Transfer to [facility] Diet recommendation:  Discharge Diet Orders (From admission, onward)  Start     Ordered   06/15/23 0000  Diet - low sodium heart healthy        06/15/23 1449           Renal diet DISCHARGE MEDICATION: Allergies as of 06/15/2023       Reactions   Plum Pulp Anaphylaxis, Cough   Citrus    Coughing, chest tightness    Other Itching   Dairy products/Soaps & detergents    Penicillins Rash   Tolerates ceftriaxone        Medication List     TAKE these medications    acetaminophen 500 MG tablet Commonly known as: TYLENOL Take 500-1,000 mg by mouth every 6 (six) hours as needed (pain).   allopurinol 100 MG tablet Commonly known as: ZYLOPRIM Take 100 mg by mouth daily as needed.   aspirin EC 81 MG tablet Take 1 tablet (81 mg total) by mouth daily. Swallow whole. Start taking on: June 16, 2023   atorvastatin 80 MG tablet Commonly known as: LIPITOR Take 1 tablet (80 mg total) by mouth daily. Start taking on: June 16, 2023   calcitRIOL 0.5 MCG capsule Commonly known as: ROCALTROL Take 0.5 mcg by mouth daily.   calcium acetate 667 MG tablet Commonly known as: PHOSLO Take 667 mg by mouth 3 (three) times daily.   cephALEXin 500 MG capsule Commonly known as: KEFLEX Take 1 capsule (500 mg total) by mouth every 12 (twelve) hours for 1 day.   FLUoxetine 10 MG capsule Commonly known as: PROZAC Take 10 mg by mouth 3 (three) times a week. At night   furosemide 40 MG tablet Commonly known as: LASIX Take 80 mg by mouth 2 (two) times daily.   hydrOXYzine 10 MG tablet Commonly known as: ATARAX Take 10 mg by mouth at bedtime as needed for itching.   levothyroxine 75 MCG tablet Commonly known as: SYNTHROID Take 75 mcg by mouth daily before breakfast.   loratadine 5 MG chewable tablet Commonly known as: CLARITIN Chew 5 mg by mouth daily as needed for allergies.   metoprolol succinate 25 MG 24 hr tablet Commonly known as: TOPROL-XL Take 1 tablet (25 mg total) by mouth daily. Take with or immediately following a meal. Start taking on: June 16, 2023   sitaGLIPtin 25 MG tablet Commonly known as: JANUVIA Take 25 mg by mouth daily.   sodium bicarbonate 650 MG tablet Take 650 mg by mouth 2 (two) times daily.        Discharge Exam: Filed Weights   06/14/23 0215 06/15/23 0321 06/15/23 1623  Weight: 67.6 kg 67.8 kg 66.7 kg   GEN:  NAD SKIN: Warm and dry EYES: No acute abnormality ENT: MMM CV: RRR PULM: CTA B ABD: soft, ND, NT, +BS CNS: Sleepy but arousable and communicative, non focal EXT: Trace bilateral leg edema, no erythema or tenderness   Condition at discharge: good  The results of significant diagnostics from this hospitalization (including imaging, microbiology, ancillary and laboratory) are listed below for reference.   Imaging Studies: CARDIAC CATHETERIZATION  Result Date: 06/15/2023   Mid LAD lesion is 80% stenosed.   Mid Cx to Dist Cx lesion is 90% stenosed.   Ramus lesion is 60% stenosed.   Prox RCA to Mid RCA lesion is 95% stenosed.   Mid RCA to Dist RCA lesion is 60% stenosed.   RPDA lesion is 90% stenosed.   RPAV lesion is 80% stenosed.   There is moderate to severe left ventricular systolic dysfunction.  LV end diastolic pressure is mildly elevated.   The left ventricular ejection fraction is 25-35% by visual estimate. 1.  Severe three-vessel coronary artery disease.  The culprit for myocardial infarction is likely subtotal occlusion of the right coronary artery which is heavily calcified and diffusely diseased in the proximal/mid segment.  In addition, there is complex bifurcation disease at the right PDA and right posterior AV groove artery. 2.  Mildly with severely reduced LV systolic function with an EF of 30% with severe inferior and apical hypokinesis. 3.  Right heart catheterization showed mildly elevated filling pressures, mild pulmonary hypertension and normal cardiac output.  No significant V waves noted on wedge tracing. Recommendations: Difficult revascularization options.  The patient has surgical disease but she might not be a candidate for CABG.  The RCA is the culprit but it is diffusely diseased and heavily calcified.  Will discuss with patient and family and consider transfer to [facility] for evaluation.  If we decide to PCI, it is preferable to transfuse her to get her  hemoglobin above 9 and load with an antiplatelet medication.   PERIPHERAL VASCULAR CATHETERIZATION  Result Date: 06/12/2023 See surgical note for result.  DG Chest 2 View  Result Date: 06/11/2023 CLINICAL DATA:  SOB, eval edema, new onset ESRD EXAM: CHEST - 2 VIEW COMPARISON:  June 04, 2023 FINDINGS: The cardiomediastinal silhouette is unchanged in contour.Mild bronchial cuffing with interstitial prominence. No pleural effusion. No pneumothorax. Visualized abdomen is unremarkable. Multilevel degenerative changes of the thoracic spine. IMPRESSION: Constellation of findings are favored to reflect mild underlying pulmonary edema. Electronically Signed   By: Meda Klinefelter M.D.   On: 06/11/2023 16:22   NM Myocar Multi W/Spect W/Wall Motion / EF  Result Date: 06/05/2023 Pharmacological myocardial perfusion imaging study with no significant  ischemia Large fixed defect in the inferior and inferolateral wall consistent with prior MI Hypokinesis of the inferior and inferolateral wall, EF estimated at 31% EKG changes concerning for ischemia at peak stress/recovery (diffuse ST elevations in the inferior and anterolateral leads V3 through V6. CT attenuation correction images with mild diffuse aortic atherosclerosis, coronary calcification, predominantly in the RCA High risk scan in the setting of large infarct Signed, Dossie Arbour, MD, Ph.D [facility]   US RENAL  Result Date: 06/05/2023 CLINICAL DATA:  Acute renal injury EXAM: RENAL / URINARY TRACT ULTRASOUND COMPLETE COMPARISON:  None Available. FINDINGS: Right Kidney: Length = 7.7 cm Renal cortical thinning. Diffusely increased cortical echogenicity with preserved corticomedullary differentiation which can be seen with medical renal disease. No urinary tract dilation or shadowing calculi. The ureter is not seen. Left Kidney: Length = 8.3 cm Renal cortical thinning. Diffusely increased cortical echogenicity with diminished corticomedullary  differentiation which can be seen with medical renal disease. Interpolar simple-appearing cyst measures 10 mm. No urinary tract dilation or shadowing calculi. The ureter is not seen. Bladder: Appears normal for degree of bladder distention. Other: None. IMPRESSION: 1. No urinary tract dilation or shadowing calculi. 2. Bilateral renal cortical thinning and increased cortical echogenicity which can be seen with medical renal disease. Electronically Signed   By: Agustin Cree M.D.   On: 06/05/2023 09:56   ECHOCARDIOGRAM COMPLETE  Result Date: 06/04/2023    ECHOCARDIOGRAM REPORT   Patient Name:   AUDRIE KURI Date of Exam: 06/04/2023 Medical Rec #:  [id number]      Height:       64.0 in Accession #:    [id number]     Weight:  148.0 lb Date of Birth:  05-Sep-1941      BSA:          1.721 m Patient Age:    81 years       BP:           124/74 mmHg Patient Gender: F              HR:           97 bpm. Exam Location:  [facility] Procedure: 2D Echo, Cardiac Doppler, Color Doppler and Strain Analysis Indications:     CHF, NSTEMI  History:         Patient has no prior history of Echocardiogram examinations.                  CHF, Acute MI; Risk Factors:Diabetes. CKD.  Sonographer:     Mikki Harbor Referring Phys:  [id number] JAN A MANSY Diagnosing Phys: Julien Nordmann MD  Sonographer Comments: Global longitudinal strain was attempted. IMPRESSIONS  1. Left ventricular ejection fraction, by estimation, is 30 to 35%. The left ventricle has moderately decreased function. The left ventricle demonstrates mild global hypokinesis with severe hypokinesis of the inferior/posterior/septal wall. There is mild left ventricular hypertrophy. Left ventricular diastolic parameters are consistent with Grade I diastolic dysfunction (impaired relaxation).  2. Right ventricular systolic function is normal. The right ventricular size is normal.  3. The mitral valve is normal in structure. Moderate mitral valve regurgitation. No evidence of mitral  stenosis. Moderate mitral annular calcification.  4. The aortic valve is normal in structure. Aortic valve regurgitation is not visualized. No aortic stenosis is present. Aortic valve mean gradient measures 8.0 mmHg.  5. The inferior vena cava is normal in size with greater than 50% respiratory variability, suggesting right atrial pressure of 3 mmHg. FINDINGS  Left Ventricle: Left ventricular ejection fraction, by estimation, is 30 to 35%. The left ventricle has moderately decreased function. The left ventricle demonstrates global hypokinesis. The average left ventricular global longitudinal strain is -4.4 %.  The left ventricular internal cavity size was normal in size. There is mild left ventricular hypertrophy. Left ventricular diastolic parameters are consistent with Grade I diastolic dysfunction (impaired relaxation). Right Ventricle: The right ventricular size is normal. No increase in right ventricular wall thickness. Right ventricular systolic function is normal. Left Atrium: Left atrial size was normal in size. Right Atrium: Right atrial size was normal in size. Pericardium: There is no evidence of pericardial effusion. Mitral Valve: The mitral valve is normal in structure. Moderate mitral annular calcification. Moderate mitral valve regurgitation. No evidence of mitral valve stenosis. MV peak gradient, 5.0 mmHg. The mean mitral valve gradient is 2.0 mmHg. Tricuspid Valve: The tricuspid valve is normal in structure. Tricuspid valve regurgitation is not demonstrated. No evidence of tricuspid stenosis. Aortic Valve: The aortic valve is normal in structure. Aortic valve regurgitation is not visualized. No aortic stenosis is present. Aortic valve mean gradient measures 8.0 mmHg. Aortic valve peak gradient measures 13.1 mmHg. Aortic valve area, by VTI measures 1.57 cm. Pulmonic Valve: The pulmonic valve was normal in structure. Pulmonic valve regurgitation is not visualized. No evidence of pulmonic stenosis.  Aorta: The aortic root is normal in size and structure. Venous: The inferior vena cava is normal in size with greater than 50% respiratory variability, suggesting right atrial pressure of 3 mmHg. IAS/Shunts: The interatrial septum appears to be lipomatous. No atrial level shunt detected by color flow Doppler.  LEFT VENTRICLE PLAX 2D LVIDd:  4.90 cm     Diastology LVIDs:         4.20 cm     LV e' medial:    5.44 cm/s LV PW:         1.50 cm     LV E/e' medial:  21.1 LV IVS:        1.10 cm     LV e' lateral:   6.96 cm/s LVOT diam:     2.00 cm     LV E/e' lateral: 16.5 LV SV:         63 LV SV Index:   37          2D Longitudinal Strain LVOT Area:     3.14 cm    2D Strain GLS Avg:     -4.4 %  LV Volumes (MOD) LV vol d, MOD A2C: 85.3 ml LV vol d, MOD A4C: 73.8 ml LV vol s, MOD A2C: 48.3 ml LV vol s, MOD A4C: 37.4 ml LV SV MOD A2C:     37.0 ml LV SV MOD A4C:     73.8 ml LV SV MOD BP:      38.6 ml RIGHT VENTRICLE RV Basal diam:  3.25 cm RV Mid diam:    2.50 cm RV S prime:     13.10 cm/s TAPSE (M-mode): 2.7 cm LEFT ATRIUM             Index        RIGHT ATRIUM           Index LA diam:        3.90 cm 2.27 cm/m   RA Area:     12.70 cm LA Vol (A2C):   41.4 ml 24.05 ml/m  RA Volume:   31.80 ml  18.47 ml/m LA Vol (A4C):   58.4 ml 33.93 ml/m LA Biplane Vol: 49.8 ml 28.93 ml/m  AORTIC VALVE                     PULMONIC VALVE AV Area (Vmax):    1.69 cm      PV Vmax:       1.17 m/s AV Area (Vmean):   1.37 cm      PV Peak grad:  5.5 mmHg AV Area (VTI):     1.57 cm AV Vmax:           181.00 cm/s AV Vmean:          139.000 cm/s AV VTI:            0.405 m AV Peak Grad:      13.1 mmHg AV Mean Grad:      8.0 mmHg LVOT Vmax:         97.40 cm/s LVOT Vmean:        60.600 cm/s LVOT VTI:          0.202 m LVOT/AV VTI ratio: 0.50  AORTA Ao Root diam: 2.80 cm MITRAL VALVE MV Area (PHT): 5.16 cm       SHUNTS MV Area VTI:   2.11 cm       Systemic VTI:  0.20 m MV Peak grad:  5.0 mmHg       Systemic Diam: 2.00 cm MV Mean grad:  2.0  mmHg MV Vmax:       1.12 m/s MV Vmean:      63.1 cm/s MV Decel Time: 147 msec MR Peak grad:    129.0 mmHg MR Mean grad:  88.0 mmHg MR Vmax:         568.00 cm/s MR Vmean:        441.0 cm/s MR PISA:         1.01 cm MR PISA Eff ROA: 16 mm MR PISA Radius:  0.40 cm MV E velocity: 115.00 cm/s MV A velocity: 125.00 cm/s MV E/A ratio:  0.92 Julien Nordmann MD Electronically signed by Julien Nordmann MD Signature Date/Time: 06/04/2023/4:58:01 PM    Final    DG Chest 1 View  Result Date: 06/04/2023 CLINICAL DATA:  Shortness of breath EXAM: CHEST  1 VIEW COMPARISON:  03/11/2018 FINDINGS: There is hyperinflation of the lungs compatible with COPD. Increased markings in the lung bases. No effusions or pneumothorax. Heart mediastinal contours within normal limits. Aortic atherosclerosis. IMPRESSION: COPD. Increased markings in the lung bases could reflect atelectasis or scarring. Electronically Signed   By: Charlett Nose M.D.   On: 06/04/2023 00:34    Microbiology: Results for orders placed or performed during the hospital encounter of 06/11/23  Urine Culture     Status: Abnormal   Collection Time: 06/11/23  2:25 PM   Specimen: Urine, Clean Catch  Result Value Ref Range Status   Specimen Description   Final    URINE, CLEAN CATCH Performed at [facility], [address], [place], [place] [address]    Special Requests   Final    NONE Performed at [facility], [address] [place], [place] [address]    Culture 70,000 COLONIES/mL ESCHERICHIA COLI (A)  Final   Report Status 06/13/2023 FINAL  Final   Organism ID, Bacteria ESCHERICHIA COLI (A)  Final      Susceptibility   Escherichia coli - MIC*    AMPICILLIN >=32 RESISTANT Resistant     CEFAZOLIN <=4 SENSITIVE Sensitive     CEFEPIME <=0.12 SENSITIVE Sensitive     CEFTRIAXONE <=0.25 SENSITIVE Sensitive     CIPROFLOXACIN >=4 RESISTANT Resistant     GENTAMICIN <=1 SENSITIVE Sensitive     IMIPENEM <=0.25 SENSITIVE Sensitive      NITROFURANTOIN <=16 SENSITIVE Sensitive     TRIMETH/SULFA >=320 RESISTANT Resistant     AMPICILLIN/SULBACTAM 8 SENSITIVE Sensitive     PIP/TAZO <=4 SENSITIVE Sensitive     * 70,000 COLONIES/mL ESCHERICHIA COLI  MRSA Next Gen by PCR, Nasal     Status: None   Collection Time: 06/13/23  1:03 PM   Specimen: Nasal Mucosa; Nasal Swab  Result Value Ref Range Status   MRSA by PCR Next Gen NOT DETECTED NOT DETECTED Final    Comment: (NOTE) The GeneXpert MRSA Assay (FDA approved for NASAL specimens only), is one component of a comprehensive MRSA colonization surveillance program. It is not intended to diagnose MRSA infection nor to guide or monitor treatment for MRSA infections. Test performance is not FDA approved in patients less than 4 years old. Performed at [facility], [address] [place], [place] [address]     Labs: CBC: Recent Labs  Lab 06/11/23 1322 06/12/23 0413 06/13/23 0433 06/14/23 0621 06/15/23 0413 06/15/23 1001 06/15/23 1002  WBC 8.8 8.1 8.5 9.4 6.7  --   --   NEUTROABS  --   --  6.7  --   --   --   --   HGB 8.3* 7.8* 8.7* 8.4* 8.0* 7.8* 8.5*  HCT 26.7* 24.7* 27.6* 26.5* 25.6* 23.0* 25.0*  MCV 96.7 95.7 96.2 95.7 95.9  --   --   PLT 200 179 175  168 147*  --   --    Basic Metabolic Panel: Recent Labs  Lab 06/11/23 1322 06/12/23 0413 06/13/23 0433 06/15/23 0413 06/15/23 1001 06/15/23 1002  NA 131* 133* 133* 125* 124* 123*  K 5.1 4.7 4.8 4.1 4.3 4.4  CL 95* 100 96* 90*  --   --   CO2 21* 20* 24 21*  --   --   GLUCOSE 146* 108* 132* 134*  --   --   BUN 97* 106* 70* 72*  --   --   CREATININE 6.45* 6.42* 4.68* 4.91*  --   --   CALCIUM 9.7 9.3 9.4 9.3  --   --   MG  --   --  2.5*  --   --   --   PHOS  --   --  8.0*  --   --   --    Liver Function Tests: Recent Labs  Lab 06/11/23 1322 06/13/23 0433  AST 15  --   ALT 28  --   ALKPHOS 106  --   BILITOT 0.6  --   PROT 6.6  --   ALBUMIN 3.7 3.4*   CBG: Recent Labs  Lab 06/14/23 2113  06/15/23 0734 06/15/23 0802 06/15/23 0843 06/15/23 1042  GLUCAP 202* 114* 91 122* 118*    Discharge time spent: greater than 30 minutes.  Signed: Lurene Shadow, MD [facility] 06/15/2023

## 2023-06-15 NOTE — Progress Notes (Signed)
Hemodialysis note  Received patient in bed to unit. Alert and oriented.  Informed consent signed and in chart.  Treatment initiated: 1646 Treatment completed: 1756  Patient did not finish her treatment and signed the waiver. Dr. Wynelle Link is present and aware.   Transported back to room, alert without acute distress.  Report given to patient's RN.   Access used: Right Femoral HD Cathter Access issues: none  Total UF removed: 0 Medication(s) given:  none  Post HD weight: 66.7 kg   [facility]

## 2023-06-15 NOTE — H&P (Signed)
Cardiology Admission History and Physical   Patient ID: Cassandra Baker MRN: [id number]; DOB: July 12, 1941   Admission date: (Not on file)  PCP:  Cassandra Dills, MD   [facility] Providers Cardiologist:  Cassandra Nordmann, MD   {   Chief Complaint:  Leg swelling  Patient Profile:   Cassandra Baker is a 82 y.o. female with CAD with recent NSTEMI medically managed, chronic combined systolic and diastolic CHF, ESRD, anemia of chronic disease, DM2, HTN, hypothyroidism, depression, asthma and OA who is being seen 06/15/2023 for the evaluation of torsades de pointes and ventricular bigeminy.  History of Present Illness:   Cassandra Baker was previously evaluated by Cassandra Baker when he was with [facility] cardiology in [place] many years ago, though dose not remember why she was evaluated. She was admitted to the hospital earlier this month with increasing exertional dyspnea over the preceding year with occasional vague chest discomfort with symptoms becoming more frequent prompting her hospital admission. He was subsequently admitted with an NSTEMI and acute combined CHF cmoplicated by acute CKD. Echo during that admission showedLVEF 30-35%, global HK with severe KF of the inferior, posterior and septal wall, mild LVH, G1DD, normal RVSF, and ventricular size, moderate MR with moderate mitral annular calcification, and an estimated right atrial pressure of . Due to acute on chronic CKD, cardiac cath was nor pursued. She underwent Lexiscan MPI with an EF 31%. Overall, this was a high risk scan in the setting of large infarct. LHC continu to be deferred secondary to renal dysfunction. She was treated with 48 hours of IV heparin. Escalation of GDMT was limited by comorbidities. She left AMA on 6/16.   She returned to the hospital 06/11/23 with increasing lower extremity swelling with significant fatigue. She denies chest pain, palpitations or dyspnea. BNP remained elevated at 2338. She underwent HD  type cath placement with intiation of dilaysis 06/12/23. On the morning of 6/22 she developed torsades de pointes lasting 13.5 seconds followed by ventricular bigeminy. During th episode, the patient reported dizziness and blacking out. She denies chest pain or SOB. NO LOC.   The patient underwent R/L heart cath 6/24. Right heart cath showed mildly elevated filing pressures, mild pulmonary hypertension and normal cardiac output with no hemodynamic evidence of significant mitral regurgitation. LHC showed severe 3V CAD, culprit being subtotal occlusion of the RCA which is severely calcified in the mid-segment as well as complex bifurcation disease at the PDA/posterior AV groove. Plan for transfer to [facility] for CABG consult and EP consult.    Past Medical History:  Diagnosis Date   Anemia    Arthritis    low back,knees   Asthma    Basal cell carcinoma (BCC)    a. nose   CHF (congestive heart failure) (HCC)    a. prev eval by Cassandra Baker in [place] - "many yrs ago."   CKD (chronic kidney disease), stage IV (HCC)    Depression    Diabetes mellitus without complication (HCC)    Dyspnea    Hypertension    Hypothyroidism     Past Surgical History:  Procedure Laterality Date   APPENDECTOMY     4 th grade   AV FISTULA PLACEMENT Left 05/12/2019   Procedure: BRACHIOCEPHALIC ARTERIOVENOUS (AV) FISTULA CREATION LEFT ARM;  Surgeon: Cassandra Libman, MD;  Location: [facility] OR;  Service: Vascular;  Laterality: Left;   EYE SURGERY  2014   Bilateral 6 mos. apart  cataracts   FISTULA SUPERFICIALIZATION Left 10/06/2019  Procedure: ELEVATION OF ARTERIOVENOUS FISTULA LEFT ARM;  Surgeon: Cassandra Libman, MD;  Location: [facility] OR;  Service: Vascular;  Laterality: Left;   LYMPH NODE BIOPSY N/A 02/09/2018   Procedure: SENTINEL LYMPH NODE BIOPSY;  Surgeon: Cassandra Birchwood, MD;  Location: [facility] ORS;  Service: Gynecology;  Laterality: N/A;   PARATHYROIDECTOMY N/A 12/07/2014   Procedure: PARATHYROIDECTOMY;  Surgeon: Cassandra Level, MD;   Location: [facility] ORS;  Service: General;  Laterality: N/A;   ROBOTIC ASSISTED TOTAL HYSTERECTOMY WITH BILATERAL SALPINGO OOPHERECTOMY Bilateral 02/09/2018   Procedure: XI ROBOTIC ASSISTED TOTAL HYSTERECTOMY WITH BILATERAL SALPINGO OOPHORECTOMY;  Surgeon: Cassandra Birchwood, MD;  Location: [facility] ORS;  Service: Gynecology;  Laterality: Bilateral;   TEMPORARY DIALYSIS CATHETER N/A 06/12/2023   Procedure: TEMPORARY DIALYSIS CATHETER;  Surgeon: Cassandra Dills, MD;  Location: [facility] INVASIVE CV LAB;  Service: Cardiovascular;  Laterality: N/A;   THYROID LOBECTOMY Left 12/07/2014   Procedure: LEFT THYROID LOBECTOMY;  Surgeon: Cassandra Level, MD;  Location: [facility] ORS;  Service: General;  Laterality: Left;   TONSILLECTOMY     TUBAL LIGATION     Early thirties     Medications Prior to Admission: Prior to Admission medications   Medication Sig Start Date End Date Taking? Authorizing Provider  acetaminophen (TYLENOL) 500 MG tablet Take 500-1,000 mg by mouth every 6 (six) hours as needed (pain).     [provider]  allopurinol (ZYLOPRIM) 100 MG tablet Take 100 mg by mouth daily as needed.    [provider]  calcitRIOL (ROCALTROL) 0.5 MCG capsule Take 0.5 mcg by mouth daily.  04/01/19   [provider]  calcium acetate (PHOSLO) 667 MG tablet Take 667 mg by mouth 3 (three) times daily.    [provider]  FLUoxetine (PROZAC) 10 MG capsule Take 10 mg by mouth 3 (three) times a week. At night    [provider]  furosemide (LASIX) 40 MG tablet Take 80 mg by mouth 2 (two) times daily.     [provider]  hydrOXYzine (ATARAX/VISTARIL) 10 MG tablet Take 10 mg by mouth at bedtime as needed for itching.    [provider]  levothyroxine (SYNTHROID) 75 MCG tablet Take 75 mcg by mouth daily before breakfast.    [provider]  loratadine (CLARITIN) 5 MG chewable tablet Chew 5 mg by mouth daily as needed for allergies.    [provider]  sitaGLIPtin  (JANUVIA) 25 MG tablet Take 25 mg by mouth daily.    [provider]  sodium bicarbonate 650 MG tablet Take 650 mg by mouth 2 (two) times daily. 04/26/19   [provider]     Allergies:    Allergies  Allergen Reactions   Plum Pulp Anaphylaxis and Cough   Citrus     Coughing, chest tightness    Other Itching    Dairy products/Soaps & detergents   Penicillins Rash    Tolerates ceftriaxone    Social History:   Social History   Socioeconomic History   Marital status: Married    Spouse name: Not on file   Number of children: Not on file   Years of education: Not on file   Highest education Baker: Not on file  Occupational History   Not on file  Tobacco Use   Smoking status: Never   Smokeless tobacco: Never  Vaping Use   Vaping Use: Never used  Substance and Sexual Activity   Alcohol use: No   Drug use: No   Sexual activity: Not  Currently    Birth control/protection: Post-menopausal  Other Topics Concern   Not on file  Social History Narrative   Lives in [place].  Does not routinely exercise.   Social Determinants of Health   Financial Resource Strain: Not on file  Food Insecurity: No Food Insecurity (06/11/2023)   Hunger Vital Sign    Worried About Running Out of Food in the Last Year: Never true    Ran Out of Food in the Last Year: Never true  Transportation Needs: No Transportation Needs (06/11/2023)   PRAPARE - Administrator, Civil Service (Medical): No    Lack of Transportation (Non-Medical): No  Physical Activity: Not on file  Stress: Not on file  Social Connections: Not on file  Intimate Partner Violence: Unknown (06/11/2023)   Humiliation, Afraid, Rape, and Kick questionnaire    Fear of Current or Ex-Partner: Patient declined    Emotionally Abused: No    Physically Abused: No    Sexually Abused: No    Family History:   The patient's family history includes Cervical cancer in her maternal aunt; Heart disease in her brother,  brother, father, and mother; Hypertension in her father and mother; Stroke in her brother and brother.    ROS:  Please see the history of present illness.  All other ROS reviewed and negative.     Physical Exam/Data:  There were no vitals filed for this visit. No intake or output data in the 24 hours ending 06/15/23 1246    06/15/2023    3:21 AM 06/14/2023    2:15 AM 06/13/2023    1:00 PM  Last 3 Weights  Weight (lbs) 149 lb 7.6 oz 149 lb 0.5 oz 148 lb 13 oz  Weight (kg) 67.8 kg 67.6 kg 67.5 kg     There is no height or weight on file to calculate BMI.  General:  Well nourished, well developed, in no acute distress HEENT: normal Neck: no JVD Vascular: No carotid bruits; Distal pulses 2+ bilaterally   Cardiac:  normal S1, S2; RRR; no murmur  Lungs:  clear to auscultation bilaterally, no wheezing, rhonchi or rales  Abd: soft, nontender, no hepatomegaly  Ext: no edema Musculoskeletal:  No deformities, BUE and BLE strength normal and equal Skin: warm and dry  Neuro:  CNs 2-12 intact, no focal abnormalities noted Psych:  Normal affect    EKG:  The ECG that was done 06/13/23 was personally reviewed and demonstrates NSR 72bpm, TWI inf leads  Relevant CV Studies:  R/L heart cath 06/15/23 RIGHT/LEFT HEART CATH AND CORONARY ANGIOGRAPHY   Conclusion      Mid LAD lesion is 80% stenosed.   Mid Cx to Dist Cx lesion is 90% stenosed.   Ramus lesion is 60% stenosed.   Prox RCA to Mid RCA lesion is 95% stenosed.   Mid RCA to Dist RCA lesion is 60% stenosed.   RPDA lesion is 90% stenosed.   RPAV lesion is 80% stenosed.   There is moderate to severe left ventricular systolic dysfunction.   LV end diastolic pressure is mildly elevated.   The left ventricular ejection fraction is 25-35% by visual estimate.   1.  Severe three-vessel coronary artery disease.  The culprit for myocardial infarction is likely subtotal occlusion of the right coronary artery which is heavily calcified and  diffusely diseased in the proximal/mid segment.  In addition, there is complex bifurcation disease at the right PDA and right posterior AV groove artery. 2.  Mildly with  severely reduced LV systolic function with an EF of 30% with severe inferior and apical hypokinesis. 3.  Right heart catheterization showed mildly elevated filling pressures, mild pulmonary hypertension and normal cardiac output.  No significant V waves noted on wedge tracing.   Recommendations: Difficult revascularization options.  The patient has surgical disease but she might not be a candidate for CABG.  The RCA is the culprit but it is diffusely diseased and heavily calcified.  Will discuss with patient and family and consider transfer to [facility] for evaluation.  If we decide to PCI, it is preferable to transfuse her to get her hemoglobin above 9 and load with an antiplatelet medication.    2D echo 06/04/2023: 1. Left ventricular ejection fraction, by estimation, is 30 to 35%. The  left ventricle has moderately decreased function. The left ventricle  demonstrates mild global hypokinesis with severe hypokinesis of the  inferior/posterior/septal wall. There is  mild left ventricular hypertrophy. Left ventricular diastolic parameters  are consistent with Grade I diastolic dysfunction (impaired relaxation).   2. Right ventricular systolic function is normal. The right ventricular  size is normal.   3. The mitral valve is normal in structure. Moderate mitral valve  regurgitation. No evidence of mitral stenosis. Moderate mitral annular  calcification.   4. The aortic valve is normal in structure. Aortic valve regurgitation is  not visualized. No aortic stenosis is present. Aortic valve mean gradient  measures 8.0 mmHg.   5. The inferior vena cava is normal in size with greater than 50%  respiratory variability, suggesting right atrial pressure of 3 mmHg.  __________   Eugenie Birks MPI 06/05/2023: Pharmacological  myocardial perfusion imaging study with no significant  ischemia Large fixed defect in the inferior and inferolateral wall consistent with prior MI Hypokinesis of the inferior and inferolateral wall, EF estimated at 31% EKG changes concerning for ischemia at peak stress/recovery (diffuse ST elevations in the inferior and anterolateral leads V3 through V6. CT attenuation correction images with mild diffuse aortic atherosclerosis, coronary calcification, predominantly in the RCA  High risk scan in the setting of large infarct  Laboratory Data:  High Sensitivity Troponin:   Recent Labs  Lab 06/03/23 2353 06/04/23 0145 06/05/23 1421  TROPONINIHS 986* 998* 993*      Chemistry Recent Labs  Lab 06/13/23 0433 06/15/23 0413 06/15/23 1001 06/15/23 1002  NA 133* 125* 124* 123*  K 4.8 4.1 4.3 4.4  CL 96* 90*  --   --   CO2 24 21*  --   --   GLUCOSE 132* 134*  --   --   BUN 70* 72*  --   --   CREATININE 4.68* 4.91*  --   --   CALCIUM 9.4 9.3  --   --   MG 2.5*  --   --   --   GFRNONAA 9* 8*  --   --   ANIONGAP 13 14  --   --     Recent Labs  Lab 06/11/23 1322 06/13/23 0433  PROT 6.6  --   ALBUMIN 3.7 3.4*  AST 15  --   ALT 28  --   ALKPHOS 106  --   BILITOT 0.6  --    Lipids No results for input(s): "CHOL", "TRIG", "HDL", "LABVLDL", "LDLCALC", "CHOLHDL" in the last 168 hours. Hematology Recent Labs  Lab 06/14/23 0621 06/15/23 0413 06/15/23 1001 06/15/23 1002  WBC 9.4 6.7  --   --   RBC 2.77* 2.67*  --   --  HGB 8.4* 8.0* 7.8* 8.5*  HCT 26.5* 25.6* 23.0* 25.0*  MCV 95.7 95.9  --   --   MCH 30.3 30.0  --   --   MCHC 31.7 31.3  --   --   RDW 13.2 13.2  --   --   PLT 168 147*  --   --    Thyroid No results for input(s): "TSH", "FREET4" in the last 168 hours. BNP Recent Labs  Lab 06/11/23 1322  BNP 2,338.2*    DDimer No results for input(s): "DDIMER" in the last 168 hours.   Radiology/Studies:  CARDIAC CATHETERIZATION  Result Date: 06/15/2023   Mid LAD  lesion is 80% stenosed.   Mid Cx to Dist Cx lesion is 90% stenosed.   Ramus lesion is 60% stenosed.   Prox RCA to Mid RCA lesion is 95% stenosed.   Mid RCA to Dist RCA lesion is 60% stenosed.   RPDA lesion is 90% stenosed.   RPAV lesion is 80% stenosed.   There is moderate to severe left ventricular systolic dysfunction.   LV end diastolic pressure is mildly elevated.   The left ventricular ejection fraction is 25-35% by visual estimate. 1.  Severe three-vessel coronary artery disease.  The culprit for myocardial infarction is likely subtotal occlusion of the right coronary artery which is heavily calcified and diffusely diseased in the proximal/mid segment.  In addition, there is complex bifurcation disease at the right PDA and right posterior AV groove artery. 2.  Mildly with severely reduced LV systolic function with an EF of 30% with severe inferior and apical hypokinesis. 3.  Right heart catheterization showed mildly elevated filling pressures, mild pulmonary hypertension and normal cardiac output.  No significant V waves noted on wedge tracing. Recommendations: Difficult revascularization options.  The patient has surgical disease but she might not be a candidate for CABG.  The RCA is the culprit but it is diffusely diseased and heavily calcified.  Will discuss with patient and family and consider transfer to [facility] for evaluation.  If we decide to PCI, it is preferable to transfuse her to get her hemoglobin above 9 and load with an antiplatelet medication.     Assessment and Plan:   Polymorphic VT - developed 6/22  - felt to be ischemic in etiology - currently in NSR - K 4.8 and Mag 2.5 - Cath showed severe 3VCAD. Plan to transfer to [facility] for CABG evaluation - Plan for EP consult at [facility]  CAD with recent NSTEMI - admitted to the hospital earlier this month with LHC being deferred 2/2 to completion of MI, lack of angina, and acute CKD not on HD at that time - LHC this admission showed  severe 3V CAD, culprit appeared to be subtotal occlusion of the right coronary artery which is severely calcified and disease in the midsegment as well as complex bifurcation disease at the PDA/posterior AV groove - RHC showed mildly elevated filling pressures, mild pulmonary HTN and normal cardiac output with no hemodynamic evidence of significant MR - Plan to transfer patient to [facility] for CABG eval. If patient is not deemed a surgical candidate, high risk PCI of the RCA with intravascular lithotripsy plus minus LAD PCI, can be considered - resume IV heparin post-cath - If plan for PCI, she will need to be loaded with Plavix and transfused to get her Hgb above 9 - continue ASA, Toprol and Lipitor  HFrEF - Echo 05/2023 showed LVEF 30-35%, g1DD, with HK, moderate MR -  LVEF 30% on heart cath - wedge pressure - switched to lasix 80mg  BID - continue Toprol 25mg  daily - No ACEi/ARB/ARNI/MRA/SGLT2i secondary to renal dysfunction' - volume management per HD  ESRD on HD - HD per nephrology  Anemia of chronic disease - stable 8-9 - monitor per-operatively - may need transfusion as above  Hypothyroidism - continue synthroid  DM2 - continue SSI  Muscle twitching and involuntarily leg movements - etiology unclear - further work-up as outpatient  Acute UTI - continue Keflex 500mg  BID, last dose 6/26  Severity of Illness: The appropriate patient status for this patient is INPATIENT. Inpatient status is judged to be reasonable and necessary in order to provide the required intensity of service to ensure the patient's safety. The patient's presenting symptoms, physical exam findings, and initial radiographic and laboratory data in the context of their chronic comorbidities is felt to place them at high risk for further clinical deterioration. Furthermore, it is not anticipated that the patient will be medically stable for discharge from the hospital within 2 midnights of admission.   * I  certify that at the point of admission it is my clinical judgment that the patient will require inpatient hospital care spanning beyond 2 midnights from the point of admission due to high intensity of service, high risk for further deterioration and high frequency of surveillance required.*   For questions or updates, please contact [facility] Please consult [email or web address] for contact info under     Signed, Derrich Gaby David Stall, PA-C  06/15/2023 12:46 PM

## 2023-06-15 NOTE — Progress Notes (Signed)
ANTICOAGULATION CONSULT NOTE  Pharmacy Consult for heparin Indication: chest pain/ACS  Allergies  Allergen Reactions   Plum Pulp Anaphylaxis and Cough   Citrus     Coughing, chest tightness    Egg-Derived Products Itching    congestion   Other Itching    Dairy products/Soaps & detergents   Penicillins Rash    Tolerates ceftriaxone    Patient Measurements: Height: 5\' 4"  (162.6 cm) Weight: 67.8 kg (149 lb 7.6 oz) IBW/kg (Calculated) : 54.7 Heparin Dosing Weight: 66.1 kg  Vital Signs: Temp: 98.2 F (36.8 C) (06/24 0200) Temp Source: Oral (06/24 0200) BP: 125/64 (06/24 0505) Pulse Rate: 62 (06/24 0505)  Labs: Recent Labs    06/13/23 0433 06/13/23 2350 06/14/23 0621 06/14/23 0746 06/15/23 0413  HGB 8.7*  --  8.4*  --  8.0*  HCT 27.6*  --  26.5*  --  25.6*  PLT 175  --  168  --  147*  HEPARINUNFRC  --  0.48  --  0.51 0.46  CREATININE 4.68*  --   --   --   --      Estimated Creatinine Clearance: 8.8 mL/min (A) (by C-G formula based on SCr of 4.68 mg/dL (H)).   Medical History: Past Medical History:  Diagnosis Date   Anemia    Arthritis    low back,knees   Asthma    Basal cell carcinoma (BCC)    a. nose   CHF (congestive heart failure) (HCC)    a. prev eval by Dr. Katrinka Blazing in [place] - "many yrs ago."   CKD (chronic kidney disease), stage IV (HCC)    Depression    Diabetes mellitus without complication (HCC)    Dyspnea    Hypertension    Hypothyroidism     Medications:  Scheduled:   aspirin EC  81 mg Oral Daily   atorvastatin  80 mg Oral Daily   calcium acetate  1,334 mg Oral TID WC   cephALEXin  500 mg Oral Q12H   Chlorhexidine Gluconate Cloth  6 each Topical Q0600   FLUoxetine  10 mg Oral Once per day on Mon Wed Fri   furosemide  40 mg Intravenous BID   insulin aspart  0-6 Units Subcutaneous TID WC   levothyroxine  75 mcg Oral QAC breakfast   metoprolol succinate  25 mg Oral Daily   multivitamin with minerals  1 tablet Oral Daily   sodium  bicarbonate  650 mg Oral BID    Assessment: 82 yo female w/ PMH of  CAD with recent NSTEMI medically managed as outlined below, chronic combined systolic and diastolic CHF, ESRD, anemia of chronic disease, DM2, HTN, hypothyroidism, depression, asthma, and OA who was recently admitted to the hospital earlier this month with acute combined CHF and NSTEMI and left AMA who was readmitted with volume overload and who was seen by cardiology for the evaluation of torsades de pointes and ventricular bigeminy.    6/22 2350 HL 0.48  6/23 0746 HL 0.51.  6/24 0413 HL 0.46  Goal of Therapy:  Heparin level 0.3-0.7 units/ml Monitor platelets by anticoagulation protocol: Yes   Plan:  Heparin level is therapeutic.  Continue heparin infusion at 1100 units/hr.  Recheck heparin level and CBC with AM labs.   Barrie Folk, PharmD 06/15/2023,5:25 AM

## 2023-06-15 NOTE — Progress Notes (Addendum)
CROSS COVER NOTE  NAME: Cassandra Baker MRN: [id number] DOB : 1941/12/16    Concern as stated by nurse / staff   Hey I noticed the tele monitor alarming for ST elevation. I just got an EKG the last EKG I can find to compare it to is from the 22nd. Patient HR 75 BP 95/50 (64). No complaints of chest pain or signs of distress.   10:09 PM Was it signed out that patient is waiting for a transfer to [facility] for CABG? What is the status?  Please find out from [facility]. Thanks  10:33 PM Judyann Munson, RN I will I'm calling now last status was there was no bed.[name] KitchenMarland KitchenI just sent the trop.[name] KitchenMarland KitchenPatient is also adamant about not being transferred   10:40 PM [facility] per [facility]link we are still waiting on bed   10:45 PM She needs CABG and can't stay here .  10:59 PM Judyann Munson, RN Her BP is 100/46 (62) Her Map is staring to hover below 65... She got a dose of buspar at 2114. Also The son is now at bedside I have had a conversation with the son about the patient refusing transfer he did not sound confident about being able to talk the patient into transferring.    Pertinent findings on chart review: Today's cardiology note reviewed stating the following: CAD (coronary artery disease) Recently admitted for NSTEMI and stress test at that time demonstrated fixed ischemic defect.   S/p left heart cath which showed severe three-vessel CAD.  Cardiologist recommended transfer to [facility] for evaluation for CABG Continue aspirin and metoprolol  Discharge summary noted for transfer to [facility]     06/15/2023    9:30 PM 06/15/2023    8:30 PM 06/15/2023    8:00 PM  Vitals with BMI  Systolic 118 118 95  Diastolic 61 55 75  Pulse 71 76 76     Patient assessment Patient resting comfortably in bed, son at bedside and nursing room.  She is denying chest pain or shortness of breath.  When asked, she is able to explain to me why she is being transferred to [facility] and states that she is  50-50 on the decision to go along with the transfer     06/16/2023   12:00 AM 06/15/2023   11:03 PM 06/15/2023   10:33 PM  Vitals with BMI  Systolic 114 100 409  Diastolic 56 46 51  Pulse 75 67 69   Physical Exam Vitals and nursing note reviewed.  Constitutional:      General: She is not in acute distress. HENT:     Head: Normocephalic and atraumatic.  Cardiovascular:     Rate and Rhythm: Normal rate and regular rhythm.     Heart sounds: Normal heart sounds.  Pulmonary:     Effort: Pulmonary effort is normal.     Breath sounds: Normal breath sounds.  Abdominal:     Palpations: Abdomen is soft.     Tenderness: There is no abdominal tenderness.  Neurological:     Mental Status: Mental status is at baseline.      Assessment and  Interventions   Assessment:  -Severe triple-vessel CAD, currently awaiting transfer to [facility] for evaluation for CABG -Abnormal EKG, asymptomatic   Plan: Get serial troponins to evaluate need to expedite transfer Continue current CAD management X   Cardiac Panel (last 3 results) Recent Labs    06/15/23 2237 06/16/23 0013  TROPONINIHS 180* 209*   Assessment/Plan: Based on review of EKG, troponin 180> 209, patient is asymptomatic at this time, will continue to monitor while awaiting transfer.  I do not see a need for expedited transfer at this time   Extensive conversation with patient with son at bedside.  Nurse also present during the discussion which lasted at least 30 minutes patient expressed good understanding of what is going on with her and reason for transfer.  Explained the availability of cardiothoracic capabilities at [facility]  We advised that they will evaluate her and provide further recommendations and she will have the opportunity to decide on whether to go along with the procedure.  We informed her that the ambulance might be on its way tonight and we would like her to think about it.  Son states that he and his mother  will speak further about it.     CRITICAL CARE Performed by: Andris Baumann   Total critical care time: 60 minutes  Critical care time was exclusive of separately billable procedures and treating other patients.  Critical care was necessary to treat or prevent imminent or life-threatening deterioration.  Critical care was time spent personally by me on the following activities: development of treatment plan with patient and/or surrogate as well as nursing, discussions with consultants, evaluation of patient's response to treatment, examination of patient, obtaining history from patient or surrogate, ordering and performing treatments and interventions, ordering and review of laboratory studies, ordering and review of radiographic studies, pulse oximetry and re-evaluation of patient's condition.

## 2023-06-15 NOTE — Interval H&P Note (Signed)
History and Physical Interval Note:  06/15/2023 9:38 AM  Cassandra Baker  has presented today for surgery, with the diagnosis of chest pain.  The various methods of treatment have been discussed with the patient and family. After consideration of risks, benefits and other options for treatment, the patient has consented to  Procedure(s): RIGHT/LEFT HEART CATH AND CORONARY ANGIOGRAPHY (N/A) as a surgical intervention.  The patient's history has been reviewed, patient examined, no change in status, stable for surgery.  I have reviewed the patient's chart and labs.  Questions were answered to the patient's satisfaction.     Lorine Bears

## 2023-06-15 NOTE — Progress Notes (Signed)
Patient transported to hemodialysis unit on telemetry without complication. Heparin infusion restarted per orders.

## 2023-06-15 NOTE — Progress Notes (Signed)
[facility]  PROGRESS NOTE   Subjective:   Cassandra Baker  is a 82 y.o. with past medical history  of osteoarthritis, asthma, CHF, advanced CKD, depression, diabetes, hypertension, and hypothyroidism. Patient presents to the ED for lower extremity edema and has been admitted for ESRD (end stage renal disease) (HCC) [N18.6] Other hypervolemia [E87.79]  Cardiac catheterization today. Three vessel disease. She is to be transferred to [facility] for CABG evaluation.   Patient seen and examined on hemodialysis treatment. Second treatment.     HEMODIALYSIS FLOWSHEET:  Blood Flow Rate (mL/min): 250 mL/min Arterial Pressure (mmHg): -90 mmHg Venous Pressure (mmHg): 190 mmHg TMP (mmHg): 18 mmHg Ultrafiltration Rate (mL/min): 120 mL/min Dialysate Flow Rate (mL/min): 300 ml/min    Objective:  Vital signs: Blood pressure 136/66, pulse 76, temperature 98.3 F (36.8 C), temperature source Oral, resp. rate 17, height 5\' 4"  (1.626 m), weight 66.7 kg, SpO2 99 %.  Intake/Output Summary (Last 24 hours) at 06/15/2023 1800 Last data filed at 06/15/2023 1700 Gross per 24 hour  Intake 305.5 ml  Output 200 ml  Net 105.5 ml    Filed Weights   06/14/23 0215 06/15/23 0321 06/15/23 1623  Weight: 67.6 kg 67.8 kg 66.7 kg     Physical Exam: General:  No acute distress  Head:  Normocephalic, atraumatic. Moist oral mucosal membranes  Eyes:  Anicteric  Lungs:   Clear to auscultation, normal effort  Heart:  regular  Abdomen:   Soft, nontender, bowel sounds present  Extremities:  1+ peripheral edema.  Neurologic:  Awake, alert, following commands  Skin:  No lesions  Access:  Right femoral temp HD catheter 6/21 Dr. Gilda Crease    Basic Metabolic Panel: Recent Labs  Lab 06/11/23 1322 06/12/23 0413 06/13/23 0433 06/15/23 0413 06/15/23 1001 06/15/23 1002  NA 131* 133* 133* 125* 124* 123*  K 5.1 4.7 4.8 4.1 4.3 4.4  CL 95* 100 96* 90*  --   --   CO2 21* 20* 24 21*  --   --    GLUCOSE 146* 108* 132* 134*  --   --   BUN 97* 106* 70* 72*  --   --   CREATININE 6.45* 6.42* 4.68* 4.91*  --   --   CALCIUM 9.7 9.3 9.4 9.3  --   --   MG  --   --  2.5*  --   --   --   PHOS  --   --  8.0*  --   --   --     GFR: Estimated Creatinine Clearance: 8.3 mL/min (A) (by C-G formula based on SCr of 4.91 mg/dL (H)).  Liver Function Tests: Recent Labs  Lab 06/11/23 1322 06/13/23 0433  AST 15  --   ALT 28  --   ALKPHOS 106  --   BILITOT 0.6  --   PROT 6.6  --   ALBUMIN 3.7 3.4*    No results for input(s): "LIPASE", "AMYLASE" in the last 168 hours. No results for input(s): "AMMONIA" in the last 168 hours.  CBC: Recent Labs  Lab 06/11/23 1322 06/12/23 0413 06/13/23 0433 06/14/23 0621 06/15/23 0413 06/15/23 1001 06/15/23 1002  WBC 8.8 8.1 8.5 9.4 6.7  --   --   NEUTROABS  --   --  6.7  --   --   --   --   HGB 8.3* 7.8* 8.7* 8.4* 8.0* 7.8* 8.5*  HCT 26.7* 24.7* 27.6* 26.5* 25.6* 23.0* 25.0*  MCV 96.7  95.7 96.2 95.7 95.9  --   --   PLT 200 179 175 168 147*  --   --       HbA1C: Hgb A1c MFr Bld  Date/Time Value Ref Range Status  06/04/2023 04:08 AM 6.4 (H) 4.8 - 5.6 % Final    Comment:    (NOTE) Pre diabetes:          5.7%-6.4%  Diabetes:              >6.4%  Glycemic control for   <7.0% adults with diabetes   02/04/2018 11:04 AM 6.9 (H) 4.8 - 5.6 % Final    Comment:    (NOTE) Pre diabetes:          5.7%-6.4% Diabetes:              >6.4% Glycemic control for   <7.0% adults with diabetes     Urinalysis: No results for input(s): "COLORURINE", "LABSPEC", "PHURINE", "GLUCOSEU", "HGBUR", "BILIRUBINUR", "KETONESUR", "PROTEINUR", "UROBILINOGEN", "NITRITE", "LEUKOCYTESUR" in the last 72 hours.  Invalid input(s): "APPERANCEUR"     Imaging: CARDIAC CATHETERIZATION  Result Date: 06/15/2023   Mid LAD lesion is 80% stenosed.   Mid Cx to Dist Cx lesion is 90% stenosed.   Ramus lesion is 60% stenosed.   Prox RCA to Mid RCA lesion is 95% stenosed.   Mid  RCA to Dist RCA lesion is 60% stenosed.   RPDA lesion is 90% stenosed.   RPAV lesion is 80% stenosed.   There is moderate to severe left ventricular systolic dysfunction.   LV end diastolic pressure is mildly elevated.   The left ventricular ejection fraction is 25-35% by visual estimate. 1.  Severe three-vessel coronary artery disease.  The culprit for myocardial infarction is likely subtotal occlusion of the right coronary artery which is heavily calcified and diffusely diseased in the proximal/mid segment.  In addition, there is complex bifurcation disease at the right PDA and right posterior AV groove artery. 2.  Mildly with severely reduced LV systolic function with an EF of 30% with severe inferior and apical hypokinesis. 3.  Right heart catheterization showed mildly elevated filling pressures, mild pulmonary hypertension and normal cardiac output.  No significant V waves noted on wedge tracing. Recommendations: Difficult revascularization options.  The patient has surgical disease but she might not be a candidate for CABG.  The RCA is the culprit but it is diffusely diseased and heavily calcified.  Will discuss with patient and family and consider transfer to [facility] for evaluation.  If we decide to PCI, it is preferable to transfuse her to get her hemoglobin above 9 and load with an antiplatelet medication.     Medications:    sodium chloride     anticoagulant sodium citrate     heparin 1,100 Units/hr (06/15/23 1700)    aspirin EC  81 mg Oral Daily   atorvastatin  80 mg Oral Daily   calcium acetate  1,334 mg Oral TID WC   cephALEXin  500 mg Oral Q12H   Chlorhexidine Gluconate Cloth  6 each Topical Q0600   FLUoxetine  10 mg Oral Once per day on Mon Wed Fri   furosemide  80 mg Oral BID   insulin aspart  0-6 Units Subcutaneous TID WC   levothyroxine  75 mcg Oral QAC breakfast   metoprolol succinate  25 mg Oral Daily   multivitamin with minerals  1 tablet Oral Daily   sodium  bicarbonate  650 mg Oral BID  sodium chloride flush  3 mL Intravenous Q12H   sodium chloride flush  3 mL Intravenous Q12H    Assessment/ Plan:     Cassandra Baker is a 82 y.o. female with medical problems of osteoarthritis, asthma, CHF, advanced CKD, depression, diabetes, hypertension, hypothyroidism was admitted on 06/11/2023 for ESRD (end stage renal disease) (HCC) [N18.6] Other hypervolemia [E87.79] for    #1: End stage renal disease requiring dialysis.  - next dialysis for tomorrow.  - outpatient dialysis planning. Patient prefers MWF schedule.   #2: Hypertension with end stage renal disease and Congestive heart failure: ECHO form 06/04/23 shows EF 30-35% with mild global hypokinesis and a grade 1 diastolic dysfunction. Acute exacerbation on admission.   - Continue metoprolol and furosemide.   - consider adding ARB before discharge.    #3: Anemia with chronic kidney disease. - Holding ESA due to concerns for cardiac ischemia.     #4: Secondary Hyperparathyroidism: with hyperphosphatemia Lab Results  Component Value Date   CALCIUM 9.3 06/15/2023   CAION 1.20 06/15/2023   PHOS 8.0 (H) 06/13/2023    Patient was prescribed calcitriol and calcium acetate outpatient.  - calcium acetate with meals.       LOS: 4 [facility] 6/24/20246:00 PM

## 2023-06-15 NOTE — Discharge Planning (Signed)
Informed that patient is transferring to [facility] for further medical needs. Patient requested referral sent to [facility] for OPHD placement. Referral is in review. Patient follows Nephrologist Dr. Allena Katz in [place]. Sent Olivia Canter, renal navigator, at [facility] a secure message informing her of this patient's OPHD status.

## 2023-06-15 NOTE — Progress Notes (Signed)
Patient transported to cath lab, family at bedside.

## 2023-06-16 ENCOUNTER — Inpatient Hospital Stay (HOSPITAL_COMMUNITY)
Admission: AD | Admit: 2023-06-16 | Discharge: 2023-06-28 | DRG: 323 | Disposition: A | Discharge status: Home / Self Care | Payer: Medicare Other | Source: Other Acute Inpatient Hospital | Attending: Cardiovascular Disease | Admitting: Cardiovascular Disease

## 2023-06-16 DIAGNOSIS — F32A Depression, unspecified: Secondary | ICD-10-CM | POA: Diagnosis present

## 2023-06-16 DIAGNOSIS — I2510 Atherosclerotic heart disease of native coronary artery without angina pectoris: Secondary | ICD-10-CM | POA: Diagnosis present

## 2023-06-16 DIAGNOSIS — N185 Chronic kidney disease, stage 5: Secondary | ICD-10-CM | POA: Diagnosis not present

## 2023-06-16 DIAGNOSIS — I5043 Acute on chronic combined systolic (congestive) and diastolic (congestive) heart failure: Secondary | ICD-10-CM | POA: Diagnosis not present

## 2023-06-16 DIAGNOSIS — I340 Nonrheumatic mitral (valve) insufficiency: Secondary | ICD-10-CM | POA: Diagnosis present

## 2023-06-16 DIAGNOSIS — Z91011 Allergy to milk products: Secondary | ICD-10-CM

## 2023-06-16 DIAGNOSIS — Z8049 Family history of malignant neoplasm of other genital organs: Secondary | ICD-10-CM

## 2023-06-16 DIAGNOSIS — I472 Ventricular tachycardia, unspecified: Secondary | ICD-10-CM | POA: Diagnosis not present

## 2023-06-16 DIAGNOSIS — Z79890 Hormone replacement therapy (postmenopausal): Secondary | ICD-10-CM | POA: Diagnosis not present

## 2023-06-16 DIAGNOSIS — Z85828 Personal history of other malignant neoplasm of skin: Secondary | ICD-10-CM

## 2023-06-16 DIAGNOSIS — I4721 Torsades de pointes: Secondary | ICD-10-CM | POA: Diagnosis present

## 2023-06-16 DIAGNOSIS — Z7982 Long term (current) use of aspirin: Secondary | ICD-10-CM

## 2023-06-16 DIAGNOSIS — F05 Delirium due to known physiological condition: Secondary | ICD-10-CM | POA: Diagnosis present

## 2023-06-16 DIAGNOSIS — I120 Hypertensive chronic kidney disease with stage 5 chronic kidney disease or end stage renal disease: Secondary | ICD-10-CM | POA: Diagnosis not present

## 2023-06-16 DIAGNOSIS — N390 Urinary tract infection, site not specified: Secondary | ICD-10-CM | POA: Diagnosis present

## 2023-06-16 DIAGNOSIS — E871 Hypo-osmolality and hyponatremia: Secondary | ICD-10-CM | POA: Diagnosis present

## 2023-06-16 DIAGNOSIS — I5020 Unspecified systolic (congestive) heart failure: Secondary | ICD-10-CM

## 2023-06-16 DIAGNOSIS — D631 Anemia in chronic kidney disease: Secondary | ICD-10-CM | POA: Diagnosis present

## 2023-06-16 DIAGNOSIS — E1122 Type 2 diabetes mellitus with diabetic chronic kidney disease: Secondary | ICD-10-CM | POA: Diagnosis present

## 2023-06-16 DIAGNOSIS — R0989 Other specified symptoms and signs involving the circulatory and respiratory systems: Secondary | ICD-10-CM | POA: Diagnosis not present

## 2023-06-16 DIAGNOSIS — Z992 Dependence on renal dialysis: Secondary | ICD-10-CM | POA: Diagnosis not present

## 2023-06-16 DIAGNOSIS — R0602 Shortness of breath: Secondary | ICD-10-CM | POA: Diagnosis not present

## 2023-06-16 DIAGNOSIS — I272 Pulmonary hypertension, unspecified: Secondary | ICD-10-CM | POA: Diagnosis present

## 2023-06-16 DIAGNOSIS — J811 Chronic pulmonary edema: Secondary | ICD-10-CM | POA: Diagnosis not present

## 2023-06-16 DIAGNOSIS — I25110 Atherosclerotic heart disease of native coronary artery with unstable angina pectoris: Secondary | ICD-10-CM | POA: Diagnosis not present

## 2023-06-16 DIAGNOSIS — I5042 Chronic combined systolic (congestive) and diastolic (congestive) heart failure: Secondary | ICD-10-CM | POA: Diagnosis not present

## 2023-06-16 DIAGNOSIS — Z79899 Other long term (current) drug therapy: Secondary | ICD-10-CM

## 2023-06-16 DIAGNOSIS — M159 Polyosteoarthritis, unspecified: Secondary | ICD-10-CM | POA: Diagnosis present

## 2023-06-16 DIAGNOSIS — Z880 Allergy status to penicillin: Secondary | ICD-10-CM

## 2023-06-16 DIAGNOSIS — I3481 Nonrheumatic mitral (valve) annulus calcification: Secondary | ICD-10-CM | POA: Diagnosis present

## 2023-06-16 DIAGNOSIS — R451 Restlessness and agitation: Secondary | ICD-10-CM | POA: Diagnosis present

## 2023-06-16 DIAGNOSIS — N186 End stage renal disease: Secondary | ICD-10-CM | POA: Diagnosis present

## 2023-06-16 DIAGNOSIS — M7989 Other specified soft tissue disorders: Secondary | ICD-10-CM | POA: Diagnosis present

## 2023-06-16 DIAGNOSIS — G253 Myoclonus: Secondary | ICD-10-CM | POA: Diagnosis present

## 2023-06-16 DIAGNOSIS — B9620 Unspecified Escherichia coli [E. coli] as the cause of diseases classified elsewhere: Secondary | ICD-10-CM | POA: Diagnosis present

## 2023-06-16 DIAGNOSIS — E119 Type 2 diabetes mellitus without complications: Secondary | ICD-10-CM

## 2023-06-16 DIAGNOSIS — I214 Non-ST elevation (NSTEMI) myocardial infarction: Secondary | ICD-10-CM | POA: Diagnosis present

## 2023-06-16 DIAGNOSIS — D638 Anemia in other chronic diseases classified elsewhere: Secondary | ICD-10-CM | POA: Diagnosis not present

## 2023-06-16 DIAGNOSIS — Z91018 Allergy to other foods: Secondary | ICD-10-CM

## 2023-06-16 DIAGNOSIS — I5023 Acute on chronic systolic (congestive) heart failure: Secondary | ICD-10-CM | POA: Diagnosis not present

## 2023-06-16 DIAGNOSIS — I2584 Coronary atherosclerosis due to calcified coronary lesion: Secondary | ICD-10-CM | POA: Diagnosis present

## 2023-06-16 DIAGNOSIS — J45909 Unspecified asthma, uncomplicated: Secondary | ICD-10-CM | POA: Diagnosis present

## 2023-06-16 DIAGNOSIS — I4729 Other ventricular tachycardia: Secondary | ICD-10-CM | POA: Diagnosis present

## 2023-06-16 DIAGNOSIS — Z8249 Family history of ischemic heart disease and other diseases of the circulatory system: Secondary | ICD-10-CM

## 2023-06-16 DIAGNOSIS — I255 Ischemic cardiomyopathy: Secondary | ICD-10-CM | POA: Diagnosis present

## 2023-06-16 DIAGNOSIS — Z823 Family history of stroke: Secondary | ICD-10-CM

## 2023-06-16 DIAGNOSIS — E039 Hypothyroidism, unspecified: Secondary | ICD-10-CM | POA: Diagnosis present

## 2023-06-16 DIAGNOSIS — R531 Weakness: Secondary | ICD-10-CM | POA: Diagnosis not present

## 2023-06-16 DIAGNOSIS — J90 Pleural effusion, not elsewhere classified: Secondary | ICD-10-CM | POA: Diagnosis not present

## 2023-06-16 DIAGNOSIS — I132 Hypertensive heart and chronic kidney disease with heart failure and with stage 5 chronic kidney disease, or end stage renal disease: Secondary | ICD-10-CM | POA: Diagnosis not present

## 2023-06-16 DIAGNOSIS — I130 Hypertensive heart and chronic kidney disease with heart failure and stage 1 through stage 4 chronic kidney disease, or unspecified chronic kidney disease: Secondary | ICD-10-CM | POA: Diagnosis not present

## 2023-06-16 DIAGNOSIS — F419 Anxiety disorder, unspecified: Secondary | ICD-10-CM | POA: Diagnosis present

## 2023-06-16 DIAGNOSIS — Z955 Presence of coronary angioplasty implant and graft: Secondary | ICD-10-CM

## 2023-06-16 DIAGNOSIS — I493 Ventricular premature depolarization: Secondary | ICD-10-CM | POA: Diagnosis present

## 2023-06-16 DIAGNOSIS — Z7984 Long term (current) use of oral hypoglycemic drugs: Secondary | ICD-10-CM

## 2023-06-16 DIAGNOSIS — E8779 Other fluid overload: Secondary | ICD-10-CM | POA: Diagnosis present

## 2023-06-16 DIAGNOSIS — I5021 Acute systolic (congestive) heart failure: Secondary | ICD-10-CM | POA: Diagnosis not present

## 2023-06-16 DIAGNOSIS — I10 Essential (primary) hypertension: Secondary | ICD-10-CM | POA: Diagnosis present

## 2023-06-16 DIAGNOSIS — Z90710 Acquired absence of both cervix and uterus: Secondary | ICD-10-CM

## 2023-06-16 DIAGNOSIS — I252 Old myocardial infarction: Secondary | ICD-10-CM

## 2023-06-16 DIAGNOSIS — N2581 Secondary hyperparathyroidism of renal origin: Secondary | ICD-10-CM | POA: Diagnosis not present

## 2023-06-16 DIAGNOSIS — J9811 Atelectasis: Secondary | ICD-10-CM | POA: Diagnosis not present

## 2023-06-16 DIAGNOSIS — I517 Cardiomegaly: Secondary | ICD-10-CM | POA: Diagnosis not present

## 2023-06-16 LAB — CBC
HCT: 25 % — ABNORMAL LOW (ref 36.0–46.0)
HCT: 25.5 % — ABNORMAL LOW (ref 36.0–46.0)
Hemoglobin: 7.8 g/dL — ABNORMAL LOW (ref 12.0–15.0)
Hemoglobin: 8.1 g/dL — ABNORMAL LOW (ref 12.0–15.0)
MCH: 30.1 pg (ref 26.0–34.0)
MCH: 30.1 pg (ref 26.0–34.0)
MCHC: 31.2 g/dL (ref 30.0–36.0)
MCHC: 31.8 g/dL (ref 30.0–36.0)
MCV: 96.5 fL (ref 80.0–100.0)
MCV: 94.8 fL (ref 80.0–100.0)
Platelets: 142 10*3/uL — ABNORMAL LOW (ref 150–400)
Platelets: 183 10*3/uL (ref 150–400)
RBC: 2.59 MIL/uL — ABNORMAL LOW (ref 3.87–5.11)
RBC: 2.69 MIL/uL — ABNORMAL LOW (ref 3.87–5.11)
RDW: 13.2 % (ref 11.5–15.5)
RDW: 13.5 % (ref 11.5–15.5)
WBC: 8.4 10*3/uL (ref 4.0–10.5)
WBC: 10.6 10*3/uL — ABNORMAL HIGH (ref 4.0–10.5)
nRBC: 0 % (ref 0.0–0.2)
nRBC: 0 % (ref 0.0–0.2)

## 2023-06-16 LAB — BASIC METABOLIC PANEL
Anion gap: 10 (ref 5–15)
Anion gap: 13 (ref 5–15)
BUN: 59 mg/dL — ABNORMAL HIGH (ref 8–23)
BUN: 58 mg/dL — ABNORMAL HIGH (ref 8–23)
CO2: 25 mmol/L (ref 22–32)
CO2: 23 mmol/L (ref 22–32)
Calcium: 9 mg/dL (ref 8.9–10.3)
Calcium: 9.1 mg/dL (ref 8.9–10.3)
Chloride: 91 mmol/L — ABNORMAL LOW (ref 98–111)
Chloride: 86 mmol/L — ABNORMAL LOW (ref 98–111)
Creatinine, Ser: 4.38 mg/dL — ABNORMAL HIGH (ref 0.44–1.00)
Creatinine, Ser: 4.71 mg/dL — ABNORMAL HIGH (ref 0.44–1.00)
GFR, Estimated: 10 mL/min — ABNORMAL LOW (ref >60)
GFR, Estimated: 9 mL/min — ABNORMAL LOW (ref >60)
Glucose, Bld: 120 mg/dL — ABNORMAL HIGH (ref 70–99)
Glucose, Bld: 146 mg/dL — ABNORMAL HIGH (ref 70–99)
Potassium: 4.4 mmol/L (ref 3.5–5.1)
Potassium: 4.6 mmol/L (ref 3.5–5.1)
Sodium: 126 mmol/L — ABNORMAL LOW (ref 135–145)
Sodium: 122 mmol/L — ABNORMAL LOW (ref 135–145)

## 2023-06-16 LAB — GLUCOSE, CAPILLARY
Glucose-Capillary: 106 mg/dL — ABNORMAL HIGH (ref 70–99)
Glucose-Capillary: 220 mg/dL — ABNORMAL HIGH (ref 70–99)
Glucose-Capillary: 145 mg/dL — ABNORMAL HIGH (ref 70–99)
Glucose-Capillary: 126 mg/dL — ABNORMAL HIGH (ref 70–99)

## 2023-06-16 LAB — TROPONIN I (HIGH SENSITIVITY): Troponin I (High Sensitivity): 209 ng/L (ref <18)

## 2023-06-16 LAB — TYPE AND SCREEN
ABO/RH(D): A POS
Unit division: 0

## 2023-06-16 LAB — PREPARE RBC (CROSSMATCH)

## 2023-06-16 LAB — MAGNESIUM: Magnesium: 2.2 mg/dL (ref 1.7–2.4)

## 2023-06-16 LAB — HEPARIN LEVEL (UNFRACTIONATED): Heparin Unfractionated: 0.5 IU/mL (ref 0.30–0.70)

## 2023-06-16 LAB — PHOSPHORUS: Phosphorus: 6.2 mg/dL — ABNORMAL HIGH (ref 2.5–4.6)

## 2023-06-16 MED ORDER — SODIUM CHLORIDE 0.9% FLUSH: 3.0000 mL | INTRAVENOUS | Status: DC | PRN

## 2023-06-16 MED ORDER — ATORVASTATIN CALCIUM 80 MG PO TABS
80.0000 mg | ORAL_TABLET | Freq: Every day | ORAL | Status: DC
  Administered 2023-06-17 – 2023-06-28 (×11): 80 mg via ORAL
  Filled 2023-06-16 (×12): qty 1

## 2023-06-16 MED ORDER — LORAZEPAM 2 MG/ML PO CONC
1.0000 mg | ORAL | Status: AC | PRN
  Administered 2023-06-16: 1 mg via ORAL
  Filled 2023-06-16: qty 1

## 2023-06-16 MED ORDER — LEVOTHYROXINE SODIUM 75 MCG PO TABS
75.0000 ug | ORAL_TABLET | Freq: Every day | ORAL | Status: DC
  Administered 2023-06-17 – 2023-06-28 (×12): 75 ug via ORAL
  Filled 2023-06-16 (×12): qty 1

## 2023-06-16 MED ORDER — CHLORHEXIDINE GLUCONATE CLOTH 2 % EX PADS
6.0000 | MEDICATED_PAD | Freq: Every day | CUTANEOUS | Status: DC
  Administered 2023-06-18 – 2023-06-21 (×4): 6 via TOPICAL

## 2023-06-16 MED ORDER — HEPARIN SODIUM (PORCINE) 1000 UNIT/ML IJ SOLN
INTRAMUSCULAR | Status: AC
  Filled 2023-06-16: qty 10

## 2023-06-16 MED ORDER — ONDANSETRON HCL 4 MG/2ML IJ SOLN: 4.0000 mg | Freq: Four times a day (QID) | INTRAMUSCULAR | Status: DC | PRN

## 2023-06-16 MED ORDER — SODIUM CHLORIDE 0.9% FLUSH
3.0000 mL | Freq: Two times a day (BID) | INTRAVENOUS | Status: DC
  Administered 2023-06-16 – 2023-06-18 (×4): 3 mL via INTRAVENOUS

## 2023-06-16 MED ORDER — HEPARIN (PORCINE) 25000 UT/250ML-% IV SOLN
1100.0000 [IU]/h | INTRAVENOUS | Status: DC
  Administered 2023-06-16 (×2): 1100 [IU]/h via INTRAVENOUS
  Filled 2023-06-16: qty 250

## 2023-06-16 MED ORDER — CALCIUM ACETATE (PHOS BINDER) 667 MG PO CAPS
667.0000 mg | ORAL_CAPSULE | Freq: Three times a day (TID) | ORAL | Status: DC
  Administered 2023-06-16 – 2023-06-28 (×28): 667 mg via ORAL
  Filled 2023-06-16 (×27): qty 1

## 2023-06-16 MED ORDER — CLOPIDOGREL BISULFATE 75 MG PO TABS
75.0000 mg | ORAL_TABLET | Freq: Every day | ORAL | Status: DC
  Administered 2023-06-17 – 2023-06-28 (×12): 75 mg via ORAL
  Filled 2023-06-16 (×13): qty 1

## 2023-06-16 MED ORDER — CEPHALEXIN 500 MG PO CAPS
500.0000 mg | ORAL_CAPSULE | Freq: Two times a day (BID) | ORAL | Status: AC
  Administered 2023-06-17 (×2): 500 mg via ORAL
  Filled 2023-06-16 (×3): qty 1

## 2023-06-16 MED ORDER — FLUOXETINE HCL 10 MG PO CAPS
10.0000 mg | ORAL_CAPSULE | ORAL | Status: DC
  Administered 2023-06-17 – 2023-06-26 (×5): 10 mg via ORAL
  Filled 2023-06-16 (×5): qty 1

## 2023-06-16 MED ORDER — AMIODARONE HCL IN DEXTROSE 360-4.14 MG/200ML-% IV SOLN: 30.0000 mg/h | INTRAVENOUS | Status: DC

## 2023-06-16 MED ORDER — ASPIRIN 81 MG PO CHEW: 324.0000 mg | CHEWABLE_TABLET | ORAL | Status: DC

## 2023-06-16 MED ORDER — NITROGLYCERIN 0.4 MG SL SUBL: 0.4000 mg | SUBLINGUAL_TABLET | SUBLINGUAL | Status: DC | PRN

## 2023-06-16 MED ORDER — FUROSEMIDE 40 MG PO TABS
80.0000 mg | ORAL_TABLET | Freq: Two times a day (BID) | ORAL | Status: DC
  Administered 2023-06-16 – 2023-06-28 (×21): 80 mg via ORAL
  Filled 2023-06-16 (×21): qty 2

## 2023-06-16 MED ORDER — ASPIRIN 81 MG PO TBEC
81.0000 mg | DELAYED_RELEASE_TABLET | Freq: Every day | ORAL | Status: DC
  Administered 2023-06-17 – 2023-06-28 (×12): 81 mg via ORAL
  Filled 2023-06-16 (×13): qty 1

## 2023-06-16 MED ORDER — INSULIN ASPART 100 UNIT/ML IJ SOLN
0.0000 [IU] | Freq: Three times a day (TID) | INTRAMUSCULAR | Status: DC
  Administered 2023-06-18: 3 [IU] via SUBCUTANEOUS
  Administered 2023-06-19: 2 [IU] via SUBCUTANEOUS
  Administered 2023-06-19: 3 [IU] via SUBCUTANEOUS
  Administered 2023-06-21: 5 [IU] via SUBCUTANEOUS
  Administered 2023-06-21 – 2023-06-24 (×9): 2 [IU] via SUBCUTANEOUS
  Administered 2023-06-25: 3 [IU] via SUBCUTANEOUS
  Administered 2023-06-26 – 2023-06-27 (×5): 2 [IU] via SUBCUTANEOUS
  Administered 2023-06-28: 3 [IU] via SUBCUTANEOUS
  Administered 2023-06-28: 2 [IU] via SUBCUTANEOUS

## 2023-06-16 MED ORDER — ACETAMINOPHEN 325 MG PO TABS
650.0000 mg | ORAL_TABLET | ORAL | Status: DC | PRN
  Administered 2023-06-16 – 2023-06-26 (×12): 650 mg via ORAL
  Filled 2023-06-16 (×12): qty 2

## 2023-06-16 MED ORDER — HEPARIN SODIUM (PORCINE) 1000 UNIT/ML IJ SOLN
INTRAMUSCULAR | Status: AC
  Filled 2023-06-16: qty 4

## 2023-06-16 MED ORDER — SODIUM CHLORIDE 0.9 % IV SOLN: 250.0000 mL | INTRAVENOUS | Status: DC | PRN

## 2023-06-16 MED ORDER — METOPROLOL SUCCINATE ER 50 MG PO TB24
25.0000 mg | ORAL_TABLET | Freq: Every day | ORAL | Status: DC
  Administered 2023-06-16: 25 mg via ORAL
  Filled 2023-06-16: qty 1

## 2023-06-16 MED ORDER — SODIUM CHLORIDE 0.9% IV SOLUTION: Freq: Once | INTRAVENOUS | Status: DC

## 2023-06-16 MED ORDER — ASPIRIN 300 MG RE SUPP: 300.0000 mg | RECTAL | Status: DC

## 2023-06-16 MED ORDER — AMIODARONE HCL IN DEXTROSE 360-4.14 MG/200ML-% IV SOLN
30.0000 mg/h | INTRAVENOUS | Status: DC
  Administered 2023-06-16 – 2023-06-17 (×3): 30 mg/h via INTRAVENOUS
  Filled 2023-06-16 (×2): qty 200

## 2023-06-16 MED ORDER — AMIODARONE HCL IN DEXTROSE 360-4.14 MG/200ML-% IV SOLN
60.0000 mg/h | INTRAVENOUS | Status: DC
  Administered 2023-06-16: 60 mg/h via INTRAVENOUS
  Filled 2023-06-16: qty 200

## 2023-06-16 MED ORDER — METOPROLOL SUCCINATE ER 25 MG PO TB24
25.0000 mg | ORAL_TABLET | Freq: Every day | ORAL | Status: DC
  Administered 2023-06-17 – 2023-06-28 (×11): 25 mg via ORAL
  Filled 2023-06-16 (×11): qty 1

## 2023-06-16 MED ORDER — SODIUM CHLORIDE 0.9% FLUSH
3.0000 mL | Freq: Two times a day (BID) | INTRAVENOUS | Status: DC
  Administered 2023-06-16 – 2023-06-17 (×2): 3 mL via INTRAVENOUS

## 2023-06-16 MED ORDER — HEPARIN (PORCINE) IN NACL 2-0.9 UNITS/ML: INTRAMUSCULAR | Status: DC

## 2023-06-16 MED ORDER — CLOPIDOGREL BISULFATE 300 MG PO TABS
300.0000 mg | ORAL_TABLET | Freq: Once | ORAL | Status: AC
  Administered 2023-06-16: 300 mg via ORAL
  Filled 2023-06-16: qty 1

## 2023-06-16 MED ORDER — METOPROLOL SUCCINATE ER 25 MG PO TB24: 25.0000 mg | ORAL_TABLET | Freq: Every day | ORAL | Status: DC

## 2023-06-16 MED ORDER — SODIUM BICARBONATE 650 MG PO TABS
650.0000 mg | ORAL_TABLET | Freq: Two times a day (BID) | ORAL | Status: DC
  Administered 2023-06-17 – 2023-06-25 (×18): 650 mg via ORAL
  Filled 2023-06-16 (×18): qty 1

## 2023-06-16 NOTE — Consult Note (Addendum)
aRenal Service Consult Note [facility]  Cassandra Baker 06/16/2023 Maree Krabbe, MD Requesting Physician: Dr. Excell Seltzer  Reason for Consult: new ESRD pt w/ NSTEMI and 3VCAD by cath at [facility], now transferred to [facility]  HPI: The patient is a 82 y.o. year-old w/ PMH as below who presented to [facility] on 06/11/23 w/ bilat LE edema, fatigue, gen'd weakness and SOB. Pt was admitted and pt's CKD 4 had progressed to CKD 5 so she was started on dialysis. Her 1st HD was 6/22 and her 2nd HD was 6/24. She was recently here also from 6/13- 6/16 fo NSTEMI w/ a/c syst CHF. Heart cath was deferred due to CKD. This admit heart cath was done and showed severe 3V CAD. Pt has now been transferred to [facility] for further assessment. We are asked to see for esrd.   Pt was f/b Dr Hyman Hopes at [facility] and had AVF LUA placed in 2022 but the access failed. The patient seems to have tried to deal w/ her CKD symptoms which have been "jerking" of the arms and nausea, fatigue for the last 2-3 years. She is now followed by Dr Allena Katz at [facility].    Pt had 2hrs HD on Friday last week, and was supposed to have 2.5 hrs on Sunday but she signed off at 1 hr due to "jerking" of her arms and anxiety she says today. All in all, she is okay w/ going ahead with long-term dialysis. I told her that some of these symptoms should resolve when she has had a few more good dialysis sessions.    ROS - denies CP, no joint pain, no HA, no blurry vision, no rash, no diarrhea, no nausea/ vomiting, no dysuria, no difficulty voiding   Past Medical History  Past Medical History:  Diagnosis Date   Anemia    Arthritis    low back,knees   Asthma    Basal cell carcinoma (BCC)    a. nose   CHF (congestive heart failure) (HCC)    a. prev eval by Dr. Katrinka Blazing in [place] - "many yrs ago."   CKD (chronic kidney disease), stage IV (HCC)    Depression    Diabetes mellitus without complication (HCC)    Dyspnea    Hypertension    Hypothyroidism    Past Surgical  History  Past Surgical History:  Procedure Laterality Date   APPENDECTOMY     4 th grade   AV FISTULA PLACEMENT Left 05/12/2019   Procedure: BRACHIOCEPHALIC ARTERIOVENOUS (AV) FISTULA CREATION LEFT ARM;  Surgeon: Nada Libman, MD;  Location: [facility] OR;  Service: Vascular;  Laterality: Left;   EYE SURGERY  2014   Bilateral 6 mos. apart  cataracts   FISTULA SUPERFICIALIZATION Left 10/06/2019   Procedure: ELEVATION OF ARTERIOVENOUS FISTULA LEFT ARM;  Surgeon: Nada Libman, MD;  Location: [facility] OR;  Service: Vascular;  Laterality: Left;   LYMPH NODE BIOPSY N/A 02/09/2018   Procedure: SENTINEL LYMPH NODE BIOPSY;  Surgeon: Adolphus Birchwood, MD;  Location: [facility] ORS;  Service: Gynecology;  Laterality: N/A;   PARATHYROIDECTOMY N/A 12/07/2014   Procedure: PARATHYROIDECTOMY;  Surgeon: Darnell Level, MD;  Location: [facility] ORS;  Service: General;  Laterality: N/A;   RIGHT/LEFT HEART CATH AND CORONARY ANGIOGRAPHY N/A 06/15/2023   Procedure: RIGHT/LEFT HEART CATH AND CORONARY ANGIOGRAPHY;  Surgeon: Iran Ouch, MD;  Location: [facility] INVASIVE CV LAB;  Service: Cardiovascular;  Laterality: N/A;   ROBOTIC ASSISTED TOTAL HYSTERECTOMY WITH BILATERAL SALPINGO OOPHERECTOMY Bilateral 02/09/2018   Procedure:  XI ROBOTIC ASSISTED TOTAL HYSTERECTOMY WITH BILATERAL SALPINGO OOPHORECTOMY;  Surgeon: Adolphus Birchwood, MD;  Location: [facility] ORS;  Service: Gynecology;  Laterality: Bilateral;   TEMPORARY DIALYSIS CATHETER N/A 06/12/2023   Procedure: TEMPORARY DIALYSIS CATHETER;  Surgeon: Renford Dills, MD;  Location: [facility] INVASIVE CV LAB;  Service: Cardiovascular;  Laterality: N/A;   THYROID LOBECTOMY Left 12/07/2014   Procedure: LEFT THYROID LOBECTOMY;  Surgeon: Darnell Level, MD;  Location: [facility] ORS;  Service: General;  Laterality: Left;   TONSILLECTOMY     TUBAL LIGATION     Early thirties   Family History  Family History  Problem Relation Age of Onset   Hypertension Mother        MI's beginning in 59's, died @ 59   Heart disease  Mother    Hypertension Father    Heart disease Father    Heart disease Brother    Stroke Brother    Cervical cancer Maternal Aunt    Heart disease Brother    Stroke Brother    Social History  reports that she has never smoked. She has never used smokeless tobacco. She reports that she does not drink alcohol and does not use drugs. Allergies  Allergies  Allergen Reactions   Plum Pulp Anaphylaxis and Cough   Citrus     Coughing, chest tightness    Other Itching    Dairy products/Soaps & detergents   Penicillins Rash    Tolerates ceftriaxone   Home medications Prior to Admission medications   Medication Sig Start Date End Date Taking? Authorizing Provider  acetaminophen (TYLENOL) 500 MG tablet Take 500-1,000 mg by mouth every 6 (six) hours as needed (pain).     [provider]  allopurinol (ZYLOPRIM) 100 MG tablet Take 100 mg by mouth daily as needed.    [provider]  aspirin EC 81 MG tablet Take 1 tablet (81 mg total) by mouth daily. Swallow whole. 06/16/23   Lurene Shadow, MD  atorvastatin (LIPITOR) 80 MG tablet Take 1 tablet (80 mg total) by mouth daily. 06/16/23   Lurene Shadow, MD  calcitRIOL (ROCALTROL) 0.5 MCG capsule Take 0.5 mcg by mouth daily.  04/01/19   [provider]  calcium acetate (PHOSLO) 667 MG tablet Take 667 mg by mouth 3 (three) times daily.    [provider]  FLUoxetine (PROZAC) 10 MG capsule Take 10 mg by mouth 3 (three) times a week. At night    [provider]  furosemide (LASIX) 40 MG tablet Take 80 mg by mouth 2 (two) times daily.     [provider]  hydrOXYzine (ATARAX/VISTARIL) 10 MG tablet Take 10 mg by mouth at bedtime as needed for itching.    [provider]  levothyroxine (SYNTHROID) 75 MCG tablet Take 75 mcg by mouth daily before breakfast.    [provider]  loratadine (CLARITIN) 5 MG chewable tablet Chew 5 mg by mouth daily as needed for allergies.    [provider]  metoprolol succinate (TOPROL-XL) 25 MG 24 hr tablet Take 1 tablet (25 mg total) by mouth daily. Take with or immediately following a meal. 06/17/23   Lurene Shadow, MD  sitaGLIPtin (JANUVIA) 25 MG tablet Take 25 mg by mouth daily.    [provider]  sodium bicarbonate 650 MG tablet Take 650 mg by mouth 2 (two) times daily. 04/26/19   [provider]     There were no vitals filed for this visit. Exam Gen alert, no distress No rash,  cyanosis or gangrene Sclera anicteric, throat clear  No jvd or bruits Chest clear bilat to bases, no rales/ wheezing RRR no MRG Abd soft ntnd no mass or ascites +bs GU defer MS no joint effusions or deformity Ext no LE or UE edema, no wounds or ulcers Neuro is alert, Ox 3 , nf    R fem temp HD cath      Home meds include - asa, lipitor, toprol xl 25 every day, zyloprim, phoslo 1 ac tid, prozac, lasix 80 bid, synthroid, sitagliptin, sod bicarb, prns/ vits/ supps    Assessment/ Plan: CKD V - pt is now esrd, had temp cath placed at [facility] in R fem vein and 1st HD was 6/21 and the 2nd HD was yesterday 6/24 (but was shortened to 1 hr by pt). She c/o what is most likely myoclonic jerking related to uremia, but does not show any signs of asterixis today. This should resolve w/ more dialysis. Plan is for HD tonight at bedside. Will consult IR for TDC.   NSTEMI/ CAD - w/ heart cath showing 3V CAD done at [facility]. Per cardiology.  HTN/ volume - BP's soft, no vol excess on exam, no edema, clear lungs and clear last CXR, keep even w/ HD tonight.  Hyponatremia - changing to renal diet w/ fluid restriction 1200 cc/d.  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 Tsat done 24%, ferritin 152.  MBD ckd - CCa in range. Getting phoslo at 1 ac tid. Get f/u phos. Get pth.  A/C systolic CHF - no vol overload. Echo showed LVEF 30-35%.  DM2 Hypothyroid Anxiety/ depression EColi UTI      Vinson Moselle  MD [facility] 06/16/2023, 4:32 PM  Recent Labs  Lab 06/11/23 1322  06/12/23 0413 06/13/23 0433 06/14/23 0621 06/15/23 0413 06/15/23 1001 06/15/23 1002 06/16/23 0421  HGB 8.3*   < > 8.7*   < > 8.0*   < > 8.5* 7.8*  ALBUMIN 3.7  --  3.4*  --   --   --   --   --   CALCIUM 9.7   < > 9.4  --  9.3  --   --  9.0  PHOS  --   --  8.0*  --   --   --   --   --   CREATININE 6.45*   < > 4.68*  --  4.91*  --   --  4.38*  K 5.1   < > 4.8  --  4.1   < > 4.4 4.4   < > = values in this interval not displayed.   Inpatient medications:  aspirin EC  81 mg Oral Daily   [START ON 06/17/2023] atorvastatin  80 mg Oral Daily   calcium acetate  667 mg Oral TID   cephALEXin  500 mg Oral BID   clopidogrel  300 mg Oral Once   [START ON 06/17/2023] clopidogrel  75 mg Oral Daily   [START ON 06/17/2023] FLUoxetine  10 mg Oral Once per day on Mon Wed Fri   furosemide  80 mg Oral BID   heparin sodium (porcine)       [START ON 06/17/2023] insulin aspart  0-15 Units Subcutaneous TID WC   [START ON 06/17/2023] levothyroxine  75 mcg Oral QAC breakfast   metoprolol succinate  25 mg Oral Daily   sodium bicarbonate  650 mg Oral BID   sodium chloride flush  3 mL Intravenous Q12H    sodium chloride  sodium chloride, acetaminophen, heparin sodium (porcine), nitroGLYCERIN, ondansetron (ZOFRAN) IV, sodium chloride flush

## 2023-06-16 NOTE — Progress Notes (Signed)
ANTICOAGULATION CONSULT NOTE  Pharmacy Consult for heparin Indication: chest pain/ACS  Allergies  Allergen Reactions   Plum Pulp Anaphylaxis and Cough   Citrus     Coughing, chest tightness    Other Itching    Dairy products/Soaps & detergents   Penicillins Rash    Tolerates ceftriaxone    Patient Measurements: Height: 5\' 4"  (162.6 cm) Weight: 71.5 kg (157 lb 10.1 oz) IBW/kg (Calculated) : 54.7 Heparin Dosing Weight: 66.1 kg  Vital Signs: Temp: 98.3 F (36.8 C) (06/24 1756) Temp Source: Oral (06/24 1756) BP: 119/65 (06/25 0400) Pulse Rate: 63 (06/25 0400)  Labs: Recent Labs    06/14/23 0621 06/14/23 0746 06/15/23 0413 06/15/23 1001 06/15/23 1002 06/15/23 2237 06/16/23 0013 06/16/23 0421  HGB 8.4*  --  8.0* 7.8* 8.5*  --   --   --   HCT 26.5*  --  25.6* 23.0* 25.0*  --   --   --   PLT 168  --  147*  --   --   --   --   --   HEPARINUNFRC  --  0.51 0.46  --   --   --   --  0.50  CREATININE  --   --  4.91*  --   --   --   --   --   TROPONINIHS  --   --   --   --   --  180* 209*  --      Estimated Creatinine Clearance: 8.6 mL/min (A) (by C-G formula based on SCr of 4.91 mg/dL (H)).   Medical History: Past Medical History:  Diagnosis Date   Anemia    Arthritis    low back,knees   Asthma    Basal cell carcinoma (BCC)    a. nose   CHF (congestive heart failure) (HCC)    a. prev eval by Dr. Katrinka Blazing in [place] - "many yrs ago."   CKD (chronic kidney disease), stage IV (HCC)    Depression    Diabetes mellitus without complication (HCC)    Dyspnea    Hypertension    Hypothyroidism     Medications:  Scheduled:   aspirin EC  81 mg Oral Daily   atorvastatin  80 mg Oral Daily   calcium acetate  1,334 mg Oral TID WC   cephALEXin  500 mg Oral Q12H   Chlorhexidine Gluconate Cloth  6 each Topical Q0600   FLUoxetine  10 mg Oral Once per day on Mon Wed Fri   furosemide  80 mg Oral BID   insulin aspart  0-6 Units Subcutaneous TID WC   levothyroxine  75 mcg Oral QAC  breakfast   metoprolol succinate  25 mg Oral Daily   multivitamin with minerals  1 tablet Oral Daily   sodium bicarbonate  650 mg Oral BID   sodium chloride flush  3 mL Intravenous Q12H   sodium chloride flush  3 mL Intravenous Q12H    Assessment: 82 yo female w/ PMH of  CAD with recent NSTEMI medically managed as outlined below, chronic combined systolic and diastolic CHF, ESRD, anemia of chronic disease, DM2, HTN, hypothyroidism, depression, asthma, and OA who was recently admitted to the hospital earlier this month with acute combined CHF and NSTEMI and left AMA who was readmitted with volume overload and who was seen by cardiology for the evaluation of torsades de pointes and ventricular bigeminy.    6/22 2350 HL 0.48  6/23 0746 HL 0.51.  6/24 0413 HL  0.46 6/25 0421 HL 0.50  Goal of Therapy:  Heparin level 0.3-0.7 units/ml Monitor platelets by anticoagulation protocol: Yes   Plan:  Heparin level is therapeutic.  Continue heparin infusion at 1100 units/hr.  Recheck heparin level and CBC with AM labs.   Barrie Folk, PharmD 06/16/2023,4:47 AM

## 2023-06-16 NOTE — Progress Notes (Signed)
Pt having more frequent bradycardic episodes, HR in mid to high 30s when asleep and in 40s when awake. Blood pressures also low with a trend down, systolics anywhere from 80s-90s with MAPs in 50s-60s. Dr. Excell Seltzer aware and at bedside.

## 2023-06-16 NOTE — Progress Notes (Signed)
2209 Reached out to Dr Para March regarding ST elevation on patients EKG. Orders placed to trend trops. Pt complaining of no pain or SOB  2309 DR Para March notified   about Patient blood pressure on soft side Maps starting to trend less then 65. Also notified Para March that trops came back at 180.  2330 Dr Para March at bedside   0520 Dr Para March notified of patient having frequent PVC and occasionally going into bigeminy PVC. Dr Para March added on to check magnesium and to give patients AM dose of Toprol early.

## 2023-06-16 NOTE — Discharge Summary (Addendum)
Physician Discharge Summary   Patient: Cassandra Baker MRN: [id number] DOB: 1941/04/08  Admit date:     06/11/2023  Discharge date: 06/16/23  Discharge Physician: Lurene Shadow   PCP: Renford Dills, MD   Recommendations at discharge:   Follow-up with Dr. Excell Seltzer, cardiologist, at [facility] within 24 hours of discharge  Discharge Diagnoses: Principal Problem:   ESRD (end stage renal disease) (HCC) Active Problems:   Non-ST elevation (NSTEMI) myocardial infarction (HCC)   Acute on chronic combined systolic and diastolic CHF (congestive heart failure) (HCC)   Acute systolic CHF (congestive heart failure) (HCC)   CAD (coronary artery disease)   Type 2 diabetes mellitus (HCC)   Hypothyroidism   Essential hypertension   Anxiety and depression   Pyuria   DCM (dilated cardiomyopathy) (HCC)   E. coli UTI   Polymorphic ventricular tachycardia (HCC)  Resolved Problems:   * No resolved hospital problems. [facility] Course:  Cassandra Baker is a 82 y.o. 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  She was recently admitted from 06/04/2023 through 06/07/2023 for NSTEMI complicated by acute on chronic systolic CHF, AKI on CKD stage IV.  At that time, she was treated with IV heparin drip and stress test showed old fixed defect.  Left heart catheter was deferred because of CKD.  Plan was to start patient on hemodialysis but she left AGAINST MEDICAL ADVICE.  She presented to the hospital again on 06/11/2023  bilateral leg swelling, fatigue, general weakness and shortness of breath.  She said she had been taking Lasix twice a day at home but her symptoms did not get any better.  She felt anxious and jittery.  She presented to the ED for further management.   She was admitted to the hospital for acute on chronic systolic  CHF and ESRD.  She was treated with IV Lasix.  Right femoral temporary dialysis catheter was placed by vascular surgeon on 06/12/2023 and hemodialysis was initiated on the same day.    Assessment and Plan:   ESRD (end stage renal disease) (HCC), anemia of chronic kidney disease Hemodialysis was initiated on 06/12/2023.  Erythropoietin stimulating agents on hold because of recent NSTEMI.  Follow-up with nephrologist.     Acute on chronic systolic and diastolic CHF (congestive heart failure) (HCC) Continue Lasix 2D echo on 06/04/2023 showed EF estimated at 30 to 35%, grade 1 diastolic dysfunction, moderate MR EF by cath estimated at 25 to 35%.    Arrhythmia/ polymorphic ventricular tachycardia and ventricular bigeminy Episode was very brief but symptomatic (lightheadedness and "blacked out") around 9:20 AM on 06/13/2023.[name] Kitchen Normal QTc.  Continue metoprolol succinate     CAD (coronary artery disease) Recently admitted for NSTEMI and stress test at that time demonstrated fixed ischemic defect.   S/p left heart cath on 06/15/2023 which showed severe three-vessel CAD.  Cardiologist recommended transfer to [facility] for evaluation for CABG Continue aspirin and metoprolol     Type 2 diabetes mellitus (HCC) Resume Januvia at discharge     Acute E. coli UTI Initially treated with IV ceftriaxone.  Completed Keflex on 06/16/2023.   Hyponatremia Probably from CHF and ESRD.     Other comorbidities include anemia of chronic disease, hypothyroidism, hypertension, anxiety, depression     Discharge plan was discussed with her son  Consultants: Cardiologist Procedures performed: Right and left cardiac catheterization on 06/15/2023 Placement of right femoral temporary dialysis catheter on 06/12/2023   Disposition:  Transfer to [facility] Diet recommendation:  Discharge Diet Orders (From admission, onward)     Start     Ordered   06/15/23 0000  Diet - low sodium  heart healthy        06/15/23 1449           Renal diet DISCHARGE MEDICATION: Allergies as of 06/16/2023       Reactions   Plum Pulp Anaphylaxis, Cough   Citrus    Coughing, chest tightness    Other Itching   Dairy products/Soaps & detergents   Penicillins Rash   Tolerates ceftriaxone        Medication List     TAKE these medications    acetaminophen 500 MG tablet Commonly known as: TYLENOL Take 500-1,000 mg by mouth every 6 (six) hours as needed (pain).   allopurinol 100 MG tablet Commonly known as: ZYLOPRIM Take 100 mg by mouth daily as needed.   aspirin EC 81 MG tablet Take 1 tablet (81 mg total) by mouth daily. Swallow whole.   atorvastatin 80 MG tablet Commonly known as: LIPITOR Take 1 tablet (80 mg total) by mouth daily.   calcitRIOL 0.5 MCG capsule Commonly known as: ROCALTROL Take 0.5 mcg by mouth daily.   calcium acetate 667 MG tablet Commonly known as: PHOSLO Take 667 mg by mouth 3 (three) times daily.   FLUoxetine 10 MG capsule Commonly known as: PROZAC Take 10 mg by mouth 3 (three) times a week. At night   furosemide 40 MG tablet Commonly known as: LASIX Take 80 mg by mouth 2 (two) times daily.   hydrOXYzine 10 MG tablet Commonly known as: ATARAX Take 10 mg by mouth at bedtime as needed for itching.   levothyroxine 75 MCG tablet Commonly known as: SYNTHROID Take 75 mcg by mouth daily before breakfast.   loratadine 5 MG chewable tablet Commonly known as: CLARITIN Chew 5 mg by mouth daily as needed for allergies.   metoprolol succinate 25 MG 24 hr tablet Commonly known as: TOPROL-XL Take 1 tablet (25 mg total) by mouth daily. Take with or immediately following a meal. Start taking on: June 17, 2023   sitaGLIPtin 25 MG tablet Commonly known as: JANUVIA Take 25 mg by mouth daily.   sodium bicarbonate 650 MG tablet Take 650 mg by mouth 2 (two) times daily.        Discharge Exam: Filed Weights   06/15/23 1623 06/15/23 1810  06/16/23 0422  Weight: 66.7 kg 66.7 kg 71.5 kg   GEN: NAD SKIN: Warm and dry EYES: EOMI ENT: MMM CV: RRR PULM: CTA B ABD: soft, ND, NT, +BS CNS: AAO x 3, non focal EXT: Leg and pedal edema have improved. No tenderness   Condition at discharge: stable  The results of significant diagnostics from this hospitalization (including imaging, microbiology, ancillary and laboratory) are listed below for reference.   Imaging Studies: CARDIAC CATHETERIZATION  Result Date: 06/15/2023   Mid LAD lesion is 80% stenosed.   Mid Cx to Dist Cx lesion is 90% stenosed.   Ramus lesion is 60% stenosed.   Prox RCA to Mid RCA lesion is 95% stenosed.   Mid RCA to Dist RCA lesion is 60% stenosed.   RPDA lesion is 90% stenosed.   RPAV lesion is 80% stenosed.   There is moderate to severe left ventricular systolic dysfunction.  LV end diastolic pressure is mildly elevated.   The left ventricular ejection fraction is 25-35% by visual estimate. 1.  Severe three-vessel coronary artery disease.  The culprit for myocardial infarction is likely subtotal occlusion of the right coronary artery which is heavily calcified and diffusely diseased in the proximal/mid segment.  In addition, there is complex bifurcation disease at the right PDA and right posterior AV groove artery. 2.  Mildly with severely reduced LV systolic function with an EF of 30% with severe inferior and apical hypokinesis. 3.  Right heart catheterization showed mildly elevated filling pressures, mild pulmonary hypertension and normal cardiac output.  No significant V waves noted on wedge tracing. Recommendations: Difficult revascularization options.  The patient has surgical disease but she might not be a candidate for CABG.  The RCA is the culprit but it is diffusely diseased and heavily calcified.  Will discuss with patient and family and consider transfer to [facility] for evaluation.  If we decide to PCI, it is preferable to transfuse her to get her  hemoglobin above 9 and load with an antiplatelet medication.   PERIPHERAL VASCULAR CATHETERIZATION  Result Date: 06/12/2023 See surgical note for result.  DG Chest 2 View  Result Date: 06/11/2023 CLINICAL DATA:  SOB, eval edema, new onset ESRD EXAM: CHEST - 2 VIEW COMPARISON:  June 04, 2023 FINDINGS: The cardiomediastinal silhouette is unchanged in contour.Mild bronchial cuffing with interstitial prominence. No pleural effusion. No pneumothorax. Visualized abdomen is unremarkable. Multilevel degenerative changes of the thoracic spine. IMPRESSION: Constellation of findings are favored to reflect mild underlying pulmonary edema. Electronically Signed   By: Meda Klinefelter M.D.   On: 06/11/2023 16:22   NM Myocar Multi W/Spect W/Wall Motion / EF  Result Date: 06/05/2023 Pharmacological myocardial perfusion imaging study with no significant  ischemia Large fixed defect in the inferior and inferolateral wall consistent with prior MI Hypokinesis of the inferior and inferolateral wall, EF estimated at 31% EKG changes concerning for ischemia at peak stress/recovery (diffuse ST elevations in the inferior and anterolateral leads V3 through V6. CT attenuation correction images with mild diffuse aortic atherosclerosis, coronary calcification, predominantly in the RCA High risk scan in the setting of large infarct Signed, Dossie Arbour, MD, Ph.D [facility]   US RENAL  Result Date: 06/05/2023 CLINICAL DATA:  Acute renal injury EXAM: RENAL / URINARY TRACT ULTRASOUND COMPLETE COMPARISON:  None Available. FINDINGS: Right Kidney: Length = 7.7 cm Renal cortical thinning. Diffusely increased cortical echogenicity with preserved corticomedullary differentiation which can be seen with medical renal disease. No urinary tract dilation or shadowing calculi. The ureter is not seen. Left Kidney: Length = 8.3 cm Renal cortical thinning. Diffusely increased cortical echogenicity with diminished corticomedullary  differentiation which can be seen with medical renal disease. Interpolar simple-appearing cyst measures 10 mm. No urinary tract dilation or shadowing calculi. The ureter is not seen. Bladder: Appears normal for degree of bladder distention. Other: None. IMPRESSION: 1. No urinary tract dilation or shadowing calculi. 2. Bilateral renal cortical thinning and increased cortical echogenicity which can be seen with medical renal disease. Electronically Signed   By: Agustin Cree M.D.   On: 06/05/2023 09:56   ECHOCARDIOGRAM COMPLETE  Result Date: 06/04/2023    ECHOCARDIOGRAM REPORT   Patient Name:   Cassandra Baker Date of Exam: 06/04/2023 Medical Rec #:  [id number]      Height:       64.0 in Accession #:    [id number]     Weight:  148.0 lb Date of Birth:  30-Mar-1941      BSA:          1.721 m Patient Age:    81 years       BP:           124/74 mmHg Patient Gender: F              HR:           97 bpm. Exam Location:  [facility] Procedure: 2D Echo, Cardiac Doppler, Color Doppler and Strain Analysis Indications:     CHF, NSTEMI  History:         Patient has no prior history of Echocardiogram examinations.                  CHF, Acute MI; Risk Factors:Diabetes. CKD.  Sonographer:     Mikki Harbor Referring Phys:  [id number] JAN A MANSY Diagnosing Phys: Julien Nordmann MD  Sonographer Comments: Global longitudinal strain was attempted. IMPRESSIONS  1. Left ventricular ejection fraction, by estimation, is 30 to 35%. The left ventricle has moderately decreased function. The left ventricle demonstrates mild global hypokinesis with severe hypokinesis of the inferior/posterior/septal wall. There is mild left ventricular hypertrophy. Left ventricular diastolic parameters are consistent with Grade I diastolic dysfunction (impaired relaxation).  2. Right ventricular systolic function is normal. The right ventricular size is normal.  3. The mitral valve is normal in structure. Moderate mitral valve regurgitation. No evidence of mitral  stenosis. Moderate mitral annular calcification.  4. The aortic valve is normal in structure. Aortic valve regurgitation is not visualized. No aortic stenosis is present. Aortic valve mean gradient measures 8.0 mmHg.  5. The inferior vena cava is normal in size with greater than 50% respiratory variability, suggesting right atrial pressure of 3 mmHg. FINDINGS  Left Ventricle: Left ventricular ejection fraction, by estimation, is 30 to 35%. The left ventricle has moderately decreased function. The left ventricle demonstrates global hypokinesis. The average left ventricular global longitudinal strain is -4.4 %.  The left ventricular internal cavity size was normal in size. There is mild left ventricular hypertrophy. Left ventricular diastolic parameters are consistent with Grade I diastolic dysfunction (impaired relaxation). Right Ventricle: The right ventricular size is normal. No increase in right ventricular wall thickness. Right ventricular systolic function is normal. Left Atrium: Left atrial size was normal in size. Right Atrium: Right atrial size was normal in size. Pericardium: There is no evidence of pericardial effusion. Mitral Valve: The mitral valve is normal in structure. Moderate mitral annular calcification. Moderate mitral valve regurgitation. No evidence of mitral valve stenosis. MV peak gradient, 5.0 mmHg. The mean mitral valve gradient is 2.0 mmHg. Tricuspid Valve: The tricuspid valve is normal in structure. Tricuspid valve regurgitation is not demonstrated. No evidence of tricuspid stenosis. Aortic Valve: The aortic valve is normal in structure. Aortic valve regurgitation is not visualized. No aortic stenosis is present. Aortic valve mean gradient measures 8.0 mmHg. Aortic valve peak gradient measures 13.1 mmHg. Aortic valve area, by VTI measures 1.57 cm. Pulmonic Valve: The pulmonic valve was normal in structure. Pulmonic valve regurgitation is not visualized. No evidence of pulmonic stenosis.  Aorta: The aortic root is normal in size and structure. Venous: The inferior vena cava is normal in size with greater than 50% respiratory variability, suggesting right atrial pressure of 3 mmHg. IAS/Shunts: The interatrial septum appears to be lipomatous. No atrial level shunt detected by color flow Doppler.  LEFT VENTRICLE PLAX 2D LVIDd:  4.90 cm     Diastology LVIDs:         4.20 cm     LV e' medial:    5.44 cm/s LV PW:         1.50 cm     LV E/e' medial:  21.1 LV IVS:        1.10 cm     LV e' lateral:   6.96 cm/s LVOT diam:     2.00 cm     LV E/e' lateral: 16.5 LV SV:         63 LV SV Index:   37          2D Longitudinal Strain LVOT Area:     3.14 cm    2D Strain GLS Avg:     -4.4 %  LV Volumes (MOD) LV vol d, MOD A2C: 85.3 ml LV vol d, MOD A4C: 73.8 ml LV vol s, MOD A2C: 48.3 ml LV vol s, MOD A4C: 37.4 ml LV SV MOD A2C:     37.0 ml LV SV MOD A4C:     73.8 ml LV SV MOD BP:      38.6 ml RIGHT VENTRICLE RV Basal diam:  3.25 cm RV Mid diam:    2.50 cm RV S prime:     13.10 cm/s TAPSE (M-mode): 2.7 cm LEFT ATRIUM             Index        RIGHT ATRIUM           Index LA diam:        3.90 cm 2.27 cm/m   RA Area:     12.70 cm LA Vol (A2C):   41.4 ml 24.05 ml/m  RA Volume:   31.80 ml  18.47 ml/m LA Vol (A4C):   58.4 ml 33.93 ml/m LA Biplane Vol: 49.8 ml 28.93 ml/m  AORTIC VALVE                     PULMONIC VALVE AV Area (Vmax):    1.69 cm      PV Vmax:       1.17 m/s AV Area (Vmean):   1.37 cm      PV Peak grad:  5.5 mmHg AV Area (VTI):     1.57 cm AV Vmax:           181.00 cm/s AV Vmean:          139.000 cm/s AV VTI:            0.405 m AV Peak Grad:      13.1 mmHg AV Mean Grad:      8.0 mmHg LVOT Vmax:         97.40 cm/s LVOT Vmean:        60.600 cm/s LVOT VTI:          0.202 m LVOT/AV VTI ratio: 0.50  AORTA Ao Root diam: 2.80 cm MITRAL VALVE MV Area (PHT): 5.16 cm       SHUNTS MV Area VTI:   2.11 cm       Systemic VTI:  0.20 m MV Peak grad:  5.0 mmHg       Systemic Diam: 2.00 cm MV Mean grad:  2.0  mmHg MV Vmax:       1.12 m/s MV Vmean:      63.1 cm/s MV Decel Time: 147 msec MR Peak grad:    129.0 mmHg MR Mean grad:  88.0 mmHg MR Vmax:         568.00 cm/s MR Vmean:        441.0 cm/s MR PISA:         1.01 cm MR PISA Eff ROA: 16 mm MR PISA Radius:  0.40 cm MV E velocity: 115.00 cm/s MV A velocity: 125.00 cm/s MV E/A ratio:  0.92 Julien Nordmann MD Electronically signed by Julien Nordmann MD Signature Date/Time: 06/04/2023/4:58:01 PM    Final    DG Chest 1 View  Result Date: 06/04/2023 CLINICAL DATA:  Shortness of breath EXAM: CHEST  1 VIEW COMPARISON:  03/11/2018 FINDINGS: There is hyperinflation of the lungs compatible with COPD. Increased markings in the lung bases. No effusions or pneumothorax. Heart mediastinal contours within normal limits. Aortic atherosclerosis. IMPRESSION: COPD. Increased markings in the lung bases could reflect atelectasis or scarring. Electronically Signed   By: Charlett Nose M.D.   On: 06/04/2023 00:34    Microbiology: Results for orders placed or performed during the hospital encounter of 06/11/23  Urine Culture     Status: Abnormal   Collection Time: 06/11/23  2:25 PM   Specimen: Urine, Clean Catch  Result Value Ref Range Status   Specimen Description   Final    URINE, CLEAN CATCH Performed at [facility], [address], [place], [place] [address]    Special Requests   Final    NONE Performed at [facility], [address] [place], [place] [address]    Culture 70,000 COLONIES/mL ESCHERICHIA COLI (A)  Final   Report Status 06/13/2023 FINAL  Final   Organism ID, Bacteria ESCHERICHIA COLI (A)  Final      Susceptibility   Escherichia coli - MIC*    AMPICILLIN >=32 RESISTANT Resistant     CEFAZOLIN <=4 SENSITIVE Sensitive     CEFEPIME <=0.12 SENSITIVE Sensitive     CEFTRIAXONE <=0.25 SENSITIVE Sensitive     CIPROFLOXACIN >=4 RESISTANT Resistant     GENTAMICIN <=1 SENSITIVE Sensitive     IMIPENEM <=0.25 SENSITIVE Sensitive      NITROFURANTOIN <=16 SENSITIVE Sensitive     TRIMETH/SULFA >=320 RESISTANT Resistant     AMPICILLIN/SULBACTAM 8 SENSITIVE Sensitive     PIP/TAZO <=4 SENSITIVE Sensitive     * 70,000 COLONIES/mL ESCHERICHIA COLI  MRSA Next Gen by PCR, Nasal     Status: None   Collection Time: 06/13/23  1:03 PM   Specimen: Nasal Mucosa; Nasal Swab  Result Value Ref Range Status   MRSA by PCR Next Gen NOT DETECTED NOT DETECTED Final    Comment: (NOTE) The GeneXpert MRSA Assay (FDA approved for NASAL specimens only), is one component of a comprehensive MRSA colonization surveillance program. It is not intended to diagnose MRSA infection nor to guide or monitor treatment for MRSA infections. Test performance is not FDA approved in patients less than 104 years old. Performed at [facility], [address] [place], [place] [address]     Labs: CBC: Recent Labs  Lab 06/12/23 0413 06/13/23 0433 06/14/23 0621 06/15/23 0413 06/15/23 1001 06/15/23 1002 06/16/23 0421  WBC 8.1 8.5 9.4 6.7  --   --  8.4  NEUTROABS  --  6.7  --   --   --   --   --   HGB 7.8* 8.7* 8.4* 8.0* 7.8* 8.5* 7.8*  HCT 24.7* 27.6* 26.5* 25.6* 23.0* 25.0* 25.0*  MCV 95.7 96.2 95.7 95.9  --   --  96.5  PLT 179 175 168  147*  --   --  142*   Basic Metabolic Panel: Recent Labs  Lab 06/11/23 1322 06/12/23 0413 06/13/23 0433 06/15/23 0413 06/15/23 1001 06/15/23 1002 06/16/23 0420 06/16/23 0421  NA 131* 133* 133* 125* 124* 123*  --  126*  K 5.1 4.7 4.8 4.1 4.3 4.4  --  4.4  CL 95* 100 96* 90*  --   --   --  91*  CO2 21* 20* 24 21*  --   --   --  25  GLUCOSE 146* 108* 132* 134*  --   --   --  120*  BUN 97* 106* 70* 72*  --   --   --  59*  CREATININE 6.45* 6.42* 4.68* 4.91*  --   --   --  4.38*  CALCIUM 9.7 9.3 9.4 9.3  --   --   --  9.0  MG  --   --  2.5*  --   --   --  2.2  --   PHOS  --   --  8.0*  --   --   --   --   --    Liver Function Tests: Recent Labs  Lab 06/11/23 1322 06/13/23 0433  AST 15  --   ALT  28  --   ALKPHOS 106  --   BILITOT 0.6  --   PROT 6.6  --   ALBUMIN 3.7 3.4*   CBG: Recent Labs  Lab 06/15/23 0843 06/15/23 1042 06/15/23 1155 06/15/23 2058 06/16/23 0725  GLUCAP 122* 118* 115* 216* 106*    Discharge time spent: greater than 30 minutes.  Signed: Lurene Shadow, MD [facility] 06/16/2023

## 2023-06-16 NOTE — Progress Notes (Signed)
   Pt has pain in Rt neck.  Her son tells me she had crick in her neck on ambulance ride.  No chest pain no nausea.  No diaphoresis.   EKG actually improved.  BP is 114 systolic and HR stable.   Plan to transfuse with dialysis or if no dialysis then transfuse in anticipation of PCI tomorrow.   Nada Boozer, FNP-C At [facility]  Pgr:[phone] or after 5pm and on weekends call [phone] 06/16/2023

## 2023-06-16 NOTE — Progress Notes (Signed)
Pt left the unit with care-link, she is going to [facility] room 22. Reports called to RN

## 2023-06-16 NOTE — Progress Notes (Addendum)
Rounding Note    Patient Name: Cassandra Baker Date of Encounter: 06/16/2023   [facility] Cardiologist: Julien Nordmann, MD   Subjective   No chest pain or palpitations.  Mild shortness of breath/wheezing this morning, though this has resolved.  Still awaiting transfer to [facility] for further evaluation/management of severe three-vessel CAD with heavy calcification (especially involving RCA).  Patient not sure if she would want to undergo any intervention to her heart.  Inpatient Medications    Scheduled Meds:  aspirin EC  81 mg Oral Daily   atorvastatin  80 mg Oral Daily   calcium acetate  1,334 mg Oral TID WC   cephALEXin  500 mg Oral Q12H   Chlorhexidine Gluconate Cloth  6 each Topical Q0600   FLUoxetine  10 mg Oral Once per day on Mon Wed Fri   furosemide  80 mg Oral BID   insulin aspart  0-6 Units Subcutaneous TID WC   levothyroxine  75 mcg Oral QAC breakfast   metoprolol succinate  25 mg Oral Daily   multivitamin with minerals  1 tablet Oral Daily   sodium bicarbonate  650 mg Oral BID   sodium chloride flush  3 mL Intravenous Q12H   sodium chloride flush  3 mL Intravenous Q12H   Continuous Infusions:  sodium chloride     amiodarone     amiodarone     anticoagulant sodium citrate     heparin 1,100 Units/hr (06/16/23 0654)   PRN Meds: sodium chloride, acetaminophen **OR** acetaminophen, alteplase, anticoagulant sodium citrate, busPIRone, heparin, lidocaine (PF), lidocaine-prilocaine, methocarbamol, ondansetron **OR** ondansetron (ZOFRAN) IV, pentafluoroprop-tetrafluoroeth, sodium chloride flush   Vital Signs    Vitals:   06/16/23 0400 06/16/23 0422 06/16/23 0430 06/16/23 0600  BP: 119/65  137/64 113/83  Pulse: 63  75 73  Resp: (!) 21  16 (!) 23  Temp:      TempSrc:      SpO2: 93%  100% 100%  Weight:  71.5 kg    Height:        Intake/Output Summary (Last 24 hours) at 06/16/2023 1001 Last data filed at 06/16/2023 0654 Gross per 24 hour  Intake  196.19 ml  Output 575 ml  Net -378.81 ml      06/16/2023    4:22 AM 06/15/2023    6:10 PM 06/15/2023    4:23 PM  Last 3 Weights  Weight (lbs) 157 lb 10.1 oz 147 lb 0.8 oz 147 lb 0.8 oz  Weight (kg) 71.5 kg 66.7 kg 66.7 kg      Telemetry    Normal sinus rhythm with frequent PVC's and multiple runs of NSVT of up to 18 beats - Personally Reviewed  ECG    Normal sinus rhythm with inferolateral T wave inversions and nonspecific ST changes - Personally Reviewed  Physical Exam   GEN: No acute distress.   Neck: No JVD Cardiac: RRR with 1/6 systolic murmur Respiratory: Mildly diminished breath sounds throughout GI: Soft, nontender, non-distended  MS: Trace pretibial edema.  Right groin temporary HD catheter in place.  Left groin with clean dressing in place.  No left groin hematoma Neuro:  Nonfocal  Psych: Flat affect  Labs    High Sensitivity Troponin:   Recent Labs  Lab 06/03/23 2353 06/04/23 0145 06/05/23 1421 06/15/23 2237 06/16/23 0013  TROPONINIHS 986* 998* 993* 180* 209*     Chemistry Recent Labs  Lab 06/11/23 1322 06/12/23 0413 06/13/23 0433 06/15/23 0413 06/15/23 1001 06/15/23 1002 06/16/23 0420  06/16/23 0421  NA 131*   < > 133* 125* 124* 123*  --  126*  K 5.1   < > 4.8 4.1 4.3 4.4  --  4.4  CL 95*   < > 96* 90*  --   --   --  91*  CO2 21*   < > 24 21*  --   --   --  25  GLUCOSE 146*   < > 132* 134*  --   --   --  120*  BUN 97*   < > 70* 72*  --   --   --  59*  CREATININE 6.45*   < > 4.68* 4.91*  --   --   --  4.38*  CALCIUM 9.7   < > 9.4 9.3  --   --   --  9.0  MG  --   --  2.5*  --   --   --  2.2  --   PROT 6.6  --   --   --   --   --   --   --   ALBUMIN 3.7  --  3.4*  --   --   --   --   --   AST 15  --   --   --   --   --   --   --   ALT 28  --   --   --   --   --   --   --   ALKPHOS 106  --   --   --   --   --   --   --   BILITOT 0.6  --   --   --   --   --   --   --   GFRNONAA 6*   < > 9* 8*  --   --   --  10*  ANIONGAP 15   < > 13 14  --    --   --  10   < > = values in this interval not displayed.    Lipids No results for input(s): "CHOL", "TRIG", "HDL", "LABVLDL", "LDLCALC", "CHOLHDL" in the last 168 hours.  Hematology Recent Labs  Lab 06/14/23 0621 06/15/23 0413 06/15/23 1001 06/15/23 1002 06/16/23 0421  WBC 9.4 6.7  --   --  8.4  RBC 2.77* 2.67*  --   --  2.59*  HGB 8.4* 8.0* 7.8* 8.5* 7.8*  HCT 26.5* 25.6* 23.0* 25.0* 25.0*  MCV 95.7 95.9  --   --  96.5  MCH 30.3 30.0  --   --  30.1  MCHC 31.7 31.3  --   --  31.2  RDW 13.2 13.2  --   --  13.2  PLT 168 147*  --   --  142*   Thyroid No results for input(s): "TSH", "FREET4" in the last 168 hours.  BNP Recent Labs  Lab 06/11/23 1322  BNP 2,338.2*    DDimer No results for input(s): "DDIMER" in the last 168 hours.   Radiology    CARDIAC CATHETERIZATION  Result Date: 06/15/2023   Mid LAD lesion is 80% stenosed.   Mid Cx to Dist Cx lesion is 90% stenosed.   Ramus lesion is 60% stenosed.   Prox RCA to Mid RCA lesion is 95% stenosed.   Mid RCA to Dist RCA lesion is 60% stenosed.   RPDA lesion is 90% stenosed.   RPAV lesion is 80% stenosed.  There is moderate to severe left ventricular systolic dysfunction.   LV Kemper Heupel diastolic pressure is mildly elevated.   The left ventricular ejection fraction is 25-35% by visual estimate. 1.  Severe three-vessel coronary artery disease.  The culprit for myocardial infarction is likely subtotal occlusion of the right coronary artery which is heavily calcified and diffusely diseased in the proximal/mid segment.  In addition, there is complex bifurcation disease at the right PDA and right posterior AV groove artery. 2.  Mildly with severely reduced LV systolic function with an EF of 30% with severe inferior and apical hypokinesis. 3.  Right heart catheterization showed mildly elevated filling pressures, mild pulmonary hypertension and normal cardiac output.  No significant V waves noted on wedge tracing. Recommendations: Difficult  revascularization options.  The patient has surgical disease but she might not be a candidate for CABG.  The RCA is the culprit but it is diffusely diseased and heavily calcified.  Will discuss with patient and family and consider transfer to [facility] for evaluation.  If we decide to PCI, it is preferable to transfuse her to get her hemoglobin above 9 and load with an antiplatelet medication.    Cardiac Studies   See cath above.  TTE (06/04/2023):  1. Left ventricular ejection fraction, by estimation, is 30 to 35%. The  left ventricle has moderately decreased function. The left ventricle  demonstrates mild global hypokinesis with severe hypokinesis of the  inferior/posterior/septal wall. There is  mild left ventricular hypertrophy. Left ventricular diastolic parameters  are consistent with Grade I diastolic dysfunction (impaired relaxation).   2. Right ventricular systolic function is normal. The right ventricular  size is normal.   3. The mitral valve is normal in structure. Moderate mitral valve  regurgitation. No evidence of mitral stenosis. Moderate mitral annular  calcification.   4. The aortic valve is normal in structure. Aortic valve regurgitation is  not visualized. No aortic stenosis is present. Aortic valve mean gradient  measures 8.0 mmHg.   5. The inferior vena cava is normal in size with greater than 50%  respiratory variability, suggesting right atrial pressure of 3 mmHg.   Patient Profile     82 y.o. female with history of CAD with recent NSTEMI medically managed as outlined below, chronic combined systolic and diastolic CHF, ESRD, anemia of chronic disease, DM2, HTN, hypothyroidism, depression, asthma, and OA who was recently admitted to the hospital earlier this month with acute combined CHF and NSTEMI and left AMA who was readmitted with volume overload and who is being seen for polymorphic ventricular tachycardia and NSTEMI.   Assessment & Plan     Polymorphic VT: Frequent PVC's and episodes of NSVT noted on telemetry over last 24 hours.  Most likely driving by ischemia.  BP precludes escalation of metoprolol at this time.  QTc borderline prolonged. -Continue current dose of metoprolol. -Case reviewed with Dr. Graciela Husbands; will initiate amiodarone infusion. -Repeat EKG this afternoon to reasses QT interval. -Avoid QT-prolonging drugs.  NSTEMI: Severe three-vessel CAD noted on yesterday's cath.  Patient without chest pain. -Await transfer to [facility] for cardiac surgery consultation; if patient is not a candidate for CABG (she is skeptical about CABG herself), will need to consider high-risk PCI to RCA +/- LAD versus hospice. -Continue aspirin and heparin; defer P2Y12 inhibitor pending TCTS consultation. -Continue atovastatin. -Recommend PRBC transfusion for goal HGB > 8.  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 -Defer  addition of ACE inhibitor/ARB with soft blood pressure. -Defer aldosterone antagonist and SGLT2 inhibitor with Kambrea Carrasco-stage renal disease.  Rayon Mcchristian-stage renal disease: -Ongoing management per nephrology. -Recommend PRBC transfusion with dialysis today for target hemoglobin greater than 8.    ADDENDUM (06/16/23 3:46 PM): I was notified by the cardiac surgery team that the patient is not a candidate for CABG.  I have spoken with Ms. Cerrato as well as her son and granddaughter regarding this and further treatment options.  These include multivessel PCI that may require lithotripsy/atherectomy, versus palliative medical therapy.  They are willing to pursue PCI.  Transfer is currently pending to [facility] for possible intervention as soon as tomorrow.  Patient has not been dialyzed at [facility] yet today and will need HD later today or first thing tomorrow morning as well as 1 unit of PRBCs with hemodialysis.  I will load Ms. Su Hilt with  clopidogrel 300 mg x 1 followed by 75 mg daily thereafter.  For questions or updates, please contact [facility] Please consult [email or web address] for contact info under [facility].    Signed, Yvonne Kendall, MD  06/16/2023, 10:01 AM

## 2023-06-16 NOTE — Discharge Planning (Signed)
PLACEMENT RESOLVED: Outpatient Facility  [facility] [address] [place], [place] [address] [phone]  Schedule: TTS 11:30am

## 2023-06-16 NOTE — Progress Notes (Signed)
[facility]  PROGRESS NOTE   Subjective:   Cassandra Baker  is a 82 y.o. with past medical history  of osteoarthritis, asthma, CHF, advanced CKD, depression, diabetes, hypertension, and hypothyroidism. Patient presents to the ED for lower extremity edema and has been admitted for ESRD (end stage renal disease) (HCC) [N18.6] Other hypervolemia [E87.79]   Son at bedside.   Objective:  Vital signs: Blood pressure 113/83, pulse 73, temperature 98.3 F (36.8 C), temperature source Oral, resp. rate (!) 23, height 5\' 4"  (1.626 m), weight 71.5 kg, SpO2 100 %.  Intake/Output Summary (Last 24 hours) at 06/16/2023 0953 Last data filed at 06/16/2023 0654 Gross per 24 hour  Intake 196.19 ml  Output 575 ml  Net -378.81 ml    Filed Weights   06/15/23 1623 06/15/23 1810 06/16/23 0422  Weight: 66.7 kg 66.7 kg 71.5 kg     Physical Exam: General:  No acute distress  Head:  Normocephalic, atraumatic. Moist oral mucosal membranes  Eyes:  Anicteric  Lungs:   Clear to auscultation, normal effort  Heart:  regular  Abdomen:   Soft, nontender, bowel sounds present  Extremities:  1+ peripheral edema.  Neurologic:  Awake, alert, following commands  Skin:  No lesions  Access:  Right femoral temp HD catheter 6/21 Dr. Gilda Crease    Basic Metabolic Panel: Recent Labs  Lab 06/11/23 1322 06/12/23 0413 06/13/23 0433 06/15/23 0413 06/15/23 1001 06/15/23 1002 06/16/23 0420 06/16/23 0421  NA 131* 133* 133* 125* 124* 123*  --  126*  K 5.1 4.7 4.8 4.1 4.3 4.4  --  4.4  CL 95* 100 96* 90*  --   --   --  91*  CO2 21* 20* 24 21*  --   --   --  25  GLUCOSE 146* 108* 132* 134*  --   --   --  120*  BUN 97* 106* 70* 72*  --   --   --  59*  CREATININE 6.45* 6.42* 4.68* 4.91*  --   --   --  4.38*  CALCIUM 9.7 9.3 9.4 9.3  --   --   --  9.0  MG  --   --  2.5*  --   --   --  2.2  --   PHOS  --   --  8.0*  --   --   --   --   --     GFR: Estimated Creatinine Clearance: 9.6 mL/min (A) (by C-G  formula based on SCr of 4.38 mg/dL (H)).  Liver Function Tests: Recent Labs  Lab 06/11/23 1322 06/13/23 0433  AST 15  --   ALT 28  --   ALKPHOS 106  --   BILITOT 0.6  --   PROT 6.6  --   ALBUMIN 3.7 3.4*    No results for input(s): "LIPASE", "AMYLASE" in the last 168 hours. No results for input(s): "AMMONIA" in the last 168 hours.  CBC: Recent Labs  Lab 06/12/23 0413 06/13/23 0433 06/14/23 0621 06/15/23 0413 06/15/23 1001 06/15/23 1002 06/16/23 0421  WBC 8.1 8.5 9.4 6.7  --   --  8.4  NEUTROABS  --  6.7  --   --   --   --   --   HGB 7.8* 8.7* 8.4* 8.0* 7.8* 8.5* 7.8*  HCT 24.7* 27.6* 26.5* 25.6* 23.0* 25.0* 25.0*  MCV 95.7 96.2 95.7 95.9  --   --  96.5  PLT 179 175 168 147*  --   --  142*      HbA1C: Hgb A1c MFr Bld  Date/Time Value Ref Range Status  06/04/2023 04:08 AM 6.4 (H) 4.8 - 5.6 % Final    Comment:    (NOTE) Pre diabetes:          5.7%-6.4%  Diabetes:              >6.4%  Glycemic control for   <7.0% adults with diabetes   02/04/2018 11:04 AM 6.9 (H) 4.8 - 5.6 % Final    Comment:    (NOTE) Pre diabetes:          5.7%-6.4% Diabetes:              >6.4% Glycemic control for   <7.0% adults with diabetes     Urinalysis: No results for input(s): "COLORURINE", "LABSPEC", "PHURINE", "GLUCOSEU", "HGBUR", "BILIRUBINUR", "KETONESUR", "PROTEINUR", "UROBILINOGEN", "NITRITE", "LEUKOCYTESUR" in the last 72 hours.  Invalid input(s): "APPERANCEUR"     Imaging: CARDIAC CATHETERIZATION  Result Date: 06/15/2023   Mid LAD lesion is 80% stenosed.   Mid Cx to Dist Cx lesion is 90% stenosed.   Ramus lesion is 60% stenosed.   Prox RCA to Mid RCA lesion is 95% stenosed.   Mid RCA to Dist RCA lesion is 60% stenosed.   RPDA lesion is 90% stenosed.   RPAV lesion is 80% stenosed.   There is moderate to severe left ventricular systolic dysfunction.   LV end diastolic pressure is mildly elevated.   The left ventricular ejection fraction is 25-35% by visual estimate. 1.   Severe three-vessel coronary artery disease.  The culprit for myocardial infarction is likely subtotal occlusion of the right coronary artery which is heavily calcified and diffusely diseased in the proximal/mid segment.  In addition, there is complex bifurcation disease at the right PDA and right posterior AV groove artery. 2.  Mildly with severely reduced LV systolic function with an EF of 30% with severe inferior and apical hypokinesis. 3.  Right heart catheterization showed mildly elevated filling pressures, mild pulmonary hypertension and normal cardiac output.  No significant V waves noted on wedge tracing. Recommendations: Difficult revascularization options.  The patient has surgical disease but she might not be a candidate for CABG.  The RCA is the culprit but it is diffusely diseased and heavily calcified.  Will discuss with patient and family and consider transfer to [facility] for evaluation.  If we decide to PCI, it is preferable to transfuse her to get her hemoglobin above 9 and load with an antiplatelet medication.     Medications:    sodium chloride     anticoagulant sodium citrate     heparin 1,100 Units/hr (06/16/23 0654)    aspirin EC  81 mg Oral Daily   atorvastatin  80 mg Oral Daily   calcium acetate  1,334 mg Oral TID WC   cephALEXin  500 mg Oral Q12H   Chlorhexidine Gluconate Cloth  6 each Topical Q0600   FLUoxetine  10 mg Oral Once per day on Mon Wed Fri   furosemide  80 mg Oral BID   insulin aspart  0-6 Units Subcutaneous TID WC   levothyroxine  75 mcg Oral QAC breakfast   metoprolol succinate  25 mg Oral Daily   multivitamin with minerals  1 tablet Oral Daily   sodium bicarbonate  650 mg Oral BID   sodium chloride flush  3 mL Intravenous Q12H   sodium chloride flush  3 mL Intravenous Q12H    Assessment/  Plan:     Cassandra Baker is a 82 y.o. female with medical problems of osteoarthritis, asthma, CHF, advanced CKD, depression, diabetes, hypertension,  hypothyroidism was admitted on 06/11/2023 for ESRD (end stage renal disease) (HCC) [N18.6] Other hypervolemia [E87.79]   Found to have 3 vessel coronary artery disease. Plan to transfer to [facility] for CABG evaluation.    #1: End stage renal disease requiring dialysis. New start this admission.   - bedside hemodialysis for later today.  - outpatient dialysis planning. Patient prefers MWF schedule.   #2: Hypertension with end stage renal disease and Congestive heart failure: ECHO form 06/04/23 shows EF 30-35% with mild global hypokinesis and a grade 1 diastolic dysfunction. Acute exacerbation on admission.   - Continue metoprolol and furosemide.   - appreciate cardiology inpu   #3: Anemia with chronic kidney disease. - Holding ESA due to concerns for cardiac ischemia.     #4: Secondary Hyperparathyroidism: with hyperphosphatemia Lab Results  Component Value Date   CALCIUM 9.0 06/16/2023   CAION 1.20 06/15/2023   PHOS 8.0 (H) 06/13/2023    Patient was prescribed calcitriol and calcium acetate outpatient.  - calcium acetate with meals.       LOS: 5 [facility] 6/25/20249:53 AM

## 2023-06-17 ENCOUNTER — Encounter (HOSPITAL_COMMUNITY)
Admission: AD | Disposition: A | Discharge status: Home / Self Care | Payer: Self-pay | Source: Other Acute Inpatient Hospital | Attending: Cardiovascular Disease

## 2023-06-17 DIAGNOSIS — Z955 Presence of coronary angioplasty implant and graft: Secondary | ICD-10-CM

## 2023-06-17 DIAGNOSIS — I2510 Atherosclerotic heart disease of native coronary artery without angina pectoris: Secondary | ICD-10-CM | POA: Diagnosis not present

## 2023-06-17 DIAGNOSIS — I214 Non-ST elevation (NSTEMI) myocardial infarction: Secondary | ICD-10-CM

## 2023-06-17 HISTORY — PX: CORONARY STENT INTERVENTION: CATH118234

## 2023-06-17 HISTORY — PX: PERIPHERAL INTRAVASCULAR LITHOTRIPSY: CATH118324

## 2023-06-17 LAB — LIPOPROTEIN A (LPA): Lipoprotein (a): 52.5 nmol/L — ABNORMAL HIGH (ref <75.0)

## 2023-06-17 LAB — TYPE AND SCREEN: Antibody Screen: NEGATIVE

## 2023-06-17 LAB — GLUCOSE, CAPILLARY
Glucose-Capillary: 112 mg/dL — ABNORMAL HIGH (ref 70–99)
Glucose-Capillary: 104 mg/dL — ABNORMAL HIGH (ref 70–99)
Glucose-Capillary: 101 mg/dL — ABNORMAL HIGH (ref 70–99)
Glucose-Capillary: 113 mg/dL — ABNORMAL HIGH (ref 70–99)

## 2023-06-17 LAB — CBC
HCT: 27.9 % — ABNORMAL LOW (ref 36.0–46.0)
Hemoglobin: 9.1 g/dL — ABNORMAL LOW (ref 12.0–15.0)
MCH: 31 pg (ref 26.0–34.0)
MCHC: 32.6 g/dL (ref 30.0–36.0)
MCV: 94.9 fL (ref 80.0–100.0)
Platelets: 137 10*3/uL — ABNORMAL LOW (ref 150–400)
RBC: 2.94 MIL/uL — ABNORMAL LOW (ref 3.87–5.11)
RDW: 13.7 % (ref 11.5–15.5)
WBC: 9.9 10*3/uL (ref 4.0–10.5)
nRBC: 0 % (ref 0.0–0.2)

## 2023-06-17 LAB — POCT ACTIVATED CLOTTING TIME
Activated Clotting Time: 348 seconds
Activated Clotting Time: 525 seconds
Activated Clotting Time: 232 seconds

## 2023-06-17 LAB — BASIC METABOLIC PANEL
Anion gap: 16 — ABNORMAL HIGH (ref 5–15)
BUN: 26 mg/dL — ABNORMAL HIGH (ref 8–23)
CO2: 25 mmol/L (ref 22–32)
Calcium: 9 mg/dL (ref 8.9–10.3)
Chloride: 89 mmol/L — ABNORMAL LOW (ref 98–111)
Creatinine, Ser: 2.94 mg/dL — ABNORMAL HIGH (ref 0.44–1.00)
GFR, Estimated: 15 mL/min — ABNORMAL LOW (ref >60)
Glucose, Bld: 123 mg/dL — ABNORMAL HIGH (ref 70–99)
Potassium: 4 mmol/L (ref 3.5–5.1)
Sodium: 130 mmol/L — ABNORMAL LOW (ref 135–145)

## 2023-06-17 LAB — BPAM RBC: ISSUE DATE / TIME: 202406252319

## 2023-06-17 LAB — HEPARIN LEVEL (UNFRACTIONATED): Heparin Unfractionated: 0.59 IU/mL (ref 0.30–0.70)

## 2023-06-17 SURGERY — INTRAVASCULAR LITHOTRIPSY
Anesthesia: LOCAL

## 2023-06-17 MED ORDER — CLOPIDOGREL BISULFATE 300 MG PO TABS
ORAL_TABLET | ORAL | Status: AC
  Filled 2023-06-17: qty 1

## 2023-06-17 MED ORDER — SODIUM CHLORIDE 0.9 % IV SOLN: 250.0000 mL | INTRAVENOUS | Status: DC | PRN

## 2023-06-17 MED ORDER — FAMOTIDINE IN NACL 20-0.9 MG/50ML-% IV SOLN
INTRAVENOUS | Status: AC | PRN
  Administered 2023-06-17: 20 mg via INTRAVENOUS

## 2023-06-17 MED ORDER — SODIUM CHLORIDE 0.9% FLUSH: 3.0000 mL | INTRAVENOUS | Status: DC | PRN

## 2023-06-17 MED ORDER — IOHEXOL 350 MG/ML SOLN
INTRAVENOUS | Status: DC | PRN
  Administered 2023-06-17: 110 mL

## 2023-06-17 MED ORDER — NITROGLYCERIN 1 MG/10 ML FOR IR/CATH LAB
INTRA_ARTERIAL | Status: DC | PRN
  Administered 2023-06-17: 200 ug

## 2023-06-17 MED ORDER — FAMOTIDINE IN NACL 20-0.9 MG/50ML-% IV SOLN
INTRAVENOUS | Status: AC
  Filled 2023-06-17: qty 50

## 2023-06-17 MED ORDER — LORAZEPAM 2 MG/ML IJ SOLN
1.0000 mg | Freq: Once | INTRAMUSCULAR | Status: AC
  Administered 2023-06-17: 1 mg via INTRAVENOUS
  Filled 2023-06-17: qty 1

## 2023-06-17 MED ORDER — HEPARIN (PORCINE) IN NACL 1000-0.9 UT/500ML-% IV SOLN
INTRAVENOUS | Status: DC | PRN
  Administered 2023-06-17 (×2): 500 mL

## 2023-06-17 MED ORDER — HEPARIN SODIUM (PORCINE) 1000 UNIT/ML IJ SOLN
INTRAMUSCULAR | Status: AC
  Filled 2023-06-17: qty 10

## 2023-06-17 MED ORDER — SODIUM CHLORIDE 0.9 % IV SOLN: INTRAVENOUS | Status: DC

## 2023-06-17 MED ORDER — AMIODARONE HCL 200 MG PO TABS
200.0000 mg | ORAL_TABLET | Freq: Two times a day (BID) | ORAL | Status: DC
  Administered 2023-06-17 – 2023-06-25 (×17): 200 mg via ORAL
  Filled 2023-06-17 (×17): qty 1

## 2023-06-17 MED ORDER — HEPARIN SODIUM (PORCINE) 1000 UNIT/ML IJ SOLN
INTRAMUSCULAR | Status: DC | PRN
  Administered 2023-06-17: 6000 [IU] via INTRAVENOUS

## 2023-06-17 MED ORDER — CHLORHEXIDINE GLUCONATE CLOTH 2 % EX PADS: 6.0000 | MEDICATED_PAD | Freq: Every day | CUTANEOUS | Status: DC

## 2023-06-17 MED ORDER — NITROGLYCERIN 1 MG/10 ML FOR IR/CATH LAB
INTRA_ARTERIAL | Status: AC
  Filled 2023-06-17: qty 10

## 2023-06-17 MED ORDER — LIDOCAINE HCL (PF) 1 % IJ SOLN
INTRAMUSCULAR | Status: DC | PRN
  Administered 2023-06-17: 2 mL

## 2023-06-17 MED ORDER — HEPARIN SODIUM (PORCINE) 5000 UNIT/ML IJ SOLN
5000.0000 [IU] | Freq: Three times a day (TID) | INTRAMUSCULAR | Status: DC
  Administered 2023-06-17 – 2023-06-22 (×13): 5000 [IU] via SUBCUTANEOUS
  Filled 2023-06-17 (×16): qty 1

## 2023-06-17 MED ORDER — MIDAZOLAM HCL 2 MG/2ML IJ SOLN
INTRAMUSCULAR | Status: AC
  Filled 2023-06-17: qty 2

## 2023-06-17 MED ORDER — LIDOCAINE HCL (PF) 1 % IJ SOLN
INTRAMUSCULAR | Status: AC
  Filled 2023-06-17: qty 30

## 2023-06-17 MED ORDER — MIDAZOLAM HCL 2 MG/2ML IJ SOLN
INTRAMUSCULAR | Status: DC | PRN
  Administered 2023-06-17: 1 mg via INTRAVENOUS

## 2023-06-17 MED ORDER — CLOPIDOGREL BISULFATE 300 MG PO TABS
ORAL_TABLET | ORAL | Status: DC | PRN
  Administered 2023-06-17: 300 mg via ORAL

## 2023-06-17 MED ORDER — HEPARIN SODIUM (PORCINE) 1000 UNIT/ML DIALYSIS
1500.0000 [IU] | INTRAMUSCULAR | Status: DC | PRN
  Filled 2023-06-17: qty 2

## 2023-06-17 MED ORDER — SODIUM CHLORIDE 0.9% FLUSH
3.0000 mL | Freq: Two times a day (BID) | INTRAVENOUS | Status: DC
  Administered 2023-06-18 – 2023-06-22 (×7): 3 mL via INTRAVENOUS

## 2023-06-17 MED ORDER — VERAPAMIL HCL 2.5 MG/ML IV SOLN
INTRAVENOUS | Status: AC
  Filled 2023-06-17: qty 2

## 2023-06-17 MED ORDER — VERAPAMIL HCL 2.5 MG/ML IV SOLN
INTRAVENOUS | Status: DC | PRN
  Administered 2023-06-17: 10 mL via INTRA_ARTERIAL

## 2023-06-17 SURGICAL SUPPLY — 29 items
BALLN EMERGE MR 2.5X15 (BALLOONS) ×2
BALLN SCOREFLEX 2.75X15 (BALLOONS) ×2
BALLN ~~LOC~~ EMERGE MR 3.5X30 (BALLOONS) ×2
BALLOON EMERGE MR 2.5X15 (BALLOONS) IMPLANT
BALLOON SCOREFLEX 2.75X15 (BALLOONS) IMPLANT
BALLOON TAKERU 1.5X12 (BALLOONS) IMPLANT
BALLOON TAKERU 2.0X12 (BALLOONS) IMPLANT
BALLOON ~~LOC~~ EMERGE MR 3.5X30 (BALLOONS) IMPLANT
CATH GUIDELINER COAST (CATHETERS) IMPLANT
CATH LAUNCHER 6FR JR4 (CATHETERS) IMPLANT
CATH SHOCKWAVE C2 3.0X12 (CATHETERS) IMPLANT
DEVICE RAD COMP TR BAND LRG (VASCULAR PRODUCTS) IMPLANT
ELECT DEFIB PAD ADLT CADENCE (PAD) IMPLANT
GLIDESHEATH SLEND SS 6F .021 (SHEATH) IMPLANT
GUIDEWIRE INQWIRE 1.5J.035X260 (WIRE) IMPLANT
INQWIRE 1.5J .035X260CM (WIRE) ×4
KIT ENCORE 26 ADVANTAGE (KITS) IMPLANT
KIT HEART LEFT (KITS) ×3 IMPLANT
PACK CARDIAC CATHETERIZATION (CUSTOM PROCEDURE TRAY) ×3 IMPLANT
SHEATH PROBE COVER 6X72 (BAG) IMPLANT
STENT SYNERGY XD 3.0X48 (Permanent Stent) IMPLANT
STENT SYNERGY XD 3.50X16 (Permanent Stent) IMPLANT
SYNERGY XD 3.0X48 (Permanent Stent) ×2 IMPLANT
SYNERGY XD 3.50X16 (Permanent Stent) ×2 IMPLANT
TRANSDUCER W/STOPCOCK (MISCELLANEOUS) ×3 IMPLANT
TUBING CIL FLEX 10 FLL-RA (TUBING) ×3 IMPLANT
WIRE ASAHI FIELDER XT 190CM (WIRE) IMPLANT
WIRE HI TORQ VERSACORE-J 145CM (WIRE) IMPLANT
WIRE RUNTHROUGH .014X180CM (WIRE) IMPLANT

## 2023-06-17 NOTE — Progress Notes (Signed)
 Rounding Note    Patient Name: Cassandra Baker Date of Encounter: 06/17/2023  [facility] Cardiologist: Timothy Gollan, MD   Subjective   Events overnight noted.  Spoke at length with RN and patient's granddaughter who is at the bedside.  The patient had a rough night.  She underwent dialysis and packed red blood cell transfusion.  She was stable hemodynamically and also from a cardiopulmonary standpoint.  However, she had a lot of confusion and agitation.  She was treated with benzodiazepines with poor response.  She finally fell asleep 30 minutes ago and is now resting comfortably.  Inpatient Medications    Scheduled Meds:  sodium chloride   Intravenous Once   aspirin EC  81 mg Oral Daily   atorvastatin  80 mg Oral Daily   calcium acetate  667 mg Oral TID WC   cephALEXin  500 mg Oral BID   Chlorhexidine Gluconate Cloth  6 each Topical Q0600   clopidogrel  75 mg Oral Daily   FLUoxetine  10 mg Oral Once per day on Mon Wed Fri   furosemide  80 mg Oral BID   insulin aspart  0-15 Units Subcutaneous TID WC   levothyroxine  75 mcg Oral QAC breakfast   metoprolol succinate  25 mg Oral Daily   sodium bicarbonate  650 mg Oral BID   sodium chloride flush  3 mL Intravenous Q12H   sodium chloride flush  3 mL Intravenous Q12H   Continuous Infusions:  sodium chloride     sodium chloride     sodium chloride     amiodarone 30 mg/hr (06/16/23 2041)   heparin 1,100 Units/hr (06/16/23 2004)   PRN Meds: sodium chloride, sodium chloride, acetaminophen, nitroGLYCERIN, ondansetron (ZOFRAN) IV, sodium chloride flush, sodium chloride flush   Vital Signs    Vitals:   06/17/23 0600 06/17/23 0615 06/17/23 0630 06/17/23 0645  BP: (!) 142/69     Pulse: 78 74 69 71  Resp: (!) 24 (!) 23 18 19  Temp:      TempSrc:      SpO2: 98% 98% 100% 100%  Weight:        Intake/Output Summary (Last 24 hours) at 06/17/2023 0653 Last data filed at 06/17/2023 0015 Gross per 24 hour  Intake 467.7  ml  Output --  Net 467.7 ml      06/17/2023    1:05 AM 06/16/2023    4:22 AM 06/15/2023    6:10 PM  Last 3 Weights  Weight (lbs) 133 lb 2.5 oz 157 lb 10.1 oz 147 lb 0.8 oz  Weight (kg) 60.4 kg 71.5 kg 66.7 kg      Telemetry    Normal sinus rhythm with PVCs and a 4 beat ventricular run- Personally Reviewed   Physical Exam  Elderly woman, in NAD, resting comfortably GEN: No acute distress.   Neck: No JVD Cardiac: RRR, no murmurs, rubs, or gallops.  Respiratory: Clear to auscultation bilaterally. GI: Soft, nontender, non-distended  MS: No edema; No deformity.   Labs    High Sensitivity Troponin:   Recent Labs  Lab 06/03/23 2353 06/04/23 0145 06/05/23 1421 06/15/23 2237 06/16/23 0013  TROPONINIHS 986* 998* 993* 180* 209*     Chemistry Recent Labs  Lab 06/11/23 1322 06/12/23 0413 06/13/23 0433 06/15/23 0413 06/16/23 0420 06/16/23 0421 06/16/23 1700 06/17/23 0536  NA 131*   < > 133*   < >  --  126* 122* 130*  K 5.1   < >   4.8   < >  --  4.4 4.6 4.0  CL 95*   < > 96*   < >  --  91* 86* 89*  CO2 21*   < > 24   < >  --  25 23 25  GLUCOSE 146*   < > 132*   < >  --  120* 146* 123*  BUN 97*   < > 70*   < >  --  59* 58* 26*  CREATININE 6.45*   < > 4.68*   < >  --  4.38* 4.71* 2.94*  CALCIUM 9.7   < > 9.4   < >  --  9.0 9.1 9.0  MG  --   --  2.5*  --  2.2  --   --   --   PROT 6.6  --   --   --   --   --   --   --   ALBUMIN 3.7  --  3.4*  --   --   --   --   --   AST 15  --   --   --   --   --   --   --   ALT 28  --   --   --   --   --   --   --   ALKPHOS 106  --   --   --   --   --   --   --   BILITOT 0.6  --   --   --   --   --   --   --   GFRNONAA 6*   < > 9*   < >  --  10* 9* 15*  ANIONGAP 15   < > 13   < >  --  10 13 16*   < > = values in this interval not displayed.    Lipids No results for input(s): "CHOL", "TRIG", "HDL", "LABVLDL", "LDLCALC", "CHOLHDL" in the last 168 hours.  Hematology Recent Labs  Lab 06/16/23 0421 06/16/23 1700 06/17/23 0536  WBC  8.4 10.6* 9.9  RBC 2.59* 2.69* 2.94*  HGB 7.8* 8.1* 9.1*  HCT 25.0* 25.5* 27.9*  MCV 96.5 94.8 94.9  MCH 30.1 30.1 31.0  MCHC 31.2 31.8 32.6  RDW 13.2 13.5 13.7  PLT 142* 183 137*   Thyroid No results for input(s): "TSH", "FREET4" in the last 168 hours.  BNP Recent Labs  Lab 06/11/23 1322  BNP 2,338.2*    DDimer No results for input(s): "DDIMER" in the last 168 hours.   Radiology    CARDIAC CATHETERIZATION  Result Date: 06/15/2023   Mid LAD lesion is 80% stenosed.   Mid Cx to Dist Cx lesion is 90% stenosed.   Ramus lesion is 60% stenosed.   Prox RCA to Mid RCA lesion is 95% stenosed.   Mid RCA to Dist RCA lesion is 60% stenosed.   RPDA lesion is 90% stenosed.   RPAV lesion is 80% stenosed.   There is moderate to severe left ventricular systolic dysfunction.   LV end diastolic pressure is mildly elevated.   The left ventricular ejection fraction is 25-35% by visual estimate. 1.  Severe three-vessel coronary artery disease.  The culprit for myocardial infarction is likely subtotal occlusion of the right coronary artery which is heavily calcified and diffusely diseased in the proximal/mid segment.  In addition, there is complex bifurcation disease at the right PDA and right posterior   AV groove artery. 2.  Mildly with severely reduced LV systolic function with an EF of 30% with severe inferior and apical hypokinesis. 3.  Right heart catheterization showed mildly elevated filling pressures, mild pulmonary hypertension and normal cardiac output.  No significant V waves noted on wedge tracing. Recommendations: Difficult revascularization options.  The patient has surgical disease but she might not be a candidate for CABG.  The RCA is the culprit but it is diffusely diseased and heavily calcified.  Will discuss with patient and family and consider transfer to [facility] for evaluation.  If we decide to PCI, it is preferable to transfuse her to get her hemoglobin above 9 and load with an  antiplatelet medication.    Cardiac Studies   Reviewed as above  Patient Profile     82 y.o. female with history of CAD with recent NSTEMI medically managed as outlined below, chronic combined systolic and diastolic CHF, ESRD, anemia of chronic disease, DM2, HTN, hypothyroidism, depression, asthma, and OA who was recently admitted to the hospital earlier this month with acute combined CHF and NSTEMI and left AMA who was readmitted with volume overload and who is being seen for polymorphic ventricular tachycardia and NSTEMI.   Assessment & Plan    1.  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  Treatment options limited due to hypotension.  Patient managed currently with amiodarone.  Telemetry reviewed and shows no sustained arrhythmia.  Transition to oral amiodarone today. 2.  Non-STEMI: Patient noted to have severe three-vessel coronary artery disease.  She has transferred here for multivessel PCI.  The patient is not a candidate for CABG due to advanced age and marked comorbidities.  Now loaded with clopidogrel.  Plans for PCI today.  Will likely require atherectomy or shockwave lithotripsy due to heavy coronary calcification.  I think her mental status needs to be reassessed later in the day when she wakes up.  Will ask Dr. Arida to evaluate her as he is scheduled to do her PCI.  If her mental status has not significantly improved, I do not think we should move forward with PCI today as her case involves complex coronary anatomy. 3.  Heart failure with reduced ejection fraction: Underlying ischemic cardiomyopathy noted.  Not a candidate for ACE/ARB/aldosterone antagonism because of end-stage kidney disease and hypotension.  Treatment options very limited at present.  Volume removal with dialysis.  Low-dose beta-blocker as tolerated. 4.  End-stage renal disease: Appreciate nephrology care. 5.  Anemia, acute on chronic: Received 1 unit of packed red blood cells overnight.  Hemoglobin this morning  9.1.  No evidence of active bleeding. 6. Delirium - multifactorial. Now resting comfortably. Will reassess when she wakes up.  For questions or updates, please contact [facility] Please consult [email or web address] for contact info under        Signed, Quantina Dershem, MD  06/17/2023, 6:53 AM    

## 2023-06-17 NOTE — Progress Notes (Addendum)
Received patient in bed to unit.  Alert and oriented.  Informed consent signed and in chart. yes  TX duration:2:79mins  Patient tolerated well.Pt agitated & restless.  Transported back to the room Done at bedside. Alert, without acute distress.  Hand-off given to patient's nurse. Jovita Gamma RN  Access used: Rt femoral Access issues: none  Total UF removed: Medication(s) given: 0 Post HD VS: temp 97.7, B/P138/87, pulse 103, resp 16 Post HD weight: 60.4kg   [facility]

## 2023-06-17 NOTE — Progress Notes (Signed)
Advised of pt's transfer from [facility] to [facility] by renal navigator at [facility]. Pt has been accepted at [facility] TTS 11:30 chair time. Pt will need to arrive at 11:00 am for first appt to complete paperwork prior to treatment. Will discuss this information with pt/family tomorrow. Pt currently unavailable. Will assist as needed.   Olivia Canter Renal Navigator [phone]

## 2023-06-17 NOTE — H&P (View-Only) (Signed)
Rounding Note    Patient Name: Cassandra Baker Date of Encounter: 06/17/2023  [facility] Cardiologist: Julien Nordmann, MD   Subjective   Events overnight noted.  Spoke at length with RN and patient's granddaughter who is at the bedside.  The patient had a rough night.  She underwent dialysis and packed red blood cell transfusion.  She was stable hemodynamically and also from a cardiopulmonary standpoint.  However, she had a lot of confusion and agitation.  She was treated with benzodiazepines with poor response.  She finally fell asleep 30 minutes ago and is now resting comfortably.  Inpatient Medications    Scheduled Meds:  sodium chloride   Intravenous Once   aspirin EC  81 mg Oral Daily   atorvastatin  80 mg Oral Daily   calcium acetate  667 mg Oral TID WC   cephALEXin  500 mg Oral BID   Chlorhexidine Gluconate Cloth  6 each Topical Q0600   clopidogrel  75 mg Oral Daily   FLUoxetine  10 mg Oral Once per day on Mon Wed Fri   furosemide  80 mg Oral BID   insulin aspart  0-15 Units Subcutaneous TID WC   levothyroxine  75 mcg Oral QAC breakfast   metoprolol succinate  25 mg Oral Daily   sodium bicarbonate  650 mg Oral BID   sodium chloride flush  3 mL Intravenous Q12H   sodium chloride flush  3 mL Intravenous Q12H   Continuous Infusions:  sodium chloride     sodium chloride     sodium chloride     amiodarone 30 mg/hr (06/16/23 2041)   heparin 1,100 Units/hr (06/16/23 2004)   PRN Meds: sodium chloride, sodium chloride, acetaminophen, nitroGLYCERIN, ondansetron (ZOFRAN) IV, sodium chloride flush, sodium chloride flush   Vital Signs    Vitals:   06/17/23 0600 06/17/23 0615 06/17/23 0630 06/17/23 0645  BP: (!) 142/69     Pulse: 78 74 69 71  Resp: (!) 24 (!) 23 18 19   Temp:      TempSrc:      SpO2: 98% 98% 100% 100%  Weight:        Intake/Output Summary (Last 24 hours) at 06/17/2023 0653 Last data filed at 06/17/2023 0015 Gross per 24 hour  Intake 467.7  ml  Output --  Net 467.7 ml      06/17/2023    1:05 AM 06/16/2023    4:22 AM 06/15/2023    6:10 PM  Last 3 Weights  Weight (lbs) 133 lb 2.5 oz 157 lb 10.1 oz 147 lb 0.8 oz  Weight (kg) 60.4 kg 71.5 kg 66.7 kg      Telemetry    Normal sinus rhythm with PVCs and a 4 beat ventricular run- Personally Reviewed   Physical Exam  Elderly woman, in NAD, resting comfortably GEN: No acute distress.   Neck: No JVD Cardiac: RRR, no murmurs, rubs, or gallops.  Respiratory: Clear to auscultation bilaterally. GI: Soft, nontender, non-distended  MS: No edema; No deformity.   Labs    High Sensitivity Troponin:   Recent Labs  Lab 06/03/23 2353 06/04/23 0145 06/05/23 1421 06/15/23 2237 06/16/23 0013  TROPONINIHS 986* 998* 993* 180* 209*     Chemistry Recent Labs  Lab 06/11/23 1322 06/12/23 0413 06/13/23 0433 06/15/23 0413 06/16/23 0420 06/16/23 0421 06/16/23 1700 06/17/23 0536  NA 131*   < > 133*   < >  --  126* 122* 130*  K 5.1   < >  4.8   < >  --  4.4 4.6 4.0  CL 95*   < > 96*   < >  --  91* 86* 89*  CO2 21*   < > 24   < >  --  25 23 25   GLUCOSE 146*   < > 132*   < >  --  120* 146* 123*  BUN 97*   < > 70*   < >  --  59* 58* 26*  CREATININE 6.45*   < > 4.68*   < >  --  4.38* 4.71* 2.94*  CALCIUM 9.7   < > 9.4   < >  --  9.0 9.1 9.0  MG  --   --  2.5*  --  2.2  --   --   --   PROT 6.6  --   --   --   --   --   --   --   ALBUMIN 3.7  --  3.4*  --   --   --   --   --   AST 15  --   --   --   --   --   --   --   ALT 28  --   --   --   --   --   --   --   ALKPHOS 106  --   --   --   --   --   --   --   BILITOT 0.6  --   --   --   --   --   --   --   GFRNONAA 6*   < > 9*   < >  --  10* 9* 15*  ANIONGAP 15   < > 13   < >  --  10 13 16*   < > = values in this interval not displayed.    Lipids No results for input(s): "CHOL", "TRIG", "HDL", "LABVLDL", "LDLCALC", "CHOLHDL" in the last 168 hours.  Hematology Recent Labs  Lab 06/16/23 0421 06/16/23 1700 06/17/23 0536  WBC  8.4 10.6* 9.9  RBC 2.59* 2.69* 2.94*  HGB 7.8* 8.1* 9.1*  HCT 25.0* 25.5* 27.9*  MCV 96.5 94.8 94.9  MCH 30.1 30.1 31.0  MCHC 31.2 31.8 32.6  RDW 13.2 13.5 13.7  PLT 142* 183 137*   Thyroid No results for input(s): "TSH", "FREET4" in the last 168 hours.  BNP Recent Labs  Lab 06/11/23 1322  BNP 2,338.2*    DDimer No results for input(s): "DDIMER" in the last 168 hours.   Radiology    CARDIAC CATHETERIZATION  Result Date: 06/15/2023   Mid LAD lesion is 80% stenosed.   Mid Cx to Dist Cx lesion is 90% stenosed.   Ramus lesion is 60% stenosed.   Prox RCA to Mid RCA lesion is 95% stenosed.   Mid RCA to Dist RCA lesion is 60% stenosed.   RPDA lesion is 90% stenosed.   RPAV lesion is 80% stenosed.   There is moderate to severe left ventricular systolic dysfunction.   LV end diastolic pressure is mildly elevated.   The left ventricular ejection fraction is 25-35% by visual estimate. 1.  Severe three-vessel coronary artery disease.  The culprit for myocardial infarction is likely subtotal occlusion of the right coronary artery which is heavily calcified and diffusely diseased in the proximal/mid segment.  In addition, there is complex bifurcation disease at the right PDA and right posterior  AV groove artery. 2.  Mildly with severely reduced LV systolic function with an EF of 30% with severe inferior and apical hypokinesis. 3.  Right heart catheterization showed mildly elevated filling pressures, mild pulmonary hypertension and normal cardiac output.  No significant V waves noted on wedge tracing. Recommendations: Difficult revascularization options.  The patient has surgical disease but she might not be a candidate for CABG.  The RCA is the culprit but it is diffusely diseased and heavily calcified.  Will discuss with patient and family and consider transfer to [facility] for evaluation.  If we decide to PCI, it is preferable to transfuse her to get her hemoglobin above 9 and load with an  antiplatelet medication.    Cardiac Studies   Reviewed as above  Patient Profile     82 y.o. female with history of CAD with recent NSTEMI medically managed as outlined below, chronic combined systolic and diastolic CHF, ESRD, anemia of chronic disease, DM2, HTN, hypothyroidism, depression, asthma, and OA who was recently admitted to the hospital earlier this month with acute combined CHF and NSTEMI and left AMA who was readmitted with volume overload and who is being seen for polymorphic ventricular tachycardia and NSTEMI.   Assessment & Plan    1.  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  Treatment options limited due to hypotension.  Patient managed currently with amiodarone.  Telemetry reviewed and shows no sustained arrhythmia.  Transition to oral amiodarone today. 2.  Non-STEMI: Patient noted to have severe three-vessel coronary artery disease.  She has transferred here for multivessel PCI.  The patient is not a candidate for CABG due to advanced age and marked comorbidities.  Now loaded with clopidogrel.  Plans for PCI today.  Will likely require atherectomy or shockwave lithotripsy due to heavy coronary calcification.  I think her mental status needs to be reassessed later in the day when she wakes up.  Will ask Dr. Kirke Corin to evaluate her as he is scheduled to do her PCI.  If her mental status has not significantly improved, I do not think we should move forward with PCI today as her case involves complex coronary anatomy. 3.  Heart failure with reduced ejection fraction: Underlying ischemic cardiomyopathy noted.  Not a candidate for ACE/ARB/aldosterone antagonism because of end-stage kidney disease and hypotension.  Treatment options very limited at present.  Volume removal with dialysis.  Low-dose beta-blocker as tolerated. 4.  End-stage renal disease: Appreciate nephrology care. 5.  Anemia, acute on chronic: Received 1 unit of packed red blood cells overnight.  Hemoglobin this morning  9.1.  No evidence of active bleeding. 6. Delirium - multifactorial. Now resting comfortably. Will reassess when she wakes up.  For questions or updates, please contact [facility] Please consult [email or web address] for contact info under        Signed, Tonny Bollman, MD  06/17/2023, 6:53 AM

## 2023-06-17 NOTE — Progress Notes (Addendum)
   06/17/23 0230  Vitals  Temp 97.7 F (36.5 C)  Temp Source Axillary  BP Location Right Arm  BP Method Automatic  Patient Position (if appropriate) Lying  Pulse Rate Source Monitor  Oxygen Therapy  O2 Device Nasal Cannula  O2 Flow Rate (L/min) 2 L/min  During Treatment Monitoring  Intra-Hemodialysis Comments Tx completed  Hemodialysis Catheter Right Femoral vein Triple lumen Temporary (Non-Tunneled)  Placement Date/Time: 06/12/23 1244   Time Out: Correct patient;Correct site;Correct procedure  Maximum sterile barrier precautions: Hand hygiene;Cap;Mask;Sterile gown;Sterile gloves;Large sterile sheet  Site Prep: Chlorhexidine (preferred)  Local Anes...  Site Condition No complications  Blue Lumen Status Flushed;Saline locked  Red Lumen Status Flushed;Saline locked  Catheter fill volume (Arterial) 2 cc  Catheter fill volume (Venous) 2  Dressing Type Transparent  Dressing Status Clean, Dry, Intact  Dressing Change Due 06/19/23  Post treatment catheter status Capped and Clamped    1Unit PRBCs transfused. A positive # [id number]. Expiration July 10,2024. Pt restless,but cognitive during treatment. Report to [facility] Working Charity fundraiser.

## 2023-06-17 NOTE — Interval H&P Note (Signed)
History and Physical Interval Note: I had a prolonged discussion with the patient, granddaughter Jana Half) and daughter-in-law Claris Che) about risk and benefit of RCA PCI.  The patient received Ativan last night and did not sleep well and thus she was intermittently confused but did participate in the conversation.  I discussed increased risk of the procedure due to diffuse disease and degree of calcifications.  I also discussed the option of medical management and focusing on comfort.  We decided to attempt right radial artery access as she does not have any fistula in that arm.  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  After prolonged conversation and discussion, they all agreed to proceed with PCI.  06/17/2023 2:05 PM  Cassandra Baker  has presented today for surgery, with the diagnosis of cad.  The various methods of treatment have been discussed with the patient and family. After consideration of risks, benefits and other options for treatment, the patient has consented to  Procedure(s): CORONARY ATHERECTOMY (N/A) as a surgical intervention.  The patient's history has been reviewed, patient examined, no change in status, stable for surgery.  I have reviewed the patient's chart and labs.  Questions were answered to the patient's satisfaction.     Lorine Bears

## 2023-06-17 NOTE — Progress Notes (Signed)
Blood started at 2335 and ended at 0014. Rate increased per dialysis RN and ran through dialysis machine.

## 2023-06-17 NOTE — Progress Notes (Signed)
ANTICOAGULATION CONSULT NOTE  Pharmacy Consult for heparin Indication: chest pain/ACS  Allergies  Allergen Reactions   Plum Pulp Anaphylaxis and Cough   Citrus     Coughing, chest tightness    Other Itching    Dairy products/Soaps & detergents   Penicillins Rash    Tolerates ceftriaxone    Patient Measurements: Weight: 60.4 kg (133 lb 2.5 oz) Heparin Dosing Weight: 66.1 kg  Vital Signs: Temp: 98.2 F (36.8 C) (06/26 0740) Temp Source: Oral (06/26 0740) BP: 118/47 (06/26 0800) Pulse Rate: 69 (06/26 0800)  Labs: Recent Labs    06/15/23 0413 06/15/23 1001 06/15/23 2237 06/16/23 0013 06/16/23 0421 06/16/23 1700 06/17/23 0536  HGB 8.0*   < >  --   --  7.8* 8.1* 9.1*  HCT 25.6*   < >  --   --  25.0* 25.5* 27.9*  PLT 147*  --   --   --  142* 183 137*  HEPARINUNFRC 0.46  --   --   --  0.50  --  0.59  CREATININE 4.91*  --   --   --  4.38* 4.71* 2.94*  TROPONINIHS  --   --  180* 209*  --   --   --    < > = values in this interval not displayed.     Estimated Creatinine Clearance: 12.7 mL/min (A) (by C-G formula based on SCr of 2.94 mg/dL (H)).   Medical History: Past Medical History:  Diagnosis Date   Anemia    Arthritis    low back,knees   Asthma    Basal cell carcinoma (BCC)    a. nose   CHF (congestive heart failure) (HCC)    a. prev eval by Dr. Katrinka Blazing in [place] - "many yrs ago."   CKD (chronic kidney disease), stage IV (HCC)    Depression    Diabetes mellitus without complication (HCC)    Dyspnea    Hypertension    Hypothyroidism     Medications:  Scheduled:   sodium chloride   Intravenous Once   aspirin EC  81 mg Oral Daily   atorvastatin  80 mg Oral Daily   calcium acetate  667 mg Oral TID WC   cephALEXin  500 mg Oral BID   Chlorhexidine Gluconate Cloth  6 each Topical Q0600   clopidogrel  75 mg Oral Daily   FLUoxetine  10 mg Oral Once per day on Mon Wed Fri   furosemide  80 mg Oral BID   insulin aspart  0-15 Units Subcutaneous TID WC    levothyroxine  75 mcg Oral QAC breakfast   metoprolol succinate  25 mg Oral Daily   sodium bicarbonate  650 mg Oral BID   sodium chloride flush  3 mL Intravenous Q12H   sodium chloride flush  3 mL Intravenous Q12H    Assessment: 82 yo female recently admitted to the hospital earlier this month with acute combined CHF and NSTEMI and left AMA who was readmitted with volume overload now s/p cath and plans for PCI on 6/27 -heparin level at goal on 1100 units/hr  Goal of Therapy:  Heparin level 0.3-0.7 units/ml Monitor platelets by anticoagulation protocol: Yes   Plan:  -Continue heparin at 1100 units/hr -Daily heparin level and CBC  Harland German, PharmD Clinical Pharmacist **Pharmacist phone directory can now be found on [email or web address] ([email or web address]).  Listed under [facility].

## 2023-06-17 NOTE — Progress Notes (Signed)
[facility] Progress Note  Subjective: seen in room, pt was sedated w/ IV ativan earlier this am, had a "rough night" w/ agitation during dialysis. Sleeping soundly now.   Vitals:   06/17/23 1000 06/17/23 1100 06/17/23 1130 06/17/23 1200  BP: 134/63 128/68  123/64  Pulse: 72 69 63 63  Resp: (!) 22 16 (!) 23 17  Temp:   97.6 F (36.4 C)   TempSrc:   Oral   SpO2: 100% 100% 100% 100%  Weight:        Exam: Gen sleeping, no distress No jvd or bruits Chest clear bilat to bases RRR no MRG Abd soft ntnd no mass or ascites +bs GU defer MS no joint effusions or deformity Ext no LE edema Neuro is alert, Ox 3 , nf    R fem temp HD cath      Home meds include - asa, lipitor, toprol xl 25 every day, zyloprim, phoslo 1 ac tid, prozac, lasix 80 bid, synthroid, sitagliptin, sod bicarb, prns/ vits/ supps       Assessment/ Plan: 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 Had 1st HD 6/21 and 2nd HD 6/24 (shortened to 1 hr by pt). She c/o what is most likely myoclonic jerking related to uremia, but has not shown any signs of asterixis here. She is having problems with agitation and restlessness while getting dialysis, this occurred w/ HD again last night. She required IV ativan given early am today. We have not been doing aggressive dialysis times/ BFR's due to her new start status. However, this could still be a form of dialysis dysequilibrium in a patient who appears to have been battling w/ uremic symptoms for a long time prior to starting on dialysis. Will hold off on HD today. Next HD tomorrow --> will work around any plans for heart cath.  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 Per cardiology.  HTN/ volume - BP's soft, no vol excess on exam, no edema, clear lungs and clear last CXR.  Hyponatremia - changing to renal diet w/ fluid restriction 1200 cc/d.  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 Tsat done 24%, ferritin 152.  MBD ckd - CCa in  range. Getting phoslo at 1 ac tid. F/u phos improving w/ hd. Get pth.  A/C systolic CHF - no vol overload. Echo showed LVEF 30-35%.  DM2 Hypothyroid Anxiety/ depression EColi UTI       Vinson Moselle MD  [facility] 06/17/2023, 1:18 PM  Recent Labs  Lab 06/11/23 1322 06/12/23 0413 06/13/23 0433 06/14/23 0621 06/16/23 1700 06/17/23 0536  HGB 8.3*   < > 8.7*   < > 8.1* 9.1*  ALBUMIN 3.7  --  3.4*  --   --   --   CALCIUM 9.7   < > 9.4   < > 9.1 9.0  PHOS  --   --  8.0*  --  6.2*  --   CREATININE 6.45*   < > 4.68*   < > 4.71* 2.94*  K 5.1   < > 4.8   < > 4.6 4.0   < > = values in this interval not displayed.   Recent Labs  Lab 06/15/23 0413  IRON 69  TIBC 287  FERRITIN 152   Inpatient medications:  sodium chloride   Intravenous Once   aspirin EC  81 mg Oral Daily   atorvastatin  80 mg Oral  Daily   calcium acetate  667 mg Oral TID WC   cephALEXin  500 mg Oral BID   Chlorhexidine Gluconate Cloth  6 each Topical Q0600   clopidogrel  75 mg Oral Daily   FLUoxetine  10 mg Oral Once per day on Mon Wed Fri   furosemide  80 mg Oral BID   insulin aspart  0-15 Units Subcutaneous TID WC   levothyroxine  75 mcg Oral QAC breakfast   metoprolol succinate  25 mg Oral Daily   sodium bicarbonate  650 mg Oral BID   sodium chloride flush  3 mL Intravenous Q12H   sodium chloride flush  3 mL Intravenous Q12H    sodium chloride     sodium chloride     sodium chloride     amiodarone 30 mg/hr (06/17/23 1200)   heparin 1,100 Units/hr (06/17/23 1200)   sodium chloride, sodium chloride, acetaminophen, nitroGLYCERIN, ondansetron (ZOFRAN) IV, sodium chloride flush, sodium chloride flush

## 2023-06-18 ENCOUNTER — Inpatient Hospital Stay (HOSPITAL_COMMUNITY): Payer: Medicare Other

## 2023-06-18 ENCOUNTER — Encounter (HOSPITAL_COMMUNITY): Payer: Self-pay | Admitting: Cardiovascular Disease

## 2023-06-18 DIAGNOSIS — I472 Ventricular tachycardia, unspecified: Secondary | ICD-10-CM

## 2023-06-18 LAB — BASIC METABOLIC PANEL WITH GFR
Anion gap: 17 — ABNORMAL HIGH (ref 5–15)
BUN: 34 mg/dL — ABNORMAL HIGH (ref 8–23)
CO2: 23 mmol/L (ref 22–32)
Calcium: 9 mg/dL (ref 8.9–10.3)
Chloride: 88 mmol/L — ABNORMAL LOW (ref 98–111)
Creatinine, Ser: 3.69 mg/dL — ABNORMAL HIGH (ref 0.44–1.00)
GFR, Estimated: 12 mL/min — ABNORMAL LOW (ref >60)
Glucose, Bld: 91 mg/dL (ref 70–99)
Potassium: 3.9 mmol/L (ref 3.5–5.1)
Sodium: 128 mmol/L — ABNORMAL LOW (ref 135–145)

## 2023-06-18 LAB — CBC
HCT: 23.2 % — ABNORMAL LOW (ref 36.0–46.0)
Hemoglobin: 7.6 g/dL — ABNORMAL LOW (ref 12.0–15.0)
MCH: 31.5 pg (ref 26.0–34.0)
MCHC: 32.8 g/dL (ref 30.0–36.0)
MCV: 96.3 fL (ref 80.0–100.0)
Platelets: 113 K/uL — ABNORMAL LOW (ref 150–400)
RBC: 2.41 MIL/uL — ABNORMAL LOW (ref 3.87–5.11)
RDW: 14.1 % (ref 11.5–15.5)
WBC: 7.2 K/uL (ref 4.0–10.5)
nRBC: 0 % (ref 0.0–0.2)

## 2023-06-18 LAB — PREPARE RBC (CROSSMATCH)

## 2023-06-18 LAB — GLUCOSE, CAPILLARY
Glucose-Capillary: 91 mg/dL (ref 70–99)
Glucose-Capillary: 78 mg/dL (ref 70–99)
Glucose-Capillary: 178 mg/dL — ABNORMAL HIGH (ref 70–99)
Glucose-Capillary: 129 mg/dL — ABNORMAL HIGH (ref 70–99)

## 2023-06-18 LAB — RENAL FUNCTION PANEL
Albumin: 2.5 g/dL — ABNORMAL LOW (ref 3.5–5.0)
Anion gap: 11 (ref 5–15)
BUN: 32 mg/dL — ABNORMAL HIGH (ref 8–23)
CO2: 24 mmol/L (ref 22–32)
Calcium: 8.7 mg/dL — ABNORMAL LOW (ref 8.9–10.3)
Chloride: 89 mmol/L — ABNORMAL LOW (ref 98–111)
Creatinine, Ser: 3.67 mg/dL — ABNORMAL HIGH (ref 0.44–1.00)
GFR, Estimated: 12 mL/min — ABNORMAL LOW (ref >60)
Glucose, Bld: 88 mg/dL (ref 70–99)
Phosphorus: 5.8 mg/dL — ABNORMAL HIGH (ref 2.5–4.6)
Potassium: 3.8 mmol/L (ref 3.5–5.1)
Sodium: 124 mmol/L — ABNORMAL LOW (ref 135–145)

## 2023-06-18 LAB — TYPE AND SCREEN: Unit division: 0

## 2023-06-18 LAB — PARATHYROID HORMONE, INTACT (NO CA): PTH: 858 pg/mL — ABNORMAL HIGH (ref 15–65)

## 2023-06-18 LAB — BPAM RBC
Blood Product Expiration Date: 202407162359
ISSUE DATE / TIME: 202406270929

## 2023-06-18 MED ORDER — ALBUMIN HUMAN 25 % IV SOLN
25.0000 g | INTRAVENOUS | Status: DC | PRN
  Administered 2023-06-23: 25 g via INTRAVENOUS

## 2023-06-18 MED ORDER — DARBEPOETIN ALFA 60 MCG/0.3ML IJ SOSY: 60.0000 ug | PREFILLED_SYRINGE | Freq: Once | INTRAMUSCULAR | Status: DC

## 2023-06-18 MED ORDER — METHOCARBAMOL 500 MG PO TABS
500.0000 mg | ORAL_TABLET | Freq: Three times a day (TID) | ORAL | Status: DC | PRN
  Administered 2023-06-18 – 2023-06-27 (×11): 500 mg via ORAL
  Filled 2023-06-18 (×11): qty 1

## 2023-06-18 MED ORDER — SODIUM CHLORIDE 0.9% IV SOLUTION: Freq: Once | INTRAVENOUS | Status: DC

## 2023-06-18 MED ORDER — ANTICOAGULANT SODIUM CITRATE 4% (200MG/5ML) IV SOLN: 5.0000 mL | Status: DC | PRN

## 2023-06-18 MED ORDER — DARBEPOETIN ALFA 60 MCG/0.3ML IJ SOSY
60.0000 ug | PREFILLED_SYRINGE | INTRAMUSCULAR | Status: DC
  Administered 2023-06-18 – 2023-06-25 (×2): 60 ug via SUBCUTANEOUS
  Filled 2023-06-18 (×2): qty 0.3

## 2023-06-18 MED ORDER — HEPARIN SODIUM (PORCINE) 1000 UNIT/ML DIALYSIS
1000.0000 [IU] | INTRAMUSCULAR | Status: DC | PRN
  Administered 2023-06-18: 1000 [IU]
  Administered 2023-06-18: 3600 [IU]
  Filled 2023-06-18 (×2): qty 1

## 2023-06-18 MED ORDER — ALTEPLASE 2 MG IJ SOLR: 2.0000 mg | Freq: Once | INTRAMUSCULAR | Status: DC | PRN

## 2023-06-18 MED ORDER — HEPARIN SODIUM (PORCINE) 1000 UNIT/ML DIALYSIS
1500.0000 [IU] | INTRAMUSCULAR | Status: AC | PRN
  Administered 2023-06-18 (×2): 1500 [IU] via INTRAVENOUS_CENTRAL

## 2023-06-18 NOTE — Progress Notes (Signed)
POST HD TX NOTE  06/18/23 1127  Vitals  Temp 98.5 F (36.9 C)  Temp Source Oral  BP (!) 138/57  MAP (mmHg) 78  BP Location Left Leg (ankle)  BP Method Automatic  Patient Position (if appropriate) Lying  Pulse Rate (!) 46  Pulse Rate Source Monitor  ECG Heart Rate 72  Resp 18  Oxygen Therapy  SpO2 98 %  O2 Device Nasal Cannula  O2 Flow Rate (L/min) 2 L/min  Pulse Oximetry Type Continuous  During Treatment Monitoring  Intra-Hemodialysis Comments (S)   (post HD tx VS check)  Post Treatment  Dialyzer Clearance Lightly streaked  Duration of HD Treatment -hour(s) 3.25 hour(s)  Hemodialysis Intake (mL) 365 mL (PRBC + NS PRBC line flush 50ml = )  Liters Processed 48.7  Fluid Removed (mL) 2035 mL ( (value from machine) - ( PRBC + NS PRBC line flush) = )  Tolerated HD Treatment (S)  Yes (bp actually correted high)  Post-Hemodialysis Comments (S)  tx completed w/out problem other than bp all over the place. pt was sbp 130-140's prior to me starting tx and directly before i was to connect lines bp dropped to 80's, i rechecked and it was in the 90's. after orders changed to pull fluid and time increased, pt's bp started over correcting. it would be normal thne spike high diastolically w/ high MAP. primary nurse even went ahead and gave her scheduled metoprolol w/ an hour and 19 min left on tx and it had no effect. UF goal met, blood rinsed back, VSS. Medication Admin: Heparin 1500 units bolus x2, Heparin Bolus 3600 units. 1 unit PRBC  Hemodialysis Catheter Right Femoral vein Triple lumen Temporary (Non-Tunneled)  Placement Date/Time: 06/12/23 1244   Time Out: Correct patient;Correct site;Correct procedure  Maximum sterile barrier precautions: Hand hygiene;Cap;Mask;Sterile gown;Sterile gloves;Large sterile sheet  Site Prep: Chlorhexidine (preferred)  Local Anes...  Site Condition No complications  Blue Lumen Status Heparin locked;Dead end cap in place  Red Lumen  Status Heparin locked;Dead end cap in place  Purple Lumen Status Saline locked  Catheter fill solution Heparin 1000 units/ml  Catheter fill volume (Arterial) 1.8 cc  Catheter fill volume (Venous) 1.8  Dressing Type Transparent  Dressing Status Antimicrobial disc in place;Clean, Dry, Intact  Drainage Description None  Dressing Change Due 06/22/23  Post treatment catheter status Capped and Clamped

## 2023-06-18 NOTE — Progress Notes (Addendum)
[facility] Progress Note  Subjective: seen in room. Pt is very anxious in general.   Vitals:   06/18/23 1200 06/18/23 1300 06/18/23 1400 06/18/23 1500  BP: (!) 149/98 137/75 (!) 132/58 (!) 141/115  Pulse: (!) 48 74 75 76  Resp: 19 20 19 16   Temp:      TempSrc:      SpO2: 97% 95% 97% 93%  Weight:        Exam: Gen alert, no distress, no myoclonic jerking No jvd or bruits Chest clear bilat to bases RRR no MRG Abd soft ntnd no mass or ascites +bs GU defer MS no joint effusions or deformity Ext no LE edema Neuro is alert, Ox 3 , nf    R fem temp HD cath      Home meds include - asa, lipitor, toprol xl 25 every day, zyloprim, phoslo 1 ac tid, prozac, lasix 80 bid, synthroid, sitagliptin, sod bicarb, prns/ vits/ supps       Assessment/ Plan: 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 Had 1st HD 6/21 and 2nd HD 6/24 (shortened to 1 hr by pt). Had 3rd HD 6/25 overnight and today is the 4th HD session. Seems to be tolerating it well.  Pt was c/o SOB and we got some fluid off (2 L) this am. Plan for next dialysis Saturday, unless needs more fluid off tomorrow (CXR pend, cath pending).  NSTEMI/ CAD - w/ heart cath showing 3V CAD done at [facility]. Per cardiology.  HTN/ volume - BP's low normal, +crackles on exam. Got 2 L off w/ HD today.  Get CXR.  Hyponatremia - changing to renal diet w/ fluid restriction 1200 cc/d.  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 Tsat done 24%, ferritin 152. Will start esa.  MBD ckd - CCa in range. Getting phoslo at 1 ac tid. F/u phos improving w/ hd. Get pth.  A/C systolic CHF - no vol overload. Echo showed LVEF 30-35%.  DM2 Hypothyroid Anxiety/ depression EColi UTI       Vinson Moselle MD  [facility] 06/18/2023, 3:34 PM  Recent Labs  Lab 06/13/23 0433 06/14/23 0621 06/16/23 1700 06/17/23 0536 06/18/23 0556  HGB 8.7*   < > 8.1* 9.1* 7.6*  ALBUMIN 3.4*  --   --   --  2.5*  CALCIUM 9.4   < > 9.1 9.0 8.7*  9.0  PHOS 8.0*   --  6.2*  --  5.8*  CREATININE 4.68*   < > 4.71* 2.94* 3.67*  3.69*  K 4.8   < > 4.6 4.0 3.8  3.9   < > = values in this interval not displayed.    Recent Labs  Lab 06/15/23 0413  IRON 69  TIBC 287  FERRITIN 152    Inpatient medications:  sodium chloride   Intravenous Once   sodium chloride   Intravenous Once   amiodarone  200 mg Oral BID   aspirin EC  81 mg Oral Daily   atorvastatin  80 mg Oral Daily   calcium acetate  667 mg Oral TID WC   Chlorhexidine Gluconate Cloth  6 each Topical Q0600   clopidogrel  75 mg Oral Daily   darbepoetin (ARANESP) injection - DIALYSIS  60 mcg Subcutaneous Q Thu-1800   FLUoxetine  10 mg Oral Once per day on Mon Wed Fri   furosemide  80 mg Oral BID   heparin  5,000 Units Subcutaneous Q8H  insulin aspart  0-15 Units Subcutaneous TID WC   levothyroxine  75 mcg Oral QAC breakfast   metoprolol succinate  25 mg Oral Daily   sodium bicarbonate  650 mg Oral BID   sodium chloride flush  3 mL Intravenous Q12H   sodium chloride flush  3 mL Intravenous Q12H   sodium chloride flush  3 mL Intravenous Q12H    sodium chloride     sodium chloride     albumin human     anticoagulant sodium citrate     sodium chloride, sodium chloride, acetaminophen, albumin human, alteplase, anticoagulant sodium citrate, heparin, methocarbamol, nitroGLYCERIN, ondansetron (ZOFRAN) IV, sodium chloride flush, sodium chloride flush

## 2023-06-18 NOTE — Plan of Care (Signed)
Patient remains in CVICU at this time. Patient is no longer requiring supplemental O2. No active continuous infusions. HD cath to right femoral vein. Defunct AVF to LUE. Patient ambulated in room with walker with this RN without desaturation or ectopy.   Problem: Education: Goal: Ability to describe self-care measures that may prevent or decrease complications (Diabetes Survival Skills Education) will improve Outcome: Progressing Goal: Individualized Educational Video(s) Outcome: Progressing   Problem: Coping: Goal: Ability to adjust to condition or change in health will improve Outcome: Progressing   Problem: Fluid Volume: Goal: Ability to maintain a balanced intake and output will improve Outcome: Progressing   Problem: Health Behavior/Discharge Planning: Goal: Ability to identify and utilize available resources and services will improve Outcome: Progressing Goal: Ability to manage health-related needs will improve Outcome: Progressing   Problem: Metabolic: Goal: Ability to maintain appropriate glucose levels will improve Outcome: Progressing   Problem: Nutritional: Goal: Maintenance of adequate nutrition will improve Outcome: Progressing Goal: Progress toward achieving an optimal weight will improve Outcome: Progressing   Problem: Skin Integrity: Goal: Risk for impaired skin integrity will decrease Outcome: Progressing   Problem: Tissue Perfusion: Goal: Adequacy of tissue perfusion will improve Outcome: Progressing   Problem: Education: Goal: Understanding of cardiac disease, CV risk reduction, and recovery process will improve Outcome: Progressing Goal: Individualized Educational Video(s) Outcome: Progressing   Problem: Activity: Goal: Ability to tolerate increased activity will improve Outcome: Progressing   Problem: Cardiac: Goal: Ability to achieve and maintain adequate cardiovascular perfusion will improve Outcome: Progressing   Problem: Health  Behavior/Discharge Planning: Goal: Ability to safely manage health-related needs after discharge will improve Outcome: Progressing   Problem: Education: Goal: Understanding of CV disease, CV risk reduction, and recovery process will improve Outcome: Progressing Goal: Individualized Educational Video(s) Outcome: Progressing   Problem: Activity: Goal: Ability to return to baseline activity level will improve Outcome: Progressing   Problem: Cardiovascular: Goal: Ability to achieve and maintain adequate cardiovascular perfusion will improve Outcome: Progressing Goal: Vascular access site(s) Level 0-1 will be maintained Outcome: Progressing   Problem: Health Behavior/Discharge Planning: Goal: Ability to safely manage health-related needs after discharge will improve Outcome: Progressing   Problem: Education: Goal: Knowledge of General Education information will improve Description: Including pain rating scale, medication(s)/side effects and non-pharmacologic comfort measures Outcome: Progressing   Problem: Health Behavior/Discharge Planning: Goal: Ability to manage health-related needs will improve Outcome: Progressing   Problem: Clinical Measurements: Goal: Ability to maintain clinical measurements within normal limits will improve Outcome: Progressing Goal: Will remain free from infection Outcome: Progressing Goal: Diagnostic test results will improve Outcome: Progressing Goal: Respiratory complications will improve Outcome: Progressing Goal: Cardiovascular complication will be avoided Outcome: Progressing   Problem: Activity: Goal: Risk for activity intolerance will decrease Outcome: Progressing   Problem: Nutrition: Goal: Adequate nutrition will be maintained Outcome: Progressing   Problem: Coping: Goal: Level of anxiety will decrease Outcome: Progressing   Problem: Elimination: Goal: Will not experience complications related to bowel motility Outcome:  Progressing Goal: Will not experience complications related to urinary retention Outcome: Progressing   Problem: Pain Managment: Goal: General experience of comfort will improve Outcome: Progressing   Problem: Safety: Goal: Ability to remain free from injury will improve Outcome: Progressing   Problem: Skin Integrity: Goal: Risk for impaired skin integrity will decrease Outcome: Progressing   Problem: Education: Goal: Knowledge of disease and its progression will improve Outcome: Progressing Goal: Individualized Educational Video(s) Outcome: Progressing   Problem: Fluid Volume: Goal: Compliance with measures to maintain balanced  fluid volume will improve Outcome: Progressing   Problem: Health Behavior/Discharge Planning: Goal: Ability to manage health-related needs will improve Outcome: Progressing   Problem: Nutritional: Goal: Ability to make healthy dietary choices will improve Outcome: Progressing   Problem: Clinical Measurements: Goal: Complications related to the disease process, condition or treatment will be avoided or minimized Outcome: Progressing

## 2023-06-18 NOTE — Progress Notes (Signed)
Rounding Note    Patient Name: Cassandra Baker Date of Encounter: 06/18/2023  [facility] Cardiologist: Julien Nordmann, MD   Subjective   Patient status post complex PCI of the right coronary artery yesterday with intravascular lithotripsy and stenting.  Complains of muscle cramps in her legs this morning.  No chest pain or shortness of breath.  Inpatient Medications    Scheduled Meds:  sodium chloride   Intravenous Once   amiodarone  200 mg Oral BID   aspirin EC  81 mg Oral Daily   atorvastatin  80 mg Oral Daily   calcium acetate  667 mg Oral TID WC   cephALEXin  500 mg Oral BID   Chlorhexidine Gluconate Cloth  6 each Topical Q0600   clopidogrel  75 mg Oral Daily   FLUoxetine  10 mg Oral Once per day on Mon Wed Fri   furosemide  80 mg Oral BID   heparin  5,000 Units Subcutaneous Q8H   insulin aspart  0-15 Units Subcutaneous TID WC   levothyroxine  75 mcg Oral QAC breakfast   metoprolol succinate  25 mg Oral Daily   sodium bicarbonate  650 mg Oral BID   sodium chloride flush  3 mL Intravenous Q12H   sodium chloride flush  3 mL Intravenous Q12H   sodium chloride flush  3 mL Intravenous Q12H   Continuous Infusions:  sodium chloride     sodium chloride     anticoagulant sodium citrate     PRN Meds: sodium chloride, sodium chloride, acetaminophen, alteplase, anticoagulant sodium citrate, heparin, heparin, nitroGLYCERIN, ondansetron (ZOFRAN) IV, sodium chloride flush, sodium chloride flush   Vital Signs    Vitals:   06/18/23 0400 06/18/23 0500 06/18/23 0600 06/18/23 0700  BP: (!) 145/60 133/62 (!) 136/57 (!) 144/64  Pulse: (!) 53 (!) 49 (!) 58 67  Resp: 13 20 (!) 31 (!) 21  Temp: 97.8 F (36.6 C)   (!) 97.5 F (36.4 C)  TempSrc: Oral   Oral  SpO2: 99% 97% 97% 97%  Weight:  63.4 kg      Intake/Output Summary (Last 24 hours) at 06/18/2023 0813 Last data filed at 06/18/2023 0300 Gross per 24 hour  Intake 212.86 ml  Output 500 ml  Net -287.14 ml       06/18/2023    5:00 AM 06/17/2023    1:05 AM 06/16/2023    4:22 AM  Last 3 Weights  Weight (lbs) 139 lb 12.4 oz 133 lb 2.5 oz 157 lb 10.1 oz  Weight (kg) 63.4 kg 60.4 kg 71.5 kg      Telemetry    Sinus rhythm with PVCs, periods of ventricular bigeminy - Personally Reviewed  ECG    Normal sinus rhythm 68 bpm, cannot rule out inferior infarct age undetermined - Personally Reviewed  Physical Exam  Elderly woman GEN: No acute distress.   Neck: No JVD Cardiac: RRR, no murmurs, rubs, or gallops.  Respiratory: Clear to auscultation bilaterally. GI: Soft, nontender, non-distended  MS: Mild bilateral lower extremity edema; No deformity. Neuro:  Nonfocal  Psych: Normal affect   Labs    High Sensitivity Troponin:   Recent Labs  Lab 06/03/23 2353 06/04/23 0145 06/05/23 1421 06/15/23 2237 06/16/23 0013  TROPONINIHS 986* 998* 993* 180* 209*     Chemistry Recent Labs  Lab 06/11/23 1322 06/12/23 0413 06/13/23 0433 06/15/23 0413 06/16/23 0420 06/16/23 0421 06/16/23 1700 06/17/23 0536 06/18/23 0556  NA 131*   < > 133*   < >  --    < >  122* 130* 128*  K 5.1   < > 4.8   < >  --    < > 4.6 4.0 3.9  CL 95*   < > 96*   < >  --    < > 86* 89* 88*  CO2 21*   < > 24   < >  --    < > 23 25 23   GLUCOSE 146*   < > 132*   < >  --    < > 146* 123* 91  BUN 97*   < > 70*   < >  --    < > 58* 26* 34*  CREATININE 6.45*   < > 4.68*   < >  --    < > 4.71* 2.94* 3.69*  CALCIUM 9.7   < > 9.4   < >  --    < > 9.1 9.0 9.0  MG  --   --  2.5*  --  2.2  --   --   --   --   PROT 6.6  --   --   --   --   --   --   --   --   ALBUMIN 3.7  --  3.4*  --   --   --   --   --   --   AST 15  --   --   --   --   --   --   --   --   ALT 28  --   --   --   --   --   --   --   --   ALKPHOS 106  --   --   --   --   --   --   --   --   BILITOT 0.6  --   --   --   --   --   --   --   --   GFRNONAA 6*   < > 9*   < >  --    < > 9* 15* 12*  ANIONGAP 15   < > 13   < >  --    < > 13 16* 17*   < > = values in this  interval not displayed.    Lipids No results for input(s): "CHOL", "TRIG", "HDL", "LABVLDL", "LDLCALC", "CHOLHDL" in the last 168 hours.  Hematology Recent Labs  Lab 06/16/23 1700 06/17/23 0536 06/18/23 0556  WBC 10.6* 9.9 7.2  RBC 2.69* 2.94* 2.41*  HGB 8.1* 9.1* 7.6*  HCT 25.5* 27.9* 23.2*  MCV 94.8 94.9 96.3  MCH 30.1 31.0 31.5  MCHC 31.8 32.6 32.8  RDW 13.5 13.7 14.1  PLT 183 137* 113*   Thyroid No results for input(s): "TSH", "FREET4" in the last 168 hours.  BNP Recent Labs  Lab 06/11/23 1322  BNP 2,338.2*    DDimer No results for input(s): "DDIMER" in the last 168 hours.   Radiology    PERIPHERAL VASCULAR CATHETERIZATION  Result Date: 06/17/2023   Mid LAD lesion is 80% stenosed.   Mid Cx to Dist Cx lesion is 90% stenosed.   Ramus lesion is 60% stenosed.   RPDA lesion is 90% stenosed.   Prox RCA lesion is 80% stenosed.   Prox RCA to Mid RCA lesion is 99% stenosed.   Mid RCA lesion is 70% stenosed.   RPAV lesion is 60% stenosed.   A drug-eluting stent was successfully  placed using a SYNERGY XD 3.0X48.   A drug-eluting stent was successfully placed using a SYNERGY XD 3.50X16.   Post intervention, there is a 0% residual stenosis.   Post intervention, there is a 0% residual stenosis.   Post intervention, there is a 0% residual stenosis. Moderately to severely elevated left ventricular end-diastolic pressure at 32 mmHg. 2. Successful complex intravascular lithotripsy and 2 overlapped drug-eluting stent placement to the mid and proximal right coronary artery.  Very difficult procedure overall due to heavy calcifications and difficulty crossing the stenosis in the mid right coronary artery.  The distal RCA bifurcation disease appeared better than before likely due to improved upstream flow and thus I elected not to treat. 3.  The patient did go into polymorphic ventricular tachycardia that lasted for about 10 seconds before starting the procedure.  She converted and did not require  defibrillation. 4.  Difficulty sedating the patient with continuous involuntary movements of her lower extremities throughout the case. Recommendations: Dual antiplatelet therapy for at least 12 months. I discontinued heparin drip. I switch amiodarone drip to oral. Recommend treating the LAD disease medically.   CARDIAC CATHETERIZATION  Result Date: 06/17/2023   Mid LAD lesion is 80% stenosed.   Mid Cx to Dist Cx lesion is 90% stenosed.   Ramus lesion is 60% stenosed.   RPDA lesion is 90% stenosed.   Prox RCA lesion is 80% stenosed.   Prox RCA to Mid RCA lesion is 99% stenosed.   Mid RCA lesion is 70% stenosed.   RPAV lesion is 60% stenosed.   A drug-eluting stent was successfully placed using a SYNERGY XD 3.0X48.   A drug-eluting stent was successfully placed using a SYNERGY XD 3.50X16.   Post intervention, there is a 0% residual stenosis.   Post intervention, there is a 0% residual stenosis.   Post intervention, there is a 0% residual stenosis. Moderately to severely elevated left ventricular end-diastolic pressure at 32 mmHg. 2. Successful complex intravascular lithotripsy and 2 overlapped drug-eluting stent placement to the mid and proximal right coronary artery.  Very difficult procedure overall due to heavy calcifications and difficulty crossing the stenosis in the mid right coronary artery.  The distal RCA bifurcation disease appeared better than before likely due to improved upstream flow and thus I elected not to treat. 3.  The patient did go into polymorphic ventricular tachycardia that lasted for about 10 seconds before starting the procedure.  She converted and did not require defibrillation. 4.  Difficulty sedating the patient with continuous involuntary movements of her lower extremities throughout the case. Recommendations: Dual antiplatelet therapy for at least 12 months. I discontinued heparin drip. I switch amiodarone drip to oral. Recommend treating the LAD disease medically.    Cardiac  Studies   Cardiac catheterization films reviewed as above  Patient Profile     82 y.o. female with recent progression to end-stage renal disease, chronic combined systolic and diastolic heart failure, presenting with volume overload, non-STEMI, and polymorphic VT, transferred to [facility] for high risk PCI after found to have severe multivessel CAD with complex calcified coronary anatomy  Assessment & Plan    1.  Polymorphic VT: Suspect ischemic mediated.  Managed with IV amiodarone, transition to oral amiodarone yesterday.  Telemetry reviewed and demonstrates PVCs but no sustained arrhythmia or runs of VT.  Continue oral amiodarone.  Continue metoprolol succinate. 2.  Non-STEMI: Patient status post PCI of the right coronary artery yesterday.  Treated with aspirin and clopidogrel.  Medical  therapy plan for residual CAD.  Treated with atorvastatin 80 mg daily and metoprolol succinate. 3.  Heart failure with reduced ejection fraction: Limited treatment options in the setting of her recent progression to end-stage renal disease and low blood pressure.  Currently on low-dose beta-blockade.  Volume management with dialysis. 4.  End-stage renal disease: Appreciate nephrology care 5.  Anemia, acute on chronic: Received 1 unit of packed red blood cells 6/25.  Hemoglobin 7.6 this morning.  Likely will need another unit of PRBCs with dialysis.  For questions or updates, please contact [facility] Please consult [email or web address] for contact info under        Signed, Tonny Bollman, MD  06/18/2023, 8:13 AM

## 2023-06-18 NOTE — Progress Notes (Signed)
Met with pt and pt's son, Cassandra Baker, at bedside. Discussed pt's out-pt HD clinic and schedule [facility] TTS 11:30 chair time). Pt advised navigator that she will need a MWF schedule for religious reasons (pt cannot treat on Saturdays). Advised pt and pt's son that navigator will request a MWF schedule for that reason. Pt and pt's son went on to advise navigator that pt's d/c plan is currently undecided. Pt and family to determine if pt will return home or if pt will need to go stay with family at d/c. If pt goes to stay with family, then it is likely that pt will need to be placed at a clinic close to family's home. This information was provided to CSW and nephrologist. Will assist with clinic placement and schedule once d/c plan is confirmed. Will assist as needed.   Olivia Canter Renal Navigator [phone]

## 2023-06-18 NOTE — Progress Notes (Signed)
Pt undergoing dialysis, will return later.  Cassandra Baker 06/18/2023 9:32 AM

## 2023-06-19 ENCOUNTER — Encounter (HOSPITAL_COMMUNITY): Payer: Self-pay | Admitting: Cardiovascular Disease

## 2023-06-19 ENCOUNTER — Inpatient Hospital Stay (HOSPITAL_COMMUNITY): Payer: Medicare Other

## 2023-06-19 DIAGNOSIS — I214 Non-ST elevation (NSTEMI) myocardial infarction: Secondary | ICD-10-CM | POA: Diagnosis not present

## 2023-06-19 DIAGNOSIS — N186 End stage renal disease: Secondary | ICD-10-CM

## 2023-06-19 LAB — CBC
HCT: 28.9 % — ABNORMAL LOW (ref 36.0–46.0)
Hemoglobin: 9.2 g/dL — ABNORMAL LOW (ref 12.0–15.0)
MCH: 29.3 pg (ref 26.0–34.0)
MCHC: 31.8 g/dL (ref 30.0–36.0)
MCV: 92 fL (ref 80.0–100.0)
Platelets: 121 10*3/uL — ABNORMAL LOW (ref 150–400)
RBC: 3.14 MIL/uL — ABNORMAL LOW (ref 3.87–5.11)
RDW: 16 % — ABNORMAL HIGH (ref 11.5–15.5)
WBC: 8.5 10*3/uL (ref 4.0–10.5)
nRBC: 0 % (ref 0.0–0.2)

## 2023-06-19 LAB — GLUCOSE, CAPILLARY
Glucose-Capillary: 143 mg/dL — ABNORMAL HIGH (ref 70–99)
Glucose-Capillary: 192 mg/dL — ABNORMAL HIGH (ref 70–99)
Glucose-Capillary: 189 mg/dL — ABNORMAL HIGH (ref 70–99)
Glucose-Capillary: 119 mg/dL — ABNORMAL HIGH (ref 70–99)
Glucose-Capillary: 140 mg/dL — ABNORMAL HIGH (ref 70–99)

## 2023-06-19 LAB — TYPE AND SCREEN

## 2023-06-19 LAB — BPAM RBC
Blood Product Expiration Date: 202407102359
Unit Type and Rh: 6200
Unit Type and Rh: 6200

## 2023-06-19 MED ORDER — HEPARIN SODIUM (PORCINE) 1000 UNIT/ML DIALYSIS
2500.0000 [IU] | INTRAMUSCULAR | Status: DC | PRN
  Filled 2023-06-19: qty 3

## 2023-06-19 MED ORDER — VANCOMYCIN HCL IN DEXTROSE 1-5 GM/200ML-% IV SOLN: 1000.0000 mg | INTRAVENOUS | Status: DC

## 2023-06-19 MED ORDER — RENA-VITE PO TABS
1.0000 | ORAL_TABLET | Freq: Every day | ORAL | Status: DC
  Administered 2023-06-19 – 2023-06-27 (×9): 1 via ORAL
  Filled 2023-06-19 (×9): qty 1

## 2023-06-19 MED ORDER — CEFAZOLIN SODIUM-DEXTROSE 2-4 GM/100ML-% IV SOLN
2.0000 g | INTRAVENOUS | Status: AC
  Administered 2023-06-22: 2 g via INTRAVENOUS
  Filled 2023-06-19: qty 100

## 2023-06-19 MED ORDER — NEPRO/CARBSTEADY PO LIQD
237.0000 mL | Freq: Two times a day (BID) | ORAL | Status: DC
  Administered 2023-06-23 – 2023-06-26 (×3): 237 mL via ORAL
  Filled 2023-06-19 (×2): qty 237

## 2023-06-19 MED ORDER — ORAL CARE MOUTH RINSE: 15.0000 mL | OROMUCOSAL | Status: DC | PRN

## 2023-06-19 MED ORDER — CHLORHEXIDINE GLUCONATE CLOTH 2 % EX PADS
6.0000 | MEDICATED_PAD | Freq: Every day | CUTANEOUS | Status: DC
  Administered 2023-06-20 – 2023-06-28 (×9): 6 via TOPICAL

## 2023-06-19 MED ORDER — HEPARIN SODIUM (PORCINE) 1000 UNIT/ML DIALYSIS
2500.0000 [IU] | Freq: Once | INTRAMUSCULAR | Status: AC
  Administered 2023-06-20: 2500 [IU] via INTRAVENOUS_CENTRAL
  Filled 2023-06-19: qty 3

## 2023-06-19 NOTE — Plan of Care (Signed)
  Problem: Education: Goal: Ability to describe self-care measures that may prevent or decrease complications (Diabetes Survival Skills Education) will improve Outcome: Progressing Goal: Individualized Educational Video(s) Outcome: Progressing   Problem: Coping: Goal: Ability to adjust to condition or change in health will improve Outcome: Progressing   Problem: Fluid Volume: Goal: Ability to maintain a balanced intake and output will improve Outcome: Progressing   Problem: Health Behavior/Discharge Planning: Goal: Ability to identify and utilize available resources and services will improve Outcome: Progressing Goal: Ability to manage health-related needs will improve Outcome: Progressing   Problem: Metabolic: Goal: Ability to maintain appropriate glucose levels will improve Outcome: Progressing   Problem: Nutritional: Goal: Maintenance of adequate nutrition will improve Outcome: Progressing   Problem: Skin Integrity: Goal: Risk for impaired skin integrity will decrease Outcome: Progressing   Problem: Tissue Perfusion: Goal: Adequacy of tissue perfusion will improve Outcome: Progressing   Problem: Education: Goal: Understanding of cardiac disease, CV risk reduction, and recovery process will improve Outcome: Progressing Goal: Individualized Educational Video(s) Outcome: Progressing   Problem: Activity: Goal: Ability to tolerate increased activity will improve Outcome: Progressing   Problem: Cardiac: Goal: Ability to achieve and maintain adequate cardiovascular perfusion will improve Outcome: Progressing   Problem: Health Behavior/Discharge Planning: Goal: Ability to safely manage health-related needs after discharge will improve Outcome: Progressing   Problem: Education: Goal: Understanding of CV disease, CV risk reduction, and recovery process will improve Outcome: Progressing

## 2023-06-19 NOTE — Progress Notes (Signed)
Approximately 1630--Pt arrived to room 3East24 from [facility]. Upon arrival, pt transferred into pt bed. Pt A&O x 4, VS obtained. Telemetry connected and notified. Full assessment completed by this RN. Pt oriented to room and unit. All fall precautions in place. Pt joined by her granddaughter, Jana Half.

## 2023-06-19 NOTE — Consult Note (Cosign Needed)
Hospital Consult    Reason for Consult:  dialysis access Requesting Physician:  ICU MRN #:  [id number]  History of Present Illness: Cassandra Baker is a 82 y.o. 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  She was admitted to [facility] on 06/04/2023 with acute combined CHF, NSTEMI, and AKI on CKD. She left AMA prior to getting a cardiac cath or starting dialysis. She was readmitted to [facility] on 6/20 with volume overload. A temporary right femoral dialysis catheter was placed by vascular the next day and she began dialysis.  She was transferred to [facility] on 6/25 and underwent complex PCI of the right coronary artery with intravascular lithotripsy and stenting on 6/26.  We were consulted for permanent dialysis access.  She has previously had a left brachiocephalic AV fistula creation by Dr. Myra Gianotti in May 2020, with superficialization in October 2020.  Most recently we saw the patient this May for her nearly occluded fistula.  At that time she was not on dialysis and did not want to be on dialysis, so she denied intervention.   On exam today she is alert and pleasant.  She decided over the past week that she wanted to be on dialysis so she could "get more things done in life".  She is currently dialyzing through her temporary right femoral catheter and IR has been consulted for internal jugular TDC placement.  She is agreeable for further surgeries for dialysis access.  She is right-handed.  Past Medical History:  Diagnosis Date   Anemia    Arthritis    low back,knees   Asthma    Basal cell carcinoma (BCC)    a. nose   CHF (congestive heart failure) (HCC)    a. prev eval by Dr. Katrinka Blazing in [place] - "many yrs ago."   CKD (chronic kidney disease), stage IV (HCC)    Depression    Diabetes mellitus without complication (HCC)    Dyspnea    Hypertension    Hypothyroidism     Past Surgical History:  Procedure Laterality Date    APPENDECTOMY     4 th grade   AV FISTULA PLACEMENT Left 05/12/2019   Procedure: BRACHIOCEPHALIC ARTERIOVENOUS (AV) FISTULA CREATION LEFT ARM;  Surgeon: Nada Libman, MD;  Location: [facility] OR;  Service: Vascular;  Laterality: Left;   CORONARY STENT INTERVENTION N/A 06/17/2023   Procedure: CORONARY STENT INTERVENTION;  Surgeon: Iran Ouch, MD;  Location: [facility] INVASIVE CV LAB;  Service: Cardiovascular;  Laterality: N/A;   EYE SURGERY  2014   Bilateral 6 mos. apart  cataracts   FISTULA SUPERFICIALIZATION Left 10/06/2019   Procedure: ELEVATION OF ARTERIOVENOUS FISTULA LEFT ARM;  Surgeon: Nada Libman, MD;  Location: [facility] OR;  Service: Vascular;  Laterality: Left;   LYMPH NODE BIOPSY N/A 02/09/2018   Procedure: SENTINEL LYMPH NODE BIOPSY;  Surgeon: Adolphus Birchwood, MD;  Location: [facility] ORS;  Service: Gynecology;  Laterality: N/A;   PARATHYROIDECTOMY N/A 12/07/2014   Procedure: PARATHYROIDECTOMY;  Surgeon: Darnell Level, MD;  Location: [facility] ORS;  Service: General;  Laterality: N/A;   PERIPHERAL INTRAVASCULAR LITHOTRIPSY  06/17/2023   Procedure: PERIPHERAL INTRAVASCULAR LITHOTRIPSY;  Surgeon: Iran Ouch, MD;  Location: [facility] INVASIVE CV LAB;  Service: Cardiovascular;;   RIGHT/LEFT HEART CATH AND CORONARY ANGIOGRAPHY N/A 06/15/2023   Procedure: RIGHT/LEFT HEART CATH AND CORONARY ANGIOGRAPHY;  Surgeon: Iran Ouch, MD;  Location: [facility] INVASIVE CV LAB;  Service: Cardiovascular;  Laterality:  N/A;   ROBOTIC ASSISTED TOTAL HYSTERECTOMY WITH BILATERAL SALPINGO OOPHERECTOMY Bilateral 02/09/2018   Procedure: XI ROBOTIC ASSISTED TOTAL HYSTERECTOMY WITH BILATERAL SALPINGO OOPHORECTOMY;  Surgeon: Adolphus Birchwood, MD;  Location: [facility] ORS;  Service: Gynecology;  Laterality: Bilateral;   TEMPORARY DIALYSIS CATHETER N/A 06/12/2023   Procedure: TEMPORARY DIALYSIS CATHETER;  Surgeon: Renford Dills, MD;  Location: [facility] INVASIVE CV LAB;  Service: Cardiovascular;  Laterality: N/A;   THYROID LOBECTOMY Left 12/07/2014    Procedure: LEFT THYROID LOBECTOMY;  Surgeon: Darnell Level, MD;  Location: [facility] ORS;  Service: General;  Laterality: Left;   TONSILLECTOMY     TUBAL LIGATION     Early thirties    Allergies  Allergen Reactions   Plum Pulp Anaphylaxis and Cough   Citrus     Coughing, chest tightness    Other Itching    Dairy products/Soaps & detergents   Penicillins Rash    Tolerates ceftriaxone    Prior to Admission medications   Medication Sig Start Date End Date Taking? Authorizing Provider  acetaminophen (TYLENOL) 500 MG tablet Take 500-1,000 mg by mouth every 6 (six) hours as needed (pain).    Yes [provider]  allopurinol (ZYLOPRIM) 100 MG tablet Take 100 mg by mouth daily as needed (For gout).   Yes [provider]  aspirin EC 81 MG tablet Take 1 tablet (81 mg total) by mouth daily. Swallow whole. 06/16/23  Yes Lurene Shadow, MD  atorvastatin (LIPITOR) 80 MG tablet Take 1 tablet (80 mg total) by mouth daily. 06/16/23  Yes Lurene Shadow, MD  calcium acetate (PHOSLO) 667 MG tablet Take 667 mg by mouth 3 (three) times daily.   Yes [provider]  FLUoxetine (PROZAC) 10 MG capsule Take 10 mg by mouth daily. At night   Yes [provider]  furosemide (LASIX) 40 MG tablet Take 80 mg by mouth 2 (two) times daily.    Yes [provider]  hydrOXYzine (ATARAX/VISTARIL) 10 MG tablet Take 10 mg by mouth at bedtime as needed for itching.   Yes [provider]  levothyroxine (SYNTHROID) 75 MCG tablet Take 75 mcg by mouth daily before breakfast.   Yes [provider]  loratadine (CLARITIN) 5 MG chewable tablet Chew 5 mg by mouth daily as needed for allergies.   Yes [provider]  metoprolol succinate (TOPROL-XL) 25 MG 24 hr tablet Take 1 tablet (25 mg total) by mouth daily. Take with or immediately following a meal. Patient taking differently: Take 25 mg by mouth daily. 06/17/23  Yes Lurene Shadow, MD  sitaGLIPtin (JANUVIA) 25 MG tablet  Take 25 mg by mouth daily.   Yes [provider]  sodium bicarbonate 650 MG tablet Take 650 mg by mouth 2 (two) times daily. 04/26/19  Yes [provider]    Social History   Socioeconomic History   Marital status: Married    Spouse name: Not on file   Number of children: Not on file   Years of education: Not on file   Highest education level: Not on file  Occupational History   Not on file  Tobacco Use   Smoking status: Never   Smokeless tobacco: Never  Vaping Use   Vaping Use: Never used  Substance and Sexual Activity   Alcohol use: No   Drug use: No   Sexual activity: Not Currently    Birth control/protection: Post-menopausal  Other Topics Concern   Not on file  Social History Narrative   Lives in [place].  Does not routinely exercise.   Social Determinants of Health   Financial Resource Strain: Not on file  Food Insecurity: No Food Insecurity (06/11/2023)   Hunger Vital Sign    Worried About Running Out of Food in the Last Year: Never true    Ran Out of Food in the Last Year: Never true  Transportation Needs: No Transportation Needs (06/11/2023)   PRAPARE - Administrator, Civil Service (Medical): No    Lack of Transportation (Non-Medical): No  Physical Activity: Not on file  Stress: Not on file  Social Connections: Not on file  Intimate Partner Violence: Unknown (06/11/2023)   Humiliation, Afraid, Rape, and Kick questionnaire    Fear of Current or Ex-Partner: Patient declined    Emotionally Abused: No    Physically Abused: No    Sexually Abused: No     Family History  Problem Relation Age of Onset   Hypertension Mother        MI's beginning in 60's, died @ 79   Heart disease Mother    Hypertension Father    Heart disease Father    Heart disease Brother    Stroke Brother    Cervical cancer Maternal Aunt    Heart disease Brother    Stroke Brother     ROS: Otherwise negative unless mentioned in HPI  Physical  Examination  Vitals:   06/19/23 0830 06/19/23 0845  BP:    Pulse: 69 70  Resp: 19 15  Temp:    SpO2: 96% 98%   Body mass index is 25.43 kg/m.  General:  WDWN in NAD Gait: Not observed HENT: WNL, normocephalic Pulmonary: normal non-labored breathing Cardiac: regular Abdomen:  soft, NT/ND, no masses Skin: without rashes Vascular Exam/Pulses: palpable radial and brachial pulses bilaterally Extremities: Left brachiocephalic fistula is pulsatile in the AC fossa, no palpable thrill above the AC fossa or in the proximal upper arm Musculoskeletal: no muscle wasting or atrophy  Neurologic: A&O X 3;  No focal weakness or paresthesias are detected; speech is fluent/normal Psychiatric:  The pt has Normal affect. Lymph:  Unremarkable  CBC    Component Value Date/Time   WBC 8.5 06/19/2023 0527   RBC 3.14 (L) 06/19/2023 0527   HGB 9.2 (L) 06/19/2023 0527   HGB 12.0 03/03/2014 1004   HCT 28.9 (L) 06/19/2023 0527   HCT 35.9 03/03/2014 1004   PLT 121 (L) 06/19/2023 0527   PLT 177 03/03/2014 1004   MCV 92.0 06/19/2023 0527   MCV 84 03/03/2014 1004   MCH 29.3 06/19/2023 0527   MCHC 31.8 06/19/2023 0527   RDW 16.0 (H) 06/19/2023 0527   RDW 15.3 (H) 03/03/2014 1004   LYMPHSABS 0.7 06/13/2023 0433   MONOABS 0.8 06/13/2023 0433   EOSABS 0.3 06/13/2023 0433   BASOSABS 0.1 06/13/2023 0433    BMET    Component Value Date/Time   NA 128 (L) 06/18/2023 0556   NA 124 (L) 06/18/2023 0556   NA 136 03/03/2014 1004   K 3.9 06/18/2023 0556   K 3.8 06/18/2023 0556   K 4.4 03/03/2014 1004   CL 88 (L) 06/18/2023 0556   CL 89 (L) 06/18/2023 0556   CL 106 03/03/2014 1004   CO2 23 06/18/2023 0556   CO2 24 06/18/2023 0556   CO2 25 03/03/2014 1004   GLUCOSE 91 06/18/2023 0556   GLUCOSE 88 06/18/2023 0556   GLUCOSE 212 (H) 03/03/2014 1004   BUN 34 (H) 06/18/2023 0556   BUN 32 (H)  06/18/2023 0556   BUN 44 (H) 03/03/2014 1004   CREATININE 3.69 (H) 06/18/2023 0556   CREATININE 3.67 (H)  06/18/2023 0556   CREATININE 1.86 (H) 03/03/2014 1004   CALCIUM 9.0 06/18/2023 0556   CALCIUM 8.7 (L) 06/18/2023 0556   CALCIUM 9.5 03/03/2014 1004   GFRNONAA 12 (L) 06/18/2023 0556   GFRNONAA 12 (L) 06/18/2023 0556   GFRNONAA 26 (L) 03/03/2014 1004   GFRAA 23 (L) 02/18/2018 1424   GFRAA 31 (L) 03/03/2014 1004    COAGS: Lab Results  Component Value Date   INR 1.3 (H) 06/12/2023   INR 1.2 06/04/2023     Non-Invasive Vascular Imaging:   Vein mapping ordered   ASSESSMENT/PLAN: This is a 82 y.o. female with recent progression to ESRD   -The patient has a history of CKD with recent progression to ESRD.  She is currently dialyzing through a right temporary femoral catheter.  IR has been consulted for [facility] placement -We have seen the patient before and created a left brachiocephalic fistula in May 2020.  The fistula was superficialized in October 2020.  This fistula was never used.  Most recently we saw the patient in our office in May, and duplex of the fistula demonstrated near total occlusion of the fistula in the mid and distal upper arm.  At that time the patient was not on dialysis and therefore denied intervention -On exam today her left brachiocephalic fistula is pulsatile in the [facility] fossa and loses its thrill in the distal upper arm.  This fistula is likely not amenable to rescue -I have explained to the patient she will likely require surgical creation of new dialysis access.  This may potentially be a fistula or graft in the left arm, considering that she is right-handed.  I have ordered vein mapping -The patient has eaten today and therefore surgery cannot be done today.  Potentially surgery can be done sometime next week while she is still inpatient -Dr.Paisli Silfies will see the patient and make further treatment recommendations   Loel Dubonnet PA-C [facility] [phone]  I agree with the above.  Have seen and evaluated the patient.  Briefly this is an  82 year old female new onset end-stage renal disease.  I had previously placed a left brachiocephalic fistula that has thrombosed.  She needs new access.  Vein mapping suggests an adequate left basilic vein.  I discussed doing this in 2 stages.  She also needs a catheter.  Risks of the procedure including steal and bleeding were discussed.  She will remain on aspirin and Plavix.  All questions were answered.  She will be n.p.o. after midnight.  She is on the schedule for interventional radiology to place her catheter however I spoke with Dr. Chales Abrahams, who will cancel the radiology catheter and we will do all this in the operating room.  Durene Cal

## 2023-06-19 NOTE — Progress Notes (Signed)
Upper extremity venous pre-op mapping study completed.  ? ?Please see CV Proc for preliminary results.  ? ?Abishai Viegas, RDMS, RVT ? ?

## 2023-06-19 NOTE — Progress Notes (Signed)
[facility] Progress Note  Subjective: seen in room. Pt looking a lot better overall  Vitals:   06/19/23 1300 06/19/23 1400 06/19/23 1500 06/19/23 1512  BP: (!) 121/101 106/60 (!) 78/43 (!) 106/46  Pulse: 64 (!) 57 (!) 51 (!) 56  Resp: 14 12 19  (!) 22  Temp:      TempSrc:      SpO2: 97% 96% 94% 95%  Weight:        Exam: Gen alert, no distress No jvd or bruits Chest clear bilat to bases RRR no MRG Abd soft ntnd no mass or ascites +bs GU defer MS no joint effusions or deformity Ext no LE edema Neuro is alert, Ox 3 , nf    R fem temp HD cath      Home meds include - asa, lipitor, toprol xl 25 every day, zyloprim, phoslo 1 ac tid, prozac, lasix 80 bid, synthroid, sitagliptin, sod bicarb, prns/ vits/ supps    CXR 6/27 - IMPRESSION: 1. New small bilateral pleural effusions with bibasilar atelectasis.       Assessment/ Plan: CKD V - pt is new ESRD, had temp cath placed at [facility] in R fem vein. Had 1st HD 6/21 and 2nd HD 6/24 (shortened to 1 hr by pt) at [facility]. Had 3rd HD 6/25 and the 4th HD session on 6/27 here. Overall she is looking much better. Any uremia or dysequilibrium risk I believe is now behind [place]. Next HD Saturday.  HD access - consulting IR for [facility] and consulting [facility] for permanent access.  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 Underwent PCI to RCA on 6/27.  HTN/ volume - BP's low normal, got 2 L off w/ last HD on 6/27. Repeat UF 2 L w/ HD tomorrow as tolerated.  Hyponatremia - changing to renal diet w/ fluid restriction 1200 cc/d.  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 Tsat done 24%, ferritin 152. Started darbe 60 mcg sq weekly while here.  MBD ckd - CCa in range. Getting phoslo at 1 ac tid. F/u phos improving w/ hd and binders.  Get pth.  A/C systolic CHF - no vol overload. Echo showed LVEF 30-35%.  DM2 Hypothyroid Anxiety/ depression EColi UTI       Vinson Moselle MD  [facility] 06/19/2023, 4:06 PM  Recent Labs  Lab 06/13/23 0433  06/14/23 0621 06/16/23 1700 06/17/23 0536 06/18/23 0556 06/19/23 0527  HGB 8.7*   < > 8.1* 9.1* 7.6* 9.2*  ALBUMIN 3.4*  --   --   --  2.5*  --   CALCIUM 9.4   < > 9.1 9.0 8.7*  9.0  --   PHOS 8.0*  --  6.2*  --  5.8*  --   CREATININE 4.68*   < > 4.71* 2.94* 3.67*  3.69*  --   K 4.8   < > 4.6 4.0 3.8  3.9  --    < > = values in this interval not displayed.    Recent Labs  Lab 06/15/23 0413  IRON 69  TIBC 287  FERRITIN 152    Inpatient medications:  sodium chloride   Intravenous Once   sodium chloride   Intravenous Once   amiodarone  200 mg Oral BID   aspirin EC  81 mg Oral Daily   atorvastatin  80 mg Oral Daily   calcium acetate  667 mg Oral TID WC   Chlorhexidine Gluconate Cloth  6 each Topical Q0600  clopidogrel  75 mg Oral Daily   darbepoetin (ARANESP) injection - DIALYSIS  60 mcg Subcutaneous Q Thu-1800   feeding supplement (NEPRO CARB STEADY)  237 mL Oral BID BM   FLUoxetine  10 mg Oral Once per day on Mon Wed Fri   furosemide  80 mg Oral BID   heparin  5,000 Units Subcutaneous Q8H   insulin aspart  0-15 Units Subcutaneous TID WC   levothyroxine  75 mcg Oral QAC breakfast   metoprolol succinate  25 mg Oral Daily   multivitamin  1 tablet Oral QHS   sodium bicarbonate  650 mg Oral BID   sodium chloride flush  3 mL Intravenous Q12H   sodium chloride flush  3 mL Intravenous Q12H   sodium chloride flush  3 mL Intravenous Q12H    sodium chloride     sodium chloride     albumin human     anticoagulant sodium citrate     sodium chloride, sodium chloride, acetaminophen, albumin human, alteplase, anticoagulant sodium citrate, heparin, methocarbamol, nitroGLYCERIN, ondansetron (ZOFRAN) IV, mouth rinse, sodium chloride flush, sodium chloride flush

## 2023-06-19 NOTE — Consult Note (Signed)
Chief Complaint: Patient was seen in consultation today for tunneled HD cath placement  at the request of Ayiku,[name]  Referring Physician(s): Ayiku,[name]  Supervising Physician: Gilmer Mor  Patient Status: [name] - In-pt  History of Present Illness: Cassandra Baker is a 82 y.o. 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   Patient was admitted to [facility] on 6/13 with acute combined CHF, NSTEMI, and AKI on CKD, she was scheduled for cardiac cath and initiation of HD but she left AMA. She was readmitted to [facility] on 6/20 with volume overload, temporary right femoral dialysis catheter was placed by vascular surgery on 6/21 and she was started on HD. Patient was transferred to [facility]  and underwent complex PCI of the right coronary artery with intravascular lithotripsy and stenting on 6/26.   Of note patient underwent left brachiocephalic AV fistula creation by Dr. Myra Gianotti in May 2020, with superficialization in October 2020, she was seen by vascular surgery on May 2024 when it was found that her AVF was occluded. Patient declined intervention as she did not require HD in May 2024.   IR was requested for image guided tunneled HD cath placement by nephrology.   Patient laying in bed, not in acute distress.  Reports occasional SOB which she thinks it is due to her kidney disease.  Denise headache, fever, chills, cough, chest pain, abdominal pain, nausea ,vomiting, and bleeding.  Patient is unclear whey right femoral vein was chosen for temp cath placement, she states that she never had catheter placed in her neck veins.     Past Medical History:  Diagnosis Date   Anemia    Arthritis    low back,knees   Asthma    Basal cell carcinoma (BCC)    a. nose   CHF (congestive heart failure) (HCC)    a. prev eval by Dr. Katrinka Blazing in [place] - "many yrs ago."   CKD (chronic kidney disease), stage IV (HCC)    Depression     Diabetes mellitus without complication (HCC)    Dyspnea    Hypertension    Hypothyroidism     Past Surgical History:  Procedure Laterality Date   APPENDECTOMY     4 th grade   AV FISTULA PLACEMENT Left 05/12/2019   Procedure: BRACHIOCEPHALIC ARTERIOVENOUS (AV) FISTULA CREATION LEFT ARM;  Surgeon: Nada Libman, MD;  Location: [facility] OR;  Service: Vascular;  Laterality: Left;   CORONARY STENT INTERVENTION N/A 06/17/2023   Procedure: CORONARY STENT INTERVENTION;  Surgeon: Iran Ouch, MD;  Location: [facility] INVASIVE CV LAB;  Service: Cardiovascular;  Laterality: N/A;   EYE SURGERY  2014   Bilateral 6 mos. apart  cataracts   FISTULA SUPERFICIALIZATION Left 10/06/2019   Procedure: ELEVATION OF ARTERIOVENOUS FISTULA LEFT ARM;  Surgeon: Nada Libman, MD;  Location: [facility] OR;  Service: Vascular;  Laterality: Left;   LYMPH NODE BIOPSY N/A 02/09/2018   Procedure: SENTINEL LYMPH NODE BIOPSY;  Surgeon: Adolphus Birchwood, MD;  Location: [facility] ORS;  Service: Gynecology;  Laterality: N/A;   PARATHYROIDECTOMY N/A 12/07/2014   Procedure: PARATHYROIDECTOMY;  Surgeon: Darnell Level, MD;  Location: [facility] ORS;  Service: General;  Laterality: N/A;   PERIPHERAL INTRAVASCULAR LITHOTRIPSY  06/17/2023   Procedure: PERIPHERAL INTRAVASCULAR LITHOTRIPSY;  Surgeon: Iran Ouch, MD;  Location: [facility] INVASIVE CV LAB;  Service: Cardiovascular;;   RIGHT/LEFT HEART CATH AND CORONARY ANGIOGRAPHY N/A 06/15/2023   Procedure: RIGHT/LEFT  HEART CATH AND CORONARY ANGIOGRAPHY;  Surgeon: Iran Ouch, MD;  Location: [facility] INVASIVE CV LAB;  Service: Cardiovascular;  Laterality: N/A;   ROBOTIC ASSISTED TOTAL HYSTERECTOMY WITH BILATERAL SALPINGO OOPHERECTOMY Bilateral 02/09/2018   Procedure: XI ROBOTIC ASSISTED TOTAL HYSTERECTOMY WITH BILATERAL SALPINGO OOPHORECTOMY;  Surgeon: Adolphus Birchwood, MD;  Location: [facility] ORS;  Service: Gynecology;  Laterality: Bilateral;   TEMPORARY DIALYSIS CATHETER N/A 06/12/2023   Procedure: TEMPORARY DIALYSIS CATHETER;   Surgeon: Renford Dills, MD;  Location: [facility] INVASIVE CV LAB;  Service: Cardiovascular;  Laterality: N/A;   THYROID LOBECTOMY Left 12/07/2014   Procedure: LEFT THYROID LOBECTOMY;  Surgeon: Darnell Level, MD;  Location: [facility] ORS;  Service: General;  Laterality: Left;   TONSILLECTOMY     TUBAL LIGATION     Early thirties    Allergies: Plum pulp, Citrus, Other, and Penicillins  Medications: Prior to Admission medications   Medication Sig Start Date End Date Taking? Authorizing Provider  acetaminophen (TYLENOL) 500 MG tablet Take 500-1,000 mg by mouth every 6 (six) hours as needed (pain).    Yes [provider]  allopurinol (ZYLOPRIM) 100 MG tablet Take 100 mg by mouth daily as needed (For gout).   Yes [provider]  aspirin EC 81 MG tablet Take 1 tablet (81 mg total) by mouth daily. Swallow whole. 06/16/23  Yes Lurene Shadow, MD  atorvastatin (LIPITOR) 80 MG tablet Take 1 tablet (80 mg total) by mouth daily. 06/16/23  Yes Lurene Shadow, MD  calcium acetate (PHOSLO) 667 MG tablet Take 667 mg by mouth 3 (three) times daily.   Yes [provider]  FLUoxetine (PROZAC) 10 MG capsule Take 10 mg by mouth daily. At night   Yes [provider]  furosemide (LASIX) 40 MG tablet Take 80 mg by mouth 2 (two) times daily.    Yes [provider]  hydrOXYzine (ATARAX/VISTARIL) 10 MG tablet Take 10 mg by mouth at bedtime as needed for itching.   Yes [provider]  levothyroxine (SYNTHROID) 75 MCG tablet Take 75 mcg by mouth daily before breakfast.   Yes [provider]  loratadine (CLARITIN) 5 MG chewable tablet Chew 5 mg by mouth daily as needed for allergies.   Yes [provider]  metoprolol succinate (TOPROL-XL) 25 MG 24 hr tablet Take 1 tablet (25 mg total) by mouth daily. Take with or immediately following a meal. Patient taking differently: Take 25 mg by mouth daily. 06/17/23  Yes Lurene Shadow, MD  sitaGLIPtin (JANUVIA) 25 MG  tablet Take 25 mg by mouth daily.   Yes [provider]  sodium bicarbonate 650 MG tablet Take 650 mg by mouth 2 (two) times daily. 04/26/19  Yes [provider]     Family History  Problem Relation Age of Onset   Hypertension Mother        MI's beginning in 60's, died @ 3   Heart disease Mother    Hypertension Father    Heart disease Father    Heart disease Brother    Stroke Brother    Cervical cancer Maternal Aunt    Heart disease Brother    Stroke Brother     Social History   Socioeconomic History   Marital status: Married    Spouse name: Not on file   Number of children: Not on file   Years of education: Not on file   Highest education level: Not on file  Occupational History   Not on file  Tobacco Use   Smoking  status: Never   Smokeless tobacco: Never  Vaping Use   Vaping Use: Never used  Substance and Sexual Activity   Alcohol use: No   Drug use: No   Sexual activity: Not Currently    Birth control/protection: Post-menopausal  Other Topics Concern   Not on file  Social History Narrative   Lives in [place].  Does not routinely exercise.   Social Determinants of Health   Financial Resource Strain: Not on file  Food Insecurity: No Food Insecurity (06/11/2023)   Hunger Vital Sign    Worried About Running Out of Food in the Last Year: Never true    Ran Out of Food in the Last Year: Never true  Transportation Needs: No Transportation Needs (06/11/2023)   PRAPARE - Administrator, Civil Service (Medical): No    Lack of Transportation (Non-Medical): No  Physical Activity: Not on file  Stress: Not on file  Social Connections: Not on file     Review of Systems: A 12 point ROS discussed and pertinent positives are indicated in the HPI above.  All other systems are negative.  Vital Signs: BP 139/68   Pulse 70   Temp (!) 97.4 F (36.3 C)   Resp 15   Wt 148 lb 2.4 oz (67.2 kg)   LMP  (LMP Unknown)   SpO2 98%   BMI 25.43 kg/m     Physical Exam Vitals and nursing note reviewed.  Constitutional:      General: Patient is not in acute distress.    Appearance: Normal appearance. Patient is not ill-appearing.  HENT:     Head: Normocephalic and atraumatic.     Mouth/Throat:     Mouth: Mucous membranes are moist.     Pharynx: Oropharynx is clear.  Cardiovascular:     Rate and Rhythm: Normal rate and regular rhythm.     Pulses: Normal pulses.     Heart sounds: Normal heart sounds.  Pulmonary:     Effort: Pulmonary effort is normal.     Breath sounds: Normal breath sounds.  Abdominal:     General: Abdomen is flat. Bowel sounds are normal.     Palpations: Abdomen is soft.  Musculoskeletal:     Cervical back: Neck supple.  Skin:    General: Skin is warm and dry.     Coloration: Skin is not jaundiced or pale.  Neurological:     Mental Status: Patient is alert and oriented to person, place, and time.  Psychiatric:        Mood and Affect: Mood normal.        Behavior: Behavior normal.        Judgment: Judgment normal.    MD Evaluation Airway: WNL Heart: WNL Abdomen: WNL Chest/ Lungs: WNL ASA  Classification: 3 Mallampati/Airway Score: Two  Imaging: DG CHEST PORT 1 VIEW  Result Date: 06/18/2023 CLINICAL DATA:  Shortness of breath. EXAM: PORTABLE CHEST 1 VIEW COMPARISON:  Chest x-ray dated June 11, 2023. FINDINGS: Unchanged borderline cardiomegaly. New small bilateral pleural effusions with bibasilar atelectasis. No pneumothorax. No acute osseous abnormality. IMPRESSION: 1. New small bilateral pleural effusions with bibasilar atelectasis. Electronically Signed   By: Obie Dredge M.D.   On: 06/18/2023 19:48   PERIPHERAL VASCULAR CATHETERIZATION  Result Date: 06/17/2023   Mid LAD lesion is 80% stenosed.   Mid Cx to Dist Cx lesion is 90% stenosed.   Ramus lesion is 60% stenosed.   RPDA lesion is 90% stenosed.   Prox RCA lesion  is 80% stenosed.   Prox RCA to Mid RCA lesion is 99% stenosed.   Mid RCA lesion  is 70% stenosed.   RPAV lesion is 60% stenosed.   A drug-eluting stent was successfully placed using a SYNERGY XD 3.0X48.   A drug-eluting stent was successfully placed using a SYNERGY XD 3.50X16.   Post intervention, there is a 0% residual stenosis.   Post intervention, there is a 0% residual stenosis.   Post intervention, there is a 0% residual stenosis. Moderately to severely elevated left ventricular end-diastolic pressure at 32 mmHg. 2. Successful complex intravascular lithotripsy and 2 overlapped drug-eluting stent placement to the mid and proximal right coronary artery.  Very difficult procedure overall due to heavy calcifications and difficulty crossing the stenosis in the mid right coronary artery.  The distal RCA bifurcation disease appeared better than before likely due to improved upstream flow and thus I elected not to treat. 3.  The patient did go into polymorphic ventricular tachycardia that lasted for about 10 seconds before starting the procedure.  She converted and did not require defibrillation. 4.  Difficulty sedating the patient with continuous involuntary movements of her lower extremities throughout the case. Recommendations: Dual antiplatelet therapy for at least 12 months. I discontinued heparin drip. I switch amiodarone drip to oral. Recommend treating the LAD disease medically.   CARDIAC CATHETERIZATION  Result Date: 06/17/2023   Mid LAD lesion is 80% stenosed.   Mid Cx to Dist Cx lesion is 90% stenosed.   Ramus lesion is 60% stenosed.   RPDA lesion is 90% stenosed.   Prox RCA lesion is 80% stenosed.   Prox RCA to Mid RCA lesion is 99% stenosed.   Mid RCA lesion is 70% stenosed.   RPAV lesion is 60% stenosed.   A drug-eluting stent was successfully placed using a SYNERGY XD 3.0X48.   A drug-eluting stent was successfully placed using a SYNERGY XD 3.50X16.   Post intervention, there is a 0% residual stenosis.   Post intervention, there is a 0% residual stenosis.   Post intervention,  there is a 0% residual stenosis. Moderately to severely elevated left ventricular end-diastolic pressure at 32 mmHg. 2. Successful complex intravascular lithotripsy and 2 overlapped drug-eluting stent placement to the mid and proximal right coronary artery.  Very difficult procedure overall due to heavy calcifications and difficulty crossing the stenosis in the mid right coronary artery.  The distal RCA bifurcation disease appeared better than before likely due to improved upstream flow and thus I elected not to treat. 3.  The patient did go into polymorphic ventricular tachycardia that lasted for about 10 seconds before starting the procedure.  She converted and did not require defibrillation. 4.  Difficulty sedating the patient with continuous involuntary movements of her lower extremities throughout the case. Recommendations: Dual antiplatelet therapy for at least 12 months. I discontinued heparin drip. I switch amiodarone drip to oral. Recommend treating the LAD disease medically.   CARDIAC CATHETERIZATION  Result Date: 06/15/2023   Mid LAD lesion is 80% stenosed.   Mid Cx to Dist Cx lesion is 90% stenosed.   Ramus lesion is 60% stenosed.   Prox RCA to Mid RCA lesion is 95% stenosed.   Mid RCA to Dist RCA lesion is 60% stenosed.   RPDA lesion is 90% stenosed.   RPAV lesion is 80% stenosed.   There is moderate to severe left ventricular systolic dysfunction.   LV end diastolic pressure is mildly elevated.   The left ventricular ejection  fraction is 25-35% by visual estimate. 1.  Severe three-vessel coronary artery disease.  The culprit for myocardial infarction is likely subtotal occlusion of the right coronary artery which is heavily calcified and diffusely diseased in the proximal/mid segment.  In addition, there is complex bifurcation disease at the right PDA and right posterior AV groove artery. 2.  Mildly with severely reduced LV systolic function with an EF of 30% with severe inferior and apical  hypokinesis. 3.  Right heart catheterization showed mildly elevated filling pressures, mild pulmonary hypertension and normal cardiac output.  No significant V waves noted on wedge tracing. Recommendations: Difficult revascularization options.  The patient has surgical disease but she might not be a candidate for CABG.  The RCA is the culprit but it is diffusely diseased and heavily calcified.  Will discuss with patient and family and consider transfer to [facility] for evaluation.  If we decide to PCI, it is preferable to transfuse her to get her hemoglobin above 9 and load with an antiplatelet medication.   PERIPHERAL VASCULAR CATHETERIZATION  Result Date: 06/12/2023 See surgical note for result.  DG Chest 2 View  Result Date: 06/11/2023 CLINICAL DATA:  SOB, eval edema, new onset ESRD EXAM: CHEST - 2 VIEW COMPARISON:  June 04, 2023 FINDINGS: The cardiomediastinal silhouette is unchanged in contour.Mild bronchial cuffing with interstitial prominence. No pleural effusion. No pneumothorax. Visualized abdomen is unremarkable. Multilevel degenerative changes of the thoracic spine. IMPRESSION: Constellation of findings are favored to reflect mild underlying pulmonary edema. Electronically Signed   By: Meda Klinefelter M.D.   On: 06/11/2023 16:22   NM Myocar Multi W/Spect W/Wall Motion / EF  Result Date: 06/05/2023 Pharmacological myocardial perfusion imaging study with no significant  ischemia Large fixed defect in the inferior and inferolateral wall consistent with prior MI Hypokinesis of the inferior and inferolateral wall, EF estimated at 31% EKG changes concerning for ischemia at peak stress/recovery (diffuse ST elevations in the inferior and anterolateral leads V3 through V6. CT attenuation correction images with mild diffuse aortic atherosclerosis, coronary calcification, predominantly in the RCA High risk scan in the setting of large infarct Signed, Dossie Arbour, MD, Ph.D [facility]    US RENAL  Result Date: 06/05/2023 CLINICAL DATA:  Acute renal injury EXAM: RENAL / URINARY TRACT ULTRASOUND COMPLETE COMPARISON:  None Available. FINDINGS: Right Kidney: Length = 7.7 cm Renal cortical thinning. Diffusely increased cortical echogenicity with preserved corticomedullary differentiation which can be seen with medical renal disease. No urinary tract dilation or shadowing calculi. The ureter is not seen. Left Kidney: Length = 8.3 cm Renal cortical thinning. Diffusely increased cortical echogenicity with diminished corticomedullary differentiation which can be seen with medical renal disease. Interpolar simple-appearing cyst measures 10 mm. No urinary tract dilation or shadowing calculi. The ureter is not seen. Bladder: Appears normal for degree of bladder distention. Other: None. IMPRESSION: 1. No urinary tract dilation or shadowing calculi. 2. Bilateral renal cortical thinning and increased cortical echogenicity which can be seen with medical renal disease. Electronically Signed   By: Agustin Cree M.D.   On: 06/05/2023 09:56   ECHOCARDIOGRAM COMPLETE  Result Date: 06/04/2023    ECHOCARDIOGRAM REPORT   Patient Name:   Cassandra Baker Date of Exam: 06/04/2023 Medical Rec #:  [id number]      Height:       64.0 in Accession #:    [id number]     Weight:       148.0 lb Date of Birth:  [birth year]  BSA:          1.721 m Patient Age:    81 years       BP:           124/74 mmHg Patient Gender: F              HR:           97 bpm. Exam Location:  [facility] Procedure: 2D Echo, Cardiac Doppler, Color Doppler and Strain Analysis Indications:     CHF, NSTEMI  History:         Patient has no prior history of Echocardiogram examinations.                  CHF, Acute MI; Risk Factors:Diabetes. CKD.  Sonographer:     Mikki Harbor Referring Phys:  [id number] JAN A MANSY Diagnosing Phys: Julien Nordmann MD  Sonographer Comments: Global longitudinal strain was attempted. IMPRESSIONS  1. Left ventricular ejection fraction,  by estimation, is 30 to 35%. The left ventricle has moderately decreased function. The left ventricle demonstrates mild global hypokinesis with severe hypokinesis of the inferior/posterior/septal wall. There is mild left ventricular hypertrophy. Left ventricular diastolic parameters are consistent with Grade I diastolic dysfunction (impaired relaxation).  2. Right ventricular systolic function is normal. The right ventricular size is normal.  3. The mitral valve is normal in structure. Moderate mitral valve regurgitation. No evidence of mitral stenosis. Moderate mitral annular calcification.  4. The aortic valve is normal in structure. Aortic valve regurgitation is not visualized. No aortic stenosis is present. Aortic valve mean gradient measures 8.0 mmHg.  5. The inferior vena cava is normal in size with greater than 50% respiratory variability, suggesting right atrial pressure of 3 mmHg. FINDINGS  Left Ventricle: Left ventricular ejection fraction, by estimation, is 30 to 35%. The left ventricle has moderately decreased function. The left ventricle demonstrates global hypokinesis. The average left ventricular global longitudinal strain is -4.4 %.  The left ventricular internal cavity size was normal in size. There is mild left ventricular hypertrophy. Left ventricular diastolic parameters are consistent with Grade I diastolic dysfunction (impaired relaxation). Right Ventricle: The right ventricular size is normal. No increase in right ventricular wall thickness. Right ventricular systolic function is normal. Left Atrium: Left atrial size was normal in size. Right Atrium: Right atrial size was normal in size. Pericardium: There is no evidence of pericardial effusion. Mitral Valve: The mitral valve is normal in structure. Moderate mitral annular calcification. Moderate mitral valve regurgitation. No evidence of mitral valve stenosis. MV peak gradient, 5.0 mmHg. The mean mitral valve gradient is 2.0 mmHg. Tricuspid  Valve: The tricuspid valve is normal in structure. Tricuspid valve regurgitation is not demonstrated. No evidence of tricuspid stenosis. Aortic Valve: The aortic valve is normal in structure. Aortic valve regurgitation is not visualized. No aortic stenosis is present. Aortic valve mean gradient measures 8.0 mmHg. Aortic valve peak gradient measures 13.1 mmHg. Aortic valve area, by VTI measures 1.57 cm. Pulmonic Valve: The pulmonic valve was normal in structure. Pulmonic valve regurgitation is not visualized. No evidence of pulmonic stenosis. Aorta: The aortic root is normal in size and structure. Venous: The inferior vena cava is normal in size with greater than 50% respiratory variability, suggesting right atrial pressure of 3 mmHg. IAS/Shunts: The interatrial septum appears to be lipomatous. No atrial level shunt detected by color flow Doppler.  LEFT VENTRICLE PLAX 2D LVIDd:         4.90 cm     Diastology  LVIDs:         4.20 cm     LV e' medial:    5.44 cm/s LV PW:         1.50 cm     LV E/e' medial:  21.1 LV IVS:        1.10 cm     LV e' lateral:   6.96 cm/s LVOT diam:     2.00 cm     LV E/e' lateral: 16.5 LV SV:         63 LV SV Index:   37          2D Longitudinal Strain LVOT Area:     3.14 cm    2D Strain GLS Avg:     -4.4 %  LV Volumes (MOD) LV vol d, MOD A2C: 85.3 ml LV vol d, MOD A4C: 73.8 ml LV vol s, MOD A2C: 48.3 ml LV vol s, MOD A4C: 37.4 ml LV SV MOD A2C:     37.0 ml LV SV MOD A4C:     73.8 ml LV SV MOD BP:      38.6 ml RIGHT VENTRICLE RV Basal diam:  3.25 cm RV Mid diam:    2.50 cm RV S prime:     13.10 cm/s TAPSE (M-mode): 2.7 cm LEFT ATRIUM             Index        RIGHT ATRIUM           Index LA diam:        3.90 cm 2.27 cm/m   RA Area:     12.70 cm LA Vol (A2C):   41.4 ml 24.05 ml/m  RA Volume:   31.80 ml  18.47 ml/m LA Vol (A4C):   58.4 ml 33.93 ml/m LA Biplane Vol: 49.8 ml 28.93 ml/m  AORTIC VALVE                     PULMONIC VALVE AV Area (Vmax):    1.69 cm      PV Vmax:       1.17 m/s  AV Area (Vmean):   1.37 cm      PV Peak grad:  5.5 mmHg AV Area (VTI):     1.57 cm AV Vmax:           181.00 cm/s AV Vmean:          139.000 cm/s AV VTI:            0.405 m AV Peak Grad:      13.1 mmHg AV Mean Grad:      8.0 mmHg LVOT Vmax:         97.40 cm/s LVOT Vmean:        60.600 cm/s LVOT VTI:          0.202 m LVOT/AV VTI ratio: 0.50  AORTA Ao Root diam: 2.80 cm MITRAL VALVE MV Area (PHT): 5.16 cm       SHUNTS MV Area VTI:   2.11 cm       Systemic VTI:  0.20 m MV Peak grad:  5.0 mmHg       Systemic Diam: 2.00 cm MV Mean grad:  2.0 mmHg MV Vmax:       1.12 m/s MV Vmean:      63.1 cm/s MV Decel Time: 147 msec MR Peak grad:    129.0 mmHg MR Mean grad:    88.0 mmHg MR Vmax:  568.00 cm/s MR Vmean:        441.0 cm/s MR PISA:         1.01 cm MR PISA Eff ROA: 16 mm MR PISA Radius:  0.40 cm MV E velocity: 115.00 cm/s MV A velocity: 125.00 cm/s MV E/A ratio:  0.92 Julien Nordmann MD Electronically signed by Julien Nordmann MD Signature Date/Time: 06/04/2023/4:58:01 PM    Final    DG Chest 1 View  Result Date: 06/04/2023 CLINICAL DATA:  Shortness of breath EXAM: CHEST  1 VIEW COMPARISON:  03/11/2018 FINDINGS: There is hyperinflation of the lungs compatible with COPD. Increased markings in the lung bases. No effusions or pneumothorax. Heart mediastinal contours within normal limits. Aortic atherosclerosis. IMPRESSION: COPD. Increased markings in the lung bases could reflect atelectasis or scarring. Electronically Signed   By: Charlett Nose M.D.   On: 06/04/2023 00:34    Labs:  CBC: Recent Labs    06/16/23 1700 06/17/23 0536 06/18/23 0556 06/19/23 0527  WBC 10.6* 9.9 7.2 8.5  HGB 8.1* 9.1* 7.6* 9.2*  HCT 25.5* 27.9* 23.2* 28.9*  PLT 183 137* 113* 121*    COAGS: Recent Labs    06/04/23 0145 06/12/23 0413  INR 1.2 1.3*  APTT 35  --     BMP: Recent Labs    06/16/23 0421 06/16/23 1700 06/17/23 0536 06/18/23 0556  NA 126* 122* 130* 124*  128*  K 4.4 4.6 4.0 3.8  3.9  CL 91* 86*  89* 89*  88*  CO2 25 23 25 24  23   GLUCOSE 120* 146* 123* 88  91  BUN 59* 58* 26* 32*  34*  CALCIUM 9.0 9.1 9.0 8.7*  9.0  CREATININE 4.38* 4.71* 2.94* 3.67*  3.69*  GFRNONAA 10* 9* 15* 12*  12*    LIVER FUNCTION TESTS: Recent Labs    06/11/23 1322 06/13/23 0433 06/18/23 0556  BILITOT 0.6  --   --   AST 15  --   --   ALT 28  --   --   ALKPHOS 106  --   --   PROT 6.6  --   --   ALBUMIN 3.7 3.4* 2.5*    TUMOR MARKERS: No results for input(s): "AFPTM", "CEA", "CA199", "CHROMGRNA" in the last 8760 hours.  Assessment and Plan: 82 y.o. female with new ESRD who is in need of long term HD access.   VSS CBC stable  Pt not NPO, has lunch around noon today.  Allergies reviewed  On ASA 81 mg, Plavix 75 mg, and sq heparin q8r, no need for d/c.   Risks and benefits discussed with the patient including, but not limited to bleeding, infection, vascular injury, pneumothorax which may require chest tube placement, air embolism or even death  All of the patient's questions were answered, patient is agreeable to proceed. Consent signed and in chart.  The procedure is tentatively scheduled for Monday pending IR schedule, RN/ MD notified.   PLAN - NPO Monday MN - allergic to penicillin, 1 g vanc  to be given in IR   Thank you for this interesting consult.  I greatly enjoyed meeting DARRAH KAMIENSKI and look forward to participating in their care.  A copy of this report was sent to the requesting provider on this date.  Electronically Signed: Willette Brace, PA-C 06/19/2023, 1:59 PM   I spent a total of 20 Minutes    in face to face in clinical consultation, greater than 50% of which was counseling/coordinating care  for tunneled HD cath placement.   This chart was dictated using voice recognition software.  Despite best efforts to proofread,  errors can occur which can change the documentation meaning.

## 2023-06-19 NOTE — Progress Notes (Signed)
Rounding Note    Patient Name: Cassandra Baker Date of Encounter: 06/19/2023  [facility] Cardiologist: Julien Nordmann, MD   Subjective   Feeling better this morning.  No chest pain or dyspnea.  Inpatient Medications    Scheduled Meds:  sodium chloride   Intravenous Once   sodium chloride   Intravenous Once   amiodarone  200 mg Oral BID   aspirin EC  81 mg Oral Daily   atorvastatin  80 mg Oral Daily   calcium acetate  667 mg Oral TID WC   Chlorhexidine Gluconate Cloth  6 each Topical Q0600   clopidogrel  75 mg Oral Daily   darbepoetin (ARANESP) injection - DIALYSIS  60 mcg Subcutaneous Q Thu-1800   FLUoxetine  10 mg Oral Once per day on Mon Wed Fri   furosemide  80 mg Oral BID   heparin  5,000 Units Subcutaneous Q8H   insulin aspart  0-15 Units Subcutaneous TID WC   levothyroxine  75 mcg Oral QAC breakfast   metoprolol succinate  25 mg Oral Daily   sodium bicarbonate  650 mg Oral BID   sodium chloride flush  3 mL Intravenous Q12H   sodium chloride flush  3 mL Intravenous Q12H   sodium chloride flush  3 mL Intravenous Q12H   Continuous Infusions:  sodium chloride     sodium chloride     albumin human     anticoagulant sodium citrate     PRN Meds: sodium chloride, sodium chloride, acetaminophen, albumin human, alteplase, anticoagulant sodium citrate, heparin, methocarbamol, nitroGLYCERIN, ondansetron (ZOFRAN) IV, mouth rinse, sodium chloride flush, sodium chloride flush   Vital Signs    Vitals:   06/19/23 0800 06/19/23 0815 06/19/23 0830 06/19/23 0845  BP: 139/68     Pulse: 73 73 69 70  Resp: 16 (!) 21 19 15   Temp:  (!) 97.4 F (36.3 C)    TempSrc:      SpO2: 94% 98% 96% 98%  Weight:        Intake/Output Summary (Last 24 hours) at 06/19/2023 1007 Last data filed at 06/19/2023 0700 Gross per 24 hour  Intake 1295 ml  Output 2035 ml  Net -740 ml      06/19/2023    5:00 AM 06/18/2023   11:27 AM 06/18/2023    7:25 AM  Last 3 Weights  Weight  (lbs) 148 lb 2.4 oz 145 lb 8.1 oz 159 lb 9.8 oz  Weight (kg) 67.2 kg 66 kg 72.4 kg      Telemetry    Sinus rhythm with occasional PVCs, no sustained arrhythmia - Personally Reviewed   Physical Exam  Alert, oriented, elderly woman in no distress, up in a chair this morning. GEN: No acute distress.   Neck: No JVD Cardiac: RRR, no murmurs, rubs, or gallops.  Respiratory: Clear to auscultation bilaterally. GI: Soft, nontender, non-distended  MS: No edema; No deformity. Neuro:  Nonfocal  Psych: Normal affect   Labs    High Sensitivity Troponin:   Recent Labs  Lab 06/03/23 2353 06/04/23 0145 06/05/23 1421 06/15/23 2237 06/16/23 0013  TROPONINIHS 986* 998* 993* 180* 209*     Chemistry Recent Labs  Lab 06/13/23 0433 06/15/23 0413 06/16/23 0420 06/16/23 0421 06/16/23 1700 06/17/23 0536 06/18/23 0556  NA 133*   < >  --    < > 122* 130* 124*  128*  K 4.8   < >  --    < > 4.6 4.0 3.8  3.9  CL 96*   < >  --    < > 86* 89* 89*  88*  CO2 24   < >  --    < > 23 25 24  23   GLUCOSE 132*   < >  --    < > 146* 123* 88  91  BUN 70*   < >  --    < > 58* 26* 32*  34*  CREATININE 4.68*   < >  --    < > 4.71* 2.94* 3.67*  3.69*  CALCIUM 9.4   < >  --    < > 9.1 9.0 8.7*  9.0  MG 2.5*  --  2.2  --   --   --   --   ALBUMIN 3.4*  --   --   --   --   --  2.5*  GFRNONAA 9*   < >  --    < > 9* 15* 12*  12*  ANIONGAP 13   < >  --    < > 13 16* 11  17*   < > = values in this interval not displayed.    Lipids No results for input(s): "CHOL", "TRIG", "HDL", "LABVLDL", "LDLCALC", "CHOLHDL" in the last 168 hours.  Hematology Recent Labs  Lab 06/17/23 0536 06/18/23 0556 06/19/23 0527  WBC 9.9 7.2 8.5  RBC 2.94* 2.41* 3.14*  HGB 9.1* 7.6* 9.2*  HCT 27.9* 23.2* 28.9*  MCV 94.9 96.3 92.0  MCH 31.0 31.5 29.3  MCHC 32.6 32.8 31.8  RDW 13.7 14.1 16.0*  PLT 137* 113* 121*   Thyroid No results for input(s): "TSH", "FREET4" in the last 168 hours.  BNPNo results for input(s):  "BNP", "PROBNP" in the last 168 hours.  DDimer No results for input(s): "DDIMER" in the last 168 hours.   Radiology    DG CHEST PORT 1 VIEW  Result Date: 06/18/2023 CLINICAL DATA:  Shortness of breath. EXAM: PORTABLE CHEST 1 VIEW COMPARISON:  Chest x-ray dated June 11, 2023. FINDINGS: Unchanged borderline cardiomegaly. New small bilateral pleural effusions with bibasilar atelectasis. No pneumothorax. No acute osseous abnormality. IMPRESSION: 1. New small bilateral pleural effusions with bibasilar atelectasis. Electronically Signed   By: Obie Dredge M.D.   On: 06/18/2023 19:48   PERIPHERAL VASCULAR CATHETERIZATION  Result Date: 06/17/2023   Mid LAD lesion is 80% stenosed.   Mid Cx to Dist Cx lesion is 90% stenosed.   Ramus lesion is 60% stenosed.   RPDA lesion is 90% stenosed.   Prox RCA lesion is 80% stenosed.   Prox RCA to Mid RCA lesion is 99% stenosed.   Mid RCA lesion is 70% stenosed.   RPAV lesion is 60% stenosed.   A drug-eluting stent was successfully placed using a SYNERGY XD 3.0X48.   A drug-eluting stent was successfully placed using a SYNERGY XD 3.50X16.   Post intervention, there is a 0% residual stenosis.   Post intervention, there is a 0% residual stenosis.   Post intervention, there is a 0% residual stenosis. Moderately to severely elevated left ventricular end-diastolic pressure at 32 mmHg. 2. Successful complex intravascular lithotripsy and 2 overlapped drug-eluting stent placement to the mid and proximal right coronary artery.  Very difficult procedure overall due to heavy calcifications and difficulty crossing the stenosis in the mid right coronary artery.  The distal RCA bifurcation disease appeared better than before likely due to improved upstream flow and thus I elected not to treat. 3.  The patient  did go into polymorphic ventricular tachycardia that lasted for about 10 seconds before starting the procedure.  She converted and did not require defibrillation. 4.  Difficulty  sedating the patient with continuous involuntary movements of her lower extremities throughout the case. Recommendations: Dual antiplatelet therapy for at least 12 months. I discontinued heparin drip. I switch amiodarone drip to oral. Recommend treating the LAD disease medically.   CARDIAC CATHETERIZATION  Result Date: 06/17/2023   Mid LAD lesion is 80% stenosed.   Mid Cx to Dist Cx lesion is 90% stenosed.   Ramus lesion is 60% stenosed.   RPDA lesion is 90% stenosed.   Prox RCA lesion is 80% stenosed.   Prox RCA to Mid RCA lesion is 99% stenosed.   Mid RCA lesion is 70% stenosed.   RPAV lesion is 60% stenosed.   A drug-eluting stent was successfully placed using a SYNERGY XD 3.0X48.   A drug-eluting stent was successfully placed using a SYNERGY XD 3.50X16.   Post intervention, there is a 0% residual stenosis.   Post intervention, there is a 0% residual stenosis.   Post intervention, there is a 0% residual stenosis. Moderately to severely elevated left ventricular end-diastolic pressure at 32 mmHg. 2. Successful complex intravascular lithotripsy and 2 overlapped drug-eluting stent placement to the mid and proximal right coronary artery.  Very difficult procedure overall due to heavy calcifications and difficulty crossing the stenosis in the mid right coronary artery.  The distal RCA bifurcation disease appeared better than before likely due to improved upstream flow and thus I elected not to treat. 3.  The patient did go into polymorphic ventricular tachycardia that lasted for about 10 seconds before starting the procedure.  She converted and did not require defibrillation. 4.  Difficulty sedating the patient with continuous involuntary movements of her lower extremities throughout the case. Recommendations: Dual antiplatelet therapy for at least 12 months. I discontinued heparin drip. I switch amiodarone drip to oral. Recommend treating the LAD disease medically.     Patient Profile     82 y.o. 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  Rhythm is stable on oral amiodarone.  Continue at current dose.  Continue beta-blockade. 2.  Non-STEMI: Patient status post PCI of the RCA with plans for medical therapy for her residual CAD.  Continue aspirin and clopidogrel as well as a high intensity statin drug and beta-blocker.  No anginal symptoms. 3.  Heart failure with reduced ejection fraction: Continue beta-blockade, volume removal with dialysis.  Blood pressure will not allow for escalation of her GDMT. 4.  End-stage renal disease: Appreciate nephrology care.  Patient has been dialyzed from the right femoral line.  Consult placed to IR for a dialysis catheter.  Once she has good dialysis access, she will be eligible for hospital discharge.  Plans for dialysis tomorrow. 5.  Anemia, acute on chronic: Hemoglobin improved to 9.2 today from 7.6 yesterday after 1 unit of packed red blood cells administered during dialysis.  Disposition: Transfer out of ICU to a telemetry bed today.  Dialysis catheter placement per nephrology/IR.  Otherwise as above.  For questions or updates, please contact [facility] Please consult [email or web address] for contact info under  Signed, Tonny Bollman, MD  06/19/2023, 10:07 AM

## 2023-06-19 NOTE — Progress Notes (Signed)
CARDIAC REHAB PHASE I   Pt still with HD fem cath. Discussed with pt MI, stents, DAPT,  and CRPII. Pt receptive. Will f/u when she can mobilize more. Will refer to [facility] however program will be difficult given new MWF HD schedule.  [id number]  Ethelda Chick BS, ACSM-CEP 06/19/2023 11:54 AM

## 2023-06-19 NOTE — Progress Notes (Signed)
Initial Nutrition Assessment  DOCUMENTATION CODES:   Not applicable  INTERVENTION:   If po intake inadequate on follow-up, recommend liberalizing diet   Add Renal MVI daily  Nepro Shake po BID, each supplement provides 425 kcal and 19 grams protein  NUTRITION DIAGNOSIS:   Increased nutrient needs related to acute illness as evidenced by estimated needs.  GOAL:   Patient will meet greater than or equal to 90% of their needs  MONITOR:   PO intake, Supplement acceptance, Labs, Weight trends  REASON FOR ASSESSMENT:   Consult Assessment of nutrition requirement/status  ASSESSMENT:   82 yo female admitted to [facility] on 06/11/23 with progression of CKD to ESRD requiring initiation of iHD, acute CHF, polymorphic VT-cath revealing severe 3V CAD and pt transferred to [facility] for further evaluation. Noted pt with admission from 6/13-6/16 with plans for initiation of iHD but pt left AMA. PMH includes CHF, CKD, depression, DM, HTN  6/21 1st iHD 6/22 Polymorphic VT 6/24 2nd iHD, transferred to [facility] 6/25 3rd iHD 6/27 4th iHD  Recorded po intake 50-100% of meals yesterday, no other intake recorded  Pt to have tunneled iHD cath placed. Noted pt had AV fistula creation in May 2020 but found to have occlusion in May 2024 but declined intervention as not requiring iHD. Per Nephrology, pt appeared to have been dealing with uremic symptoms for a while prior to starting iHD, including myoclonic jerking  Phosphorus trend improving with iHD, also on Phoslo. Noted pt was prescribed phoslo and calcitrol as outpatient  Current wt 67.2 kg; weight yesterday 66 kg post iHD (pre iHD wt of 72.4 kg). Unsure of dry weight at this time. Nonpitting edema on exam  Labs: sodium 128 (L), Creatinine 3.7, BUN 34, CBGs 78-192, phosphorus 6.2 Meds: ss novolog, lasix, aranesp, phoslo  NUTRITION - FOCUSED PHYSICAL EXAM:  Assess on follow-up  Diet Order:   Diet Order             Diet NPO time specified Except for:  Sips with Meds  Diet effective midnight           Diet renal with fluid restriction Fluid restriction: 1200 mL Fluid; Room service appropriate? Yes; Fluid consistency: Thin  Diet effective now                   EDUCATION NEEDS:   Education needs have been addressed  Skin:  Skin Assessment: Reviewed RN Assessment  Last BM:  6/25  Height:   Ht Readings from Last 1 Encounters:  06/11/23 5\' 4"  (1.626 m)    Weight:   Wt Readings from Last 1 Encounters:  06/19/23 67.2 kg    BMI:  Body mass index is 25.43 kg/m.  Estimated Nutritional Needs:   Kcal:  1650-1850 kcals  Protein:  80-100 g  Fluid:  1000 mL plus UOP   Romelle Starcher MS, RDN, LDN, CNSC Registered Dietitian 3 Clinical Nutrition RD Pager and On-Call Pager Number Located in [place]

## 2023-06-19 NOTE — TOC Initial Note (Addendum)
Transition of Care [facility]) - Initial/Assessment Note    Patient Details  Name: Cassandra Baker MRN: [id number] Date of Birth: Apr 28, 1941  Transition of Care [facility]) CM/SW Contact:    Leone Haven, RN Phone Number: 06/19/2023, 4:20 PM  Clinical Narrative:                 From home alone, indep, family lives 40 minutes away and her SIL lives 10 minutes away.  She has PCP, Dr. Nehemiah Settle, she has insurance on file.  She currently does not have any HH services in place. She has a cane and a walker at home.  Her son Onalee Hua will most likely transport her home at dc.  She has good family support, they are always in and out checking on her. She gets her medications from [facility] on [address] in [place].  Expected Discharge Plan: Home w Home Health Services Barriers to Discharge: Continued Medical Work up   Patient Goals and CMS Choice Patient states their goals for this hospitalization and ongoing recovery are:: return home   Choice offered to / list presented to : NA      Expected Discharge Plan and Services   Discharge Planning Services: CM Consult Post Acute Care Choice: NA Living arrangements for the past 2 months: Single Family Home                 DME Arranged: N/A DME Agency: NA       HH Arranged: NA          Prior Living Arrangements/Services Living arrangements for the past 2 months: Single Family Home Lives with:: Self Patient language and need for interpreter reviewed:: Yes Do you feel safe going back to the place where you live?: Yes      Need for Family Participation in Patient Care: Yes (Comment) Care giver support system in place?: Yes (comment) Current home services: DME (cane, walker) Criminal Activity/Legal Involvement Pertinent to Current Situation/Hospitalization: No - Comment as needed  Activities of Daily Living      Permission Sought/Granted Permission sought to share information with : Case Manager                Emotional  Assessment Appearance:: Appears stated age Attitude/Demeanor/Rapport: Engaged Affect (typically observed): Appropriate Orientation: : Oriented to Self, Oriented to Place, Oriented to  Time, Oriented to Situation Alcohol / Substance Use: Not Applicable Psych Involvement: No (comment)  Admission diagnosis:  Ventricular tachycardia, sustained (HCC) [I47.20] Patient Active Problem List   Diagnosis Date Noted   Ventricular tachycardia, sustained (HCC) 06/15/2023   Polymorphic ventricular tachycardia (HCC) 06/14/2023   E. coli UTI 06/13/2023   ESRD (end stage renal disease) (HCC) 06/11/2023   CAD (coronary artery disease) 06/11/2023   Essential hypertension 06/11/2023   Gout 06/11/2023   Pure hypercholesterolemia 06/11/2023   Pyuria 06/11/2023   Confusion 06/05/2023   Non-ST elevation (NSTEMI) myocardial infarction (HCC) 06/04/2023   Acute on chronic combined systolic and diastolic CHF (congestive heart failure) (HCC) 06/04/2023   Acute kidney injury superimposed on chronic kidney disease (HCC) 06/04/2023   Anxiety and depression 06/04/2023   Hypothyroidism 06/04/2023   Type 2 diabetes mellitus (HCC) 06/04/2023   Acute systolic CHF (congestive heart failure) (HCC) 06/04/2023   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   Chronic kidney disease (CKD), stage IV (severe) (HCC) 10/25/2019   Lymphadenopathy, inguinal 02/09/2018   Endometrial cancer (HCC) 01/18/2018   Hyperparathyroidism, primary (HCC) 12/07/2014   Neoplasm of uncertain behavior of thyroid gland,  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   Primary hyperparathyroidism (HCC) 12/07/2014   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   PCP:  Renford Dills, MD Pharmacy:   [facility] [address], [place] - [address] [place] [address] Phone: [phone] Fax: [phone]     Social Determinants of Health (SDOH) Social History: SDOH Screenings   Food Insecurity: No Food Insecurity (06/11/2023)  Housing: Patient  Declined (06/11/2023)  Transportation Needs: No Transportation Needs (06/11/2023)  Utilities: Not At Risk (06/11/2023)  Tobacco Use: Low Risk  (06/19/2023)   SDOH Interventions:     Readmission Risk Interventions    06/12/2023   10:02 AM 06/06/2023   12:08 PM  Readmission Risk Prevention Plan  Transportation Screening Complete Complete  PCP or Specialist Appt within 5-7 Days  Complete  PCP or Specialist Appt within 3-5 Days Complete   Home Care Screening  Complete  Medication Review (RN CM)  Referral to Pharmacy  HRI or Home Care Consult Complete   Social Work Consult for Recovery Care Planning/Counseling Complete   Palliative Care Screening Not Applicable   Medication Review Oceanographer) Complete

## 2023-06-20 DIAGNOSIS — I472 Ventricular tachycardia, unspecified: Secondary | ICD-10-CM | POA: Diagnosis not present

## 2023-06-20 LAB — GLUCOSE, CAPILLARY
Glucose-Capillary: 103 mg/dL — ABNORMAL HIGH (ref 70–99)
Glucose-Capillary: 106 mg/dL — ABNORMAL HIGH (ref 70–99)

## 2023-06-20 LAB — RENAL FUNCTION PANEL
Albumin: 2.9 g/dL — ABNORMAL LOW (ref 3.5–5.0)
Anion gap: 14 (ref 5–15)
BUN: 33 mg/dL — ABNORMAL HIGH (ref 8–23)
CO2: 25 mmol/L (ref 22–32)
Calcium: 9.7 mg/dL (ref 8.9–10.3)
Chloride: 88 mmol/L — ABNORMAL LOW (ref 98–111)
Creatinine, Ser: 4.07 mg/dL — ABNORMAL HIGH (ref 0.44–1.00)
GFR, Estimated: 10 mL/min — ABNORMAL LOW (ref >60)
Glucose, Bld: 105 mg/dL — ABNORMAL HIGH (ref 70–99)
Phosphorus: 4.3 mg/dL (ref 2.5–4.6)
Potassium: 3.8 mmol/L (ref 3.5–5.1)
Sodium: 127 mmol/L — ABNORMAL LOW (ref 135–145)

## 2023-06-20 LAB — CBC
HCT: 29 % — ABNORMAL LOW (ref 36.0–46.0)
Hemoglobin: 9.4 g/dL — ABNORMAL LOW (ref 12.0–15.0)
MCH: 30.4 pg (ref 26.0–34.0)
MCHC: 32.4 g/dL (ref 30.0–36.0)
MCV: 93.9 fL (ref 80.0–100.0)
Platelets: 127 10*3/uL — ABNORMAL LOW (ref 150–400)
RBC: 3.09 MIL/uL — ABNORMAL LOW (ref 3.87–5.11)
RDW: 15.7 % — ABNORMAL HIGH (ref 11.5–15.5)
WBC: 8.4 10*3/uL (ref 4.0–10.5)
nRBC: 0 % (ref 0.0–0.2)

## 2023-06-20 MED ORDER — OXYCODONE HCL 5 MG PO TABS
5.0000 mg | ORAL_TABLET | Freq: Four times a day (QID) | ORAL | Status: DC | PRN
  Administered 2023-06-20 – 2023-06-21 (×3): 5 mg via ORAL
  Filled 2023-06-20 (×3): qty 1

## 2023-06-20 MED ORDER — HEPARIN SODIUM (PORCINE) 1000 UNIT/ML IJ SOLN
INTRAMUSCULAR | Status: AC
  Administered 2023-06-20: 1000 [IU]
  Filled 2023-06-20: qty 4

## 2023-06-20 MED ORDER — SODIUM CHLORIDE 0.9 % IV SOLN
125.0000 mg | INTRAVENOUS | Status: DC
  Administered 2023-06-20 – 2023-06-27 (×4): 125 mg via INTRAVENOUS
  Filled 2023-06-20 (×6): qty 10

## 2023-06-20 MED ORDER — HEPARIN SODIUM (PORCINE) 1000 UNIT/ML IJ SOLN
INTRAMUSCULAR | Status: AC
  Filled 2023-06-20: qty 3

## 2023-06-20 NOTE — Progress Notes (Signed)
Progress Note  Patient Name: Cassandra Baker Date of Encounter: 06/20/2023  Primary Cardiologist:   Julien Nordmann, MD   Subjective   The patient denies chest pain.  No SOB.   Inpatient Medications    Scheduled Meds:  sodium chloride   Intravenous Once   sodium chloride   Intravenous Once   amiodarone  200 mg Oral BID   aspirin EC  81 mg Oral Daily   atorvastatin  80 mg Oral Daily   calcium acetate  667 mg Oral TID WC   Chlorhexidine Gluconate Cloth  6 each Topical Q0600   Chlorhexidine Gluconate Cloth  6 each Topical Q0600   clopidogrel  75 mg Oral Daily   darbepoetin (ARANESP) injection - DIALYSIS  60 mcg Subcutaneous Q Thu-1800   feeding supplement (NEPRO CARB STEADY)  237 mL Oral BID BM   FLUoxetine  10 mg Oral Once per day on Mon Wed Fri   furosemide  80 mg Oral BID   heparin  5,000 Units Subcutaneous Q8H   heparin sodium (porcine)       insulin aspart  0-15 Units Subcutaneous TID WC   levothyroxine  75 mcg Oral QAC breakfast   metoprolol succinate  25 mg Oral Daily   multivitamin  1 tablet Oral QHS   sodium bicarbonate  650 mg Oral BID   sodium chloride flush  3 mL Intravenous Q12H   Continuous Infusions:  sodium chloride     sodium chloride     albumin human     anticoagulant sodium citrate     [START ON 06/22/2023]  ceFAZolin (ANCEF) IV     ferric gluconate (FERRLECIT) IVPB 125 mg (06/20/23 1123)   PRN Meds: sodium chloride, sodium chloride, acetaminophen, albumin human, alteplase, anticoagulant sodium citrate, heparin, heparin, heparin sodium (porcine), methocarbamol, nitroGLYCERIN, ondansetron (ZOFRAN) IV, mouth rinse, oxyCODONE, sodium chloride flush   Vital Signs    Vitals:   06/20/23 1000 06/20/23 1033 06/20/23 1100 06/20/23 1130  BP: (!) 139/57 135/72 (!) 115/52 (!) 121/55  Pulse: 73 76 63 64  Resp: 16 17 17 18   Temp:      TempSrc:      SpO2: 98% 98% 97% 97%  Weight:      Height:        Intake/Output Summary (Last 24 hours) at 06/20/2023  1145 Last data filed at 06/20/2023 0745 Gross per 24 hour  Intake 340 ml  Output --  Net 340 ml   Filed Weights   06/19/23 1625 06/20/23 0500 06/20/23 0838  Weight: 71.6 kg 71.6 kg 71.4 kg    Telemetry    NSR with few PVCs - Personally Reviewed  ECG    N - Personally Reviewed  Physical Exam   GEN: No acute distress.   Neck: No  JVD Cardiac: RRR, soft systolic murmur at the apex, no diastolic murmurs, rubs, or gallops.  Respiratory: Clear  to auscultation bilaterally. GI: Soft, nontender, non-distended  MS: No  edema; No deformity. Neuro:  Nonfocal  Psych: Normal affect   Labs    Chemistry Recent Labs  Lab 06/17/23 0536 06/18/23 0556 06/20/23 0839  NA 130* 124*  128* 127*  K 4.0 3.8  3.9 3.8  CL 89* 89*  88* 88*  CO2 25 24  23 25   GLUCOSE 123* 88  91 105*  BUN 26* 32*  34* 33*  CREATININE 2.94* 3.67*  3.69* 4.07*  CALCIUM 9.0 8.7*  9.0 9.7  ALBUMIN  --  2.5*  2.9*  GFRNONAA 15* 12*  12* 10*  ANIONGAP 16* 11  17* 14     Hematology Recent Labs  Lab 06/18/23 0556 06/19/23 0527 06/20/23 0047  WBC 7.2 8.5 8.4  RBC 2.41* 3.14* 3.09*  HGB 7.6* 9.2* 9.4*  HCT 23.2* 28.9* 29.0*  MCV 96.3 92.0 93.9  MCH 31.5 29.3 30.4  MCHC 32.8 31.8 32.4  RDW 14.1 16.0* 15.7*  PLT 113* 121* 127*    Cardiac EnzymesNo results for input(s): "TROPONINI" in the last 168 hours. No results for input(s): "TROPIPOC" in the last 168 hours.   BNPNo results for input(s): "BNP", "PROBNP" in the last 168 hours.   DDimer No results for input(s): "DDIMER" in the last 168 hours.   Radiology    VAS [place] UPPER EXT VEIN MAPPING (PRE-OP AVF)  Result Date: 06/19/2023 UPPER EXTREMITY VEIN MAPPING Patient Name:  KENNADEE KULIKOWSKI  Date of Exam:   06/19/2023 Medical Rec #: [id number]       Accession #:    [id number] Date of Birth: 13-Mar-1941       Patient Gender: F Patient Age:   8 years Exam Location:  [facility] Procedure:      VAS [place] UPPER EXT VEIN MAPPING (PRE-OP AVF)  Referring Phys: [facility] --------------------------------------------------------------------------------  Indications: Pre-access. History: History of failed left brachio-cephalic AVF.  Comparison Study: No recent prior. Vein mapping performed 2020 for prior access. Performing Technologist: Jean Rosenthal RDMS, RVT  Examination Guidelines: A complete evaluation includes B-mode imaging, spectral Doppler, color Doppler, and power Doppler as needed of all accessible portions of each vessel. Bilateral testing is considered an integral part of a complete examination. Limited examinations for reoccurring indications may be performed as noted. +-----------------+-------------+----------+---------+ Right Cephalic   Diameter (cm)Depth (cm)Findings  +-----------------+-------------+----------+---------+ Shoulder             0.37        0.25             +-----------------+-------------+----------+---------+ Prox upper arm       0.25        0.15   branching +-----------------+-------------+----------+---------+ Mid upper arm        0.19        0.15             +-----------------+-------------+----------+---------+ Dist upper arm       0.22        0.15             +-----------------+-------------+----------+---------+ Antecubital fossa    0.40        0.43             +-----------------+-------------+----------+---------+ Prox forearm         0.36        0.38             +-----------------+-------------+----------+---------+ Mid forearm          0.29        0.27             +-----------------+-------------+----------+---------+ Dist forearm         0.32        0.23             +-----------------+-------------+----------+---------+ Wrist                0.31        0.21             +-----------------+-------------+----------+---------+ +-----------------+-------------+----------+---------+ Right Basilic    Diameter (cm)Depth (cm)Findings   +-----------------+-------------+----------+---------+  Prox upper arm       0.77                         +-----------------+-------------+----------+---------+ Mid upper arm        0.56                         +-----------------+-------------+----------+---------+ Dist upper arm       0.54                         +-----------------+-------------+----------+---------+ Antecubital fossa    0.35               branching +-----------------+-------------+----------+---------+ Prox forearm         0.33                         +-----------------+-------------+----------+---------+ Mid forearm          0.25                         +-----------------+-------------+----------+---------+ Distal forearm       0.22                         +-----------------+-------------+----------+---------+ Wrist                0.15                         +-----------------+-------------+----------+---------+ +--------------+-------------+----------+--------------+ Left Cephalic Diameter (cm)Depth (cm)   Findings    +--------------+-------------+----------+--------------+ Shoulder          0.39        0.79                  +--------------+-------------+----------+--------------+ Prox upper arm    0.49        0.22                  +--------------+-------------+----------+--------------+ Mid upper arm     0.46        0.18   Thrombosed AVF +--------------+-------------+----------+--------------+ +-----------------+-------------+----------+---------+ Left Basilic     Diameter (cm)Depth (cm)Findings  +-----------------+-------------+----------+---------+ Prox upper arm       0.54                         +-----------------+-------------+----------+---------+ Mid upper arm        0.48                         +-----------------+-------------+----------+---------+ Dist upper arm       0.47                         +-----------------+-------------+----------+---------+  Antecubital fossa    0.35               branching +-----------------+-------------+----------+---------+ Prox forearm         0.35                         +-----------------+-------------+----------+---------+ Mid forearm          0.28                         +-----------------+-------------+----------+---------+ Distal forearm  0.30                         +-----------------+-------------+----------+---------+ Wrist                0.29                         +-----------------+-------------+----------+---------+ *See table(s) above for measurements and observations.  Diagnosing physician: Coral Else MD Electronically signed by Coral Else MD on 06/19/2023 at 10:46:04 PM.    Final    DG CHEST PORT 1 VIEW  Result Date: 06/18/2023 CLINICAL DATA:  Shortness of breath. EXAM: PORTABLE CHEST 1 VIEW COMPARISON:  Chest x-ray dated June 11, 2023. FINDINGS: Unchanged borderline cardiomegaly. New small bilateral pleural effusions with bibasilar atelectasis. No pneumothorax. No acute osseous abnormality. IMPRESSION: 1. New small bilateral pleural effusions with bibasilar atelectasis. Electronically Signed   By: Obie Dredge M.D.   On: 06/18/2023 19:48    Cardiac Studies   Diagnostic Dominance: Right  Intervention    Echo:   1. Left ventricular ejection fraction, by estimation, is 30 to 35%. The  left ventricle has moderately decreased function. The left ventricle  demonstrates mild global hypokinesis with severe hypokinesis of the  inferior/posterior/septal wall. There is  mild left ventricular hypertrophy. Left ventricular diastolic parameters  are consistent with Grade I diastolic dysfunction (impaired relaxation).   2. Right ventricular systolic function is normal. The right ventricular  size is normal.   3. The mitral valve is normal in structure. Moderate mitral valve  regurgitation. No evidence of mitral stenosis. Moderate mitral annular  calcification.   4.  The aortic valve is normal in structure. Aortic valve regurgitation is  not visualized. No aortic stenosis is present. Aortic valve mean gradient  measures 8.0 mmHg.   5. The inferior vena cava is normal in size with greater than 50%  respiratory variability, suggesting right atrial pressure of 3 mmHg.   Patient Profile     82 y.o. 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  No further polymorphic VT.    Non-STEMI:   PCI as above.  Denies symptoms.  Continue with risk reduction.   Heart failure with reduced ejection fraction:   Volume management per dialysis.   Her BP has been labile.  We might be able to titrate meds.  HR has been low so no beta blockers.  Will consider ARB prior to discharge.   End-stage renal disease: Per nephrology.  New to dialysis.  To get a dialysis port and permanent access.      Anemia, acute on chronic:  Hgb stable post transfusion.     For questions or updates, please contact [facility] Please consult [email or web address] for contact info under Cardiology/STEMI.   Signed, Rollene Rotunda, MD  06/20/2023, 11:45 AM

## 2023-06-20 NOTE — Progress Notes (Signed)
Pt at HD, will continue to follow.

## 2023-06-20 NOTE — Progress Notes (Addendum)
[facility] Progress Note   Subjective: Seen on HD. Educating patient about hemodialysis. Hoping she will be CLIPPED to [facility] where she will be monitored carefully. Denies CP/SOB.    Objective Vitals:   06/20/23 0746 06/20/23 0817 06/20/23 0838 06/20/23 0900  BP: 122/67 (!) 146/71 (!) 152/73 (!) 153/96  Pulse: (!) 58 62 65 72  Resp: 18 18 14 18   Temp: 97.6 F (36.4 C)  (!) 97.3 F (36.3 C)   TempSrc: Oral  Oral   SpO2: 93%  100% 99%  Weight:   71.4 kg   Height:       Physical Exam General: Older female in NAD Heart: SR on monitor. S1.S2 No M/R/G Lungs: CTAB Abdomen: Soft, NABS Extremities:No LE edema Dialysis Access: R Femoral temp catheter blood lines connected.    Additional Objective Labs: Basic Metabolic Panel: Recent Labs  Lab 06/16/23 1700 06/17/23 0536 06/18/23 0556 06/20/23 0839  NA 122* 130* 124*  128* 127*  K 4.6 4.0 3.8  3.9 3.8  CL 86* 89* 89*  88* 88*  CO2 23 25 24  23 25   GLUCOSE 146* 123* 88  91 105*  BUN 58* 26* 32*  34* 33*  CREATININE 4.71* 2.94* 3.67*  3.69* 4.07*  CALCIUM 9.1 9.0 8.7*  9.0 9.7  PHOS 6.2*  --  5.8* 4.3   Liver Function Tests: Recent Labs  Lab 06/18/23 0556 06/20/23 0839  ALBUMIN 2.5* 2.9*   No results for input(s): "LIPASE", "AMYLASE" in the last 168 hours. CBC: Recent Labs  Lab 06/16/23 1700 06/17/23 0536 06/18/23 0556 06/19/23 0527 06/20/23 0047  WBC 10.6* 9.9 7.2 8.5 8.4  HGB 8.1* 9.1* 7.6* 9.2* 9.4*  HCT 25.5* 27.9* 23.2* 28.9* 29.0*  MCV 94.8 94.9 96.3 92.0 93.9  PLT 183 137* 113* 121* 127*   Blood Culture    Component Value Date/Time   SDES  06/11/2023 1425    URINE, CLEAN CATCH Performed at [facility], [address] [place], [place] [address]    [facility]  06/11/2023 1425    NONE Performed at [facility], [address] [place], [place] [address]    CULT 70,000 COLONIES/mL ESCHERICHIA COLI (A) 06/11/2023 1425   REPTSTATUS 06/13/2023  FINAL 06/11/2023 1425    Cardiac Enzymes: No results for input(s): "CKTOTAL", "CKMB", "CKMBINDEX", "TROPONINI" in the last 168 hours. CBG: Recent Labs  Lab 06/19/23 1124 06/19/23 1141 06/19/23 1632 06/19/23 2107 06/20/23 0613  GLUCAP 192* 189* 119* 140* 103*   Iron Studies: No results for input(s): "IRON", "TIBC", "TRANSFERRIN", "FERRITIN" in the last 72 hours. @lablastinr3 @ Studies/Results: VAS [place] UPPER EXT VEIN MAPPING (PRE-OP AVF)  Result Date: 06/19/2023 UPPER EXTREMITY VEIN MAPPING Patient Name:  Cassandra Baker  Date of Exam:   06/19/2023 Medical Rec #: [id number]       Accession #:    [id number] Date of Birth: 1941/12/12       Patient Gender: F Patient Age:   82 years Exam Location:  [facility] Procedure:      VAS [place] UPPER EXT VEIN MAPPING (PRE-OP AVF) Referring Phys: [facility] --------------------------------------------------------------------------------  Indications: Pre-access. History: History of failed left brachio-cephalic AVF.  Comparison Study: No recent prior. Vein mapping performed 2020 for prior access. Performing Technologist: Jean Rosenthal RDMS, RVT  Examination Guidelines: A complete evaluation includes B-mode imaging, spectral Doppler, color Doppler, and power Doppler as needed of all accessible portions of each vessel. Bilateral testing is considered an integral part of a complete examination.  Limited examinations for reoccurring indications may be performed as noted. +-----------------+-------------+----------+---------+ Right Cephalic   Diameter (cm)Depth (cm)Findings  +-----------------+-------------+----------+---------+ Shoulder             0.37        0.25             +-----------------+-------------+----------+---------+ Prox upper arm       0.25        0.15   branching +-----------------+-------------+----------+---------+ Mid upper arm        0.19        0.15             +-----------------+-------------+----------+---------+ Dist  upper arm       0.22        0.15             +-----------------+-------------+----------+---------+ Antecubital fossa    0.40        0.43             +-----------------+-------------+----------+---------+ Prox forearm         0.36        0.38             +-----------------+-------------+----------+---------+ Mid forearm          0.29        0.27             +-----------------+-------------+----------+---------+ Dist forearm         0.32        0.23             +-----------------+-------------+----------+---------+ Wrist                0.31        0.21             +-----------------+-------------+----------+---------+ +-----------------+-------------+----------+---------+ Right Basilic    Diameter (cm)Depth (cm)Findings  +-----------------+-------------+----------+---------+ Prox upper arm       0.77                         +-----------------+-------------+----------+---------+ Mid upper arm        0.56                         +-----------------+-------------+----------+---------+ Dist upper arm       0.54                         +-----------------+-------------+----------+---------+ Antecubital fossa    0.35               branching +-----------------+-------------+----------+---------+ Prox forearm         0.33                         +-----------------+-------------+----------+---------+ Mid forearm          0.25                         +-----------------+-------------+----------+---------+ Distal forearm       0.22                         +-----------------+-------------+----------+---------+ Wrist                0.15                         +-----------------+-------------+----------+---------+ +--------------+-------------+----------+--------------+ Left Cephalic Diameter (cm)Depth (cm)   Findings    +--------------+-------------+----------+--------------+  Shoulder          0.39        0.79                   +--------------+-------------+----------+--------------+ Prox upper arm    0.49        0.22                  +--------------+-------------+----------+--------------+ Mid upper arm     0.46        0.18   Thrombosed AVF +--------------+-------------+----------+--------------+ +-----------------+-------------+----------+---------+ Left Basilic     Diameter (cm)Depth (cm)Findings  +-----------------+-------------+----------+---------+ Prox upper arm       0.54                         +-----------------+-------------+----------+---------+ Mid upper arm        0.48                         +-----------------+-------------+----------+---------+ Dist upper arm       0.47                         +-----------------+-------------+----------+---------+ Antecubital fossa    0.35               branching +-----------------+-------------+----------+---------+ Prox forearm         0.35                         +-----------------+-------------+----------+---------+ Mid forearm          0.28                         +-----------------+-------------+----------+---------+ Distal forearm       0.30                         +-----------------+-------------+----------+---------+ Wrist                0.29                         +-----------------+-------------+----------+---------+ *See table(s) above for measurements and observations.  Diagnosing physician: Coral Else MD Electronically signed by Coral Else MD on 06/19/2023 at 10:46:04 PM.    Final    DG CHEST PORT 1 VIEW  Result Date: 06/18/2023 CLINICAL DATA:  Shortness of breath. EXAM: PORTABLE CHEST 1 VIEW COMPARISON:  Chest x-ray dated June 11, 2023. FINDINGS: Unchanged borderline cardiomegaly. New small bilateral pleural effusions with bibasilar atelectasis. No pneumothorax. No acute osseous abnormality. IMPRESSION: 1. New small bilateral pleural effusions with bibasilar atelectasis. Electronically Signed   By: Obie Dredge M.D.   On: 06/18/2023 19:48   Medications:  sodium chloride     sodium chloride     albumin human     anticoagulant sodium citrate     [START ON 06/22/2023]  ceFAZolin (ANCEF) IV      sodium chloride   Intravenous Once   sodium chloride   Intravenous Once   amiodarone  200 mg Oral BID   aspirin EC  81 mg Oral Daily   atorvastatin  80 mg Oral Daily   calcium acetate  667 mg Oral TID WC   Chlorhexidine Gluconate Cloth  6 each Topical Q0600   Chlorhexidine Gluconate Cloth  6 each Topical Q0600   clopidogrel  75 mg Oral  Daily   darbepoetin (ARANESP) injection - DIALYSIS  60 mcg Subcutaneous Q Thu-1800   feeding supplement (NEPRO CARB STEADY)  237 mL Oral BID BM   FLUoxetine  10 mg Oral Once per day on Mon Wed Fri   furosemide  80 mg Oral BID   heparin  2,500 Units Dialysis Once in dialysis   heparin  5,000 Units Subcutaneous Q8H   heparin sodium (porcine)       insulin aspart  0-15 Units Subcutaneous TID WC   levothyroxine  75 mcg Oral QAC breakfast   metoprolol succinate  25 mg Oral Daily   multivitamin  1 tablet Oral QHS   sodium bicarbonate  650 mg Oral BID   sodium chloride flush  3 mL Intravenous Q12H   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 Allena Katz. PMH: CAD, NSTEMI, DMT2, HTN, hypothyroidism.   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 Had 1st HD 6/21 and 2nd HD 6/24 (shortened to 1 hr by pt) at [facility]. Had 3rd HD 6/25 and the 4th HD session on 6/27 here. Overall she is looking much better. Any uremia or dysequilibrium risk I believe is now behind [place]. Next HD Saturday. 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 Likely mediated by ischemia. Cardiology following. 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 G1DD. Appears euvolemic. Optimize volume with HD.  No permanent access: VVS consulted for permanent access. IR consulted for [facility]. Using Temporary catheter. NSTEMI/ CAD - w/ heart cath showing 3V CAD done at [facility]. Underwent PCI to [facility] on 6/27.  HTN/ volume  -BP elevated today. No evidence of volume overload by exam. UF as tolerated.    Hyponatremia - changing to renal diet w/ fluid restriction 1200 cc/d. Na+ 127 today.  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 Tsat done 24%, ferritin 152. Started darbe 60 mcg sq weekly while here.  MBD ckd - CCa in range. Getting phoslo at 1 ac tid. F/u phos improving w/ hd and binders.  Get pth.  A/C systolic CHF - no vol overload. Echo showed LVEF 30-35%.  DM2 Hypothyroid Anxiety/ depression EColi UTI  Cassandra Muckey H. Hamdan Toscano NP-C 06/20/2023, 9:08 AM  [employer] [phone]

## 2023-06-20 NOTE — Progress Notes (Signed)
Approximately 0745-- Pt left for HD with transporter. Report given to HD RN by prior nightshift RN. At time of transport, pt A&O x 4 and VSS. No S/S of distress noted. RN to assess pt upon return to floor.

## 2023-06-20 NOTE — Progress Notes (Addendum)
Received patient in bed.Awake,alert and oriented x 4.She signed her treatment consent.  Medicines given: Robaxin 500 mg.                             Ferric gluconate 125 mg IV.                             Heparin 2,500 units pre-run dose.  Acces used : Right Femoral HD catheter that worked well. Dressing on date.  Duration of treatment : 3.5 HOURS.  Fluid removed ; Achieved prescribed UF goal of 2 liters.  Hemo comment:Tolerated treatment well.  Hand off to the patient's nurse.

## 2023-06-21 DIAGNOSIS — N186 End stage renal disease: Secondary | ICD-10-CM | POA: Diagnosis not present

## 2023-06-21 DIAGNOSIS — D638 Anemia in other chronic diseases classified elsewhere: Secondary | ICD-10-CM

## 2023-06-21 DIAGNOSIS — I2510 Atherosclerotic heart disease of native coronary artery without angina pectoris: Secondary | ICD-10-CM | POA: Diagnosis not present

## 2023-06-21 DIAGNOSIS — I4729 Other ventricular tachycardia: Secondary | ICD-10-CM

## 2023-06-21 DIAGNOSIS — I214 Non-ST elevation (NSTEMI) myocardial infarction: Secondary | ICD-10-CM | POA: Diagnosis not present

## 2023-06-21 DIAGNOSIS — I255 Ischemic cardiomyopathy: Secondary | ICD-10-CM

## 2023-06-21 LAB — CBC
HCT: 30 % — ABNORMAL LOW (ref 36.0–46.0)
Hemoglobin: 9.4 g/dL — ABNORMAL LOW (ref 12.0–15.0)
MCH: 29.9 pg (ref 26.0–34.0)
MCHC: 31.3 g/dL (ref 30.0–36.0)
MCV: 95.5 fL (ref 80.0–100.0)
Platelets: 130 10*3/uL — ABNORMAL LOW (ref 150–400)
RBC: 3.14 MIL/uL — ABNORMAL LOW (ref 3.87–5.11)
RDW: 15.4 % (ref 11.5–15.5)
WBC: 9 10*3/uL (ref 4.0–10.5)
nRBC: 0 % (ref 0.0–0.2)

## 2023-06-21 LAB — GLUCOSE, CAPILLARY
Glucose-Capillary: 127 mg/dL — ABNORMAL HIGH (ref 70–99)
Glucose-Capillary: 233 mg/dL — ABNORMAL HIGH (ref 70–99)
Glucose-Capillary: 117 mg/dL — ABNORMAL HIGH (ref 70–99)
Glucose-Capillary: 202 mg/dL — ABNORMAL HIGH (ref 70–99)

## 2023-06-21 NOTE — Progress Notes (Signed)
Mobility Specialist Progress Note:   06/21/23 1050  Mobility  Activity Ambulated with assistance in hallway  Level of Assistance Contact guard assist, steadying assist  Assistive Device Front wheel walker  Distance Ambulated (ft) 180 ft  Activity Response Tolerated well  Mobility Referral Yes  $Mobility charge 1 Mobility  Mobility Specialist Start Time (ACUTE ONLY) 1050  Mobility Specialist Stop Time (ACUTE ONLY) 1100  Mobility Specialist Time Calculation (min) (ACUTE ONLY) 10 min   Pt agreeable to mobility session. Required only minG assist for safety. Pt stated she feels "shaky and weak". Otherwise no c/o. Pt left sitting EOB with all needs met. Encouraged frequent ambulation.   Addison Lank Mobility Specialist Please contact via SecureChat or  Rehab office at [phone]

## 2023-06-21 NOTE — Progress Notes (Signed)
Approximately 1830-- Informed consent signed by patient for 7/1 procedure and witnessed by this RN. Consent placed in pt chart on [facility].

## 2023-06-21 NOTE — Progress Notes (Signed)
Progress Note  Patient Name: Cassandra Baker Date of Encounter: 06/21/2023  Primary Cardiologist: Julien Nordmann, MD  Subjective   Underwent dialysis, 2.2L fluid removed.  Doing great, no symptoms other than bilateral hip pain.  Inpatient Medications    Scheduled Meds:  sodium chloride   Intravenous Once   sodium chloride   Intravenous Once   amiodarone  200 mg Oral BID   aspirin EC  81 mg Oral Daily   atorvastatin  80 mg Oral Daily   calcium acetate  667 mg Oral TID WC   Chlorhexidine Gluconate Cloth  6 each Topical Q0600   Chlorhexidine Gluconate Cloth  6 each Topical Q0600   clopidogrel  75 mg Oral Daily   darbepoetin (ARANESP) injection - DIALYSIS  60 mcg Subcutaneous Q Thu-1800   feeding supplement (NEPRO CARB STEADY)  237 mL Oral BID BM   FLUoxetine  10 mg Oral Once per day on Mon Wed Fri   furosemide  80 mg Oral BID   heparin  5,000 Units Subcutaneous Q8H   insulin aspart  0-15 Units Subcutaneous TID WC   levothyroxine  75 mcg Oral QAC breakfast   metoprolol succinate  25 mg Oral Daily   multivitamin  1 tablet Oral QHS   sodium bicarbonate  650 mg Oral BID   sodium chloride flush  3 mL Intravenous Q12H   Continuous Infusions:  sodium chloride     sodium chloride     albumin human     anticoagulant sodium citrate     [START ON 06/22/2023]  ceFAZolin (ANCEF) IV     ferric gluconate (FERRLECIT) IVPB Stopped (06/20/23 1216)   PRN Meds: sodium chloride, sodium chloride, acetaminophen, albumin human, alteplase, anticoagulant sodium citrate, heparin, methocarbamol, nitroGLYCERIN, ondansetron (ZOFRAN) IV, mouth rinse, oxyCODONE, sodium chloride flush   Vital Signs    Vitals:   06/20/23 2334 06/21/23 0401 06/21/23 0500 06/21/23 0755  BP: 118/87 115/64  (!) 128/56  Pulse: 72 63  64  Resp: 20 18  18   Temp: 98.6 F (37 C)   97.9 F (36.6 C)  TempSrc: Oral Oral  Oral  SpO2: 97% 96%  98%  Weight:   69.8 kg   Height:        Intake/Output Summary (Last 24 hours)  at 06/21/2023 0910 Last data filed at 06/21/2023 0900 Gross per 24 hour  Intake 557.17 ml  Output 2200 ml  Net -1642.83 ml   Filed Weights   06/20/23 0838 06/20/23 1232 06/21/23 0500  Weight: 71.4 kg 69.2 kg 69.8 kg    Telemetry     Personally reviewed, NSR, no PVCs or NSVT.  ECG    Not performed today  Physical Exam   GEN: No acute distress.   Neck: No JVD. Cardiac: RRR, no murmur, rub, or gallop.  Respiratory: Nonlabored. Clear to auscultation bilaterally. GI: Soft, nontender, bowel sounds present. MS: No edema; No deformity. Neuro:  Nonfocal. Psych: Alert and oriented x 3. Normal affect.  Labs    Chemistry Recent Labs  Lab 06/17/23 0536 06/18/23 0556 06/20/23 0839  NA 130* 124*  128* 127*  K 4.0 3.8  3.9 3.8  CL 89* 89*  88* 88*  CO2 25 24  23 25   GLUCOSE 123* 88  91 105*  BUN 26* 32*  34* 33*  CREATININE 2.94* 3.67*  3.69* 4.07*  CALCIUM 9.0 8.7*  9.0 9.7  ALBUMIN  --  2.5* 2.9*  GFRNONAA 15* 12*  12* 10*  ANIONGAP 16*  11  17* 14     Hematology Recent Labs  Lab 06/19/23 0527 06/20/23 0047 06/21/23 0106  WBC 8.5 8.4 9.0  RBC 3.14* 3.09* 3.14*  HGB 9.2* 9.4* 9.4*  HCT 28.9* 29.0* 30.0*  MCV 92.0 93.9 95.5  MCH 29.3 30.4 29.9  MCHC 31.8 32.4 31.3  RDW 16.0* 15.7* 15.4  PLT 121* 127* 130*    Cardiac Enzymes Recent Labs  Lab 06/03/23 2353 06/04/23 0145 06/05/23 1421 06/15/23 2237 06/16/23 0013  TROPONINIHS 986* 998* 993* 180* 209*    BNPNo results for input(s): "BNP", "PROBNP" in the last 168 hours.   DDimerNo results for input(s): "DDIMER" in the last 168 hours.   Radiology    VAS [place] UPPER EXT VEIN MAPPING (PRE-OP AVF)  Result Date: 06/19/2023 UPPER EXTREMITY VEIN MAPPING Patient Name:  Cassandra Baker  Date of Exam:   06/19/2023 Medical Rec #: [id number]       Accession #:    [id number] Date of Birth: Jan 14, 1941       Patient Gender: F Patient Age:   20 years Exam Location:  [facility] Procedure:      VAS [place] UPPER  EXT VEIN MAPPING (PRE-OP AVF) Referring Phys: [facility] --------------------------------------------------------------------------------  Indications: Pre-access. History: History of failed left brachio-cephalic AVF.  Comparison Study: No recent prior. Vein mapping performed 2020 for prior access. Performing Technologist: Jean Rosenthal RDMS, RVT  Examination Guidelines: A complete evaluation includes B-mode imaging, spectral Doppler, color Doppler, and power Doppler as needed of all accessible portions of each vessel. Bilateral testing is considered an integral part of a complete examination. Limited examinations for reoccurring indications may be performed as noted. +-----------------+-------------+----------+---------+ Right Cephalic   Diameter (cm)Depth (cm)Findings  +-----------------+-------------+----------+---------+ Shoulder             0.37        0.25             +-----------------+-------------+----------+---------+ Prox upper arm       0.25        0.15   branching +-----------------+-------------+----------+---------+ Mid upper arm        0.19        0.15             +-----------------+-------------+----------+---------+ Dist upper arm       0.22        0.15             +-----------------+-------------+----------+---------+ Antecubital fossa    0.40        0.43             +-----------------+-------------+----------+---------+ Prox forearm         0.36        0.38             +-----------------+-------------+----------+---------+ Mid forearm          0.29        0.27             +-----------------+-------------+----------+---------+ Dist forearm         0.32        0.23             +-----------------+-------------+----------+---------+ Wrist                0.31        0.21             +-----------------+-------------+----------+---------+ +-----------------+-------------+----------+---------+ Right Basilic    Diameter (cm)Depth (cm)Findings   +-----------------+-------------+----------+---------+ Prox upper arm  0.77                         +-----------------+-------------+----------+---------+ Mid upper arm        0.56                         +-----------------+-------------+----------+---------+ Dist upper arm       0.54                         +-----------------+-------------+----------+---------+ Antecubital fossa    0.35               branching +-----------------+-------------+----------+---------+ Prox forearm         0.33                         +-----------------+-------------+----------+---------+ Mid forearm          0.25                         +-----------------+-------------+----------+---------+ Distal forearm       0.22                         +-----------------+-------------+----------+---------+ Wrist                0.15                         +-----------------+-------------+----------+---------+ +--------------+-------------+----------+--------------+ Left Cephalic Diameter (cm)Depth (cm)   Findings    +--------------+-------------+----------+--------------+ Shoulder          0.39        0.79                  +--------------+-------------+----------+--------------+ Prox upper arm    0.49        0.22                  +--------------+-------------+----------+--------------+ Mid upper arm     0.46        0.18   Thrombosed AVF +--------------+-------------+----------+--------------+ +-----------------+-------------+----------+---------+ Left Basilic     Diameter (cm)Depth (cm)Findings  +-----------------+-------------+----------+---------+ Prox upper arm       0.54                         +-----------------+-------------+----------+---------+ Mid upper arm        0.48                         +-----------------+-------------+----------+---------+ Dist upper arm       0.47                         +-----------------+-------------+----------+---------+  Antecubital fossa    0.35               branching +-----------------+-------------+----------+---------+ Prox forearm         0.35                         +-----------------+-------------+----------+---------+ Mid forearm          0.28                         +-----------------+-------------+----------+---------+ Distal forearm       0.30                         +-----------------+-------------+----------+---------+  Wrist                0.29                         +-----------------+-------------+----------+---------+ *See table(s) above for measurements and observations.  Diagnosing physician: Coral Else MD Electronically signed by Coral Else MD on 06/19/2023 at 10:46:04 PM.    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  Assessment & Plan    # CAD manifested by NSTEMI in 6/24 s/p intravascular lithotripsy and complicated RCA PCI with residual critical two-vessel CAD (mid LAD 80% and mid LCx 90% stenosis) with LVEF 30 to 35%, currently angina free -Medical therapy for residual two-vessel CAD -Continue DAPT, aspirin 81 mg once daily and Plavix 75 mg once daily. 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 She converted spontaneously and did not require defibrillation. Telemetry reviewed, no PVC or ventricular arrhythmias. Stable on oral amiodarone 200 mg twice daily and metoprolol succinate 25 mg once daily.  # Chronic combined systolic and diastolic heart failure LVEF 30 to 35% # Preparation for hemodialysis, ESRD DD -Underwent HD, removed 2.2L fluid  yesterday -Continue p.o. Lasix 80 mg twice daily -Continue metoprolol succinate 25 mg once daily -Not on ACE/ARB/ARNI/MRA/SGLT2 inhibitors due to ESRD  # Anemia chronic disease versus acute on chronic anemia -Hemoglobin improved from 7.6 to 9.4 after 1 unit PRBC transfusion.   Signed, Marjo Bicker, MD  06/21/2023, 9:10 AM

## 2023-06-21 NOTE — Progress Notes (Signed)
[facility] Progress Note   Subjective: Seen in room. No C/Os. Net UF 2.2 Liters 06/20/2023.     Objective Vitals:   06/20/23 2334 06/21/23 0401 06/21/23 0500 06/21/23 0755  BP: 118/87 115/64  (!) 128/56  Pulse: 72 63  64  Resp: 20 18  18   Temp: 98.6 F (37 C)   97.9 F (36.6 C)  TempSrc: Oral Oral  Oral  SpO2: 97% 96%  98%  Weight:   69.8 kg   Height:       Physical Exam General: Older female in NAD Heart: SR on monitor. S1.S2 1/6 SEM No R/G Lungs: CTAB Abdomen: Soft, NABS Extremities:No LE edema Dialysis Access: R Femoral temp catheter drsg intact   Additional Objective Labs: Basic Metabolic Panel: Recent Labs  Lab 06/16/23 1700 06/17/23 0536 06/18/23 0556 06/20/23 0839  NA 122* 130* 124*  128* 127*  K 4.6 4.0 3.8  3.9 3.8  CL 86* 89* 89*  88* 88*  CO2 23 25 24  23 25   GLUCOSE 146* 123* 88  91 105*  BUN 58* 26* 32*  34* 33*  CREATININE 4.71* 2.94* 3.67*  3.69* 4.07*  CALCIUM 9.1 9.0 8.7*  9.0 9.7  PHOS 6.2*  --  5.8* 4.3   Liver Function Tests: Recent Labs  Lab 06/18/23 0556 06/20/23 0839  ALBUMIN 2.5* 2.9*   No results for input(s): "LIPASE", "AMYLASE" in the last 168 hours. CBC: Recent Labs  Lab 06/17/23 0536 06/18/23 0556 06/19/23 0527 06/20/23 0047 06/21/23 0106  WBC 9.9 7.2 8.5 8.4 9.0  HGB 9.1* 7.6* 9.2* 9.4* 9.4*  HCT 27.9* 23.2* 28.9* 29.0* 30.0*  MCV 94.9 96.3 92.0 93.9 95.5  PLT 137* 113* 121* 127* 130*   Blood Culture    Component Value Date/Time   SDES  06/11/2023 1425    URINE, CLEAN CATCH Performed at [facility], [address] [place], [place] [address]    [facility]  06/11/2023 1425    NONE Performed at [facility], [address] [place], [place] [address]    CULT 70,000 COLONIES/mL ESCHERICHIA COLI (A) 06/11/2023 1425   REPTSTATUS 06/13/2023 FINAL 06/11/2023 1425    Cardiac Enzymes: No results for input(s): "CKTOTAL", "CKMB", "CKMBINDEX", "TROPONINI" in the last  168 hours. CBG: Recent Labs  Lab 06/19/23 2107 06/20/23 0613 06/20/23 2042 06/21/23 0559 06/21/23 1116  GLUCAP 140* 103* 106* 127* 233*   Iron Studies: No results for input(s): "IRON", "TIBC", "TRANSFERRIN", "FERRITIN" in the last 72 hours. @lablastinr3 @ Studies/Results: VAS [place] UPPER EXT VEIN MAPPING (PRE-OP AVF)  Result Date: 06/19/2023 UPPER EXTREMITY VEIN MAPPING Patient Name:  CLAYTON SCHIFFMAN  Date of Exam:   06/19/2023 Medical Rec #: [id number]       Accession #:    [id number] Date of Birth: [birth year]       Patient Gender: F Patient Age:   82 years Exam Location:  [facility] Procedure:      VAS [place] UPPER EXT VEIN MAPPING (PRE-OP AVF) Referring Phys: [facility] --------------------------------------------------------------------------------  Indications: Pre-access. History: History of failed left brachio-cephalic AVF.  Comparison Study: No recent prior. Vein mapping performed 2020 for prior access. Performing Technologist: Jean Rosenthal RDMS, RVT  Examination Guidelines: A complete evaluation includes B-mode imaging, spectral Doppler, color Doppler, and power Doppler as needed of all accessible portions of each vessel. Bilateral testing is considered an integral part of a complete examination. Limited examinations for reoccurring indications may be performed as noted. +-----------------+-------------+----------+---------+ Right Cephalic  Diameter (cm)Depth (cm)Findings  +-----------------+-------------+----------+---------+ Shoulder             0.37        0.25             +-----------------+-------------+----------+---------+ Prox upper arm       0.25        0.15   branching +-----------------+-------------+----------+---------+ Mid upper arm        0.19        0.15             +-----------------+-------------+----------+---------+ Dist upper arm       0.22        0.15             +-----------------+-------------+----------+---------+ Antecubital fossa    0.40         0.43             +-----------------+-------------+----------+---------+ Prox forearm         0.36        0.38             +-----------------+-------------+----------+---------+ Mid forearm          0.29        0.27             +-----------------+-------------+----------+---------+ Dist forearm         0.32        0.23             +-----------------+-------------+----------+---------+ Wrist                0.31        0.21             +-----------------+-------------+----------+---------+ +-----------------+-------------+----------+---------+ Right Basilic    Diameter (cm)Depth (cm)Findings  +-----------------+-------------+----------+---------+ Prox upper arm       0.77                         +-----------------+-------------+----------+---------+ Mid upper arm        0.56                         +-----------------+-------------+----------+---------+ Dist upper arm       0.54                         +-----------------+-------------+----------+---------+ Antecubital fossa    0.35               branching +-----------------+-------------+----------+---------+ Prox forearm         0.33                         +-----------------+-------------+----------+---------+ Mid forearm          0.25                         +-----------------+-------------+----------+---------+ Distal forearm       0.22                         +-----------------+-------------+----------+---------+ Wrist                0.15                         +-----------------+-------------+----------+---------+ +--------------+-------------+----------+--------------+ Left Cephalic Diameter (cm)Depth (cm)   Findings    +--------------+-------------+----------+--------------+ Shoulder          0.39  0.79                  +--------------+-------------+----------+--------------+ Prox upper arm    0.49        0.22                   +--------------+-------------+----------+--------------+ Mid upper arm     0.46        0.18   Thrombosed AVF +--------------+-------------+----------+--------------+ +-----------------+-------------+----------+---------+ Left Basilic     Diameter (cm)Depth (cm)Findings  +-----------------+-------------+----------+---------+ Prox upper arm       0.54                         +-----------------+-------------+----------+---------+ Mid upper arm        0.48                         +-----------------+-------------+----------+---------+ Dist upper arm       0.47                         +-----------------+-------------+----------+---------+ Antecubital fossa    0.35               branching +-----------------+-------------+----------+---------+ Prox forearm         0.35                         +-----------------+-------------+----------+---------+ Mid forearm          0.28                         +-----------------+-------------+----------+---------+ Distal forearm       0.30                         +-----------------+-------------+----------+---------+ Wrist                0.29                         +-----------------+-------------+----------+---------+ *See table(s) above for measurements and observations.  Diagnosing physician: Coral Else MD Electronically signed by Coral Else MD on 06/19/2023 at 10:46:04 PM.    Final    Medications:  sodium chloride     sodium chloride     albumin human     anticoagulant sodium citrate     [START ON 06/22/2023]  ceFAZolin (ANCEF) IV     ferric gluconate (FERRLECIT) IVPB Stopped (06/20/23 1216)    sodium chloride   Intravenous Once   sodium chloride   Intravenous Once   amiodarone  200 mg Oral BID   aspirin EC  81 mg Oral Daily   atorvastatin  80 mg Oral Daily   calcium acetate  667 mg Oral TID WC   Chlorhexidine Gluconate Cloth  6 each Topical Q0600   Chlorhexidine Gluconate Cloth  6 each Topical Q0600   clopidogrel   75 mg Oral Daily   darbepoetin (ARANESP) injection - DIALYSIS  60 mcg Subcutaneous Q Thu-1800   feeding supplement (NEPRO CARB STEADY)  237 mL Oral BID BM   FLUoxetine  10 mg Oral Once per day on Mon Wed Fri   furosemide  80 mg Oral BID   heparin  5,000 Units Subcutaneous Q8H   insulin aspart  0-15 Units Subcutaneous TID WC   levothyroxine  75 mcg Oral QAC breakfast   metoprolol succinate  25 mg Oral Daily   multivitamin  1 tablet Oral QHS   sodium bicarbonate  650 mg Oral BID   sodium chloride flush  3 mL Intravenous Q12H     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 Allena Katz. PMH: CAD, NSTEMI, DMT2, HTN, hypothyroidism.    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 Had 1st HD 6/21 and 2nd HD 6/24 (shortened to 1 hr by pt) at [facility]. Had 3rd HD 6/25 and the 4th HD session on 6/27 here.  Next HD 06/23/2023 Polymorphic VT-seen by cards. Likely mediated by ischemia. Cardiology following. 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 G1DD. Appears euvolemic. Optimize volume with HD.  No permanent access: VVS consulted for permanent access. IR consulted for [facility]. Using Temporary catheter. NSTEMI/ CAD - w/ heart cath showing 3V CAD done at [facility]. Underwent PCI to RCA on 06/18/2023. Cardiology following.   HTN/ volume -BP elevated today. No evidence of volume overload by exam. UF as tolerated.    Hyponatremia - changing to renal diet w/ fluid restriction 1200 cc/d. Na+ 127 today.  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 Tsat done 24%, ferritin 152. Started darbe 60 mcg sq weekly while here.  MBD ckd - CCa in range. Getting phoslo at 1 ac tid. F/u phos improving w/ hd and binders.  Get pth.  A/C systolic CHF - no vol overload. Echo showed LVEF 30-35%.  DM2 Hypothyroid Anxiety/ depression EColi UTI  Markeria Goetsch H. Amora Sheehy NP-C 06/21/2023, 11:31 AM  [employer] [phone]

## 2023-06-22 ENCOUNTER — Encounter (HOSPITAL_COMMUNITY)
Admission: AD | Disposition: A | Discharge status: Home / Self Care | Payer: Self-pay | Source: Other Acute Inpatient Hospital | Attending: Cardiovascular Disease

## 2023-06-22 ENCOUNTER — Inpatient Hospital Stay (HOSPITAL_COMMUNITY): Payer: Medicare Other

## 2023-06-22 ENCOUNTER — Other Ambulatory Visit: Payer: Self-pay

## 2023-06-22 ENCOUNTER — Encounter (HOSPITAL_COMMUNITY): Payer: Self-pay | Admitting: Cardiovascular Disease

## 2023-06-22 ENCOUNTER — Inpatient Hospital Stay (HOSPITAL_COMMUNITY): Payer: Medicare Other | Admitting: Anesthesiology

## 2023-06-22 DIAGNOSIS — E1122 Type 2 diabetes mellitus with diabetic chronic kidney disease: Secondary | ICD-10-CM

## 2023-06-22 DIAGNOSIS — Z992 Dependence on renal dialysis: Secondary | ICD-10-CM

## 2023-06-22 DIAGNOSIS — N186 End stage renal disease: Secondary | ICD-10-CM

## 2023-06-22 DIAGNOSIS — N185 Chronic kidney disease, stage 5: Secondary | ICD-10-CM

## 2023-06-22 DIAGNOSIS — I132 Hypertensive heart and chronic kidney disease with heart failure and with stage 5 chronic kidney disease, or end stage renal disease: Secondary | ICD-10-CM

## 2023-06-22 DIAGNOSIS — I5043 Acute on chronic combined systolic (congestive) and diastolic (congestive) heart failure: Secondary | ICD-10-CM

## 2023-06-22 HISTORY — PX: AV FISTULA PLACEMENT: SHX1204

## 2023-06-22 HISTORY — PX: INSERTION OF DIALYSIS CATHETER: SHX1324

## 2023-06-22 LAB — CBC
HCT: 29.9 % — ABNORMAL LOW (ref 36.0–46.0)
Hemoglobin: 9.5 g/dL — ABNORMAL LOW (ref 12.0–15.0)
MCH: 30.5 pg (ref 26.0–34.0)
MCHC: 31.8 g/dL (ref 30.0–36.0)
MCV: 96.1 fL (ref 80.0–100.0)
Platelets: 140 10*3/uL — ABNORMAL LOW (ref 150–400)
RBC: 3.11 MIL/uL — ABNORMAL LOW (ref 3.87–5.11)
RDW: 15.3 % (ref 11.5–15.5)
WBC: 9.5 10*3/uL (ref 4.0–10.5)
nRBC: 0 % (ref 0.0–0.2)

## 2023-06-22 LAB — POCT I-STAT, CHEM 8
BUN: 37 mg/dL — ABNORMAL HIGH (ref 8–23)
Calcium, Ion: 1.28 mmol/L (ref 1.15–1.40)
Chloride: 91 mmol/L — ABNORMAL LOW (ref 98–111)
Creatinine, Ser: 4.5 mg/dL — ABNORMAL HIGH (ref 0.44–1.00)
Glucose, Bld: 108 mg/dL — ABNORMAL HIGH (ref 70–99)
HCT: 35 % — ABNORMAL LOW (ref 36.0–46.0)
Hemoglobin: 11.9 g/dL — ABNORMAL LOW (ref 12.0–15.0)
Potassium: 4.3 mmol/L (ref 3.5–5.1)
Sodium: 127 mmol/L — ABNORMAL LOW (ref 135–145)
TCO2: 26 mmol/L (ref 22–32)

## 2023-06-22 LAB — GLUCOSE, CAPILLARY
Glucose-Capillary: 123 mg/dL — ABNORMAL HIGH (ref 70–99)
Glucose-Capillary: 124 mg/dL — ABNORMAL HIGH (ref 70–99)
Glucose-Capillary: 108 mg/dL — ABNORMAL HIGH (ref 70–99)
Glucose-Capillary: 99 mg/dL (ref 70–99)
Glucose-Capillary: 128 mg/dL — ABNORMAL HIGH (ref 70–99)
Glucose-Capillary: 184 mg/dL — ABNORMAL HIGH (ref 70–99)

## 2023-06-22 LAB — HEPATITIS B SURFACE ANTIGEN: Hepatitis B Surface Ag: NONREACTIVE

## 2023-06-22 SURGERY — ARTERIOVENOUS (AV) FISTULA CREATION
Anesthesia: General | Site: Chest

## 2023-06-22 MED ORDER — 0.9 % SODIUM CHLORIDE (POUR BTL) OPTIME
TOPICAL | Status: DC | PRN
  Administered 2023-06-22: 1000 mL

## 2023-06-22 MED ORDER — PROPOFOL 10 MG/ML IV BOLUS
INTRAVENOUS | Status: AC
  Filled 2023-06-22: qty 20

## 2023-06-22 MED ORDER — HEPARIN SODIUM (PORCINE) 1000 UNIT/ML IJ SOLN
INTRAMUSCULAR | Status: DC | PRN
  Administered 2023-06-22: 3200 [IU]

## 2023-06-22 MED ORDER — EPHEDRINE SULFATE-NACL 50-0.9 MG/10ML-% IV SOSY
PREFILLED_SYRINGE | INTRAVENOUS | Status: DC | PRN
  Administered 2023-06-22: 5 mg via INTRAVENOUS

## 2023-06-22 MED ORDER — OXYCODONE HCL 5 MG PO TABS: 5.0000 mg | ORAL_TABLET | ORAL | Status: DC | PRN

## 2023-06-22 MED ORDER — ONDANSETRON HCL 4 MG/2ML IJ SOLN
INTRAMUSCULAR | Status: AC
  Filled 2023-06-22: qty 2

## 2023-06-22 MED ORDER — HEPARIN 6000 UNIT IRRIGATION SOLUTION
Status: DC | PRN
  Administered 2023-06-22: 1

## 2023-06-22 MED ORDER — SUGAMMADEX SODIUM 200 MG/2ML IV SOLN
INTRAVENOUS | Status: DC | PRN
  Administered 2023-06-22: 200 mg via INTRAVENOUS

## 2023-06-22 MED ORDER — FENTANYL CITRATE (PF) 250 MCG/5ML IJ SOLN
INTRAMUSCULAR | Status: DC | PRN
  Administered 2023-06-22 (×2): 25 ug via INTRAVENOUS

## 2023-06-22 MED ORDER — ESMOLOL HCL 100 MG/10ML IV SOLN
INTRAVENOUS | Status: DC | PRN
  Administered 2023-06-22: 20 mg via INTRAVENOUS
  Administered 2023-06-22: 10 mg via INTRAVENOUS

## 2023-06-22 MED ORDER — CHLORHEXIDINE GLUCONATE 0.12 % MT SOLN: 15.0000 mL | Freq: Once | OROMUCOSAL | Status: AC

## 2023-06-22 MED ORDER — DEXAMETHASONE SODIUM PHOSPHATE 10 MG/ML IJ SOLN
INTRAMUSCULAR | Status: DC | PRN
  Administered 2023-06-22: 4 mg via INTRAVENOUS

## 2023-06-22 MED ORDER — ROCURONIUM BROMIDE 10 MG/ML (PF) SYRINGE
PREFILLED_SYRINGE | INTRAVENOUS | Status: DC | PRN
  Administered 2023-06-22: 10 mg via INTRAVENOUS
  Administered 2023-06-22: 40 mg via INTRAVENOUS

## 2023-06-22 MED ORDER — LIDOCAINE 2% (20 MG/ML) 5 ML SYRINGE
INTRAMUSCULAR | Status: DC | PRN
  Administered 2023-06-22: 60 mg via INTRAVENOUS

## 2023-06-22 MED ORDER — HEPARIN 6000 UNIT IRRIGATION SOLUTION
Status: AC
  Filled 2023-06-22: qty 500

## 2023-06-22 MED ORDER — SODIUM CHLORIDE 0.9 % IV SOLN: INTRAVENOUS | Status: DC

## 2023-06-22 MED ORDER — ROCURONIUM BROMIDE 10 MG/ML (PF) SYRINGE
PREFILLED_SYRINGE | INTRAVENOUS | Status: AC
  Filled 2023-06-22: qty 10

## 2023-06-22 MED ORDER — LIDOCAINE 2% (20 MG/ML) 5 ML SYRINGE
INTRAMUSCULAR | Status: AC
  Filled 2023-06-22: qty 5

## 2023-06-22 MED ORDER — CHLORHEXIDINE GLUCONATE 0.12 % MT SOLN
OROMUCOSAL | Status: AC
  Administered 2023-06-22: 15 mL via OROMUCOSAL
  Filled 2023-06-22: qty 15

## 2023-06-22 MED ORDER — PHENYLEPHRINE 80 MCG/ML (10ML) SYRINGE FOR IV PUSH (FOR BLOOD PRESSURE SUPPORT)
PREFILLED_SYRINGE | INTRAVENOUS | Status: AC
  Filled 2023-06-22: qty 10

## 2023-06-22 MED ORDER — HEPARIN SODIUM (PORCINE) 1000 UNIT/ML IJ SOLN
INTRAMUSCULAR | Status: AC
  Filled 2023-06-22: qty 10

## 2023-06-22 MED ORDER — PHENYLEPHRINE 80 MCG/ML (10ML) SYRINGE FOR IV PUSH (FOR BLOOD PRESSURE SUPPORT)
PREFILLED_SYRINGE | INTRAVENOUS | Status: DC | PRN
  Administered 2023-06-22 (×3): 80 ug via INTRAVENOUS

## 2023-06-22 MED ORDER — GLYCOPYRROLATE 0.2 MG/ML IJ SOLN
INTRAMUSCULAR | Status: DC | PRN
  Administered 2023-06-22: .2 mg via INTRAVENOUS

## 2023-06-22 MED ORDER — PROPOFOL 10 MG/ML IV BOLUS
INTRAVENOUS | Status: DC | PRN
  Administered 2023-06-22: 80 mg via INTRAVENOUS

## 2023-06-22 MED ORDER — LIDOCAINE HCL (PF) 1 % IJ SOLN
INTRAMUSCULAR | Status: AC
  Filled 2023-06-22: qty 30

## 2023-06-22 MED ORDER — ONDANSETRON HCL 4 MG/2ML IJ SOLN
INTRAMUSCULAR | Status: DC | PRN
  Administered 2023-06-22: 4 mg via INTRAVENOUS

## 2023-06-22 MED ORDER — ORAL CARE MOUTH RINSE: 15.0000 mL | Freq: Once | OROMUCOSAL | Status: AC

## 2023-06-22 MED ORDER — PHENYLEPHRINE HCL-NACL 20-0.9 MG/250ML-% IV SOLN
INTRAVENOUS | Status: DC | PRN
  Administered 2023-06-22: 50 ug/min via INTRAVENOUS

## 2023-06-22 MED ORDER — FENTANYL CITRATE (PF) 250 MCG/5ML IJ SOLN
INTRAMUSCULAR | Status: AC
  Filled 2023-06-22: qty 5

## 2023-06-22 SURGICAL SUPPLY — 61 items
ADH SKN CLS APL DERMABOND .7 (GAUZE/BANDAGES/DRESSINGS) ×4
ARMBAND PINK RESTRICT EXTREMIT (MISCELLANEOUS) ×3 IMPLANT
BAG COUNTER SPONGE SURGICOUNT (BAG) ×3 IMPLANT
BAG DECANTER FOR FLEXI CONT (MISCELLANEOUS) ×3 IMPLANT
BAG SPNG CNTER NS LX DISP (BAG) ×2
BIOPATCH RED 1 DISK 7.0 (GAUZE/BANDAGES/DRESSINGS) ×3 IMPLANT
BLADE CLIPPER SURG (BLADE) ×3 IMPLANT
BNDG ELASTIC 4X5.8 VLCR STR LF (GAUZE/BANDAGES/DRESSINGS) ×3 IMPLANT
CANISTER SUCT 3000ML PPV (MISCELLANEOUS) ×3 IMPLANT
CATH PALINDROME-P 19CM W/VT (CATHETERS) IMPLANT
CATH PALINDROME-P 23CM W/VT (CATHETERS) IMPLANT
CATH PALINDROME-P 28CM W/VT (CATHETERS) IMPLANT
CATH STRAIGHT 5FR 65CM (CATHETERS) IMPLANT
CLIP TI MEDIUM 6 (CLIP) ×6 IMPLANT
CLIP TI WIDE RED SMALL 6 (CLIP) ×3 IMPLANT
COVER PROBE W GEL 5X96 (DRAPES) ×3 IMPLANT
COVER SURGICAL LIGHT HANDLE (MISCELLANEOUS) ×3 IMPLANT
DERMABOND ADVANCED .7 DNX12 (GAUZE/BANDAGES/DRESSINGS) ×3 IMPLANT
DRAPE C-ARM 42X72 X-RAY (DRAPES) ×3 IMPLANT
DRAPE CHEST BREAST 15X10 FENES (DRAPES) ×3 IMPLANT
ELECT REM PT RETURN 9FT ADLT (ELECTROSURGICAL) ×2
ELECTRODE REM PT RTRN 9FT ADLT (ELECTROSURGICAL) ×3 IMPLANT
GAUZE 4X4 16PLY ~~LOC~~+RFID DBL (SPONGE) ×3 IMPLANT
GLOVE BIOGEL PI IND STRL 8 (GLOVE) ×3 IMPLANT
GOWN STRL REUS W/ TWL LRG LVL3 (GOWN DISPOSABLE) ×6 IMPLANT
GOWN STRL REUS W/TWL 2XL LVL3 (GOWN DISPOSABLE) ×6 IMPLANT
GOWN STRL REUS W/TWL LRG LVL3 (GOWN DISPOSABLE) ×8
KIT BASIN OR (CUSTOM PROCEDURE TRAY) ×3 IMPLANT
KIT PALINDROME-P 55CM (CATHETERS) IMPLANT
KIT TURNOVER KIT B (KITS) ×3 IMPLANT
NDL 18GX1X1/2 (RX/OR ONLY) (NEEDLE) ×3 IMPLANT
NDL HYPO 25GX1X1/2 BEV (NEEDLE) ×3 IMPLANT
NEEDLE 18GX1X1/2 (RX/OR ONLY) (NEEDLE) IMPLANT
NEEDLE HYPO 25GX1X1/2 BEV (NEEDLE) IMPLANT
NS IRRIG 1000ML POUR BTL (IV SOLUTION) ×3 IMPLANT
PACK BASIC III (CUSTOM PROCEDURE TRAY)
PACK CV ACCESS (CUSTOM PROCEDURE TRAY) ×3 IMPLANT
PACK SRG BSC III STRL LF ECLPS (CUSTOM PROCEDURE TRAY) ×3 IMPLANT
PAD ARMBOARD 7.5X6 YLW CONV (MISCELLANEOUS) ×6 IMPLANT
SET MICROPUNCTURE 5F STIFF (MISCELLANEOUS) IMPLANT
SLING ARM FOAM STRAP LRG (SOFTGOODS) IMPLANT
SLING ARM FOAM STRAP MED (SOFTGOODS) IMPLANT
SOAP 2 % CHG 4 OZ (WOUND CARE) ×3 IMPLANT
SPIKE FLUID TRANSFER (MISCELLANEOUS) ×3 IMPLANT
SUT ETHILON 3 0 PS 1 (SUTURE) ×3 IMPLANT
SUT MNCRL AB 4-0 PS2 18 (SUTURE) ×3 IMPLANT
SUT PROLENE 6 0 BV (SUTURE) ×3 IMPLANT
SUT PROLENE 7 0 BV 1 (SUTURE) IMPLANT
SUT SILK 2 0 SH (SUTURE) IMPLANT
SUT SILK 3 0 SH CR/8 (SUTURE) ×3 IMPLANT
SUT VIC AB 3-0 SH 27 (SUTURE) ×4
SUT VIC AB 3-0 SH 27X BRD (SUTURE) ×3 IMPLANT
SYR 10ML LL (SYRINGE) ×3 IMPLANT
SYR 20ML LL LF (SYRINGE) ×6 IMPLANT
SYR 5ML LL (SYRINGE) ×3 IMPLANT
SYR CONTROL 10ML LL (SYRINGE) ×3 IMPLANT
TOWEL GREEN STERILE (TOWEL DISPOSABLE) ×3 IMPLANT
TOWEL GREEN STERILE FF (TOWEL DISPOSABLE) ×3 IMPLANT
UNDERPAD 30X36 HEAVY ABSORB (UNDERPADS AND DIAPERS) ×3 IMPLANT
WATER STERILE IRR 1000ML POUR (IV SOLUTION) ×3 IMPLANT
WIRE AMPLATZ SS-J .035X180CM (WIRE) IMPLANT

## 2023-06-22 NOTE — Care Management Important Message (Signed)
Important Message  Patient Details  Name: Cassandra Baker MRN: [id number] Date of Birth: [birth year]   Medicare Important Message Given:  Yes     Renie Ora 06/22/2023, 8:57 AM

## 2023-06-22 NOTE — Anesthesia Preprocedure Evaluation (Addendum)
Anesthesia Evaluation  Patient identified by MRN, date of birth, ID band Patient awake    Reviewed: Allergy & Precautions, NPO status , Patient's Chart, lab work & pertinent test results  Airway Mallampati: II  TM Distance: >3 FB Neck ROM: Full    Dental no notable dental hx.    Pulmonary shortness of breath, asthma    Pulmonary exam normal breath sounds clear to auscultation       Cardiovascular hypertension, Pt. on medications + CAD, + Past MI and +CHF  Normal cardiovascular exam Rhythm:Regular Rate:Normal  Cath 05/2023   Mid LAD lesion is 80% stenosed.   Mid Cx to Dist Cx lesion is 90% stenosed.   Ramus lesion is 60% stenosed.   RPDA lesion is 90% stenosed.   Prox RCA lesion is 80% stenosed.   Prox RCA to Mid RCA lesion is 99% stenosed.   Mid RCA lesion is 70% stenosed.   RPAV lesion is 60% stenosed.   A drug-eluting stent was successfully placed using a SYNERGY XD 3.0X48.   A drug-eluting stent was successfully placed using a SYNERGY XD 3.50X16.   Post intervention, there is a 0% residual stenosis.   Post intervention, there is a 0% residual stenosis.   Post intervention, there is a 0% residual stenosis.   1. Moderately to severely elevated left ventricular end-diastolic pressure at 32 mmHg. 2. Successful complex intravascular lithotripsy and 2 overlapped drug-eluting stent placement to the mid and proximal right coronary artery.  Very difficult procedure overall due to heavy calcifications and difficulty crossing the stenosis in the mid right coronary artery.  The distal RCA bifurcation disease appeared better than before likely due to improved upstream flow and thus I elected not to treat. 3.  The patient did go into polymorphic ventricular tachycardia that lasted for about 10 seconds before starting the procedure.  She converted and did not require defibrillation. 4.  Difficulty sedating the patient with  continuous involuntary movements of her lower extremities throughout the case.   Recommendations: Dual antiplatelet therapy for at least 12 months. I discontinued heparin drip. I switch amiodarone drip to oral. Recommend treating the LAD disease medically.   Echo 05/2023  1. Left ventricular ejection fraction, by estimation, is 30 to 35%. The left ventricle has moderately decreased function. The left ventricle demonstrates mild global hypokinesis with severe hypokinesis of the inferior/posterior/septal wall. There is mild left ventricular hypertrophy. Left ventricular diastolic parameters are consistent with Grade I diastolic dysfunction (impaired relaxation).   2. Right ventricular systolic function is normal. The right ventricular size is normal.   3. The mitral valve is normal in structure. Moderate mitral valve regurgitation. No evidence of mitral stenosis. Moderate mitral annular calcification.   4. The aortic valve is normal in structure. Aortic valve regurgitation is not visualized. No aortic stenosis is present. Aortic valve mean gradient measures 8.0 mmHg.   5. The inferior vena cava is normal in size with greater than 50% respiratory variability, suggesting right atrial pressure of 3 mmHg.    Neuro/Psych  PSYCHIATRIC DISORDERS Anxiety Depression       GI/Hepatic   Endo/Other  diabetes, Type 2Hypothyroidism    Renal/GU Renal InsufficiencyRenal disease     Musculoskeletal  (+) Arthritis ,    Abdominal   Peds  Hematology  (+) Blood dyscrasia, anemia   Anesthesia Other Findings   Reproductive/Obstetrics  Anesthesia Physical Anesthesia Plan  ASA: 4  Anesthesia Plan: General   Post-op Pain Management: Tylenol PO (pre-op)*   Induction:   PONV Risk Score and Plan: 3 and Treatment may vary due to age or medical condition, Ondansetron and Dexamethasone  Airway Management Planned: Oral ETT  Additional Equipment:  ClearSight  Intra-op Plan:   Post-operative Plan: Extubation in OR  Informed Consent: I have reviewed the patients History and Physical, chart, labs and discussed the procedure including the risks, benefits and alternatives for the proposed anesthesia with the patient or authorized representative who has indicated his/her understanding and acceptance.     Dental advisory given  Plan Discussed with: CRNA  Anesthesia Plan Comments:         Anesthesia Quick Evaluation

## 2023-06-22 NOTE — Progress Notes (Signed)
Following pt's case to assist with out-pt HD placement at d/c. Pt will need a MWF schedule at d/c for religious reasons. Met with pt and pt's son last week and pt's d/c plan was not confirmed at that time (pt to return home vs staying with family at d/c). Attempted to call pt's son to request update on d/c plan but had to leave a message requesting a return call. Contacted TOC staff as well to inquire if they had an update on d/c plan. Will follow and assist as needed.   Olivia Canter Renal Navigator [phone]

## 2023-06-22 NOTE — Progress Notes (Signed)
Progress Note  Patient Name: [name] Date of Encounter: 06/21/2023  Primary Cardiologist: Julien Nordmann, MD  Subjective   No chest pain or dyspnea. No events overnight  Inpatient Medications    Scheduled Meds:  sodium chloride   Intravenous Once   sodium chloride   Intravenous Once   amiodarone  200 mg Oral BID   aspirin EC  81 mg Oral Daily   atorvastatin  80 mg Oral Daily   calcium acetate  667 mg Oral TID WC   Chlorhexidine Gluconate Cloth  6 each Topical Q0600   Chlorhexidine Gluconate Cloth  6 each Topical Q0600   clopidogrel  75 mg Oral Daily   darbepoetin (ARANESP) injection - DIALYSIS  60 mcg Subcutaneous Q Thu-1800   feeding supplement (NEPRO CARB STEADY)  237 mL Oral BID BM   FLUoxetine  10 mg Oral Once per day on Mon Wed Fri   furosemide  80 mg Oral BID   heparin  5,000 Units Subcutaneous Q8H   insulin aspart  0-15 Units Subcutaneous TID WC   levothyroxine  75 mcg Oral QAC breakfast   metoprolol succinate  25 mg Oral Daily   multivitamin  1 tablet Oral QHS   sodium bicarbonate  650 mg Oral BID   sodium chloride flush  3 mL Intravenous Q12H   Continuous Infusions:  sodium chloride     sodium chloride     albumin human     anticoagulant sodium citrate     [START ON 06/22/2023]  ceFAZolin (ANCEF) IV     ferric gluconate (FERRLECIT) IVPB Stopped (06/20/23 1216)   PRN Meds: sodium chloride, sodium chloride, acetaminophen, albumin human, alteplase, anticoagulant sodium citrate, heparin, methocarbamol, nitroGLYCERIN, ondansetron (ZOFRAN) IV, mouth rinse, oxyCODONE, sodium chloride flush   Vital Signs    Vitals:   06/20/23 2334 06/21/23 0401 06/21/23 0500 06/21/23 0755  BP: 118/87 115/64  (!) 128/56  Pulse: 72 63  64  Resp: 20 18  18   Temp: 98.6 F (37 C)   97.9 F (36.6 C)  TempSrc: Oral Oral  Oral  SpO2: 97% 96%  98%  Weight:   69.8 kg   Height:        Intake/Output Summary (Last 24 hours) at 06/21/2023 0910 Last data filed at 06/21/2023  0900 Gross per 24 hour  Intake 557.17 ml  Output 2200 ml  Net -1642.83 ml   Filed Weights   06/20/23 0838 06/20/23 1232 06/21/23 0500  Weight: 71.4 kg 69.2 kg 69.8 kg    Telemetry    Sinus-Personally reviewed  ECG    No am EKG  Physical Exam   General: Well developed, well nourished, NAD  HEENT: OP clear, mucus membranes moist  SKIN: warm, dry. No rashes. Neuro: No focal deficits  Musculoskeletal: Muscle strength 5/5 all ext  Psychiatric: Mood and affect normal  Neck: No JVD Lungs:Clear bilaterally, no wheezes, rhonci, crackles Cardiovascular: Regular rate and rhythm. No murmurs, gallops or rubs. Abdomen:Soft.  Extremities: No lower extremity edema.    Labs    Chemistry Recent Labs  Lab 06/17/23 0536 06/18/23 0556 06/20/23 0839  NA 130* 124*  128* 127*  K 4.0 3.8  3.9 3.8  CL 89* 89*  88* 88*  CO2 25 24  23 25   GLUCOSE 123* 88  91 105*  BUN 26* 32*  34* 33*  CREATININE 2.94* 3.67*  3.69* 4.07*  CALCIUM 9.0 8.7*  9.0 9.7  ALBUMIN  --  2.5* 2.9*  GFRNONAA 15*  12*  12* 10*  ANIONGAP 16* 11  17* 14     Hematology Recent Labs  Lab 06/19/23 0527 06/20/23 0047 06/21/23 0106  WBC 8.5 8.4 9.0  RBC 3.14* 3.09* 3.14*  HGB 9.2* 9.4* 9.4*  HCT 28.9* 29.0* 30.0*  MCV 92.0 93.9 95.5  MCH 29.3 30.4 29.9  MCHC 31.8 32.4 31.3  RDW 16.0* 15.7* 15.4  PLT 121* 127* 130*    Cardiac Enzymes Recent Labs  Lab 06/03/23 2353 06/04/23 0145 06/05/23 1421 06/15/23 2237 06/16/23 0013  TROPONINIHS 986* 998* 993* 180* 209*    BNPNo results for input(s): "BNP", "PROBNP" in the last 168 hours.   DDimerNo results for input(s): "DDIMER" in the last 168 hours.   Radiology    VAS [place] UPPER EXT VEIN MAPPING (PRE-OP AVF)  Result Date: 06/19/2023 UPPER EXTREMITY VEIN MAPPING Patient Name:  TAMME HOMEWOOD  Date of Exam:   06/19/2023 Medical Rec #: [id number]       Accession #:    [id number] Date of Birth: 06/05/1941       Patient Gender: F Patient Age:   89 years  Exam Location:  [facility] Procedure:      VAS [place] UPPER EXT VEIN MAPPING (PRE-OP AVF) Referring Phys: [facility] --------------------------------------------------------------------------------  Indications: Pre-access. History: History of failed left brachio-cephalic AVF.  Comparison Study: No recent prior. Vein mapping performed 2020 for prior access. Performing Technologist: Jean Rosenthal RDMS, RVT  Examination Guidelines: A complete evaluation includes B-mode imaging, spectral Doppler, color Doppler, and power Doppler as needed of all accessible portions of each vessel. Bilateral testing is considered an integral part of a complete examination. Limited examinations for reoccurring indications may be performed as noted. +-----------------+-------------+----------+---------+ Right Cephalic   Diameter (cm)Depth (cm)Findings  +-----------------+-------------+----------+---------+ Shoulder             0.37        0.25             +-----------------+-------------+----------+---------+ Prox upper arm       0.25        0.15   branching +-----------------+-------------+----------+---------+ Mid upper arm        0.19        0.15             +-----------------+-------------+----------+---------+ Dist upper arm       0.22        0.15             +-----------------+-------------+----------+---------+ Antecubital fossa    0.40        0.43             +-----------------+-------------+----------+---------+ Prox forearm         0.36        0.38             +-----------------+-------------+----------+---------+ Mid forearm          0.29        0.27             +-----------------+-------------+----------+---------+ Dist forearm         0.32        0.23             +-----------------+-------------+----------+---------+ Wrist                0.31        0.21             +-----------------+-------------+----------+---------+  +-----------------+-------------+----------+---------+ Right Basilic    Diameter (cm)Depth (cm)Findings  +-----------------+-------------+----------+---------+ Prox upper  arm       0.77                         +-----------------+-------------+----------+---------+ Mid upper arm        0.56                         +-----------------+-------------+----------+---------+ Dist upper arm       0.54                         +-----------------+-------------+----------+---------+ Antecubital fossa    0.35               branching +-----------------+-------------+----------+---------+ Prox forearm         0.33                         +-----------------+-------------+----------+---------+ Mid forearm          0.25                         +-----------------+-------------+----------+---------+ Distal forearm       0.22                         +-----------------+-------------+----------+---------+ Wrist                0.15                         +-----------------+-------------+----------+---------+ +--------------+-------------+----------+--------------+ Left Cephalic Diameter (cm)Depth (cm)   Findings    +--------------+-------------+----------+--------------+ Shoulder          0.39        0.79                  +--------------+-------------+----------+--------------+ Prox upper arm    0.49        0.22                  +--------------+-------------+----------+--------------+ Mid upper arm     0.46        0.18   Thrombosed AVF +--------------+-------------+----------+--------------+ +-----------------+-------------+----------+---------+ Left Basilic     Diameter (cm)Depth (cm)Findings  +-----------------+-------------+----------+---------+ Prox upper arm       0.54                         +-----------------+-------------+----------+---------+ Mid upper arm        0.48                         +-----------------+-------------+----------+---------+ Dist  upper arm       0.47                         +-----------------+-------------+----------+---------+ Antecubital fossa    0.35               branching +-----------------+-------------+----------+---------+ Prox forearm         0.35                         +-----------------+-------------+----------+---------+ Mid forearm          0.28                         +-----------------+-------------+----------+---------+ Distal forearm  0.30                         +-----------------+-------------+----------+---------+ Wrist                0.29                         +-----------------+-------------+----------+---------+ *See table(s) above for measurements and observations.  Diagnosing physician: Coral Else MD Electronically signed by Coral Else MD on 06/19/2023 at 10:46:04 PM.    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 She is now post PCI of the RCA with plans for medical management of the disease in her LAD  Assessment & Plan    CAD/NSTEMI: No chest pain today. Continue DAPT with ASA and Plavix for one year. Continue high intensity statin and beta blocker.   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 She converted spontaneously and did not require defibrillation. No events on tele over last 24 hours. Will continue amiodarone and Toprol.   Chronic combined systolic and diastolic heart failure: Volume control by HD. Not volume overloaded on exam today.   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 To OR today for L arm AVF vs graft and TDC placement. She remains on Lasix per Nephrology.   Anemia chronic disease versus acute on chronic anemia:  Hemoglobin improved from 7.6 to 9.4 after 1 unit PRBC transfusion. Stable today  Addendum 06/23/23 Verne Carrow 06/23/2023 12:39 PM

## 2023-06-22 NOTE — Progress Notes (Signed)
Dr. Orson Aloe, on-call for attending, paged regarding pt's oozing at new HD Cath site and previous holding of 1400 dose of SQ Heparin. Page promptly returned and MD endorsed to hold 2200 and 0600 doses of SQ Heparin. Pt updated and will inform Day RN during shift change.

## 2023-06-22 NOTE — Progress Notes (Addendum)
  Progress Note    06/22/2023 7:38 AM 5 Days Post-Op  Subjective:  no complaints   Vitals:   06/22/23 0112 06/22/23 0455  BP: (!) 130/55 (!) 116/52  Pulse: 65 64  Resp: 18 18  Temp: 98.4 F (36.9 C) 98.5 F (36.9 C)  SpO2: 96% 97%   Physical Exam: Lungs:  non labored Extremities:  palpable L radial pulse Neurologic: A&O  CBC    Component Value Date/Time   WBC 9.5 06/22/2023 0108   RBC 3.11 (L) 06/22/2023 0108   HGB 9.5 (L) 06/22/2023 0108   HGB 12.0 03/03/2014 1004   HCT 29.9 (L) 06/22/2023 0108   HCT 35.9 03/03/2014 1004   PLT 140 (L) 06/22/2023 0108   PLT 177 03/03/2014 1004   MCV 96.1 06/22/2023 0108   MCV 84 03/03/2014 1004   MCH 30.5 06/22/2023 0108   MCHC 31.8 06/22/2023 0108   RDW 15.3 06/22/2023 0108   RDW 15.3 (H) 03/03/2014 1004   LYMPHSABS 0.7 06/13/2023 0433   MONOABS 0.8 06/13/2023 0433   EOSABS 0.3 06/13/2023 0433   BASOSABS 0.1 06/13/2023 0433    BMET    Component Value Date/Time   NA 127 (L) 06/20/2023 0839   NA 136 03/03/2014 1004   K 3.8 06/20/2023 0839   K 4.4 03/03/2014 1004   CL 88 (L) 06/20/2023 0839   CL 106 03/03/2014 1004   CO2 25 06/20/2023 0839   CO2 25 03/03/2014 1004   GLUCOSE 105 (H) 06/20/2023 0839   GLUCOSE 212 (H) 03/03/2014 1004   BUN 33 (H) 06/20/2023 0839   BUN 44 (H) 03/03/2014 1004   CREATININE 4.07 (H) 06/20/2023 0839   CREATININE 1.86 (H) 03/03/2014 1004   CALCIUM 9.7 06/20/2023 0839   CALCIUM 9.5 03/03/2014 1004   GFRNONAA 10 (L) 06/20/2023 0839   GFRNONAA 26 (L) 03/03/2014 1004   GFRAA 23 (L) 02/18/2018 1424   GFRAA 31 (L) 03/03/2014 1004    INR    Component Value Date/Time   INR 1.3 (H) 06/12/2023 0413     Intake/Output Summary (Last 24 hours) at 06/22/2023 0738 Last data filed at 06/22/2023 0500 Gross per 24 hour  Intake 240 ml  Output 500 ml  Net -260 ml     Assessment/Plan:  82 y.o. female in need of dialysis access 5 Days Post-Op   Plan is for L arm AVF vs graft placement and TDC  placement today in OR Procedure was discussed in detail with the patient and all questions answered Continue npo Consent signed   Emilie Rutter, PA-C [facility] [phone] 06/22/2023 7:38 AM  VASCULAR STAFF ADDENDUM: I have independently interviewed and examined the patient. I agree with the above.  After discussing the risks and benefits of left arm Avf and TDC placement, Teruko elected to proceed.   Fara Olden, MD [facility] Phone Number: [phone] 06/22/2023 11:34 AM

## 2023-06-22 NOTE — Transfer of Care (Signed)
Immediate Anesthesia Transfer of Care Note  Patient: Cassandra Baker  Procedure(s) Performed: LEFT BRACHIOBASILIC ARTERIOVENOUS (AV) FISTULA CREATION (Left: Arm Upper) INSERTION OF TUNNELED  DIALYSIS CATHETER USING 19CM PALINDROME CATHETER (Chest)  Patient Location: PACU  Anesthesia Type:General  Level of Consciousness: awake, alert , and oriented  Airway & Oxygen Therapy: Patient Spontanous Breathing and Patient connected to face mask oxygen  Post-op Assessment: Report given to RN, Post -op Vital signs reviewed and stable, and Patient moving all extremities  Post vital signs: Reviewed and stable  Last Vitals:  Vitals Value Taken Time  BP 128/56 06/22/23 1430  Temp    Pulse 78 06/22/23 1431  Resp 14 06/22/23 1431  SpO2 97 % 06/22/23 1431  Vitals shown include unvalidated device data.  Last Pain:  Vitals:   06/22/23 1137  TempSrc:   PainSc: 0-No pain      Patients Stated Pain Goal: 3 (06/22/23 1137)  Complications: There were no known notable events for this encounter.

## 2023-06-22 NOTE — Progress Notes (Signed)
Patient ID: Cassandra Baker, female   DOB: 1941-08-19, 82 y.o.   MRN: [id number] S: s/p RIJ TDC placement and left BC AVF creation.  A little groggy.  No complaints. O:BP (!) 142/65   Pulse 64   Temp 98.1 F (36.7 C) (Oral)   Resp 16   Ht 5\' 4"  (1.626 m)   Wt 71.7 kg   LMP  (LMP Unknown)   SpO2 95%   BMI 27.12 kg/m   Intake/Output Summary (Last 24 hours) at 06/22/2023 1429 Last data filed at 06/22/2023 1404 Gross per 24 hour  Intake 400 ml  Output 500 ml  Net -100 ml   Intake/Output: I/O last 3 completed shifts: In: 240 [P.O.:240] Out: 500 [Urine:500]  Intake/Output this shift:  Total I/O In: 400 [I.V.:400] Out: -  Weight change: 0.268 kg Gen: NAD CVS: RRR Resp:CTA Abd: +BS, soft, NT/ND Ext: no edema, LUE AVF +T/B  Recent Labs  Lab 06/16/23 0421 06/16/23 1700 06/17/23 0536 06/18/23 0556 06/20/23 0839 06/22/23 1136  NA 126* 122* 130* 124*  128* 127* 127*  K 4.4 4.6 4.0 3.8  3.9 3.8 4.3  CL 91* 86* 89* 89*  88* 88* 91*  CO2 25 23 25 24  23 25   --   GLUCOSE 120* 146* 123* 88  91 105* 108*  BUN 59* 58* 26* 32*  34* 33* 37*  CREATININE 4.38* 4.71* 2.94* 3.67*  3.69* 4.07* 4.50*  ALBUMIN  --   --   --  2.5* 2.9*  --   CALCIUM 9.0 9.1 9.0 8.7*  9.0 9.7  --   PHOS  --  6.2*  --  5.8* 4.3  --    Liver Function Tests: Recent Labs  Lab 06/18/23 0556 06/20/23 0839  ALBUMIN 2.5* 2.9*   No results for input(s): "LIPASE", "AMYLASE" in the last 168 hours. No results for input(s): "AMMONIA" in the last 168 hours. CBC: Recent Labs  Lab 06/18/23 0556 06/19/23 0527 06/20/23 0047 06/21/23 0106 06/22/23 0108 06/22/23 1136  WBC 7.2 8.5 8.4 9.0 9.5  --   HGB 7.6* 9.2* 9.4* 9.4* 9.5* 11.9*  HCT 23.2* 28.9* 29.0* 30.0* 29.9* 35.0*  MCV 96.3 92.0 93.9 95.5 96.1  --   PLT 113* 121* 127* 130* 140*  --    Cardiac Enzymes: No results for input(s): "CKTOTAL", "CKMB", "CKMBINDEX", "TROPONINI" in the last 168 hours. CBG: Recent Labs  Lab 06/21/23 1637 06/21/23 2105  06/22/23 0623 06/22/23 0633 06/22/23 1123  GLUCAP 117* 202* 123* 124* 108*    Iron Studies: No results for input(s): "IRON", "TIBC", "TRANSFERRIN", "FERRITIN" in the last 72 hours. Studies/Results: HYBRID OR IMAGING ([facility] ONLY)  Result Date: 06/22/2023 There is no interpretation for this exam.  This order is for images obtained during a surgical procedure.  Please See "Surgeries" Tab for more information regarding the procedure.    [MAR Hold] amiodarone  200 mg Oral BID   [MAR Hold] aspirin EC  81 mg Oral Daily   [MAR Hold] atorvastatin  80 mg Oral Daily   [MAR Hold] calcium acetate  667 mg Oral TID WC   [MAR Hold] Chlorhexidine Gluconate Cloth  6 each Topical Q0600   [MAR Hold] clopidogrel  75 mg Oral Daily   [MAR Hold] darbepoetin (ARANESP) injection - DIALYSIS  60 mcg Subcutaneous Q Thu-1800   [MAR Hold] feeding supplement (NEPRO CARB STEADY)  237 mL Oral BID BM   [MAR Hold] FLUoxetine  10 mg Oral Once per day on Mon Wed Fri   [  MAR Hold] furosemide  80 mg Oral BID   [MAR Hold] heparin  5,000 Units Subcutaneous Q8H   [MAR Hold] insulin aspart  0-15 Units Subcutaneous TID WC   [MAR Hold] levothyroxine  75 mcg Oral QAC breakfast   [MAR Hold] metoprolol succinate  25 mg Oral Daily   [MAR Hold] multivitamin  1 tablet Oral QHS   [MAR Hold] sodium bicarbonate  650 mg Oral BID   [MAR Hold] sodium chloride flush  3 mL Intravenous Q12H    BMET    Component Value Date/Time   NA 127 (L) 06/22/2023 1136   NA 136 03/03/2014 1004   K 4.3 06/22/2023 1136   K 4.4 03/03/2014 1004   CL 91 (L) 06/22/2023 1136   CL 106 03/03/2014 1004   CO2 25 06/20/2023 0839   CO2 25 03/03/2014 1004   GLUCOSE 108 (H) 06/22/2023 1136   GLUCOSE 212 (H) 03/03/2014 1004   BUN 37 (H) 06/22/2023 1136   BUN 44 (H) 03/03/2014 1004   CREATININE 4.50 (H) 06/22/2023 1136   CREATININE 1.86 (H) 03/03/2014 1004   CALCIUM 9.7 06/20/2023 0839   CALCIUM 9.5 03/03/2014 1004   GFRNONAA 10 (L) 06/20/2023 0839   GFRNONAA  26 (L) 03/03/2014 1004   GFRAA 23 (L) 02/18/2018 1424   GFRAA 31 (L) 03/03/2014 1004   CBC    Component Value Date/Time   WBC 9.5 06/22/2023 0108   RBC 3.11 (L) 06/22/2023 0108   HGB 11.9 (L) 06/22/2023 1136   HGB 12.0 03/03/2014 1004   HCT 35.0 (L) 06/22/2023 1136   HCT 35.9 03/03/2014 1004   PLT 140 (L) 06/22/2023 0108   PLT 177 03/03/2014 1004   MCV 96.1 06/22/2023 0108   MCV 84 03/03/2014 1004   MCH 30.5 06/22/2023 0108   MCHC 31.8 06/22/2023 0108   RDW 15.3 06/22/2023 0108   RDW 15.3 (H) 03/03/2014 1004   LYMPHSABS 0.7 06/13/2023 0433   MONOABS 0.8 06/13/2023 0433   EOSABS 0.3 06/13/2023 0433   BASOSABS 0.1 06/13/2023 0433    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 Allena Katz. PMH: CAD, NSTEMI, DMT2, HTN, hypothyroidism.    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 Had 1st HD 6/21 and 2nd HD 6/24 (shortened to 1 hr by pt) at [facility]. Had 3rd HD 6/25 and the 4th HD session on 6/27 here.  Next HD 06/23/2023 Polymorphic VT-seen by cards. Likely mediated by ischemia. Cardiology following. 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 G1DD. Appears euvolemic. Optimize volume with HD.  No permanent access: VVS consulted for permanent access. S/p RIJ TDC and left BC AVF creation today by Dr. Karin Lieu.  Will discontinue femoral temporary cath.  NSTEMI/ CAD - w/ heart cath showing 3V CAD done at [facility]. Underwent PCI to RCA on 06/18/2023. Cardiology following.   HTN/ volume -BP elevated today. No evidence of volume overload by exam. UF as tolerated.    Hyponatremia - changing to renal diet w/ fluid restriction 1200 cc/d. Na+ 127 today.  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 Tsat done 24%, ferritin 152. Started darbe 60 mcg sq weekly while here.  MBD ckd - CCa in range. Getting phoslo at 1 ac tid. F/u phos improving w/ hd and binders.  Get pth.  A/C systolic CHF - no vol overload. Echo showed LVEF 30-35%.  DM2 Hypothyroid Anxiety/ depression EColi UTI  Irena Cords, MD [facility]

## 2023-06-22 NOTE — Op Note (Signed)
    NAME: Cassandra Baker    MRN: [id number] DOB: 07/16/1941    DATE OF OPERATION: 06/22/2023  PREOP DIAGNOSIS:    End-stage renal disease requiring dialysis  POSTOP DIAGNOSIS:    Same  PROCEDURE:    Tunneled dialysis catheter placement in the right internal jugular vein-19 cm palindrome Left arm first stage brachiobasilic fistula  SURGEON: Victorino Sparrow  ASSIST: Emilie Rutter, PA  ANESTHESIA: General  EBL: 20 mL  INDICATIONS:    Cassandra Baker is a 82 y.o. female with chronic kidney disease, now end-stage requiring dialysis.  Prior left arm brachiocephalic fistula has thrombosed.  FINDINGS:   5 mm basilic vein, 5 mm brachial artery  TECHNIQUE:   Using ultrasound guidance the right internal jugular vein was accessed with micropuncture technique.  Through the micropuncture sheath a floppy J-wire was advanced into the superior vena cava.  A small incision was made around the skin access point.  A counterincision was made in the chest under the clavicle.  A 23 cm tunneled dialysis catheter was then tunneled under the skin, over the clavicle into the incision in the neck.  The access point was serially dilated under direct fluoroscopic guidance.  A peel-away sheath was introduced into the superior vena cava under fluoroscopic guidance.  The tunneling device was removed and the catheter fed through the peel-away sheath into the superior vena cava.  The peel-away sheath was removed and the catheter gently pulled back.  Adequate position was confirmed with x-ray.  The catheter was tested and found to flush and draw back well.  Catheter was heparin locked.  Caps were applied.  Catheter was sutured to the skin.  The neck incision was closed with 4-0 Monocryl.  Next, I moved to left arm fistula creation. The left arm was prepped and draped in a standard fashion. IV antibiotics were prior to incision. A timeout was performed.   The basilic vein in the left arm was identified using  ultrasound and appeared of sufficient size. A transverse incision was made above the elbow creese in the antecubital fossa. The basilic vein was identified and isolated for 4 cm in length.  The bicipital aponeurosis was partially released and the brachial artery freed from its paired brachial veins and secured with a vessel loop. The patient was heparinized. The basilic vein was marked and ligated distally with 2-0 silk, then flushed with heparinized saline. Vascular clamps were placed proximally and distally on the brachial artery and a 5 mm arteriotomy  was created on the brachial artery. This was flushed with heparin saline. The vein was juxtaposed to the artery and an anastomosis was created using 6-0 Prolene.   Prior to completing the anastomsis, the vessels were flushed and the suture line was tied down. There was an excellent thrill in the basilic vein from the anastomosis into the upper arm. The patient had a 1+ radial pulse. The incision was irrigated and hemostasis acheived. The deeper tissue was closed with 3-0 Vicryl and the skin closed with 4-0 Monocryl.    Dermabond was applied the incisions. He was transferred to PACU in stable condition.      Ladonna Snide, MD [facility] DATE OF DICTATION:   06/22/2023

## 2023-06-22 NOTE — Anesthesia Procedure Notes (Signed)
Procedure Name: Intubation Date/Time: 06/22/2023 12:44 PM  Performed by: Lewie Loron, MDPre-anesthesia Checklist: Patient identified, Emergency Drugs available, Suction available and Patient being monitored Patient Re-evaluated:Patient Re-evaluated prior to induction Oxygen Delivery Method: Circle system utilized Preoxygenation: Pre-oxygenation with 100% oxygen Induction Type: IV induction Ventilation: Mask ventilation without difficulty Laryngoscope Size: Mac and 3 Grade View: Grade III Tube type: Oral Tube size: 7.0 mm Number of attempts: 1 Airway Equipment and Method: Stylet and Oral airway Placement Confirmation: ETT inserted through vocal cords under direct vision, positive ETCO2 and breath sounds checked- equal and bilateral Secured at: 21 cm Tube secured with: Tape Dental Injury: Teeth and Oropharynx as per pre-operative assessment

## 2023-06-23 ENCOUNTER — Encounter (HOSPITAL_COMMUNITY): Payer: Self-pay | Admitting: Vascular Surgery

## 2023-06-23 ENCOUNTER — Encounter: Payer: BLUE CROSS/BLUE SHIELD | Admitting: Family

## 2023-06-23 LAB — BASIC METABOLIC PANEL
Anion gap: 11 (ref 5–15)
BUN: 40 mg/dL — ABNORMAL HIGH (ref 8–23)
CO2: 25 mmol/L (ref 22–32)
Calcium: 9.8 mg/dL (ref 8.9–10.3)
Chloride: 89 mmol/L — ABNORMAL LOW (ref 98–111)
Creatinine, Ser: 5.03 mg/dL — ABNORMAL HIGH (ref 0.44–1.00)
GFR, Estimated: 8 mL/min — ABNORMAL LOW (ref >60)
Glucose, Bld: 185 mg/dL — ABNORMAL HIGH (ref 70–99)
Potassium: 4.9 mmol/L (ref 3.5–5.1)
Sodium: 125 mmol/L — ABNORMAL LOW (ref 135–145)

## 2023-06-23 LAB — GLUCOSE, CAPILLARY
Glucose-Capillary: 139 mg/dL — ABNORMAL HIGH (ref 70–99)
Glucose-Capillary: 122 mg/dL — ABNORMAL HIGH (ref 70–99)
Glucose-Capillary: 137 mg/dL — ABNORMAL HIGH (ref 70–99)
Glucose-Capillary: 148 mg/dL — ABNORMAL HIGH (ref 70–99)

## 2023-06-23 LAB — HEPATITIS B SURFACE ANTIBODY, QUANTITATIVE: Hep B S AB Quant (Post): <3.5 m[IU]/mL — ABNORMAL LOW

## 2023-06-23 LAB — CBC
HCT: 31.2 % — ABNORMAL LOW (ref 36.0–46.0)
Hemoglobin: 9.9 g/dL — ABNORMAL LOW (ref 12.0–15.0)
MCH: 30.4 pg (ref 26.0–34.0)
MCHC: 31.7 g/dL (ref 30.0–36.0)
MCV: 95.7 fL (ref 80.0–100.0)
Platelets: 170 10*3/uL (ref 150–400)
RBC: 3.26 MIL/uL — ABNORMAL LOW (ref 3.87–5.11)
RDW: 15.3 % (ref 11.5–15.5)
WBC: 12.9 10*3/uL — ABNORMAL HIGH (ref 4.0–10.5)
nRBC: 0 % (ref 0.0–0.2)

## 2023-06-23 MED ORDER — ANTICOAGULANT SODIUM CITRATE 4% (200MG/5ML) IV SOLN: 5.0000 mL | Status: DC | PRN

## 2023-06-23 MED ORDER — HEPARIN SODIUM (PORCINE) 1000 UNIT/ML DIALYSIS: 1000.0000 [IU] | INTRAMUSCULAR | Status: DC | PRN

## 2023-06-23 MED ORDER — ALTEPLASE 2 MG IJ SOLR: 2.0000 mg | Freq: Once | INTRAMUSCULAR | Status: DC | PRN

## 2023-06-23 MED ORDER — HEPARIN SODIUM (PORCINE) 1000 UNIT/ML IJ SOLN
INTRAMUSCULAR | Status: AC
  Filled 2023-06-23: qty 4

## 2023-06-23 MED ORDER — ALBUMIN HUMAN 25 % IV SOLN
INTRAVENOUS | Status: AC
  Filled 2023-06-23: qty 100

## 2023-06-23 MED ORDER — HEPARIN SODIUM (PORCINE) 1000 UNIT/ML DIALYSIS: 2500.0000 [IU] | Freq: Once | INTRAMUSCULAR | Status: DC

## 2023-06-23 NOTE — Progress Notes (Addendum)
   Patient Name: Cassandra Baker Date of Encounter: 06/23/2023 [facility] Cardiologist: Julien Nordmann, MD   Interval Summary  .    Patient seen during HD. Denies chest pain, palpitations today. Left arm is not painful, but feels a little "funny" after left UE first stage basilic vein fistula creation yesterday   Vital Signs .    Vitals:   06/23/23 0900 06/23/23 0930 06/23/23 1001 06/23/23 1030  BP: (!) 99/44 (!) 125/55 (!) 113/91 (!) 121/50  Pulse: 73 77 78 76  Resp: (!) 9 14 16 16   Temp:      TempSrc:      SpO2: 100% 98% 98% 97%  Weight:      Height:        Intake/Output Summary (Last 24 hours) at 06/23/2023 1051 Last data filed at 06/23/2023 0500 Gross per 24 hour  Intake 880 ml  Output 725 ml  Net 155 ml      06/23/2023    7:45 AM 06/23/2023    5:00 AM 06/22/2023   11:18 AM  Last 3 Weights  Weight (lbs) 157 lb 10.1 oz 159 lb 9.6 oz 158 lb  Weight (kg) 71.5 kg 72.394 kg 71.668 kg      Telemetry/ECG    NSR - Personally Reviewed  Physical Exam .   GEN: No acute distress. Undergoing HD   Neck: No JVD Cardiac: RRR, no murmurs, rubs, or gallops.  Respiratory: Clear to auscultation bilaterally. Normal work of breathing on room air  GI: Soft, nontender, non-distended  MS: No edema in BLE  Assessment & Plan .     NSTEMI  - Patient now s/p PCI to the RCA. Plans for medical management of the disease in her LAD  - Patient denies chest pain today or overnight  - Continue DAPT with ASA, plavix for 1 year  - Continue lipitor 80 mg daily - Continue metoprolol succinate 25 mg daily   Polymorphic Vtach  - Patient went into polymorphic VT for 10 seconds prior to RCA PCI on 06/17/23. She converted spontaneously and did not require defibrillation  - Likely due to ischemia  - Continue PO amiodarone 200 mg BID and metoprolol succinate 25 mg daily   Chronic combined systolic and diastolic heart failure  - Volume controlled by HD. Also on Lasix 80 mg BID per nephrology    ESRD  - Now on HD- underwent first stage basilic vein fistula creation buy Dr. Karin Lieu yesterday - Patient is deciding if she is going to be discharged home vs staying with family- once that decision is made, outpatient HD can be arranged and patient can be discharged   Anemia of chronic disease vs acute on chronic anemia  - Hemoglobin improved from 7.6>9.2 after 1 unit PRBCs  - Hemoglobin stable at 9.9 today   For questions or updates, please contact [facility] Please consult [email or web address] for contact info under        Signed, Jonita Albee, PA-C   I have personally seen and examined this patient. I agree with the assessment and plan as outlined above.  Doing well today. Continue current therapy. Discharge planning in progress.   Verne Carrow 06/23/2023 12:41 PM

## 2023-06-23 NOTE — Progress Notes (Signed)
VASCULAR SURGERY:  Called because of bleeding around her catheter site.  The dressing around the cath site was soaked in blood.  I remove this.  There was really no active bleeding.  I reapplied a dressing.  This may have potentially been related to her heparin given during dialysis.  Cari Caraway, MD 5:19 PM

## 2023-06-23 NOTE — Progress Notes (Signed)
Met with pt at bedside while receiving HD. Pt states she plans to speak with son today in regards to d/c plans (pt to return to her home vs staying with family). Explained to pt that navigator cannot finalize out-pt HD clinic until it is known where pt will go at d/c. Pt voices understanding. Will f/u with pt tomorrow morning unless notified sooner of pt's d/c plan. Will assist as needed.   Olivia Canter Renal Navigator [phone]

## 2023-06-23 NOTE — Anesthesia Postprocedure Evaluation (Signed)
Anesthesia Post Note  Patient: [name]  Procedure(s) Performed: LEFT BRACHIOBASILIC ARTERIOVENOUS (AV) FISTULA CREATION (Left: Arm Upper) INSERTION OF TUNNELED  DIALYSIS CATHETER USING 19CM PALINDROME CATHETER (Chest)     Patient location during evaluation: PACU Anesthesia Type: General Level of consciousness: sedated and patient cooperative Pain management: pain level controlled Vital Signs Assessment: post-procedure vital signs reviewed and stable Respiratory status: spontaneous breathing Cardiovascular status: stable Anesthetic complications: no  There were no known notable events for this encounter.  Last Vitals:  Vitals:   06/23/23 0745 06/23/23 0800  BP: (!) 105/47 (!) 103/44  Pulse: 61 (!) 59  Resp: 15 15  Temp: 36.7 C   SpO2: 97% 97%    Last Pain:  Vitals:   06/23/23 0729  TempSrc:   PainSc: 0-No pain                 Cassandra Baker

## 2023-06-23 NOTE — Progress Notes (Signed)
         Left UE incision healing well, no steal symptoms Right TDC in place Plan for HD today   S/P left UE first stage basilic vein fistula creation by DR Karin Lieu F/U in 5-6 weeks with duplex and possible planning of second stage surgery.     Mosetta Pigeon  PA-C [phone]

## 2023-06-23 NOTE — Progress Notes (Addendum)
Patient's dialysis access line in the right internal jugular still oozing small amount of blood, even after HD RN application of surgi foam and occlusive dressing earlier after dialysis. Patient cleaned up and clean gown provided. Pressure dressing applied. Attending MD notified, heparin discontinued. Dr Edilia Bo in vascular surgery notified, and states he will come by and see the patient. Patient denies discomfort, concerned about the bleeding. Elnita Maxwell, RN  [phone]: Dr Edilia Bo came to see the patient at bedside, and addressed the bleeding internal jugular HD catheter issue. RN will continue to monitor patient. Elnita Maxwell, RN

## 2023-06-23 NOTE — Progress Notes (Addendum)
Received patient in bed.Awake,alert and oriented x 4.She signed her treatment consent.  Access used:Right internal jugular HD catheter,that was placed yesterday.Catheter insertion site was still oozing with blood ,esply during her last hour of treatment. PRN heparin bolus was not given. Occlusive dressing changed done today with surgi foam around the catheter insertion site.Send back to her room with an ice pack on top of it.  Medicines given.: Albumin 25 g. =120cc                              Ferric Gluconate 125 mg=110 cc  Duration of treatment 3.5 hours.  Fluid removed : Achieved prescribed UF goal of 2 liters plus 230 cc to offset the fluid volume of Albumin and Ferric Gluconate.  Hemo comment:Patient started with a soft blood pressure .Tolerated treatment well.   Hand off to the patient's nurse.

## 2023-06-23 NOTE — Procedures (Signed)
I was present at this dialysis session. I have reviewed the session itself and made appropriate changes.   Vital signs in last 24 hours:  Temp:  [97.3 F (36.3 C)-98.1 F (36.7 C)] 98.1 F (36.7 C) (07/02 0745) Pulse Rate:  [59-81] 73 (07/02 0900) Resp:  [9-18] 9 (07/02 0900) BP: (94-142)/(40-65) 99/44 (07/02 0900) SpO2:  [93 %-100 %] 100 % (07/02 0900) Weight:  [71.5 kg-72.4 kg] 71.5 kg (07/02 0745) Weight change: -0 kg Filed Weights   06/22/23 1118 06/23/23 0500 06/23/23 0745  Weight: 71.7 kg 72.4 kg 71.5 kg    Recent Labs  Lab 06/20/23 0839 06/22/23 1136 06/23/23 0043  NA 127*   < > 125*  K 3.8   < > 4.9  CL 88*   < > 89*  CO2 25  --  25  GLUCOSE 105*   < > 185*  BUN 33*   < > 40*  CREATININE 4.07*   < > 5.03*  CALCIUM 9.7  --  9.8  PHOS 4.3  --   --    < > = values in this interval not displayed.    Recent Labs  Lab 06/21/23 0106 06/22/23 0108 06/22/23 1136 06/23/23 0043  WBC 9.0 9.5  --  12.9*  HGB 9.4* 9.5* 11.9* 9.9*  HCT 30.0* 29.9* 35.0* 31.2*  MCV 95.5 96.1  --  95.7  PLT 130* 140*  --  170    Scheduled Meds:  amiodarone  200 mg Oral BID   aspirin EC  81 mg Oral Daily   atorvastatin  80 mg Oral Daily   calcium acetate  667 mg Oral TID WC   Chlorhexidine Gluconate Cloth  6 each Topical Q0600   clopidogrel  75 mg Oral Daily   darbepoetin (ARANESP) injection - DIALYSIS  60 mcg Subcutaneous Q Thu-1800   feeding supplement (NEPRO CARB STEADY)  237 mL Oral BID BM   FLUoxetine  10 mg Oral Once per day on Mon Wed Fri   furosemide  80 mg Oral BID   heparin  2,500 Units Dialysis Once in dialysis   heparin  5,000 Units Subcutaneous Q8H   insulin aspart  0-15 Units Subcutaneous TID WC   levothyroxine  75 mcg Oral QAC breakfast   metoprolol succinate  25 mg Oral Daily   multivitamin  1 tablet Oral QHS   sodium bicarbonate  650 mg Oral BID   Continuous Infusions:  sodium chloride     albumin human 25 g (06/23/23 0814)   albumin human      anticoagulant sodium citrate     anticoagulant sodium citrate     ferric gluconate (FERRLECIT) IVPB Stopped (06/20/23 1216)   PRN Meds:.sodium chloride, acetaminophen, albumin human, albumin human, alteplase, alteplase, anticoagulant sodium citrate, anticoagulant sodium citrate, heparin, heparin, methocarbamol, nitroGLYCERIN, ondansetron (ZOFRAN) IV, mouth rinse, oxyCODONE   Irena Cords,  MD 06/23/2023, 9:05 AM

## 2023-06-23 NOTE — Discharge Instructions (Signed)
° °  [facility] ° °Discharge Instructions ° °AV Fistula or Graft Surgery for Dialysis Access ° °Please refer to the following instructions for your post-procedure care. Your surgeon or physician assistant will discuss any changes with you. ° °Activity ° °You may drive the day following your surgery, if you are comfortable and no longer taking prescription pain medication. Resume full activity as the soreness in your incision resolves. ° °Bathing/Showering ° °You may shower after you go home. Keep your incision dry for 48 hours. Do not soak in a bathtub, hot tub, or swim until the incision heals completely. You may not shower if you have a hemodialysis catheter. ° °Incision Care ° °Clean your incision with mild soap and water after 48 hours. Pat the area dry with a clean towel. You do not need a bandage unless otherwise instructed. Do not apply any ointments or creams to your incision. You may have skin glue on your incision. Do not peel it off. It will come off on its own in about one week. Your arm may swell a bit after surgery. To reduce swelling use pillows to elevate your arm so it is above your heart. Your doctor will tell you if you need to lightly wrap your arm with an ACE bandage. ° °Diet ° °Resume your normal diet. There are not special food restrictions following this procedure. In order to heal from your surgery, it is CRITICAL to get adequate nutrition. Your body requires vitamins, minerals, and protein. Vegetables are the best source of vitamins and minerals. Vegetables also provide the perfect balance of protein. Processed food has little nutritional value, so try to avoid this. ° °Medications ° °Resume taking all of your medications. If your incision is causing pain, you may take over-the counter pain relievers such as acetaminophen (Tylenol). If you were prescribed a stronger pain medication, please be aware these medications can cause nausea and constipation. Prevent  nausea by taking the medication with a snack or meal. Avoid constipation by drinking plenty of fluids and eating foods with high amount of fiber, such as fruits, vegetables, and grains. Do not take Tylenol if you are taking prescription pain medications. ° ° ° ° °Follow up °Your surgeon may want to see you in the office following your access surgery. If so, this will be arranged at the time of your surgery. ° °Please call us immediately for any of the following conditions: ° °Increased pain, redness, drainage (pus) from your incision site °Fever of 101 degrees or higher °Severe or worsening pain at your incision site °Hand pain or numbness. ° °Reduce your risk of vascular disease: ° °Stop smoking. If you would like help, call [facility] at 1-800-QUIT-NOW (1-800-784-8669) or [place] at 336-586-4000 ° °Manage your cholesterol °Maintain a desired weight °Control your diabetes °Keep your blood pressure down ° °Dialysis ° °It will take several weeks to several months for your new dialysis access to be ready for use. Your surgeon will determine when it is OK to use it. Your nephrologist will continue to direct your dialysis. You can continue to use your Permcath until your new access is ready for use. ° °If you have any questions, please call the office at [phone]. ° °

## 2023-06-23 NOTE — Discharge Summary (Incomplete)
Discharge Summary    Patient ID: Cassandra Baker MRN: [id number]; DOB: [birth year]  Admit date: 06/16/2023 Discharge date: 06/28/2023  PCP:  Renford Dills, MD   [facility] Providers Cardiologist:  Julien Nordmann, MD     Discharge Diagnoses    Principal Problem:   Ventricular tachycardia, sustained [facility]) Active Problems:   Acute on chronic combined systolic and diastolic CHF (congestive heart failure) (HCC)   Hypothyroidism   Type 2 diabetes mellitus (HCC)   ESRD (end stage renal disease) (HCC)   CAD (coronary artery disease)   Essential hypertension    Diagnostic Studies/Procedures    Right/Left Heart Catheterization 06/15/23 1.  Severe three-vessel coronary artery disease.  The culprit for myocardial infarction is likely subtotal occlusion of the right coronary artery which is heavily calcified and diffusely diseased in the proximal/mid segment.  In addition, there is complex bifurcation disease at the right PDA and right posterior AV groove artery. 2.  Mildly with severely reduced LV systolic function with an EF of 30% with severe inferior and apical hypokinesis. 3.  Right heart catheterization showed mildly elevated filling pressures, mild pulmonary hypertension and normal cardiac output.  No significant V waves noted on wedge tracing.   Recommendations: Difficult revascularization options.  The patient has surgical disease but she might not be a candidate for CABG.  The RCA is the culprit but it is diffusely diseased and heavily calcified.  Will discuss with patient and family and consider transfer to [facility] for evaluation.  If we decide to PCI, it is preferable to transfuse her to get her hemoglobin above 9 and load with an antiplatelet medication.  Diagnostic Dominance: Right   Peripheral Intravascular Lithotripsy and coronary stent intervention 06/17/23 Moderately to severely elevated left ventricular end-diastolic pressure at 32 mmHg. 2.  Successful complex intravascular lithotripsy and 2 overlapped drug-eluting stent placement to the mid and proximal right coronary artery.  Very difficult procedure overall due to heavy calcifications and difficulty crossing the stenosis in the mid right coronary artery.  The distal RCA bifurcation disease appeared better than before likely due to improved upstream flow and thus I elected not to treat. 3.  The patient did go into polymorphic ventricular tachycardia that lasted for about 10 seconds before starting the procedure.  She converted and did not require defibrillation. 4.  Difficulty sedating the patient with continuous involuntary movements of her lower extremities throughout the case.   Recommendations: Dual antiplatelet therapy for at least 12 months. I discontinued heparin drip. I switch amiodarone drip to oral. Recommend treating the LAD disease medically.  Diagnostic Dominance: Right  Intervention     _____________   History of Present Illness     Cassandra Baker is a 82 y.o. female with a past medical history of CAD with recent NSTEMI that was managed medically, chronic combined systolic and diastolic heart failure, ESRD, anemia of chronic disease, type 2 DM, HTN, hypothyroidism, depression, asthma, OA. Patient was admitted in June with increasing exertional dyspnea over the preceding year with occasional vague chest discomfort. She was admitted with an NSTEMI, acute combined CHF complicated by CKD. Echocardiogram 6/13 showed EF 30-35% with global hypokinesis with severe hypokinesis of the inferior/posterior/septal wall, Grade I DD, normal RVSF, moderate MR with moderate mitral annular calcification, and an estimated right atrial pressure of . Due to CKD, cath was not pursued immediately and patient instead underwent nuclear stress test that showed EF 31%. Overall, the scan was high risk as there was  evidence of a large infarct. Considered LHC, but this was deferred due to  poor renal function. She was treated with 48 hours of IV heparin. Escalation of GDMT was limited by co morbidities, and she ultimately left AMA on 6/16.   Patient returned to [facility] 06/11/23 complaining of increasing lower extremity swelling with significant fatigue. She denied chest pain, palpitations, or dyspnea. BNP remained elevated to 2338. She underwent temporary HD catheter placement with initiation of dialysis on 06/12/23. On the morning of 06/13/23, patient developed torsades de pointes that lasted 13.5 seconds followed by ventricular bigeminy. During the episode, patient reported dizziness and blacking out. She underwent LHC on 06/15/23 that showed mildly elevated filling pressures, mild pulmonary hypertension, and normal cardiac output with no hemodynamic evidence of significant mitral regurgitation. Left heart catheterization showed severe 3V CAD, and the culprit was a subtotal occlusion of the RCA which is severely calcified in the mid-segment as well as complex bifurcation disease at the PDA/posterior AV groove. She was started on IV heparin. After her cath, she continued to have frequent PVCs and NSVT on telemetry. She was started on amiodarone infusion. Decision was made to transfer patient to [facility] for CABG evaluation.   Hospital Course     Consultants: Nephrology, CT surgery,    Patient was transferred to [facility] on 6/25 for CABG evaluation. Patient arrived on IV heparin and IV amiodarone. She was evaluated by CT surgery and was not a candidate for CABG. Dr. Okey Dupre discussed options with family including multivessel PCI that may require lithotripsy/arterectomy vs palliative medical therapy. Family decided to pursue PCI. She was seen by Nephrology on 6/25 and underwent HD that evening. Received 1 unit of PRBCs during HD, and hemoglobin improved from 7.6 to 9.2. Overnight, patient was hemodynamically stable, but did have a lot of confusion and agitation. She was treated with  benzodiazepines with poor response.   On 6/26, patient underwent successful complex intravascular lithotripsy and had 2 overlapping drug-eluting stents placed to the mid-proximal right coronary artery. The procedure was very difficult overall due to heavy calcifications and difficulty crossing the stenosis in the mid right coronary artery. Recommended treating the LAD disease medically. She was treated with ASA, plavix, lipitor. The patient did go into polymorphic ventricular tachycardia that lasted for about 10 seconds before starting the procedure. She converted spontaneously and did not require defibrillation. Later on 6/26, patient was transitioned from IV amiodarone to PO amiodarone and she remained on metoprolol succinate. She underwent HD on 6/27.   On 6/28, patient was transferred out of the ICU and to a telemetry bed. IR was consulted for image guided tunneled HD cath. Vascular surgery was also consulted for permanent dialysis access. She was taken to the OR on 7/1 for tunneled dialysis catheter placement in the right internal jugular vein and left arm first stage brachiobasilic fistula creation. She was seen by vascular surgery on 7/2, who reported that her left UE incision was healing well. Patient underwent HD on 7/2.   Patient was evaluated by PT and OT on 06/26/2023 who recommended that if the patient discharged home, she will require assistance with cooking, housework and assistance with transportation.  Social worker has helped to set up outpatient dialysis.  Patient has been accepted by [facility] in [facility] location with dialysis schedule Monday Wednesday Friday starting this coming week.  Prior to discharge, patient underwent hemodialysis on 06/27/2023. She completed dialysis around 10PM due to delay and ended up staying until  06/28/2023. She was seen in the morning of 7/7 at which time, she was doing well. She is deemed stable for DC from cardiac perspective.        Did  the patient have an acute coronary syndrome (MI, NSTEMI, STEMI, etc) this admission?:  Yes                               AHA/ACC Clinical Performance & Quality Measures: Aspirin prescribed? - Yes ADP Receptor Inhibitor (Plavix/Clopidogrel, Brilinta/Ticagrelor or Effient/Prasugrel) prescribed (includes medically managed patients)? - Yes Beta Blocker prescribed? - Yes High Intensity Statin (Lipitor 40-80mg  or Crestor 20-40mg ) prescribed? - Yes EF assessed during THIS hospitalization? - No - assessed right prior to admission For EF <40%, was ACEI/ARB prescribed? - No - Reason:  ESRD For EF <40%, Aldosterone Antagonist (Spironolactone or Eplerenone) prescribed? - No - Reason:  BP Cardiac Rehab Phase II ordered (including medically managed patients)? - Yes       The patient will be scheduled for a TOC follow up appointment in 7-14 days.  A message has been sent to the [facility] and Scheduling Pool at the office where the patient should be seen for follow up.  _____________  Discharge Vitals Blood pressure (!) 100/49, pulse 76, temperature 98.8 F (37.1 C), temperature source Oral, resp. rate 16, height 5\' 4"  (1.626 m), weight 71.2 kg, SpO2 96 %.  Filed Weights   06/26/23 0405 06/27/23 0511 06/28/23 0449  Weight: 72.6 kg 73.9 kg 71.2 kg   Physical Exam   GEN: No acute distress.   Neck: No JVD Cardiac: RRR, no murmurs, rubs, or gallops.  Respiratory: Clear to auscultation bilaterally. GI: Soft, nontender, non-distended  MS: No edema; No deformity. Neuro:  Nonfocal  Psych: Normal affect   Labs & Radiologic Studies    CBC Recent Labs    06/25/23 1852 06/27/23 0930  WBC  --  8.5  HGB 10.3* 10.1*  HCT 32.5* 31.3*  MCV  --  96.6  PLT  --  127*   Basic Metabolic Panel Recent Labs    19/14/78 0930  NA 121*  K 4.3  CL 84*  CO2 25  GLUCOSE 196*  BUN 43*  CREATININE 4.07*  CALCIUM 9.7  PHOS 4.3   Liver Function Tests Recent Labs    06/27/23 0930  ALBUMIN 3.1*   No  results for input(s): "LIPASE", "AMYLASE" in the last 72 hours. High Sensitivity Troponin:   Recent Labs  Lab 06/03/23 2353 06/04/23 0145 06/05/23 1421 06/15/23 2237 06/16/23 0013  TROPONINIHS 986* 998* 993* 180* 209*    BNP Invalid input(s): "POCBNP" D-Dimer No results for input(s): "DDIMER" in the last 72 hours. Hemoglobin A1C No results for input(s): "HGBA1C" in the last 72 hours. Fasting Lipid Panel No results for input(s): "CHOL", "HDL", "LDLCALC", "TRIG", "CHOLHDL", "LDLDIRECT" in the last 72 hours. Thyroid Function Tests No results for input(s): "TSH", "T4TOTAL", "T3FREE", "THYROIDAB" in the last 72 hours.  Invalid input(s): "FREET3" _____________  DG CHEST PORT 1 VIEW  Result Date: 06/22/2023 CLINICAL DATA:  Dialysis catheter EXAM: PORTABLE CHEST 1 VIEW COMPARISON:  06/18/2023, 06/11/2023 FINDINGS: Right-sided central venous catheter tip near the cavoatrial region. Cardiomegaly with bilateral effusions, vascular congestion and mild interstitial edema. Aortic atherosclerosis. No pneumothorax IMPRESSION: 1. Right-sided central venous catheter tip near the cavoatrial region. No pneumothorax. 2. Cardiomegaly with bilateral effusions, vascular congestion and mild interstitial edema. Electronically Signed   By: Jasmine Pang  M.D.   On: 06/22/2023 18:37   HYBRID OR IMAGING ([facility] ONLY)  Result Date: 06/22/2023 There is no interpretation for this exam.  This order is for images obtained during a surgical procedure.  Please See "Surgeries" Tab for more information regarding the procedure.   VAS [place] UPPER EXT VEIN MAPPING (PRE-OP AVF)  Result Date: 06/19/2023 UPPER EXTREMITY VEIN MAPPING Patient Name:  Cassandra Baker  Date of Exam:   06/19/2023 Medical Rec #: [id number]       Accession #:    [id number] Date of Birth: 07-23-1941       Patient Gender: F Patient Age:   44 years Exam Location:  [facility] Procedure:      VAS [place] UPPER EXT VEIN MAPPING (PRE-OP AVF) Referring Phys: [facility] --------------------------------------------------------------------------------  Indications: Pre-access. History: History of failed left brachio-cephalic AVF.  Comparison Study: No recent prior. Vein mapping performed 2020 for prior access. Performing Technologist: Jean Rosenthal RDMS, RVT  Examination Guidelines: A complete evaluation includes B-mode imaging, spectral Doppler, color Doppler, and power Doppler as needed of all accessible portions of each vessel. Bilateral testing is considered an integral part of a complete examination. Limited examinations for reoccurring indications may be performed as noted. +-----------------+-------------+----------+---------+ Right Cephalic   Diameter (cm)Depth (cm)Findings  +-----------------+-------------+----------+---------+ Shoulder             0.37        0.25             +-----------------+-------------+----------+---------+ Prox upper arm       0.25        0.15   branching +-----------------+-------------+----------+---------+ Mid upper arm        0.19        0.15             +-----------------+-------------+----------+---------+ Dist upper arm       0.22        0.15             +-----------------+-------------+----------+---------+ Antecubital fossa    0.40        0.43             +-----------------+-------------+----------+---------+ Prox forearm         0.36        0.38             +-----------------+-------------+----------+---------+ Mid forearm          0.29        0.27             +-----------------+-------------+----------+---------+ Dist forearm         0.32        0.23             +-----------------+-------------+----------+---------+ Wrist                0.31        0.21             +-----------------+-------------+----------+---------+ +-----------------+-------------+----------+---------+ Right Basilic    Diameter (cm)Depth (cm)Findings  +-----------------+-------------+----------+---------+  Prox upper arm       0.77                         +-----------------+-------------+----------+---------+ Mid upper arm        0.56                         +-----------------+-------------+----------+---------+ Dist upper arm       0.54                         +-----------------+-------------+----------+---------+  Antecubital fossa    0.35               branching +-----------------+-------------+----------+---------+ Prox forearm         0.33                         +-----------------+-------------+----------+---------+ Mid forearm          0.25                         +-----------------+-------------+----------+---------+ Distal forearm       0.22                         +-----------------+-------------+----------+---------+ Wrist                0.15                         +-----------------+-------------+----------+---------+ +--------------+-------------+----------+--------------+ Left Cephalic Diameter (cm)Depth (cm)   Findings    +--------------+-------------+----------+--------------+ Shoulder          0.39        0.79                  +--------------+-------------+----------+--------------+ Prox upper arm    0.49        0.22                  +--------------+-------------+----------+--------------+ Mid upper arm     0.46        0.18   Thrombosed AVF +--------------+-------------+----------+--------------+ +-----------------+-------------+----------+---------+ Left Basilic     Diameter (cm)Depth (cm)Findings  +-----------------+-------------+----------+---------+ Prox upper arm       0.54                         +-----------------+-------------+----------+---------+ Mid upper arm        0.48                         +-----------------+-------------+----------+---------+ Dist upper arm       0.47                         +-----------------+-------------+----------+---------+ Antecubital fossa    0.35               branching  +-----------------+-------------+----------+---------+ Prox forearm         0.35                         +-----------------+-------------+----------+---------+ Mid forearm          0.28                         +-----------------+-------------+----------+---------+ Distal forearm       0.30                         +-----------------+-------------+----------+---------+ Wrist                0.29                         +-----------------+-------------+----------+---------+ *See table(s) above for measurements and observations.  Diagnosing physician: Coral Else MD Electronically signed by Coral Else MD on 06/19/2023 at 10:46:04 PM.    Final    DG CHEST PORT 1  VIEW  Result Date: 06/18/2023 CLINICAL DATA:  Shortness of breath. EXAM: PORTABLE CHEST 1 VIEW COMPARISON:  Chest x-ray dated June 11, 2023. FINDINGS: Unchanged borderline cardiomegaly. New small bilateral pleural effusions with bibasilar atelectasis. No pneumothorax. No acute osseous abnormality. IMPRESSION: 1. New small bilateral pleural effusions with bibasilar atelectasis. Electronically Signed   By: Obie Dredge M.D.   On: 06/18/2023 19:48   PERIPHERAL VASCULAR CATHETERIZATION  Result Date: 06/17/2023   Mid LAD lesion is 80% stenosed.   Mid Cx to Dist Cx lesion is 90% stenosed.   Ramus lesion is 60% stenosed.   RPDA lesion is 90% stenosed.   Prox RCA lesion is 80% stenosed.   Prox RCA to Mid RCA lesion is 99% stenosed.   Mid RCA lesion is 70% stenosed.   RPAV lesion is 60% stenosed.   A drug-eluting stent was successfully placed using a SYNERGY XD 3.0X48.   A drug-eluting stent was successfully placed using a SYNERGY XD 3.50X16.   Post intervention, there is a 0% residual stenosis.   Post intervention, there is a 0% residual stenosis.   Post intervention, there is a 0% residual stenosis. Moderately to severely elevated left ventricular end-diastolic pressure at 32 mmHg. 2. Successful complex intravascular lithotripsy and 2  overlapped drug-eluting stent placement to the mid and proximal right coronary artery.  Very difficult procedure overall due to heavy calcifications and difficulty crossing the stenosis in the mid right coronary artery.  The distal RCA bifurcation disease appeared better than before likely due to improved upstream flow and thus I elected not to treat. 3.  The patient did go into polymorphic ventricular tachycardia that lasted for about 10 seconds before starting the procedure.  She converted and did not require defibrillation. 4.  Difficulty sedating the patient with continuous involuntary movements of her lower extremities throughout the case. Recommendations: Dual antiplatelet therapy for at least 12 months. I discontinued heparin drip. I switch amiodarone drip to oral. Recommend treating the LAD disease medically.   CARDIAC CATHETERIZATION  Result Date: 06/17/2023   Mid LAD lesion is 80% stenosed.   Mid Cx to Dist Cx lesion is 90% stenosed.   Ramus lesion is 60% stenosed.   RPDA lesion is 90% stenosed.   Prox RCA lesion is 80% stenosed.   Prox RCA to Mid RCA lesion is 99% stenosed.   Mid RCA lesion is 70% stenosed.   RPAV lesion is 60% stenosed.   A drug-eluting stent was successfully placed using a SYNERGY XD 3.0X48.   A drug-eluting stent was successfully placed using a SYNERGY XD 3.50X16.   Post intervention, there is a 0% residual stenosis.   Post intervention, there is a 0% residual stenosis.   Post intervention, there is a 0% residual stenosis. Moderately to severely elevated left ventricular end-diastolic pressure at 32 mmHg. 2. Successful complex intravascular lithotripsy and 2 overlapped drug-eluting stent placement to the mid and proximal right coronary artery.  Very difficult procedure overall due to heavy calcifications and difficulty crossing the stenosis in the mid right coronary artery.  The distal RCA bifurcation disease appeared better than before likely due to improved upstream flow and thus  I elected not to treat. 3.  The patient did go into polymorphic ventricular tachycardia that lasted for about 10 seconds before starting the procedure.  She converted and did not require defibrillation. 4.  Difficulty sedating the patient with continuous involuntary movements of her lower extremities throughout the case. Recommendations: Dual antiplatelet therapy for at least 12  months. I discontinued heparin drip. I switch amiodarone drip to oral. Recommend treating the LAD disease medically.   CARDIAC CATHETERIZATION  Result Date: 06/15/2023   Mid LAD lesion is 80% stenosed.   Mid Cx to Dist Cx lesion is 90% stenosed.   Ramus lesion is 60% stenosed.   Prox RCA to Mid RCA lesion is 95% stenosed.   Mid RCA to Dist RCA lesion is 60% stenosed.   RPDA lesion is 90% stenosed.   RPAV lesion is 80% stenosed.   There is moderate to severe left ventricular systolic dysfunction.   LV end diastolic pressure is mildly elevated.   The left ventricular ejection fraction is 25-35% by visual estimate. 1.  Severe three-vessel coronary artery disease.  The culprit for myocardial infarction is likely subtotal occlusion of the right coronary artery which is heavily calcified and diffusely diseased in the proximal/mid segment.  In addition, there is complex bifurcation disease at the right PDA and right posterior AV groove artery. 2.  Mildly with severely reduced LV systolic function with an EF of 30% with severe inferior and apical hypokinesis. 3.  Right heart catheterization showed mildly elevated filling pressures, mild pulmonary hypertension and normal cardiac output.  No significant V waves noted on wedge tracing. Recommendations: Difficult revascularization options.  The patient has surgical disease but she might not be a candidate for CABG.  The RCA is the culprit but it is diffusely diseased and heavily calcified.  Will discuss with patient and family and consider transfer to [facility] for evaluation.  If we  decide to PCI, it is preferable to transfuse her to get her hemoglobin above 9 and load with an antiplatelet medication.   PERIPHERAL VASCULAR CATHETERIZATION  Result Date: 06/12/2023 See surgical note for result.  DG Chest 2 View  Result Date: 06/11/2023 CLINICAL DATA:  SOB, eval edema, new onset ESRD EXAM: CHEST - 2 VIEW COMPARISON:  June 04, 2023 FINDINGS: The cardiomediastinal silhouette is unchanged in contour.Mild bronchial cuffing with interstitial prominence. No pleural effusion. No pneumothorax. Visualized abdomen is unremarkable. Multilevel degenerative changes of the thoracic spine. IMPRESSION: Constellation of findings are favored to reflect mild underlying pulmonary edema. Electronically Signed   By: Meda Klinefelter M.D.   On: 06/11/2023 16:22   NM Myocar Multi W/Spect W/Wall Motion / EF  Result Date: 06/05/2023 Pharmacological myocardial perfusion imaging study with no significant  ischemia Large fixed defect in the inferior and inferolateral wall consistent with prior MI Hypokinesis of the inferior and inferolateral wall, EF estimated at 31% EKG changes concerning for ischemia at peak stress/recovery (diffuse ST elevations in the inferior and anterolateral leads V3 through V6. CT attenuation correction images with mild diffuse aortic atherosclerosis, coronary calcification, predominantly in the RCA High risk scan in the setting of large infarct Signed, Dossie Arbour, MD, Ph.D [facility]   US RENAL  Result Date: 06/05/2023 CLINICAL DATA:  Acute renal injury EXAM: RENAL / URINARY TRACT ULTRASOUND COMPLETE COMPARISON:  None Available. FINDINGS: Right Kidney: Length = 7.7 cm Renal cortical thinning. Diffusely increased cortical echogenicity with preserved corticomedullary differentiation which can be seen with medical renal disease. No urinary tract dilation or shadowing calculi. The ureter is not seen. Left Kidney: Length = 8.3 cm Renal cortical thinning. Diffusely increased cortical  echogenicity with diminished corticomedullary differentiation which can be seen with medical renal disease. Interpolar simple-appearing cyst measures 10 mm. No urinary tract dilation or shadowing calculi. The ureter is not seen. Bladder: Appears normal for degree of bladder  distention. Other: None. IMPRESSION: 1. No urinary tract dilation or shadowing calculi. 2. Bilateral renal cortical thinning and increased cortical echogenicity which can be seen with medical renal disease. Electronically Signed   By: Agustin Cree M.D.   On: 06/05/2023 09:56   ECHOCARDIOGRAM COMPLETE  Result Date: 06/04/2023    ECHOCARDIOGRAM REPORT   Patient Name:   Cassandra Baker Date of Exam: 06/04/2023 Medical Rec #:  [id number]      Height:       64.0 in Accession #:    [id number]     Weight:       148.0 lb Date of Birth:  December 25, 1940      BSA:          1.721 m Patient Age:    81 years       BP:           124/74 mmHg Patient Gender: F              HR:           97 bpm. Exam Location:  [facility] Procedure: 2D Echo, Cardiac Doppler, Color Doppler and Strain Analysis Indications:     CHF, NSTEMI  History:         Patient has no prior history of Echocardiogram examinations.                  CHF, Acute MI; Risk Factors:Diabetes. CKD.  Sonographer:     Mikki Harbor Referring Phys:  [id number] JAN A MANSY Diagnosing Phys: Julien Nordmann MD  Sonographer Comments: Global longitudinal strain was attempted. IMPRESSIONS  1. Left ventricular ejection fraction, by estimation, is 30 to 35%. The left ventricle has moderately decreased function. The left ventricle demonstrates mild global hypokinesis with severe hypokinesis of the inferior/posterior/septal wall. There is mild left ventricular hypertrophy. Left ventricular diastolic parameters are consistent with Grade I diastolic dysfunction (impaired relaxation).  2. Right ventricular systolic function is normal. The right ventricular size is normal.  3. The mitral valve is normal in structure. Moderate mitral  valve regurgitation. No evidence of mitral stenosis. Moderate mitral annular calcification.  4. The aortic valve is normal in structure. Aortic valve regurgitation is not visualized. No aortic stenosis is present. Aortic valve mean gradient measures 8.0 mmHg.  5. The inferior vena cava is normal in size with greater than 50% respiratory variability, suggesting right atrial pressure of 3 mmHg. FINDINGS  Left Ventricle: Left ventricular ejection fraction, by estimation, is 30 to 35%. The left ventricle has moderately decreased function. The left ventricle demonstrates global hypokinesis. The average left ventricular global longitudinal strain is -4.4 %.  The left ventricular internal cavity size was normal in size. There is mild left ventricular hypertrophy. Left ventricular diastolic parameters are consistent with Grade I diastolic dysfunction (impaired relaxation). Right Ventricle: The right ventricular size is normal. No increase in right ventricular wall thickness. Right ventricular systolic function is normal. Left Atrium: Left atrial size was normal in size. Right Atrium: Right atrial size was normal in size. Pericardium: There is no evidence of pericardial effusion. Mitral Valve: The mitral valve is normal in structure. Moderate mitral annular calcification. Moderate mitral valve regurgitation. No evidence of mitral valve stenosis. MV peak gradient, 5.0 mmHg. The mean mitral valve gradient is 2.0 mmHg. Tricuspid Valve: The tricuspid valve is normal in structure. Tricuspid valve regurgitation is not demonstrated. No evidence of tricuspid stenosis. Aortic Valve: The aortic valve is normal in structure. Aortic valve regurgitation is not visualized. No  aortic stenosis is present. Aortic valve mean gradient measures 8.0 mmHg. Aortic valve peak gradient measures 13.1 mmHg. Aortic valve area, by VTI measures 1.57 cm. Pulmonic Valve: The pulmonic valve was normal in structure. Pulmonic valve regurgitation is not  visualized. No evidence of pulmonic stenosis. Aorta: The aortic root is normal in size and structure. Venous: The inferior vena cava is normal in size with greater than 50% respiratory variability, suggesting right atrial pressure of 3 mmHg. IAS/Shunts: The interatrial septum appears to be lipomatous. No atrial level shunt detected by color flow Doppler.  LEFT VENTRICLE PLAX 2D LVIDd:         4.90 cm     Diastology LVIDs:         4.20 cm     LV e' medial:    5.44 cm/s LV PW:         1.50 cm     LV E/e' medial:  21.1 LV IVS:        1.10 cm     LV e' lateral:   6.96 cm/s LVOT diam:     2.00 cm     LV E/e' lateral: 16.5 LV SV:         63 LV SV Index:   37          2D Longitudinal Strain LVOT Area:     3.14 cm    2D Strain GLS Avg:     -4.4 %  LV Volumes (MOD) LV vol d, MOD A2C: 85.3 ml LV vol d, MOD A4C: 73.8 ml LV vol s, MOD A2C: 48.3 ml LV vol s, MOD A4C: 37.4 ml LV SV MOD A2C:     37.0 ml LV SV MOD A4C:     73.8 ml LV SV MOD BP:      38.6 ml RIGHT VENTRICLE RV Basal diam:  3.25 cm RV Mid diam:    2.50 cm RV S prime:     13.10 cm/s TAPSE (M-mode): 2.7 cm LEFT ATRIUM             Index        RIGHT ATRIUM           Index LA diam:        3.90 cm 2.27 cm/m   RA Area:     12.70 cm LA Vol (A2C):   41.4 ml 24.05 ml/m  RA Volume:   31.80 ml  18.47 ml/m LA Vol (A4C):   58.4 ml 33.93 ml/m LA Biplane Vol: 49.8 ml 28.93 ml/m  AORTIC VALVE                     PULMONIC VALVE AV Area (Vmax):    1.69 cm      PV Vmax:       1.17 m/s AV Area (Vmean):   1.37 cm      PV Peak grad:  5.5 mmHg AV Area (VTI):     1.57 cm AV Vmax:           181.00 cm/s AV Vmean:          139.000 cm/s AV VTI:            0.405 m AV Peak Grad:      13.1 mmHg AV Mean Grad:      8.0 mmHg LVOT Vmax:         97.40 cm/s LVOT Vmean:        60.600 cm/s LVOT VTI:  0.202 m LVOT/AV VTI ratio: 0.50  AORTA Ao Root diam: 2.80 cm MITRAL VALVE MV Area (PHT): 5.16 cm       SHUNTS MV Area VTI:   2.11 cm       Systemic VTI:  0.20 m MV Peak grad:  5.0 mmHg        Systemic Diam: 2.00 cm MV Mean grad:  2.0 mmHg MV Vmax:       1.12 m/s MV Vmean:      63.1 cm/s MV Decel Time: 147 msec MR Peak grad:    129.0 mmHg MR Mean grad:    88.0 mmHg MR Vmax:         568.00 cm/s MR Vmean:        441.0 cm/s MR PISA:         1.01 cm MR PISA Eff ROA: 16 mm MR PISA Radius:  0.40 cm MV E velocity: 115.00 cm/s MV A velocity: 125.00 cm/s MV E/A ratio:  0.92 Julien Nordmann MD Electronically signed by Julien Nordmann MD Signature Date/Time: 06/04/2023/4:58:01 PM    Final    DG Chest 1 View  Result Date: 06/04/2023 CLINICAL DATA:  Shortness of breath EXAM: CHEST  1 VIEW COMPARISON:  03/11/2018 FINDINGS: There is hyperinflation of the lungs compatible with COPD. Increased markings in the lung bases. No effusions or pneumothorax. Heart mediastinal contours within normal limits. Aortic atherosclerosis. IMPRESSION: COPD. Increased markings in the lung bases could reflect atelectasis or scarring. Electronically Signed   By: Charlett Nose M.D.   On: 06/04/2023 00:34   Disposition   Pt is being discharged home today in good condition.  Follow-up Plans & Appointments     Follow-up Information     Victorino Sparrow, MD Follow up in 6 week(s).   Specialty: Vascular Surgery Why: Office will call you to arrange your appt (sent) Contact information: [address] [place] [address] [phone]         [facility]. Go on 06/29/2023.   Why: Schedule is Monday,Wednesday,Friday with 11:00 am chair time.  On Monday, please arrive at 10:15 to complete paperwork prior to treatment. Contact information: [address] [place] [address] [phone]         Sharlene Dory, PA-C Follow up on 07/07/2023.   Specialty: Cardiology Why: 2:45PM. Cardiology follow up Contact information: [address] [place] [address] [phone]                Discharge Instructions     Amb Referral to Cardiac Rehabilitation   Complete by: As  directed    Farnam   Diagnosis:  NSTEMI PTCA Coronary Stents     After initial evaluation and assessments completed: Virtual Based Care may be provided alone or in conjunction with Phase 2 Cardiac Rehab based on patient barriers.: Yes   Intensive Cardiac Rehabilitation (ICR) [facility] location only OR Traditional Cardiac Rehabilitation (TCR) *If criteria for ICR are not met will enroll in TCR [facility] only): Yes        Discharge Medications   Allergies as of 06/28/2023       Reactions   Plum Pulp Anaphylaxis, Cough   Almond (diagnostic) Itching   Other Itching   Dairy products/Soaps & detergents   Peanut-containing Drug Products Itching   Penicillins Rash   Tolerates ceftriaxone        Medication List     TAKE these medications    acetaminophen 500 MG tablet Commonly known as:  TYLENOL Take 500-1,000 mg by mouth every 6 (six) hours as needed (pain).   allopurinol 100 MG tablet Commonly known as: ZYLOPRIM Take 100 mg by mouth daily as needed (For gout).   amiodarone 200 MG tablet Commonly known as: PACERONE Take 1 tablet (200 mg total) by mouth daily.   aspirin EC 81 MG tablet Take 1 tablet (81 mg total) by mouth daily. Swallow whole.   atorvastatin 80 MG tablet Commonly known as: LIPITOR Take 1 tablet (80 mg total) by mouth daily.   calcium acetate 667 MG tablet Commonly known as: PHOSLO Take 667 mg by mouth 3 (three) times daily.   clopidogrel 75 MG tablet Commonly known as: PLAVIX Take 1 tablet (75 mg total) by mouth daily.   FLUoxetine 10 MG capsule Commonly known as: PROZAC Take 10 mg by mouth daily. At night   furosemide 40 MG tablet Commonly known as: LASIX Take 80 mg by mouth 2 (two) times daily.   hydrOXYzine 10 MG tablet Commonly known as: ATARAX Take 10 mg by mouth at bedtime as needed for itching.   levothyroxine 75 MCG tablet Commonly known as: SYNTHROID Take 75 mcg by mouth daily before breakfast.   loratadine 5 MG chewable  tablet Commonly known as: CLARITIN Chew 5 mg by mouth daily as needed for allergies.   metoprolol succinate 25 MG 24 hr tablet Commonly known as: TOPROL-XL Take 1 tablet (25 mg total) by mouth daily. Take with or immediately following a meal. What changed: additional instructions   nitroGLYCERIN 0.4 MG SL tablet Commonly known as: NITROSTAT Place 1 tablet (0.4 mg total) under the tongue every 5 (five) minutes x 3 doses as needed for chest pain.   sitaGLIPtin 25 MG tablet Commonly known as: JANUVIA Take 25 mg by mouth daily.   sodium bicarbonate 650 MG tablet Take 650 mg by mouth 2 (two) times daily.           Outstanding Labs/Studies   N/A  Duration of Discharge Encounter   Greater than 30 minutes including physician time.  Ramond Dial, PA 06/28/2023, 10:29 AM

## 2023-06-24 LAB — GLUCOSE, CAPILLARY
Glucose-Capillary: 142 mg/dL — ABNORMAL HIGH (ref 70–99)
Glucose-Capillary: 126 mg/dL — ABNORMAL HIGH (ref 70–99)
Glucose-Capillary: 145 mg/dL — ABNORMAL HIGH (ref 70–99)
Glucose-Capillary: 143 mg/dL — ABNORMAL HIGH (ref 70–99)

## 2023-06-24 NOTE — Progress Notes (Signed)
Mobility Specialist Progress Note:    06/24/23 1040  Mobility  Activity Ambulated with assistance in hallway  Level of Assistance Contact guard assist, steadying assist  Assistive Device Front wheel walker  Distance Ambulated (ft) 216 ft  Activity Response Tolerated well  Mobility Referral Yes  $Mobility charge 1 Mobility  Mobility Specialist Start Time (ACUTE ONLY) 0930  Mobility Specialist Stop Time (ACUTE ONLY) 0948  Mobility Specialist Time Calculation (min) (ACUTE ONLY) 18 min   Pt received in bed, agreeable to ambulate. Pt needed minA w/ bed mobility and contact guard for STS. Had no c/o throughout session. Assisted to chair w/ call bell and personal belongings at hand. All needs met.   Thompson Grayer Mobility Specialist  Please contact vis Secure Chat or  Rehab Office [phone]

## 2023-06-24 NOTE — Progress Notes (Signed)
Patient ID: [name], female   DOB: 09/14/1941, 82 y.o.   MRN: [id number] S: No new complaints O:BP 134/70 (BP Location: Right Arm)   Pulse 78   Temp 99.1 F (37.3 C) (Oral) Comment: eating warm oatmeal at this time.  Resp 18   Ht 5\' 4"  (1.626 m)   Wt 69.9 kg   LMP  (LMP Unknown)   SpO2 96%   BMI 26.45 kg/m   Intake/Output Summary (Last 24 hours) at 06/24/2023 0908 Last data filed at 06/24/2023 0846 Gross per 24 hour  Intake 520 ml  Output 2300 ml  Net -1780 ml   Intake/Output: I/O last 3 completed shifts: In: 880 [P.O.:780; IV Piggyback:100] Out: 2750 [Urine:450; Other:2300]  Intake/Output this shift:  Total I/O In: 120 [P.O.:120] Out: -  Weight change: -0.168 kg Gen: NAD CVS: RRR Resp:CTA Abd: +BS, soft, NT/ND Ext: trace pretibial edema, LUE AVF +T/B  Recent Labs  Lab 06/18/23 0556 06/20/23 0839 06/22/23 1136 06/23/23 0043  NA 124*  128* 127* 127* 125*  K 3.8  3.9 3.8 4.3 4.9  CL 89*  88* 88* 91* 89*  CO2 24  23 25   --  25  GLUCOSE 88  91 105* 108* 185*  BUN 32*  34* 33* 37* 40*  CREATININE 3.67*  3.69* 4.07* 4.50* 5.03*  ALBUMIN 2.5* 2.9*  --   --   CALCIUM 8.7*  9.0 9.7  --  9.8  PHOS 5.8* 4.3  --   --    Liver Function Tests: Recent Labs  Lab 06/18/23 0556 06/20/23 0839  ALBUMIN 2.5* 2.9*   No results for input(s): "LIPASE", "AMYLASE" in the last 168 hours. No results for input(s): "AMMONIA" in the last 168 hours. CBC: Recent Labs  Lab 06/19/23 0527 06/20/23 0047 06/21/23 0106 06/22/23 0108 06/22/23 1136 06/23/23 0043  WBC 8.5 8.4 9.0 9.5  --  12.9*  HGB 9.2* 9.4* 9.4* 9.5* 11.9* 9.9*  HCT 28.9* 29.0* 30.0* 29.9* 35.0* 31.2*  MCV 92.0 93.9 95.5 96.1  --  95.7  PLT 121* 127* 130* 140*  --  170   Cardiac Enzymes: No results for input(s): "CKTOTAL", "CKMB", "CKMBINDEX", "TROPONINI" in the last 168 hours. CBG: Recent Labs  Lab 06/23/23 0613 06/23/23 1250 06/23/23 1551 06/23/23 2111 06/24/23 0607  GLUCAP 139* 122* 137*  148* 142*    Iron Studies: No results for input(s): "IRON", "TIBC", "TRANSFERRIN", "FERRITIN" in the last 72 hours. Studies/Results: DG CHEST PORT 1 VIEW  Result Date: 06/22/2023 CLINICAL DATA:  Dialysis catheter EXAM: PORTABLE CHEST 1 VIEW COMPARISON:  06/18/2023, 06/11/2023 FINDINGS: Right-sided central venous catheter tip near the cavoatrial region. Cardiomegaly with bilateral effusions, vascular congestion and mild interstitial edema. Aortic atherosclerosis. No pneumothorax IMPRESSION: 1. Right-sided central venous catheter tip near the cavoatrial region. No pneumothorax. 2. Cardiomegaly with bilateral effusions, vascular congestion and mild interstitial edema. Electronically Signed   By: Jasmine Pang M.D.   On: 06/22/2023 18:37   HYBRID OR IMAGING ([facility] ONLY)  Result Date: 06/22/2023 There is no interpretation for this exam.  This order is for images obtained during a surgical procedure.  Please See "Surgeries" Tab for more information regarding the procedure.    amiodarone  200 mg Oral BID   aspirin EC  81 mg Oral Daily   atorvastatin  80 mg Oral Daily   calcium acetate  667 mg Oral TID WC   Chlorhexidine Gluconate Cloth  6 each Topical Q0600   clopidogrel  75 mg Oral Daily  darbepoetin (ARANESP) injection - DIALYSIS  60 mcg Subcutaneous Q Thu-1800   feeding supplement (NEPRO CARB STEADY)  237 mL Oral BID BM   FLUoxetine  10 mg Oral Once per day on Mon Wed Fri   furosemide  80 mg Oral BID   insulin aspart  0-15 Units Subcutaneous TID WC   levothyroxine  75 mcg Oral QAC breakfast   metoprolol succinate  25 mg Oral Daily   multivitamin  1 tablet Oral QHS   sodium bicarbonate  650 mg Oral BID    BMET    Component Value Date/Time   NA 125 (L) 06/23/2023 0043   NA 136 03/03/2014 1004   K 4.9 06/23/2023 0043   K 4.4 03/03/2014 1004   CL 89 (L) 06/23/2023 0043   CL 106 03/03/2014 1004   CO2 25 06/23/2023 0043   CO2 25 03/03/2014 1004   GLUCOSE 185 (H) 06/23/2023 0043   GLUCOSE  212 (H) 03/03/2014 1004   BUN 40 (H) 06/23/2023 0043   BUN 44 (H) 03/03/2014 1004   CREATININE 5.03 (H) 06/23/2023 0043   CREATININE 1.86 (H) 03/03/2014 1004   CALCIUM 9.8 06/23/2023 0043   CALCIUM 9.5 03/03/2014 1004   GFRNONAA 8 (L) 06/23/2023 0043   GFRNONAA 26 (L) 03/03/2014 1004   GFRAA 23 (L) 02/18/2018 1424   GFRAA 31 (L) 03/03/2014 1004   CBC    Component Value Date/Time   WBC 12.9 (H) 06/23/2023 0043   RBC 3.26 (L) 06/23/2023 0043   HGB 9.9 (L) 06/23/2023 0043   HGB 12.0 03/03/2014 1004   HCT 31.2 (L) 06/23/2023 0043   HCT 35.9 03/03/2014 1004   PLT 170 06/23/2023 0043   PLT 177 03/03/2014 1004   MCV 95.7 06/23/2023 0043   MCV 84 03/03/2014 1004   MCH 30.4 06/23/2023 0043   MCHC 31.7 06/23/2023 0043   RDW 15.3 06/23/2023 0043   RDW 15.3 (H) 03/03/2014 1004   LYMPHSABS 0.7 06/13/2023 0433   MONOABS 0.8 06/13/2023 0433   EOSABS 0.3 06/13/2023 0433   BASOSABS 0.1 06/13/2023 0433    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 Allena Katz. PMH: CAD, NSTEMI, DMT2, HTN, hypothyroidism.    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 Had 1st HD 6/21 and 2nd HD 6/24 (shortened to 1 hr by pt) at [facility]. Had 3rd HD 6/25 and the 4th HD session on 6/27 here.  Next HD 06/25/2023 Polymorphic VT-seen by cards. Likely mediated by ischemia. Cardiology following. 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 G1DD. Appears euvolemic. Optimize volume with HD.  Vascular access: VVS consulted for permanent access. S/p RIJ TDC and left BC AVF creation 06/22/23 by Dr. Karin Lieu. Temp femoral temporary cath removed 06/22/23.  NSTEMI/ CAD - w/ heart cath showing 3V CAD done at [facility]. Underwent PCI to RCA on 06/18/2023. Cardiology following.   HTN/ volume -BP elevated today. No evidence of volume overload by exam. UF as tolerated.    Hyponatremia - changing to renal diet w/ fluid restriction 1200 cc/d. Na+ 127 today.  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 Tsat done 24%, ferritin 152. Started  darbe 60 mcg sq weekly while here.  MBD ckd - CCa in range. Getting phoslo at 1 ac tid. F/u phos improving w/ hd and binders.  Get pth.  A/C systolic CHF - no vol overload. Echo showed LVEF 30-35%.  DM2 Hypothyroid Anxiety/ depression EColi UTI  Jomarie Longs A.  Arrie Aran, MD [employer]

## 2023-06-24 NOTE — Progress Notes (Addendum)
Contacted by provider this am that pt has decided to stay with son at d/c. Met with pt at bedside who confirms that she will likely stay with son for several months prior to returning home. Pt prefers the [facility] clinic while staying with son in [place]. Pt also stating she needs a MWF schedule for religious reasons. Will check to see what options are available to meet pt's needs. Will assist as needed.   Olivia Canter Renal Navigator [phone]  Addendum: [name] is at capacity. Discussed with pt. Pt inquires if [facility] has a MWF available. Navigator advised by [facility] that [facility] is at capacity unless pt willing to accept the TTS schedule already provided to pt. Spoke to pt again regarding no MWF at [facility] but the TTS schedule that was provided to pt earlier is still an option. Pt inquiring if any [place] clinics have a MWF available and if not, pt likely willing to accept TTS at [facility]. Contacted [facility] admissions to inquire about any possible MWF in [place] area. Awaiting a response. Discussed with pt the option of making referral to clinics in [facility] or [place] if pt is interested in any of those clinics. Pt prefers to continue with [facility].   Addendum at 3:04 pm: [facility] [place] is reviewing pt's referral for a MWF schedule. Update provided to pt via phone. Update provided to cardiology PA as well via secure chat. If no determination is made this afternoon, will f/u with [facility]/clinic on Friday morning due to tomorrow's holiday.

## 2023-06-24 NOTE — Progress Notes (Signed)
CSW spoke with pt daughter Cassandra Baker. Daughter expressed she was interested in pt returning to live with her for a while and was interesting in what equipment or services she could receive at home. Daughter also expressed interest in wanting PT/OT to see pt to see if she is a possible candidate for SNF. CSW messaged MD to have PT/OT eval. CSW communicated with CM daughter interest having pt live with her.

## 2023-06-24 NOTE — Progress Notes (Signed)
Pt ambulated with MT. Has RW at home, doing well. Discussed walking at home, NTG, and CRPII. Pt receptive. Already referred to [facility]. Pt sts she is very motivated and has more living to do.  [phone] Ethelda Chick BS, ACSM-CEP 06/24/2023 11:41 AM

## 2023-06-24 NOTE — Progress Notes (Signed)
   Patient Name: Cassandra Baker Date of Encounter: 06/24/2023 [facility] Cardiologist: Julien Nordmann, MD   Interval Summary  .    No chest pain  Vital Signs .    Vitals:   06/23/23 1621 06/23/23 2008 06/24/23 0005 06/24/23 0400  BP: (!) 133/41 (!) 99/44 (!) 89/33 (!) 120/57  Pulse: 67 70 66 74  Resp: 18 19 18 19   Temp:  98.8 F (37.1 C) 98.5 F (36.9 C) 98.8 F (37.1 C)  TempSrc:  Oral Oral Oral  SpO2: 97% 90% 97% 96%  Weight:    69.9 kg  Height:        Intake/Output Summary (Last 24 hours) at 06/24/2023 0734 Last data filed at 06/24/2023 0300 Gross per 24 hour  Intake 400 ml  Output 2300 ml  Net -1900 ml      06/24/2023    4:00 AM 06/23/2023   11:45 AM 06/23/2023    7:45 AM  Last 3 Weights  Weight (lbs) 154 lb 1.6 oz 154 lb 1.6 oz 157 lb 10.1 oz  Weight (kg) 69.9 kg 69.9 kg 71.5 kg      Telemetry/ECG    Sinus - Personally Reviewed  Physical Exam .    General: Well developed, well nourished, NAD  HEENT: OP clear, mucus membranes moist  SKIN: warm, dry. No rashes. Neuro: No focal deficits  Musculoskeletal: Muscle strength 5/5 all ext  Psychiatric: Mood and affect normal  Neck: No JVD Lungs:Clear bilaterally, no wheezes, rhonci, crackles Cardiovascular: Regular rate and rhythm. No murmurs, gallops or rubs. Abdomen:Soft.  Extremities: No lower extremity edema.   Assessment & Plan .     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 No chest pain today. Will plan to continue DAPT with ASA and Plavix for one year. Continue statin and beta blocker.    Polymorphic Ventricular tachycardia: Patient went into polymorphic VT for 10 seconds prior to RCA PCI on 06/17/23. She converted spontaneously and did not require defibrillation. This was felt to be due to ongoing ischemia prior to her PCI. Will continue amiodarone and Toprol.   Chronic combined systolic and diastolic heart failure: volume control per HD. Also on  Lasix 80 mg BID per nephrology   ESRD: Now on HD. Patient is deciding if she is going to be discharged home vs staying with family- once that decision is made, outpatient HD can be arranged and patient can be discharged   Anemia of chronic disease vs acute on chronic anemia: Stable yesterday.   Dispo: Will plan to d/c home today if ok with Nephrology.   For questions or updates, please contact [facility] Please consult [email or web address] for contact info under        Signed, Verne Carrow, MD , [facility] 06/24/2023 7:34 AM

## 2023-06-25 DIAGNOSIS — I5042 Chronic combined systolic (congestive) and diastolic (congestive) heart failure: Secondary | ICD-10-CM

## 2023-06-25 LAB — RENAL FUNCTION PANEL
Albumin: 2.9 g/dL — ABNORMAL LOW (ref 3.5–5.0)
Anion gap: 13 (ref 5–15)
BUN: 46 mg/dL — ABNORMAL HIGH (ref 8–23)
CO2: 26 mmol/L (ref 22–32)
Calcium: 9.4 mg/dL (ref 8.9–10.3)
Chloride: 88 mmol/L — ABNORMAL LOW (ref 98–111)
Creatinine, Ser: 4.44 mg/dL — ABNORMAL HIGH (ref 0.44–1.00)
GFR, Estimated: 9 mL/min — ABNORMAL LOW (ref >60)
Glucose, Bld: 210 mg/dL — ABNORMAL HIGH (ref 70–99)
Phosphorus: 4.4 mg/dL (ref 2.5–4.6)
Potassium: 4.1 mmol/L (ref 3.5–5.1)
Sodium: 127 mmol/L — ABNORMAL LOW (ref 135–145)

## 2023-06-25 LAB — CBC
HCT: 20.7 % — ABNORMAL LOW (ref 36.0–46.0)
Hemoglobin: 6.3 g/dL — CL (ref 12.0–15.0)
MCH: 30.6 pg (ref 26.0–34.0)
MCHC: 30.4 g/dL (ref 30.0–36.0)
MCV: 100.5 fL — ABNORMAL HIGH (ref 80.0–100.0)
Platelets: 96 10*3/uL — ABNORMAL LOW (ref 150–400)
RBC: 2.06 MIL/uL — ABNORMAL LOW (ref 3.87–5.11)
RDW: 16.1 % — ABNORMAL HIGH (ref 11.5–15.5)
WBC: 5.8 10*3/uL (ref 4.0–10.5)
nRBC: 0 % (ref 0.0–0.2)

## 2023-06-25 LAB — GLUCOSE, CAPILLARY
Glucose-Capillary: 114 mg/dL — ABNORMAL HIGH (ref 70–99)
Glucose-Capillary: 166 mg/dL — ABNORMAL HIGH (ref 70–99)
Glucose-Capillary: 100 mg/dL — ABNORMAL HIGH (ref 70–99)

## 2023-06-25 LAB — HEMOGLOBIN AND HEMATOCRIT, BLOOD
HCT: 32.5 % — ABNORMAL LOW (ref 36.0–46.0)
Hemoglobin: 10.3 g/dL — ABNORMAL LOW (ref 12.0–15.0)

## 2023-06-25 LAB — BPAM RBC

## 2023-06-25 LAB — TYPE AND SCREEN
Antibody Screen: NEGATIVE
Unit division: 0

## 2023-06-25 LAB — PREPARE RBC (CROSSMATCH)

## 2023-06-25 MED ORDER — HEPARIN SODIUM (PORCINE) 1000 UNIT/ML IJ SOLN
INTRAMUSCULAR | Status: AC
  Filled 2023-06-25: qty 4

## 2023-06-25 MED ORDER — HEPARIN SODIUM (PORCINE) 1000 UNIT/ML DIALYSIS: 20.0000 [IU]/kg | INTRAMUSCULAR | Status: DC | PRN

## 2023-06-25 MED ORDER — SODIUM CHLORIDE 0.9% IV SOLUTION: Freq: Once | INTRAVENOUS | Status: AC

## 2023-06-25 NOTE — Progress Notes (Signed)
   Patient Name: Cassandra Baker Date of Encounter: 06/25/2023 [facility] Cardiologist: Julien Nordmann, MD   Interval Summary  .    Seen in HD. No chest pain or SOB. Continues to have LE edema.  Vital Signs .    Vitals:   06/24/23 1700 06/24/23 2157 06/25/23 0000 06/25/23 0605  BP: (!) 101/55 (!) 107/46 (!) 106/49 (!) 115/51  Pulse: 63   60  Resp: 18 18  16   Temp: 98.4 F (36.9 C) 98.9 F (37.2 C) 98.6 F (37 C) 98.4 F (36.9 C)  TempSrc: Oral Oral Oral Oral  SpO2: 97% 100%  97%  Weight:      Height:        Intake/Output Summary (Last 24 hours) at 06/25/2023 0730 Last data filed at 06/24/2023 1700 Gross per 24 hour  Intake 240 ml  Output --  Net 240 ml       06/24/2023    4:00 AM 06/23/2023   11:45 AM 06/23/2023    7:45 AM  Last 3 Weights  Weight (lbs) 154 lb 1.6 oz 154 lb 1.6 oz 157 lb 10.1 oz  Weight (kg) 69.9 kg 69.9 kg 71.5 kg      Telemetry/ECG    NSR - Personally Reviewed  Physical Exam .    GEN: No acute distress.  Laying in bed Neck: Right subclavian HD line in place Cardiac: RRR, no murmurs, rubs, or gallops.  Respiratory: Diminished at bases. Otherwise clear GI: Soft, nontender, non-distended  MS: 1+ LE edema to ankles. Warm Neuro:  Nonfocal  Psych: Normal affect    Assessment & Plan .     CAD/NSTEMI: Patient presented to [facility] with worsening volume overload in the setting of CKD V requiring initiation of HD. Course complicated by torsdes de pointes on 06/13/23. Underwent LHC 06/15/23 which demonstrated severe 3V disease with culprit subtotal occlusion of RCA. Was transferred to [facility] for consideration of CABG but was deemed too high risk for surgery and ultimately underwent  PCI of the RCA with plans for medical management of the LAD disease. Currently, denies chest pain. Will plan to continue DAPT with ASA and Plavix for one year. Continue lipitor 80mg  daily and metop 25mg  XL daily.    Polymorphic Ventricular tachycardia: Patient went into  polymorphic VT for 10 seconds prior to RCA PCI on 06/17/23. She converted spontaneously and did not require defibrillation. This was felt to be due to ongoing ischemia prior to her PCI. Will continue amiodarone 200mg  daily and metop 25mg  XL daily   Chronic combined systolic and diastolic heart failure: LVEF 30-35% with inferior, posterior and septal WMA in the setting of known severe multivessel CAD. Now initiated on HD for volume management. 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 Currently awaiting decision about HD location and schedule. Appreciate Nephrology recommendations.  Anemia of chronic disease vs acute on chronic anemia: Labs pending today. Stable yesterday.  Dispo: Awaiting PT/OT evaluation. Per patient's daughter, concerned she may need SNF. Also need to determine HD location and schedule. Will likely need to stay until tomorrow until dispo determined.  For questions or updates, please contact [facility] Please consult [email or web address] for contact info under        Signed, Meriam Sprague, MD  06/25/2023 7:30 AM

## 2023-06-25 NOTE — Progress Notes (Signed)
PT Cancellation Note  Patient Details Name: Cassandra Baker MRN: [id number] DOB: 06-Jan-1941   Cancelled Treatment:    Reason Eval/Treat Not Completed: Patient at procedure or test/unavailable (Pt in HD.  Will return as able.)   [name] 06/25/2023, 8:36 AM [name],PT Acute Rehab Services [phone]

## 2023-06-25 NOTE — Progress Notes (Signed)
Got a call to bring supplies for pts HD catheter that is bleeding. Went to the pt room and the floor RN jamie was was cleaning the HD catheter and placed a new dressing. By the time I got there the bleed had stopped. Applied pressure dressing on top of the new dressing that was placed by floor RN Marijean Niemann.

## 2023-06-25 NOTE — Progress Notes (Signed)
PT Cancellation Note  Patient Details Name: Cassandra Baker MRN: [id number] DOB: [birth year]   Cancelled Treatment:    Reason Eval/Treat Not Completed: Medical issues which prohibited therapy. Pt receiving blood products upon PT arrival. Pt prefers to initiate PT evaluation tomorrow. Pt will follow up as time allows.   Arlyss Gandy 06/25/2023, 5:07 PM

## 2023-06-25 NOTE — Procedures (Signed)
I was present at this dialysis session. I have reviewed the session itself and made appropriate changes.   Vital signs in last 24 hours:  Temp:  [97.4 F (36.3 C)-98.9 F (37.2 C)] 97.7 F (36.5 C) (07/04 0820) Pulse Rate:  [57-78] 76 (07/04 0900) Resp:  [13-18] 13 (07/04 0900) BP: (101-124)/(43-55) 124/49 (07/04 0900) SpO2:  [97 %-100 %] 98 % (07/04 0900) Weight change:  Filed Weights   06/23/23 0745 06/23/23 1145 06/24/23 0400  Weight: 71.5 kg 69.9 kg 69.9 kg    Recent Labs  Lab 06/20/23 0839 06/22/23 1136 06/23/23 0043  NA 127*   < > 125*  K 3.8   < > 4.9  CL 88*   < > 89*  CO2 25  --  25  GLUCOSE 105*   < > 185*  BUN 33*   < > 40*  CREATININE 4.07*   < > 5.03*  CALCIUM 9.7  --  9.8  PHOS 4.3  --   --    < > = values in this interval not displayed.    Recent Labs  Lab 06/21/23 0106 06/22/23 0108 06/22/23 1136 06/23/23 0043  WBC 9.0 9.5  --  12.9*  HGB 9.4* 9.5* 11.9* 9.9*  HCT 30.0* 29.9* 35.0* 31.2*  MCV 95.5 96.1  --  95.7  PLT 130* 140*  --  170    Scheduled Meds:  amiodarone  200 mg Oral BID   aspirin EC  81 mg Oral Daily   atorvastatin  80 mg Oral Daily   calcium acetate  667 mg Oral TID WC   Chlorhexidine Gluconate Cloth  6 each Topical Q0600   clopidogrel  75 mg Oral Daily   darbepoetin (ARANESP) injection - DIALYSIS  60 mcg Subcutaneous Q Thu-1800   feeding supplement (NEPRO CARB STEADY)  237 mL Oral BID BM   FLUoxetine  10 mg Oral Once per day on Mon Wed Fri   furosemide  80 mg Oral BID   insulin aspart  0-15 Units Subcutaneous TID WC   levothyroxine  75 mcg Oral QAC breakfast   metoprolol succinate  25 mg Oral Daily   multivitamin  1 tablet Oral QHS   sodium bicarbonate  650 mg Oral BID   Continuous Infusions:  sodium chloride     albumin human 25 g (06/23/23 0814)   anticoagulant sodium citrate     ferric gluconate (FERRLECIT) IVPB Stopped (06/23/23 1516)   PRN Meds:.sodium chloride, acetaminophen, albumin human, alteplase,  anticoagulant sodium citrate, heparin, heparin, methocarbamol, nitroGLYCERIN, ondansetron (ZOFRAN) IV, mouth rinse, oxyCODONE   Irena Cords,  MD 06/25/2023, 9:19 AM

## 2023-06-25 NOTE — Procedures (Signed)
HD Note:  Some information was entered later than the data was gathered due to patient care needs. The stated time with the data is accurate.  Received patient in bed to unit.  Alert and oriented.  Informed consent signed and in chart.   TX duration: 3.5  Patient tolerated well. Patient had cramping pain in both legs, she had just started with dialysis and stated it was not due to treatment.  Patient stated it has been since cardiac issues during hospitalization.  See MAR  Transported back to the room  Alert, without acute distress.  Hand-off given to patient's nurse.   Access used: Right upper chest HD catheter Access issues: No issues  Total UF removed: 2500 ml    [facility]

## 2023-06-25 NOTE — Progress Notes (Signed)
Called to room by patient.  States that her HD catheter  was bleeding.  I called the HD unit, in which Marcelino Duster, RN advised that I call IR.  I paged the on call IR doctor (McCollough).  He advised that I needed to hold pressure until the bleeding stopped and change the HD dressing.  Bleeding has stopped and dressing has been changed.  Patient is resting comfortably in bed with no complaints of discomfort.

## 2023-06-25 NOTE — Progress Notes (Signed)
OT Cancellation Note  Patient Details Name: Cassandra Baker MRN: [id number] DOB: [birth year]   Cancelled Treatment:    Reason Eval/Treat Not Completed: Medical issues which prohibited therapy.  Patient bleeding from dialysis port, OT to hold this date and continue efforts as appropriate.  Sharlot Sturkey D Karan Ramnauth 06/25/2023, 5:32 PM 06/25/2023  RP, OTR/L  Acute Rehabilitation Services  Office:  [phone]

## 2023-06-26 LAB — BPAM RBC
Blood Product Expiration Date: 202407242359
ISSUE DATE / TIME: 202407041309
Unit Type and Rh: 6200

## 2023-06-26 LAB — GLUCOSE, CAPILLARY
Glucose-Capillary: 123 mg/dL — ABNORMAL HIGH (ref 70–99)
Glucose-Capillary: 149 mg/dL — ABNORMAL HIGH (ref 70–99)
Glucose-Capillary: 133 mg/dL — ABNORMAL HIGH (ref 70–99)
Glucose-Capillary: 150 mg/dL — ABNORMAL HIGH (ref 70–99)

## 2023-06-26 LAB — TYPE AND SCREEN: ABO/RH(D): A POS

## 2023-06-26 MED ORDER — DARBEPOETIN ALFA 100 MCG/0.5ML IJ SOSY: 100.0000 ug | PREFILLED_SYRINGE | INTRAMUSCULAR | Status: DC

## 2023-06-26 MED ORDER — AMIODARONE HCL 200 MG PO TABS
200.0000 mg | ORAL_TABLET | Freq: Every day | ORAL | Status: DC
  Administered 2023-06-26 – 2023-06-28 (×3): 200 mg via ORAL
  Filled 2023-06-26 (×3): qty 1

## 2023-06-26 NOTE — Evaluation (Signed)
Physical Therapy Evaluation Patient Details Name: Cassandra Baker MRN: [id number] DOB: 1941/07/12 Today's Date: 06/26/2023  History of Present Illness  Pt admitted 6/25 for the evaluation of torsades de pointes and ventricular bigeminy. Recent NSTEMI with PCI.  also recent initiation of HD.  PMH: CAD, NSTEMI, DMT2, HTN, hypothyroidism.  Clinical Impression  Pt admitted with above diagnosis. Pt was able to ambulate with min guard assist with RW without LOB with good safety with RW. Definitely needs to use RW for safety and pt is aware.  Should progress well.  Will follow acutely.  Pt currently with functional limitations due to the deficits listed below (see PT Problem List). Pt will benefit from acute skilled PT to increase their independence and safety with mobility to allow discharge.           Assistance Recommended at Discharge PRN  If plan is discharge home, recommend the following:  Can travel by private vehicle  Assistance with cooking/housework;Assist for transportation;Help with stairs or ramp for entrance        Equipment Recommendations None recommended by PT  Recommendations for Other Services       Functional Status Assessment Patient has had a recent decline in their functional status and demonstrates the ability to make significant improvements in function in a reasonable and predictable amount of time.     Precautions / Restrictions Precautions Precautions: Fall Restrictions Weight Bearing Restrictions: No      Mobility  Bed Mobility Overal bed mobility: Modified Independent                  Transfers Overall transfer level: Needs assistance Equipment used: Rolling walker (2 wheels) Transfers: Sit to/from Stand Sit to Stand: Supervision           General transfer comment: cued for hand placement    Ambulation/Gait Ambulation/Gait assistance: Min guard Gait Distance (Feet): 200 Feet Assistive device: Rolling walker (2 wheels), Straight  cane Gait Pattern/deviations: Decreased step length - right, Decreased step length - left   Gait velocity interpretation: 1.31 - 2.62 ft/sec, indicative of limited community ambulator   General Gait Details: Pt able to ambulate into hallway with min guard assist and occasional cues to stay close to RW. Overall safety with RW good.  Stairs            Wheelchair Mobility     Tilt Bed    Modified Rankin (Stroke Patients Only)       Balance Overall balance assessment: Needs assistance Sitting-balance support: No upper extremity supported, Feet supported Sitting balance-Leahy Scale: Good     Standing balance support: Bilateral upper extremity supported, During functional activity Standing balance-Leahy Scale: Poor Standing balance comment: relies on UE support                             Pertinent Vitals/Pain Pain Assessment Pain Assessment: No/denies pain    Home Living Family/patient expects to be discharged to:: Private residence Living Arrangements: Alone Available Help at Discharge: Family;Available PRN/intermittently Type of Home: House Home Access: Stairs to enter Entrance Stairs-Rails: Right;Left;Can reach both Entrance Stairs-Number of Steps: 3 Alternate Level Stairs-Number of Steps: flight Home Layout: Two level Home Equipment: Cane - single point;Rolling Walker (2 wheels) (hiking stick,)      Prior Function Prior Level of Function : Independent/Modified Independent;Driving;History of Falls (last six months)             Mobility Comments: Pt states she  intermittently uses walking stick. Vague about how many falls she's had; states she has had them in her home due to clutter. Just bought a rollator and was using it recently ADLs Comments: (I) ADLs and driving and community IADLs.     Hand Dominance   Dominant Hand: Right    Extremity/Trunk Assessment   Upper Extremity Assessment Upper Extremity Assessment: Defer to OT evaluation     Lower Extremity Assessment Lower Extremity Assessment: Generalized weakness    Cervical / Trunk Assessment Cervical / Trunk Assessment: Normal  Communication   Communication: No difficulties  Cognition Arousal/Alertness: Awake/alert Behavior During Therapy: WFL for tasks assessed/performed Overall Cognitive Status: Within Functional Limits for tasks assessed                                 General Comments: Oriented; cooperative. Pt joking at times throughout session.        General Comments General comments (skin integrity, edema, etc.): VSS    Exercises     Assessment/Plan    PT Assessment Patient needs continued PT services  PT Problem List Decreased mobility;Decreased activity tolerance;Decreased balance;Decreased knowledge of use of DME       PT Treatment Interventions DME instruction;Therapeutic activities;Gait training;Therapeutic exercise;Stair training;Balance training;Patient/family education;Functional mobility training;Neuromuscular re-education    PT Goals (Current goals can be found in the Care Plan section)  Acute Rehab PT Goals Patient Stated Goal: to return to PLOF PT Goal Formulation: With patient Time For Goal Achievement: 07/10/23 Potential to Achieve Goals: Good    Frequency Min 1X/week     Co-evaluation               AM-PAC PT "6 Clicks" Mobility  Outcome Measure Help needed turning from your back to your side while in a flat bed without using bedrails?: None Help needed moving from lying on your back to sitting on the side of a flat bed without using bedrails?: A Little Help needed moving to and from a bed to a chair (including a wheelchair)?: A Little Help needed standing up from a chair using your arms (e.g., wheelchair or bedside chair)?: A Little Help needed to walk in hospital room?: A Little Help needed climbing 3-5 steps with a railing? : A Little 6 Click Score: 19    End of Session Equipment Utilized During  Treatment: Gait belt Activity Tolerance: Patient tolerated treatment well Patient left: with call bell/phone within reach;in chair Nurse Communication: Mobility status PT Visit Diagnosis: Other abnormalities of gait and mobility (R26.89);Muscle weakness (generalized) (M62.81)    Time: 9604-5409 PT Time Calculation (min) (ACUTE ONLY): 13 min   Charges:   PT Evaluation $PT Eval Moderate Complexity: 1 Mod   PT General Charges $$ ACUTE PT VISIT: 1 Visit         Rani Idler M,PT Acute Rehab Services [phone]   [name] 06/26/2023, 2:22 PM

## 2023-06-26 NOTE — Progress Notes (Addendum)
Local [facility] clinic is reviewing pt's case for a MWF schedule (per pt's request). Attempted to reach pt to provide update. Message left and requested a return call. Will assist as needed.   Olivia Canter Renal Navigator [phone]  Addendum at 2:02 pm: Pt has been accepted at [facility] on MWF 11:00 am chair time. Pt can start on Monday and will need to arrive at 10:15 to complete paperwork prior to treatment. Met with pt at bedside to discuss details. Schedule letter provided as well. Pt agreeable to plan. Arrangements added to AVS as well. Update provided to cardiology PA and nephrologist. Contacted renal PA to request that orders be sent to clinic at d/c. Will update TOC staff as well.

## 2023-06-26 NOTE — Progress Notes (Signed)
Seen pt to offer assistance with ambulation however pt declined ambulating at this time d/t tiredness. Will allow pt to have time to rest and f/u in the afternoon.   Cassandra Baker 06/26/2023 10:02 AM

## 2023-06-26 NOTE — Progress Notes (Signed)
   Discussed with Social Work- patient has been accepted at [facility] on [address]. Patient can start on Monday. Patient is scheduled for dialysis here tomorrow. Plan for discharge after HD tomorrow   Jonita Albee, PA-C 06/26/2023 3:52 PM

## 2023-06-26 NOTE — Evaluation (Signed)
Occupational Therapy Evaluation Patient Details Name: Cassandra Baker MRN: [id number] DOB: 1941-06-24 Today's Date: 06/26/2023   History of Present Illness Pt admitted 6/25 for the evaluation of torsades de pointes and ventricular bigeminy. Recent NSTEMI with PCI.  also recent initiation of HD.  PMH: CAD, NSTEMI, DMT2, HTN, hypothyroidism.   Clinical Impression   Patient admitted for the diagnosis above.  PTA she lives alone, and remains vey Ind with ADL, iADL, continues to drive, and uses a RW at times with mobility.  Patient presents with decreased activity tolerance, and generalized weakness, currently needing supervision for mobility and ADL completion from a sit to stand level.  OT will follow in the acute setting to address deficits, and HH OT could be considered, if the patient is interested.        Recommendations for follow up therapy are one component of a multi-disciplinary discharge planning process, led by the attending physician.  Recommendations may be updated based on patient status, additional functional criteria and insurance authorization.   Assistance Recommended at Discharge    Patient can return home with the following Assistance with cooking/housework;Assist for transportation    Functional Status Assessment  Patient has had a recent decline in their functional status and demonstrates the ability to make significant improvements in function in a reasonable and predictable amount of time.  Equipment Recommendations  Tub/shower seat    Recommendations for Other Services       Precautions / Restrictions Precautions Precautions: Fall Restrictions Weight Bearing Restrictions: No      Mobility Bed Mobility Overal bed mobility: Modified Independent                  Transfers Overall transfer level: Needs assistance Equipment used: Rolling walker (2 wheels) Transfers: Sit to/from Stand, Bed to chair/wheelchair/BSC Sit to Stand: Supervision     Step  pivot transfers: Supervision            Balance Overall balance assessment: Needs assistance Sitting-balance support: No upper extremity supported, Feet supported Sitting balance-Leahy Scale: Good     Standing balance support: Bilateral upper extremity supported, During functional activity Standing balance-Leahy Scale: Fair                             ADL either performed or assessed with clinical judgement   ADL       Grooming: Supervision/safety;Standing;Oral care;Brushing hair               Lower Body Dressing: Supervision/safety;Sit to/from stand               Functional mobility during ADLs: Supervision/safety;Rolling walker (2 wheels)       Vision Patient Visual Report: No change from baseline       Perception     Praxis      Pertinent Vitals/Pain Pain Assessment Pain Assessment: No/denies pain     Hand Dominance Right   Extremity/Trunk Assessment Upper Extremity Assessment Upper Extremity Assessment: Overall WFL for tasks assessed   Lower Extremity Assessment Lower Extremity Assessment: Defer to PT evaluation   Cervical / Trunk Assessment Cervical / Trunk Assessment: Normal   Communication Communication Communication: No difficulties   Cognition Arousal/Alertness: Awake/alert Behavior During Therapy: WFL for tasks assessed/performed Overall Cognitive Status: Within Functional Limits for tasks assessed  General Comments  VSS    Exercises     Shoulder Instructions      Home Living Family/patient expects to be discharged to:: Private residence Living Arrangements: Alone Available Help at Discharge: Family;Available PRN/intermittently Type of Home: House Home Access: Stairs to enter [employer] of Steps: 3 Entrance Stairs-Rails: Right;Left;Can reach both Home Layout: Two level Alternate Level Stairs-Number of Steps: flight Alternate Level  Stairs-Rails: Right Bathroom Shower/Tub: Chief Strategy Officer: Standard Bathroom Accessibility: Yes How Accessible: Accessible via walker Home Equipment: Cane - single point;Rolling Walker (2 wheels)          Prior Functioning/Environment Prior Level of Function : Independent/Modified Independent;Driving;History of Falls (last six months)             Mobility Comments: Pt states she intermittently uses walking stick. Vague about how many falls she's had; states she has had them in her home due to clutter. Just bought a rollator and was using it recently ADLs Comments: (I) ADLs and driving and community IADLs.        OT Problem List: Decreased activity tolerance;Impaired balance (sitting and/or standing)      OT Treatment/Interventions: Therapeutic exercise;Self-care/ADL training;Therapeutic activities;Patient/family education    OT Goals(Current goals can be found in the care plan section) Acute Rehab OT Goals Patient Stated Goal: Return home OT Goal Formulation: With patient Time For Goal Achievement: 07/10/23 Potential to Achieve Goals: Good ADL Goals Pt Will Perform Grooming: with modified independence;standing Pt Will Perform Lower Body Dressing: with modified independence;sit to/from stand Pt Will Transfer to Toilet: with modified independence;ambulating;regular height toilet  OT Frequency: Min 2X/week    Co-evaluation              AM-PAC OT "6 Clicks" Daily Activity     Outcome Measure Help from another person eating meals?: None Help from another person taking care of personal grooming?: A Little Help from another person toileting, which includes using toliet, bedpan, or urinal?: A Little Help from another person bathing (including washing, rinsing, drying)?: A Little Help from another person to put on and taking off regular upper body clothing?: None Help from another person to put on and taking off regular lower body clothing?: A Little 6  Click Score: 20   End of Session Equipment Utilized During Treatment: Rolling walker (2 wheels) Nurse Communication: Mobility status  Activity Tolerance: Patient tolerated treatment well Patient left: in chair;with call bell/phone within reach  OT Visit Diagnosis: Unsteadiness on feet (R26.81)                Time: 1308-6578 OT Time Calculation (min): 21 min Charges:  OT General Charges $OT Visit: 1 Visit OT Evaluation $OT Eval Moderate Complexity: 1 Mod  06/26/2023  RP, OTR/L  Acute Rehabilitation Services  Office:  [phone]   Suzanna Obey 06/26/2023, 2:33 PM

## 2023-06-26 NOTE — Progress Notes (Signed)
   Patient Name: Cassandra Baker Date of Encounter: 06/24/2023 [facility] Cardiologist: Julien Nordmann, MD   Interval Summary  .    No chest pain.   Vital Signs .    Vitals:   06/23/23 1621 06/23/23 2008 06/24/23 0005 06/24/23 0400  BP: (!) 133/41 (!) 99/44 (!) 89/33 (!) 120/57  Pulse: 67 70 66 74  Resp: 18 19 18 19   Temp:  98.8 F (37.1 C) 98.5 F (36.9 C) 98.8 F (37.1 C)  TempSrc:  Oral Oral Oral  SpO2: 97% 90% 97% 96%  Weight:    69.9 kg  Height:        Intake/Output Summary (Last 24 hours) at 06/24/2023 0734 Last data filed at 06/24/2023 0300 Gross per 24 hour  Intake 400 ml  Output 2300 ml  Net -1900 ml      06/24/2023    4:00 AM 06/23/2023   11:45 AM 06/23/2023    7:45 AM  Last 3 Weights  Weight (lbs) 154 lb 1.6 oz 154 lb 1.6 oz 157 lb 10.1 oz  Weight (kg) 69.9 kg 69.9 kg 71.5 kg        Latest Ref Rng & Units 06/25/2023    6:52 PM 06/25/2023    9:23 AM 06/23/2023   12:43 AM  CBC  WBC 4.0 - 10.5 K/uL  5.8  12.9   Hemoglobin 12.0 - 15.0 g/dL 16.1  6.3  C 9.9   Hematocrit 36.0 - 46.0 % 32.5  20.7  31.2   Platelets 150 - 400 K/uL  96  170     C Corrected result     Telemetry/ECG    sinus - Personally Reviewed  Physical Exam .    General: Well developed, well nourished, NAD  HEENT: OP clear, mucus membranes moist  SKIN: warm, dry. No rashes. Neuro: No focal deficits  Musculoskeletal: Muscle strength 5/5 all ext  Psychiatric: Mood and affect normal  Neck: No JVD Lungs:Clear bilaterally, no wheezes, rhonci, crackles Cardiovascular: Regular rate and rhythm. No murmurs, gallops or rubs. Abdomen:Soft. Bowel sounds present. Non-tender.  Extremities: No lower extremity edema. .   Assessment & Plan .     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 No chest pain over the past few days. Will continue DAPT with ASA and Plavix. Continue statin and beta blocker.     Polymorphic Ventricular tachycardia:  Patient went into polymorphic VT for 10 seconds prior to RCA PCI on 06/17/23. She converted spontaneously and did not require defibrillation. This was felt to be due to ongoing ischemia prior to her PCI. Continue amiodarone and Toprol.   Chronic combined systolic and diastolic heart failure: volume control per HD. 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 Planning for outpatient dialysis site. Appreciate Nephrology assistance.   Anemia of chronic disease vs acute on chronic anemia: Pt received pRBCs yesterday. H/H pending this am.   Dispo: Awaiting planning for dialysis site per Nephrology team. OT/PT consult today. Possible discharge to SNF  For questions or updates, please contact  [facility] Please consult [email or web address] for contact info under      Signed, Verne Carrow, MD , [facility] 06/24/2023 7:34 AM

## 2023-06-26 NOTE — Progress Notes (Signed)
Patient ID: Cassandra Baker, female   DOB: 08-30-1941, 82 y.o.   MRN: [id number] S: no new complaints.  Reports left hand feels numb but is getting better O:BP (!) 132/56 (BP Location: Right Arm)   Pulse 69   Temp 97.7 F (36.5 C) (Oral)   Resp 19   Ht 5\' 4"  (1.626 m)   Wt 72.6 kg   LMP  (LMP Unknown)   SpO2 95%   BMI 27.47 kg/m   Intake/Output Summary (Last 24 hours) at 06/26/2023 1007 Last data filed at 06/26/2023 0800 Gross per 24 hour  Intake 935 ml  Output 2700 ml  Net -1765 ml   Intake/Output: I/O last 3 completed shifts: In: 1115 [P.O.:800; Blood:315] Out: 2700 [Urine:200; Other:2500]  Intake/Output this shift:  Total I/O In: 60 [P.O.:60] Out: -  Weight change:  Gen: NAD CVS: RRR Resp:CTA Abd:+BS, soft, NT/ND Ext: 1+ pretibial edema bilaterally, LUE AVF +T/B  Recent Labs  Lab 06/20/23 0839 06/22/23 1136 06/23/23 0043 06/25/23 0919  NA 127* 127* 125* 127*  K 3.8 4.3 4.9 4.1  CL 88* 91* 89* 88*  CO2 25  --  25 26  GLUCOSE 105* 108* 185* 210*  BUN 33* 37* 40* 46*  CREATININE 4.07* 4.50* 5.03* 4.44*  ALBUMIN 2.9*  --   --  2.9*  CALCIUM 9.7  --  9.8 9.4  PHOS 4.3  --   --  4.4   Liver Function Tests: Recent Labs  Lab 06/20/23 0839 06/25/23 0919  ALBUMIN 2.9* 2.9*   No results for input(s): "LIPASE", "AMYLASE" in the last 168 hours. No results for input(s): "AMMONIA" in the last 168 hours. CBC: Recent Labs  Lab 06/20/23 0047 06/21/23 0106 06/22/23 0108 06/22/23 1136 06/23/23 0043 06/25/23 0923 06/25/23 1852  WBC 8.4 9.0 9.5  --  12.9* 5.8  --   HGB 9.4* 9.4* 9.5*   < > 9.9* 6.3* 10.3*  HCT 29.0* 30.0* 29.9*   < > 31.2* 20.7* 32.5*  MCV 93.9 95.5 96.1  --  95.7 100.5*  --   PLT 127* 130* 140*  --  170 96*  --    < > = values in this interval not displayed.   Cardiac Enzymes: No results for input(s): "CKTOTAL", "CKMB", "CKMBINDEX", "TROPONINI" in the last 168 hours. CBG: Recent Labs  Lab 06/24/23 2159 06/25/23 0610 06/25/23 1559  06/25/23 2113 06/26/23 0610  GLUCAP 143* 114* 166* 100* 123*    Iron Studies: No results for input(s): "IRON", "TIBC", "TRANSFERRIN", "FERRITIN" in the last 72 hours. Studies/Results: No results found.  amiodarone  200 mg Oral Daily   aspirin EC  81 mg Oral Daily   atorvastatin  80 mg Oral Daily   calcium acetate  667 mg Oral TID WC   Chlorhexidine Gluconate Cloth  6 each Topical Q0600   clopidogrel  75 mg Oral Daily   darbepoetin (ARANESP) injection - DIALYSIS  60 mcg Subcutaneous Q Thu-1800   feeding supplement (NEPRO CARB STEADY)  237 mL Oral BID BM   FLUoxetine  10 mg Oral Once per day on Mon Wed Fri   furosemide  80 mg Oral BID   insulin aspart  0-15 Units Subcutaneous TID WC   levothyroxine  75 mcg Oral QAC breakfast   metoprolol succinate  25 mg Oral Daily   multivitamin  1 tablet Oral QHS    BMET    Component Value Date/Time   NA 127 (L) 06/25/2023 0919   NA 136 03/03/2014 1004  K 4.1 06/25/2023 0919   K 4.4 03/03/2014 1004   CL 88 (L) 06/25/2023 0919   CL 106 03/03/2014 1004   CO2 26 06/25/2023 0919   CO2 25 03/03/2014 1004   GLUCOSE 210 (H) 06/25/2023 0919   GLUCOSE 212 (H) 03/03/2014 1004   BUN 46 (H) 06/25/2023 0919   BUN 44 (H) 03/03/2014 1004   CREATININE 4.44 (H) 06/25/2023 0919   CREATININE 1.86 (H) 03/03/2014 1004   CALCIUM 9.4 06/25/2023 0919   CALCIUM 9.5 03/03/2014 1004   GFRNONAA 9 (L) 06/25/2023 0919   GFRNONAA 26 (L) 03/03/2014 1004   GFRAA 23 (L) 02/18/2018 1424   GFRAA 31 (L) 03/03/2014 1004   CBC    Component Value Date/Time   WBC 5.8 06/25/2023 0923   RBC 2.06 (L) 06/25/2023 0923   HGB 10.3 (L) 06/25/2023 1852   HGB 12.0 03/03/2014 1004   HCT 32.5 (L) 06/25/2023 1852   HCT 35.9 03/03/2014 1004   PLT 96 (L) 06/25/2023 0923   PLT 177 03/03/2014 1004   MCV 100.5 (H) 06/25/2023 0923   MCV 84 03/03/2014 1004   MCH 30.6 06/25/2023 0923   MCHC 30.4 06/25/2023 0923   RDW 16.1 (H) 06/25/2023 0923   RDW 15.3 (H) 03/03/2014 1004    LYMPHSABS 0.7 06/13/2023 0433   MONOABS 0.8 06/13/2023 0433   EOSABS 0.3 06/13/2023 0433   BASOSABS 0.1 06/13/2023 0433    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 Allena Katz. PMH: CAD, NSTEMI, DMT2, HTN, hypothyroidism.    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 Had 1st HD 6/21 and 2nd HD 6/24 (shortened to 1 hr by pt) at [facility]. Had 3rd HD 6/25 and the 4th HD session on 6/27 here.  Next HD 06/27/2023.  Will plan for HD in recliner as she feels like she is "in prison" in the bed.  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 Likely mediated by ischemia. Cardiology following. 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 G1DD. Appears euvolemic. Optimize volume with HD.  Vascular access: VVS consulted for permanent access. S/p RIJ TDC and left BC AVF creation 06/22/23 by Dr. Karin Lieu. Temp femoral temporary cath removed 06/22/23.  NSTEMI/ CAD - w/ heart cath showing 3V CAD done at [facility]. Underwent PCI to RCA on 06/18/2023. Cardiology following.   HTN/ volume -BP elevated today. No evidence of volume overload by exam. UF as tolerated.    Hyponatremia - changing to renal diet w/ fluid restriction 1200 cc/d. Na+ 127 today.  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 Tsat done 24%, ferritin 152. Started darbe 60 mcg sq weekly while here.  Hgb dropped to 6.3 and received blood transfusion 06/25/23 with increase to 10.3.  Will increase aranesp to 100 mcg q week MBD ckd - CCa in range. Getting phoslo at 1 ac tid. F/u phos improving w/ hd and binders.  Get pth.  A/C systolic CHF - no vol overload. Echo showed LVEF 30-35%.  DM2 - per primary svc Hypothyroid Anxiety/ depression EColi UTI Disposition - awaiting finalization of outpatient HD.  She was accepted at [facility] but on TTS schedule and she is awaiting any MWF shifts available here in [place].    Irena Cords, MD [facility]

## 2023-06-27 ENCOUNTER — Telehealth: Payer: Self-pay | Admitting: Physician Assistant

## 2023-06-27 DIAGNOSIS — I5020 Unspecified systolic (congestive) heart failure: Secondary | ICD-10-CM

## 2023-06-27 DIAGNOSIS — Z992 Dependence on renal dialysis: Secondary | ICD-10-CM

## 2023-06-27 LAB — RENAL FUNCTION PANEL
Albumin: 3.1 g/dL — ABNORMAL LOW (ref 3.5–5.0)
Anion gap: 12 (ref 5–15)
BUN: 43 mg/dL — ABNORMAL HIGH (ref 8–23)
CO2: 25 mmol/L (ref 22–32)
Calcium: 9.7 mg/dL (ref 8.9–10.3)
Chloride: 84 mmol/L — ABNORMAL LOW (ref 98–111)
Creatinine, Ser: 4.07 mg/dL — ABNORMAL HIGH (ref 0.44–1.00)
GFR, Estimated: 10 mL/min — ABNORMAL LOW (ref >60)
Glucose, Bld: 196 mg/dL — ABNORMAL HIGH (ref 70–99)
Phosphorus: 4.3 mg/dL (ref 2.5–4.6)
Potassium: 4.3 mmol/L (ref 3.5–5.1)
Sodium: 121 mmol/L — ABNORMAL LOW (ref 135–145)

## 2023-06-27 LAB — CBC
HCT: 31.3 % — ABNORMAL LOW (ref 36.0–46.0)
Hemoglobin: 10.1 g/dL — ABNORMAL LOW (ref 12.0–15.0)
MCH: 31.2 pg (ref 26.0–34.0)
MCHC: 32.3 g/dL (ref 30.0–36.0)
MCV: 96.6 fL (ref 80.0–100.0)
Platelets: 127 10*3/uL — ABNORMAL LOW (ref 150–400)
RBC: 3.24 MIL/uL — ABNORMAL LOW (ref 3.87–5.11)
RDW: 16 % — ABNORMAL HIGH (ref 11.5–15.5)
WBC: 8.5 10*3/uL (ref 4.0–10.5)
nRBC: 0 % (ref 0.0–0.2)

## 2023-06-27 LAB — GLUCOSE, CAPILLARY
Glucose-Capillary: 141 mg/dL — ABNORMAL HIGH (ref 70–99)
Glucose-Capillary: 147 mg/dL — ABNORMAL HIGH (ref 70–99)
Glucose-Capillary: 242 mg/dL — ABNORMAL HIGH (ref 70–99)

## 2023-06-27 MED ORDER — NITROGLYCERIN 0.4 MG SL SUBL: 0.4000 mg | SUBLINGUAL_TABLET | SUBLINGUAL | 3 refills | Status: DC | PRN

## 2023-06-27 MED ORDER — AMIODARONE HCL 200 MG PO TABS: 200.0000 mg | ORAL_TABLET | Freq: Every day | ORAL | 3 refills | Status: DC

## 2023-06-27 MED ORDER — CLOPIDOGREL BISULFATE 75 MG PO TABS: 75.0000 mg | ORAL_TABLET | Freq: Every day | ORAL | 3 refills | Status: DC

## 2023-06-27 NOTE — Progress Notes (Signed)
Cardiology on call notified of pt arrival to the floor. Hemodialysis completed at this time, but pt stated that she does not have transportation, and cannot go home before morning. MD notified.

## 2023-06-27 NOTE — Telephone Encounter (Signed)
   Transition of Care Follow-up Phone Call Request    Patient Name: BEUNKA MALPASS Date of Birth: [birth year] Date of Encounter: 06/27/2023  Primary Care Provider:  Renford Dills, MD Primary Cardiologist:  Julien Nordmann, MD  [name] has been scheduled for a transition of care follow up appointment with a [facility] provider:  Jari Favre PA-C  Please reach out to [name] within 48 hours to confirm appointment and review transition of care protocol questionnaire.  [place], [place]  06/27/2023, 12:58 PM

## 2023-06-27 NOTE — Progress Notes (Signed)
Patient ID: Cassandra Baker, female   DOB: 1941-01-31, 82 y.o.   MRN: [id number] S:Complaining of lower extremity swelling. O:BP (!) 137/54 (BP Location: Right Arm)   Pulse 69   Temp 97.9 F (36.6 C) (Oral)   Resp 18   Ht 5\' 4"  (1.626 m)   Wt 73.9 kg   LMP  (LMP Unknown)   SpO2 96%   BMI 27.98 kg/m   Intake/Output Summary (Last 24 hours) at 06/27/2023 1033 Last data filed at 06/27/2023 1022 Gross per 24 hour  Intake 600 ml  Output 250 ml  Net 350 ml   Intake/Output: I/O last 3 completed shifts: In: 380 [P.O.:380] Out: 450 [Urine:450]  Intake/Output this shift:  Total I/O In: 480 [P.O.:480] Out: -  Weight change: 1.336 kg Gen: NAD CVS: RRR Resp:CTA Abd: +BS, soft, NT/ND Ext: 1+ pretibial edema, LUE AVF +T/B  Recent Labs  Lab 06/22/23 1136 06/23/23 0043 06/25/23 0919  NA 127* 125* 127*  K 4.3 4.9 4.1  CL 91* 89* 88*  CO2  --  25 26  GLUCOSE 108* 185* 210*  BUN 37* 40* 46*  CREATININE 4.50* 5.03* 4.44*  ALBUMIN  --   --  2.9*  CALCIUM  --  9.8 9.4  PHOS  --   --  4.4   Liver Function Tests: Recent Labs  Lab 06/25/23 0919  ALBUMIN 2.9*   No results for input(s): "LIPASE", "AMYLASE" in the last 168 hours. No results for input(s): "AMMONIA" in the last 168 hours. CBC: Recent Labs  Lab 06/21/23 0106 06/22/23 0108 06/22/23 1136 06/23/23 0043 06/25/23 0923 06/25/23 1852  WBC 9.0 9.5  --  12.9* 5.8  --   HGB 9.4* 9.5*   < > 9.9* 6.3* 10.3*  HCT 30.0* 29.9*   < > 31.2* 20.7* 32.5*  MCV 95.5 96.1  --  95.7 100.5*  --   PLT 130* 140*  --  170 96*  --    < > = values in this interval not displayed.   Cardiac Enzymes: No results for input(s): "CKTOTAL", "CKMB", "CKMBINDEX", "TROPONINI" in the last 168 hours. CBG: Recent Labs  Lab 06/26/23 0610 06/26/23 1111 06/26/23 1622 06/26/23 2117 06/27/23 0554  GLUCAP 123* 149* 133* 150* 141*    Iron Studies: No results for input(s): "IRON", "TIBC", "TRANSFERRIN", "FERRITIN" in the last 72  hours. Studies/Results: No results found.  amiodarone  200 mg Oral Daily   aspirin EC  81 mg Oral Daily   atorvastatin  80 mg Oral Daily   calcium acetate  667 mg Oral TID WC   Chlorhexidine Gluconate Cloth  6 each Topical Q0600   clopidogrel  75 mg Oral Daily   [START ON 07/02/2023] darbepoetin (ARANESP) injection - DIALYSIS  100 mcg Subcutaneous Q Thu-1800   feeding supplement (NEPRO CARB STEADY)  237 mL Oral BID BM   FLUoxetine  10 mg Oral Once per day on Mon Wed Fri   furosemide  80 mg Oral BID   insulin aspart  0-15 Units Subcutaneous TID WC   levothyroxine  75 mcg Oral QAC breakfast   metoprolol succinate  25 mg Oral Daily   multivitamin  1 tablet Oral QHS    BMET    Component Value Date/Time   NA 127 (L) 06/25/2023 0919   NA 136 03/03/2014 1004   K 4.1 06/25/2023 0919   K 4.4 03/03/2014 1004   CL 88 (L) 06/25/2023 0919   CL 106 03/03/2014 1004   CO2 26 06/25/2023  0919   CO2 25 03/03/2014 1004   GLUCOSE 210 (H) 06/25/2023 0919   GLUCOSE 212 (H) 03/03/2014 1004   BUN 46 (H) 06/25/2023 0919   BUN 44 (H) 03/03/2014 1004   CREATININE 4.44 (H) 06/25/2023 0919   CREATININE 1.86 (H) 03/03/2014 1004   CALCIUM 9.4 06/25/2023 0919   CALCIUM 9.5 03/03/2014 1004   GFRNONAA 9 (L) 06/25/2023 0919   GFRNONAA 26 (L) 03/03/2014 1004   GFRAA 23 (L) 02/18/2018 1424   GFRAA 31 (L) 03/03/2014 1004   CBC    Component Value Date/Time   WBC 5.8 06/25/2023 0923   RBC 2.06 (L) 06/25/2023 0923   HGB 10.3 (L) 06/25/2023 1852   HGB 12.0 03/03/2014 1004   HCT 32.5 (L) 06/25/2023 1852   HCT 35.9 03/03/2014 1004   PLT 96 (L) 06/25/2023 0923   PLT 177 03/03/2014 1004   MCV 100.5 (H) 06/25/2023 0923   MCV 84 03/03/2014 1004   MCH 30.6 06/25/2023 0923   MCHC 30.4 06/25/2023 0923   RDW 16.1 (H) 06/25/2023 0923   RDW 15.3 (H) 03/03/2014 1004   LYMPHSABS 0.7 06/13/2023 0433   MONOABS 0.8 06/13/2023 0433   EOSABS 0.3 06/13/2023 0433   BASOSABS 0.1 06/13/2023 0433     New ESRD disease  patient followed at CKD by Dr. Allena Katz. PMH: CAD, NSTEMI, DMT2, HTN, hypothyroidism.    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 Had 1st HD 6/21 and 2nd HD 6/24 (shortened to 1 hr by pt) at [facility]. Had 3rd HD 6/25 and the 4th HD session on 6/27 here.  Next HD 06/27/2023.  Will plan for HD in recliner as she feels like she is "in prison" in the bed.  She will now be on MWF schedule at [facility] starting 06/29/23.  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 Likely mediated by ischemia. Cardiology following. 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 G1DD. Appears euvolemic. Optimize volume with HD.  Vascular access: VVS consulted for permanent access. S/p RIJ TDC and left BC AVF creation 06/22/23 by Dr. Karin Lieu. Temp femoral temporary cath removed 06/22/23.  NSTEMI/ CAD - w/ heart cath showing 3V CAD done at [facility]. Underwent PCI to RCA on 06/18/2023. Cardiology following.   HTN/ volume -BP elevated today. No evidence of volume overload by exam. UF as tolerated.    Hyponatremia - changing to renal diet w/ fluid restriction 1200 cc/d. Na+ 127 today.  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 Tsat done 24%, ferritin 152. Started darbe 60 mcg sq weekly while here.  Hgb dropped to 6.3 and received blood transfusion 06/25/23 with increase to 10.3.  Will increase aranesp to 100 mcg q week MBD ckd - CCa in range. Getting phoslo at 1 ac tid. F/u phos improving w/ hd and binders.  Get pth.  A/C systolic CHF - no vol overload. Echo showed LVEF 30-35%.  DM2 - per primary svc Hypothyroid Anxiety/ depression EColi UTI Disposition -  She was accepted at [facility] on MWF schedule.  Chair time is 11:00 am and will need to be there 10:15 on 06/29/23.  Stable for discharge from renal standpoint after HD today.  Irena Cords, MD [facility]

## 2023-06-27 NOTE — Progress Notes (Signed)
Pt returned to 3E24 from HD.

## 2023-06-27 NOTE — Progress Notes (Signed)
Progress Note  Patient Name: Cassandra Baker Date of Encounter: 06/27/2023  Primary Cardiologist: Julien Nordmann, MD  Interval Summary   No chest pain or shortness of breath.  No palpitations or dizziness.  Awaits HD this morning.  Vital Signs    Vitals:   06/26/23 1506 06/26/23 2034 06/27/23 0058 06/27/23 0511  BP: (!) 110/47 (!) 117/47 104/64 (!) 137/54  Pulse: (!) 59 (!) 56 62 66  Resp: 16 17 17 17   Temp: 98 F (36.7 C) 97.6 F (36.4 C) (!) 97.4 F (36.3 C) 97.6 F (36.4 C)  TempSrc: Oral Oral Oral Oral  SpO2: 99% 100% 99% 94%  Weight:    73.9 kg  Height:        Intake/Output Summary (Last 24 hours) at 06/27/2023 0929 Last data filed at 06/27/2023 0600 Gross per 24 hour  Intake 120 ml  Output 250 ml  Net -130 ml   Filed Weights   06/24/23 0400 06/26/23 0405 06/27/23 0511  Weight: 69.9 kg 72.6 kg 73.9 kg    Physical Exam   GEN: No acute distress.   Neck: No JVD. Cardiac: RRR, no gallop.  Respiratory: Nonlabored. Clear to auscultation bilaterally. GI: Soft, nontender, bowel sounds present. MS: Mild lower leg edema. Neuro:  Nonfocal. Psych: Alert and oriented x 3. Normal affect.  ECG/Telemetry    Telemetry reviewed showing sinus rhythm.  Labs    Chemistry Recent Labs  Lab 06/22/23 1136 06/23/23 0043 06/25/23 0919  NA 127* 125* 127*  K 4.3 4.9 4.1  CL 91* 89* 88*  CO2  --  25 26  GLUCOSE 108* 185* 210*  BUN 37* 40* 46*  CREATININE 4.50* 5.03* 4.44*  CALCIUM  --  9.8 9.4  ALBUMIN  --   --  2.9*  GFRNONAA  --  8* 9*  ANIONGAP  --  11 13    Hematology Recent Labs  Lab 06/22/23 0108 06/22/23 1136 06/23/23 0043 06/25/23 0923 06/25/23 1852  WBC 9.5  --  12.9* 5.8  --   RBC 3.11*  --  3.26* 2.06*  --   HGB 9.5*   < > 9.9* 6.3* 10.3*  HCT 29.9*   < > 31.2* 20.7* 32.5*  MCV 96.1  --  95.7 100.5*  --   MCH 30.5  --  30.4 30.6  --   MCHC 31.8  --  31.7 30.4  --   RDW 15.3  --  15.3 16.1*  --   PLT 140*  --  170 96*  --    < > = values in  this interval not displayed.   Cardiac Enzymes Recent Labs  Lab 06/03/23 2353 06/04/23 0145 06/05/23 1421 06/15/23 2237 06/16/23 0013  TROPONINIHS 986* 998* 993* 180* 209*   Lipid Panel     Component Value Date/Time   CHOL 157 06/07/2023 0458   TRIG 129 06/07/2023 0458   HDL 61 06/07/2023 0458   CHOLHDL 2.6 06/07/2023 0458   VLDL 26 06/07/2023 0458   LDLCALC 70 06/07/2023 0458    Cardiac Studies   Echocardiogram 06/04/2023:  1. Left ventricular ejection fraction, by estimation, is 30 to 35%. The  left ventricle has moderately decreased function. The left ventricle  demonstrates mild global hypokinesis with severe hypokinesis of the  inferior/posterior/septal wall. There is  mild left ventricular hypertrophy. Left ventricular diastolic parameters  are consistent with Grade I diastolic dysfunction (impaired relaxation).   2. Right ventricular systolic function is normal. The right ventricular  size is  normal.   3. The mitral valve is normal in structure. Moderate mitral valve  regurgitation. No evidence of mitral stenosis. Moderate mitral annular  calcification.   4. The aortic valve is normal in structure. Aortic valve regurgitation is  not visualized. No aortic stenosis is present. Aortic valve mean gradient  measures 8.0 mmHg.   5. The inferior vena cava is normal in size with greater than 50%  respiratory variability, suggesting right atrial pressure of 3 mmHg.   Cardiac catheterization 06/17/2023:   Mid LAD lesion is 80% stenosed.   Mid Cx to Dist Cx lesion is 90% stenosed.   Ramus lesion is 60% stenosed.   RPDA lesion is 90% stenosed.   Prox RCA lesion is 80% stenosed.   Prox RCA to Mid RCA lesion is 99% stenosed.   Mid RCA lesion is 70% stenosed.   RPAV lesion is 60% stenosed.   A drug-eluting stent was successfully placed using a SYNERGY XD 3.0X48.   A drug-eluting stent was successfully placed using a SYNERGY XD 3.50X16.   Post intervention, there is a 0%  residual stenosis.   Post intervention, there is a 0% residual stenosis.   Post intervention, there is a 0% residual stenosis.   Moderately to severely elevated left ventricular end-diastolic pressure at 32 mmHg. 2. Successful complex intravascular lithotripsy and 2 overlapped drug-eluting stent placement to the mid and proximal right coronary artery.  Very difficult procedure overall due to heavy calcifications and difficulty crossing the stenosis in the mid right coronary artery.  The distal RCA bifurcation disease appeared better than before likely due to improved upstream flow and thus I elected not to treat. 3.  The patient did go into polymorphic ventricular tachycardia that lasted for about 10 seconds before starting the procedure.  She converted and did not require defibrillation. 4.  Difficulty sedating the patient with continuous involuntary movements of her lower extremities throughout the case.  Assessment & Plan   1.  Multivessel CAD with NSTEMI, peak high-sensitivity troponin I 998.  Culprit lesion felt to be RCA status post complex PCI with DES x 2 on June 26 and plan to manage residual disease medically.  2.  HFrEF with ischemic cardiomyopathy, LVEF 30 to 35% by echocardiogram in June.  GDMT limited in the setting of ESRD.  Currently on Toprol-XL and Lasix.  Fluid status being optimized through hemodialysis.  3.  Status post polymorphic VT, limited and not requiring defibrillation felt to be ischemia driven.  Telemetry stable on Toprol-XL and amiodarone.  4.  ESRD on hemodialysis.  Chart reviewed including plan for outpatient hemodialysis sessions.  She will complete hemodialysis this morning prior to discharge.  5.  Acute on chronic anemia, hemoglobin improved following PRBCs.  Likely mixed picture and of chronic disease, no acute bleeding source evident at this time.  Nephrology is following as well with plan to increase Aranesp to 100 mcg weekly.  6.  Essential  hypertension.  7.  Type 2 diabetes mellitus.  Chart reviewed including plan for discharge today after hemodialysis.  Patient states that she will be staying with her son in [place] temporarily (she lives in [place]).  We will arrange a 7-day TOC visit.  Continue aspirin, Plavix, amiodarone, Lipitor, Toprol-XL, and Lasix.  Outpatient hemodialysis set up as well per description in chart.  For questions or updates, please contact [facility] Please consult [email or web address] for contact info under   Signed, Nona Dell, MD  06/27/2023, 9:29 AM

## 2023-06-27 NOTE — Plan of Care (Signed)
[facility]  Initial Hemodialysis Orders  Dialysis center: [facility]  Patient's name: Cassandra Baker DOB: 22-Oct-1941 ESRD   Discharge diagnosis: CKD V -> ESRD  NSTEMI/CAD s/p PCI  HFrEF 30-35%  Acute/Chronic anemia s/p prbcs 06/25/23   Allergies:  Allergies  Allergen Reactions   Plum Pulp Anaphylaxis and Cough   Almond (Diagnostic) Itching   Other Itching    Dairy products/Soaps & detergents   Peanut-Containing Drug Products Itching   Penicillins Rash    Tolerates ceftriaxone    Date of First Dialysis: 06/12/23  Cause of renal disease: --  Dialysis Prescription: Dialysis Frequency: TIW Tx duration: 4:00 BFR: 400 DFR: 600  EDW: 69 kg  (Challenge as tolerated)  Dialyzer: 180NRe UF profile/Sodium modeling?: -- Dialysis Bath: 2 K 2 Ca  Dialysis access: -R internal jugular TDC / L arm 1st stage BVT placed 06/22/23 by Dr. Karin Lieu   In Center Medications: Heparin Dose: --  Type: -- VDRA: per clinic protocol  Venofer:  Mircera: 100 mcg q 2 weeks  Next dose due: 7/10 Sensipar: --  Discharge labs: Hgb: 10.3 K+: 4.1         Ca: 9.4     Phos: 4.4 Alb: 2.9  Please draw routine labs. Check monthly labs on admission  Additional notes/follow-up: --  Tomasa Blase PA-C [facility] 06/27/2023,2:35 PM

## 2023-06-28 DIAGNOSIS — I25110 Atherosclerotic heart disease of native coronary artery with unstable angina pectoris: Secondary | ICD-10-CM

## 2023-06-28 LAB — GLUCOSE, CAPILLARY
Glucose-Capillary: 175 mg/dL — ABNORMAL HIGH (ref 70–99)
Glucose-Capillary: 134 mg/dL — ABNORMAL HIGH (ref 70–99)

## 2023-06-28 NOTE — Plan of Care (Signed)
Problem: Education: Goal: Ability to describe self-care measures that may prevent or decrease complications (Diabetes Survival Skills Education) will improve Outcome: Progressing Goal: Individualized Educational Video(s) Outcome: Progressing   Problem: Coping: Goal: Ability to adjust to condition or change in health will improve Outcome: Progressing   Problem: Fluid Volume: Goal: Ability to maintain a balanced intake and output will improve Outcome: Progressing   Problem: Health Behavior/Discharge Planning: Goal: Ability to identify and utilize available resources and services will improve Outcome: Progressing Goal: Ability to manage health-related needs will improve Outcome: Progressing   Problem: Metabolic: Goal: Ability to maintain appropriate glucose levels will improve Outcome: Progressing   Problem: Nutritional: Goal: Maintenance of adequate nutrition will improve Outcome: Progressing Goal: Progress toward achieving an optimal weight will improve Outcome: Progressing   Problem: Skin Integrity: Goal: Risk for impaired skin integrity will decrease Outcome: Progressing   Problem: Tissue Perfusion: Goal: Adequacy of tissue perfusion will improve Outcome: Progressing   Problem: Education: Goal: Understanding of cardiac disease, CV risk reduction, and recovery process will improve Outcome: Progressing Goal: Individualized Educational Video(s) Outcome: Progressing   Problem: Activity: Goal: Ability to tolerate increased activity will improve Outcome: Progressing   Problem: Cardiac: Goal: Ability to achieve and maintain adequate cardiovascular perfusion will improve Outcome: Progressing   Problem: Health Behavior/Discharge Planning: Goal: Ability to safely manage health-related needs after discharge will improve Outcome: Progressing   Problem: Education: Goal: Understanding of CV disease, CV risk reduction, and recovery process will improve Outcome:  Progressing Goal: Individualized Educational Video(s) Outcome: Progressing   Problem: Activity: Goal: Ability to return to baseline activity level will improve Outcome: Progressing   Problem: Cardiovascular: Goal: Ability to achieve and maintain adequate cardiovascular perfusion will improve Outcome: Progressing Goal: Vascular access site(s) Level 0-1 will be maintained Outcome: Progressing   Problem: Health Behavior/Discharge Planning: Goal: Ability to safely manage health-related needs after discharge will improve Outcome: Progressing   Problem: Education: Goal: Knowledge of General Education information will improve Description: Including pain rating scale, medication(s)/side effects and non-pharmacologic comfort measures Outcome: Progressing   Problem: Health Behavior/Discharge Planning: Goal: Ability to manage health-related needs will improve Outcome: Progressing   Problem: Clinical Measurements: Goal: Ability to maintain clinical measurements within normal limits will improve Outcome: Progressing Goal: Will remain free from infection Outcome: Progressing Goal: Diagnostic test results will improve Outcome: Progressing Goal: Respiratory complications will improve Outcome: Progressing Goal: Cardiovascular complication will be avoided Outcome: Progressing   Problem: Activity: Goal: Risk for activity intolerance will decrease Outcome: Progressing   Problem: Nutrition: Goal: Adequate nutrition will be maintained Outcome: Progressing   Problem: Coping: Goal: Level of anxiety will decrease Outcome: Progressing   Problem: Elimination: Goal: Will not experience complications related to bowel motility Outcome: Progressing Goal: Will not experience complications related to urinary retention Outcome: Progressing   Problem: Pain Managment: Goal: General experience of comfort will improve Outcome: Progressing   Problem: Safety: Goal: Ability to remain free from  injury will improve Outcome: Progressing   Problem: Skin Integrity: Goal: Risk for impaired skin integrity will decrease Outcome: Progressing   Problem: Education: Goal: Knowledge of General Education information will improve Description: Including pain rating scale, medication(s)/side effects and non-pharmacologic comfort measures Outcome: Progressing   Problem: Health Behavior/Discharge Planning: Goal: Ability to manage health-related needs will improve Outcome: Progressing   Problem: Clinical Measurements: Goal: Ability to maintain clinical measurements within normal limits will improve Outcome: Progressing Goal: Will remain free from infection Outcome: Progressing Goal: Diagnostic test results will improve Outcome: Progressing Goal:   Respiratory complications will improve Outcome: Progressing Goal: Cardiovascular complication will be avoided Outcome: Progressing   Problem: Activity: Goal: Risk for activity intolerance will decrease Outcome: Progressing   

## 2023-06-28 NOTE — TOC Transition Note (Addendum)
Transition of Care [facility]) - CM/SW Discharge Note   Patient Details  Name: Cassandra Baker MRN: [id number] Date of Birth: 10-26-1941  [facility] CM/SW Contact:  Lawerance Sabal, RN Phone Number: 06/28/2023, 11:23 AM   Clinical Narrative:     Cassandra Baker w patient at bedside. She states that she will go to stay with her son in [place] to recuperate for a while before she goes back home. She states that she has all needed DME, declined offer to get tub seat. She declines offer to home health services  Patient's granddaughter will pick her up   Final next level of care: Home/Self Care Barriers to Discharge: No Barriers Identified   Patient Goals and CMS Choice   Choice offered to / list presented to : NA  Discharge Placement                         Discharge Plan and Services Additional resources added to the After Visit Summary for     Discharge Planning Services: CM Consult Post Acute Care Choice: NA          DME Arranged: N/A DME Agency: NA       HH Arranged: NA          Social Determinants of Health (SDOH) Interventions SDOH Screenings   Food Insecurity: No Food Insecurity (06/11/2023)  Housing: Patient Declined (06/11/2023)  Transportation Needs: No Transportation Needs (06/11/2023)  Utilities: Not At Risk (06/11/2023)  Tobacco Use: Low Risk  (06/23/2023)     Readmission Risk Interventions    06/12/2023   10:02 AM 06/06/2023   12:08 PM  Readmission Risk Prevention Plan  Transportation Screening Complete Complete  PCP or Specialist Appt within 5-7 Days  Complete  PCP or Specialist Appt within 3-5 Days Complete   Home Care Screening  Complete  Medication Review (RN CM)  Referral to Pharmacy  HRI or Home Care Consult Complete   Social Work Consult for Recovery Care Planning/Counseling Complete   Palliative Care Screening Not Applicable   Medication Review Oceanographer) Complete

## 2023-06-28 NOTE — Progress Notes (Signed)
Patient ID: Cassandra Baker, female   DOB: 08/18/1941, 81 y.o.   MRN: [id number] S: No new complaints.  Liked HD in the recliner. O:BP (!) 100/49 (BP Location: Right Arm)   Pulse 76   Temp 98.8 F (37.1 C) (Oral)   Resp 16   Ht 5\' 4"  (1.626 m)   Wt 71.2 kg   LMP  (LMP Unknown)   SpO2 96%   BMI 26.93 kg/m   Intake/Output Summary (Last 24 hours) at 06/28/2023 1039 Last data filed at 06/28/2023 0815 Gross per 24 hour  Intake 960 ml  Output 4000 ml  Net -3040 ml   Intake/Output: I/O last 3 completed shifts: In: 1320 [P.O.:1320] Out: 4150 [Urine:650; Other:3500]  Intake/Output this shift:  Total I/O In: 240 [P.O.:240] Out: -  Weight change: -2.767 kg Gen: NAD CVS: RRR Resp:CTA Abd: +BS, NT/ND NWG:NFAOZ pretibial edema bilaterally, LUE AVF +T/B  Recent Labs  Lab 06/22/23 1136 06/23/23 0043 06/25/23 0919 06/27/23 0930  NA 127* 125* 127* 121*  K 4.3 4.9 4.1 4.3  CL 91* 89* 88* 84*  CO2  --  25 26 25   GLUCOSE 108* 185* 210* 196*  BUN 37* 40* 46* 43*  CREATININE 4.50* 5.03* 4.44* 4.07*  ALBUMIN  --   --  2.9* 3.1*  CALCIUM  --  9.8 9.4 9.7  PHOS  --   --  4.4 4.3   Liver Function Tests: Recent Labs  Lab 06/25/23 0919 06/27/23 0930  ALBUMIN 2.9* 3.1*   No results for input(s): "LIPASE", "AMYLASE" in the last 168 hours. No results for input(s): "AMMONIA" in the last 168 hours. CBC: Recent Labs  Lab 06/22/23 0108 06/22/23 1136 06/23/23 0043 06/25/23 0923 06/25/23 1852 06/27/23 0930  WBC 9.5  --  12.9* 5.8  --  8.5  HGB 9.5*   < > 9.9* 6.3* 10.3* 10.1*  HCT 29.9*   < > 31.2* 20.7* 32.5* 31.3*  MCV 96.1  --  95.7 100.5*  --  96.6  PLT 140*  --  170 96*  --  127*   < > = values in this interval not displayed.   Cardiac Enzymes: No results for input(s): "CKTOTAL", "CKMB", "CKMBINDEX", "TROPONINI" in the last 168 hours. CBG: Recent Labs  Lab 06/26/23 2117 06/27/23 0554 06/27/23 1112 06/27/23 2215 06/28/23 0822  GLUCAP 150* 141* 147* 242* 175*    Iron  Studies: No results for input(s): "IRON", "TIBC", "TRANSFERRIN", "FERRITIN" in the last 72 hours. Studies/Results: No results found.  amiodarone  200 mg Oral Daily   aspirin EC  81 mg Oral Daily   atorvastatin  80 mg Oral Daily   calcium acetate  667 mg Oral TID WC   Chlorhexidine Gluconate Cloth  6 each Topical Q0600   clopidogrel  75 mg Oral Daily   [START ON 07/02/2023] darbepoetin (ARANESP) injection - DIALYSIS  100 mcg Subcutaneous Q Thu-1800   feeding supplement (NEPRO CARB STEADY)  237 mL Oral BID BM   FLUoxetine  10 mg Oral Once per day on Mon Wed Fri   furosemide  80 mg Oral BID   insulin aspart  0-15 Units Subcutaneous TID WC   levothyroxine  75 mcg Oral QAC breakfast   metoprolol succinate  25 mg Oral Daily   multivitamin  1 tablet Oral QHS    BMET    Component Value Date/Time   NA 121 (L) 06/27/2023 0930   NA 136 03/03/2014 1004   K 4.3 06/27/2023 0930   K 4.4  03/03/2014 1004   CL 84 (L) 06/27/2023 0930   CL 106 03/03/2014 1004   CO2 25 06/27/2023 0930   CO2 25 03/03/2014 1004   GLUCOSE 196 (H) 06/27/2023 0930   GLUCOSE 212 (H) 03/03/2014 1004   BUN 43 (H) 06/27/2023 0930   BUN 44 (H) 03/03/2014 1004   CREATININE 4.07 (H) 06/27/2023 0930   CREATININE 1.86 (H) 03/03/2014 1004   CALCIUM 9.7 06/27/2023 0930   CALCIUM 9.5 03/03/2014 1004   GFRNONAA 10 (L) 06/27/2023 0930   GFRNONAA 26 (L) 03/03/2014 1004   GFRAA 23 (L) 02/18/2018 1424   GFRAA 31 (L) 03/03/2014 1004   CBC    Component Value Date/Time   WBC 8.5 06/27/2023 0930   RBC 3.24 (L) 06/27/2023 0930   HGB 10.1 (L) 06/27/2023 0930   HGB 12.0 03/03/2014 1004   HCT 31.3 (L) 06/27/2023 0930   HCT 35.9 03/03/2014 1004   PLT 127 (L) 06/27/2023 0930   PLT 177 03/03/2014 1004   MCV 96.6 06/27/2023 0930   MCV 84 03/03/2014 1004   MCH 31.2 06/27/2023 0930   MCHC 32.3 06/27/2023 0930   RDW 16.0 (H) 06/27/2023 0930   RDW 15.3 (H) 03/03/2014 1004   LYMPHSABS 0.7 06/13/2023 0433   MONOABS 0.8 06/13/2023  0433   EOSABS 0.3 06/13/2023 0433   BASOSABS 0.1 06/13/2023 0433    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 Allena Katz. PMH: CAD, NSTEMI, DMT2, HTN, hypothyroidism.    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 Had 1st HD 6/21 and 2nd HD 6/24 (shortened to 1 hr by pt) at [facility]. Had 3rd HD 6/25 and the 4th HD session on 6/27 here.  Next HD 06/27/2023.  Tolerated HD in recliner.  She will now be on MWF schedule at [facility] starting 06/29/23.  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 Likely mediated by ischemia. Cardiology following. 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 G1DD. Appears euvolemic. Optimize volume with HD.  Vascular access: VVS consulted for permanent access. S/p RIJ TDC and left BC AVF creation 06/22/23 by Dr. Karin Lieu. Temp femoral temporary cath removed 06/22/23.  NSTEMI/ CAD - w/ heart cath showing 3V CAD done at [facility]. Underwent PCI to RCA on 06/18/2023. Cardiology following.   HTN/ volume - edema improved after UF of 3 liters with HD.  Continue to challenge edw.     Hyponatremia - changing to renal diet w/ fluid restriction 1200 cc/d. Na+ 127 today.  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 Tsat done 24%, ferritin 152. Started darbe 60 mcg sq weekly while here.  Hgb dropped to 6.3 and received blood transfusion 06/25/23 with increase to 10.3.  Will increase aranesp to 100 mcg q week MBD ckd - CCa in range. Getting phoslo at 1 ac tid. F/u phos improving w/ hd and binders.  Get pth.  A/C systolic CHF - no vol overload. Echo showed LVEF 30-35%.  DM2 - per primary svc Hypothyroid Anxiety/ depression EColi UTI Disposition -  She was accepted at [facility] on MWF schedule.  Chair time is 11:00 am and will need to be there 10:15 on 06/29/23.  Stable for discharge from renal standpoint today.  Irena Cords, MD [facility]

## 2023-06-29 ENCOUNTER — Telehealth: Payer: Self-pay | Admitting: Physician Assistant

## 2023-06-29 DIAGNOSIS — Z992 Dependence on renal dialysis: Secondary | ICD-10-CM | POA: Insufficient documentation

## 2023-06-29 DIAGNOSIS — T7840XA Allergy, unspecified, initial encounter: Secondary | ICD-10-CM | POA: Insufficient documentation

## 2023-06-29 DIAGNOSIS — E1122 Type 2 diabetes mellitus with diabetic chronic kidney disease: Secondary | ICD-10-CM | POA: Diagnosis not present

## 2023-06-29 DIAGNOSIS — I272 Pulmonary hypertension, unspecified: Secondary | ICD-10-CM | POA: Insufficient documentation

## 2023-06-29 DIAGNOSIS — D631 Anemia in chronic kidney disease: Secondary | ICD-10-CM | POA: Insufficient documentation

## 2023-06-29 DIAGNOSIS — Z7984 Long term (current) use of oral hypoglycemic drugs: Secondary | ICD-10-CM | POA: Insufficient documentation

## 2023-06-29 DIAGNOSIS — J45909 Unspecified asthma, uncomplicated: Secondary | ICD-10-CM | POA: Insufficient documentation

## 2023-06-29 DIAGNOSIS — R0602 Shortness of breath: Secondary | ICD-10-CM | POA: Insufficient documentation

## 2023-06-29 DIAGNOSIS — N186 End stage renal disease: Secondary | ICD-10-CM | POA: Diagnosis not present

## 2023-06-29 DIAGNOSIS — T782XXA Anaphylactic shock, unspecified, initial encounter: Secondary | ICD-10-CM | POA: Insufficient documentation

## 2023-06-29 DIAGNOSIS — I132 Hypertensive heart and chronic kidney disease with heart failure and with stage 5 chronic kidney disease, or end stage renal disease: Secondary | ICD-10-CM | POA: Insufficient documentation

## 2023-06-29 DIAGNOSIS — N2581 Secondary hyperparathyroidism of renal origin: Secondary | ICD-10-CM | POA: Insufficient documentation

## 2023-06-29 DIAGNOSIS — Z7982 Long term (current) use of aspirin: Secondary | ICD-10-CM | POA: Insufficient documentation

## 2023-06-29 NOTE — Telephone Encounter (Signed)
-----   Message from Lars Mage, [place] sent at 06/23/2023  8:17 AM EDT ----- S/P s/p first stage basilic fistula f/u in 5-6 weeks with duplex of fistula on PA schedule

## 2023-06-29 NOTE — Telephone Encounter (Signed)
Appt has been scheduled.

## 2023-06-29 NOTE — Progress Notes (Signed)
Late Note Entry  Pt d/c yesterday. Contacted [facility] to advise clinic of pt's d/c date and that pt should start today as planned.  Olivia Canter Renal Navigator [phone]

## 2023-06-30 NOTE — Telephone Encounter (Signed)
Patient contacted regarding discharge from [facility] on June 28, 2023  Patient understands to follow up with provider.  Yes  Jari Favre, PA on July 16 at 2:45 at [address] [facility] Patient understands discharge instructions? yes Patient understands medications and regiment? yes Patient understands to bring all medications to this visit? yes  Ask patient:  Are you enrolled in My Chart No.  If no ask patient if they would like to enroll. Patient would like to enroll.  She has discharge instructions from the hospital.  Patient made aware instructions on how to enroll are in discharge instructions.

## 2023-07-01 DIAGNOSIS — N2581 Secondary hyperparathyroidism of renal origin: Secondary | ICD-10-CM | POA: Diagnosis not present

## 2023-07-01 DIAGNOSIS — Z992 Dependence on renal dialysis: Secondary | ICD-10-CM | POA: Diagnosis not present

## 2023-07-01 DIAGNOSIS — N186 End stage renal disease: Secondary | ICD-10-CM | POA: Diagnosis not present

## 2023-07-01 DIAGNOSIS — D631 Anemia in chronic kidney disease: Secondary | ICD-10-CM | POA: Diagnosis not present

## 2023-07-02 ENCOUNTER — Encounter: Payer: Self-pay | Admitting: Emergency Medicine

## 2023-07-02 ENCOUNTER — Other Ambulatory Visit: Payer: Self-pay

## 2023-07-02 ENCOUNTER — Ambulatory Visit: Payer: Medicare Other | Admitting: Family

## 2023-07-02 ENCOUNTER — Emergency Department
Admission: EM | Admit: 2023-07-02 | Discharge: 2023-07-02 | Discharge status: Against Medical Advice | Payer: Medicare Other | Attending: Student in an Organized Health Care Education/Training Program | Admitting: Student in an Organized Health Care Education/Training Program

## 2023-07-02 DIAGNOSIS — Z992 Dependence on renal dialysis: Secondary | ICD-10-CM | POA: Insufficient documentation

## 2023-07-02 DIAGNOSIS — R2232 Localized swelling, mass and lump, left upper limb: Secondary | ICD-10-CM | POA: Diagnosis not present

## 2023-07-02 DIAGNOSIS — R2242 Localized swelling, mass and lump, left lower limb: Secondary | ICD-10-CM | POA: Insufficient documentation

## 2023-07-02 DIAGNOSIS — R202 Paresthesia of skin: Secondary | ICD-10-CM | POA: Diagnosis not present

## 2023-07-02 DIAGNOSIS — Z5321 Procedure and treatment not carried out due to patient leaving prior to being seen by health care provider: Secondary | ICD-10-CM | POA: Insufficient documentation

## 2023-07-02 DIAGNOSIS — R2241 Localized swelling, mass and lump, right lower limb: Secondary | ICD-10-CM | POA: Diagnosis not present

## 2023-07-02 LAB — BASIC METABOLIC PANEL
Anion gap: 12 (ref 5–15)
BUN: 39 mg/dL — ABNORMAL HIGH (ref 8–23)
CO2: 27 mmol/L (ref 22–32)
Calcium: 9.5 mg/dL (ref 8.9–10.3)
Chloride: 94 mmol/L — ABNORMAL LOW (ref 98–111)
Creatinine, Ser: 3.61 mg/dL — ABNORMAL HIGH (ref 0.44–1.00)
GFR, Estimated: 12 mL/min — ABNORMAL LOW (ref >60)
Glucose, Bld: 159 mg/dL — ABNORMAL HIGH (ref 70–99)
Potassium: 3.6 mmol/L (ref 3.5–5.1)
Sodium: 133 mmol/L — ABNORMAL LOW (ref 135–145)

## 2023-07-02 LAB — CBC WITH DIFFERENTIAL/PLATELET
Abs Immature Granulocytes: 0.03 10*3/uL (ref 0.00–0.07)
Basophils Absolute: 0 10*3/uL (ref 0.0–0.1)
Basophils Relative: 1 %
Eosinophils Absolute: 0.2 10*3/uL (ref 0.0–0.5)
Eosinophils Relative: 4 %
HCT: 29.1 % — ABNORMAL LOW (ref 36.0–46.0)
Hemoglobin: 9.4 g/dL — ABNORMAL LOW (ref 12.0–15.0)
Immature Granulocytes: 1 %
Lymphocytes Relative: 7 %
Lymphs Abs: 0.4 10*3/uL — ABNORMAL LOW (ref 0.7–4.0)
MCH: 31.8 pg (ref 26.0–34.0)
MCHC: 32.3 g/dL (ref 30.0–36.0)
MCV: 98.3 fL (ref 80.0–100.0)
Monocytes Absolute: 0.4 10*3/uL (ref 0.1–1.0)
Monocytes Relative: 7 %
Neutro Abs: 4.4 10*3/uL (ref 1.7–7.7)
Neutrophils Relative %: 80 %
Platelets: 91 10*3/uL — ABNORMAL LOW (ref 150–400)
RBC: 2.96 MIL/uL — ABNORMAL LOW (ref 3.87–5.11)
RDW: 16.9 % — ABNORMAL HIGH (ref 11.5–15.5)
WBC: 5.5 10*3/uL (ref 4.0–10.5)
nRBC: 0 % (ref 0.0–0.2)

## 2023-07-02 LAB — BRAIN NATRIURETIC PEPTIDE: B Natriuretic Peptide: >4500 pg/mL — ABNORMAL HIGH (ref 0.0–100.0)

## 2023-07-02 NOTE — Progress Notes (Deleted)
Advanced Heart Failure Clinic Note   Referring Physician: PCP: Renford Dills, MD PCP-Cardiologist: Julien Nordmann, MD   HPI:  Cassandra Baker is a 82 y/o female with a history of CAD (NSTEMI), PCI, VT, asthma, torsades de pointes,  DM, HTN, CKD on dialysis, thyroid disease, anemia, depression and chronic heart failure.   Admitted 06/04/23 with increasing exertional dyspnea over the preceding year with occasional vague chest discomfort. Found to have NSTEMI, acute combined CHF complicated by CKD. Echocardiogram 6/13 showed EF 30-35% with global hypokinesis with severe hypokinesis of the inferior/posterior/septal wall, Grade I DD, normal RVSF, moderate MR with moderate mitral annular calcification, and an estimated right atrial pressure of . Due to CKD, cath was not pursued immediately and instead underwent nuclear stress test that showed EF 31%. Overall, the scan was high risk as there was evidence of a large infarct. Considered LHC, but this was deferred due to poor renal function. She was treated with 48 hours of IV heparin. Escalation of GDMT was limited by co morbidities, and she ultimately left AMA on 6/16.   Admitted 06/11/23 to [facility] complaining of increasing lower extremity swelling with significant fatigue. BNP 2338. She underwent temporary HD catheter placement with initiation of dialysis on 06/12/23. On the morning of 06/13/23, patient developed torsades de pointes that lasted 13.5 seconds followed by ventricular bigeminy. During the episode, patient reported dizziness and blacking out. She underwent LHC on 06/15/23 that showed mildly elevated filling pressures, mild pulmonary hypertension, and normal cardiac output with no hemodynamic evidence of significant mitral regurgitation. Left heart catheterization showed severe 3V CAD, and the culprit was a subtotal occlusion of the RCA which is severely calcified in the mid-segment as well as complex bifurcation disease at the PDA/posterior AV groove.  She was started on IV heparin. After her cath, she continued to have frequent PVCs and NSVT on telemetry. She was started on amiodarone infusion. Decision was made to transfer patient to [facility] for CABG evaluation.   Patient was transferred to [facility] on 6/25 for CABG evaluation. Patient arrived on IV heparin and IV amiodarone. Evaluated by CT surgery and was not a candidate for CABG. Dr. Okey Dupre discussed options with family including multivessel PCI that may require lithotripsy/arterectomy vs palliative medical therapy. Family decided to pursue PCI. She was seen by Nephrology on 6/25 and underwent HD that evening. Received 1 unit of PRBCs during HD, and hemoglobin improved from 7.6 to 9.2. On 6/26, patient underwent successful complex intravascular lithotripsy and had 2 overlapping drug-eluting stents placed to the mid-proximal right coronary artery. The procedure was very difficult overall due to heavy calcifications and difficulty crossing the stenosis in the mid right coronary artery. Recommended treating the LAD disease medically. She was treated with ASA, plavix, lipitor. The patient did go into polymorphic ventricular tachycardia that lasted for about 10 seconds before starting the procedure. She converted spontaneously and did not require defibrillation. Later on 6/26, patient was transitioned from IV amiodarone to PO amiodarone and she remained on metoprolol succinate. She underwent HD on 6/27. IR was consulted for image guided tunneled HD cath. Vascular surgery consulted for permanent dialysis access. She was taken to the OR on 7/1 for tunneled dialysis catheter placement in the right internal jugular vein and left arm first stage brachiobasilic fistula creation. She was seen by vascular surgery on 7/2, who reported that her left UE incision was healing well. Patient underwent HD on 7/2.   Echo 06/04/23: EF 30-35% with severe hypokinesis of the inferior/posterior/septal  wall, mild LVH, Grade I DD  and moderate MR.    RHC/LHC 06/15/23:   Mid LAD lesion is 80% stenosed.   Mid Cx to Dist Cx lesion is 90% stenosed.   Ramus lesion is 60% stenosed.   Prox RCA to Mid RCA lesion is 95% stenosed.   Mid RCA to Dist RCA lesion is 60% stenosed.   RPDA lesion is 90% stenosed.   RPAV lesion is 80% stenosed.   There is moderate to severe left ventricular systolic dysfunction.   LV end diastolic pressure is mildly elevated.   The left ventricular ejection fraction is 25-35% by visual estimate.  1.  Severe three-vessel coronary artery disease.  The culprit for myocardial infarction is likely subtotal occlusion of the right coronary artery which is heavily calcified and diffusely diseased in the proximal/mid segment.  In addition, there is complex bifurcation disease at the right PDA and right posterior AV groove artery. 2.  Mildly with severely reduced LV systolic function with an EF of 30% with severe inferior and apical hypokinesis. 3.  Right heart catheterization showed mildly elevated filling pressures, mild pulmonary hypertension and normal cardiac output.  No significant V waves noted on wedge tracing.  PCI 06/17/23:   Mid LAD lesion is 80% stenosed.   Mid Cx to Dist Cx lesion is 90% stenosed.   Ramus lesion is 60% stenosed.   RPDA lesion is 90% stenosed.   Prox RCA lesion is 80% stenosed.   Prox RCA to Mid RCA lesion is 99% stenosed.   Mid RCA lesion is 70% stenosed.   RPAV lesion is 60% stenosed.   A drug-eluting stent was successfully placed using a SYNERGY XD 3.0X48.   A drug-eluting stent was successfully placed using a SYNERGY XD 3.50X16.   Post intervention, there is a 0% residual stenosis.   Post intervention, there is a 0% residual stenosis.   Post intervention, there is a 0% residual stenosis.  Moderately to severely elevated left ventricular end-diastolic pressure at 32 mmHg. 2. Successful complex intravascular lithotripsy and 2 overlapped drug-eluting stent placement to the mid  and proximal right coronary artery.  Very difficult procedure overall due to heavy calcifications and difficulty crossing the stenosis in the mid right coronary artery.  The distal RCA bifurcation disease appeared better than before likely due to improved upstream flow and thus I elected not to treat. 3.  The patient did go into polymorphic ventricular tachycardia that lasted for about 10 seconds before starting the procedure.  She converted and did not require defibrillation. 4.  Difficulty sedating the patient with continuous involuntary movements of her lower extremities throughout the case.  She presents today for her initial HF visit with a chief complaint of  Review of Systems: [y] = yes, [ ]  = no   General: Weight gain [ ] ; Weight loss [ ] ; Anorexia [ ] ; Fatigue [ ] ; Fever [ ] ; Chills [ ] ; Weakness [ ]   Cardiac: Chest pain/pressure [ ] ; Resting SOB [ ] ; Exertional SOB [ ] ; Orthopnea [ ] ; Pedal Edema [ ] ; Palpitations [ ] ; Syncope [ ] ; Presyncope [ ] ; Paroxysmal nocturnal dyspnea[ ]   Pulmonary: Cough [ ] ; Wheezing[ ] ; Hemoptysis[ ] ; Sputum [ ] ; Snoring [ ]   GI: Vomiting[ ] ; Dysphagia[ ] ; Melena[ ] ; Hematochezia [ ] ; Heartburn[ ] ; Abdominal pain [ ] ; Constipation [ ] ; Diarrhea [ ] ; BRBPR [ ]   GU: Hematuria[ ] ; Dysuria [ ] ; Nocturia[ ]   Vascular: Pain in legs with walking [ ] ; Pain in feet  with lying flat [ ] ; Non-healing sores [ ] ; Stroke [ ] ; TIA [ ] ; Slurred speech [ ] ;  Neuro: Headaches[ ] ; Vertigo[ ] ; Seizures[ ] ; Paresthesias[ ] ;Blurred vision [ ] ; Diplopia [ ] ; Vision changes [ ]   Ortho/Skin: Arthritis [ ] ; Joint pain [ ] ; Muscle pain [ ] ; Joint swelling [ ] ; Back Pain [ ] ; Rash [ ]   Psych: Depression[ ] ; Anxiety[ ]   Heme: Bleeding problems [ ] ; Clotting disorders [ ] ; Anemia [ ]   Endocrine: Diabetes [ ] ; Thyroid dysfunction[ ]    Past Medical History:  Diagnosis Date   Anemia    Arthritis    low back,knees   Asthma    Basal cell carcinoma (BCC)    a. nose   CHF (congestive  heart failure) (HCC)    a. prev eval by Dr. Katrinka Blazing in [place] - "many yrs ago."   CKD (chronic kidney disease), stage IV (HCC)    Depression    Diabetes mellitus without complication (HCC)    Dyspnea    Hypertension    Hypothyroidism     Current Outpatient Medications  Medication Sig Dispense Refill   acetaminophen (TYLENOL) 500 MG tablet Take 500-1,000 mg by mouth every 6 (six) hours as needed (pain).      allopurinol (ZYLOPRIM) 100 MG tablet Take 100 mg by mouth daily as needed (For gout).     amiodarone (PACERONE) 200 MG tablet Take 1 tablet (200 mg total) by mouth daily. 90 tablet 3   aspirin EC 81 MG tablet Take 1 tablet (81 mg total) by mouth daily. Swallow whole. 30 tablet 12   atorvastatin (LIPITOR) 80 MG tablet Take 1 tablet (80 mg total) by mouth daily.     calcium acetate (PHOSLO) 667 MG tablet Take 667 mg by mouth 3 (three) times daily.     clopidogrel (PLAVIX) 75 MG tablet Take 1 tablet (75 mg total) by mouth daily. 90 tablet 3   FLUoxetine (PROZAC) 10 MG capsule Take 10 mg by mouth daily. At night     furosemide (LASIX) 40 MG tablet Take 80 mg by mouth 2 (two) times daily.      hydrOXYzine (ATARAX/VISTARIL) 10 MG tablet Take 10 mg by mouth at bedtime as needed for itching.     levothyroxine (SYNTHROID) 75 MCG tablet Take 75 mcg by mouth daily before breakfast.     loratadine (CLARITIN) 5 MG chewable tablet Chew 5 mg by mouth daily as needed for allergies.     metoprolol succinate (TOPROL-XL) 25 MG 24 hr tablet Take 1 tablet (25 mg total) by mouth daily. Take with or immediately following a meal. (Patient taking differently: Take 25 mg by mouth daily.)     nitroGLYCERIN (NITROSTAT) 0.4 MG SL tablet Place 1 tablet (0.4 mg total) under the tongue every 5 (five) minutes x 3 doses as needed for chest pain. 25 tablet 3   sitaGLIPtin (JANUVIA) 25 MG tablet Take 25 mg by mouth daily.     sodium bicarbonate 650 MG tablet Take 650 mg by mouth 2 (two) times daily.     No current  facility-administered medications for this visit.    Allergies  Allergen Reactions   Plum Pulp Anaphylaxis and Cough   Almond (Diagnostic) Itching   Other Itching    Dairy products/Soaps & detergents   Peanut-Containing Drug Products Itching   Penicillins Rash    Tolerates ceftriaxone      Social History   Socioeconomic History   Marital status: Married    Spouse  name: Not on file   Number of children: Not on file   Years of education: Not on file   Highest education level: Not on file  Occupational History   Not on file  Tobacco Use   Smoking status: Never   Smokeless tobacco: Never  Vaping Use   Vaping status: Never Used  Substance and Sexual Activity   Alcohol use: No   Drug use: No   Sexual activity: Not Currently    Birth control/protection: Post-menopausal  Other Topics Concern   Not on file  Social History Narrative   Lives in [place].  Does not routinely exercise.   Social Determinants of Health   Financial Resource Strain: Not on file  Food Insecurity: No Food Insecurity (06/11/2023)   Hunger Vital Sign    Worried About Running Out of Food in the Last Year: Never true    Ran Out of Food in the Last Year: Never true  Transportation Needs: No Transportation Needs (06/11/2023)   PRAPARE - Administrator, Civil Service (Medical): No    Lack of Transportation (Non-Medical): No  Physical Activity: Not on file  Stress: Not on file  Social Connections: Not on file  Intimate Partner Violence: Unknown (06/11/2023)   Humiliation, Afraid, Rape, and Kick questionnaire    Fear of Current or Ex-Partner: Patient declined    Emotionally Abused: No    Physically Abused: No    Sexually Abused: No      Family History  Problem Relation Age of Onset   Hypertension Mother        MI's beginning in 63's, died @ 20   Heart disease Mother    Hypertension Father    Heart disease Father    Heart disease Brother    Stroke Brother    Cervical cancer Maternal  Aunt    Heart disease Brother    Stroke Brother        PHYSICAL EXAM: General:  Well appearing. No respiratory difficulty HEENT: normal Neck: supple. no JVD. Carotids 2+ bilat; no bruits. No lymphadenopathy or thyromegaly appreciated. Cor: PMI nondisplaced. Regular rate & rhythm. No rubs, gallops or murmurs. Lungs: clear Abdomen: soft, nontender, nondistended. No hepatosplenomegaly. No bruits or masses. Good bowel sounds. Extremities: no cyanosis, clubbing, rash, edema Neuro: alert & oriented x 3, cranial nerves grossly intact. moves all 4 extremities w/o difficulty. Affect pleasant.  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   - continue  - BNP   2: HTN- - BP  3: CAD- - saw cardiology - PCI 06/17/23:   Mid LAD lesion is 80% stenosed.   Mid Cx to Dist Cx lesion is 90% stenosed.   Ramus lesion is 60% stenosed.   RPDA lesion is 90% stenosed.   Prox RCA lesion is 80% stenosed.   Prox RCA to Mid RCA lesion is 99% stenosed.   Mid RCA lesion is 70% stenosed.   RPAV lesion is 60% stenosed.   A drug-eluting stent was successfully placed using a SYNERGY XD 3.0X48.   A drug-eluting stent was successfully placed using a SYNERGY XD 3.50X16.   Post intervention, there is a 0% residual stenosis.   Post intervention, there is a 0% residual stenosis.   Post intervention, there is a 0% residual stenosis.  Moderately to severely elevated left ventricular end-diastolic pressure at 32 mmHg. 2. Successful complex intravascular lithotripsy and 2 overlapped drug-eluting stent placement to the mid and proximal right coronary artery.  Very difficult procedure overall due to heavy calcifications and difficulty crossing the stenosis in the mid right coronary artery.  The distal RCA  bifurcation disease appeared better than before likely due to improved upstream flow and thus I elected not to treat. 3.  The patient did go into polymorphic ventricular tachycardia that lasted for about 10 seconds before starting the procedure.  She converted and did not require defibrillation. 4.  Difficulty sedating the patient with continuous involuntary movements of her lower extremities throughout the case.  4: CKD/ ESRD- -    Delma Freeze, FNP 07/02/23

## 2023-07-02 NOTE — ED Notes (Signed)
Patient and daughter in law up to front desk asking for courtesy car to take them back around to medical mall. Patient requesting to leave. Patient asked multiple times by this RN not to leave. Patient refusing to stay. Patients daughter in law wheeled patient outside to wait for courtesy car

## 2023-07-02 NOTE — ED Triage Notes (Signed)
Patient to ED via POV for bilateral feet swelling, left arm swelling with hand tinging (x1 week). Also having oozing from dialysis port. Discharged on Sunday- recently started dialysis. Missed heart failure appointment today.

## 2023-07-06 DIAGNOSIS — D631 Anemia in chronic kidney disease: Secondary | ICD-10-CM | POA: Diagnosis not present

## 2023-07-06 DIAGNOSIS — N2581 Secondary hyperparathyroidism of renal origin: Secondary | ICD-10-CM | POA: Diagnosis not present

## 2023-07-06 DIAGNOSIS — N186 End stage renal disease: Secondary | ICD-10-CM | POA: Diagnosis not present

## 2023-07-06 DIAGNOSIS — Z992 Dependence on renal dialysis: Secondary | ICD-10-CM | POA: Diagnosis not present

## 2023-07-07 ENCOUNTER — Ambulatory Visit: Payer: Medicare Other | Attending: Physician Assistant | Admitting: Physician Assistant

## 2023-07-07 ENCOUNTER — Encounter: Payer: Self-pay | Admitting: Physician Assistant

## 2023-07-07 VITALS — BP 100/58 | HR 67 | Ht 64.0 in | Wt 166.0 lb

## 2023-07-07 DIAGNOSIS — E039 Hypothyroidism, unspecified: Secondary | ICD-10-CM | POA: Diagnosis not present

## 2023-07-07 DIAGNOSIS — I5020 Unspecified systolic (congestive) heart failure: Secondary | ICD-10-CM | POA: Insufficient documentation

## 2023-07-07 DIAGNOSIS — D638 Anemia in other chronic diseases classified elsewhere: Secondary | ICD-10-CM | POA: Diagnosis not present

## 2023-07-07 DIAGNOSIS — I25118 Atherosclerotic heart disease of native coronary artery with other forms of angina pectoris: Secondary | ICD-10-CM | POA: Diagnosis not present

## 2023-07-07 DIAGNOSIS — I428 Other cardiomyopathies: Secondary | ICD-10-CM | POA: Diagnosis not present

## 2023-07-07 DIAGNOSIS — I4729 Other ventricular tachycardia: Secondary | ICD-10-CM | POA: Diagnosis not present

## 2023-07-07 DIAGNOSIS — N390 Urinary tract infection, site not specified: Secondary | ICD-10-CM | POA: Diagnosis not present

## 2023-07-07 NOTE — Patient Instructions (Addendum)
Medication Instructions:   Your physician recommends that you continue on your current medications as directed. Please refer to the Current Medication list given to you today.   *If you need a refill on your cardiac medications before your next appointment, please call your pharmacy*    If you have labs (blood work) drawn today and your tests are completely normal, you will receive your results only by: MyChart Message (if you have MyChart) OR A paper copy in the mail If you have any lab test that is abnormal or we need to change your treatment, we will call you to review the results.   Testing/Procedures: Your physician has requested that you have an echocardiogram. Echocardiography is a painless test that uses sound waves to create images of your heart. It provides your doctor with information about the size and shape of your heart and how well your heart's chambers and valves are working. This procedure takes approximately one hour. There are no restrictions for this procedure. Please do NOT wear cologne, perfume, aftershave, or lotions (deodorant is allowed). Please arrive 15 minutes prior to your appointment time.   Follow-Up: At [facility], you and your health needs are our priority.  As part of our continuing mission to provide you with exceptional heart care, we have created designated Provider Care Teams.  These Care Teams include your primary Cardiologist (physician) and Advanced Practice Providers (APPs -  Physician Assistants and Nurse Practitioners) who all work together to provide you with the care you need, when you need it.  We recommend signing up for the patient portal called "MyChart".  Sign up information is provided on this After Visit Summary.  MyChart is used to connect with patients for Virtual Visits (Telemedicine).  Patients are able to view lab/test results, encounter notes, upcoming appointments, etc.  Non-urgent messages can be sent to your provider as  well.   To learn more about what you can do with MyChart, go to [email or web address].    Your next appointment:    3 month(s)  Provider:   Julien Nordmann, MD    Other Instructions   Low-Sodium Eating Plan Salt (sodium) helps you keep a healthy balance of fluids in your body. Too much sodium can raise your blood pressure. It can also cause fluid and waste to be held in your body. Your health care provider or dietitian may recommend a low-sodium eating plan if you have high blood pressure (hypertension), kidney disease, liver disease, or heart failure. Eating less sodium can help lower your blood pressure and reduce swelling. It can also protect your heart, liver, and kidneys. What are tips for following this plan? Reading food labels  Check food labels for the amount of sodium per serving. If you eat more than one serving, you must multiply the listed amount by the number of servings. Choose foods with less than 140 milligrams (mg) of sodium per serving. Avoid foods with 300 mg of sodium or more per serving. Always check how much sodium is in a product, even if the label says "unsalted" or "no salt added." Shopping  Buy products labeled as "low-sodium" or "no salt added." Buy fresh foods. Avoid canned foods and pre-made or frozen meals. Avoid canned, cured, or processed meats. Buy breads that have less than 80 mg of sodium per slice. Cooking  Eat more home-cooked food. Try to eat less restaurant, buffet, and fast food. Try not to add salt when you cook. Use salt-free seasonings or herbs instead of table  salt or sea salt. Check with your provider or pharmacist before using salt substitutes. Cook with plant-based oils, such as canola, sunflower, or olive oil. Meal planning When eating at a restaurant, ask if your food can be made with less salt or no salt. Avoid dishes labeled as brined, pickled, cured, or smoked. Avoid dishes made with soy sauce, miso, or teriyaki sauce. Avoid  foods that have monosodium glutamate (MSG) in them. MSG may be added to some restaurant food, sauces, soups, bouillon, and canned foods. Make meals that can be grilled, baked, poached, roasted, or steamed. These are often made with less sodium. General information Try to limit your sodium intake to 1,500-2,300 mg each day, or the amount told by your provider. What foods should I eat? Fruits Fresh, frozen, or canned fruit. Fruit juice. Vegetables Fresh or frozen vegetables. "No salt added" canned vegetables. "No salt added" tomato sauce and paste. Low-sodium or reduced-sodium tomato and vegetable juice. Grains Low-sodium cereals, such as oats, puffed wheat and rice, and shredded wheat. Low-sodium crackers. Unsalted rice. Unsalted pasta. Low-sodium bread. Whole grain breads and whole grain pasta. Meats and other proteins Fresh or frozen meat, poultry, seafood, and fish. These should have no added salt. Low-sodium canned tuna and salmon. Unsalted nuts. Dried peas, beans, and lentils without added salt. Unsalted canned beans. Eggs. Unsalted nut butters. Dairy Milk. Soy milk. Cheese that is naturally low in sodium, such as ricotta cheese, fresh mozzarella, or Swiss cheese. Low-sodium or reduced-sodium cheese. Cream cheese. Yogurt. Seasonings and condiments Fresh and dried herbs and spices. Salt-free seasonings. Low-sodium mustard and ketchup. Sodium-free salad dressing. Sodium-free light mayonnaise. Fresh or refrigerated horseradish. Lemon juice. Vinegar. Other foods Homemade, reduced-sodium, or low-sodium soups. Unsalted popcorn and pretzels. Low-salt or salt-free chips. The items listed above may not be all the foods and drinks you can have. Talk to a dietitian to learn more. What foods should I avoid? Vegetables Sauerkraut, pickled vegetables, and relishes. Olives. [place] fries. Onion rings. Regular canned vegetables, except low-sodium or reduced-sodium items. Regular canned tomato sauce and  paste. Regular tomato and vegetable juice. Frozen vegetables in sauces. Grains Instant hot cereals. Bread stuffing, pancake, and biscuit mixes. Croutons. Seasoned rice or pasta mixes. Noodle soup cups. Boxed or frozen macaroni and cheese. Regular salted crackers. Self-rising flour. Meats and other proteins Meat or fish that is salted, canned, smoked, spiced, or pickled. Precooked or cured meat, such as sausages or meat loaves. Tomasa Blase. Ham. Pepperoni. Hot dogs. Corned beef. Chipped beef. Salt pork. Jerky. Pickled herring, anchovies, and sardines. Regular canned tuna. Salted nuts. Dairy Processed cheese and cheese spreads. Hard cheeses. Cheese curds. Blue cheese. Feta cheese. String cheese. Regular cottage cheese. Buttermilk. Canned milk. Fats and oils Salted butter. Regular margarine. Ghee. Bacon fat. Seasonings and condiments Onion salt, garlic salt, seasoned salt, table salt, and sea salt. Canned and packaged gravies. Worcestershire sauce. Tartar sauce. Barbecue sauce. Teriyaki sauce. Soy sauce, including reduced-sodium soy sauce. Steak sauce. Fish sauce. Oyster sauce. Cocktail sauce. Horseradish that you find on the shelf. Regular ketchup and mustard. Meat flavorings and tenderizers. Bouillon cubes. Hot sauce. Pre-made or packaged marinades. Pre-made or packaged taco seasonings. Relishes. Regular salad dressings. Salsa. Other foods Salted popcorn and pretzels. Corn chips and puffs. Potato and tortilla chips. Canned or dried soups. Pizza. Frozen entrees and pot pies. The items listed above may not be all the foods and drinks you should avoid. Talk to a dietitian to learn more. This information is not intended to replace advice given  to you by your health care provider. Make sure you discuss any questions you have with your health care provider. Document Revised: 12/25/2022 Document Reviewed: 12/25/2022 Elsevier Patient Education  2024 [employer]  Heart-Healthy Eating Plan Eating a healthy diet  is important for the health of your heart. A heart-healthy eating plan includes: Eating less unhealthy fats. Eating more healthy fats. Eating less salt in your food. Salt is also called sodium. Making other changes in your diet. Talk with your doctor or a diet specialist (dietitian) to create an eating plan that is right for you. What is my plan? Your doctor may recommend an eating plan that includes: Total fat: ______% or less of total calories a day. Saturated fat: ______% or less of total calories a day. Cholesterol: less than _________mg a day. Sodium: less than _________mg a day. What are tips for following this plan? Cooking Avoid frying your food. Try to bake, boil, grill, or broil it instead. You can also reduce fat by: Removing the skin from poultry. Removing all visible fats from meats. Steaming vegetables in water or broth. Meal planning  At meals, divide your plate into four equal parts: Fill one-half of your plate with vegetables and green salads. Fill one-fourth of your plate with whole grains. Fill one-fourth of your plate with lean protein foods. Eat 2-4 cups of vegetables per day. One cup of vegetables is: 1 cup (91 g) broccoli or cauliflower florets. 2 medium carrots. 1 large bell pepper. 1 large sweet potato. 1 large tomato. 1 medium white potato. 2 cups (150 g) raw leafy greens. Eat 1-2 cups of fruit per day. One cup of fruit is: 1 small apple 1 large banana 1 cup (237 g) mixed fruit, 1 large orange,  cup (82 g) dried fruit, 1 cup (240 mL) 100% fruit juice. Eat more foods that have soluble fiber. These are apples, broccoli, carrots, beans, peas, and barley. Try to get 20-30 g of fiber per day. Eat 4-5 servings of nuts, legumes, and seeds per week: 1 serving of dried beans or legumes equals  cup (90 g) cooked. 1 serving of nuts is  oz (12 almonds, 24 pistachios, or 7 walnut halves). 1 serving of seeds equals  oz (8 g). General information Eat  more home-cooked food. Eat less restaurant, buffet, and fast food. Limit or avoid alcohol. Limit foods that are high in starch and sugar. Avoid fried foods. Lose weight if you are overweight. Keep track of how much salt (sodium) you eat. This is important if you have high blood pressure. Ask your doctor to tell you more about this. Try to add vegetarian meals each week. Fats Choose healthy fats. These include olive oil and canola oil, flaxseeds, walnuts, almonds, and seeds. Eat more omega-3 fats. These include salmon, mackerel, sardines, tuna, flaxseed oil, and ground flaxseeds. Try to eat fish at least 2 times each week. Check food labels. Avoid foods with trans fats or high amounts of saturated fat. Limit saturated fats. These are often found in animal products, such as meats, butter, and cream. These are also found in plant foods, such as palm oil, palm kernel oil, and coconut oil. Avoid foods with partially hydrogenated oils in them. These have trans fats. Examples are stick margarine, some tub margarines, cookies, crackers, and other baked goods. What foods should I eat? Fruits All fresh, canned (in natural juice), or frozen fruits. Vegetables Fresh or frozen vegetables (raw, steamed, roasted, or grilled). Green salads. Grains Most grains. Choose whole wheat  and whole grains most of the time. Rice and pasta, including brown rice and pastas made with whole wheat. Meats and other proteins Lean, well-trimmed beef, veal, pork, and lamb. Chicken and [place] without skin. All fish and shellfish. Wild duck, rabbit, pheasant, and venison. Egg whites or low-cholesterol egg substitutes. Dried beans, peas, lentils, and tofu. Seeds and most nuts. Dairy Low-fat or nonfat cheeses, including ricotta and mozzarella. Skim or 1% milk that is liquid, powdered, or evaporated. Buttermilk that is made with low-fat milk. Nonfat or low-fat yogurt. Fats and oils Non-hydrogenated (trans-free) margarines.  Vegetable oils, including soybean, sesame, sunflower, olive, peanut, safflower, corn, canola, and cottonseed. Salad dressings or mayonnaise made with a vegetable oil. Beverages Mineral water. Coffee and tea. Diet carbonated beverages. Sweets and desserts Sherbet, gelatin, and fruit ice. Small amounts of dark chocolate. Limit all sweets and desserts. Seasonings and condiments All seasonings and condiments. The items listed above may not be a complete list of foods and drinks you can eat. Contact a dietitian for more options. What foods should I avoid? Fruits Canned fruit in heavy syrup. Fruit in cream or butter sauce. Fried fruit. Limit coconut. Vegetables Vegetables cooked in cheese, cream, or butter sauce. Fried vegetables. Grains Breads that are made with saturated or trans fats, oils, or whole milk. Croissants. Sweet rolls. Donuts. High-fat crackers, such as cheese crackers. Meats and other proteins Fatty meats, such as hot dogs, ribs, sausage, bacon, rib-eye roast or steak. High-fat deli meats, such as salami and bologna. Caviar. Domestic duck and goose. Organ meats, such as liver. Dairy Cream, sour cream, cream cheese, and creamed cottage cheese. Whole-milk cheeses. Whole or 2% milk that is liquid, evaporated, or condensed. Whole buttermilk. Cream sauce or high-fat cheese sauce. Yogurt that is made from whole milk. Fats and oils Meat fat, or shortening. Cocoa butter, hydrogenated oils, palm oil, coconut oil, palm kernel oil. Solid fats and shortenings, including bacon fat, salt pork, lard, and butter. Nondairy cream substitutes. Salad dressings with cheese or sour cream. Beverages Regular sodas and juice drinks with added sugar. Sweets and desserts Frosting. Pudding. Cookies. Cakes. Pies. Milk chocolate or white chocolate. Buttered syrups. Full-fat ice cream or ice cream drinks. The items listed above may not be a complete list of foods and drinks to avoid. Contact a dietitian for more  information. Summary Heart-healthy meal planning includes eating less unhealthy fats, eating more healthy fats, and making other changes in your diet. Eat a balanced diet. This includes fruits and vegetables, low-fat or nonfat dairy, lean protein, nuts and legumes, whole grains, and heart-healthy oils and fats. This information is not intended to replace advice given to you by your health care provider. Make sure you discuss any questions you have with your health care provider. Document Revised: 01/13/2022 Document Reviewed: 01/13/2022 Elsevier Patient Education  2024 [employer].

## 2023-07-07 NOTE — Progress Notes (Signed)
Cardiology Office Note:  .   Date:  07/07/2023  ID:  Cassandra Baker, DOB [birth year], MRN [id number] PCP: Renford Dills, MD  [facility] Providers Cardiologist:  Julien Nordmann, MD {  History of Present Illness: [name] Kitchen   [name] is a 82 y.o. female with a past medical history of CAD with recent NSTEMI that was managed medically, chronic combined systolic and diastolic heart failure, ESRD, anemia of chronic disease, type 2 DM, HTN, hypothyroidism, depression, asthma, OA. Patient was admitted in June with increasing exertional dyspnea over the preceding year with occasional vague chest discomfort. She was admitted with an NSTEMI, acute combined CHF complicated by CKD. Echocardiogram 6/13 showed EF 30-35% with global hypokinesis with severe hypokinesis of the inferior/posterior/septal wall, Grade I DD, normal RVSF, moderate MR with moderate mitral annular calcification, and an estimated right atrial pressure of . Due to CKD, cath was not pursued immediately and patient instead underwent nuclear stress test that showed EF 31%. Overall, the scan was high risk as there was evidence of a large infarct. Considered LHC, but this was deferred due to poor renal function. She was treated with 48 hours of IV heparin. Escalation of GDMT was limited by co morbidities, and she ultimately left AMA on 6/16.   Patient returned to [facility] 06/11/23, BNP remained elevated. LE edema and significant fatigue. On the morning on 06/13/23 patient developed torsades de pointes that lasted 13.5 seconds followed by ventricular bigeminy. During the episode, patient reported dizziness and blacking out. She underwent LHC on 06/15/23 that showed mildly elevated filling pressures, mild pulmonary hypertension, and normal cardiac output with no hemodynamic evidence of significant mitral regurgitation. Left heart catheterization showed severe 3V CAD, and the culprit was a subtotal occlusion of the RCA which is severely calcified in  the mid-segment as well as complex bifurcation disease at the PDA/posterior AV groove. She was started on IV heparin. After her cath, she continued to have frequent PVCs and NSVT on telemetry. She was started on amiodarone infusion. Decision was made to transfer patient to [facility] for CABG evaluation.    Not a candidate for CABG when evaluated. Dr. Okey Dupre discussed options with family including multivessel PCI that may require lithotripsy/arterectomy vs palliative medical therapy. Family decided to pursue PCI. She was seen by Nephrology on 6/25 and underwent HD that evening. Received 1 unit of PRBCs during HD, and hemoglobin improved from 7.6 to 9.2. Overnight, patient was hemodynamically stable, but did have a lot of confusion and agitation. She was treated with benzodiazepines with poor respons   On 6/26, patient underwent successful complex intravascular lithotripsy and had 2 overlapping drug-eluting stents placed to the mid-proximal right coronary artery. The patient did go into polymorphic ventricular tachycardia that lasted for about 10 seconds before starting the procedure. She converted spontaneously and did not require defibrillation. Later on 6/26, patient was transitioned from IV amiodarone to PO amiodarone and she remained on metoprolol succinate. She underwent HD on 6/27.   She was taken to the OR on 7/1 for tunneled dialysis catheter placement in the right internal jugular vein and left arm first stage brachiobasilic fistula creation. Patient ws discharged home per PT/OT recommendations.  Today, she tells me she has had some swelling in her left hand after tunneled dialysis catheter was placed.  Recommended warm compresses and elevation.  She is also had some swelling in her feet after surgery.  However, she is losing weight.  Taking 80 mg of Lasix twice a  day.  We did not increase this today.  Recommended that she elevate her legs and use lower extremity compression.  Referred her to  elastic therapy in [place].  Home PT/OT ordered today  ROS: Pertinent ROS located in HPI  Studies Reviewed: .        Right/Left Heart Catheterization 06/15/23 1.  Severe three-vessel coronary artery disease.  The culprit for myocardial infarction is likely subtotal occlusion of the right coronary artery which is heavily calcified and diffusely diseased in the proximal/mid segment.  In addition, there is complex bifurcation disease at the right PDA and right posterior AV groove artery. 2.  Mildly with severely reduced LV systolic function with an EF of 30% with severe inferior and apical hypokinesis. 3.  Right heart catheterization showed mildly elevated filling pressures, mild pulmonary hypertension and normal cardiac output.  No significant V waves noted on wedge tracing.   Recommendations: Difficult revascularization options.  The patient has surgical disease but she might not be a candidate for CABG.  The RCA is the culprit but it is diffusely diseased and heavily calcified.  Will discuss with patient and family and consider transfer to [facility] for evaluation.  If we decide to PCI, it is preferable to transfuse her to get her hemoglobin above 9 and load with an antiplatelet medication.   Diagnostic Dominance: Right    Peripheral Intravascular Lithotripsy and coronary stent intervention 06/17/23 Moderately to severely elevated left ventricular end-diastolic pressure at 32 mmHg. 2. Successful complex intravascular lithotripsy and 2 overlapped drug-eluting stent placement to the mid and proximal right coronary artery.  Very difficult procedure overall due to heavy calcifications and difficulty crossing the stenosis in the mid right coronary artery.  The distal RCA bifurcation disease appeared better than before likely due to improved upstream flow and thus I elected not to treat. 3.  The patient did go into polymorphic ventricular tachycardia that lasted for about 10 seconds before  starting the procedure.  She converted and did not require defibrillation. 4.  Difficulty sedating the patient with continuous involuntary movements of her lower extremities throughout the case.   Recommendations: Dual antiplatelet therapy for at least 12 months. I discontinued heparin drip. I switch amiodarone drip to oral. Recommend treating the LAD disease medically.  Diagnostic Dominance: Right  Intervention            Physical Exam:   VS:  BP (!) 100/58   Pulse 67   Ht 5\' 4"  (1.626 m)   Wt 166 lb (75.3 kg)   LMP  (LMP Unknown)   SpO2 95%   BMI 28.49 kg/m    Wt Readings from Last 3 Encounters:  07/07/23 166 lb (75.3 kg)  06/28/23 156 lb 14.4 oz (71.2 kg)  06/16/23 157 lb 10.1 oz (71.5 kg)    GEN: Well nourished, well developed in no acute distress NECK: No JVD; No carotid bruits CARDIAC: RRR, no murmurs, rubs, gallops RESPIRATORY:  Clear to auscultation without rales, wheezing or rhonchi  ABDOMEN: Soft, non-tender, non-distended EXTREMITIES:  No edema; No deformity   ASSESSMENT AND PLAN: .   Polymorphic VT -no further rhythm issues  CAD with recent NSTEMI -no chest pain, terrible SOB before but getting better now -tightness in her back is her anginal equivalent -elastic therapy for LE compression  HFrEF -mid sept to repeat echo -she has f/u with the AHF clinic -Continue current medication regimen  ESRD on HD -Dialysis catheter placed 2 weeks ago -Having issues with some swelling  in her left hand -Advised her to elevate and put a warm compress on it -If continues to be an issue would reach out to vascular surgery to place the catheter  Anemia of chronic disease -Being followed by Dr. Nehemiah Settle  Hypothyroidism -per endocrinology  DM2 -A1c 7.6 -Working with PCP for better control  Acute UTI -off antibiotics -no symptoms      Dispo: Please follow-up in 3 to 4 months with Dr. Mariah Baker after echocardiogram  Signed, Sharlene Dory, PA-C

## 2023-07-08 DIAGNOSIS — Z992 Dependence on renal dialysis: Secondary | ICD-10-CM | POA: Diagnosis not present

## 2023-07-08 DIAGNOSIS — N186 End stage renal disease: Secondary | ICD-10-CM | POA: Diagnosis not present

## 2023-07-08 DIAGNOSIS — N2581 Secondary hyperparathyroidism of renal origin: Secondary | ICD-10-CM | POA: Diagnosis not present

## 2023-07-08 DIAGNOSIS — D631 Anemia in chronic kidney disease: Secondary | ICD-10-CM | POA: Diagnosis not present

## 2023-07-08 NOTE — Progress Notes (Deleted)
HISTORY AND PHYSICAL     CC:  dialysis access Requesting Provider:  Renford Dills, MD  HPI: This is a 82 y.o. female here for evaluation of her hemodialysis access.  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 Karin Lieu.    The pt returns today for ***  Dialysis access history: -left BC AVF 05/12/2019 Dr. Myra Gianotti -superficialization left BC AVF 10/06/2019 Dr. 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   The pt is *** hand dominant.    Pt *** on dialysis.   Days of dialysis if applicable:  ***    HD center:  *** St location.   The pt is on a statin for cholesterol management.  The pt is on a daily aspirin.  Other AC:  Plavix The pt is on BB,  diuretic for hypertension.  The pt is  on medication for diabetes.   Tobacco hx:  never  Past Medical History:  Diagnosis Date   Anemia    Arthritis    low back,knees   Asthma    Basal cell carcinoma (BCC)    a. nose   CHF (congestive heart failure) (HCC)    a. prev eval by Dr. Katrinka Blazing in [place] - "many yrs ago."   CKD (chronic kidney disease), stage IV (HCC)    Depression    Diabetes mellitus without complication (HCC)    Dyspnea    Hypertension    Hypothyroidism     Past Surgical History:  Procedure Laterality Date   APPENDECTOMY     4 th grade   AV FISTULA PLACEMENT Left 05/12/2019   Procedure: BRACHIOCEPHALIC ARTERIOVENOUS (AV) FISTULA CREATION LEFT ARM;  Surgeon: Nada Libman, MD;  Location: [facility] OR;  Service: Vascular;  Laterality: Left;   AV FISTULA PLACEMENT Left 06/22/2023   Procedure: LEFT BRACHIOBASILIC ARTERIOVENOUS (AV) FISTULA CREATION;  Surgeon: Victorino Sparrow, MD;  Location: [facility] OR;  Service: Vascular;  Laterality: Left;   CORONARY STENT INTERVENTION N/A 06/17/2023   Procedure: CORONARY STENT INTERVENTION;  Surgeon: Iran Ouch, MD;  Location: [facility] INVASIVE CV LAB;  Service: Cardiovascular;  Laterality: N/A;   EYE SURGERY  2014   Bilateral 6 mos. apart  cataracts    FISTULA SUPERFICIALIZATION Left 10/06/2019   Procedure: ELEVATION OF ARTERIOVENOUS FISTULA LEFT ARM;  Surgeon: Nada Libman, MD;  Location: [facility] OR;  Service: Vascular;  Laterality: Left;   INSERTION OF DIALYSIS CATHETER N/A 06/22/2023   Procedure: INSERTION OF TUNNELED  DIALYSIS CATHETER USING 19CM PALINDROME CATHETER;  Surgeon: Victorino Sparrow, MD;  Location: [facility] OR;  Service: Vascular;  Laterality: N/A;   LYMPH NODE BIOPSY N/A 02/09/2018   Procedure: SENTINEL LYMPH NODE BIOPSY;  Surgeon: Adolphus Birchwood, MD;  Location: [facility] ORS;  Service: Gynecology;  Laterality: N/A;   PARATHYROIDECTOMY N/A 12/07/2014   Procedure: PARATHYROIDECTOMY;  Surgeon: Darnell Level, MD;  Location: [facility] ORS;  Service: General;  Laterality: N/A;   PERIPHERAL INTRAVASCULAR LITHOTRIPSY  06/17/2023   Procedure: PERIPHERAL INTRAVASCULAR LITHOTRIPSY;  Surgeon: Iran Ouch, MD;  Location: [facility] INVAS[facility] CV LAB;  Service: Cardiovascular;;   RIGHT/LEFT HEART CATH AND CORONARY ANGIOGRAPHY N/A 06/15/2023   Procedure: RIGHT/LEFT HEART CATH AND CORONARY ANGIOGRAPHY;  Surgeon: Iran Ouch, MD;  Location: [facility] INVASIVE CV LAB;  Service: Cardiovascular;  Laterality: N/A;   ROBOTIC ASSISTED TOTAL HYSTERECTOMY WITH BILATERAL SALPINGO OOPHERECTOMY Bilateral 02/09/2018   Procedure: XI ROBOTIC ASSISTED TOTAL HYSTERECTOMY WITH BILATERAL SALPINGO OOPHORECTOMY;  Surgeon: Adolphus Birchwood, MD;  Location: [facility] OR[facility];  Service: Gynecology;  Laterality: Bilateral;   TEMPORARY DIALYSIS CATHETER N/A 06/12/2023   Procedure: TEMPORARY DIALYSIS CATHETER;  Surgeon: Renford Dills, MD;  Location: [facility] INVASIVE CV LAB;  Service: Cardiovascular;  Laterality: N/A;   THYROID LOBECTOMY Left 12/07/2014   Procedure: LEFT THYROID LOBECTOMY;  Surgeon: Darnell Level, MD;  Location: [facility] ORS;  Service: General;  Laterality: Left;   TONSILLECTOMY     TUBAL LIGATION     Early thirties    Allergies  Allergen Reactions   Plum Pulp Anaphylaxis and Cough   Almond (Diagnostic)  Itching   Other Itching    Dairy products/Soaps & detergents   Peanut-Containing Drug Products Itching   Penicillins Rash    Tolerates ceftriaxone    Current Outpatient Medications  Medication Sig Dispense Refill   acetaminophen (TYLENOL) 500 MG tablet Take 500-1,000 mg by mouth every 6 (six) hours as needed (pain).      allopurinol (ZYLOPRIM) 100 MG tablet Take 100 mg by mouth daily as needed (For gout).     amiodarone (PACERONE) 200 MG tablet Take 1 tablet (200 mg total) by mouth daily. 90 tablet 3   aspirin EC 81 MG tablet Take 1 tablet (81 mg total) by mouth daily. Swallow whole. 30 tablet 12   atorvastatin (LIPITOR) 80 MG tablet Take 1 tablet (80 mg total) by mouth daily.     calcium acetate (PHOSLO) 667 MG tablet Take 667 mg by mouth 3 (three) times daily.     Cholecalciferol (VITAMIN D-3) 125 MCG (5000 UT) TABS Take by mouth daily at 6 (six) AM.     clopidogrel (PLAVIX) 75 MG tablet Take 1 tablet (75 mg total) by mouth daily. 90 tablet 3   Coenzyme Q10 (COQ10) 200 MG CAPS Take by mouth daily at 6 (six) AM.     FLUoxetine (PROZAC) 10 MG capsule Take 10 mg by mouth daily. At night     furosemide (LASIX) 40 MG tablet Take 80 mg by mouth 2 (two) times daily.      hydrOXYzine (ATARAX/VISTARIL) 10 MG tablet Take 10 mg by mouth at bedtime as needed for itching.     levothyroxine (SYNTHROID) 75 MCG tablet Take 75 mcg by mouth daily before breakfast.     loratadine (CLARITIN) 5 MG chewable tablet Chew 5 mg by mouth daily as needed for allergies.     metoprolol succinate (TOPROL-XL) 25 MG 24 hr tablet Take 1 tablet (25 mg total) by mouth daily. Take with or immediately following a meal. (Patient taking differently: Take 25 mg by mouth daily.)     nitroGLYCERIN (NITROSTAT) 0.4 MG SL tablet Place 1 tablet (0.4 mg total) under the tongue every 5 (five) minutes x 3 doses as needed for chest pain. 25 tablet 3   sitaGLIPtin (JANUVIA) 25 MG tablet Take 25 mg by mouth daily.     sodium bicarbonate  650 MG tablet Take 650 mg by mouth 2 (two) times daily.     No current facility-administered medications for this visit.    Family History  Problem Relation Age of Onset   Hypertension Mother        MI's beginning in 66's, died @ 78   Heart disease Mother    Hypertension Father    Heart disease Father    Heart disease Brother    Stroke Brother    Cervical cancer Maternal Aunt    Heart disease Brother    Stroke Brother     Social History  Socioeconomic History   Marital status: Married    Spouse name: Not on file   Number of children: Not on file   Years of education: Not on file   Highest education level: Not on file  Occupational History   Not on file  Tobacco Use   Smoking status: Never   Smokeless tobacco: Never  Vaping Use   Vaping status: Never Used  Substance and Sexual Activity   Alcohol use: No   Drug use: No   Sexual activity: Not Currently    Birth control/protection: Post-menopausal  Other Topics Concern   Not on file  Social History Narrative   [place]  Does not routinely exercise.   Social Determinants of Health   Financial Resource Strain: Not on file  Food Insecurity: No Food Insecurity (06/11/2023)   Hunger Vital Sign    Worried About Running Out of Food in the Last Year: Never true    Ran Out of Food in the Last Year: Never true  Transportation Needs: No Transportation Needs (06/11/2023)   PRAPARE - Administrator, Civil Service (Medical): No    Lack of Transportation (Non-Medical): No  Physical Activity: Not on file  Stress: Not on file  Social Connections: Not on file  Intimate Partner Violence: Unknown (06/11/2023)   Humiliation, Afraid, Rape, and Kick questionnaire    Fear of Current or Ex-Partner: Patient declined    Emotionally Abused: No    Physically Abused: No    Sexually Abused: No     ROS: [x]  Positive   [ ]  Negative   [ ]  All sytems reviewed and are negative *** Cardiac: []  chest pain/pressure []   SOB/DOE  Vascular: []  pain in legs while walking []  pain in feet when lying flat []  swelling in legs  Pulmonary: []  asthma []  wheezing  Neurologic: []  hx CVA/TIA  Hematologic: []  bleeding problems  GI []  GERD  GU: [x]  CKD/renal failure  []  HD---[]  M/W/F []  T/T/S  Psychiatric: []  hx of major depression  Integumentary: []  rashes []  ulcers  Constitutional: []  fever []  chills   PHYSICAL EXAMINATION:  ***   General:  WDWN female in NAD Gait: Not observed HENT: WNL Pulmonary: normal non-labored breathing  Cardiac: {Desc; regular/irreg:14544}, {With/Without:20273} carotid bruit*** Abdomen: soft, NT Skin: {With/Without:20273} rashes Vascular Exam/Pulses:   Right Left  Radial {Exam; arterial pulse strength 0-4:30167} {Exam; arterial pulse strength 0-4:30167}  Ulnar {Exam; arterial pulse strength 0-4:30167} {Exam; arterial pulse strength 0-4:30167}   Extremities:  *** Musculoskeletal: no muscle wasting or atrophy  Neurologic: A&O X 3  Non-Invasive Vascular Imaging:   Upper Extremity Vein Mapping on ***: ***   ASSESSMENT/PLAN: 82 y.o. 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 Karin Lieu.     -*** -pt *** on dialysis on *** -discussed with pt that access does not last forever and will need intervention or even new access at some point.  -pt is *** hand dominant - will plan for *** -pt *** on anticoagulation   Doreatha Massed, [facility] [phone]  Clinic MD:   Randie Heinz

## 2023-07-09 ENCOUNTER — Ambulatory Visit: Payer: Medicare Other

## 2023-07-09 ENCOUNTER — Other Ambulatory Visit: Payer: Self-pay | Admitting: *Deleted

## 2023-07-09 ENCOUNTER — Ambulatory Visit (HOSPITAL_COMMUNITY): Payer: Medicare Other | Attending: Vascular Surgery

## 2023-07-09 DIAGNOSIS — N184 Chronic kidney disease, stage 4 (severe): Secondary | ICD-10-CM

## 2023-07-10 ENCOUNTER — Encounter: Payer: Medicare Other | Admitting: Family

## 2023-07-10 DIAGNOSIS — D631 Anemia in chronic kidney disease: Secondary | ICD-10-CM | POA: Diagnosis not present

## 2023-07-10 DIAGNOSIS — Z992 Dependence on renal dialysis: Secondary | ICD-10-CM | POA: Diagnosis not present

## 2023-07-10 DIAGNOSIS — N2581 Secondary hyperparathyroidism of renal origin: Secondary | ICD-10-CM | POA: Diagnosis not present

## 2023-07-10 DIAGNOSIS — N186 End stage renal disease: Secondary | ICD-10-CM | POA: Diagnosis not present

## 2023-07-14 ENCOUNTER — Telehealth: Payer: Self-pay

## 2023-07-14 DIAGNOSIS — N184 Chronic kidney disease, stage 4 (severe): Secondary | ICD-10-CM

## 2023-07-14 NOTE — Telephone Encounter (Signed)
Caller: Patient  Concern: swelling of arm and hand, fingers cold, numbness  Pt denies any pain or discoloration. She has not been elevating much. She can get her hand warm with heating pad.  Location: left arm  Description:  no improvement since surgery  Treatments:  Instructed pt to elevate properly, keep hand exercised with stress or squeeze ball  Procedure: Dialysis Access Surgery  Resolution: Instructed to call back if symptoms perist or worsen  Next Appt: Pt to keep appt on 8/16

## 2023-07-15 DIAGNOSIS — D631 Anemia in chronic kidney disease: Secondary | ICD-10-CM | POA: Diagnosis not present

## 2023-07-15 DIAGNOSIS — Z992 Dependence on renal dialysis: Secondary | ICD-10-CM | POA: Diagnosis not present

## 2023-07-15 DIAGNOSIS — N186 End stage renal disease: Secondary | ICD-10-CM | POA: Diagnosis not present

## 2023-07-15 DIAGNOSIS — N2581 Secondary hyperparathyroidism of renal origin: Secondary | ICD-10-CM | POA: Diagnosis not present

## 2023-07-16 ENCOUNTER — Ambulatory Visit: Payer: Medicare Other | Attending: Family | Admitting: Family

## 2023-07-16 ENCOUNTER — Encounter: Payer: Self-pay | Admitting: Family

## 2023-07-16 VITALS — BP 129/44 | HR 70 | Resp 14 | Wt 160.5 lb

## 2023-07-16 DIAGNOSIS — I132 Hypertensive heart and chronic kidney disease with heart failure and with stage 5 chronic kidney disease, or end stage renal disease: Secondary | ICD-10-CM | POA: Insufficient documentation

## 2023-07-16 DIAGNOSIS — Z7902 Long term (current) use of antithrombotics/antiplatelets: Secondary | ICD-10-CM | POA: Diagnosis not present

## 2023-07-16 DIAGNOSIS — I2510 Atherosclerotic heart disease of native coronary artery without angina pectoris: Secondary | ICD-10-CM | POA: Diagnosis not present

## 2023-07-16 DIAGNOSIS — I4729 Other ventricular tachycardia: Secondary | ICD-10-CM | POA: Insufficient documentation

## 2023-07-16 DIAGNOSIS — I10 Essential (primary) hypertension: Secondary | ICD-10-CM | POA: Diagnosis not present

## 2023-07-16 DIAGNOSIS — N186 End stage renal disease: Secondary | ICD-10-CM | POA: Insufficient documentation

## 2023-07-16 DIAGNOSIS — J45909 Unspecified asthma, uncomplicated: Secondary | ICD-10-CM | POA: Insufficient documentation

## 2023-07-16 DIAGNOSIS — I3481 Nonrheumatic mitral (valve) annulus calcification: Secondary | ICD-10-CM | POA: Insufficient documentation

## 2023-07-16 DIAGNOSIS — Z79899 Other long term (current) drug therapy: Secondary | ICD-10-CM | POA: Insufficient documentation

## 2023-07-16 DIAGNOSIS — F32A Depression, unspecified: Secondary | ICD-10-CM | POA: Insufficient documentation

## 2023-07-16 DIAGNOSIS — Z955 Presence of coronary angioplasty implant and graft: Secondary | ICD-10-CM | POA: Diagnosis not present

## 2023-07-16 DIAGNOSIS — N184 Chronic kidney disease, stage 4 (severe): Secondary | ICD-10-CM | POA: Diagnosis not present

## 2023-07-16 DIAGNOSIS — E1122 Type 2 diabetes mellitus with diabetic chronic kidney disease: Secondary | ICD-10-CM | POA: Diagnosis not present

## 2023-07-16 DIAGNOSIS — I252 Old myocardial infarction: Secondary | ICD-10-CM | POA: Insufficient documentation

## 2023-07-16 DIAGNOSIS — Z992 Dependence on renal dialysis: Secondary | ICD-10-CM | POA: Diagnosis not present

## 2023-07-16 DIAGNOSIS — I25118 Atherosclerotic heart disease of native coronary artery with other forms of angina pectoris: Secondary | ICD-10-CM | POA: Diagnosis not present

## 2023-07-16 DIAGNOSIS — I5020 Unspecified systolic (congestive) heart failure: Secondary | ICD-10-CM

## 2023-07-16 NOTE — Patient Instructions (Signed)
Get stronger compression socks and wear them during the day.    Call us in the future if you need [place] for anything

## 2023-07-16 NOTE — Progress Notes (Signed)
Advanced Heart Failure Clinic Note   PCP: Renford Dills, MD (last seen within the last 6 months) Primary Cardiologist: Julien Nordmann, MD (to see 10/24)  HPI:  Cassandra Baker is a 82 y/o female with a history of CAD (NSTEMI), PCI, VT, asthma, torsades de pointes,  DM, HTN, CKD on dialysis, thyroid disease, anemia, depression and chronic heart failure.   Admitted 06/04/23 with increasing exertional dyspnea over the preceding year with occasional vague chest discomfort. Found to have NSTEMI, acute combined CHF complicated by CKD. Echocardiogram 6/13 showed EF 30-35% with global hypokinesis with severe hypokinesis of the inferior/posterior/septal wall, Grade I DD, normal RVSF, moderate MR with moderate mitral annular calcification, and an estimated right atrial pressure of . Due to CKD, cath was not pursued immediately and instead underwent nuclear stress test that showed EF 31%. Overall, the scan was high risk as there was evidence of a large infarct. Considered LHC, but this was deferred due to poor renal function. She was treated with 48 hours of IV heparin. Escalation of GDMT was limited by co morbidities, and she ultimately left AMA on 6/16.   Admitted 06/11/23 to [facility] complaining of increasing lower extremity swelling with significant fatigue. BNP 2338. She underwent temporary HD catheter placement with initiation of dialysis on 06/12/23. On the morning of 06/13/23, patient developed torsades de pointes that lasted 13.5 seconds followed by ventricular bigeminy. During the episode, patient reported dizziness and blacking out. She underwent LHC on 06/15/23 that showed mildly elevated filling pressures, mild pulmonary hypertension, and normal cardiac output with no hemodynamic evidence of significant mitral regurgitation. Left heart catheterization showed severe 3V CAD, and the culprit was a subtotal occlusion of the RCA which is severely calcified in the mid-segment as well as complex bifurcation disease  at the PDA/posterior AV groove. She was started on IV heparin. After her cath, she continued to have frequent PVCs and NSVT on telemetry. She was started on amiodarone infusion. Decision was made to transfer patient to [facility] for CABG evaluation.   Patient was transferred to [facility] on 6/25 for CABG evaluation. Patient arrived on IV heparin and IV amiodarone. Evaluated by CT surgery and was not a candidate for CABG. Dr. Okey Dupre discussed options with family including multivessel PCI that may require lithotripsy/arterectomy vs palliative medical therapy. Family decided to pursue PCI. She was seen by Nephrology on 6/25 and underwent HD that evening. Received 1 unit of PRBCs during HD, and hemoglobin improved from 7.6 to 9.2. On 6/26, patient underwent successful complex intravascular lithotripsy and had 2 overlapping drug-eluting stents placed to the mid-proximal right coronary artery. The procedure was very difficult overall due to heavy calcifications and difficulty crossing the stenosis in the mid right coronary artery. Recommended treating the LAD disease medically. She was treated with ASA, plavix, lipitor. The patient did go into polymorphic ventricular tachycardia that lasted for about 10 seconds before starting the procedure. She converted spontaneously and did not require defibrillation. Later on 6/26, patient was transitioned from IV amiodarone to PO amiodarone and she remained on metoprolol succinate. She underwent HD on 6/27. IR was consulted for image guided tunneled HD cath. Vascular surgery consulted for permanent dialysis access. She was taken to the OR on 7/1 for tunneled dialysis catheter placement in the right internal jugular vein and left arm first stage brachiobasilic fistula creation. She was seen by vascular surgery on 7/2, who reported that her left UE incision was healing well. Patient underwent HD on 7/2.   Was in  the ED 07/02/23  but LWBS  Echo 06/04/23: EF 30-35% with severe  hypokinesis of the inferior/posterior/septal wall, mild LVH, Grade I DD and moderate MR.    RHC/LHC 06/15/23:   Mid LAD lesion is 80% stenosed.   Mid Cx to Dist Cx lesion is 90% stenosed.   Ramus lesion is 60% stenosed.   Prox RCA to Mid RCA lesion is 95% stenosed.   Mid RCA to Dist RCA lesion is 60% stenosed.   RPDA lesion is 90% stenosed.   RPAV lesion is 80% stenosed.   There is moderate to severe left ventricular systolic dysfunction.   LV end diastolic pressure is mildly elevated.   The left ventricular ejection fraction is 25-35% by visual estimate.  1.  Severe three-vessel coronary artery disease.  The culprit for myocardial infarction is likely subtotal occlusion of the right coronary artery which is heavily calcified and diffusely diseased in the proximal/mid segment.  In addition, there is complex bifurcation disease at the right PDA and right posterior AV groove artery. 2.  Mildly with severely reduced LV systolic function with an EF of 30% with severe inferior and apical hypokinesis. 3.  Right heart catheterization showed mildly elevated filling pressures, mild pulmonary hypertension and normal cardiac output.  No significant V waves noted on wedge tracing.  PCI 06/17/23:   Mid LAD lesion is 80% stenosed.   Mid Cx to Dist Cx lesion is 90% stenosed.   Ramus lesion is 60% stenosed.   RPDA lesion is 90% stenosed.   Prox RCA lesion is 80% stenosed.   Prox RCA to Mid RCA lesion is 99% stenosed.   Mid RCA lesion is 70% stenosed.   RPAV lesion is 60% stenosed.   A drug-eluting stent was successfully placed using a SYNERGY XD 3.0X48.   A drug-eluting stent was successfully placed using a SYNERGY XD 3.50X16.   Post intervention, there is a 0% residual stenosis.   Post intervention, there is a 0% residual stenosis.   Post intervention, there is a 0% residual stenosis.  Moderately to severely elevated left ventricular end-diastolic pressure at 32 mmHg. 2. Successful complex  intravascular lithotripsy and 2 overlapped drug-eluting stent placement to the mid and proximal right coronary artery.  Very difficult procedure overall due to heavy calcifications and difficulty crossing the stenosis in the mid right coronary artery.  The distal RCA bifurcation disease appeared better than before likely due to improved upstream flow and thus I elected not to treat. 3.  The patient did go into polymorphic ventricular tachycardia that lasted for about 10 seconds before starting the procedure.  She converted and did not require defibrillation. 4.  Difficulty sedating the patient with continuous involuntary movements of her lower extremities throughout the case.  She presents today for her initial HF visit with a chief complaint of minimal SOB with moderate exertion. Chronic in nature. Has associated fatigue, intermittent dizziness and chronic swelling in bilateral feet. Reports sleeping well. Does elevate her legs during the day and has mild compression socks on that she wears.   She started dialysis ~ 2 weeks ago and has sessions on M, W, F. Reports having difficulty with clotting of the line that they are working to address. Not adding salt to her food.   Review of Systems: [y] = yes, [ ]  = no   General: Weight gain [ ] ; Weight loss [ ] ; Anorexia [ ] ; Fatigue Cove.Etienne ]; Fever [ ] ; Chills [ ] ; Weakness [ ]   Cardiac: Chest pain/pressure [ ] ;  Resting SOB [ ] ; Exertional SOB Cove.Etienne ]; Orthopnea [ ] ; Pedal Edema  Cove.Etienne ]; Palpitations [ ] ; Syncope [ ] ; Presyncope [ ] ; Paroxysmal nocturnal dyspnea[ ]   Pulmonary: Cough [ ] ; Wheezing[ ] ; Hemoptysis[ ] ; Sputum [ ] ; Snoring [ ]   GI: Vomiting[ ] ; Dysphagia[ ] ; Melena[ ] ; Hematochezia [ ] ; Heartburn[ ] ; Abdominal pain [ ] ; Constipation [ ] ; Diarrhea [ ] ; BRBPR [ ]   GU: Hematuria[ ] ; Dysuria [ ] ; Nocturia[ ]   Vascular: Pain in legs with walking [ ] ; Pain in feet with lying flat [ ] ; Non-healing sores [ ] ; Stroke [ ] ; TIA [ ] ; Slurred speech [ ] ;  Neuro:  Headaches[ ] ; Vertigo[ ] ; Seizures[ ] ; Paresthesias[ ] ;Blurred vision [ ] ; Diplopia [ ] ; Vision changes [ ]   Ortho/Skin: Arthritis [ ] ; Joint pain [ ] ; Muscle pain [ ] ; Joint swelling [ ] ; Back Pain [ ] ; Rash [ ]   Psych: Depression[ ] ; Anxiety[ ]   Heme: Bleeding problems [ ] ; Clotting disorders [ ] ; Anemia [ ]   Endocrine: Diabetes [ ] ; Thyroid dysfunction[ ]    Past Medical History:  Diagnosis Date   Anemia    Arthritis    low back,knees   Asthma    Basal cell carcinoma (BCC)    a. nose   CHF (congestive heart failure) (HCC)    a. prev eval by Dr. Katrinka Blazing in [place] - "many yrs ago."   CKD (chronic kidney disease), stage IV (HCC)    Depression    Diabetes mellitus without complication (HCC)    Dyspnea    Hypertension    Hypothyroidism     Current Outpatient Medications  Medication Sig Dispense Refill   acetaminophen (TYLENOL) 500 MG tablet Take 500-1,000 mg by mouth every 6 (six) hours as needed (pain).      allopurinol (ZYLOPRIM) 100 MG tablet Take 100 mg by mouth daily as needed (For gout).     amiodarone (PACERONE) 200 MG tablet Take 1 tablet (200 mg total) by mouth daily. 90 tablet 3   aspirin EC 81 MG tablet Take 1 tablet (81 mg total) by mouth daily. Swallow whole. 30 tablet 12   atorvastatin (LIPITOR) 80 MG tablet Take 1 tablet (80 mg total) by mouth daily.     calcium acetate (PHOSLO) 667 MG tablet Take 667 mg by mouth 3 (three) times daily.     Cholecalciferol (VITAMIN D-3) 125 MCG (5000 UT) TABS Take by mouth daily at 6 (six) AM.     clopidogrel (PLAVIX) 75 MG tablet Take 1 tablet (75 mg total) by mouth daily. 90 tablet 3   Coenzyme Q10 (COQ10) 200 MG CAPS Take by mouth daily at 6 (six) AM.     FLUoxetine (PROZAC) 10 MG capsule Take 10 mg by mouth daily. At night     furosemide (LASIX) 40 MG tablet Take 80 mg by mouth 2 (two) times daily.      hydrOXYzine (ATARAX/VISTARIL) 10 MG tablet Take 10 mg by mouth at bedtime as needed for itching.     levothyroxine (SYNTHROID) 75 MCG  tablet Take 75 mcg by mouth daily before breakfast.     loratadine (CLARITIN) 5 MG chewable tablet Chew 5 mg by mouth daily as needed for allergies.     metoprolol succinate (TOPROL-XL) 25 MG 24 hr tablet Take 1 tablet (25 mg total) by mouth daily. Take with or immediately following a meal. (Patient taking differently: Take 25 mg by mouth daily.)     nitroGLYCERIN (NITROSTAT) 0.4 MG SL tablet Place 1  tablet (0.4 mg total) under the tongue every 5 (five) minutes x 3 doses as needed for chest pain. 25 tablet 3   sitaGLIPtin (JANUVIA) 25 MG tablet Take 25 mg by mouth daily.     sodium bicarbonate 650 MG tablet Take 650 mg by mouth 2 (two) times daily.     No current facility-administered medications for this visit.    Allergies  Allergen Reactions   Plum Pulp Anaphylaxis and Cough   Almond (Diagnostic) Itching   Other Itching    Dairy products/Soaps & detergents   Peanut-Containing Drug Products Itching   Penicillins Rash    Tolerates ceftriaxone      Social History   Socioeconomic History   Marital status: Married    Spouse name: Not on file   Number of children: Not on file   Years of education: Not on file   Highest education level: Not on file  Occupational History   Not on file  Tobacco Use   Smoking status: Never   Smokeless tobacco: Never  Vaping Use   Vaping status: Never Used  Substance and Sexual Activity   Alcohol use: No   Drug use: No   Sexual activity: Not Currently    Birth control/protection: Post-menopausal  Other Topics Concern   Not on file  Social History Narrative   Lives in [place].  Does not routinely exercise.   Social Determinants of Health   Financial Resource Strain: Not on file  Food Insecurity: No Food Insecurity (06/11/2023)   Hunger Vital Sign    Worried About Running Out of Food in the Last Year: Never true    Ran Out of Food in the Last Year: Never true  Transportation Needs: No Transportation Needs (06/11/2023)   PRAPARE -  Administrator, Civil Service (Medical): No    Lack of Transportation (Non-Medical): No  Physical Activity: Not on file  Stress: Not on file  Social Connections: Not on file  Intimate Partner Violence: Unknown (06/11/2023)   Humiliation, Afraid, Rape, and Kick questionnaire    Fear of Current or Ex-Partner: Patient declined    Emotionally Abused: No    Physically Abused: No    Sexually Abused: No      Family History  Problem Relation Age of Onset   Hypertension Mother        MI's beginning in 7's, died @ 33   Heart disease Mother    Hypertension Father    Heart disease Father    Heart disease Brother    Stroke Brother    Cervical cancer Maternal Aunt    Heart disease Brother    Stroke Brother    Vitals:   07/16/23 1434  BP: (!) 129/44  Pulse: 70  Resp: 14  SpO2: 99%  Weight: 160 lb 8 oz (72.8 kg)   Wt Readings from Last 3 Encounters:  07/16/23 160 lb 8 oz (72.8 kg)  07/07/23 166 lb (75.3 kg)  06/28/23 156 lb 14.4 oz (71.2 kg)   Lab Results  Component Value Date   CREATININE 3.61 (H) 07/02/2023   CREATININE 4.07 (H) 06/27/2023   CREATININE 4.44 (H) 06/25/2023    PHYSICAL EXAM: General:  Well appearing. No respiratory difficulty HEENT: normal Neck: supple. no JVD. No lymphadenopathy or thyromegaly appreciated. Cor: PMI nondisplaced. Regular rate & rhythm. No rubs, gallops or murmurs. Lungs: clear Abdomen: soft, nontender, nondistended. No hepatosplenomegaly. No bruits or masses.  Extremities: no cyanosis, clubbing, rash, 2+ pitting edema bilateral lower legs Neuro:  alert & oriented x 3, cranial nerves grossly intact. moves all 4 extremities w/o difficulty. Affect pleasant.  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   - continue furosemide 80mg  BID - get moderate compression socks and put them on every morning with removal at bedtime - elevate legs when sitting for long periods of time - BNP 07/02/23 was > 4500.0  2: HTN- - BP 129/44 - saw PCP ([facility]) within the last 6 months - BMP 07/02/23 showed sodium 133, potassium 3.6, creatinine 3.61 & GFR 12  3: CAD- - saw cardiology Asa Lente) 07/24; returns to see Dr. 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   Mid Cx to Dist Cx lesion is 90% stenosed.   Ramus lesion is 60% stenosed.   RPDA lesion is 90% stenosed.   Prox RCA lesion is 80% stenosed.   Prox RCA to Mid RCA lesion is 99% stenosed.   Mid RCA lesion is 70% stenosed.   RPAV lesion is 60% stenosed.   A drug-eluting stent was successfully placed using a SYNERGY XD 3.0X48.   A drug-eluting stent was successfully placed using a SYNERGY XD 3.50X16.   Post intervention, there is a 0% residual stenosis.   Post intervention, there is a 0% residual stenosis.   Post intervention, there is a 0% residual stenosis.  Moderately to severely elevated left ventricular end-diastolic pressure at 32 mmHg. 2. Successful complex intravascular lithotripsy and 2 overlapped drug-eluting stent placement to the mid and proximal right coronary artery.  Very difficult procedure overall due to heavy calcifications and difficulty crossing the stenosis in the mid right coronary artery.  The distal RCA bifurcation disease appeared better than before likely due to improved upstream flow and thus I elected not to treat. 3.  The patient did go into polymorphic ventricular tachycardia that lasted for about 10 seconds before starting the procedure.  She converted and did not require defibrillation. 4.  Difficulty sedating the patient with continuous involuntary movements of her lower  extremities throughout the case.  4: CKD/ ESRD- - dialysis M, W, F  Due to patient being on dialysis, will not make a return appt at the HF Clinic at this time. Advised patient to follow closely with cardiology but that she could call back at anytime for questions or to make another appointment and she was comfortable with this plan.    Delma Freeze, FNP 07/16/23

## 2023-07-17 DIAGNOSIS — N2581 Secondary hyperparathyroidism of renal origin: Secondary | ICD-10-CM | POA: Diagnosis not present

## 2023-07-17 DIAGNOSIS — D631 Anemia in chronic kidney disease: Secondary | ICD-10-CM | POA: Diagnosis not present

## 2023-07-17 DIAGNOSIS — Z992 Dependence on renal dialysis: Secondary | ICD-10-CM | POA: Diagnosis not present

## 2023-07-17 DIAGNOSIS — N186 End stage renal disease: Secondary | ICD-10-CM | POA: Diagnosis not present

## 2023-07-21 DIAGNOSIS — N186 End stage renal disease: Secondary | ICD-10-CM | POA: Diagnosis not present

## 2023-07-21 DIAGNOSIS — N2581 Secondary hyperparathyroidism of renal origin: Secondary | ICD-10-CM | POA: Diagnosis not present

## 2023-07-21 DIAGNOSIS — D631 Anemia in chronic kidney disease: Secondary | ICD-10-CM | POA: Diagnosis not present

## 2023-07-21 DIAGNOSIS — Z992 Dependence on renal dialysis: Secondary | ICD-10-CM | POA: Diagnosis not present

## 2023-07-22 DIAGNOSIS — Z992 Dependence on renal dialysis: Secondary | ICD-10-CM | POA: Diagnosis not present

## 2023-07-22 DIAGNOSIS — N186 End stage renal disease: Secondary | ICD-10-CM | POA: Diagnosis not present

## 2023-07-22 DIAGNOSIS — N2581 Secondary hyperparathyroidism of renal origin: Secondary | ICD-10-CM | POA: Diagnosis not present

## 2023-07-22 DIAGNOSIS — D631 Anemia in chronic kidney disease: Secondary | ICD-10-CM | POA: Diagnosis not present

## 2023-07-23 DIAGNOSIS — E1122 Type 2 diabetes mellitus with diabetic chronic kidney disease: Secondary | ICD-10-CM | POA: Diagnosis not present

## 2023-07-23 DIAGNOSIS — N186 End stage renal disease: Secondary | ICD-10-CM | POA: Diagnosis not present

## 2023-07-23 DIAGNOSIS — Z992 Dependence on renal dialysis: Secondary | ICD-10-CM | POA: Diagnosis not present

## 2023-07-24 DIAGNOSIS — N2581 Secondary hyperparathyroidism of renal origin: Secondary | ICD-10-CM | POA: Diagnosis not present

## 2023-07-24 DIAGNOSIS — Z992 Dependence on renal dialysis: Secondary | ICD-10-CM | POA: Diagnosis not present

## 2023-07-24 DIAGNOSIS — N186 End stage renal disease: Secondary | ICD-10-CM | POA: Diagnosis not present

## 2023-07-24 DIAGNOSIS — D631 Anemia in chronic kidney disease: Secondary | ICD-10-CM | POA: Diagnosis not present

## 2023-07-27 DIAGNOSIS — Z992 Dependence on renal dialysis: Secondary | ICD-10-CM | POA: Diagnosis not present

## 2023-07-27 DIAGNOSIS — D689 Coagulation defect, unspecified: Secondary | ICD-10-CM | POA: Insufficient documentation

## 2023-07-27 DIAGNOSIS — N186 End stage renal disease: Secondary | ICD-10-CM | POA: Diagnosis not present

## 2023-07-27 DIAGNOSIS — D631 Anemia in chronic kidney disease: Secondary | ICD-10-CM | POA: Diagnosis not present

## 2023-07-27 DIAGNOSIS — N2581 Secondary hyperparathyroidism of renal origin: Secondary | ICD-10-CM | POA: Diagnosis not present

## 2023-07-29 DIAGNOSIS — N186 End stage renal disease: Secondary | ICD-10-CM | POA: Diagnosis not present

## 2023-07-29 DIAGNOSIS — Z992 Dependence on renal dialysis: Secondary | ICD-10-CM | POA: Diagnosis not present

## 2023-07-29 DIAGNOSIS — N2581 Secondary hyperparathyroidism of renal origin: Secondary | ICD-10-CM | POA: Diagnosis not present

## 2023-07-29 DIAGNOSIS — D631 Anemia in chronic kidney disease: Secondary | ICD-10-CM | POA: Diagnosis not present

## 2023-07-30 ENCOUNTER — Ambulatory Visit (HOSPITAL_COMMUNITY): Payer: Medicare Other

## 2023-07-31 DIAGNOSIS — D631 Anemia in chronic kidney disease: Secondary | ICD-10-CM | POA: Diagnosis not present

## 2023-07-31 DIAGNOSIS — N186 End stage renal disease: Secondary | ICD-10-CM | POA: Diagnosis not present

## 2023-07-31 DIAGNOSIS — Z992 Dependence on renal dialysis: Secondary | ICD-10-CM | POA: Diagnosis not present

## 2023-07-31 DIAGNOSIS — N2581 Secondary hyperparathyroidism of renal origin: Secondary | ICD-10-CM | POA: Diagnosis not present

## 2023-08-03 DIAGNOSIS — D631 Anemia in chronic kidney disease: Secondary | ICD-10-CM | POA: Diagnosis not present

## 2023-08-03 DIAGNOSIS — Z992 Dependence on renal dialysis: Secondary | ICD-10-CM | POA: Diagnosis not present

## 2023-08-03 DIAGNOSIS — N186 End stage renal disease: Secondary | ICD-10-CM | POA: Diagnosis not present

## 2023-08-03 DIAGNOSIS — N2581 Secondary hyperparathyroidism of renal origin: Secondary | ICD-10-CM | POA: Diagnosis not present

## 2023-08-05 DIAGNOSIS — D631 Anemia in chronic kidney disease: Secondary | ICD-10-CM | POA: Diagnosis not present

## 2023-08-05 DIAGNOSIS — N2581 Secondary hyperparathyroidism of renal origin: Secondary | ICD-10-CM | POA: Diagnosis not present

## 2023-08-05 DIAGNOSIS — N186 End stage renal disease: Secondary | ICD-10-CM | POA: Diagnosis not present

## 2023-08-05 DIAGNOSIS — Z992 Dependence on renal dialysis: Secondary | ICD-10-CM | POA: Diagnosis not present

## 2023-08-07 ENCOUNTER — Ambulatory Visit (HOSPITAL_COMMUNITY)
Admission: RE | Admit: 2023-08-07 | Discharge: 2023-08-07 | Disposition: A | Discharge status: Home / Self Care | Payer: Medicare Other | Source: Ambulatory Visit | Attending: Vascular Surgery | Admitting: Vascular Surgery

## 2023-08-07 ENCOUNTER — Ambulatory Visit (INDEPENDENT_AMBULATORY_CARE_PROVIDER_SITE_OTHER): Payer: Medicare Other | Admitting: Physician Assistant

## 2023-08-07 VITALS — BP 136/67 | HR 78 | Temp 97.3°F | Resp 20 | Ht 64.0 in | Wt 151.5 lb

## 2023-08-07 DIAGNOSIS — N186 End stage renal disease: Secondary | ICD-10-CM

## 2023-08-07 DIAGNOSIS — Z992 Dependence on renal dialysis: Secondary | ICD-10-CM

## 2023-08-07 DIAGNOSIS — N184 Chronic kidney disease, stage 4 (severe): Secondary | ICD-10-CM | POA: Diagnosis not present

## 2023-08-07 NOTE — Progress Notes (Signed)
POST OPERATIVE OFFICE NOTE    CC:  F/u for surgery  HPI:  Cassandra Baker is a 82 y.o. female who is s/p left brachiobasilic fistula creation on 06/22/2023 by Dr.Robins. This was done for permanent dialysis access. She has a history of left brachiocephalic fistula creation in 2020 however this occluded.  Pt returns today for follow up. She denies any issues with her left arm incision such as erythema, drainage, or swelling. She denies any symptoms of steal such as weakness, excessive coldness, or left hand pain. She did have some left arm swelling for about a week after her surgery, which caused some left forearm numbness. Her swelling and numbness have been resolved for over a month.  She dialyzes on T/Th/Sat via right internal jugular TDC.   Allergies  Allergen Reactions   Plum Pulp Anaphylaxis and Cough   Almond (Diagnostic) Itching   Other Itching    Dairy products/Soaps & detergents   Peanut-Containing Drug Products Itching   Penicillins Rash    Tolerates ceftriaxone    Current Outpatient Medications  Medication Sig Dispense Refill   acetaminophen (TYLENOL) 500 MG tablet Take 500-1,000 mg by mouth every 6 (six) hours as needed (pain).      allopurinol (ZYLOPRIM) 100 MG tablet Take 100 mg by mouth daily as needed (For gout).     amiodarone (PACERONE) 200 MG tablet Take 1 tablet (200 mg total) by mouth daily. 90 tablet 3   aspirin EC 81 MG tablet Take 1 tablet (81 mg total) by mouth daily. Swallow whole. 30 tablet 12   atorvastatin (LIPITOR) 80 MG tablet Take 1 tablet (80 mg total) by mouth daily. (Patient not taking: Reported on 07/16/2023)     calcium acetate (PHOSLO) 667 MG tablet Take 667 mg by mouth 3 (three) times daily.     Cholecalciferol (VITAMIN D-3) 125 MCG (5000 UT) TABS Take by mouth daily at 6 (six) AM.     clopidogrel (PLAVIX) 75 MG tablet Take 1 tablet (75 mg total) by mouth daily. 90 tablet 3   Coenzyme Q10 (COQ10) 200 MG CAPS Take by mouth daily at 6 (six) AM. (Patient  not taking: Reported on 07/16/2023)     FLUoxetine (PROZAC) 10 MG capsule Take 10 mg by mouth daily. At night (Patient not taking: Reported on 07/16/2023)     furosemide (LASIX) 40 MG tablet Take 80 mg by mouth 2 (two) times daily.      levothyroxine (SYNTHROID) 75 MCG tablet Take 75 mcg by mouth daily before breakfast.     loratadine (CLARITIN) 5 MG chewable tablet Chew 5 mg by mouth daily as needed for allergies.     metoprolol succinate (TOPROL-XL) 25 MG 24 hr tablet Take 1 tablet (25 mg total) by mouth daily. Take with or immediately following a meal. (Patient not taking: Reported on 07/16/2023)     nitroGLYCERIN (NITROSTAT) 0.4 MG SL tablet Place 1 tablet (0.4 mg total) under the tongue every 5 (five) minutes x 3 doses as needed for chest pain. 25 tablet 3   sitaGLIPtin (JANUVIA) 25 MG tablet Take 25 mg by mouth daily.     sodium bicarbonate 650 MG tablet Take 650 mg by mouth 2 (two) times daily.     No current facility-administered medications for this visit.     ROS:  See HPI  Physical Exam:  Incision:  Left arm incision well healed without signs of infection Extremities:  Left brachiobasilic fistula with palpable thrill throughout upper arm. Palpable left radial pulse  Neuro: intact motor and sensation of LUE  Studies: Dialysis Duplex (08/07/2023) +--------------------+----------+-----------------+--------+  AVF                PSV (cm/s)Flow Vol (mL/min)Comments  +--------------------+----------+-----------------+--------+  Native artery inflow   301          1400                 +--------------------+----------+-----------------+--------+  AVF Anastomosis        456                               +--------------------+----------+-----------------+--------+     +------------+----------+-------------+----------+--------+  OUTFLOW VEINPSV (cm/s)Diameter (cm)Depth (cm)Describe  +------------+----------+-------------+----------+--------+  Prox UA         85         1.05        1.65             +------------+----------+-------------+----------+--------+  Mid UA         152        0.86        0.13             +------------+----------+-------------+----------+--------+  Dist UA        179        0.69        1.25             +------------+----------+-------------+----------+--------+  AC Fossa       878        0.69        1.25             +------------+----------+-------------+----------+--------+    Assessment/Plan:  This is a 82 y.o. female who is here for a post op visit  -She underwent creation of left brachiobasilic fistula on 06/22/2023 by Dr.Robins for permanent dialysis access -Her left arm incision is well healed without signs of infection -Duplex demonstrates a well matured fistula with great flow volume of 1400 ml/min. The fistula is >95mm throughout the arm. On exam the fistula has a great thrill throughout the upper arm -She has no symptoms of steal such as left hand pain, coldness, or weakness. She has a palpable left radial pulse -We will schedule her for a left 2nd stage basilic fistula surgery in the next 2-3 weeks with Dr.Robins. This will be done on a T/Th.    Loel Dubonnet, PA-C [facility] [phone]  Clinic MD:  Chestine Spore

## 2023-08-10 DIAGNOSIS — N2581 Secondary hyperparathyroidism of renal origin: Secondary | ICD-10-CM | POA: Diagnosis not present

## 2023-08-10 DIAGNOSIS — Z992 Dependence on renal dialysis: Secondary | ICD-10-CM | POA: Diagnosis not present

## 2023-08-10 DIAGNOSIS — D631 Anemia in chronic kidney disease: Secondary | ICD-10-CM | POA: Diagnosis not present

## 2023-08-10 DIAGNOSIS — N186 End stage renal disease: Secondary | ICD-10-CM | POA: Diagnosis not present

## 2023-08-12 DIAGNOSIS — N2581 Secondary hyperparathyroidism of renal origin: Secondary | ICD-10-CM | POA: Diagnosis not present

## 2023-08-12 DIAGNOSIS — Z992 Dependence on renal dialysis: Secondary | ICD-10-CM | POA: Diagnosis not present

## 2023-08-12 DIAGNOSIS — D631 Anemia in chronic kidney disease: Secondary | ICD-10-CM | POA: Diagnosis not present

## 2023-08-12 DIAGNOSIS — N186 End stage renal disease: Secondary | ICD-10-CM | POA: Diagnosis not present

## 2023-08-13 ENCOUNTER — Other Ambulatory Visit: Payer: Self-pay | Admitting: *Deleted

## 2023-08-13 ENCOUNTER — Encounter: Payer: Self-pay | Admitting: *Deleted

## 2023-08-13 DIAGNOSIS — N186 End stage renal disease: Secondary | ICD-10-CM

## 2023-08-14 DIAGNOSIS — Z992 Dependence on renal dialysis: Secondary | ICD-10-CM | POA: Diagnosis not present

## 2023-08-14 DIAGNOSIS — D631 Anemia in chronic kidney disease: Secondary | ICD-10-CM | POA: Diagnosis not present

## 2023-08-14 DIAGNOSIS — N2581 Secondary hyperparathyroidism of renal origin: Secondary | ICD-10-CM | POA: Diagnosis not present

## 2023-08-14 DIAGNOSIS — N186 End stage renal disease: Secondary | ICD-10-CM | POA: Diagnosis not present

## 2023-08-17 DIAGNOSIS — N2581 Secondary hyperparathyroidism of renal origin: Secondary | ICD-10-CM | POA: Diagnosis not present

## 2023-08-17 DIAGNOSIS — D631 Anemia in chronic kidney disease: Secondary | ICD-10-CM | POA: Diagnosis not present

## 2023-08-17 DIAGNOSIS — Z992 Dependence on renal dialysis: Secondary | ICD-10-CM | POA: Diagnosis not present

## 2023-08-17 DIAGNOSIS — N186 End stage renal disease: Secondary | ICD-10-CM | POA: Diagnosis not present

## 2023-08-18 DIAGNOSIS — I2510 Atherosclerotic heart disease of native coronary artery without angina pectoris: Secondary | ICD-10-CM | POA: Diagnosis not present

## 2023-08-18 DIAGNOSIS — I10 Essential (primary) hypertension: Secondary | ICD-10-CM | POA: Diagnosis not present

## 2023-08-18 DIAGNOSIS — N186 End stage renal disease: Secondary | ICD-10-CM | POA: Diagnosis not present

## 2023-08-18 DIAGNOSIS — E7800 Pure hypercholesterolemia, unspecified: Secondary | ICD-10-CM | POA: Diagnosis not present

## 2023-08-18 DIAGNOSIS — E890 Postprocedural hypothyroidism: Secondary | ICD-10-CM | POA: Diagnosis not present

## 2023-08-18 DIAGNOSIS — E1165 Type 2 diabetes mellitus with hyperglycemia: Secondary | ICD-10-CM | POA: Diagnosis not present

## 2023-08-18 DIAGNOSIS — Z992 Dependence on renal dialysis: Secondary | ICD-10-CM | POA: Diagnosis not present

## 2023-08-18 DIAGNOSIS — N189 Chronic kidney disease, unspecified: Secondary | ICD-10-CM | POA: Diagnosis not present

## 2023-08-19 DIAGNOSIS — N2581 Secondary hyperparathyroidism of renal origin: Secondary | ICD-10-CM | POA: Diagnosis not present

## 2023-08-19 DIAGNOSIS — Z992 Dependence on renal dialysis: Secondary | ICD-10-CM | POA: Diagnosis not present

## 2023-08-19 DIAGNOSIS — D631 Anemia in chronic kidney disease: Secondary | ICD-10-CM | POA: Diagnosis not present

## 2023-08-19 DIAGNOSIS — N186 End stage renal disease: Secondary | ICD-10-CM | POA: Diagnosis not present

## 2023-08-20 ENCOUNTER — Ambulatory Visit (HOSPITAL_COMMUNITY): Payer: Medicare Other | Attending: Physician Assistant

## 2023-08-20 DIAGNOSIS — I428 Other cardiomyopathies: Secondary | ICD-10-CM | POA: Diagnosis not present

## 2023-08-20 LAB — ECHOCARDIOGRAM COMPLETE
AR max vel: 1.38 cm2
AV Area VTI: 1.15 cm2
AV Area mean vel: 1.31 cm2
AV Mean grad: 16 mmHg
AV Peak grad: 25.6 mmHg
Ao pk vel: 2.53 m/s
Area-P 1/2: 3.34 cm2
S' Lateral: 3 cm

## 2023-08-21 DIAGNOSIS — N186 End stage renal disease: Secondary | ICD-10-CM | POA: Diagnosis not present

## 2023-08-21 DIAGNOSIS — Z992 Dependence on renal dialysis: Secondary | ICD-10-CM | POA: Diagnosis not present

## 2023-08-21 DIAGNOSIS — N2581 Secondary hyperparathyroidism of renal origin: Secondary | ICD-10-CM | POA: Diagnosis not present

## 2023-08-21 DIAGNOSIS — D631 Anemia in chronic kidney disease: Secondary | ICD-10-CM | POA: Diagnosis not present

## 2023-08-23 DIAGNOSIS — Z992 Dependence on renal dialysis: Secondary | ICD-10-CM | POA: Diagnosis not present

## 2023-08-23 DIAGNOSIS — E1122 Type 2 diabetes mellitus with diabetic chronic kidney disease: Secondary | ICD-10-CM | POA: Diagnosis not present

## 2023-08-23 DIAGNOSIS — N186 End stage renal disease: Secondary | ICD-10-CM | POA: Diagnosis not present

## 2023-08-24 DIAGNOSIS — N186 End stage renal disease: Secondary | ICD-10-CM | POA: Diagnosis not present

## 2023-08-24 DIAGNOSIS — Z992 Dependence on renal dialysis: Secondary | ICD-10-CM | POA: Diagnosis not present

## 2023-08-24 DIAGNOSIS — N2581 Secondary hyperparathyroidism of renal origin: Secondary | ICD-10-CM | POA: Diagnosis not present

## 2023-08-26 DIAGNOSIS — Z992 Dependence on renal dialysis: Secondary | ICD-10-CM | POA: Diagnosis not present

## 2023-08-26 DIAGNOSIS — N2581 Secondary hyperparathyroidism of renal origin: Secondary | ICD-10-CM | POA: Diagnosis not present

## 2023-08-26 DIAGNOSIS — N186 End stage renal disease: Secondary | ICD-10-CM | POA: Diagnosis not present

## 2023-08-27 ENCOUNTER — Other Ambulatory Visit (HOSPITAL_COMMUNITY): Payer: Medicare Other

## 2023-08-27 ENCOUNTER — Ambulatory Visit: Payer: Medicare Other

## 2023-08-30 NOTE — Progress Notes (Incomplete)
PCP - *** Cardiologist - Antonieta Iba, MD  EKG - 06/18/2023 Chest x-ray - 07/02/2023 ECHO - 08/20/2023 Cardiac Cath - 06/17/2023  Sleep Study- CPAP - ***  DM Type !! Fasting Blood Sugar:  *** Checks Blood Sugar:  ***/day  Blood Thinner Instructions: Plavix Aspirin Instructions: Asa  ERAS Protcol - No.  NPO ordered COVID TEST- N/A  Anesthesia review: Yes cardiac hx  -------------  SDW INSTRUCTIONS:  Your procedure is scheduled on Tuesday September 10 th. Please report to [facility] Main Entrance "A" at 0530 A.M., and check in at the Admitting office. Call this number if you have problems the morning of surgery: [phone]   Remember: Do not eat or drink after midnight the night before your surgery     Medications to take morning of surgery with a sip of water include: amiodarone (PACERONE)  levothyroxine (SYNTHROID)  sodium bicarbonate 650 MG tablet   IF NEEDED TAKE acetaminophen (TYLENOL)  allopurinol (ZYLOPRIM)  loratadine (CLARITIN)  nitroGLYCERIN (NITROSTAT) Call us if you need to take it.  As of today, STOP taking any Aspirin (unless otherwise instructed by your surgeon), Aleve, Naproxen, Ibuprofen, Motrin, Advil, Goody's, BC's, all herbal medications, fish oil, and all vitamins.    The Morning of Surgery Do not wear jewelry, make-up or nail polish. Do not wear lotions, powders, or perfumes/colognes, or deodorant Do not bring valuables to the hospital. [facility] is not responsible for any belongings or valuables.  If you are a smoker, DO NOT Smoke 24 hours prior to surgery  If you wear a CPAP at night please bring your mask the morning of surgery   Remember that you must have someone to transport you home after your surgery, and remain with you for 24 hours if you are discharged the same day.  Please bring cases for contacts, glasses, hearing aids, dentures or bridgework because it cannot be worn into surgery.   Patients discharged the day of  surgery will not be allowed to drive home.   Please shower the NIGHT BEFORE/MORNING OF SURGERY (use antibacterial soap like DIAL soap if possible). Wear comfortable clothes the morning of surgery. Oral Hygiene is also important to reduce your risk of infection.  Remember - BRUSH YOUR TEETH THE MORNING OF SURGERY WITH YOUR REGULAR TOOTHPASTE  Patient denies shortness of breath, fever, cough and chest pain.

## 2023-08-31 ENCOUNTER — Telehealth: Payer: Self-pay

## 2023-08-31 ENCOUNTER — Encounter (HOSPITAL_COMMUNITY): Payer: Self-pay | Admitting: Physician Assistant

## 2023-08-31 NOTE — Telephone Encounter (Signed)
   Name:  Cassandra Baker  DOB:  05-Nov-1941  MRN:  [id number]   Primary Cardiologist: Julien Nordmann, MD  Chart reviewed as part of pre-operative protocol coverage. Patient was contacted 08/31/2023 in reference to pre-operative risk assessment for pending surgery as outlined below.  [name] was last seen on 07/07/2023 by Jari Favre, PA for posthospitalization follow-up.    She had overlapping stent placement with intravascular lithotripsy to the mid and proximal right coronary artery.  She is now on dual antiplatelet therapy and will need to be on this for a minimum of 6 months for preferably 1 year, before any elective procedures to allow for cardiovascular endothelialization.  Surgery should be postponed until January 2025, unless emergent.  Would need to be seen in the office for further recommendations due to extensive stent burden.  Joni Reining, NP 08/31/2023, 4:04 PM

## 2023-08-31 NOTE — Telephone Encounter (Signed)
Patient is scheduled for a left arm basilic fistula transposition and superficialization on 9/10. On 6/26 patient underwent intravascular lithotripsy and two overlapping drug-eluting stents to the mid-proximal right coronary artery. Therefore, patient will need to continue Plavix. Contacted patient to make her aware surgery will need to be cancelled for now and will reach out to cardiology to advise when safe for her to stop Plavix for 5 days for surgery. We will get her rescheduled when this is possible. Patient verbalized understanding.

## 2023-08-31 NOTE — Telephone Encounter (Signed)
   Pre-operative Risk Assessment    Patient Name: Cassandra Baker  DOB: [birth year] MRN: [id number]   Last OV 07/16/23 with Clarisa Kindred, FNP Next OV 10/13/23 with Dr. Mariah Milling   Request for Surgical Clearance    Procedure:   Left Arm Basilic Fistula Transposition & Superficialization  Date of Surgery:  Clearance TBD                                 Surgeon:  Gerarda Fraction, MD Surgeon's Group or Practice Name:  [facility] Phone number:  [phone] Fax number:  [phone]   Type of Clearance Requested:   - Medical  - Pharmacy:  Hold Aspirin and Clopidogrel (Plavix) pt will need instructions on when/if to hold   Type of Anesthesia:  Not Indicated   Additional requests/questions:    Wynetta Fines   08/31/2023, 2:37 PM

## 2023-08-31 NOTE — Progress Notes (Addendum)
Anesthesia Chart Review: Same day workup  82 y.o. female with a past medical history of CAD with recent NSTEMI that was managed medically, chronic combined systolic and diastolic heart failure, ESRD, anemia of chronic disease, non-insulin-dependent DM2 (A1c 6.4 on 06/04/2023), HTN, hypothyroidism, depression, asthma. Patient was admitted in June with increasing exertional dyspnea over the preceding year with occasional vague chest discomfort. She was admitted with an NSTEMI, acute combined CHF complicated by CKD. Echocardiogram 6/13 showed EF 30-35% with global hypokinesis with severe hypokinesis of the inferior/posterior/septal wall, Grade I DD, normal RVSF, moderate MR with moderate mitral annular calcification, and an estimated right atrial pressure of . Due to CKD, cath was not pursued immediately and patient instead underwent nuclear stress test that showed EF 31%. Overall, the scan was high risk as there was evidence of a large infarct. Considered LHC, but this was deferred due to poor renal function. She was treated with 48 hours of IV heparin. Escalation of GDMT was limited by co morbidities, and she ultimately left AMA on 6/16.    Patient returned to [facility] 06/11/23, BNP remained elevated. LE edema and significant fatigue. On the morning on 06/13/23 patient developed torsades de pointes that lasted 13.5 seconds followed by ventricular bigeminy. During the episode, patient reported dizziness and blacking out. She underwent LHC on 06/15/23 that showed mildly elevated filling pressures, mild pulmonary hypertension, and normal cardiac output with no hemodynamic evidence of significant mitral regurgitation. Left heart catheterization showed severe 3V CAD, and the culprit was a subtotal occlusion of the RCA which is severely calcified in the mid-segment as well as complex bifurcation disease at the PDA/posterior AV groove. She was started on IV heparin. After her cath, she continued to have frequent PVCs and  NSVT on telemetry. She was started on amiodarone infusion. Decision was made to transfer patient to [facility] for CABG evaluation.    Not a candidate for CABG when evaluated. Dr. Okey Dupre discussed options with family including multivessel PCI that may require lithotripsy/arterectomy vs palliative medical therapy. Family decided to pursue PCI. She was seen by Nephrology on 6/25 and underwent HD that evening. Received 1 unit of PRBCs during HD, and hemoglobin improved from 7.6 to 9.2. Overnight, patient was hemodynamically stable, but did have a lot of confusion and agitation. She was treated with benzodiazepines with poor respons    On 6/26, patient underwent successful complex intravascular lithotripsy and had 2 overlapping drug-eluting stents placed to the mid-proximal right coronary artery. The patient did go into polymorphic ventricular tachycardia that lasted for about 10 seconds before starting the procedure. She converted spontaneously and did not require defibrillation. Later on 6/26, patient was transitioned from IV amiodarone to PO amiodarone and she remained on metoprolol succinate. She underwent HD on 6/27.    She was taken to the OR on 7/1 for tunneled dialysis catheter placement in the right internal jugular vein and left arm first stage brachiobasilic fistula creation. Patient ws discharged home per PT/OT recommendations.  Repeat echo 08/20/2023 showed normalization of LV systolic function with EF 65%, grade 2 DD, mild aortic stenosis with mean gradient 16 mmHg.  Patient will need day of surgery labs and evaluation.  Addendum: Spoke with Kea at [facility]. Due to recent DES and need for DAPT, pt will be postponed until cleared by cardiology.   EKG 06/18/2023: NSR.  Rate 68.  Cannot rule out inferior infarct, age undetermined.  TTE 08/20/2023:  1. Fixed left coronary cusp with mild AS.   2.  Left ventricular ejection fraction, by estimation, is 60 to 65%. The  left ventricle has normal  function. The left ventricle has no regional  wall motion abnormalities. There is mild left ventricular hypertrophy.  Left ventricular diastolic parameters  are consistent with Grade II diastolic dysfunction (pseudonormalization).   3. Right ventricular systolic function is normal. The right ventricular  size is normal.   4. The mitral valve is normal in structure. Trivial mitral valve  regurgitation. No evidence of mitral stenosis.   5. The aortic valve is calcified. Aortic valve regurgitation is trivial.  Mild aortic valve stenosis.   6. The inferior vena cava is normal in size with greater than 50%  respiratory variability, suggesting right atrial pressure of 3 mmHg.   PCI 06/17/2023:   Mid LAD lesion is 80% stenosed.   Mid Cx to Dist Cx lesion is 90% stenosed.   Ramus lesion is 60% stenosed.   RPDA lesion is 90% stenosed.   Prox RCA lesion is 80% stenosed.   Prox RCA to Mid RCA lesion is 99% stenosed.   Mid RCA lesion is 70% stenosed.   RPAV lesion is 60% stenosed.   A drug-eluting stent was successfully placed using a SYNERGY XD 3.0X48.   A drug-eluting stent was successfully placed using a SYNERGY XD 3.50X16.   Post intervention, there is a 0% residual stenosis.   Post intervention, there is a 0% residual stenosis.   Post intervention, there is a 0% residual stenosis.   Moderately to severely elevated left ventricular end-diastolic pressure at 32 mmHg. 2. Successful complex intravascular lithotripsy and 2 overlapped drug-eluting stent placement to the mid and proximal right coronary artery.  Very difficult procedure overall due to heavy calcifications and difficulty crossing the stenosis in the mid right coronary artery.  The distal RCA bifurcation disease appeared better than before likely due to improved upstream flow and thus I elected not to treat. 3.  The patient did go into polymorphic ventricular tachycardia that lasted for about 10 seconds before starting the procedure.   She converted and did not require defibrillation. 4.  Difficulty sedating the patient with continuous involuntary movements of her lower extremities throughout the case.   Recommendations: Dual antiplatelet therapy for at least 12 months. I discontinued heparin drip. I switch amiodarone drip to oral. Recommend treating the LAD disease medically.    Zannie Cove [facility]/Anesthesiology Phone [phone] 08/31/2023 10:03 AM'

## 2023-09-01 ENCOUNTER — Ambulatory Visit (HOSPITAL_COMMUNITY): Admission: RE | Admit: 2023-09-01 | Payer: Medicare Other | Source: Home / Self Care | Admitting: Vascular Surgery

## 2023-09-01 ENCOUNTER — Encounter (HOSPITAL_COMMUNITY): Admission: RE | Payer: Self-pay | Source: Home / Self Care

## 2023-09-01 SURGERY — TRANSPOSITION, VEIN, BASILIC
Anesthesia: Monitor Anesthesia Care | Laterality: Left

## 2023-09-02 DIAGNOSIS — N186 End stage renal disease: Secondary | ICD-10-CM | POA: Diagnosis not present

## 2023-09-02 DIAGNOSIS — N2581 Secondary hyperparathyroidism of renal origin: Secondary | ICD-10-CM | POA: Diagnosis not present

## 2023-09-02 DIAGNOSIS — Z992 Dependence on renal dialysis: Secondary | ICD-10-CM | POA: Diagnosis not present

## 2023-09-04 DIAGNOSIS — N2581 Secondary hyperparathyroidism of renal origin: Secondary | ICD-10-CM | POA: Diagnosis not present

## 2023-09-04 DIAGNOSIS — Z992 Dependence on renal dialysis: Secondary | ICD-10-CM | POA: Diagnosis not present

## 2023-09-04 DIAGNOSIS — N186 End stage renal disease: Secondary | ICD-10-CM | POA: Diagnosis not present

## 2023-09-07 ENCOUNTER — Encounter (HOSPITAL_COMMUNITY): Payer: Self-pay

## 2023-09-07 ENCOUNTER — Emergency Department (HOSPITAL_COMMUNITY): Payer: Medicare Other

## 2023-09-07 ENCOUNTER — Inpatient Hospital Stay (HOSPITAL_COMMUNITY): Payer: Medicare Other

## 2023-09-07 ENCOUNTER — Observation Stay (HOSPITAL_COMMUNITY)
Admission: EM | Admit: 2023-09-07 | Discharge: 2023-09-08 | Disposition: A | Discharge status: Home / Self Care | Payer: Medicare Other | Attending: Family Medicine | Admitting: Family Medicine

## 2023-09-07 ENCOUNTER — Other Ambulatory Visit: Payer: Self-pay

## 2023-09-07 DIAGNOSIS — J45909 Unspecified asthma, uncomplicated: Secondary | ICD-10-CM | POA: Diagnosis not present

## 2023-09-07 DIAGNOSIS — N186 End stage renal disease: Secondary | ICD-10-CM | POA: Insufficient documentation

## 2023-09-07 DIAGNOSIS — E1122 Type 2 diabetes mellitus with diabetic chronic kidney disease: Secondary | ICD-10-CM | POA: Diagnosis not present

## 2023-09-07 DIAGNOSIS — W100XXS Fall (on)(from) escalator, sequela: Secondary | ICD-10-CM

## 2023-09-07 DIAGNOSIS — Z992 Dependence on renal dialysis: Secondary | ICD-10-CM | POA: Diagnosis not present

## 2023-09-07 DIAGNOSIS — Z91010 Allergy to peanuts: Secondary | ICD-10-CM | POA: Insufficient documentation

## 2023-09-07 DIAGNOSIS — J90 Pleural effusion, not elsewhere classified: Secondary | ICD-10-CM | POA: Diagnosis not present

## 2023-09-07 DIAGNOSIS — S199XXA Unspecified injury of neck, initial encounter: Secondary | ICD-10-CM | POA: Diagnosis not present

## 2023-09-07 DIAGNOSIS — Z79899 Other long term (current) drug therapy: Secondary | ICD-10-CM | POA: Diagnosis not present

## 2023-09-07 DIAGNOSIS — I132 Hypertensive heart and chronic kidney disease with heart failure and with stage 5 chronic kidney disease, or end stage renal disease: Secondary | ICD-10-CM | POA: Insufficient documentation

## 2023-09-07 DIAGNOSIS — I2510 Atherosclerotic heart disease of native coronary artery without angina pectoris: Secondary | ICD-10-CM | POA: Insufficient documentation

## 2023-09-07 DIAGNOSIS — Z955 Presence of coronary angioplasty implant and graft: Secondary | ICD-10-CM | POA: Diagnosis not present

## 2023-09-07 DIAGNOSIS — Z23 Encounter for immunization: Secondary | ICD-10-CM | POA: Diagnosis not present

## 2023-09-07 DIAGNOSIS — S3993XA Unspecified injury of pelvis, initial encounter: Secondary | ICD-10-CM | POA: Diagnosis not present

## 2023-09-07 DIAGNOSIS — S0990XA Unspecified injury of head, initial encounter: Secondary | ICD-10-CM | POA: Diagnosis not present

## 2023-09-07 DIAGNOSIS — Z7984 Long term (current) use of oral hypoglycemic drugs: Secondary | ICD-10-CM | POA: Diagnosis not present

## 2023-09-07 DIAGNOSIS — Z7982 Long term (current) use of aspirin: Secondary | ICD-10-CM | POA: Diagnosis not present

## 2023-09-07 DIAGNOSIS — Z85828 Personal history of other malignant neoplasm of skin: Secondary | ICD-10-CM | POA: Diagnosis not present

## 2023-09-07 DIAGNOSIS — I5041 Acute combined systolic (congestive) and diastolic (congestive) heart failure: Secondary | ICD-10-CM | POA: Insufficient documentation

## 2023-09-07 DIAGNOSIS — E039 Hypothyroidism, unspecified: Secondary | ICD-10-CM | POA: Insufficient documentation

## 2023-09-07 DIAGNOSIS — R55 Syncope and collapse: Secondary | ICD-10-CM | POA: Diagnosis not present

## 2023-09-07 DIAGNOSIS — S0101XA Laceration without foreign body of scalp, initial encounter: Secondary | ICD-10-CM | POA: Diagnosis not present

## 2023-09-07 DIAGNOSIS — I959 Hypotension, unspecified: Secondary | ICD-10-CM | POA: Diagnosis not present

## 2023-09-07 DIAGNOSIS — R58 Hemorrhage, not elsewhere classified: Secondary | ICD-10-CM | POA: Diagnosis not present

## 2023-09-07 DIAGNOSIS — S299XXA Unspecified injury of thorax, initial encounter: Secondary | ICD-10-CM | POA: Diagnosis not present

## 2023-09-07 DIAGNOSIS — W19XXXA Unspecified fall, initial encounter: Secondary | ICD-10-CM | POA: Diagnosis not present

## 2023-09-07 DIAGNOSIS — I517 Cardiomegaly: Secondary | ICD-10-CM | POA: Diagnosis not present

## 2023-09-07 LAB — PROTIME-INR
INR: 1.1 (ref 0.8–1.2)
Prothrombin Time: 14.3 s (ref 11.4–15.2)

## 2023-09-07 LAB — COMPREHENSIVE METABOLIC PANEL
ALT: 15 U/L (ref 0–44)
AST: 16 U/L (ref 15–41)
Albumin: 3.4 g/dL — ABNORMAL LOW (ref 3.5–5.0)
Alkaline Phosphatase: 115 U/L (ref 38–126)
Anion gap: 16 — ABNORMAL HIGH (ref 5–15)
BUN: 57 mg/dL — ABNORMAL HIGH (ref 8–23)
CO2: 23 mmol/L (ref 22–32)
Calcium: 10.2 mg/dL (ref 8.9–10.3)
Chloride: 102 mmol/L (ref 98–111)
Creatinine, Ser: 4.7 mg/dL — ABNORMAL HIGH (ref 0.44–1.00)
GFR, Estimated: 9 mL/min — ABNORMAL LOW (ref >60)
Glucose, Bld: 144 mg/dL — ABNORMAL HIGH (ref 70–99)
Potassium: 3.5 mmol/L (ref 3.5–5.1)
Sodium: 141 mmol/L (ref 135–145)
Total Bilirubin: 0.3 mg/dL (ref 0.3–1.2)
Total Protein: 6.3 g/dL — ABNORMAL LOW (ref 6.5–8.1)

## 2023-09-07 LAB — CBC WITH DIFFERENTIAL/PLATELET
Abs Immature Granulocytes: 0.03 10*3/uL (ref 0.00–0.07)
Basophils Absolute: 0 10*3/uL (ref 0.0–0.1)
Basophils Relative: 0 %
Eosinophils Absolute: 0.1 10*3/uL (ref 0.0–0.5)
Eosinophils Relative: 1 %
HCT: 34.5 % — ABNORMAL LOW (ref 36.0–46.0)
Hemoglobin: 10.7 g/dL — ABNORMAL LOW (ref 12.0–15.0)
Immature Granulocytes: 0 %
Lymphocytes Relative: 13 %
Lymphs Abs: 0.9 10*3/uL (ref 0.7–4.0)
MCH: 30.3 pg (ref 26.0–34.0)
MCHC: 31 g/dL (ref 30.0–36.0)
MCV: 97.7 fL (ref 80.0–100.0)
Monocytes Absolute: 0.5 10*3/uL (ref 0.1–1.0)
Monocytes Relative: 8 %
Neutro Abs: 5.4 10*3/uL (ref 1.7–7.7)
Neutrophils Relative %: 78 %
Platelets: 105 10*3/uL — ABNORMAL LOW (ref 150–400)
RBC: 3.53 MIL/uL — ABNORMAL LOW (ref 3.87–5.11)
RDW: 13.9 % (ref 11.5–15.5)
WBC: 7 10*3/uL (ref 4.0–10.5)
nRBC: 0 % (ref 0.0–0.2)

## 2023-09-07 LAB — TROPONIN I (HIGH SENSITIVITY)
Troponin I (High Sensitivity): 53 ng/L — ABNORMAL HIGH (ref <18)
Troponin I (High Sensitivity): 48 ng/L — ABNORMAL HIGH (ref <18)

## 2023-09-07 MED ORDER — SODIUM BICARBONATE 650 MG PO TABS
650.0000 mg | ORAL_TABLET | Freq: Two times a day (BID) | ORAL | Status: DC
  Administered 2023-09-07 – 2023-09-08 (×4): 650 mg via ORAL
  Filled 2023-09-07 (×4): qty 1

## 2023-09-07 MED ORDER — AMIODARONE HCL 200 MG PO TABS
200.0000 mg | ORAL_TABLET | Freq: Every day | ORAL | Status: DC
  Administered 2023-09-07 – 2023-09-08 (×2): 200 mg via ORAL
  Filled 2023-09-07 (×2): qty 1

## 2023-09-07 MED ORDER — LEVOTHYROXINE SODIUM 75 MCG PO TABS
75.0000 ug | ORAL_TABLET | Freq: Every day | ORAL | Status: DC
  Administered 2023-09-07 – 2023-09-08 (×2): 75 ug via ORAL
  Filled 2023-09-07 (×2): qty 1

## 2023-09-07 MED ORDER — ALLOPURINOL 100 MG PO TABS: 100.0000 mg | ORAL_TABLET | Freq: Every day | ORAL | Status: DC | PRN

## 2023-09-07 MED ORDER — FERRIC CITRATE 1 GM 210 MG(FE) PO TABS
210.0000 mg | ORAL_TABLET | Freq: Three times a day (TID) | ORAL | Status: DC
  Administered 2023-09-08 (×2): 210 mg via ORAL
  Filled 2023-09-07 (×5): qty 1

## 2023-09-07 MED ORDER — INFLUENZA VAC A&B SURF ANT ADJ 0.5 ML IM SUSY
0.5000 mL | PREFILLED_SYRINGE | INTRAMUSCULAR | Status: AC
  Administered 2023-09-08: 0.5 mL via INTRAMUSCULAR
  Filled 2023-09-07 (×2): qty 0.5

## 2023-09-07 MED ORDER — ASPIRIN 81 MG PO TBEC
81.0000 mg | DELAYED_RELEASE_TABLET | Freq: Every day | ORAL | Status: DC
  Administered 2023-09-07 – 2023-09-08 (×2): 81 mg via ORAL
  Filled 2023-09-07 (×2): qty 1

## 2023-09-07 MED ORDER — PNEUMOCOCCAL 20-VAL CONJ VACC 0.5 ML IM SUSY
0.5000 mL | PREFILLED_SYRINGE | INTRAMUSCULAR | Status: AC
  Administered 2023-09-08: 0.5 mL via INTRAMUSCULAR
  Filled 2023-09-07 (×2): qty 0.5

## 2023-09-07 MED ORDER — CHLORHEXIDINE GLUCONATE CLOTH 2 % EX PADS
6.0000 | MEDICATED_PAD | Freq: Every day | CUTANEOUS | Status: DC
  Administered 2023-09-08: 6 via TOPICAL

## 2023-09-07 MED ORDER — ACETAMINOPHEN 500 MG PO TABS: 500.0000 mg | ORAL_TABLET | Freq: Four times a day (QID) | ORAL | Status: DC | PRN

## 2023-09-07 MED ORDER — ALLOPURINOL 100 MG PO TABS
100.0000 mg | ORAL_TABLET | Freq: Every day | ORAL | Status: DC
  Administered 2023-09-08: 100 mg via ORAL
  Filled 2023-09-07 (×2): qty 1

## 2023-09-07 MED ORDER — LIDOCAINE-EPINEPHRINE (PF) 2 %-1:200000 IJ SOLN
20.0000 mL | Freq: Once | INTRAMUSCULAR | Status: AC
  Administered 2023-09-07: 20 mL
  Filled 2023-09-07: qty 20

## 2023-09-07 MED ORDER — LORATADINE 10 MG PO TABS: 5.0000 mg | ORAL_TABLET | Freq: Every day | ORAL | Status: DC | PRN

## 2023-09-07 MED ORDER — CLOPIDOGREL BISULFATE 75 MG PO TABS
75.0000 mg | ORAL_TABLET | Freq: Every day | ORAL | Status: DC
  Administered 2023-09-07 – 2023-09-08 (×2): 75 mg via ORAL
  Filled 2023-09-07 (×2): qty 1

## 2023-09-07 NOTE — H&P (Signed)
History and Physical    Cassandra Baker GLO:[id number] DOB: 08-09-1941 DOA: 09/07/2023  PCP: Renford Dills, MD  Patient coming from: home  I have personally briefly reviewed patient's old medical records in [facility]  Chief Complaint: syncope  HPI: Cassandra Baker is a 82 y.o. 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  Patient of note initially presented to hospital in June 2 separate occasions diagnosed with progressive ESRD, NSTEMI, ischemic CMY and VT. Patient now returns with episode of syncope with head strike with concern for possible cardiac cause.Per patient she noted she has episodes of blackout from time to time and has mentioned that they have occurred while driving. She notes has never had a fall until this episode.  She does no recall much from episode just know she passed out and hit her head. She is not able to describe prodrome, notes no chest pain , sob , n/v/ or bowel or bladder incontinence.   ED Course:  Vitals: afeb, bp 160.78, hr 86, rr 25, sat 98%  Wbc7, hgb 10.7, plt 105,inr 1.1 Na 141, K 3.5, CL102, Glu 144, Cr 4.7, AG 16 JO84,16 Cxr: IMPRESSION: Cardiomegaly and small pleural effusions, chronic when compared to priors.  Xray pelvis: NAD  SAY:[id number] and small pleural effusions, chronic when compared to priors.  CTH/cervical spine: MPRESSION: No evidence of acute intracranial or cervical spine injury.    EKG: SNR, PVC   Review of Systems: As per HPI otherwise 10 point review of systems negative.   Past Medical History:  Diagnosis Date   Anemia    Arthritis    low back,knees   Asthma    Basal cell carcinoma (BCC)    a. nose   CHF (congestive heart failure) (HCC)    a. prev eval by Dr. Katrinka Blazing in [place] - "many yrs ago."   CKD  (chronic kidney disease), stage IV (HCC)    Depression    Diabetes mellitus without complication (HCC)    Dyspnea    Hypertension    Hypothyroidism     Past Surgical History:  Procedure Laterality Date   APPENDECTOMY     4 th grade   AV FISTULA PLACEMENT Left 05/12/2019   Procedure: BRACHIOCEPHALIC ARTERIOVENOUS (AV) FISTULA CREATION LEFT ARM;  Surgeon: Nada Libman, MD;  Location: [facility] OR;  Service: Vascular;  Laterality: Left;   AV FISTULA PLACEMENT Left 06/22/2023   Procedure: LEFT BRACHIOBASILIC ARTERIOVENOUS (AV) FISTULA CREATION;  Surgeon: Victorino Sparrow, MD;  Location: [facility] OR;  Service: Vascular;  Laterality: Left;   CORONARY STENT INTERVENTION N/A 06/17/2023   Procedure: CORONARY STENT INTERVENTION;  Surgeon: Iran Ouch, MD;  Location: [facility] INVASIVE CV LAB;  Service: Cardiovascular;  Laterality: N/A;   EYE SURGERY  2014   Bilateral 6 mos. apart  cataracts   FISTULA SUPERFICIALIZATION Left 10/06/2019   Procedure: ELEVATION OF ARTERIOVENOUS FISTULA LEFT ARM;  Surgeon: Nada Libman, MD;  Location: [facility] OR;  Service: Vascular;  Laterality: Left;   INSERTION OF DIALYSIS CATHETER N/A 06/22/2023   Procedure: INSERTION OF TUNNELED  DIALYSIS CATHETER USING 19CM PALINDROME CATHETER;  Surgeon: Victorino Sparrow, MD;  Location: [facility] OR;  Service: Vascular;  Laterality: N/A;   LYMPH NODE BIOPSY N/A 02/09/2018   Procedure: SENTINEL LYMPH NODE BIOPSY;  Surgeon: Adolphus Birchwood, MD;  Location: [facility] ORS;  Service: Gynecology;  Laterality: N/A;   PARATHYROIDECTOMY N/A 12/07/2014   Procedure: PARATHYROIDECTOMY;  Surgeon: Darnell Level, MD;  Location: [facility] ORS;  Service: General;  Laterality: N/A;   PERIPHERAL INTRAVASCULAR LITHOTRIPSY  06/17/2023   Procedure: PERIPHERAL INTRAVASCULAR LITHOTRIPSY;  Surgeon: Iran Ouch, MD;  Location: [facility] INVAS[facility] CV LAB;  Service: Cardiovascular;;   RIGHT/LEFT HEART CATH AND CORONARY ANGIOGRAPHY N/A 06/15/2023   Procedure: RIGHT/LEFT HEART CATH AND CORONARY ANGIOGRAPHY;   Surgeon: Iran Ouch, MD;  Location: [facility] INVASIVE CV LAB;  Service: Cardiovascular;  Laterality: N/A;   ROBOTIC ASSISTED TOTAL HYSTERECTOMY WITH BILATERAL SALPINGO OOPHERECTOMY Bilateral 02/09/2018   Procedure: XI ROBOTIC ASSISTED TOTAL HYSTERECTOMY WITH BILATERAL SALPINGO OOPHORECTOMY;  Surgeon: Adolphus Birchwood, MD;  Location: [facility] ORS;  Service: Gynecology;  Laterality: Bilateral;   TEMPORARY DIALYSIS CATHETER N/A 06/12/2023   Procedure: TEMPORARY DIALYSIS CATHETER;  Surgeon: Renford Dills, MD;  Location: [facility] INVASIVE CV LAB;  Service: Cardiovascular;  Laterality: N/A;   THYROID LOBECTOMY Left 12/07/2014   Procedure: LEFT THYROID LOBECTOMY;  Surgeon: Darnell Level, MD;  Location: [facility] ORS;  Service: General;  Laterality: Left;   TONSILLECTOMY     TUBAL LIGATION     Early thirties     reports that she has never smoked. She has never used smokeless tobacco. She reports that she does not drink alcohol and does not use drugs.  Allergies  Allergen Reactions   Plum Pulp Anaphylaxis and Cough   Almond (Diagnostic) Itching   Lemon Oil Other (See Comments)    Lemons cause coughing and respiratory issues    Other Itching    Dairy products/Soaps & detergents   Peanut-Containing Drug Products Itching   Penicillins Rash    Tolerates ceftriaxone    Family History  Problem Relation Age of Onset   Hypertension Mother        MI's beginning in 28's, died @ 46   Heart disease Mother    Hypertension Father    Heart disease Father    Heart disease Brother    Stroke Brother    Cervical cancer Maternal Aunt    Heart disease Brother    Stroke Brother     Prior to Admission medications   Medication Sig Start Date End Date Taking? Authorizing Provider  acetaminophen (TYLENOL) 500 MG tablet Take 500-1,000 mg by mouth every 6 (six) hours as needed (pain).    Yes [provider]  allopurinol (ZYLOPRIM) 100 MG tablet Take 100 mg by mouth daily as needed (For gout).   Yes [provider]  amiodarone (PACERONE) 200 MG tablet Take 1 tablet (200 mg total) by mouth daily. 06/27/23  Yes Azalee Course, PA  aspirin EC 81 MG tablet Take 1 tablet (81 mg total) by mouth daily. Swallow whole. Patient taking differently: Take 81 mg by mouth daily. 06/16/23  Yes Lurene Shadow, MD  clopidogrel (PLAVIX) 75 MG tablet Take 1 tablet (75 mg total) by mouth daily. 06/27/23  Yes Azalee Course, PA  ferric citrate (AURYXIA) 1 GM 210 MG(Fe) tablet Take 210 mg by mouth 3 (three) times daily with meals.   Yes [provider]  furosemide (LASIX) 80 MG tablet Take 80 mg by mouth 2 (two) times daily.   Yes [provider]  levothyroxine (SYNTHROID) 75 MCG tablet Take 75 mcg by mouth daily before breakfast.   Yes [provider]  loratadine (CLARITIN) 5 MG chewable tablet Chew 5 mg by mouth daily as needed for allergies.  Yes [provider]  nitroGLYCERIN (NITROSTAT) 0.4 MG SL tablet Place 1 tablet (0.4 mg total) under the tongue every 5 (five) minutes x 3 doses as needed for chest pain. 06/27/23  Yes Azalee Course, PA  sitaGLIPtin (JANUVIA) 25 MG tablet Take 25 mg by mouth daily.   Yes [provider]  sodium bicarbonate 650 MG tablet Take 650 mg by mouth 2 (two) times daily. 04/26/19  Yes [provider]  FLUoxetine (PROZAC) 20 MG tablet Take 20 mg by mouth at bedtime.    [provider]    Physical Exam: Vitals:   09/07/23 1130 09/07/23 1300 09/07/23 1415 09/07/23 1417  BP: (!) 163/66   (!) 142/50  Pulse: 80 76  76  Resp: 19 18  (!) 30  Temp:   98.1 F (36.7 C) 98.6 F (37 C)  TempSrc:   Oral   SpO2: 97% 95%  97%  Weight:      Height:        Constitutional: NAD, calm, comfortable Vitals:   09/07/23 1130 09/07/23 1300 09/07/23 1415 09/07/23 1417  BP: (!) 163/66   (!) 142/50  Pulse: 80 76  76  Resp: 19 18  (!) 30  Temp:   98.1 F (36.7 C) 98.6 F (37 C)  TempSrc:   Oral   SpO2: 97% 95%  97%  Weight:      Height:       Eyes:  PERRL, lids and conjunctivae normal ENMT: Mucous membranes are moist. Posterior pharynx clear of any exudate or lesions.Normal dentition.  Neck: normal, supple, no masses, no thyromegaly Respiratory: clear to auscultation bilaterally, no wheezing, no crackles. Normal respiratory effort. No accessory muscle use.  Cardiovascular: Regular rate and rhythm, no murmurs / rubs / gallops. No extremity edema. 2+ pedal pulses. No carotid bruits.  Abdomen: no tenderness, no masses palpated. No hepatosplenomegaly. Bowel sounds positive.  Musculoskeletal: no clubbing / cyanosis. No joint deformity upper and lower extremities. Good ROM, no contractures. Normal muscle tone.  Skin: no rashes, lesions, ulcers. No induration Neurologic: CN 2-12 grossly intact. Sensation intact, DTR normal. Strength 5/5 in all 4.  Psychiatric: Normal judgment and insight. Alert and oriented x 3. Normal mood.    Labs on Admission: I have personally reviewed following labs and imaging studies  CBC: Recent Labs  Lab 09/07/23 0505  WBC 7.0  NEUTROABS 5.4  HGB 10.7*  HCT 34.5*  MCV 97.7  PLT 105*   Basic Metabolic Panel: Recent Labs  Lab 09/07/23 0505  NA 141  K 3.5  CL 102  CO2 23  GLUCOSE 144*  BUN 57*  CREATININE 4.70*  CALCIUM 10.2   GFR: Estimated Creatinine Clearance: 8.6 mL/min (A) (by C-G formula based on SCr of 4.7 mg/dL (H)). Liver Function Tests: Recent Labs  Lab 09/07/23 0505  AST 16  ALT 15  ALKPHOS 115  BILITOT 0.3  PROT 6.3*  ALBUMIN 3.4*   No results for input(s): "LIPASE", "AMYLASE" in the last 168 hours. No results for input(s): "AMMONIA" in the last 168 hours. Coagulation Profile: Recent Labs  Lab 09/07/23 0505  INR 1.1   Cardiac Enzymes: No results for input(s): "CKTOTAL", "CKMB", "CKMBINDEX", "TROPONINI" in the last 168 hours. BNP (last 3 results) No results for input(s): "PROBNP" in the last 8760 hours. HbA1C: No results for input(s): "HGBA1C" in the last 72  hours. CBG: No results for input(s): "GLUCAP" in the last 168 hours. Lipid Profile: No results for input(s): "CHOL", "HDL", "LDLCALC", "TRIG", "CHOLHDL", "  LDLDIRECT" in the last 72 hours. Thyroid Function Tests: No results for input(s): "TSH", "T4TOTAL", "FREET4", "T3FREE", "THYROIDAB" in the last 72 hours. Anemia Panel: No results for input(s): "VITAMINB12", "FOLATE", "FERRITIN", "TIBC", "IRON", "RETICCTPCT" in the last 72 hours. Urine analysis:    Component Value Date/Time   COLORURINE STRAW (A) 06/11/2023 1322   APPEARANCEUR HAZY (A) 06/11/2023 1322   APPEARANCEUR Clear 03/03/2014 1045   LABSPEC 1.006 06/11/2023 1322   LABSPEC 1.005 03/03/2014 1045   PHURINE 7.0 06/11/2023 1322   GLUCOSEU 50 (A) 06/11/2023 1322   GLUCOSEU 50 mg/dL 16/09/9603 5409   HGBUR NEGATIVE 06/11/2023 1322   BILIRUBINUR NEGATIVE 06/11/2023 1322   BILIRUBINUR Negative 03/03/2014 1045   KETONESUR NEGATIVE 06/11/2023 1322   PROTEINUR 100 (A) 06/11/2023 1322   NITRITE NEGATIVE 06/11/2023 1322   LEUKOCYTESUR LARGE (A) 06/11/2023 1322   LEUKOCYTESUR 3+ 03/03/2014 1045    Radiological Exams on Admission: CT Head Wo Contrast  Result Date: 09/07/2023 CLINICAL DATA:  Fall with neck trauma and head injury. EXAM: CT HEAD WITHOUT CONTRAST CT CERVICAL SPINE WITHOUT CONTRAST TECHNIQUE: Multidetector CT imaging of the head and cervical spine was performed following the standard protocol without intravenous contrast. Multiplanar CT image reconstructions of the cervical spine were also generated. RADIATION DOSE REDUCTION: This exam was performed according to the departmental dose-optimization program which includes automated exposure control, adjustment of the mA and/or kV according to patient size and/or use of iterative reconstruction technique. COMPARISON:  None Available. FINDINGS: CT HEAD FINDINGS Brain: No evidence of acute infarction, hemorrhage, hydrocephalus, extra-axial collection or mass lesion/mass effect.  Vascular: No hyperdense vessel or unexpected calcification. Skull: No acute fracture.  Hyperostosis interna. Sinuses/Orbits: No visible injury CT CERVICAL SPINE FINDINGS Alignment: Normal. Skull base and vertebrae: No acute fracture Soft tissues and spinal canal: No prevertebral fluid or swelling. No visible canal hematoma. Left hemithyroidectomy. 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 Subjective osteopenia. Upper chest: No visible injury IMPRESSION: No evidence of acute intracranial or cervical spine injury. Electronically Signed   By: Tiburcio Pea M.D.   On: 09/07/2023 05:37   CT Cervical Spine Wo Contrast  Result Date: 09/07/2023 CLINICAL DATA:  Fall with neck trauma and head injury. EXAM: CT HEAD WITHOUT CONTRAST CT CERVICAL SPINE WITHOUT CONTRAST TECHNIQUE: Multidetector CT imaging of the head and cervical spine was performed following the standard protocol without intravenous contrast. Multiplanar CT image reconstructions of the cervical spine were also generated. RADIATION DOSE REDUCTION: This exam was performed according to the departmental dose-optimization program which includes automated exposure control, adjustment of the mA and/or kV according to patient size and/or use of iterative reconstruction technique. COMPARISON:  None Available. FINDINGS: CT HEAD FINDINGS Brain: No evidence of acute infarction, hemorrhage, hydrocephalus, extra-axial collection or mass lesion/mass effect. Vascular: No hyperdense vessel or unexpected calcification. Skull: No acute fracture.  Hyperostosis interna. Sinuses/Orbits: No visible injury CT CERVICAL SPINE FINDINGS Alignment: Normal. Skull base and vertebrae: No acute fracture Soft tissues and spinal canal: No prevertebral fluid or swelling. No visible canal hematoma. Left hemithyroidectomy. 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 Subjective osteopenia. Upper chest: No visible injury IMPRESSION: No  evidence of acute intracranial or cervical spine injury. Electronically Signed   By: Tiburcio Pea M.D.   On: 09/07/2023 05:37   DG Chest Portable 1 View  Result Date: 09/07/2023 CLINICAL DATA:  Level 2 trauma due to fall EXAM: PORTABLE CHEST 1 VIEW COMPARISON:  06/22/2023 FINDINGS: Cardiomegaly and chronic small pleural  effusions. No pulmonary edema or consolidation. No pneumothorax. Dialysis catheter with tip at the upper cavoatrial junction. Extensive artifact from EKG leads. IMPRESSION: Cardiomegaly and small pleural effusions, chronic when compared to priors. Electronically Signed   By: Tiburcio Pea M.D.   On: 09/07/2023 05:24   DG Pelvis Portable  Result Date: 09/07/2023 CLINICAL DATA:  Level 2 trauma due to fall. EXAM: PORTABLE PELVIS 1 VIEWS COMPARISON:  None Available. FINDINGS: Limited by underpenetration. No evidence of pelvic ring or hip fracture. Lower lumbar spine degeneration. IMPRESSION: No acute finding. Electronically Signed   By: Tiburcio Pea M.D.   On: 09/07/2023 05:23    EKG: Independently reviewed. See above  Assessment/Plan Syncope with fall -patient unable to give full details re-event -she is s/p fall with posterior scalp lac s/p suture  -concern for possible cardiac etiology of syncope with hx of Polymorphic VT -continue on amiodarone -last echo : 8/29  noted recovery of EF 65%  -admit for cardiac monitoring overnight  -limited echo  pending  -consider cardiology consult based on results of above - check orthostatics - patient CTH NAD and patient neurologically intact  - if further recurrence and concern for neurologic cause consider MRI -consider out patient event monitoring  CHFref with recovered EF - no acute exacerbation  -lasix 80 daily  -on ESRD for volume control  Polymorphic VT -continue on amiodarone  -in setting of syncope may need event monitoring  on discharge if uneventful overnight.   CAD s/p NSTEMI s/p stent  -continue on  plavix,asa  ESRD on HD MWF -nephrology on board  -case discussed with Dr Thedore Mins  ACD -at baseline   Hypothrodism  -resume synthroid   DMII -iss/fs  -resume Januvia       DVT prophylaxis: heparin Code Status: full/ as discussed per patient wishes in event of cardiac arrest   Family Communication: none at bedside Disposition Plan: patient  expected to be admitted less than 2 midnights  Consults called: Cardiology  Admission status: cardiac tele   Lurline Del MD [facility]   If 7PM-7AM, please contact night-coverage [email or web address] Password [facility]  09/07/2023, 4:06 PM

## 2023-09-07 NOTE — Progress Notes (Signed)
Attempted limited echo with bubble study, but patient does not have IV access. Reached out to RN who states patient refused IV placement. Unable to perform bubble study at this time.

## 2023-09-07 NOTE — ED Notes (Signed)
Pt steady with help to bathroom.

## 2023-09-07 NOTE — ED Provider Notes (Signed)
[place] EMERGENCY DEPARTMENT AT [name] Provider Note   CSN: [id number] Arrival date & time: 09/07/23  0453     History  Chief Complaint  Patient presents with   Cassandra Baker is a 82 y.o. female.  Patient arrives as a level 2 trauma.  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  She reports she is staying at a hotel.  She apparently got out of bed to try to go to the bathroom and woke up with a sensation of herself falling over a piece of furniture.  She struck the back of her head and sustained a laceration.  She believes she was "sleepwalking".  Does not recall getting out of bed or trying to go to the bathroom.  No preceding dizziness or lightheadedness.  No chest pain or shortness of breath.  No abdominal pain, nausea or vomiting.  She is due for dialysis today.  She has never had an episode of sleepwalking before. No history of seizures.  The history is provided by the patient and the EMS personnel. The history is limited by the condition of the patient.  Fall Associated symptoms include headaches. Pertinent negatives include no chest pain, no abdominal pain and no shortness of breath.       Home Medications Prior to Admission medications   Medication Sig Start Date End Date Taking? Authorizing Provider  acetaminophen (TYLENOL) 500 MG tablet Take 500-1,000 mg by mouth every 6 (six) hours as needed (pain).     [provider]  allopurinol (ZYLOPRIM) 100 MG tablet Take 100 mg by mouth daily as needed (For gout).    [provider]  amiodarone (PACERONE) 200 MG tablet Take 1 tablet (200 mg total) by mouth daily. 06/27/23   Azalee Course, PA  aspirin EC 81 MG tablet Take 1 tablet (81 mg total) by mouth daily. Swallow whole. 06/16/23   Lurene Shadow, MD  clopidogrel (PLAVIX) 75 MG tablet Take 1 tablet (75 mg total) by mouth daily. 06/27/23   Azalee Course, PA  ferric  citrate (AURYXIA) 1 GM 210 MG(Fe) tablet Take 210 mg by mouth 3 (three) times daily with meals.    [provider]  FLUoxetine (PROZAC) 20 MG tablet Take 20 mg by mouth at bedtime.    [provider]  furosemide (LASIX) 80 MG tablet Take 80 mg by mouth 2 (two) times daily.    [provider]  levothyroxine (SYNTHROID) 75 MCG tablet Take 75 mcg by mouth daily before breakfast.    [provider]  loratadine (CLARITIN) 5 MG chewable tablet Chew 5 mg by mouth daily as needed for allergies.    [provider]  nitroGLYCERIN (NITROSTAT) 0.4 MG SL tablet Place 1 tablet (0.4 mg total) under the tongue every 5 (five) minutes x 3 doses as needed for chest pain. 06/27/23   Azalee Course, PA  sitaGLIPtin (JANUVIA) 25 MG tablet Take 25 mg by mouth daily.    [provider]  sodium bicarbonate 650 MG tablet Take 650 mg by mouth 2 (two) times daily. 04/26/19   [provider]      Allergies    Plum pulp, Almond (diagnostic), Lemon oil, Other, Peanut-containing drug products, and Penicillins    Review of Systems   Review of Systems  Constitutional:  Negative for activity change, appetite change and fever.  HENT:  Negative for congestion and rhinorrhea.   Respiratory:  Negative for chest tightness and shortness of breath.   Cardiovascular:  Negative for chest pain.  Gastrointestinal:  Negative for abdominal pain, nausea and vomiting.  Genitourinary:  Negative for dysuria and hematuria.  Musculoskeletal:  Negative for arthralgias and myalgias.  Skin:  Negative for pallor.  Neurological:  Positive for syncope and headaches.   all other systems are negative except as noted in the HPI and PMH.    Physical Exam Updated Vital Signs BP (!) 160/78   Pulse 86   Resp (!) 25   Ht 5\' 4"  (1.626 m)   Wt 68.7 kg   LMP  (LMP Unknown)   SpO2 98%   BMI 26.00 kg/m  Physical Exam Vitals and nursing note reviewed.  Constitutional:      General: She is not in  acute distress.    Appearance: She is well-developed. She is not ill-appearing.  HENT:     Head: Normocephalic.     Comments: 1 cm vertical laceration on the right occipital scalp    Mouth/Throat:     Pharynx: No oropharyngeal exudate.  Eyes:     Conjunctiva/sclera: Conjunctivae normal.     Pupils: Pupils are equal, round, and reactive to light.  Neck:     Comments: No C spine tenderness Cardiovascular:     Rate and Rhythm: Normal rate and regular rhythm.     Heart sounds: Normal heart sounds. No murmur heard. Pulmonary:     Effort: Pulmonary effort is normal. No respiratory distress.     Breath sounds: Normal breath sounds.     Comments: Dialysis catheter R chest Chest:     Chest wall: No tenderness.  Abdominal:     Palpations: Abdomen is soft.     Tenderness: There is no abdominal tenderness. There is no guarding or rebound.  Musculoskeletal:        General: No tenderness. Normal range of motion.     Cervical back: Normal range of motion and neck supple.     Comments: Pelvis stable.  No T or L-spine tenderness.  Full range of motion bilateral hips without pain  Skin:    General: Skin is warm.  Neurological:     General: No focal deficit present.     Mental Status: She is alert and oriented to person, place, and time. Mental status is at baseline.     Cranial Nerves: No cranial nerve deficit.     Motor: No abnormal muscle tone.     Coordination: Coordination normal.     Comments:  5/5 strength throughout. CN 2-12 intact.Equal grip strength.   Psychiatric:        Behavior: Behavior normal.     ED Results / Procedures / Treatments   Labs (all labs ordered are listed, but only abnormal results are displayed) Labs Reviewed  CBC WITH DIFFERENTIAL/PLATELET - Abnormal; Notable for the following components:      Result Value   RBC 3.53 (*)    Hemoglobin 10.7 (*)    HCT 34.5 (*)    Platelets 105 (*)    All other components within normal limits  COMPREHENSIVE METABOLIC  PANEL - Abnormal; Notable for the following components:   Glucose, Bld 144 (*)    BUN 57 (*)    Creatinine, Ser 4.70 (*)    Total Protein 6.3 (*)    Albumin 3.4 (*)    GFR, Estimated 9 (*)    Anion gap 16 (*)    All other components within normal limits  TROPONIN  I (HIGH SENSITIVITY) - Abnormal; Notable for the following components:   Troponin I (High Sensitivity) 53 (*)    All other components within normal limits  PROTIME-INR  TROPONIN I (HIGH SENSITIVITY)    EKG EKG Interpretation Date/Time:  Monday September 07 2023 06:11:20 EDT Ventricular Rate:  84 PR Interval:  206 QRS Duration:  94 QT Interval:  391 QTC Calculation: 463 R Axis:   20  Text Interpretation: Sinus rhythm Ventricular premature complex Anterior infarct, old Borderline repolarization abnormality Nonspecific T wave abnormality Confirmed by Glynn Octave [id number]) on 09/07/2023 6:15:51 AM  Radiology CT Head Wo Contrast  Result Date: 09/07/2023 CLINICAL DATA:  Fall with neck trauma and head injury. EXAM: CT HEAD WITHOUT CONTRAST CT CERVICAL SPINE WITHOUT CONTRAST TECHNIQUE: Multidetector CT imaging of the head and cervical spine was performed following the standard protocol without intravenous contrast. Multiplanar CT image reconstructions of the cervical spine were also generated. RADIATION DOSE REDUCTION: This exam was performed according to the departmental dose-optimization program which includes automated exposure control, adjustment of the mA and/or kV according to patient size and/or use of iterative reconstruction technique. COMPARISON:  None Available. FINDINGS: CT HEAD FINDINGS Brain: No evidence of acute infarction, hemorrhage, hydrocephalus, extra-axial collection or mass lesion/mass effect. Vascular: No hyperdense vessel or unexpected calcification. Skull: No acute fracture.  Hyperostosis interna. Sinuses/Orbits: No visible injury CT CERVICAL SPINE FINDINGS Alignment: Normal. Skull base and vertebrae: No  acute fracture Soft tissues and spinal canal: No prevertebral fluid or swelling. No visible canal hematoma. Left hemithyroidectomy. 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 Subjective osteopenia. Upper chest: No visible injury IMPRESSION: No evidence of acute intracranial or cervical spine injury. Electronically Signed   By: Tiburcio Pea M.D.   On: 09/07/2023 05:37   CT Cervical Spine Wo Contrast  Result Date: 09/07/2023 CLINICAL DATA:  Fall with neck trauma and head injury. EXAM: CT HEAD WITHOUT CONTRAST CT CERVICAL SPINE WITHOUT CONTRAST TECHNIQUE: Multidetector CT imaging of the head and cervical spine was performed following the standard protocol without intravenous contrast. Multiplanar CT image reconstructions of the cervical spine were also generated. RADIATION DOSE REDUCTION: This exam was performed according to the departmental dose-optimization program which includes automated exposure control, adjustment of the mA and/or kV according to patient size and/or use of iterative reconstruction technique. COMPARISON:  None Available. FINDINGS: CT HEAD FINDINGS Brain: No evidence of acute infarction, hemorrhage, hydrocephalus, extra-axial collection or mass lesion/mass effect. Vascular: No hyperdense vessel or unexpected calcification. Skull: No acute fracture.  Hyperostosis interna. Sinuses/Orbits: No visible injury CT CERVICAL SPINE FINDINGS Alignment: Normal. Skull base and vertebrae: No acute fracture Soft tissues and spinal canal: No prevertebral fluid or swelling. No visible canal hematoma. Left hemithyroidectomy. 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 Subjective osteopenia. Upper chest: No visible injury IMPRESSION: No evidence of acute intracranial or cervical spine injury. Electronically Signed   By: Tiburcio Pea M.D.   On: 09/07/2023 05:37   DG Chest Portable 1 View  Result Date: 09/07/2023 CLINICAL DATA:  Level 2 trauma due  to fall EXAM: PORTABLE CHEST 1 VIEW COMPARISON:  06/22/2023 FINDINGS: Cardiomegaly and chronic small pleural effusions. No pulmonary edema or consolidation. No pneumothorax. Dialysis catheter with tip at the upper cavoatrial junction. Extensive artifact from EKG leads. IMPRESSION: Cardiomegaly and small pleural effusions, chronic when compared to priors. Electronically Signed   By: Tiburcio Pea M.D.   On: 09/07/2023 05:24   DG Pelvis Portable  Result Date: 09/07/2023  CLINICAL DATA:  Level 2 trauma due to fall. EXAM: PORTABLE PELVIS 1 VIEWS COMPARISON:  None Available. FINDINGS: Limited by underpenetration. No evidence of pelvic ring or hip fracture. Lower lumbar spine degeneration. IMPRESSION: No acute finding. Electronically Signed   By: Tiburcio Pea M.D.   On: 09/07/2023 05:23    Procedures .[name] KitchenLaceration Repair  Date/Time: 09/07/2023 6:25 AM  Performed by: Glynn Octave, MD Authorized by: Glynn Octave, MD   Consent:    Consent obtained:  Verbal   Consent given by:  Patient   Risks, benefits, and alternatives were discussed: yes     Risks discussed:  Infection, need for additional repair, nerve damage, poor wound healing, retained foreign body, pain, poor cosmetic result, vascular damage and tendon damage   Alternatives discussed:  No treatment Universal protocol:    Procedure explained and questions answered to patient or proxy's satisfaction: yes     Immediately prior to procedure, a time out was called: yes     Patient identity confirmed:  Verbally with patient Anesthesia:    Anesthesia method:  Local infiltration   Local anesthetic:  Lidocaine 2% WITH epi Laceration details:    Location:  Scalp   Scalp location:  Occipital   Length (cm):  2 Pre-procedure details:    Preparation:  Patient was prepped and draped in usual sterile fashion and imaging obtained to evaluate for foreign bodies Exploration:    Hemostasis achieved with:  Direct pressure and epinephrine    Imaging outcome: foreign body not noted     Wound exploration: wound explored through full range of motion and entire depth of wound visualized     Wound extent: no underlying fracture and no vascular damage     Contaminated: no   Treatment:    Area cleansed with:  Povidone-iodine   Amount of cleaning:  Standard   Irrigation solution:  Sterile saline   Irrigation method:  Pressure wash   Debridement:  None   Undermining:  None Skin repair:    Repair method:  Staples   Number of staples:  3 Approximation:    Approximation:  Close Repair type:    Repair type:  Simple Post-procedure details:    Dressing:  Antibiotic ointment   Procedure completion:  Tolerated     Medications Ordered in ED Medications - No data to display  ED Course/ Medical Decision Making/ A&P                                 Medical Decision Making Amount and/or Complexity of Data Reviewed Independent Historian: EMS Labs: ordered. Decision-making details documented in ED Course. Radiology: ordered and independent interpretation performed. Decision-making details documented in ED Course. ECG/medicine tests: ordered and independent interpretation performed. Decision-making details documented in ED Course.  Risk Prescription drug management. Decision regarding hospitalization.  Episode of falling while waking up from sleep, questionable syncope.  Denies preceding dizziness or lightheadedness.  No chest pain or shortness of breath.  EKG is sinus rhythm, no prolonged QT, no Brugada.  There are nonspecific T wave changes inferiorly laterally similar to previous.  Neurological exam is nonfocal.  CT head and C-spine are negative for acute traumatic injury.  Does have scalp laceration which will be repaired.  Tetanus is up-to-date.  Concern for possible syncope.  Patient with no significant CAD with history of recurrent VT.  Did recently have echocardiogram August 29 that showed normal ejection with mild aortic  stenosis.   Laceration repaired as above.  Patient asymptomatic but is amnestic to episode.  This was not witnessed.  Concern for possible cardiogenic syncope given her known severe CAD and aortic stenosis with history of VT. Believes she may have been "sleepwalking" but has no history of such.  Observation admission d/w hospitalist team and Dr. Janalyn Shy.         Final Clinical Impression(s) / ED Diagnoses Final diagnoses:  Injury of head, initial encounter  Laceration of scalp without foreign body, initial encounter  Syncope and collapse    Rx / DC Orders ED Discharge Orders     None         Manas Hickling, Jeannett Senior, MD 09/07/23 [phone]

## 2023-09-07 NOTE — Progress Notes (Signed)
Orthopedic Tech Progress Note Patient Details:  Cassandra Baker 04-Feb-1941 [id number]  Patient ID: [name], female   DOB: [birth year], 82 y.o.   MRN: [id number] I attended trauma page Trinna Post 09/07/2023, 6:55 AM

## 2023-09-07 NOTE — ED Notes (Signed)
Pt gets dialysis M/W/F

## 2023-09-07 NOTE — Progress Notes (Signed)
[facility] Brief Note  Cassandra Baker admitted for evaluation of syncope with fall.  Nephrology consulted for routine dialysis needs.  HD due today  Seen and examined in the ED. No signs of distress/volume overload. On room air VSS Labs reviewed and WNL for ESRD patient.  No urgent dialysis indications tonight.  Plan for dialysis in the am. Orders written.  Full consult to follow.     Megan Mans Lurena Naeve PA-C 09/07/2023 5:02 PM

## 2023-09-07 NOTE — ED Triage Notes (Signed)
Pt states she woke up to find herself falling. Pt has a small cut on the back of her head on a stool. Pt reports feeling funny. Pt is unsure where her shirt is.   Left arm restricted.

## 2023-09-07 NOTE — ED Notes (Signed)
ED TO INPATIENT HANDOFF REPORT  ED Nurse Name and Phone #: Osvaldo Shipper RN [phone]  S Name/Age/Gender Cassandra Baker 82 y.o. female Room/Bed: 027C/027C  Code Status   Code Status: Prior  Home/SNF/Other Home Patient oriented to: self, place, time, and situation Is this baseline? Yes    Triage Complete: Triage complete  Chief Complaint fall  Triage Note Pt states she woke up to find herself falling. Pt has a small cut on the back of her head on a stool. Pt reports feeling funny. Pt is unsure where her shirt is.   Left arm restricted.     Allergies Allergies  Allergen Reactions   Plum Pulp Anaphylaxis and Cough   Almond (Diagnostic) Itching   Lemon Oil Other (See Comments)    Lemons cause coughing and respiratory issues    Other Itching    Dairy products/Soaps & detergents   Peanut-Containing Drug Products Itching   Penicillins Rash    Tolerates ceftriaxone    Level of Care/Admitting Diagnosis ED Disposition     ED Disposition  Admit   Condition  --   Comment  The patient appears reasonably stabilized for admission considering the current resources, flow, and capabilities available in the ED at this time, and I doubt any other [facility] requiring further screening and/or treatment in the ED prior to admission is  present.          B Medical/Surgery History Past Medical History:  Diagnosis Date   Anemia    Arthritis    low back,knees   Asthma    Basal cell carcinoma (BCC)    a. nose   CHF (congestive heart failure) (HCC)    a. prev eval by Dr. Katrinka Blazing in [place] - "many yrs ago."   CKD (chronic kidney disease), stage IV (HCC)    Depression    Diabetes mellitus without complication (HCC)    Dyspnea    Hypertension    Hypothyroidism    Past Surgical History:  Procedure Laterality Date   APPENDECTOMY     4 th grade   AV FISTULA PLACEMENT Left 05/12/2019   Procedure: BRACHIOCEPHALIC ARTERIOVENOUS (AV) FISTULA CREATION LEFT ARM;  Surgeon: Nada Libman,  MD;  [facility];  Service: Vascular;  Laterality: Left;   AV FISTULA PLACEMENT Left 06/22/2023   Procedure: LEFT BRACHIOBASILIC ARTERIOVENOUS (AV) FISTULA CREATION;  Surgeon: Victorino Sparrow, MD;  Location: [facility] OR;  Service: Vascular;  Laterality: Left;   CORONARY STENT INTERVENTION N/A 06/17/2023   Procedure: CORONARY STENT INTERVENTION;  Surgeon: Iran Ouch, MD;  [facility]: [facility] INVASIVE CV LAB;  Service: Cardiovascular;  Laterality: N/A;   EYE SURGERY  2014   Bilateral 6 mos. apart  cataracts   FISTULA SUPERFICIALIZATION Left 10/06/2019   Procedure: ELEVATION OF ARTERIOVENOUS FISTULA LEFT ARM;  Surgeon: Nada Libman, MD;  [facility] OR;  Service: Vascular;  Laterality: Left;   INSERTION OF DIALYSIS CATHETER N/A 06/22/2023   Procedure: INSERTION OF TUNNELED  DIALYSIS CATHETER USING 19CM PALINDROME CATHETER;  Surgeon: Victorino Sparrow, MD;  Location: [facility] OR;  Service: Vascular;  Laterality: N/A;   LYMPH NODE BIOPSY N/A 02/09/2018   Procedure: SENTINEL LYMPH NODE BIOPSY;  Surgeon: Adolphus Birchwood, MD;  Location: [facility] ORS;  Service: Gynecology;  Laterality: N/A;   PARATHYROIDECTOMY N/A 12/07/2014   Procedure: PARATHYROIDECTOMY;  Surgeon: Darnell Level, MD;  Location: [facility] ORS;  Service: General;  Laterality: N/A;   PERIPHERAL INTRAVASCULAR LITHOTRIPSY  06/17/2023   Procedure: PERIPHERAL INTRAVASCULAR LITHOTRIPSY;  Surgeon:  Iran Ouch, MD;  [facility] INV[facility] CV LAB;  Service: Cardiovascular;;   RIGHT/LEFT HEART CATH AND CORONARY ANGIOGRAPHY N/A 06/15/2023   Procedure: RIGHT/LEFT HEART CATH AND CORONARY ANGIOGRAPHY;  Surgeon: Iran Ouch, MD;  Location: [facility] INVASIVE CV LAB;  Service: Cardiovascular;  Laterality: N/A;   ROBOTIC ASSISTED TOTAL HYSTERECTOMY WITH BILATERAL SALPINGO OOPHERECTOMY Bilateral 02/09/2018   Procedure: XI ROBOTIC ASSISTED TOTAL HYSTERECTOMY WITH BILATERAL SALPINGO OOPHORECTOMY;  Surgeon: Adolphus Birchwood, MD;  Location: [facility] ORS;  Service: Gynecology;  Laterality:  Bilateral;   TEMPORARY DIALYSIS CATHETER N/A 06/12/2023   Procedure: TEMPORARY DIALYSIS CATHETER;  Surgeon: Renford Dills, MD;  Location: [facility] INVASIVE CV LAB;  Service: Cardiovascular;  Laterality: N/A;   THYROID LOBECTOMY Left 12/07/2014   Procedure: LEFT THYROID LOBECTOMY;  Surgeon: Darnell Level, MD;  Location: [facility] ORS;  Service: General;  Laterality: Left;   TONSILLECTOMY     TUBAL LIGATION     Early thirties     A IV Location/Drains/Wounds Patient Lines/Drains/Airways Status     Active Line/Drains/Airways     Name Placement date Placement time Site Days   Fistula / Graft Left Upper arm Arteriovenous fistula --  --  Upper arm  --   Fistula / Graft Left Upper arm Arteriovenous fistula 06/22/23  1348  Upper arm  77   Hemodialysis Catheter Right Subclavian Double lumen Permanent (Tunneled) 06/22/23  1316  Subclavian  77            Intake/Output Last 24 hours No intake or output data in the 24 hours ending 09/07/23 0740  Labs/Imaging Results for orders placed or performed during the hospital encounter of 09/07/23 (from the past 48 hour(s))  CBC with Differential     Status: Abnormal   Collection Time: 09/07/23  5:05 AM  Result Value Ref Range   WBC 7.0 4.0 - 10.5 K/uL   RBC 3.53 (L) 3.87 - 5.11 MIL/uL   Hemoglobin 10.7 (L) 12.0 - 15.0 g/dL   HCT 82.9 (L) 56.2 - 13.0 %   MCV 97.7 80.0 - 100.0 fL   MCH 30.3 26.0 - 34.0 pg   MCHC 31.0 30.0 - 36.0 g/dL   RDW 86.5 78.4 - 69.6 %   Platelets 105 (L) 150 - 400 K/uL    Comment: REPEATED TO VERIFY   nRBC 0.0 0.0 - 0.2 %   Neutrophils Relative % 78 %   Neutro Abs 5.4 1.7 - 7.7 K/uL   Lymphocytes Relative 13 %   Lymphs Abs 0.9 0.7 - 4.0 K/uL   Monocytes Relative 8 %   Monocytes Absolute 0.5 0.1 - 1.0 K/uL   Eosinophils Relative 1 %   Eosinophils Absolute 0.1 0.0 - 0.5 K/uL   Basophils Relative 0 %   Basophils Absolute 0.0 0.0 - 0.1 K/uL   Immature Granulocytes 0 %   Abs Immature Granulocytes 0.03 0.00 - 0.07 K/uL     Comment: Performed at [facility], [address] [place], [place] [address]  Comprehensive metabolic panel     Status: Abnormal   Collection Time: 09/07/23  5:05 AM  Result Value Ref Range   Sodium 141 135 - 145 mmol/L   Potassium 3.5 3.5 - 5.1 mmol/L   Chloride 102 98 - 111 mmol/L   CO2 23 22 - 32 mmol/L   Glucose, Bld 144 (H) 70 - 99 mg/dL    Comment: Glucose reference range applies only to samples taken after fasting for at least 8 hours.   BUN  57 (H) 8 - 23 mg/dL   Creatinine, Ser 8.29 (H) 0.44 - 1.00 mg/dL   Calcium 56.2 8.9 - 13.0 mg/dL   Total Protein 6.3 (L) 6.5 - 8.1 g/dL   Albumin 3.4 (L) 3.5 - 5.0 g/dL   AST 16 15 - 41 U/L   ALT 15 0 - 44 U/L   Alkaline Phosphatase 115 38 - 126 U/L   Total Bilirubin 0.3 0.3 - 1.2 mg/dL   GFR, Estimated 9 (L) >60 mL/min    Comment: (NOTE) Calculated using the CKD-EPI Creatinine Equation (2021)    Anion gap 16 (H) 5 - 15    Comment: Performed at [facility], [address] [place], [place] [address]  Troponin I (High Sensitivity)     Status: Abnormal   Collection Time: 09/07/23  5:05 AM  Result Value Ref Range   Troponin I (High Sensitivity) 53 (H) <18 ng/L    Comment: (NOTE) Elevated high sensitivity troponin I (hsTnI) values and significant  changes across serial measurements may suggest ACS but many other  chronic and acute conditions are known to elevate hsTnI results.  Refer to the "Links" section for chest pain algorithms and additional  guidance. Performed at [facility], [address], [place], [place] [address]   Protime-INR     Status: None   Collection Time: 09/07/23  5:05 AM  Result Value Ref Range   Prothrombin Time 14.3 11.4 - 15.2 seconds   INR 1.1 0.8 - 1.2    Comment: (NOTE) INR goal varies based on device and disease states. Performed at [facility], [address], [place], [place] [address]    CT Head Wo Contrast  Result Date: 09/07/2023 CLINICAL DATA:  Fall with neck  trauma and head injury. EXAM: CT HEAD WITHOUT CONTRAST CT CERVICAL SPINE WITHOUT CONTRAST TECHNIQUE: Multidetector CT imaging of the head and cervical spine was performed following the standard protocol without intravenous contrast. Multiplanar CT image reconstructions of the cervical spine were also generated. RADIATION DOSE REDUCTION: This exam was performed according to the departmental dose-optimization program which includes automated exposure control, adjustment of the mA and/or kV according to patient size and/or use of iterative reconstruction technique. COMPARISON:  None Available. FINDINGS: CT HEAD FINDINGS Brain: No evidence of acute infarction, hemorrhage, hydrocephalus, extra-axial collection or mass lesion/mass effect. Vascular: No hyperdense vessel or unexpected calcification. Skull: No acute fracture.  Hyperostosis interna. Sinuses/Orbits: No visible injury CT CERVICAL SPINE FINDINGS Alignment: Normal. Skull base and vertebrae: No acute fracture Soft tissues and spinal canal: No prevertebral fluid or swelling. No visible canal hematoma. Left hemithyroidectomy. 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 Subjective osteopenia. Upper chest: No visible injury IMPRESSION: No evidence of acute intracranial or cervical spine injury. Electronically Signed   By: Tiburcio Pea M.D.   On: 09/07/2023 05:37   CT Cervical Spine Wo Contrast  Result Date: 09/07/2023 CLINICAL DATA:  Fall with neck trauma and head injury. EXAM: CT HEAD WITHOUT CONTRAST CT CERVICAL SPINE WITHOUT CONTRAST TECHNIQUE: Multidetector CT imaging of the head and cervical spine was performed following the standard protocol without intravenous contrast. Multiplanar CT image reconstructions of the cervical spine were also generated. RADIATION DOSE REDUCTION: This exam was performed according to the departmental dose-optimization program which includes automated exposure control, adjustment of the mA and/or kV  according to patient size and/or use of iterative reconstruction technique. COMPARISON:  None Available. FINDINGS: CT HEAD FINDINGS Brain: No evidence of  acute infarction, hemorrhage, hydrocephalus, extra-axial collection or mass lesion/mass effect. Vascular: No hyperdense vessel or unexpected calcification. Skull: No acute fracture.  Hyperostosis interna. Sinuses/Orbits: No visible injury CT CERVICAL SPINE FINDINGS Alignment: Normal. Skull base and vertebrae: No acute fracture Soft tissues and spinal canal: No prevertebral fluid or swelling. No visible canal hematoma. Left hemithyroidectomy. 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 Subjective osteopenia. Upper chest: No visible injury IMPRESSION: No evidence of acute intracranial or cervical spine injury. Electronically Signed   By: Tiburcio Pea M.D.   On: 09/07/2023 05:37   DG Chest Portable 1 View  Result Date: 09/07/2023 CLINICAL DATA:  Level 2 trauma due to fall EXAM: PORTABLE CHEST 1 VIEW COMPARISON:  06/22/2023 FINDINGS: Cardiomegaly and chronic small pleural effusions. No pulmonary edema or consolidation. No pneumothorax. Dialysis catheter with tip at the upper cavoatrial junction. Extensive artifact from EKG leads. IMPRESSION: Cardiomegaly and small pleural effusions, chronic when compared to priors. Electronically Signed   By: Tiburcio Pea M.D.   On: 09/07/2023 05:24   DG Pelvis Portable  Result Date: 09/07/2023 CLINICAL DATA:  Level 2 trauma due to fall. EXAM: PORTABLE PELVIS 1 VIEWS COMPARISON:  None Available. FINDINGS: Limited by underpenetration. No evidence of pelvic ring or hip fracture. Lower lumbar spine degeneration. IMPRESSION: No acute finding. Electronically Signed   By: Tiburcio Pea M.D.   On: 09/07/2023 05:23    Pending Labs Unresulted Labs (From admission, onward)    None       Vitals/Pain Today's Vitals   09/07/23 0456 09/07/23 0501 09/07/23 0630 09/07/23 0704  BP:  (!) 160/78  (!) 159/65   Pulse:  86 84   Resp:  (!) 25 15   Temp:    98.9 F (37.2 C)  SpO2:  98% 95%   Weight: 68.7 kg     Height: 5\' 4"  (1.626 m)     PainSc: 0-No pain       Isolation Precautions No active isolations  Medications Medications  lidocaine-EPINEPHrine (XYLOCAINE W/EPI) 2 %-1:200000 (PF) injection 20 mL (has no administration in time range)    Mobility walks with person assist     Focused Assessments Fall on thinners   R Recommendations: See Admitting Provider Note  Report given to:   Additional Notes:

## 2023-09-07 NOTE — ED Notes (Signed)
Pt assisted to bathroom and able to ambulate with steady gait when walking with RN.

## 2023-09-07 NOTE — ED Notes (Signed)
Trauma Response Nurse Documentation   Cassandra Baker is a 82 y.o. female arriving to [facility] ED via [facility]  On clopidogrel 75 mg daily. Trauma was activated as a Level 2 by Koleen Distance based on the following trauma criteria Elderly patients > 65 with head trauma on anti-coagulation (excluding ASA).  Patient cleared for CT by Dr. Manus Gunning. Pt transported to CT with trauma response nurse present to monitor. RN remained with the patient throughout their absence from the department for clinical observation.   GCS 15.  History   Past Medical History:  Diagnosis Date   Anemia    Arthritis    low back,knees   Asthma    Basal cell carcinoma (BCC)    a. nose   CHF (congestive heart failure) (HCC)    a. prev eval by Dr. Katrinka Blazing in [place] - "many yrs ago."   CKD (chronic kidney disease), stage IV (HCC)    Depression    Diabetes mellitus without complication (HCC)    Dyspnea    Hypertension    Hypothyroidism      Past Surgical History:  Procedure Laterality Date   APPENDECTOMY     4 th grade   AV FISTULA PLACEMENT Left 05/12/2019   Procedure: BRACHIOCEPHALIC ARTERIOVENOUS (AV) FISTULA CREATION LEFT ARM;  Surgeon: Nada Libman, MD;  [facility] OR;  Service: Vascular;  Laterality: Left;   AV FISTULA PLACEMENT Left 06/22/2023   Procedure: LEFT BRACHIOBASILIC ARTERIOVENOUS (AV) FISTULA CREATION;  Surgeon: Victorino Sparrow, MD;  Location: [facility] OR;  Service: Vascular;  Laterality: Left;   CORONARY STENT INTERVENTION N/A 06/17/2023   Procedure: CORONARY STENT INTERVENTION;  Surgeon: Iran Ouch, MD;  [facility] INVASIVE CV LAB;  Service: Cardiovascular;  Laterality: N/A;   EYE SURGERY  2014   Bilateral 6 mos. apart  cataracts   FISTULA SUPERFICIALIZATION Left 10/06/2019   Procedure: ELEVATION OF ARTERIOVENOUS FISTULA LEFT ARM;  Surgeon: Nada Libman, MD;  [facility]: [facility] OR;  Service: Vascular;  Laterality: Left;   INSERTION OF DIALYSIS CATHETER N/A 06/22/2023    Procedure: INSERTION OF TUNNELED  DIALYSIS CATHETER USING 19CM PALINDROME CATHETER;  Surgeon: Victorino Sparrow, MD;  Location: [facility] OR;  Service: Vascular;  Laterality: N/A;   LYMPH NODE BIOPSY N/A 02/09/2018   Procedure: SENTINEL LYMPH NODE BIOPSY;  Surgeon: Adolphus Birchwood, MD;  Location: [facility] ORS;  Service: Gynecology;  Laterality: N/A;   PARATHYROIDECTOMY N/A 12/07/2014   Procedure: PARATHYROIDECTOMY;  Surgeon: Darnell Level, MD;  Location: [facility] ORS;  Service: General;  Laterality: N/A;   PERIPHERAL INTRAVASCULAR LITHOTRIPSY  06/17/2023   Procedure: PERIPHERAL INTRAVASCULAR LITHOTRIPSY;  Surgeon: Iran Ouch, MD;  [facility] LAB;  Service: Cardiovascular;;   RIGHT/LEFT HEART CATH AND CORONARY ANGIOGRAPHY N/A 06/15/2023   Procedure: RIGHT/LEFT HEART CATH AND CORONARY ANGIOGRAPHY;  Surgeon: Iran Ouch, MD;  Location: [facility] INVASIVE CV LAB;  Service: Cardiovascular;  Laterality: N/A;   ROBOTIC ASSISTED TOTAL HYSTERECTOMY WITH BILATERAL SALPINGO OOPHERECTOMY Bilateral 02/09/2018   Procedure: XI ROBOTIC ASSISTED TOTAL HYSTERECTOMY WITH BILATERAL SALPINGO OOPHORECTOMY;  Surgeon: Adolphus Birchwood, MD;  Location: [facility] ORS;  Service: Gynecology;  Laterality: Bilateral;   TEMPORARY DIALYSIS CATHETER N/A 06/12/2023   Procedure: TEMPORARY DIALYSIS CATHETER;  Surgeon: Renford Dills, MD;  Location: [facility] INVASIVE CV LAB;  Service: Cardiovascular;  Laterality: N/A;   THYROID LOBECTOMY Left 12/07/2014   Procedure: LEFT THYROID LOBECTOMY;  Surgeon: Darnell Level, MD;  Location: [facility] ORS;  Service: General;  Laterality: Left;   TONSILLECTOMY     TUBAL LIGATION     Early thirties       Initial Focused Assessment (If applicable, or please see trauma documentation): Airway-- intact, no visible obstruction Breathing-- spontaneous, unlabored Circulation-- small laceration to back of head, bleeding controlled on arrival to department  CT's Completed:   CT Head and CT C-Spine   Interventions:  See event  summary  Plan for disposition:  Unknown at time of this note.  Consults completed:  Unknown at this time.  Event Summary: Patient brought in by [facility] from hotel. States patient was sleep walking and awoke to herself falling, striking her head on a footstool. Patient arrives to department alert and oriented x4, GCS 15. Patient with small laceration to back of head, bleeding controlled on arrival to department. Patient transferred from EMS stretcher to hospital stretcher. Manual BP obtained. Lab work obtained. Patient log rolled by team. Xray chest and pelvis completed. Patient to CT. CT head and c-spine completed.  MTP Summary (If applicable):  N/A  Bedside handoff with ED RN Marchelle Folks.    [name]  Trauma Response RN  Please call TRN at [phone] for further assistance.

## 2023-09-08 ENCOUNTER — Observation Stay (HOSPITAL_COMMUNITY): Payer: Medicare Other

## 2023-09-08 ENCOUNTER — Observation Stay (HOSPITAL_BASED_OUTPATIENT_CLINIC_OR_DEPARTMENT_OTHER): Payer: Medicare Other

## 2023-09-08 DIAGNOSIS — G9001 Carotid sinus syncope: Secondary | ICD-10-CM | POA: Diagnosis not present

## 2023-09-08 DIAGNOSIS — R55 Syncope and collapse: Secondary | ICD-10-CM

## 2023-09-08 DIAGNOSIS — N186 End stage renal disease: Secondary | ICD-10-CM | POA: Diagnosis not present

## 2023-09-08 DIAGNOSIS — N250 Renal osteodystrophy: Secondary | ICD-10-CM | POA: Diagnosis not present

## 2023-09-08 DIAGNOSIS — W100XXS Fall (on)(from) escalator, sequela: Secondary | ICD-10-CM

## 2023-09-08 DIAGNOSIS — D631 Anemia in chronic kidney disease: Secondary | ICD-10-CM | POA: Diagnosis not present

## 2023-09-08 DIAGNOSIS — I120 Hypertensive chronic kidney disease with stage 5 chronic kidney disease or end stage renal disease: Secondary | ICD-10-CM | POA: Diagnosis not present

## 2023-09-08 DIAGNOSIS — Z992 Dependence on renal dialysis: Secondary | ICD-10-CM | POA: Diagnosis not present

## 2023-09-08 LAB — HEPATITIS B SURFACE ANTIGEN: Hepatitis B Surface Ag: NONREACTIVE

## 2023-09-08 LAB — MRSA NEXT GEN BY PCR, NASAL: MRSA by PCR Next Gen: NOT DETECTED

## 2023-09-08 MED ORDER — FUROSEMIDE 40 MG PO TABS
80.0000 mg | ORAL_TABLET | Freq: Two times a day (BID) | ORAL | Status: DC
  Administered 2023-09-08: 80 mg via ORAL
  Filled 2023-09-08: qty 2

## 2023-09-08 MED ORDER — ANTICOAGULANT SODIUM CITRATE 4% (200MG/5ML) IV SOLN: 5.0000 mL | Status: DC | PRN

## 2023-09-08 MED ORDER — ALTEPLASE 2 MG IJ SOLR: 2.0000 mg | Freq: Once | INTRAMUSCULAR | Status: DC | PRN

## 2023-09-08 MED ORDER — FUROSEMIDE 40 MG PO TABS: 80.0000 mg | ORAL_TABLET | Freq: Two times a day (BID) | ORAL | Status: DC

## 2023-09-08 MED ORDER — PENTAFLUOROPROP-TETRAFLUOROETH EX AERO: 1.0000 | INHALATION_SPRAY | CUTANEOUS | Status: DC | PRN

## 2023-09-08 MED ORDER — DOXERCALCIFEROL 4 MCG/2ML IV SOLN
1.0000 ug | Freq: Once | INTRAVENOUS | Status: AC
  Administered 2023-09-08: 1 ug via INTRAVENOUS
  Filled 2023-09-08 (×2): qty 2

## 2023-09-08 MED ORDER — NITROGLYCERIN 0.4 MG SL SUBL: 0.4000 mg | SUBLINGUAL_TABLET | SUBLINGUAL | Status: DC | PRN

## 2023-09-08 MED ORDER — HEPARIN SODIUM (PORCINE) 1000 UNIT/ML DIALYSIS
1000.0000 [IU] | INTRAMUSCULAR | Status: DC | PRN
  Administered 2023-09-08: 3200 [IU]
  Filled 2023-09-08: qty 1

## 2023-09-08 MED ORDER — LIDOCAINE-PRILOCAINE 2.5-2.5 % EX CREA: 1.0000 | TOPICAL_CREAM | CUTANEOUS | Status: DC | PRN

## 2023-09-08 MED ORDER — CHLORHEXIDINE GLUCONATE CLOTH 2 % EX PADS: 6.0000 | MEDICATED_PAD | Freq: Every day | CUTANEOUS | Status: DC

## 2023-09-08 MED ORDER — LIDOCAINE HCL (PF) 1 % IJ SOLN: 5.0000 mL | INTRAMUSCULAR | Status: DC | PRN

## 2023-09-08 MED ORDER — LORAZEPAM 2 MG/ML IJ SOLN
1.0000 mg | Freq: Once | INTRAMUSCULAR | Status: AC
  Administered 2023-09-08: 1 mg via INTRAVENOUS
  Filled 2023-09-08: qty 1

## 2023-09-08 MED ORDER — LINAGLIPTIN 5 MG PO TABS
5.0000 mg | ORAL_TABLET | Freq: Every day | ORAL | Status: DC
  Administered 2023-09-08: 5 mg via ORAL
  Filled 2023-09-08 (×2): qty 1

## 2023-09-08 MED ORDER — FERRIC CITRATE 1 GM 210 MG(FE) PO TABS
210.0000 mg | ORAL_TABLET | Freq: Three times a day (TID) | ORAL | Status: DC
  Filled 2023-09-08: qty 1

## 2023-09-08 NOTE — TOC CM/SW Note (Addendum)
Spoke to patient at bedside and sister in law Beaver [phone] via phone.   Patient states she does not live with her son, her home is being worked on so she cannot return there. First she stated she will find a hotel at discharge then stated she will find a nursing home. Explained to patient and Roddie Mc , patient would have to met medicare criteria for inpatient status to go to a nursing home/ SNF for short term rehab . Currently she has been changed to observation. Also she would need to need physical rehab. PT to see the patient.   TOC received a consult "Son kicked  her out, living in extended stay motel; frequent falls ". Roddie Mc does not believe her son kicked her out, she believes patient had an "issue" with her daughter in law and went to a hotel.   Roddie Mc does not feel that patient is cognitively at her baseline. Nurse and NCM have left messages for patient's son and grand daughter.   Roddie Mc has NCM direct phone number and will also try to reach patient's son    Patient's son returned call. Discussed concerns about discharge . MD thinks it is related to Ativan she received in dialysis  He will be here at 5 pm to pick her up. He is taking her to a Hotel in [place] to be closer to her dialysis center , there is a shuttle she can take to dialysis .

## 2023-09-08 NOTE — Plan of Care (Signed)
[facility] Patient Discharge Orders- [facility] CLINIC: [facility]  Patient's name: Cassandra Baker Admit/DC Dates: 09/07/2023 - 09/08/23  Discharge Diagnoses: Syncope with fall. Work-up unrevealing  Scalp laceration from fall   Aranesp: ---   Date and amount of last dose: --  Last Hgb: 10.7 PRBC's Given: -- Date/# of units: -- ESA dose for discharge: per protocol IV Iron dose at discharge: --  Heparin change: --  EDW Change: Well below EDW here. --> 64 kg  Bath Change: --  Access intervention/Change: --   Hectorol/Calcitriol change: --  Discharge Labs: Calcium 10.2 Phosphorus -- Albumin -- K+ 3.5  IV Antibiotics: --   On Coumadin: --    OTHER/APPTS/LAB ORDERS:    D/C Meds to be reconciled by nurse after every discharge.  Completed By: Tomasa Blase PA-C   Reviewed by: MD:______ RN_______

## 2023-09-08 NOTE — Procedures (Signed)
I was present at this dialysis session, have reviewed the session and made  appropriate changes Vinson Moselle MD  [facility] 09/08/2023, 9:53 AM

## 2023-09-08 NOTE — ED Notes (Signed)
ED TO INPATIENT HANDOFF REPORT  ED Nurse Name and Phone #: Pollie Meyer [phone]  S Name/Age/Gender Cassandra Baker 82 y.o. female Room/Bed: 046C/046C  Code Status   Code Status: Prior  Home/SNF/Other Home Patient oriented to: self, place, time, and situation Is this baseline? Yes   Triage Complete: Triage complete  Chief Complaint Syncope [R55]  Triage Note Pt states she woke up to find herself falling. Pt has a small cut on the back of her head on a stool. Pt reports feeling funny. Pt is unsure where her shirt is.   Left arm restricted.     Allergies Allergies  Allergen Reactions   Plum Pulp Anaphylaxis and Cough   Almond (Diagnostic) Itching   Lemon Oil Other (See Comments)    Lemons cause coughing and respiratory issues    Other Itching    Dairy products/Soaps & detergents   Peanut-Containing Drug Products Itching   Penicillins Rash    Tolerates ceftriaxone    Level of Care/Admitting Diagnosis ED Disposition     ED Disposition  Admit   Condition  --   Comment  Hospital Area: [facility] [[id number]]  Level of Care: Telemetry Cardiac [103]  May admit patient to [facility] or [facility] if equivalent level of care is available:: No  Covid Evaluation: Asymptomatic - no recent exposure (last 10 days) testing not required  Diagnosis: Syncope [206001]  Admitting Physician: Lurline Del [[id number]]  Attending Physician: Lurline Del [[id number]]  Certification:: I certify this patient will need inpatient services for at least 2 midnights  Expected Medical Readiness: 09/10/2023          B Medical/Surgery History Past Medical History:  Diagnosis Date   Anemia    Arthritis    low back,knees   Asthma    Basal cell carcinoma (BCC)    a. nose   CHF (congestive heart failure) (HCC)    a. prev eval by Dr. Katrinka Blazing in [place] - "many yrs ago."   CKD (chronic kidney disease), stage IV (HCC)    Depression    Diabetes mellitus without  complication (HCC)    Dyspnea    Hypertension    Hypothyroidism    Past Surgical History:  Procedure Laterality Date   APPENDECTOMY     4 th grade   AV FISTULA PLACEMENT Left 05/12/2019   Procedure: BRACHIOCEPHALIC ARTERIOVENOUS (AV) FISTULA CREATION LEFT ARM;  Surgeon: Nada Libman, MD;  Location: [facility] OR;  Service: Vascular;  Laterality: Left;   AV FISTULA PLACEMENT Left 06/22/2023   Procedure: LEFT BRACHIOBASILIC ARTERIOVENOUS (AV) FISTULA CREATION;  Surgeon: Victorino Sparrow, MD;  Location: [facility] OR;  Service: Vascular;  Laterality: Left;   CORONARY STENT INTERVENTION N/A 06/17/2023   Procedure: CORONARY STENT INTERVENTION;  Surgeon: Iran Ouch, MD;  Location: [facility] INVASIVE CV LAB;  Service: Cardiovascular;  Laterality: N/A;   EYE SURGERY  2014   Bilateral 6 mos. apart  cataracts   FISTULA SUPERFICIALIZATION Left 10/06/2019   Procedure: ELEVATION OF ARTERIOVENOUS FISTULA LEFT ARM;  Surgeon: Nada Libman, MD;  Location: [facility] OR;  Service: Vascular;  Laterality: Left;   INSERTION OF DIALYSIS CATHETER N/A 06/22/2023   Procedure: INSERTION OF TUNNELED  DIALYSIS CATHETER USING 19CM PALINDROME CATHETER;  Surgeon: Victorino Sparrow, MD;  Location: [facility] OR;  Service: Vascular;  Laterality: N/A;   LYMPH NODE BIOPSY N/A 02/09/2018   Procedure: SENTINEL LYMPH NODE BIOPSY;  Surgeon: Adolphus Birchwood, MD;  Location: [facility] ORS;  Service:  Gynecology;  Laterality: N/A;   PARATHYROIDECTOMY N/A 12/07/2014   Procedure: PARATHYROIDECTOMY;  Surgeon: Darnell Level, MD;  Location: [facility] ORS;  Service: General;  Laterality: N/A;   PERIPHERAL INTRAVASCULAR LITHOTRIPSY  06/17/2023   Procedure: PERIPHERAL INTRAVASCULAR LITHOTRIPSY;  Surgeon: Iran Ouch, MD;  Location: [facility] INVASIVE CV LAB;  Service: Cardiovascular;;   RIGHT/LEFT HEART CATH AND CORONARY ANGIOGRAPHY N/A 06/15/2023   Procedure: RIGHT/LEFT HEART CATH AND CORONARY ANGIOGRAPHY;  Surgeon: Iran Ouch, MD;  Location: [facility] INVASIVE CV LAB;  Service:  Cardiovascular;  Laterality: N/A;   ROBOTIC ASSISTED TOTAL HYSTERECTOMY WITH BILATERAL SALPINGO OOPHERECTOMY Bilateral 02/09/2018   Procedure: XI ROBOTIC ASSISTED TOTAL HYSTERECTOMY WITH BILATERAL SALPINGO OOPHORECTOMY;  Surgeon: Adolphus Birchwood, MD;  Location: [facility] ORS;  Service: Gynecology;  Laterality: Bilateral;   TEMPORARY DIALYSIS CATHETER N/A 06/12/2023   Procedure: TEMPORARY DIALYSIS CATHETER;  Surgeon: Renford Dills, MD;  Location: [facility] INVASIVE CV LAB;  Service: Cardiovascular;  Laterality: N/A;   THYROID LOBECTOMY Left 12/07/2014   Procedure: LEFT THYROID LOBECTOMY;  Surgeon: Darnell Level, MD;  Location: [facility] ORS;  Service: General;  Laterality: Left;   TONSILLECTOMY     TUBAL LIGATION     Early thirties     A IV Location/Drains/Wounds Patient Lines/Drains/Airways Status     Active Line/Drains/Airways     Name Placement date Placement time Site Days   Fistula / Graft Left Upper arm Arteriovenous fistula --  --  Upper arm  --   Fistula / Graft Left Upper arm Arteriovenous fistula 06/22/23  1348  Upper arm  78   Hemodialysis Catheter Right Subclavian Double lumen Permanent (Tunneled) 06/22/23  1316  Subclavian  78            Intake/Output Last 24 hours No intake or output data in the 24 hours ending 09/08/23 0137  Labs/Imaging Results for orders placed or performed during the hospital encounter of 09/07/23 (from the past 48 hour(s))  CBC with Differential     Status: Abnormal   Collection Time: 09/07/23  5:05 AM  Result Value Ref Range   WBC 7.0 4.0 - 10.5 K/uL   RBC 3.53 (L) 3.87 - 5.11 MIL/uL   Hemoglobin 10.7 (L) 12.0 - 15.0 g/dL   HCT 78.2 (L) 95.6 - 21.3 %   MCV 97.7 80.0 - 100.0 fL   MCH 30.3 26.0 - 34.0 pg   MCHC 31.0 30.0 - 36.0 g/dL   RDW 08.6 57.8 - 46.9 %   Platelets 105 (L) 150 - 400 K/uL    Comment: REPEATED TO VERIFY   nRBC 0.0 0.0 - 0.2 %   Neutrophils Relative % 78 %   Neutro Abs 5.4 1.7 - 7.7 K/uL   Lymphocytes Relative 13 %   Lymphs Abs 0.9 0.7  - 4.0 K/uL   Monocytes Relative 8 %   Monocytes Absolute 0.5 0.1 - 1.0 K/uL   Eosinophils Relative 1 %   Eosinophils Absolute 0.1 0.0 - 0.5 K/uL   Basophils Relative 0 %   Basophils Absolute 0.0 0.0 - 0.1 K/uL   Immature Granulocytes 0 %   Abs Immature Granulocytes 0.03 0.00 - 0.07 K/uL    Comment: Performed at [facility], [address] [place], [place] [address]  Comprehensive metabolic panel     Status: Abnormal   Collection Time: 09/07/23  5:05 AM  Result Value Ref Range   Sodium 141 135 - 145 mmol/L   Potassium 3.5 3.5 - 5.1 mmol/L   Chloride 102 98 -  111 mmol/L   CO2 23 22 - 32 mmol/L   Glucose, Bld 144 (H) 70 - 99 mg/dL    Comment: Glucose reference range applies only to samples taken after fasting for at least 8 hours.   BUN 57 (H) 8 - 23 mg/dL   Creatinine, Ser 6.96 (H) 0.44 - 1.00 mg/dL   Calcium 29.5 8.9 - 28.4 mg/dL   Total Protein 6.3 (L) 6.5 - 8.1 g/dL   Albumin 3.4 (L) 3.5 - 5.0 g/dL   AST 16 15 - 41 U/L   ALT 15 0 - 44 U/L   Alkaline Phosphatase 115 38 - 126 U/L   Total Bilirubin 0.3 0.3 - 1.2 mg/dL   GFR, Estimated 9 (L) >60 mL/min    Comment: (NOTE) Calculated using the CKD-EPI Creatinine Equation (2021)    Anion gap 16 (H) 5 - 15    Comment: Performed at [facility], [address] [place], [place] [address]  Troponin I (High Sensitivity)     Status: Abnormal   Collection Time: 09/07/23  5:05 AM  Result Value Ref Range   Troponin I (High Sensitivity) 53 (H) <18 ng/L    Comment: (NOTE) Elevated high sensitivity troponin I (hsTnI) values and significant  changes across serial measurements may suggest ACS but many other  chronic and acute conditions are known to elevate hsTnI results.  Refer to the "Links" section for chest pain algorithms and additional  guidance. Performed at [facility], [address], [place], [place] [address]   Protime-INR     Status: None   Collection Time: 09/07/23  5:05 AM  Result Value Ref Range    Prothrombin Time 14.3 11.4 - 15.2 seconds   INR 1.1 0.8 - 1.2    Comment: (NOTE) INR goal varies based on device and disease states. Performed at [facility], [address] [place], [place] [address]   Troponin I (High Sensitivity)     Status: Abnormal   Collection Time: 09/07/23  8:48 AM  Result Value Ref Range   Troponin I (High Sensitivity) 48 (H) <18 ng/L    Comment: (NOTE) Elevated high sensitivity troponin I (hsTnI) values and significant  changes across serial measurements may suggest ACS but many other  chronic and acute conditions are known to elevate hsTnI results.  Refer to the "Links" section for chest pain algorithms and additional  guidance. Performed at [facility], [address] [place], [place] [address]    CT Head Wo Contrast  Result Date: 09/07/2023 CLINICAL DATA:  Fall with neck trauma and head injury. EXAM: CT HEAD WITHOUT CONTRAST CT CERVICAL SPINE WITHOUT CONTRAST TECHNIQUE: Multidetector CT imaging of the head and cervical spine was performed following the standard protocol without intravenous contrast. Multiplanar CT image reconstructions of the cervical spine were also generated. RADIATION DOSE REDUCTION: This exam was performed according to the departmental dose-optimization program which includes automated exposure control, adjustment of the mA and/or kV according to patient size and/or use of iterative reconstruction technique. COMPARISON:  None Available. FINDINGS: CT HEAD FINDINGS Brain: No evidence of acute infarction, hemorrhage, hydrocephalus, extra-axial collection or mass lesion/mass effect. Vascular: No hyperdense vessel or unexpected calcification. Skull: No acute fracture.  Hyperostosis interna. Sinuses/Orbits: No visible injury CT CERVICAL SPINE FINDINGS Alignment: Normal. Skull base and vertebrae: No acute fracture Soft tissues and spinal canal: No prevertebral fluid or swelling. No visible canal hematoma. Left hemithyroidectomy. 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 Subjective osteopenia. Upper chest: No visible injury IMPRESSION: No evidence of acute intracranial or cervical spine injury. Electronically Signed   By: Tiburcio Pea M.D.   On: 09/07/2023 05:37   CT Cervical Spine Wo Contrast  Result Date: 09/07/2023 CLINICAL DATA:  Fall with neck trauma and head injury. EXAM: CT HEAD WITHOUT CONTRAST CT CERVICAL SPINE WITHOUT CONTRAST TECHNIQUE: Multidetector CT imaging of the head and cervical spine was performed following the standard protocol without intravenous contrast. Multiplanar CT image reconstructions of the cervical spine were also generated. RADIATION DOSE REDUCTION: This exam was performed according to the departmental dose-optimization program which includes automated exposure control, adjustment of the mA and/or kV according to patient size and/or use of iterative reconstruction technique. COMPARISON:  None Available. FINDINGS: CT HEAD FINDINGS Brain: No evidence of acute infarction, hemorrhage, hydrocephalus, extra-axial collection or mass lesion/mass effect. Vascular: No hyperdense vessel or unexpected calcification. Skull: No acute fracture.  Hyperostosis interna. Sinuses/Orbits: No visible injury CT CERVICAL SPINE FINDINGS Alignment: Normal. Skull base and vertebrae: No acute fracture Soft tissues and spinal canal: No prevertebral fluid or swelling. No visible canal hematoma. Left hemithyroidectomy. 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 Subjective osteopenia. Upper chest: No visible injury IMPRESSION: No evidence of acute intracranial or cervical spine injury. Electronically Signed   By: Tiburcio Pea M.D.   On: 09/07/2023 05:37   DG Chest Portable 1 View  Result Date: 09/07/2023 CLINICAL DATA:  Level 2 trauma due to fall EXAM: PORTABLE CHEST 1 VIEW COMPARISON:  06/22/2023 FINDINGS: Cardiomegaly and chronic small pleural effusions. No pulmonary edema  or consolidation. No pneumothorax. Dialysis catheter with tip at the upper cavoatrial junction. Extensive artifact from EKG leads. IMPRESSION: Cardiomegaly and small pleural effusions, chronic when compared to priors. Electronically Signed   By: Tiburcio Pea M.D.   On: 09/07/2023 05:24   DG Pelvis Portable  Result Date: 09/07/2023 CLINICAL DATA:  Level 2 trauma due to fall. EXAM: PORTABLE PELVIS 1 VIEWS COMPARISON:  None Available. FINDINGS: Limited by underpenetration. No evidence of pelvic ring or hip fracture. Lower lumbar spine degeneration. IMPRESSION: No acute finding. Electronically Signed   By: Tiburcio Pea M.D.   On: 09/07/2023 05:23    Pending Labs Unresulted Labs (From admission, onward)     Start     Ordered   09/08/23 0500  Hepatitis B surface antigen  (New Admission [facility]o Labs (Hepatitis B))  Tomorrow morning,   R        09/07/23 1708   09/08/23 0500  Hepatitis B surface antibody,quantitative  (New Admission [facility] (Hepatitis B))  Tomorrow morning,   R        09/07/23 1708            Vitals/Pain Today's Vitals   09/07/23 2030 09/07/23 2124 09/08/23 0000 09/08/23 0015  BP:  115/61 (!) 155/67 (!) 160/64  Pulse: 78 77 80 81  Resp: 16 (!) 22  18  Temp:  98.6 F (37 C)    TempSrc:  Oral    SpO2: 98% 97% 96% 97%  Weight:      Height:      PainSc:        Isolation Precautions No active isolations  Medications Medications  acetaminophen (TYLENOL) tablet 500-1,000 mg (has no administration in time range)  aspirin EC tablet 81 mg (81 mg Oral Given 09/07/23 1022)  amiodarone (PACERONE) tablet 200 mg (200 mg Oral Given 09/07/23 1021)  levothyroxine (SYNTHROID) tablet 75 mcg (  75 mcg Oral Given 09/07/23 1022)  sodium bicarbonate tablet 650 mg (650 mg Oral Given 09/07/23 2201)  ferric citrate (AURYXIA) tablet 210 mg (210 mg Oral Patient Refused/Not Given 09/07/23 1710)  clopidogrel (PLAVIX) tablet 75 mg (75 mg Oral Given 09/07/23 1022)  loratadine (CLARITIN) tablet 5  mg (has no administration in time range)  allopurinol (ZYLOPRIM) tablet 100 mg (100 mg Oral Patient Refused/Not Given 09/07/23 1022)  pneumococcal 20-valent conjugate vaccine (PREVNAR 20) injection 0.5 mL (has no administration in time range)  influenza vaccine adjuvanted (FLUAD) injection 0.5 mL (has no administration in time range)  Chlorhexidine Gluconate Cloth 2 % PADS 6 each (has no administration in time range)  furosemide (LASIX) tablet 80 mg (has no administration in time range)  nitroGLYCERIN (NITROSTAT) SL tablet 0.4 mg (has no administration in time range)  linagliptin (TRADJENTA) tablet 5 mg (has no administration in time range)  lidocaine-EPINEPHrine (XYLOCAINE W/EPI) 2 %-1:200000 (PF) injection 20 mL (20 mLs Infiltration Given 09/07/23 0600)    Mobility walks with device     Focused Assessments Cardiac Assessment Handoff:    No results found for: "CKTOTAL", "CKMB", "CKMBINDEX", "TROPONINI" No results found for: "DDIMER" Does the Patient currently have chest pain? No   , Neuro Assessment Handoff:  Swallow screen pass? Yes          Neuro Assessment: Within Defined Limits Neuro Checks:      Has TPA been given? No If patient is a Neuro Trauma and patient is going to OR before floor call report to 4N Charge nurse: [phone] or [phone]  , Pulmonary Assessment Handoff:  Lung sounds:   O2 Device: Room Air      R Recommendations: See Admitting Provider Note  Report given to:   Additional Notes:

## 2023-09-08 NOTE — Plan of Care (Signed)
Problem: Education: Goal: Understanding of cardiac disease, CV risk reduction, and recovery process will improve Outcome: Adequate for Discharge Goal: Individualized Educational Video(s) Outcome: Adequate for Discharge   Problem: Activity: Goal: Ability to tolerate increased activity will improve Outcome: Adequate for Discharge   Problem: Cardiac: Goal: Ability to achieve and maintain adequate cardiovascular perfusion will improve Outcome: Adequate for Discharge   Problem: Health Behavior/Discharge Planning: Goal: Ability to safely manage health-related needs after discharge will improve Outcome: Adequate for Discharge   Problem: Education: Goal: Understanding of CV disease, CV risk reduction, and recovery process will improve Outcome: Adequate for Discharge Goal: Individualized Educational Video(s) Outcome: Adequate for Discharge   Problem: Activity: Goal: Ability to return to baseline activity level will improve Outcome: Adequate for Discharge   Problem: Cardiovascular: Goal: Ability to achieve and maintain adequate cardiovascular perfusion will improve Outcome: Adequate for Discharge Goal: Vascular access site(s) Level 0-1 will be maintained Outcome: Adequate for Discharge   Problem: Health Behavior/Discharge Planning: Goal: Ability to safely manage health-related needs after discharge will improve Outcome: Adequate for Discharge   Problem: Education: Goal: Knowledge of General Education information will improve Description: Including pain rating scale, medication(s)/side effects and non-pharmacologic comfort measures Outcome: Adequate for Discharge   Problem: Health Behavior/Discharge Planning: Goal: Ability to manage health-related needs will improve Outcome: Adequate for Discharge   Problem: Clinical Measurements: Goal: Ability to maintain clinical measurements within normal limits will improve Outcome: Adequate for Discharge Goal: Will remain free from  infection Outcome: Adequate for Discharge Goal: Diagnostic test results will improve Outcome: Adequate for Discharge Goal: Respiratory complications will improve Outcome: Adequate for Discharge Goal: Cardiovascular complication will be avoided Outcome: Adequate for Discharge   Problem: Activity: Goal: Risk for activity intolerance will decrease Outcome: Adequate for Discharge   Problem: Nutrition: Goal: Adequate nutrition will be maintained Outcome: Adequate for Discharge   Problem: Coping: Goal: Level of anxiety will decrease Outcome: Adequate for Discharge   Problem: Elimination: Goal: Will not experience complications related to bowel motility Outcome: Adequate for Discharge Goal: Will not experience complications related to urinary retention Outcome: Adequate for Discharge   Problem: Pain Managment: Goal: General experience of comfort will improve Outcome: Adequate for Discharge   Problem: Safety: Goal: Ability to remain free from injury will improve Outcome: Adequate for Discharge   Problem: Skin Integrity: Goal: Risk for impaired skin integrity will decrease Outcome: Adequate for Discharge

## 2023-09-08 NOTE — Care Management CC44 (Signed)
Condition Code 44 Documentation Completed  Patient Details  Name: Cassandra Baker MRN: [id number] Date of Birth: March 18, 1941   Condition Code 44 given:  Yes Patient signature on Condition Code 44 notice:  Yes Documentation of 2 MD's agreement:  Yes Code 44 added to claim:  Yes    Kingsley Plan, RN 09/08/2023, 2:19 PM

## 2023-09-08 NOTE — Progress Notes (Signed)
Carotid artery duplex has been completed. Preliminary results can be found in CV Proc through chart review.   09/08/23 4:00 PM Olen Cordial RVT

## 2023-09-08 NOTE — Progress Notes (Signed)
At shift assessment, pt alert, oriented x 4. Answering all questions appropriately. Pt reports aware of being confused of where she was earlier and thinks it was due to ativan she received in HD. Denies feeling any confusion at this time and no confusion noted by RN. Ambulating in room with steady gait. Son called and given update on phone; son states he will be coming to pick her up this evening and will be staying the night with her tonight.   Son arrived around 2230. Pt sleeping in bed when son arrived. Pt woke up slowly, irritated at being woke up but then alert and answered all orientation questions correctly with son in room. Pt correctly recalling events of earlier today; no confusion noted. Son states pt is back to baseline and son says he is comfortable with discharge and will be staying with her tonight. Written AVS instructions and meds reviewed with pt and son. Pt verbalized understanding of instructions and plan for resuming regular dialysis tomorrow. She also states she will call PCP tomorrow for follow up appointment to have staples out.   Room checked and confirmed pt has all belongings, including purse, wallet, glasses and cellphone. Left unit via wheelchair with NT to help pt to car; son will drive pt.

## 2023-09-08 NOTE — Plan of Care (Signed)
Problem: Clinical Measurements: Goal: Diagnostic test results will improve Outcome: Progressing Goal: Respiratory complications will improve Outcome: Progressing Goal: Cardiovascular complication will be avoided Outcome: Progressing   Problem: Coping: Goal: Level of anxiety will decrease Outcome: Progressing   Problem: Pain Managment: Goal: General experience of comfort will improve Outcome: Progressing   Problem: Safety: Goal: Ability to remain free from injury will improve Outcome: Progressing
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
## 2023-09-08 NOTE — Discharge Summary (Signed)
trauma and head injury. EXAM: CT HEAD WITHOUT CONTRAST CT CERVICAL SPINE WITHOUT CONTRAST TECHNIQUE: Multidetector CT imaging of the head and cervical spine was performed following the standard protocol without intravenous contrast. Multiplanar CT image reconstructions of the cervical spine were also generated. RADIATION DOSE REDUCTION: This exam was performed according to the departmental dose-optimization program which includes automated exposure control, adjustment of the mA and/or kV according to patient size and/or use of iterative reconstruction technique. COMPARISON:  None Available. FINDINGS: CT HEAD  FINDINGS Brain: No evidence of acute infarction, hemorrhage, hydrocephalus, extra-axial collection or mass lesion/mass effect. Vascular: No hyperdense vessel or unexpected calcification. Skull: No acute fracture.  Hyperostosis interna. Sinuses/Orbits: No visible injury CT CERVICAL SPINE FINDINGS Alignment: Normal. Skull base and vertebrae: No acute fracture Soft tissues and spinal canal: No prevertebral fluid or swelling. No visible canal hematoma. Left hemithyroidectomy. 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 Subjective osteopenia. Upper chest: No visible injury IMPRESSION: No evidence of acute intracranial or cervical spine injury. Electronically Signed   By: Tiburcio Pea M.D.   On: 09/07/2023 05:37   DG Chest Portable 1 View  Result Date: 09/07/2023 CLINICAL DATA:  Level 2 trauma due to fall EXAM: PORTABLE CHEST 1 VIEW COMPARISON:  06/22/2023 FINDINGS: Cardiomegaly and chronic small pleural effusions. No pulmonary edema or consolidation. No pneumothorax. Dialysis catheter with tip at the upper cavoatrial junction. Extensive artifact from EKG leads. IMPRESSION: Cardiomegaly and small pleural effusions, chronic when compared to priors. Electronically Signed   By: Tiburcio Pea M.D.   On: 09/07/2023 05:24   DG Pelvis Portable  Result Date: 09/07/2023 CLINICAL DATA:  Level 2 trauma due to fall. EXAM: PORTABLE PELVIS 1 VIEWS COMPARISON:  None Available. FINDINGS: Limited by underpenetration. No evidence of pelvic ring or hip fracture. Lower lumbar spine degeneration. IMPRESSION: No acute finding. Electronically Signed   By: Tiburcio Pea M.D.   On: 09/07/2023 05:23   ECHOCARDIOGRAM COMPLETE  Result Date: 08/20/2023    ECHOCARDIOGRAM REPORT   Patient Name:   Cassandra Baker Date of Exam: 08/20/2023 Medical Rec #:  [id number]      Height:       64.0 in Accession #:    [id number]     Weight:       151.5 lb Date of Birth:  [birth year]      BSA:          1.739 m Patient Age:     81 years       BP:           136/67 mmHg Patient Gender: F              HR:           77 bpm. 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 Chronic combined CHF, CAD and Previous                 Myocardial Infarction, ESRD; Risk Factors:Diabetes and                 Hypertension. Previous LVEF 35% and mild LVH.  Sonographer:    Chanetta Marshall BA, RDCS Referring Phys: [phone] TESSA N CONTE IMPRESSIONS  trauma and head injury. EXAM: CT HEAD WITHOUT CONTRAST CT CERVICAL SPINE WITHOUT CONTRAST TECHNIQUE: Multidetector CT imaging of the head and cervical spine was performed following the standard protocol without intravenous contrast. Multiplanar CT image reconstructions of the cervical spine were also generated. RADIATION DOSE REDUCTION: This exam was performed according to the departmental dose-optimization program which includes automated exposure control, adjustment of the mA and/or kV according to patient size and/or use of iterative reconstruction technique. COMPARISON:  None Available. FINDINGS: CT HEAD  FINDINGS Brain: No evidence of acute infarction, hemorrhage, hydrocephalus, extra-axial collection or mass lesion/mass effect. Vascular: No hyperdense vessel or unexpected calcification. Skull: No acute fracture.  Hyperostosis interna. Sinuses/Orbits: No visible injury CT CERVICAL SPINE FINDINGS Alignment: Normal. Skull base and vertebrae: No acute fracture Soft tissues and spinal canal: No prevertebral fluid or swelling. No visible canal hematoma. Left hemithyroidectomy. 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 Subjective osteopenia. Upper chest: No visible injury IMPRESSION: No evidence of acute intracranial or cervical spine injury. Electronically Signed   By: Tiburcio Pea M.D.   On: 09/07/2023 05:37   DG Chest Portable 1 View  Result Date: 09/07/2023 CLINICAL DATA:  Level 2 trauma due to fall EXAM: PORTABLE CHEST 1 VIEW COMPARISON:  06/22/2023 FINDINGS: Cardiomegaly and chronic small pleural effusions. No pulmonary edema or consolidation. No pneumothorax. Dialysis catheter with tip at the upper cavoatrial junction. Extensive artifact from EKG leads. IMPRESSION: Cardiomegaly and small pleural effusions, chronic when compared to priors. Electronically Signed   By: Tiburcio Pea M.D.   On: 09/07/2023 05:24   DG Pelvis Portable  Result Date: 09/07/2023 CLINICAL DATA:  Level 2 trauma due to fall. EXAM: PORTABLE PELVIS 1 VIEWS COMPARISON:  None Available. FINDINGS: Limited by underpenetration. No evidence of pelvic ring or hip fracture. Lower lumbar spine degeneration. IMPRESSION: No acute finding. Electronically Signed   By: Tiburcio Pea M.D.   On: 09/07/2023 05:23   ECHOCARDIOGRAM COMPLETE  Result Date: 08/20/2023    ECHOCARDIOGRAM REPORT   Patient Name:   Cassandra Baker Date of Exam: 08/20/2023 Medical Rec #:  [id number]      Height:       64.0 in Accession #:    [id number]     Weight:       151.5 lb Date of Birth:  [birth year]      BSA:          1.739 m Patient Age:     81 years       BP:           136/67 mmHg Patient Gender: F              HR:           77 bpm. 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 Chronic combined CHF, CAD and Previous                 Myocardial Infarction, ESRD; Risk Factors:Diabetes and                 Hypertension. Previous LVEF 35% and mild LVH.  Sonographer:    Chanetta Marshall BA, RDCS Referring Phys: [phone] TESSA N CONTE IMPRESSIONS  Physician Discharge Summary  Cassandra Baker BJY:[id number] DOB: 1941-07-16 DOA: 09/07/2023  PCP: Renford Dills, MD  Admit date: 09/07/2023 Discharge date: 09/08/2023  Time spent: 50 minutes  Recommendations for Outpatient Follow-up:  Please ensure that scalp sutures are removed in the outpatient setting Challenge EDW as possible versus discontinue Lasix to prevent fluid shifts Needs outpatient follow-up with cardiology for usual medical management Recommend TSH Chem-12 in about 3 to 4 weeks as is on amiodarone  Discharge Diagnoses:  MAIN problem for hospitalization   Simple syncope  Please see below for itemized issues addressed in HOpsital- refer to other progress notes for clarity if needed  Discharge Condition: Improved  Diet recommendation: Renal  Filed Weights   09/08/23 0232 09/08/23 0830 09/08/23 1230  Weight: 65.6 kg 65.6 kg 63 kg    History of present illness:  82 year old white female known history of endometrial cancer status post resection Dr. 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  Patient was admitted with syncope which did not seem to have any scary features-orthostatics were negative, patient ambulated with physical therapy and also ambulated upstairs-negotiated stairs well and requires some supervision She lives at a hotel at this time because her house is being renovated-she was given Ativan during hospital stay which might of clouded her mentation-from my perspective she seems quite stable for discharge I have canceled the echocardiogram as she had one recently 08/20/2023 which showed mild AS grade 2 diastolic dysfunction EF 60 to 08% and I would not expect this with progression Because she had a fall with a scalp laceration we will see if she can get into see her PCP From my perspective as long as she has follow-up for her superficial laceration to the head she could go home   Discharge Exam: Vitals:   09/08/23 1225 09/08/23 1230  BP: (!) 116/91 (!) 143/59  Pulse: 70 74  Resp: (!) 21 (!) 23  Temp:  98.2 F (36.8 C)  SpO2: 96% 96%    Subj on day of d/c   Awake somewhat coherent.  Talks tangentially at times but looks well  General Exam on discharge  EOMI NCAT flat affect Chest clear no added sound S1-S2 no murmur Abdomen soft no rebound ROM intact Power 5/5 grossly  Discharge Instructions    Allergies as of 09/08/2023       Reactions   Plum Pulp Anaphylaxis, Cough   Almond (diagnostic) Itching   Lemon Oil Other (See Comments)   Lemons cause coughing and respiratory issues    Other Itching   Dairy products/Soaps & detergents   Peanut-containing Drug Products Itching   Penicillins Rash  Physician Discharge Summary  Cassandra Baker BJY:[id number] DOB: 1941-07-16 DOA: 09/07/2023  PCP: Renford Dills, MD  Admit date: 09/07/2023 Discharge date: 09/08/2023  Time spent: 50 minutes  Recommendations for Outpatient Follow-up:  Please ensure that scalp sutures are removed in the outpatient setting Challenge EDW as possible versus discontinue Lasix to prevent fluid shifts Needs outpatient follow-up with cardiology for usual medical management Recommend TSH Chem-12 in about 3 to 4 weeks as is on amiodarone  Discharge Diagnoses:  MAIN problem for hospitalization   Simple syncope  Please see below for itemized issues addressed in HOpsital- refer to other progress notes for clarity if needed  Discharge Condition: Improved  Diet recommendation: Renal  Filed Weights   09/08/23 0232 09/08/23 0830 09/08/23 1230  Weight: 65.6 kg 65.6 kg 63 kg    History of present illness:  82 year old white female known history of endometrial cancer status post resection Dr. 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  Patient was admitted with syncope which did not seem to have any scary features-orthostatics were negative, patient ambulated with physical therapy and also ambulated upstairs-negotiated stairs well and requires some supervision She lives at a hotel at this time because her house is being renovated-she was given Ativan during hospital stay which might of clouded her mentation-from my perspective she seems quite stable for discharge I have canceled the echocardiogram as she had one recently 08/20/2023 which showed mild AS grade 2 diastolic dysfunction EF 60 to 08% and I would not expect this with progression Because she had a fall with a scalp laceration we will see if she can get into see her PCP From my perspective as long as she has follow-up for her superficial laceration to the head she could go home   Discharge Exam: Vitals:   09/08/23 1225 09/08/23 1230  BP: (!) 116/91 (!) 143/59  Pulse: 70 74  Resp: (!) 21 (!) 23  Temp:  98.2 F (36.8 C)  SpO2: 96% 96%    Subj on day of d/c   Awake somewhat coherent.  Talks tangentially at times but looks well  General Exam on discharge  EOMI NCAT flat affect Chest clear no added sound S1-S2 no murmur Abdomen soft no rebound ROM intact Power 5/5 grossly  Discharge Instructions    Allergies as of 09/08/2023       Reactions   Plum Pulp Anaphylaxis, Cough   Almond (diagnostic) Itching   Lemon Oil Other (See Comments)   Lemons cause coughing and respiratory issues    Other Itching   Dairy products/Soaps & detergents   Peanut-containing Drug Products Itching   Penicillins Rash  trauma and head injury. EXAM: CT HEAD WITHOUT CONTRAST CT CERVICAL SPINE WITHOUT CONTRAST TECHNIQUE: Multidetector CT imaging of the head and cervical spine was performed following the standard protocol without intravenous contrast. Multiplanar CT image reconstructions of the cervical spine were also generated. RADIATION DOSE REDUCTION: This exam was performed according to the departmental dose-optimization program which includes automated exposure control, adjustment of the mA and/or kV according to patient size and/or use of iterative reconstruction technique. COMPARISON:  None Available. FINDINGS: CT HEAD  FINDINGS Brain: No evidence of acute infarction, hemorrhage, hydrocephalus, extra-axial collection or mass lesion/mass effect. Vascular: No hyperdense vessel or unexpected calcification. Skull: No acute fracture.  Hyperostosis interna. Sinuses/Orbits: No visible injury CT CERVICAL SPINE FINDINGS Alignment: Normal. Skull base and vertebrae: No acute fracture Soft tissues and spinal canal: No prevertebral fluid or swelling. No visible canal hematoma. Left hemithyroidectomy. 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 Subjective osteopenia. Upper chest: No visible injury IMPRESSION: No evidence of acute intracranial or cervical spine injury. Electronically Signed   By: Tiburcio Pea M.D.   On: 09/07/2023 05:37   DG Chest Portable 1 View  Result Date: 09/07/2023 CLINICAL DATA:  Level 2 trauma due to fall EXAM: PORTABLE CHEST 1 VIEW COMPARISON:  06/22/2023 FINDINGS: Cardiomegaly and chronic small pleural effusions. No pulmonary edema or consolidation. No pneumothorax. Dialysis catheter with tip at the upper cavoatrial junction. Extensive artifact from EKG leads. IMPRESSION: Cardiomegaly and small pleural effusions, chronic when compared to priors. Electronically Signed   By: Tiburcio Pea M.D.   On: 09/07/2023 05:24   DG Pelvis Portable  Result Date: 09/07/2023 CLINICAL DATA:  Level 2 trauma due to fall. EXAM: PORTABLE PELVIS 1 VIEWS COMPARISON:  None Available. FINDINGS: Limited by underpenetration. No evidence of pelvic ring or hip fracture. Lower lumbar spine degeneration. IMPRESSION: No acute finding. Electronically Signed   By: Tiburcio Pea M.D.   On: 09/07/2023 05:23   ECHOCARDIOGRAM COMPLETE  Result Date: 08/20/2023    ECHOCARDIOGRAM REPORT   Patient Name:   Cassandra Baker Date of Exam: 08/20/2023 Medical Rec #:  [id number]      Height:       64.0 in Accession #:    [id number]     Weight:       151.5 lb Date of Birth:  [birth year]      BSA:          1.739 m Patient Age:     81 years       BP:           136/67 mmHg Patient Gender: F              HR:           77 bpm. 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 Chronic combined CHF, CAD and Previous                 Myocardial Infarction, ESRD; Risk Factors:Diabetes and                 Hypertension. Previous LVEF 35% and mild LVH.  Sonographer:    Chanetta Marshall BA, RDCS Referring Phys: [phone] TESSA N CONTE IMPRESSIONS  Physician Discharge Summary  Cassandra Baker BJY:[id number] DOB: 1941-07-16 DOA: 09/07/2023  PCP: Renford Dills, MD  Admit date: 09/07/2023 Discharge date: 09/08/2023  Time spent: 50 minutes  Recommendations for Outpatient Follow-up:  Please ensure that scalp sutures are removed in the outpatient setting Challenge EDW as possible versus discontinue Lasix to prevent fluid shifts Needs outpatient follow-up with cardiology for usual medical management Recommend TSH Chem-12 in about 3 to 4 weeks as is on amiodarone  Discharge Diagnoses:  MAIN problem for hospitalization   Simple syncope  Please see below for itemized issues addressed in HOpsital- refer to other progress notes for clarity if needed  Discharge Condition: Improved  Diet recommendation: Renal  Filed Weights   09/08/23 0232 09/08/23 0830 09/08/23 1230  Weight: 65.6 kg 65.6 kg 63 kg    History of present illness:  82 year old white female known history of endometrial cancer status post resection Dr. 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  Patient was admitted with syncope which did not seem to have any scary features-orthostatics were negative, patient ambulated with physical therapy and also ambulated upstairs-negotiated stairs well and requires some supervision She lives at a hotel at this time because her house is being renovated-she was given Ativan during hospital stay which might of clouded her mentation-from my perspective she seems quite stable for discharge I have canceled the echocardiogram as she had one recently 08/20/2023 which showed mild AS grade 2 diastolic dysfunction EF 60 to 08% and I would not expect this with progression Because she had a fall with a scalp laceration we will see if she can get into see her PCP From my perspective as long as she has follow-up for her superficial laceration to the head she could go home   Discharge Exam: Vitals:   09/08/23 1225 09/08/23 1230  BP: (!) 116/91 (!) 143/59  Pulse: 70 74  Resp: (!) 21 (!) 23  Temp:  98.2 F (36.8 C)  SpO2: 96% 96%    Subj on day of d/c   Awake somewhat coherent.  Talks tangentially at times but looks well  General Exam on discharge  EOMI NCAT flat affect Chest clear no added sound S1-S2 no murmur Abdomen soft no rebound ROM intact Power 5/5 grossly  Discharge Instructions    Allergies as of 09/08/2023       Reactions   Plum Pulp Anaphylaxis, Cough   Almond (diagnostic) Itching   Lemon Oil Other (See Comments)   Lemons cause coughing and respiratory issues    Other Itching   Dairy products/Soaps & detergents   Peanut-containing Drug Products Itching   Penicillins Rash

## 2023-09-08 NOTE — Care Management Obs Status (Signed)
MEDICARE OBSERVATION STATUS NOTIFICATION   Patient Details  Name: Cassandra Baker MRN: [id number] Date of Birth: 11/29/1941  Discussed with patient and sister in law [place] via phone . Left message for son . Roddie Mc reports son is in meetings all day  Medicare Observation Status Notification Given:  Yes    Kingsley Plan, RN 09/08/2023, 2:19 PM

## 2023-09-08 NOTE — Progress Notes (Signed)
Attempted Echocardiogram, Patient refused and wants to do it tomorrow.

## 2023-09-08 NOTE — Progress Notes (Signed)
   09/08/23 1230  Vitals  Temp 98.2 F (36.8 C)  Pulse Rate 74  Resp (!) 23  BP (!) 143/59  SpO2 96 %  O2 Device Room Air  Weight 63 kg  Type of Weight Post-Dialysis  Oxygen Therapy  Pulse Oximetry Type Continuous  Oximetry Probe Site Changed No  Post Treatment  Dialyzer Clearance Lightly streaked  Hemodialysis Intake (mL) 0 mL  Liters Processed 84  Fluid Removed (mL) 2500 mL  Tolerated HD Treatment Yes   Received patient in bed to unit.  Alert and oriented.  Informed consent signed and in chart.   TX duration:3.5  Patient tolerated well.  Transported back to the room  Alert, without acute distress.  Hand-off given to patient's nurse.   Access used: [facility] Access issues: no complications  Total UF removed: 2500 Medication(s) given: hectoral iv x 1---ativan 1mg  iv x 1 STAT cbc drawn and sent per order midtx  [facility]

## 2023-09-08 NOTE — Progress Notes (Signed)
D/C order noted. Contacted [facility] to advise clinic of pt's d/c today and that pt should resume care tomorrow.   Olivia Canter Renal Navigator [phone]

## 2023-09-09 ENCOUNTER — Ambulatory Visit
Admission: RE | Admit: 2023-09-09 | Discharge: 2023-09-09 | Disposition: A | Discharge status: Home / Self Care | Payer: Medicare Other | Source: Ambulatory Visit | Attending: Internal Medicine | Admitting: Internal Medicine

## 2023-09-09 ENCOUNTER — Telehealth: Payer: Self-pay | Admitting: Nephrology

## 2023-09-09 DIAGNOSIS — N958 Other specified menopausal and perimenopausal disorders: Secondary | ICD-10-CM | POA: Diagnosis not present

## 2023-09-09 DIAGNOSIS — M8588 Other specified disorders of bone density and structure, other site: Secondary | ICD-10-CM | POA: Diagnosis not present

## 2023-09-09 DIAGNOSIS — E2839 Other primary ovarian failure: Secondary | ICD-10-CM

## 2023-09-09 DIAGNOSIS — E349 Endocrine disorder, unspecified: Secondary | ICD-10-CM | POA: Diagnosis not present

## 2023-09-09 NOTE — Telephone Encounter (Signed)
Received communication from cardiology/Dr. Windell [facility] office recommending surgery to be postponed until January 2025, unless emergent because patient must be on DAPT for a minimum of 6 months.   Spoke with patient and updated on this recommendation. Patient would like to contact our office back towards the end of November or early December to schedule, preferably not a first case start.

## 2023-09-09 NOTE — Telephone Encounter (Signed)
Transition of Care - Initial Contact from Inpatient Facility  Date of discharge: 09/08/23 Date of contact: 09/09/23  Method: Phone Spoke to: Patient  Patient contacted to discuss transition of care from recent inpatient hospitalization. Patient was admitted to [facility] from 9/16- 09/08/23 with discharge diagnosis of syncope with fall   The discharge medication list was reviewed. Patient understands the changes and has no concerns.   Patient will return to his/her outpatient HD unit on: Friday 9/20.   No other concerns at this time.

## 2023-09-11 DIAGNOSIS — Z992 Dependence on renal dialysis: Secondary | ICD-10-CM | POA: Diagnosis not present

## 2023-09-11 DIAGNOSIS — N186 End stage renal disease: Secondary | ICD-10-CM | POA: Diagnosis not present

## 2023-09-11 DIAGNOSIS — N2581 Secondary hyperparathyroidism of renal origin: Secondary | ICD-10-CM | POA: Diagnosis not present

## 2023-09-12 ENCOUNTER — Emergency Department: Payer: Medicare Other

## 2023-09-12 ENCOUNTER — Other Ambulatory Visit: Payer: Self-pay

## 2023-09-12 ENCOUNTER — Emergency Department
Admission: EM | Admit: 2023-09-12 | Discharge: 2023-09-12 | Disposition: A | Discharge status: Home / Self Care | Payer: Medicare Other | Attending: Emergency Medicine | Admitting: Emergency Medicine

## 2023-09-12 ENCOUNTER — Encounter: Payer: Self-pay | Admitting: Emergency Medicine

## 2023-09-12 DIAGNOSIS — M25521 Pain in right elbow: Secondary | ICD-10-CM | POA: Diagnosis not present

## 2023-09-12 DIAGNOSIS — E1122 Type 2 diabetes mellitus with diabetic chronic kidney disease: Secondary | ICD-10-CM | POA: Insufficient documentation

## 2023-09-12 DIAGNOSIS — M47816 Spondylosis without myelopathy or radiculopathy, lumbar region: Secondary | ICD-10-CM | POA: Diagnosis not present

## 2023-09-12 DIAGNOSIS — N186 End stage renal disease: Secondary | ICD-10-CM | POA: Diagnosis not present

## 2023-09-12 DIAGNOSIS — I2510 Atherosclerotic heart disease of native coronary artery without angina pectoris: Secondary | ICD-10-CM | POA: Insufficient documentation

## 2023-09-12 DIAGNOSIS — S39012A Strain of muscle, fascia and tendon of lower back, initial encounter: Secondary | ICD-10-CM | POA: Insufficient documentation

## 2023-09-12 DIAGNOSIS — W19XXXA Unspecified fall, initial encounter: Secondary | ICD-10-CM

## 2023-09-12 DIAGNOSIS — M5136 Other intervertebral disc degeneration, lumbar region: Secondary | ICD-10-CM | POA: Diagnosis not present

## 2023-09-12 DIAGNOSIS — I132 Hypertensive heart and chronic kidney disease with heart failure and with stage 5 chronic kidney disease, or end stage renal disease: Secondary | ICD-10-CM | POA: Diagnosis not present

## 2023-09-12 DIAGNOSIS — S3992XA Unspecified injury of lower back, initial encounter: Secondary | ICD-10-CM | POA: Diagnosis present

## 2023-09-12 DIAGNOSIS — M545 Low back pain, unspecified: Secondary | ICD-10-CM | POA: Diagnosis not present

## 2023-09-12 DIAGNOSIS — M25421 Effusion, right elbow: Secondary | ICD-10-CM | POA: Diagnosis not present

## 2023-09-12 DIAGNOSIS — W06XXXA Fall from bed, initial encounter: Secondary | ICD-10-CM | POA: Diagnosis not present

## 2023-09-12 DIAGNOSIS — S59901A Unspecified injury of right elbow, initial encounter: Secondary | ICD-10-CM | POA: Diagnosis not present

## 2023-09-12 DIAGNOSIS — I509 Heart failure, unspecified: Secondary | ICD-10-CM | POA: Diagnosis not present

## 2023-09-12 MED ORDER — LIDOCAINE 5 % EX PTCH: 1.0000 | MEDICATED_PATCH | Freq: Two times a day (BID) | CUTANEOUS | 0 refills | Status: DC

## 2023-09-12 MED ORDER — ACETAMINOPHEN 325 MG PO TABS
650.0000 mg | ORAL_TABLET | Freq: Once | ORAL | Status: AC
  Administered 2023-09-12: 650 mg via ORAL
  Filled 2023-09-12: qty 2

## 2023-09-12 MED ORDER — LIDOCAINE 5 % EX PTCH
1.0000 | MEDICATED_PATCH | CUTANEOUS | Status: DC
  Administered 2023-09-12: 1 via TRANSDERMAL
  Filled 2023-09-12: qty 1

## 2023-09-12 NOTE — ED Notes (Signed)
Patient back to ER stretcher from Xrays

## 2023-09-12 NOTE — ED Triage Notes (Signed)
Pt presents ambulatory to triage via POV with complaints of lower back pain following a fall out of bed tonight. Pt denies hitting her head, no LOC. A&Ox4 at this time. Denies CP or SOB.

## 2023-09-12 NOTE — ED Notes (Signed)
Patient taken for Xrays at this time.

## 2023-09-12 NOTE — ED Provider Notes (Signed)
[facility] Provider Note    Event Date/Time   First MD Initiated Contact with Patient 09/12/23 0701     (approximate)   History   Chief Complaint Fall   HPI  Cassandra Baker is a 82 y.o. female with past medical history of hypertension, diabetes, CAD, CHF, and ESRD who presents to the ED complaining of fall.  Patient reports that she excellently rolled out of bed last night and became wedged between the wall and her bed.  She was unable to dislodge herself and eventually called EMS to help her.  She states she felt better once they were able to get her up, but they recommended she be checked out in the ED.  She denies hitting her head or losing consciousness, does complain of pain in the right side of her lower back as well as her right elbow.  She has been walking without difficulty since the fall and denies any headache, neck pain, chest pain, abdominal pain, or lower extremity pain.     Physical Exam   Triage Vital Signs: ED Triage Vitals  Encounter Vitals Group     BP 09/12/23 0533 (!) 147/57     Systolic BP Percentile --      Diastolic BP Percentile --      Pulse Rate 09/12/23 0533 80     Resp 09/12/23 0533 18     Temp 09/12/23 0533 98 F (36.7 C)     Temp src --      SpO2 09/12/23 0533 98 %     Weight 09/12/23 0535 141 lb 8.6 oz (64.2 kg)     Height 09/12/23 0535 5\' 4"  (1.626 m)     Head Circumference --      Peak Flow --      Pain Score 09/12/23 0531 10     Pain Loc --      Pain Education --      Exclude from Growth Chart --     Most recent vital signs: Vitals:   09/12/23 0533  BP: (!) 147/57  Pulse: 80  Resp: 18  Temp: 98 F (36.7 C)  SpO2: 98%    Constitutional: Alert and oriented. Eyes: Conjunctivae are normal. Head: Atraumatic. Nose: No congestion/rhinnorhea. Mouth/Throat: Mucous membranes are moist.  Neck: No midline cervical spine tenderness to palpation. Cardiovascular: Normal rate, regular rhythm. Grossly normal  heart sounds.  2+ radial pulses bilaterally. Respiratory: Normal respiratory effort.  No retractions. Lungs CTAB.  No chest wall tenderness to palpation. Gastrointestinal: Soft and nontender. No distention. Musculoskeletal: No lower extremity tenderness nor edema.  Right elbow tenderness to palpation with overlying abrasion, able to range right elbow without pain.  Right paraspinal lumbar tenderness to palpation. Neurologic:  Normal speech and language. No gross focal neurologic deficits are appreciated.    ED Results / Procedures / Treatments   Labs (all labs ordered are listed, but only abnormal results are displayed) Labs Reviewed - No data to display  RADIOLOGY Lumbar spine x-ray reviewed and interpreted by me with no fracture or dislocation.  Elbow x-ray reviewed and interpreted by me with no fracture or dislocation.  PROCEDURES:  Critical Care performed: No  Procedures   MEDICATIONS ORDERED IN ED: Medications  lidocaine (LIDODERM) 5 % 1 patch (has no administration in time range)  acetaminophen (TYLENOL) tablet 650 mg (650 mg Oral Given 09/12/23 8295)     IMPRESSION / MDM / ASSESSMENT AND PLAN / ED COURSE  I reviewed the triage  vital signs and the nursing notes.                              82 y.o. female with past medical history of hypertension, diabetes, CAD, CHF, and ESRD who presents to the ED complaining of fall with lower back pain and right elbow pain.  Patient's presentation is most consistent with acute complicated illness / injury requiring diagnostic workup.  Differential diagnosis includes, but is not limited to, lumbar strain, fracture, dislocation.  Patient well-appearing and in no acute distress, vital signs are unremarkable.  She has tenderness to palpation over her right paraspinal lumbar area but no midline tenderness.  Lumbar x-ray is negative for acute process, imaging of right elbow is also unremarkable.  No evidence of head or neck injury or  injury to her trunk, she is now ambulatory without difficulty.  We will place Lidoderm patch and have patient follow-up with her PCP, she was counseled to return to the ED for new or worsening symptoms.  Patient agrees with plan.      FINAL CLINICAL IMPRESSION(S) / ED DIAGNOSES   Final diagnoses:  Fall, initial encounter  Strain of lumbar region, initial encounter     Rx / DC Orders   ED Discharge Orders          Ordered    lidocaine (LIDODERM) 5 %  Every 12 hours        09/12/23 0726             Note:  This document was prepared using Dragon voice recognition software and may include unintentional dictation errors.   Chesley Noon, MD 09/12/23 0730

## 2023-09-16 DIAGNOSIS — N186 End stage renal disease: Secondary | ICD-10-CM | POA: Diagnosis not present

## 2023-09-16 DIAGNOSIS — Z992 Dependence on renal dialysis: Secondary | ICD-10-CM | POA: Diagnosis not present

## 2023-09-16 DIAGNOSIS — N2581 Secondary hyperparathyroidism of renal origin: Secondary | ICD-10-CM | POA: Diagnosis not present

## 2023-09-17 DIAGNOSIS — I2510 Atherosclerotic heart disease of native coronary artery without angina pectoris: Secondary | ICD-10-CM | POA: Diagnosis not present

## 2023-09-17 DIAGNOSIS — E039 Hypothyroidism, unspecified: Secondary | ICD-10-CM | POA: Diagnosis not present

## 2023-09-17 DIAGNOSIS — R55 Syncope and collapse: Secondary | ICD-10-CM | POA: Diagnosis not present

## 2023-09-17 DIAGNOSIS — F329 Major depressive disorder, single episode, unspecified: Secondary | ICD-10-CM | POA: Diagnosis not present

## 2023-09-17 DIAGNOSIS — E1122 Type 2 diabetes mellitus with diabetic chronic kidney disease: Secondary | ICD-10-CM | POA: Diagnosis not present

## 2023-09-22 DIAGNOSIS — E1122 Type 2 diabetes mellitus with diabetic chronic kidney disease: Secondary | ICD-10-CM | POA: Diagnosis not present

## 2023-09-22 DIAGNOSIS — Z992 Dependence on renal dialysis: Secondary | ICD-10-CM | POA: Diagnosis not present

## 2023-09-22 DIAGNOSIS — N186 End stage renal disease: Secondary | ICD-10-CM | POA: Diagnosis not present

## 2023-09-25 DIAGNOSIS — Z23 Encounter for immunization: Secondary | ICD-10-CM | POA: Diagnosis not present

## 2023-09-25 DIAGNOSIS — N186 End stage renal disease: Secondary | ICD-10-CM | POA: Diagnosis not present

## 2023-09-25 DIAGNOSIS — D631 Anemia in chronic kidney disease: Secondary | ICD-10-CM | POA: Diagnosis not present

## 2023-09-25 DIAGNOSIS — Z992 Dependence on renal dialysis: Secondary | ICD-10-CM | POA: Diagnosis not present

## 2023-09-25 DIAGNOSIS — N2581 Secondary hyperparathyroidism of renal origin: Secondary | ICD-10-CM | POA: Diagnosis not present

## 2023-09-28 DIAGNOSIS — R300 Dysuria: Secondary | ICD-10-CM | POA: Diagnosis not present

## 2023-09-30 DIAGNOSIS — D631 Anemia in chronic kidney disease: Secondary | ICD-10-CM | POA: Diagnosis not present

## 2023-09-30 DIAGNOSIS — N186 End stage renal disease: Secondary | ICD-10-CM | POA: Diagnosis not present

## 2023-09-30 DIAGNOSIS — Z992 Dependence on renal dialysis: Secondary | ICD-10-CM | POA: Diagnosis not present

## 2023-09-30 DIAGNOSIS — Z23 Encounter for immunization: Secondary | ICD-10-CM | POA: Diagnosis not present

## 2023-09-30 DIAGNOSIS — N2581 Secondary hyperparathyroidism of renal origin: Secondary | ICD-10-CM | POA: Diagnosis not present

## 2023-10-02 DIAGNOSIS — Z23 Encounter for immunization: Secondary | ICD-10-CM | POA: Diagnosis not present

## 2023-10-02 DIAGNOSIS — Z992 Dependence on renal dialysis: Secondary | ICD-10-CM | POA: Diagnosis not present

## 2023-10-02 DIAGNOSIS — N186 End stage renal disease: Secondary | ICD-10-CM | POA: Diagnosis not present

## 2023-10-02 DIAGNOSIS — N2581 Secondary hyperparathyroidism of renal origin: Secondary | ICD-10-CM | POA: Diagnosis not present

## 2023-10-02 DIAGNOSIS — D631 Anemia in chronic kidney disease: Secondary | ICD-10-CM | POA: Diagnosis not present

## 2023-10-05 DIAGNOSIS — Z23 Encounter for immunization: Secondary | ICD-10-CM | POA: Diagnosis not present

## 2023-10-05 DIAGNOSIS — E1122 Type 2 diabetes mellitus with diabetic chronic kidney disease: Secondary | ICD-10-CM | POA: Diagnosis not present

## 2023-10-05 DIAGNOSIS — N186 End stage renal disease: Secondary | ICD-10-CM | POA: Diagnosis not present

## 2023-10-05 DIAGNOSIS — Z992 Dependence on renal dialysis: Secondary | ICD-10-CM | POA: Diagnosis not present

## 2023-10-05 DIAGNOSIS — N2581 Secondary hyperparathyroidism of renal origin: Secondary | ICD-10-CM | POA: Diagnosis not present

## 2023-10-05 DIAGNOSIS — D631 Anemia in chronic kidney disease: Secondary | ICD-10-CM | POA: Diagnosis not present

## 2023-10-09 DIAGNOSIS — N186 End stage renal disease: Secondary | ICD-10-CM | POA: Diagnosis not present

## 2023-10-09 DIAGNOSIS — N2581 Secondary hyperparathyroidism of renal origin: Secondary | ICD-10-CM | POA: Diagnosis not present

## 2023-10-09 DIAGNOSIS — Z23 Encounter for immunization: Secondary | ICD-10-CM | POA: Diagnosis not present

## 2023-10-09 DIAGNOSIS — Z992 Dependence on renal dialysis: Secondary | ICD-10-CM | POA: Diagnosis not present

## 2023-10-09 DIAGNOSIS — D631 Anemia in chronic kidney disease: Secondary | ICD-10-CM | POA: Diagnosis not present

## 2023-10-12 NOTE — Progress Notes (Deleted)
Cardiology Office Note  Date:  10/12/2023   ID:  Cassandra, Baker [birth year], MRN [id number]  PCP:  Renford Dills, MD   No chief complaint on file.   HPI:  Cassandra Baker is a 82 y.o. female with  CAD with NSTEMI medically managed,  chronic combined systolic and diastolic CHF,  ESRD,  anemia of chronic disease,  DM2,  HTN,  hypothyroidism,  depression,  asthma and OA  Cardiomyopathy ejection fraction 30 to 35% Admitted to the hospital 06/15/2023 for CHF  for the evaluation of torsades de pointes and ventricular bigeminy.   Non-STEMI June 2024 managed medically in the setting of renal dysfunction Admitted to hospital June 11, 2019 for worsening fatigue, lower extremity edema, BNP 2300 underwent HD type cath placement with intiation of dilaysis 06/12/23. On the morning of 6/22 she developed torsades de pointes lasting 13.5 seconds followed by ventricular bigeminy. During th episode, the patient reported dizziness and blacking out.    underwent R/L heart cath 6/24. Right heart cath showed mildly elevated filing pressures, mild pulmonary hypertension and normal cardiac output with no hemodynamic evidence of significant mitral regurgitation. LHC showed severe 3V CAD, culprit being subtotal occlusion of the RCA which is severely calcified in the mid-segment as well as complex bifurcation disease at the PDA/posterior AV groove.    Was transferred to [facility] for consideration of CABG and EP consult Underwent lithotripsy and coronary stent intervention June 17, 2023 2 overlapping DES placed mid and proximal RCA VT noted prior to procedure, did not require defibrillation On amiodarone Transfused for anemia Started on hemodialysis June 23, 2023       Acute diastolic and systolic CHF Presenting with worsening symptoms over the past week Also with acute on chronic renal failure creatinine of 6 up from 4.2 -Reports improved leg swelling and abdominal distention, improved shortness of  breath with IV Lasix -Blood pressure improving -Not a candidate for ACE, ARB, ARNI given stage IV renal failure -Continue  IV Lasix twice daily, once euvolemic we will initiate low-dose beta-blocker,  -Jardiance on hold in the setting of renal failure   Cardiomyopathy Concern for ischemic etiology given regional wall motion abnormality on echo and concern for prior MI on EKG Continue aspirin statin heparin infusion Given renal dysfunction would avoid cardiac catheterization or cardiac CTA Tentatively scheduled for pharmacologic Myoview this morning   Non-STEMI Troponin 900  x2 in the setting of cardiomyopathy Regional wall motion abnormality on echo As detailed above, unclear if she would be a good candidate for catheterization given confusion and severe renal dysfunction Repeat troponin ordered, Myoview ordered   Diabetes type 2 with complications Jardiance held A1c 6.4   Acute on chronic renal failure Creatinine greater than 6, prior baseline around 4 Appears to be acute on chronic renal failure Continue IV Lasix given CHF symptoms with close monitoring of renal function Nephrology to see    Total encounter time more than 50 minutes  Greater than 50% was spent in counseling and coordination of care with the patient  PMH:   has a past medical history of Anemia, Arthritis, Asthma, Basal cell carcinoma (BCC), CHF (congestive heart failure) (HCC), CKD (chronic kidney disease), stage IV (HCC), Depression, Diabetes mellitus without complication (HCC), Dyspnea, Hypertension, and Hypothyroidism.  PSH:    Past Surgical History:  Procedure Laterality Date   APPENDECTOMY     4 th grade   AV FISTULA PLACEMENT Left 05/12/2019   Procedure: BRACHIOCEPHALIC ARTERIOVENOUS (AV) FISTULA CREATION LEFT ARM;  Surgeon: Nada Libman, MD;  Location: [facility] OR;  Service: Vascular;  Laterality: Left;   AV FISTULA PLACEMENT Left 06/22/2023   Procedure: LEFT BRACHIOBASILIC ARTERIOVENOUS (AV) FISTULA  CREATION;  Surgeon: Victorino Sparrow, MD;  Location: [facility] OR;  Service: Vascular;  Laterality: Left;   CORONARY STENT INTERVENTION N/A 06/17/2023   Procedure: CORONARY STENT INTERVENTION;  Surgeon: Iran Ouch, MD;  Location: [facility] INVASIVE CV LAB;  Service: Cardiovascular;  Laterality: N/A;   EYE SURGERY  2014   Bilateral 6 mos. apart  cataracts   FISTULA SUPERFICIALIZATION Left 10/06/2019   Procedure: ELEVATION OF ARTERIOVENOUS FISTULA LEFT ARM;  Surgeon: Nada Libman, MD;  Location: [facility] OR;  Service: Vascular;  Laterality: Left;   INSERTION OF DIALYSIS CATHETER N/A 06/22/2023   Procedure: INSERTION OF TUNNELED  DIALYSIS CATHETER USING 19CM PALINDROME CATHETER;  Surgeon: Victorino Sparrow, MD;  Location: [facility] OR;  Service: Vascular;  Laterality: N/A;   LYMPH NODE BIOPSY N/A 02/09/2018   Procedure: SENTINEL LYMPH NODE BIOPSY;  Surgeon: Adolphus Birchwood, MD;  Location: [facility] ORS;  Service: Gynecology;  Laterality: N/A;   PARATHYROIDECTOMY N/A 12/07/2014   Procedure: PARATHYROIDECTOMY;  Surgeon: Darnell Level, MD;  Location: [facility] ORS;  Service: General;  Laterality: N/A;   PERIPHERAL INTRAVASCULAR LITHOTRIPSY  06/17/2023   Procedure: PERIPHERAL INTRAVASCULAR LITHOTRIPSY;  Surgeon: Iran Ouch, MD;  Location: [facility] INVASIVE CV LAB;  Service: Cardiovascular;;   RIGHT/LEFT HEART CATH AND CORONARY ANGIOGRAPHY N/A 06/15/2023   Procedure: RIGHT/LEFT HEART CATH AND CORONARY ANGIOGRAPHY;  Surgeon: Iran Ouch, MD;  Location: [facility] INVASIVE CV LAB;  Service: Cardiovascular;  Laterality: N/A;   ROBOTIC ASSISTED TOTAL HYSTERECTOMY WITH BILATERAL SALPINGO OOPHERECTOMY Bilateral 02/09/2018   Procedure: XI ROBOTIC ASSISTED TOTAL HYSTERECTOMY WITH BILATERAL SALPINGO OOPHORECTOMY;  Surgeon: Adolphus Birchwood, MD;  Location: [facility] ORS;  Service: Gynecology;  Laterality: Bilateral;   TEMPORARY DIALYSIS CATHETER N/A 06/12/2023   Procedure: TEMPORARY DIALYSIS CATHETER;  Surgeon: Renford Dills, MD;  Location: [facility] INVASIVE CV LAB;   Service: Cardiovascular;  Laterality: N/A;   THYROID LOBECTOMY Left 12/07/2014   Procedure: LEFT THYROID LOBECTOMY;  Surgeon: Darnell Level, MD;  Location: [facility] ORS;  Service: General;  Laterality: Left;   TONSILLECTOMY     TUBAL LIGATION     Early thirties    Current Outpatient Medications  Medication Sig Dispense Refill   acetaminophen (TYLENOL) 500 MG tablet Take 500-1,000 mg by mouth every 6 (six) hours as needed (pain).      allopurinol (ZYLOPRIM) 100 MG tablet Take 100 mg by mouth daily as needed (For gout).     amiodarone (PACERONE) 200 MG tablet Take 1 tablet (200 mg total) by mouth daily. 90 tablet 3   aspirin EC 81 MG tablet Take 1 tablet (81 mg total) by mouth daily. Swallow whole. (Patient taking differently: Take 81 mg by mouth daily.) 30 tablet 12   clopidogrel (PLAVIX) 75 MG tablet Take 1 tablet (75 mg total) by mouth daily. 90 tablet 3   ferric citrate (AURYXIA) 1 GM 210 MG(Fe) tablet Take 210 mg by mouth 3 (three) times daily with meals.     FLUoxetine (PROZAC) 20 MG tablet Take 20 mg by mouth at bedtime.     furosemide (LASIX) 80 MG tablet Take 80 mg by mouth 2 (two) times daily.     levothyroxine (SYNTHROID) 75 MCG tablet Take 75 mcg by mouth daily before breakfast.     lidocaine (LIDODERM) 5 % Place 1 patch onto the skin every 12 (  twelve) hours. Remove & Discard patch within 12 hours or as directed by MD 10 patch 0   nitroGLYCERIN (NITROSTAT) 0.4 MG SL tablet Place 1 tablet (0.4 mg total) under the tongue every 5 (five) minutes x 3 doses as needed for chest pain. 25 tablet 3   sitaGLIPtin (JANUVIA) 25 MG tablet Take 25 mg by mouth daily.     sodium bicarbonate 650 MG tablet Take 650 mg by mouth 2 (two) times daily.     No current facility-administered medications for this visit.     Allergies:   Plum pulp, Almond (diagnostic), Lemon oil, Other, Peanut-containing drug products, and Penicillins   Social History:  The patient  reports that she has never smoked. She has  never used smokeless tobacco. She reports that she does not drink alcohol and does not use drugs.   Family History:   family history includes Cervical cancer in her maternal aunt; Heart disease in her brother, brother, father, and mother; Hypertension in her father and mother; Stroke in her brother and brother.    Review of Systems: ROS   PHYSICAL EXAM: VS:  LMP  (LMP Unknown)  , BMI There is no height or weight on file to calculate BMI. GEN: Well nourished, well developed, in no acute distress HEENT: normal Neck: no JVD, carotid bruits, or masses Cardiac: RRR; no murmurs, rubs, or gallops,no edema  Respiratory:  clear to auscultation bilaterally, normal work of breathing GI: soft, nontender, nondistended, + BS MS: no deformity or atrophy Skin: warm and dry, no rash Neuro:  Strength and sensation are intact Psych: euthymic mood, full affect    Recent Labs: 06/04/2023: TSH 2.976 06/16/2023: Magnesium 2.2 07/02/2023: B Natriuretic Peptide >4,500.0 09/07/2023: ALT 15; BUN 57; Creatinine, Ser 4.70; Hemoglobin 10.7; Platelets 105; Potassium 3.5; Sodium 141    Lipid Panel Lab Results  Component Value Date   CHOL 157 06/07/2023   HDL 61 06/07/2023   LDLCALC 70 06/07/2023   TRIG 129 06/07/2023      Wt Readings from Last 3 Encounters:  09/12/23 141 lb 8.6 oz (64.2 kg)  09/08/23 138 lb 14.2 oz (63 kg)  08/07/23 151 lb 8 oz (68.7 kg)       ASSESSMENT AND PLAN:  Problem List Items Addressed This Visit   None    Disposition:   F/U  12 months   Total encounter time more than 30 minutes  Greater than 50% was spent in counseling and coordination of care with the patient    Signed, Dossie Arbour, M.D., Ph.D. [facility] [place], [place] [phone]

## 2023-10-13 ENCOUNTER — Ambulatory Visit: Payer: Medicare Other | Admitting: Cardiovascular Disease

## 2023-10-13 DIAGNOSIS — I4729 Other ventricular tachycardia: Secondary | ICD-10-CM

## 2023-10-13 DIAGNOSIS — D638 Anemia in other chronic diseases classified elsewhere: Secondary | ICD-10-CM

## 2023-10-13 DIAGNOSIS — I10 Essential (primary) hypertension: Secondary | ICD-10-CM

## 2023-10-13 DIAGNOSIS — E7800 Pure hypercholesterolemia, unspecified: Secondary | ICD-10-CM

## 2023-10-13 DIAGNOSIS — I25118 Atherosclerotic heart disease of native coronary artery with other forms of angina pectoris: Secondary | ICD-10-CM

## 2023-10-13 DIAGNOSIS — I428 Other cardiomyopathies: Secondary | ICD-10-CM

## 2023-10-13 DIAGNOSIS — I5020 Unspecified systolic (congestive) heart failure: Secondary | ICD-10-CM

## 2023-10-13 DIAGNOSIS — N184 Chronic kidney disease, stage 4 (severe): Secondary | ICD-10-CM

## 2023-10-29 DIAGNOSIS — Z5321 Procedure and treatment not carried out due to patient leaving prior to being seen by health care provider: Secondary | ICD-10-CM | POA: Diagnosis not present

## 2023-11-01 DIAGNOSIS — I509 Heart failure, unspecified: Secondary | ICD-10-CM | POA: Diagnosis not present

## 2023-11-01 DIAGNOSIS — N186 End stage renal disease: Secondary | ICD-10-CM | POA: Diagnosis not present

## 2023-11-01 DIAGNOSIS — Z7982 Long term (current) use of aspirin: Secondary | ICD-10-CM | POA: Diagnosis not present

## 2023-11-01 DIAGNOSIS — U071 COVID-19: Secondary | ICD-10-CM | POA: Diagnosis not present

## 2023-11-01 DIAGNOSIS — J209 Acute bronchitis, unspecified: Secondary | ICD-10-CM | POA: Diagnosis not present

## 2023-11-01 DIAGNOSIS — Z992 Dependence on renal dialysis: Secondary | ICD-10-CM | POA: Diagnosis not present

## 2023-11-01 DIAGNOSIS — Z79899 Other long term (current) drug therapy: Secondary | ICD-10-CM | POA: Diagnosis not present

## 2023-11-01 DIAGNOSIS — R059 Cough, unspecified: Secondary | ICD-10-CM | POA: Diagnosis not present

## 2023-11-01 DIAGNOSIS — Z7902 Long term (current) use of antithrombotics/antiplatelets: Secondary | ICD-10-CM | POA: Diagnosis not present

## 2023-11-01 DIAGNOSIS — I132 Hypertensive heart and chronic kidney disease with heart failure and with stage 5 chronic kidney disease, or end stage renal disease: Secondary | ICD-10-CM | POA: Diagnosis not present

## 2023-11-04 DIAGNOSIS — N186 End stage renal disease: Secondary | ICD-10-CM | POA: Diagnosis not present

## 2023-11-04 DIAGNOSIS — Z992 Dependence on renal dialysis: Secondary | ICD-10-CM | POA: Diagnosis not present

## 2023-11-04 DIAGNOSIS — N2581 Secondary hyperparathyroidism of renal origin: Secondary | ICD-10-CM | POA: Diagnosis not present

## 2023-11-10 ENCOUNTER — Ambulatory Visit: Payer: Medicare Other | Admitting: Physician Assistant

## 2023-11-10 DIAGNOSIS — R52 Pain, unspecified: Secondary | ICD-10-CM | POA: Insufficient documentation

## 2023-11-10 DIAGNOSIS — R197 Diarrhea, unspecified: Secondary | ICD-10-CM | POA: Insufficient documentation

## 2023-11-10 NOTE — Progress Notes (Deleted)
Cardiology Office Note    Date:  11/10/2023   ID:  Cassandra Baker, DOB 1941-05-08, MRN [id number]  PCP:  Renford Dills, MD  Cardiologist:  Julien Nordmann, MD  Electrophysiologist:  None   Chief Complaint: ***  History of Present Illness:   Cassandra Baker is a 82 y.o. female with history of multivessel CAD status post complex PCI in 05/2023 as outlined below, chronic combined systolic and diastolic CHF, polymorphic VT, ESRD on HD, anemia of chronic disease previously requiring PRBC, DM2, HTN, HLD, hypothyroidism, asthma, OA, and depression who presents for follow-up of CAD, cardiomyopathy, and polymorphic VT.  She was admitted to the hospital in 05/2023 with NSTEMI and acute combined CHF complicated by CKD.  Echo showed an EF of 30 to 35% with global hypokinesis with severe hypokinesis of the inferior/posterior/septal wall, grade 1 diastolic dysfunction, normal RV systolic function, moderate mitral regurgitation with moderate mitral annular calcification, and an estimated right atrial pressure of 3 mmHg.  Due to underlying renal dysfunction cath was not pursued immediately.  She underwent a nuclear stress test that showed an EF of 31% and was overall high risk scan as there was evidence of a large infarct.  LHC was ultimately deferred due to poor renal function.  She was treated with 48 hours of IV heparin.  She ultimately left AMA.  She returned to the hospital later in 05/2023 with increasing lower extremity swelling and fatigue.  She was without symptoms of angina.  BNP was elevated at 2338.  She underwent temporary HD catheter with initiation of dialysis during the admission.  While admitted she developed torsades de points that lasted 13.5 seconds followed by ventricular bigeminy.  This was associated with dizziness and syncope.  She underwent LHC which showed severe three-vessel CAD with a culprit lesion felt to be a subtotal occlusion of the RCA which was severely calcified in the mid segment  as well as a complex bifurcation stenosis at the PDA/posterior AV groove.  She continued to have frequent PVCs and NSVT on telemetry.  She was started on amiodarone infusion and transferred to [facility] for evaluation by cardiothoracic surgery.  After evaluation by CVTS and she was not felt to be a candidate for CABG.  After discussion with family, it was decided to pursue complex PCI.  Prior to procedure she went into polymorphic VT that lasted for 10 seconds.  She converted spontaneously and did not require defibrillation.  On 06/17/2023 she underwent successful complex intravascular lithotripsy and 2 overlapping drug-eluting stents placed to the proximal/mid RCA.  The procedure was overall difficult due to heavy calcifications and difficulty crossing the stenosis in the mid RCA.  It was recommended to treat the LAD disease medically.    Echo on 08/20/2023 showed an EF of 60 to 65%, no regional wall motion normalities, mild LVH, grade 2 diastolic dysfunction, normal RV systolic function and ventricular cavity size, trivial mitral regurgitation, trivial aortic insufficiency, and mild aortic stenosis with a fixed left coronary cusp.  She was admitted to the hospital in 08/2023 for fall versus possible syncope.  CT head showed no acute intracranial abnormality.  Orthostatics negative.  Carotid artery ultrasound showed 1 to 39% bilateral ICA stenosis with bilateral antegrade flow of the vertebral arteries.  High-sensitivity troponin 53 with a delta troponin of 48.  Patient was evaluated by hospital service and deemed safe for discharge.  She was evaluated in the [facility] ED on 09/12/2023 after falling out of the bed, and denied  hitting her head or suffering LOC.  ***   Labs independently reviewed: 08/2023 - potassium 3.5, BUN 57, serum creatinine 4.7, Hgb 10.7, PLT 105 06/2023 - albumin 3.1 05/2023 - LP(a) 52.5, magnesium 2.2, AST/ALT normal, TC 157, TG 129, HDL 61, LDL 70, A1c 6.4, TSH normal  Past Medical  History:  Diagnosis Date   Anemia    Arthritis    low back,knees   Asthma    Basal cell carcinoma (BCC)    a. nose   CHF (congestive heart failure) (HCC)    a. prev eval by Dr. Katrinka Blazing in [place] - "many yrs ago."   CKD (chronic kidney disease), stage IV (HCC)    Depression    Diabetes mellitus without complication (HCC)    Dyspnea    Hypertension    Hypothyroidism     Past Surgical History:  Procedure Laterality Date   APPENDECTOMY     4 th grade   AV FISTULA PLACEMENT Left 05/12/2019   Procedure: BRACHIOCEPHALIC ARTERIOVENOUS (AV) FISTULA CREATION LEFT ARM;  Surgeon: Nada Libman, MD;  Location: [facility] OR;  Service: Vascular;  Laterality: Left;   AV FISTULA PLACEMENT Left 06/22/2023   Procedure: LEFT BRACHIOBASILIC ARTERIOVENOUS (AV) FISTULA CREATION;  Surgeon: Victorino Sparrow, MD;  Location: [facility] OR;  Service: Vascular;  Laterality: Left;   CORONARY STENT INTERVENTION N/A 06/17/2023   Procedure: CORONARY STENT INTERVENTION;  Surgeon: Iran Ouch, MD;  Location: [facility] INVASIVE CV LAB;  Service: Cardiovascular;  Laterality: N/A;   EYE SURGERY  2014   Bilateral 6 mos. apart  cataracts   FISTULA SUPERFICIALIZATION Left 10/06/2019   Procedure: ELEVATION OF ARTERIOVENOUS FISTULA LEFT ARM;  Surgeon: Nada Libman, MD;  Location: [facility] OR;  Service: Vascular;  Laterality: Left;   INSERTION OF DIALYSIS CATHETER N/A 06/22/2023   Procedure: INSERTION OF TUNNELED  DIALYSIS CATHETER USING 19CM PALINDROME CATHETER;  Surgeon: Victorino Sparrow, MD;  Location: [facility] OR;  Service: Vascular;  Laterality: N/A;   LYMPH NODE BIOPSY N/A 02/09/2018   Procedure: SENTINEL LYMPH NODE BIOPSY;  Surgeon: Adolphus Birchwood, MD;  Location: [facility] ORS;  Service: Gynecology;  Laterality: N/A;   PARATHYROIDECTOMY N/A 12/07/2014   Procedure: PARATHYROIDECTOMY;  Surgeon: Darnell Level, MD;  Location: [facility] ORS;  Service: General;  Laterality: N/A;   PERIPHERAL INTRAVASCULAR LITHOTRIPSY  06/17/2023   Procedure: PERIPHERAL INTRAVASCULAR  LITHOTRIPSY;  Surgeon: Iran Ouch, MD;  Location: [facility] INVASIVE CV LAB;  Service: Cardiovascular;;   RIGHT/LEFT HEART CATH AND CORONARY ANGIOGRAPHY N/A 06/15/2023   Procedure: RIGHT/LEFT HEART CATH AND CORONARY ANGIOGRAPHY;  Surgeon: Iran Ouch, MD;  Location: [facility] INVASIVE CV LAB;  Service: Cardiovascular;  Laterality: N/A;   ROBOTIC ASSISTED TOTAL HYSTERECTOMY WITH BILATERAL SALPINGO OOPHERECTOMY Bilateral 02/09/2018   Procedure: XI ROBOTIC ASSISTED TOTAL HYSTERECTOMY WITH BILATERAL SALPINGO OOPHORECTOMY;  Surgeon: Adolphus Birchwood, MD;  Location: [facility] ORS;  Service: Gynecology;  Laterality: Bilateral;   TEMPORARY DIALYSIS CATHETER N/A 06/12/2023   Procedure: TEMPORARY DIALYSIS CATHETER;  Surgeon: Renford Dills, MD;  Location: [facility] INVASIVE CV LAB;  Service: Cardiovascular;  Laterality: N/A;   THYROID LOBECTOMY Left 12/07/2014   Procedure: LEFT THYROID LOBECTOMY;  Surgeon: Darnell Level, MD;  Location: [facility] ORS;  Service: General;  Laterality: Left;   TONSILLECTOMY     TUBAL LIGATION     Early thirties    Current Medications: No outpatient medications have been marked as taking for the 11/10/23 encounter (Appointment) with Sondra Barges, PA-C.    Allergies:   Plum  pulp, Almond (diagnostic), Lemon oil, Other, Peanut-containing drug products, and Penicillins   Social History   Socioeconomic History   Marital status: Married    Spouse name: Not on file   Number of children: Not on file   Years of education: Not on file   Highest education level: Not on file  Occupational History   Not on file  Tobacco Use   Smoking status: Never   Smokeless tobacco: Never  Vaping Use   Vaping status: Never Used  Substance and Sexual Activity   Alcohol use: No   Drug use: No   Sexual activity: Not Currently    Birth control/protection: Post-menopausal  Other Topics Concern   Not on file  Social History Narrative   Lives in [place].  Does not routinely exercise.   Social Determinants of  Health   Financial Resource Strain: Not on file  Food Insecurity: No Food Insecurity (09/07/2023)   Hunger Vital Sign    Worried About Running Out of Food in the Last Year: Never true    Ran Out of Food in the Last Year: Never true  Transportation Needs: No Transportation Needs (09/07/2023)   PRAPARE - Administrator, Civil Service (Medical): No    Lack of Transportation (Non-Medical): No  Physical Activity: Not on file  Stress: Not on file  Social Connections: Not on file     Family History:  The patient's family history includes Cervical cancer in her maternal aunt; Heart disease in her brother, brother, father, and mother; Hypertension in her father and mother; Stroke in her brother and brother.  ROS:   12-point review of systems is negative unless otherwise noted in the HPI.   EKGs/Labs/Other Studies Reviewed:    Studies reviewed were summarized above. The additional studies were reviewed today:  Carotid artery ultrasound 09/08/2023: Summary:  Right Carotid: Velocities in the right ICA are consistent with a 1-39% stenosis.   Left Carotid: Velocities in the left ICA are consistent with a 1-39% stenosis.   Vertebrals: Bilateral vertebral arteries demonstrate antegrade flow.  __________  2D echo 08/20/2023: 1. Fixed left coronary cusp with mild AS.   2. Left ventricular ejection fraction, by estimation, is 60 to 65%. The  left ventricle has normal function. The left ventricle has no regional  wall motion abnormalities. There is mild left ventricular hypertrophy.  Left ventricular diastolic parameters  are consistent with Grade II diastolic dysfunction (pseudonormalization).   3. Right ventricular systolic function is normal. The right ventricular  size is normal.   4. The mitral valve is normal in structure. Trivial mitral valve  regurgitation. No evidence of mitral stenosis.   5. The aortic valve is calcified. Aortic valve regurgitation is trivial.  Mild  aortic valve stenosis.   6. The inferior vena cava is normal in size with greater than 50%  respiratory variability, suggesting right atrial pressure of 3 mmHg.  __________  LHC 06/17/2023:   Mid LAD lesion is 80% stenosed.   Mid Cx to Dist Cx lesion is 90% stenosed.   Ramus lesion is 60% stenosed.   RPDA lesion is 90% stenosed.   Prox RCA lesion is 80% stenosed.   Prox RCA to Mid RCA lesion is 99% stenosed.   Mid RCA lesion is 70% stenosed.   RPAV lesion is 60% stenosed.   A drug-eluting stent was successfully placed using a SYNERGY XD 3.0X48.   A drug-eluting stent was successfully placed using a SYNERGY XD 3.50X16.   Post intervention,  there is a 0% residual stenosis.   Post intervention, there is a 0% residual stenosis.   Post intervention, there is a 0% residual stenosis.   Moderately to severely elevated left ventricular end-diastolic pressure at 32 mmHg. 2. Successful complex intravascular lithotripsy and 2 overlapped drug-eluting stent placement to the mid and proximal right coronary artery.  Very difficult procedure overall due to heavy calcifications and difficulty crossing the stenosis in the mid right coronary artery.  The distal RCA bifurcation disease appeared better than before likely due to improved upstream flow and thus I elected not to treat. 3.  The patient did go into polymorphic ventricular tachycardia that lasted for about 10 seconds before starting the procedure.  She converted and did not require defibrillation. 4.  Difficulty sedating the patient with continuous involuntary movements of her lower extremities throughout the case.   Recommendations: Dual antiplatelet therapy for at least 12 months. I discontinued heparin drip. I switch amiodarone drip to oral. Recommend treating the LAD disease medically. __________  [facility] 06/15/2023:   Mid LAD lesion is 80% stenosed.   Mid Cx to Dist Cx lesion is 90% stenosed.   Ramus lesion is 60% stenosed.   Prox RCA to  Mid RCA lesion is 95% stenosed.   Mid RCA to Dist RCA lesion is 60% stenosed.   RPDA lesion is 90% stenosed.   RPAV lesion is 80% stenosed.   There is moderate to severe left ventricular systolic dysfunction.   LV end diastolic pressure is mildly elevated.   The left ventricular ejection fraction is 25-35% by visual estimate.   1.  Severe three-vessel coronary artery disease.  The culprit for myocardial infarction is likely subtotal occlusion of the right coronary artery which is heavily calcified and diffusely diseased in the proximal/mid segment.  In addition, there is complex bifurcation disease at the right PDA and right posterior AV groove artery. 2.  Mildly with severely reduced LV systolic function with an EF of 30% with severe inferior and apical hypokinesis. 3.  Right heart catheterization showed mildly elevated filling pressures, mild pulmonary hypertension and normal cardiac output.  No significant V waves noted on wedge tracing.   Recommendations: Difficult revascularization options.  The patient has surgical disease but she might not be a candidate for CABG.  The RCA is the culprit but it is diffusely diseased and heavily calcified.  Will discuss with patient and family and consider transfer to [facility] for evaluation.  If we decide to PCI, it is preferable to transfuse her to get her hemoglobin above 9 and load with an antiplatelet medication. __________  Eugenie Birks [facility] 06/05/2023: Pharmacological myocardial perfusion imaging study with no significant  ischemia Large fixed defect in the inferior and inferolateral wall consistent with prior MI Hypokinesis of the inferior and inferolateral wall, EF estimated at 31% EKG changes concerning for ischemia at peak stress/recovery (diffuse ST elevations in the inferior and anterolateral leads V3 through V6. CT attenuation correction images with mild diffuse aortic atherosclerosis, coronary calcification, predominantly in the RCA   High risk scan in the setting of large infarct __________  1. Left ventricular ejection fraction, by estimation, is 30 to 35%. The  left ventricle has moderately decreased function. The left ventricle  demonstrates mild global hypokinesis with severe hypokinesis of the  inferior/posterior/septal wall. There is  mild left ventricular hypertrophy. Left ventricular diastolic parameters  are consistent with Grade I diastolic dysfunction (impaired relaxation).   2. Right ventricular systolic function is normal. The right  ventricular  size is normal.   3. The mitral valve is normal in structure. Moderate mitral valve  regurgitation. No evidence of mitral stenosis. Moderate mitral annular  calcification.   4. The aortic valve is normal in structure. Aortic valve regurgitation is  not visualized. No aortic stenosis is present. Aortic valve mean gradient  measures 8.0 mmHg.   5. The inferior vena cava is normal in size with greater than 50%  respiratory variability, suggesting right atrial pressure of 3 mmHg.     EKG:  EKG is ordered today.  The EKG ordered today demonstrates ***  Recent Labs: 06/04/2023: TSH 2.976 06/16/2023: Magnesium 2.2 07/02/2023: B Natriuretic Peptide >4,500.0 09/07/2023: ALT 15; BUN 57; Creatinine, Ser 4.70; Hemoglobin 10.7; Platelets 105; Potassium 3.5; Sodium 141  Recent Lipid Panel    Component Value Date/Time   CHOL 157 06/07/2023 0458   TRIG 129 06/07/2023 0458   HDL 61 06/07/2023 0458   CHOLHDL 2.6 06/07/2023 0458   VLDL 26 06/07/2023 0458   LDLCALC 70 06/07/2023 0458    PHYSICAL EXAM:    VS:  LMP  (LMP Unknown)   BMI: There is no height or weight on file to calculate BMI.  Physical Exam  Wt Readings from Last 3 Encounters:  09/12/23 141 lb 8.6 oz (64.2 kg)  09/08/23 138 lb 14.2 oz (63 kg)  08/07/23 151 lb 8 oz (68.7 kg)     ASSESSMENT & PLAN:   Multivessel CAD involving the native coronary arteries without angina:  Chronic combined systolic  and diastolic CHF:  Polymorphic VT:  HTN: Blood pressure  HLD: LDL 70 in 05/2023.  ESRD on HD with anemia of chronic disease:   {Are you ordering a CV Procedure (e.g. stress test, cath, DCCV, TEE, etc)?   Press F2        :[id number]}     Disposition: F/u with Dr. Mariah Milling or an APP in ***.   Medication Adjustments/Labs and Tests Ordered: Current medicines are reviewed at length with the patient today.  Concerns regarding medicines are outlined above. Medication changes, Labs and Tests ordered today are summarized above and listed in the Patient Instructions accessible in Encounters.   Signed, Cassandra Listen, PA-C 11/10/2023 8:20 AM     [facility] [place] [address] [place], [place] [address] [phone]

## 2023-11-11 DIAGNOSIS — N2581 Secondary hyperparathyroidism of renal origin: Secondary | ICD-10-CM | POA: Diagnosis not present

## 2023-11-11 DIAGNOSIS — N186 End stage renal disease: Secondary | ICD-10-CM | POA: Diagnosis not present

## 2023-11-11 DIAGNOSIS — Z992 Dependence on renal dialysis: Secondary | ICD-10-CM | POA: Diagnosis not present

## 2023-11-17 DIAGNOSIS — E1122 Type 2 diabetes mellitus with diabetic chronic kidney disease: Secondary | ICD-10-CM | POA: Diagnosis not present

## 2023-11-17 DIAGNOSIS — D509 Iron deficiency anemia, unspecified: Secondary | ICD-10-CM | POA: Diagnosis not present

## 2023-11-17 DIAGNOSIS — N186 End stage renal disease: Secondary | ICD-10-CM | POA: Diagnosis not present

## 2023-11-17 DIAGNOSIS — N2581 Secondary hyperparathyroidism of renal origin: Secondary | ICD-10-CM | POA: Diagnosis not present

## 2023-11-17 DIAGNOSIS — Z992 Dependence on renal dialysis: Secondary | ICD-10-CM | POA: Diagnosis not present

## 2023-11-24 DIAGNOSIS — N2581 Secondary hyperparathyroidism of renal origin: Secondary | ICD-10-CM | POA: Diagnosis not present

## 2023-11-24 DIAGNOSIS — Z992 Dependence on renal dialysis: Secondary | ICD-10-CM | POA: Diagnosis not present

## 2023-11-24 DIAGNOSIS — N186 End stage renal disease: Secondary | ICD-10-CM | POA: Diagnosis not present

## 2023-11-24 DIAGNOSIS — D509 Iron deficiency anemia, unspecified: Secondary | ICD-10-CM | POA: Diagnosis not present

## 2023-11-24 DIAGNOSIS — D631 Anemia in chronic kidney disease: Secondary | ICD-10-CM | POA: Diagnosis not present

## 2023-11-26 ENCOUNTER — Ambulatory Visit: Payer: Medicare Other | Admitting: Physician Assistant

## 2023-11-26 NOTE — Progress Notes (Signed)
Cardiology Office Note    Date:  11/27/2023   ID:  Kamirra, [place] November 29, 1941, MRN [id number]  PCP:  Renford Dills, MD  Cardiologist:  Julien Nordmann, MD  Electrophysiologist:  None   Chief Complaint: Follow-up  History of Present Illness:   Cassandra Baker is a 82 y.o. female with history of multivessel CAD status post complex PCI in 05/2023 as outlined below, chronic combined systolic and diastolic CHF, polymorphic VT, ESRD on HD, anemia of chronic disease previously requiring PRBC, DM2, HTN, HLD, hypothyroidism, asthma, OA, and depression who presents for follow-up of CAD, cardiomyopathy, and polymorphic VT.  She was admitted to the hospital in 05/2023 with NSTEMI and acute combined CHF complicated by CKD.  Echo showed an EF of 30 to 35% with global hypokinesis with severe hypokinesis of the inferior/posterior/septal wall, grade 1 diastolic dysfunction, normal RV systolic function, moderate mitral regurgitation with moderate mitral annular calcification, and an estimated right atrial pressure of 3 mmHg.  Due to underlying renal dysfunction cath was not pursued immediately.  She underwent a nuclear stress test that showed an EF of 31% and was overall high risk scan as there was evidence of a large infarct.  LHC was ultimately deferred due to poor renal function.  She was treated with 48 hours of IV heparin.  She ultimately left AMA.  She returned to the hospital later in 05/2023 with increasing lower extremity swelling and fatigue.  She was without symptoms of angina.  BNP was elevated at 2338.  She underwent temporary HD catheter with initiation of dialysis during the admission.  While admitted she developed torsades de points that lasted 13.5 seconds followed by ventricular bigeminy.  This was associated with dizziness and syncope.  She underwent LHC which showed severe three-vessel CAD with a culprit lesion felt to be a subtotal occlusion of the RCA which was severely calcified in the mid  segment as well as a complex bifurcation stenosis at the PDA/posterior AV groove.  She continued to have frequent PVCs and NSVT on telemetry.  She was started on amiodarone infusion and transferred to [facility] for evaluation by cardiothoracic surgery.  After evaluation by CVTS and she was not felt to be a candidate for CABG.  After discussion with family, it was decided to pursue complex PCI.  Prior to procedure she went into polymorphic VT that lasted for 10 seconds.  She converted spontaneously and did not require defibrillation.  On 06/17/2023 she underwent successful complex intravascular lithotripsy and 2 overlapping drug-eluting stents placed to the proximal/mid RCA.  The procedure was overall difficult due to heavy calcifications and difficulty crossing the stenosis in the mid RCA.  It was recommended to treat the LAD disease medically.    Echo on 08/20/2023 showed an EF of 60 to 65%, no regional wall motion normalities, mild LVH, grade 2 diastolic dysfunction, normal RV systolic function and ventricular cavity size, trivial mitral regurgitation, trivial aortic insufficiency, and mild aortic stenosis with a fixed left coronary cusp.  She was admitted to the hospital in 08/2023 for fall versus possible syncope.  CT head showed no acute intracranial abnormality.  Orthostatics negative.  Carotid artery ultrasound showed 1 to 39% bilateral ICA stenosis with bilateral antegrade flow of the vertebral arteries.  High-sensitivity troponin 53 with a delta troponin of 48.  Patient was evaluated by hospital service and deemed safe for discharge.  She was evaluated in the [facility] ED on 09/12/2023 after falling out of the bed, and denied  hitting her head or suffering LOC.  She comes in doing well from a cardiac perspective and is without symptoms of angina or cardiac decompensation.  No palpitations, dizziness, presyncope, or syncope.  No further falls.  No hematochezia or melena.  Remains on hemodialysis on Tuesdays  and Thursdays.  No longer taking metoprolol.  Reports that she is currently taking amiodarone 200 mg daily, aspirin 81 mg daily, atorvastatin 80 mg daily, clopidogrel 75 mg daily, and furosemide 80 mg twice daily from a cardiac perspective.  No lower extremity swelling or progressive orthopnea.  Feels like she is doing well from a cardiac perspective.   Labs independently reviewed: 08/2023 - potassium 3.5, BUN 57, serum creatinine 4.7, Hgb 10.7, PLT 105 06/2023 - albumin 3.1 05/2023 - LP(a) 52.5, magnesium 2.2, AST/ALT normal, TC 157, TG 129, HDL 61, LDL 70, A1c 6.4, TSH normal  Past Medical History:  Diagnosis Date   Anemia    Arthritis    low back,knees   Asthma    Basal cell carcinoma (BCC)    a. nose   CHF (congestive heart failure) (HCC)    a. prev eval by Dr. Katrinka Blazing in [place] - "many yrs ago."   CKD (chronic kidney disease), stage IV (HCC)    Depression    Diabetes mellitus without complication (HCC)    Dyspnea    Hypertension    Hypothyroidism     Past Surgical History:  Procedure Laterality Date   APPENDECTOMY     4 th grade   AV FISTULA PLACEMENT Left 05/12/2019   Procedure: BRACHIOCEPHALIC ARTERIOVENOUS (AV) FISTULA CREATION LEFT ARM;  Surgeon: Nada Libman, MD;  Location: [facility] OR;  Service: Vascular;  Laterality: Left;   AV FISTULA PLACEMENT Left 06/22/2023   Procedure: LEFT BRACHIOBASILIC ARTERIOVENOUS (AV) FISTULA CREATION;  Surgeon: Victorino Sparrow, MD;  Location: [facility] OR;  Service: Vascular;  Laterality: Left;   CORONARY STENT INTERVENTION N/A 06/17/2023   Procedure: CORONARY STENT INTERVENTION;  Surgeon: Iran Ouch, MD;  Location: [facility] INVASIVE CV LAB;  Service: Cardiovascular;  Laterality: N/A;   EYE SURGERY  2014   Bilateral 6 mos. apart  cataracts   FISTULA SUPERFICIALIZATION Left 10/06/2019   Procedure: ELEVATION OF ARTERIOVENOUS FISTULA LEFT ARM;  Surgeon: Nada Libman, MD;  Location: [facility] OR;  Service: Vascular;  Laterality: Left;   INSERTION OF DIALYSIS  CATHETER N/A 06/22/2023   Procedure: INSERTION OF TUNNELED  DIALYSIS CATHETER USING 19CM PALINDROME CATHETER;  Surgeon: Victorino Sparrow, MD;  Location: [facility] OR;  Service: Vascular;  Laterality: N/A;   LYMPH NODE BIOPSY N/A 02/09/2018   Procedure: SENTINEL LYMPH NODE BIOPSY;  Surgeon: Adolphus Birchwood, MD;  Location: [facility] ORS;  Service: Gynecology;  Laterality: N/A;   PARATHYROIDECTOMY N/A 12/07/2014   Procedure: PARATHYROIDECTOMY;  Surgeon: Darnell Level, MD;  Location: [facility] ORS;  Service: General;  Laterality: N/A;   PERIPHERAL INTRAVASCULAR LITHOTRIPSY  06/17/2023   Procedure: PERIPHERAL INTRAVASCULAR LITHOTRIPSY;  Surgeon: Iran Ouch, MD;  Location: [facility] INVASIVE CV LAB;  Service: Cardiovascular;;   RIGHT/LEFT HEART CATH AND CORONARY ANGIOGRAPHY N/A 06/15/2023   Procedure: RIGHT/LEFT HEART CATH AND CORONARY ANGIOGRAPHY;  Surgeon: Iran Ouch, MD;  Location: [facility] INVASIVE CV LAB;  Service: Cardiovascular;  Laterality: N/A;   ROBOTIC ASSISTED TOTAL HYSTERECTOMY WITH BILATERAL SALPINGO OOPHERECTOMY Bilateral 02/09/2018   Procedure: XI ROBOTIC ASSISTED TOTAL HYSTERECTOMY WITH BILATERAL SALPINGO OOPHORECTOMY;  Surgeon: Adolphus Birchwood, MD;  Location: [facility] ORS;  Service: Gynecology;  Laterality: Bilateral;   TEMPORARY DIALYSIS CATHETER  N/A 06/12/2023   Procedure: TEMPORARY DIALYSIS CATHETER;  Surgeon: Renford Dills, MD;  Location: [facility] INVASIVE CV LAB;  Service: Cardiovascular;  Laterality: N/A;   THYROID LOBECTOMY Left 12/07/2014   Procedure: LEFT THYROID LOBECTOMY;  Surgeon: Darnell Level, MD;  Location: [facility] ORS;  Service: General;  Laterality: Left;   TONSILLECTOMY     TUBAL LIGATION     Early thirties    Current Medications: Current Meds  Medication Sig   acetaminophen (TYLENOL) 500 MG tablet Take 500-1,000 mg by mouth every 6 (six) hours as needed (pain).    allopurinol (ZYLOPRIM) 100 MG tablet Take 100 mg by mouth daily as needed (For gout).   amiodarone (PACERONE) 200 MG tablet Take 1 tablet (200  mg total) by mouth daily.   aspirin EC 81 MG tablet Take 1 tablet (81 mg total) by mouth daily. Swallow whole. (Patient taking differently: Take 81 mg by mouth daily.)   atorvastatin (LIPITOR) 80 MG tablet Take 1 tablet (80 mg total) by mouth daily.   clopidogrel (PLAVIX) 75 MG tablet Take 1 tablet (75 mg total) by mouth daily.   furosemide (LASIX) 80 MG tablet Take 80 mg by mouth 2 (two) times daily.   levothyroxine (SYNTHROID) 75 MCG tablet Take 75 mcg by mouth daily before breakfast.   metoprolol succinate (TOPROL XL) 25 MG 24 hr tablet Take 0.5 tablets (12.5 mg total) by mouth daily.   nitroGLYCERIN (NITROSTAT) 0.4 MG SL tablet Place 1 tablet (0.4 mg total) under the tongue every 5 (five) minutes x 3 doses as needed for chest pain.   sitaGLIPtin (JANUVIA) 25 MG tablet Take 25 mg by mouth daily.    Allergies:   Plum pulp, Almond (diagnostic), Lemon oil, Other, Peanut-containing drug products, and Penicillins   Social History   Socioeconomic History   Marital status: Married    Spouse name: Not on file   Number of children: Not on file   Years of education: Not on file   Highest education level: Not on file  Occupational History   Not on file  Tobacco Use   Smoking status: Never   Smokeless tobacco: Never  Vaping Use   Vaping status: Never Used  Substance and Sexual Activity   Alcohol use: No   Drug use: No   Sexual activity: Not Currently    Birth control/protection: Post-menopausal  Other Topics Concern   Not on file  Social History Narrative   Lives in [place].  Does not routinely exercise.   Social Determinants of Health   Financial Resource Strain: Not on file  Food Insecurity: No Food Insecurity (09/07/2023)   Hunger Vital Sign    Worried About Running Out of Food in the Last Year: Never true    Ran Out of Food in the Last Year: Never true  Transportation Needs: No Transportation Needs (09/07/2023)   PRAPARE - Administrator, Civil Service (Medical): No     Lack of Transportation (Non-Medical): No  Physical Activity: Not on file  Stress: Not on file  Social Connections: Not on file     Family History:  The patient's family history includes Cervical cancer in her maternal aunt; Heart disease in her brother, brother, father, and mother; Hypertension in her father and mother; Stroke in her brother and brother.  ROS:   12-point review of systems is negative unless otherwise noted in the HPI.   EKGs/Labs/Other Studies Reviewed:    Studies reviewed were summarized above. The additional studies were reviewed  today:  Carotid artery ultrasound 09/08/2023: Summary:  Right Carotid: Velocities in the right ICA are consistent with a 1-39% stenosis.   Left Carotid: Velocities in the left ICA are consistent with a 1-39% stenosis.   Vertebrals: Bilateral vertebral arteries demonstrate antegrade flow.  __________  2D echo 08/20/2023: 1. Fixed left coronary cusp with mild AS.   2. Left ventricular ejection fraction, by estimation, is 60 to 65%. The  left ventricle has normal function. The left ventricle has no regional  wall motion abnormalities. There is mild left ventricular hypertrophy.  Left ventricular diastolic parameters  are consistent with Grade II diastolic dysfunction (pseudonormalization).   3. Right ventricular systolic function is normal. The right ventricular  size is normal.   4. The mitral valve is normal in structure. Trivial mitral valve  regurgitation. No evidence of mitral stenosis.   5. The aortic valve is calcified. Aortic valve regurgitation is trivial.  Mild aortic valve stenosis.   6. The inferior vena cava is normal in size with greater than 50%  respiratory variability, suggesting right atrial pressure of 3 mmHg.  __________  LHC 06/17/2023:   Mid LAD lesion is 80% stenosed.   Mid Cx to Dist Cx lesion is 90% stenosed.   Ramus lesion is 60% stenosed.   RPDA lesion is 90% stenosed.   Prox RCA lesion is 80%  stenosed.   Prox RCA to Mid RCA lesion is 99% stenosed.   Mid RCA lesion is 70% stenosed.   RPAV lesion is 60% stenosed.   A drug-eluting stent was successfully placed using a SYNERGY XD 3.0X48.   A drug-eluting stent was successfully placed using a SYNERGY XD 3.50X16.   Post intervention, there is a 0% residual stenosis.   Post intervention, there is a 0% residual stenosis.   Post intervention, there is a 0% residual stenosis.   Moderately to severely elevated left ventricular end-diastolic pressure at 32 mmHg. 2. Successful complex intravascular lithotripsy and 2 overlapped drug-eluting stent placement to the mid and proximal right coronary artery.  Very difficult procedure overall due to heavy calcifications and difficulty crossing the stenosis in the mid right coronary artery.  The distal RCA bifurcation disease appeared better than before likely due to improved upstream flow and thus I elected not to treat. 3.  The patient did go into polymorphic ventricular tachycardia that lasted for about 10 seconds before starting the procedure.  She converted and did not require defibrillation. 4.  Difficulty sedating the patient with continuous involuntary movements of her lower extremities throughout the case.   Recommendations: Dual antiplatelet therapy for at least 12 months. I discontinued heparin drip. I switch amiodarone drip to oral. Recommend treating the LAD disease medically. __________  [facility] 06/15/2023:   Mid LAD lesion is 80% stenosed.   Mid Cx to Dist Cx lesion is 90% stenosed.   Ramus lesion is 60% stenosed.   Prox RCA to Mid RCA lesion is 95% stenosed.   Mid RCA to Dist RCA lesion is 60% stenosed.   RPDA lesion is 90% stenosed.   RPAV lesion is 80% stenosed.   There is moderate to severe left ventricular systolic dysfunction.   LV end diastolic pressure is mildly elevated.   The left ventricular ejection fraction is 25-35% by visual estimate.   1.  Severe three-vessel  coronary artery disease.  The culprit for myocardial infarction is likely subtotal occlusion of the right coronary artery which is heavily calcified and diffusely diseased in the proximal/mid segment.  In  addition, there is complex bifurcation disease at the right PDA and right posterior AV groove artery. 2.  Mildly with severely reduced LV systolic function with an EF of 30% with severe inferior and apical hypokinesis. 3.  Right heart catheterization showed mildly elevated filling pressures, mild pulmonary hypertension and normal cardiac output.  No significant V waves noted on wedge tracing.   Recommendations: Difficult revascularization options.  The patient has surgical disease but she might not be a candidate for CABG.  The RCA is the culprit but it is diffusely diseased and heavily calcified.  Will discuss with patient and family and consider transfer to [facility] for evaluation.  If we decide to PCI, it is preferable to transfuse her to get her hemoglobin above 9 and load with an antiplatelet medication. __________  Eugenie Birks [facility] 06/05/2023: Pharmacological myocardial perfusion imaging study with no significant  ischemia Large fixed defect in the inferior and inferolateral wall consistent with prior MI Hypokinesis of the inferior and inferolateral wall, EF estimated at 31% EKG changes concerning for ischemia at peak stress/recovery (diffuse ST elevations in the inferior and anterolateral leads V3 through V6. CT attenuation correction images with mild diffuse aortic atherosclerosis, coronary calcification, predominantly in the RCA  High risk scan in the setting of large infarct __________  1. Left ventricular ejection fraction, by estimation, is 30 to 35%. The  left ventricle has moderately decreased function. The left ventricle  demonstrates mild global hypokinesis with severe hypokinesis of the  inferior/posterior/septal wall. There is  mild left ventricular hypertrophy. Left  ventricular diastolic parameters  are consistent with Grade I diastolic dysfunction (impaired relaxation).   2. Right ventricular systolic function is normal. The right ventricular  size is normal.   3. The mitral valve is normal in structure. Moderate mitral valve  regurgitation. No evidence of mitral stenosis. Moderate mitral annular  calcification.   4. The aortic valve is normal in structure. Aortic valve regurgitation is  not visualized. No aortic stenosis is present. Aortic valve mean gradient  measures 8.0 mmHg.   5. The inferior vena cava is normal in size with greater than 50%  respiratory variability, suggesting right atrial pressure of 3 mmHg.     EKG:  EKG is not ordered today.    Recent Labs: 06/04/2023: TSH 2.976 06/16/2023: Magnesium 2.2 07/02/2023: B Natriuretic Peptide >4,500.0 09/07/2023: ALT 15; BUN 57; Creatinine, Ser 4.70; Hemoglobin 10.7; Platelets 105; Potassium 3.5; Sodium 141  Recent Lipid Panel    Component Value Date/Time   CHOL 157 06/07/2023 0458   TRIG 129 06/07/2023 0458   HDL 61 06/07/2023 0458   CHOLHDL 2.6 06/07/2023 0458   VLDL 26 06/07/2023 0458   LDLCALC 70 06/07/2023 0458    PHYSICAL EXAM:    VS:  BP (!) 163/82 (BP Location: Left Arm, Patient Position: Sitting, Cuff Size: Normal)   Pulse 64   Ht 5\' 4"  (1.626 m)   Wt 135 lb 9.6 oz (61.5 kg)   LMP  (LMP Unknown)   SpO2 98%   BMI 23.28 kg/m   BMI: Body mass index is 23.28 kg/m.  Physical Exam Vitals reviewed.  Constitutional:      Appearance: She is well-developed.  HENT:     Head: Normocephalic and atraumatic.  Eyes:     General:        Right eye: No discharge.        Left eye: No discharge.  Neck:     Vascular: No JVD.  Cardiovascular:  Rate and Rhythm: Normal rate and regular rhythm.     Heart sounds: S1 normal and S2 normal. Heart sounds not distant. No midsystolic click and no opening snap. Murmur heard.     Systolic murmur is present with a grade of 2/6 at the upper  right sternal border.     No friction rub.  Pulmonary:     Effort: Pulmonary effort is normal. No respiratory distress.     Breath sounds: Normal breath sounds. No decreased breath sounds, wheezing, rhonchi or rales.  Chest:     Chest wall: No tenderness.  Abdominal:     General: There is no distension.  Musculoskeletal:     Cervical back: Normal range of motion.     Right lower leg: No edema.     Left lower leg: No edema.  Skin:    General: Skin is warm and dry.     Nails: There is no clubbing.  Neurological:     Mental Status: She is alert and oriented to person, place, and time.  Psychiatric:        Speech: Speech normal.        Behavior: Behavior normal.        Thought Content: Thought content normal.        Judgment: Judgment normal.     Wt Readings from Last 3 Encounters:  11/27/23 135 lb 9.6 oz (61.5 kg)  09/12/23 141 lb 8.6 oz (64.2 kg)  09/08/23 138 lb 14.2 oz (63 kg)     ASSESSMENT & PLAN:   Multivessel CAD involving the native coronary arteries without angina: She is doing well and without symptoms concerning for angina or cardiac decompensation.  Continue aggressive risk factor modification and secondary prevention including aspirin 81 mg, clopidogrel 75 mg, atorvastatin 80 mg, and resumption of Toprol-XL 12.5 mg.  Would benefit from cardiac rehab.  No indication for further ischemic testing at this time.  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  NYHA class is difficult to assess secondary to sedentary lifestyle.  Volume management achieved through hemodialysis and furosemide.  Resume Toprol-XL 12.5 mg daily.  Not currently on ACE inhibitor, ARB, ARNI, MRA, or SGLT2 inhibitor secondary to advanced renal dysfunction.  She has had subsequent normalization of LV systolic function by echo in 07/2023.  Polymorphic VT: Denies any symptoms for recurrence.  Placed ZIO AT.  Remains on amiodarone 200 mg daily.  She declines labs today.  In follow-up, she will need  amiodarone surveillance labs.  Resume Toprol-XL 12.5 mg.  Aortic stenosis: Mild by echo in 07/2023.  Monitor periodically.  HTN: Blood pressure is mildly elevated in the office today.  She will require some degree of permissive hypertension with hemodialysis.  Resume Toprol-XL as outlined above.  HLD: LDL 70 in 05/2023.  Remains on atorvastatin 80 mg.  ESRD on HD with anemia of chronic disease: Management per nephrology.       Disposition: F/u with Dr. Mariah Milling or an APP in 3 months.   Medication Adjustments/Labs and Tests Ordered: Current medicines are reviewed at length with the patient today.  Concerns regarding medicines are outlined above. Medication changes, Labs and Tests ordered today are summarized above and listed in the Patient Instructions accessible in Encounters.   Signed, Eula Listen, PA-C 11/27/2023 4:51 PM     [facility] [address] [place], [place] [address] [phone]

## 2023-11-27 ENCOUNTER — Ambulatory Visit: Payer: Medicare Other | Attending: Physician Assistant | Admitting: Physician Assistant

## 2023-11-27 ENCOUNTER — Encounter: Payer: Self-pay | Admitting: Physician Assistant

## 2023-11-27 ENCOUNTER — Ambulatory Visit: Payer: Medicare Other

## 2023-11-27 VITALS — BP 163/82 | HR 64 | Ht 64.0 in | Wt 135.6 lb

## 2023-11-27 DIAGNOSIS — I10 Essential (primary) hypertension: Secondary | ICD-10-CM | POA: Insufficient documentation

## 2023-11-27 DIAGNOSIS — I2510 Atherosclerotic heart disease of native coronary artery without angina pectoris: Secondary | ICD-10-CM | POA: Insufficient documentation

## 2023-11-27 DIAGNOSIS — E785 Hyperlipidemia, unspecified: Secondary | ICD-10-CM | POA: Insufficient documentation

## 2023-11-27 DIAGNOSIS — N186 End stage renal disease: Secondary | ICD-10-CM | POA: Diagnosis not present

## 2023-11-27 DIAGNOSIS — I350 Nonrheumatic aortic (valve) stenosis: Secondary | ICD-10-CM | POA: Insufficient documentation

## 2023-11-27 DIAGNOSIS — R55 Syncope and collapse: Secondary | ICD-10-CM | POA: Insufficient documentation

## 2023-11-27 DIAGNOSIS — I4729 Other ventricular tachycardia: Secondary | ICD-10-CM | POA: Diagnosis not present

## 2023-11-27 DIAGNOSIS — Z992 Dependence on renal dialysis: Secondary | ICD-10-CM | POA: Diagnosis not present

## 2023-11-27 DIAGNOSIS — I5032 Chronic diastolic (congestive) heart failure: Secondary | ICD-10-CM | POA: Diagnosis not present

## 2023-11-27 DIAGNOSIS — D638 Anemia in other chronic diseases classified elsewhere: Secondary | ICD-10-CM | POA: Insufficient documentation

## 2023-11-27 MED ORDER — ATORVASTATIN CALCIUM 80 MG PO TABS
80.0000 mg | ORAL_TABLET | Freq: Every day | ORAL | Status: DC
Start: 2023-11-27 — End: 2024-03-01

## 2023-11-27 MED ORDER — METOPROLOL SUCCINATE ER 25 MG PO TB24: 12.5000 mg | ORAL_TABLET | Freq: Every day | ORAL | 3 refills | Status: DC

## 2023-11-27 NOTE — Patient Instructions (Signed)
Medication Instructions:  Your physician recommends the following medication changes.  START TAKING: Toprol 12.5 mg daily  *If you need a refill on your cardiac medications before your next appointment, please call your pharmacy*   Lab Work: None ordered at this time    Testing/Procedures: ZIO AT Long term monitor-Live Telemetry  Your physician has requested you wear a ZIO patch monitor for 14 days.  This is a single patch monitor. [employer] supplies one patch monitor per enrollment. Additional stickers are not available.  Please do not apply patch if you will be having a Nuclear Stress Test, Echocardiogram, Cardiac CT, MRI, or Chest Xray during the period you would be wearing the monitor. The patch cannot be worn during these tests. You cannot remove and re-apply the ZIO AT patch monitor.  Your ZIO patch monitor will be mailed 3 day USPS to your address on file. It may take 3-5 days to receive your monitor after you have been enrolled.  Once you have received your monitor, please review the enclosed instructions. Your monitor has already been registered assigning a specific monitor serial # to you.   Billing and Patient Assistance Program information  Meredeth Ide has been supplied with any insurance information on record for billing. [employer] offers a sliding scale Patient Assistance Program for patients without insurance, or whose  insurance does not completely cover the cost of the ZIO patch monitor. You must apply for the Patient Assistance Program to qualify for the discounted rate. To apply, call [employer] at [phone], select option 4, select option 2 , ask to apply for the Patient Assistance Program, (you can request an interpreter if needed). [employer] will ask your household income and how many people are in your household. [employer] will quote your out-of-pocket cost based on this information. They will also be able to set up a 12 month interest free payment plan if needed.  Applying the  monitor   Shave hair from upper left chest.  Hold the abrader disc by orange tab. Rub the abrader in 40 strokes over left upper chest as indicated in  your monitor instructions.  Clean area with 4 enclosed alcohol pads. Use all pads to ensure the area is cleaned thoroughly. Let dry.  Apply patch as indicated in monitor instructions. Patch will be placed under collarbone on left side of chest with arrow pointing upward.  Rub patch adhesive wings for 2 minutes. Remove the white label marked "1". Remove the white label marked "2". Rub patch adhesive wings for 2 additional minutes.  While looking in a mirror, press and release button in center of patch. A small green light will flash 3-4 times. This will be your only indicator that the monitor has been turned on.  Do not shower for the first 24 hours. You may shower after the first 24 hours.  Press the button if you feel a symptom. You will hear a small click. Record Date, Time and Symptom in the Patient Log.   Starting the Gateway  In your kit there is a Audiological scientist box the size of a cellphone. This is Buyer, retail. It transmits all your recorded data to [facility]. This box must always stay within 10 feet of you. Open the box and push the * button. There will be a light that blinks orange and then green a few times. When the light stops blinking, the Gateway is connected to the ZIO patch. Call [employer] at [phone] to confirm your monitor is transmitting.  Returning your monitor  Remove your patch and place it inside the Gateway. In the lower half of the Gateway there is a white bag with prepaid postage on it. Place Gateway in bag and seal. Mail package back to [place] as soon as possible. Your physician should have your final report approximately 7 days after you have mailed back your monitor. Call [facility] Customer Care at [phone] if you have questions regarding your ZIO AT patch monitor. Call them immediately if you see an  orange light blinking on your monitor.  If your monitor falls off in less than 4 days, contact our Monitor department at [phone]. If your monitor becomes loose or falls off after 4 days call [employer] at [phone] for suggestions on securing your monitor    Follow-Up: At [facility], you and your health needs are our priority.  As part of our continuing mission to provide you with exceptional heart care, we have created designated Provider Care Teams.  These Care Teams include your primary Cardiologist (physician) and Advanced Practice Providers (APPs -  Physician Assistants and Nurse Practitioners) who all work together to provide you with the care you need, when you need it.  We recommend signing up for the patient portal called "MyChart".  Sign up information is provided on this After Visit Summary.  MyChart is used to connect with patients for Virtual Visits (Telemedicine).  Patients are able to view lab/test results, encounter notes, upcoming appointments, etc.  Non-urgent messages can be sent to your provider as well.   To learn more about what you can do with MyChart, go to [email or web address].    Your next appointment:   3 month(s)  Provider:   You may see Lorine Bears, MD to RE-establish care - he wants to see you post catheterization

## 2023-12-01 DIAGNOSIS — N2581 Secondary hyperparathyroidism of renal origin: Secondary | ICD-10-CM | POA: Diagnosis not present

## 2023-12-01 DIAGNOSIS — D631 Anemia in chronic kidney disease: Secondary | ICD-10-CM | POA: Diagnosis not present

## 2023-12-01 DIAGNOSIS — N186 End stage renal disease: Secondary | ICD-10-CM | POA: Diagnosis not present

## 2023-12-01 DIAGNOSIS — Z992 Dependence on renal dialysis: Secondary | ICD-10-CM | POA: Diagnosis not present

## 2023-12-01 DIAGNOSIS — D509 Iron deficiency anemia, unspecified: Secondary | ICD-10-CM | POA: Diagnosis not present

## 2023-12-08 DIAGNOSIS — N2581 Secondary hyperparathyroidism of renal origin: Secondary | ICD-10-CM | POA: Diagnosis not present

## 2023-12-08 DIAGNOSIS — N186 End stage renal disease: Secondary | ICD-10-CM | POA: Diagnosis not present

## 2023-12-08 DIAGNOSIS — Z992 Dependence on renal dialysis: Secondary | ICD-10-CM | POA: Diagnosis not present

## 2023-12-08 DIAGNOSIS — D509 Iron deficiency anemia, unspecified: Secondary | ICD-10-CM | POA: Diagnosis not present

## 2023-12-08 DIAGNOSIS — D631 Anemia in chronic kidney disease: Secondary | ICD-10-CM | POA: Diagnosis not present

## 2023-12-09 DIAGNOSIS — R0602 Shortness of breath: Secondary | ICD-10-CM | POA: Diagnosis not present

## 2023-12-09 DIAGNOSIS — R451 Restlessness and agitation: Secondary | ICD-10-CM | POA: Diagnosis not present

## 2023-12-09 DIAGNOSIS — R001 Bradycardia, unspecified: Secondary | ICD-10-CM | POA: Diagnosis not present

## 2023-12-09 DIAGNOSIS — I10 Essential (primary) hypertension: Secondary | ICD-10-CM | POA: Diagnosis not present

## 2023-12-09 DIAGNOSIS — R079 Chest pain, unspecified: Secondary | ICD-10-CM | POA: Diagnosis not present

## 2023-12-11 DIAGNOSIS — R55 Syncope and collapse: Secondary | ICD-10-CM | POA: Diagnosis not present

## 2023-12-12 DIAGNOSIS — R55 Syncope and collapse: Secondary | ICD-10-CM | POA: Diagnosis not present

## 2023-12-21 ENCOUNTER — Telehealth: Payer: Self-pay | Admitting: Physician Assistant

## 2023-12-21 NOTE — Telephone Encounter (Signed)
Spoke with patient and she wanted to review how long to wear it and how to make sure it is working. Answered all of her questions and she had no further needs.

## 2023-12-21 NOTE — Telephone Encounter (Signed)
Pt daughter called in to confirm if pt is to wear monitor for 14 day or 21 days

## 2023-12-25 DIAGNOSIS — N186 End stage renal disease: Secondary | ICD-10-CM | POA: Diagnosis not present

## 2023-12-25 DIAGNOSIS — E1122 Type 2 diabetes mellitus with diabetic chronic kidney disease: Secondary | ICD-10-CM | POA: Diagnosis not present

## 2023-12-25 DIAGNOSIS — I10 Essential (primary) hypertension: Secondary | ICD-10-CM | POA: Diagnosis not present

## 2023-12-25 DIAGNOSIS — Z992 Dependence on renal dialysis: Secondary | ICD-10-CM | POA: Diagnosis not present

## 2023-12-25 DIAGNOSIS — I120 Hypertensive chronic kidney disease with stage 5 chronic kidney disease or end stage renal disease: Secondary | ICD-10-CM | POA: Diagnosis not present

## 2023-12-29 DIAGNOSIS — Z992 Dependence on renal dialysis: Secondary | ICD-10-CM | POA: Diagnosis not present

## 2023-12-29 DIAGNOSIS — D509 Iron deficiency anemia, unspecified: Secondary | ICD-10-CM | POA: Diagnosis not present

## 2023-12-29 DIAGNOSIS — N186 End stage renal disease: Secondary | ICD-10-CM | POA: Diagnosis not present

## 2023-12-29 DIAGNOSIS — N2581 Secondary hyperparathyroidism of renal origin: Secondary | ICD-10-CM | POA: Diagnosis not present

## 2024-01-11 DIAGNOSIS — N186 End stage renal disease: Secondary | ICD-10-CM | POA: Diagnosis not present

## 2024-01-11 DIAGNOSIS — R0602 Shortness of breath: Secondary | ICD-10-CM | POA: Diagnosis not present

## 2024-01-11 DIAGNOSIS — I959 Hypotension, unspecified: Secondary | ICD-10-CM | POA: Diagnosis not present

## 2024-01-11 DIAGNOSIS — R9431 Abnormal electrocardiogram [ECG] [EKG]: Secondary | ICD-10-CM | POA: Diagnosis not present

## 2024-01-11 DIAGNOSIS — I510 Cardiac septal defect, acquired: Secondary | ICD-10-CM | POA: Diagnosis not present

## 2024-01-11 DIAGNOSIS — Z992 Dependence on renal dialysis: Secondary | ICD-10-CM | POA: Diagnosis not present

## 2024-01-11 DIAGNOSIS — R079 Chest pain, unspecified: Secondary | ICD-10-CM | POA: Diagnosis not present

## 2024-01-11 DIAGNOSIS — R609 Edema, unspecified: Secondary | ICD-10-CM | POA: Diagnosis not present

## 2024-01-12 ENCOUNTER — Telehealth: Payer: Self-pay | Admitting: Physician Assistant

## 2024-01-12 NOTE — Telephone Encounter (Signed)
Patient notified of the following results from Eula Listen, PA-C.  Heart monitor showed a predominant rhythm of sinus with rare extra beats from the top and bottom portions of the heart.  No evidence of arrhythmia.  Patient triggered events were associated with sinus rhythm.  Reassuring study.   Patient verbalizes understanding.

## 2024-01-12 NOTE — Telephone Encounter (Signed)
Follow Up:     Patient is retuning a call, concerning her Monitor results.

## 2024-01-14 ENCOUNTER — Emergency Department (HOSPITAL_COMMUNITY): Payer: Medicare Other

## 2024-01-14 ENCOUNTER — Other Ambulatory Visit: Payer: Self-pay

## 2024-01-14 ENCOUNTER — Emergency Department (HOSPITAL_COMMUNITY)
Admission: EM | Admit: 2024-01-14 | Discharge: 2024-01-15 | Disposition: A | Discharge status: Home / Self Care | Payer: Medicare Other | Attending: Emergency Medicine | Admitting: Emergency Medicine

## 2024-01-14 DIAGNOSIS — Z7902 Long term (current) use of antithrombotics/antiplatelets: Secondary | ICD-10-CM | POA: Insufficient documentation

## 2024-01-14 DIAGNOSIS — Z452 Encounter for adjustment and management of vascular access device: Secondary | ICD-10-CM | POA: Diagnosis not present

## 2024-01-14 DIAGNOSIS — N186 End stage renal disease: Secondary | ICD-10-CM | POA: Insufficient documentation

## 2024-01-14 DIAGNOSIS — E1122 Type 2 diabetes mellitus with diabetic chronic kidney disease: Secondary | ICD-10-CM | POA: Diagnosis not present

## 2024-01-14 DIAGNOSIS — Z79899 Other long term (current) drug therapy: Secondary | ICD-10-CM | POA: Insufficient documentation

## 2024-01-14 DIAGNOSIS — J90 Pleural effusion, not elsewhere classified: Secondary | ICD-10-CM | POA: Diagnosis not present

## 2024-01-14 DIAGNOSIS — Z91010 Allergy to peanuts: Secondary | ICD-10-CM | POA: Insufficient documentation

## 2024-01-14 DIAGNOSIS — I10 Essential (primary) hypertension: Secondary | ICD-10-CM | POA: Diagnosis not present

## 2024-01-14 DIAGNOSIS — I2510 Atherosclerotic heart disease of native coronary artery without angina pectoris: Secondary | ICD-10-CM | POA: Insufficient documentation

## 2024-01-14 DIAGNOSIS — Z992 Dependence on renal dialysis: Secondary | ICD-10-CM | POA: Diagnosis not present

## 2024-01-14 DIAGNOSIS — I120 Hypertensive chronic kidney disease with stage 5 chronic kidney disease or end stage renal disease: Secondary | ICD-10-CM | POA: Insufficient documentation

## 2024-01-14 DIAGNOSIS — Z7982 Long term (current) use of aspirin: Secondary | ICD-10-CM | POA: Insufficient documentation

## 2024-01-14 DIAGNOSIS — N3000 Acute cystitis without hematuria: Secondary | ICD-10-CM | POA: Diagnosis not present

## 2024-01-14 DIAGNOSIS — R3912 Poor urinary stream: Secondary | ICD-10-CM | POA: Diagnosis present

## 2024-01-14 DIAGNOSIS — R609 Edema, unspecified: Secondary | ICD-10-CM | POA: Diagnosis not present

## 2024-01-14 DIAGNOSIS — R531 Weakness: Secondary | ICD-10-CM | POA: Diagnosis not present

## 2024-01-14 DIAGNOSIS — R069 Unspecified abnormalities of breathing: Secondary | ICD-10-CM | POA: Diagnosis not present

## 2024-01-14 DIAGNOSIS — R0602 Shortness of breath: Secondary | ICD-10-CM | POA: Diagnosis not present

## 2024-01-14 LAB — CBC WITH DIFFERENTIAL/PLATELET
Abs Immature Granulocytes: 0.03 10*3/uL (ref 0.00–0.07)
Basophils Absolute: 0 10*3/uL (ref 0.0–0.1)
Basophils Relative: 1 %
Eosinophils Absolute: 0.1 10*3/uL (ref 0.0–0.5)
Eosinophils Relative: 1 %
HCT: 38.6 % (ref 36.0–46.0)
Hemoglobin: 12.1 g/dL (ref 12.0–15.0)
Immature Granulocytes: 1 %
Lymphocytes Relative: 8 %
Lymphs Abs: 0.4 10*3/uL — ABNORMAL LOW (ref 0.7–4.0)
MCH: 30.6 pg (ref 26.0–34.0)
MCHC: 31.3 g/dL (ref 30.0–36.0)
MCV: 97.7 fL (ref 80.0–100.0)
Monocytes Absolute: 0.3 10*3/uL (ref 0.1–1.0)
Monocytes Relative: 6 %
Neutro Abs: 4.5 10*3/uL (ref 1.7–7.7)
Neutrophils Relative %: 83 %
Platelets: 159 10*3/uL (ref 150–400)
RBC: 3.95 MIL/uL (ref 3.87–5.11)
RDW: 14.5 % (ref 11.5–15.5)
WBC: 5.4 10*3/uL (ref 4.0–10.5)
nRBC: 0 % (ref 0.0–0.2)

## 2024-01-14 LAB — URINALYSIS, ROUTINE W REFLEX MICROSCOPIC
Bilirubin Urine: NEGATIVE
Glucose, UA: NEGATIVE mg/dL
Ketones, ur: NEGATIVE mg/dL
Nitrite: NEGATIVE
Protein, ur: 100 mg/dL — AB
Specific Gravity, Urine: 1.011 (ref 1.005–1.030)
WBC, UA: >50 WBC/hpf (ref 0–5)
pH: 5 (ref 5.0–8.0)

## 2024-01-14 MED ORDER — SODIUM CHLORIDE 0.9 % IV SOLN
1.0000 g | Freq: Once | INTRAVENOUS | Status: AC
  Administered 2024-01-14: 1 g via INTRAVENOUS
  Filled 2024-01-14: qty 10

## 2024-01-14 NOTE — ED Notes (Signed)
Pt on bedside commode.

## 2024-01-14 NOTE — ED Notes (Signed)
Pt urinated ~138ml in bedside commode.

## 2024-01-14 NOTE — ED Notes (Signed)
Pt back in bed.

## 2024-01-14 NOTE — ED Triage Notes (Signed)
Pt bib gcems from home. C/o sob on and off for two weeks. Pt on dialysis t/th/sat.  Pt has not gone since the 14 th of jan. Pt also c/o weakness and sob with ambulation and supine.  98% ra  Cbg 157 Bp 152/70 60 hr

## 2024-01-14 NOTE — ED Provider Notes (Signed)
[place] EMERGENCY DEPARTMENT AT [facility] Provider Note   CSN: [id number] Arrival date & time: 01/14/24  2128     History  Chief Complaint  Patient presents with   Shortness of Breath    Cassandra Baker is a 83 y.o. female.  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  Reports that she last went to dialysis on the 14th of this month.  Says that since then she has had decreased urination.  Also has had increased shortness of breath.  No increased lower extremity swelling.  No bleeding.  No confusion.  No muscle cramps.       Home Medications Prior to Admission medications   Medication Sig Start Date End Date Taking? Authorizing Provider  cefadroxil (DURICEF) 500 MG capsule Take 1 capsule (500 mg total) by mouth 2 (two) times daily for 7 days. 01/15/24 01/22/24 Yes Kommor, [place], MD  acetaminophen (TYLENOL) 500 MG tablet Take 500-1,000 mg by mouth every 6 (six) hours as needed (pain).     [provider]  allopurinol (ZYLOPRIM) 100 MG tablet Take 100 mg by mouth daily as needed (For gout).    [provider]  amiodarone (PACERONE) 200 MG tablet Take 1 tablet (200 mg total) by mouth daily. 06/27/23   Azalee Course, PA  aspirin EC 81 MG tablet Take 1 tablet (81 mg total) by mouth daily. Swallow whole. Patient taking differently: Take 81 mg by mouth daily. 06/16/23   Lurene Shadow, MD  atorvastatin (LIPITOR) 80 MG tablet Take 1 tablet (80 mg total) by mouth daily. 11/27/23 02/25/24  Sondra Barges, PA-C  clopidogrel (PLAVIX) 75 MG tablet Take 1 tablet (75 mg total) by mouth daily. 06/27/23   Azalee Course, PA  ferric citrate (AURYXIA) 1 GM 210 MG(Fe) tablet Take 210 mg by mouth 3 (three) times daily with meals. Patient not taking: Reported on 11/27/2023    [provider]   FLUoxetine (PROZAC) 20 MG tablet Take 20 mg by mouth at bedtime. Patient not taking: Reported on 11/27/2023    [provider]  furosemide (LASIX) 80 MG tablet Take 80 mg by mouth 2 (two) times daily.    [provider]  levothyroxine (SYNTHROID) 75 MCG tablet Take 75 mcg by mouth daily before breakfast.    [provider]  lidocaine (LIDODERM) 5 % Place 1 patch onto the skin every 12 (twelve) hours. Remove & Discard patch within 12 hours or as directed by MD Patient not taking: Reported on 11/27/2023 09/12/23 09/11/24  Chesley Noon, MD  metoprolol succinate (TOPROL XL) 25 MG 24 hr tablet Take 0.5 tablets (12.5 mg total) by mouth daily. 11/27/23   Dunn, Raymon Mutton, PA-C  nitroGLYCERIN (NITROSTAT) 0.4 MG SL tablet Place 1 tablet (0.4 mg total) under the tongue every 5 (five) minutes x 3 doses as needed for chest pain. 06/27/23   Azalee Course, PA  sitaGLIPtin (JANUVIA) 25 MG tablet Take 25 mg by mouth daily.    [provider]  sodium bicarbonate 650 MG tablet Take 650 mg by mouth 2 (two) times daily. Patient not taking: Reported on 11/27/2023 04/26/19   [provider]      Allergies    Plum pulp, Almond (diagnostic), Lemon oil, Other, Peanut-containing drug products, and Penicillins    Review of Systems   Review of Systems  Physical  Exam Updated Vital Signs BP (!) 140/55   Pulse 63   Temp 97.7 F (36.5 C)   Resp 14   LMP  (LMP Unknown)   SpO2 100%  Physical Exam Vitals and nursing note reviewed.  Constitutional:      General: She is not in acute distress.    Appearance: She is well-developed.  HENT:     Head: Normocephalic and atraumatic.     Right Ear: External ear normal.     Left Ear: External ear normal.     Nose: Nose normal.  Eyes:     Extraocular Movements: Extraocular movements intact.     Conjunctiva/sclera: Conjunctivae normal.     Pupils: Pupils are equal, round, and reactive to light.  Cardiovascular:     Rate and Rhythm: Normal  rate and regular rhythm.     Heart sounds: No murmur heard.    Comments: Tunneled HD catheter in right chest wall Pulmonary:     Effort: Pulmonary effort is normal. No respiratory distress.     Breath sounds: Normal breath sounds.  Musculoskeletal:     Cervical back: Normal range of motion and neck supple.     Right lower leg: Edema present.     Left lower leg: Edema present.  Skin:    General: Skin is warm and dry.  Neurological:     Mental Status: She is alert and oriented to person, place, and time. Mental status is at baseline.  Psychiatric:        Mood and Affect: Mood normal.     ED Results / Procedures / Treatments   Labs (all labs ordered are listed, but only abnormal results are displayed) Labs Reviewed  CBC WITH DIFFERENTIAL/PLATELET - Abnormal; Notable for the following components:      Result Value   Lymphs Abs 0.4 (*)    All other components within normal limits  URINALYSIS, ROUTINE W REFLEX MICROSCOPIC - Abnormal; Notable for the following components:   APPearance CLOUDY (*)    Hgb urine dipstick SMALL (*)    Protein, ur 100 (*)    Leukocytes,Ua MODERATE (*)    Bacteria, UA RARE (*)    All other components within normal limits  COMPREHENSIVE METABOLIC PANEL - Abnormal; Notable for the following components:   CO2 19 (*)    Glucose, Bld 120 (*)    BUN 150 (*)    Creatinine, Ser 8.54 (*)    Total Protein 6.4 (*)    AST 14 (*)    GFR, Estimated 4 (*)    All other components within normal limits  URINE CULTURE    EKG None  Radiology DG Chest 2 View Result Date: 01/14/2024 CLINICAL DATA:  Shortness of breath EXAM: CHEST - 2 VIEW COMPARISON:  09/07/2023 FINDINGS: Right-sided central venous catheter tip at the SVC. Small pleural effusions. Normal cardiomediastinal silhouette with aortic atherosclerosis. No consolidation or pneumothorax. Postsurgical changes at the thoracic inlet IMPRESSION: Small pleural effusions. Electronically Signed   By: Jasmine Pang  M.D.   On: 01/14/2024 22:31    Procedures Procedures    Medications Ordered in ED Medications  cefTRIAXone (ROCEPHIN) 1 g in sodium chloride 0.9 % 100 mL IVPB (0 g Intravenous Stopped 01/15/24 0107)  lidocaine (PF) (XYLOCAINE) 1 % injection (  Given 01/15/24 0006)    ED Course/ Medical Decision Making/ A&P Clinical Course as of 01/15/24 1443  Thu Jan 14, 2024  2348 Signed out to Dr Posey Rea [RP]    Clinical Course User  Index [RP] Rondel Baton, MD                                 Medical Decision Making Amount and/or Complexity of Data Reviewed Labs: ordered. Radiology: ordered.  Risk Prescription drug management.   ALAIYAH BOLLMAN is a 83 y.o. 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    Initial Ddx:  Volume overload, uremia, hyperkalemia, renal failure, UTI  MDM/Course:  Patient presents emergency department with decreased urine output.  Reports that she has not had dialysis almost 10 days.  On exam is not hypoxic or in respiratory distress.  Chest x-ray did show some pleural effusions which is likely from mild volume overload from missing dialysis.  With her urinary retention and urgency did obtain a urinalysis which shows that she may have a UTI.  Given ceftriaxone to treat her in case of UTI.  Urine culture sent.  Awaiting chemistry and was signed out to the oncoming physician.  Upon re-evaluation she was stable.  Of note, her tunneled dialysis line had sutures that had ripped from her skin and she did not have a dressing.  I did clean the area with chlorhexidine and apply new dressing and sutures via sterile technique to secure the line.  This patient presents to the ED for concern of complaints listed in HPI, this involves an  extensive number of treatment options, and is a complaint that carries with it a high risk of complications and morbidity. Disposition including potential need for admission considered.   Dispo: Pending remainder of workup  Records reviewed Outpatient Clinic Notes The following labs were independently interpreted: Urinalysis and show urinary tract infection I independently reviewed the following imaging with scope of interpretation limited to determining acute life threatening conditions related to emergency care: Chest x-ray and agree with the radiologist interpretation with the following exceptions: none I personally reviewed and interpreted cardiac monitoring: normal sinus rhythm  I personally reviewed and interpreted the pt's EKG: see above for interpretation  I have reviewed the patients home medications and made adjustments as needed Social Determinants of health:  Elderly  Portions of this note were generated with Scientist, clinical (histocompatibility and immunogenetics). Dictation errors may occur despite best attempts at proofreading.     Final Clinical Impression(s) / ED Diagnoses Final diagnoses:  ESRD (end stage renal disease) (HCC)  Acute cystitis without hematuria    Rx / DC Orders ED Discharge Orders          Ordered    cefadroxil (DURICEF) 500 MG capsule  2 times daily        01/15/24 0104              Rondel Baton, MD 01/15/24 1443

## 2024-01-15 DIAGNOSIS — N3000 Acute cystitis without hematuria: Secondary | ICD-10-CM | POA: Diagnosis not present

## 2024-01-15 LAB — COMPREHENSIVE METABOLIC PANEL
ALT: 13 U/L (ref 0–44)
AST: 14 U/L — ABNORMAL LOW (ref 15–41)
Albumin: 3.7 g/dL (ref 3.5–5.0)
Alkaline Phosphatase: 71 U/L (ref 38–126)
Anion gap: 15 (ref 5–15)
BUN: 150 mg/dL — ABNORMAL HIGH (ref 8–23)
CO2: 19 mmol/L — ABNORMAL LOW (ref 22–32)
Calcium: 9.6 mg/dL (ref 8.9–10.3)
Chloride: 101 mmol/L (ref 98–111)
Creatinine, Ser: 8.54 mg/dL — ABNORMAL HIGH (ref 0.44–1.00)
GFR, Estimated: 4 mL/min — ABNORMAL LOW (ref >60)
Glucose, Bld: 120 mg/dL — ABNORMAL HIGH (ref 70–99)
Potassium: 4.9 mmol/L (ref 3.5–5.1)
Sodium: 135 mmol/L (ref 135–145)
Total Bilirubin: 0.7 mg/dL (ref 0.0–1.2)
Total Protein: 6.4 g/dL — ABNORMAL LOW (ref 6.5–8.1)

## 2024-01-15 MED ORDER — LIDOCAINE HCL (PF) 1 % IJ SOLN
INTRAMUSCULAR | Status: AC
  Filled 2024-01-15: qty 5

## 2024-01-15 MED ORDER — CEFADROXIL 500 MG PO CAPS: 500.0000 mg | ORAL_CAPSULE | Freq: Two times a day (BID) | ORAL | 0 refills | Status: AC

## 2024-01-15 NOTE — ED Notes (Signed)
MD at bedside for procedure.

## 2024-01-15 NOTE — ED Provider Notes (Signed)
  Physical Exam  BP (!) 140/55   Pulse 64   Temp 97.7 F (36.5 C)   Resp 14   LMP  (LMP Unknown)   SpO2 100%   Physical Exam Vitals and nursing note reviewed.  Constitutional:      General: She is not in acute distress.    Appearance: She is well-developed.  HENT:     Head: Normocephalic and atraumatic.  Eyes:     Conjunctiva/sclera: Conjunctivae normal.  Cardiovascular:     Rate and Rhythm: Normal rate and regular rhythm.     Heart sounds: No murmur heard. Pulmonary:     Effort: Pulmonary effort is normal. No respiratory distress.     Breath sounds: Normal breath sounds.  Abdominal:     Palpations: Abdomen is soft.     Tenderness: There is no abdominal tenderness.  Musculoskeletal:        General: No swelling.     Cervical back: Neck supple.  Skin:    General: Skin is warm and dry.     Capillary Refill: Capillary refill takes less than 2 seconds.  Neurological:     Mental Status: She is alert.  Psychiatric:        Mood and Affect: Mood normal.     Procedures  Procedures  ED Course / MDM   Clinical Course as of 01/15/24 0116  Thu Jan 14, 2024  2348 Signed out to Dr Posey Rea [RP]    Clinical Course User Index [RP] Rondel Baton, MD   Medical Decision Making Amount and/or Complexity of Data Reviewed Labs: ordered. Radiology: ordered.  Risk Prescription drug management.   Patient received in handoff.  ESRD on hemodialysis Tuesday Thursday Saturday pending completion of laboratory evaluation.  Does have UTI and has already received ceftriaxone.  Initial plan was for discharge if potassium not critical as patient has missed multiple dialysis sessions.  Chemistry has a significant elevated BUN to 150, creatinine 8.54, potassium normal at 4.5.  She is not in respiratory distress and is alert and oriented answering all questions appropriately.  She states that she has missed at least 3 dialysis sessions.  We had an extended discussion about the need for  compliance with dialysis and patient's symptoms of decreased appetite and generalized fatigue may improve as azotemia improves.  She has a dialysis appointment for tomorrow morning and she will be discharged this morning to go get her dialysis routinely.  She currently does not meet inpatient criteria for emergent dialysis and thus was discharged to receive her scheduled outpatient dialysis.  Return precautions given to patient and her son       Glendora Score, MD 01/15/24 [phone]

## 2024-01-17 LAB — URINE CULTURE: Culture: >=100000 — AB

## 2024-01-18 ENCOUNTER — Telehealth (HOSPITAL_BASED_OUTPATIENT_CLINIC_OR_DEPARTMENT_OTHER): Payer: Self-pay | Admitting: *Deleted

## 2024-01-18 NOTE — Telephone Encounter (Signed)
Post ED Visit - Positive Culture Follow-up  Culture report reviewed by antimicrobial stewardship pharmacist: [facility] [x]  Ivery Quale, [place].D. []  Celedonio Miyamoto, Pharm.D., BCPS AQ-ID []  Garvin Fila, Pharm.D., BCPS []  Georgina Pillion, Pharm.D., BCPS []  [place], [address].D., BCPS, AAHIVP []  Estella Husk, Pharm.D., BCPS, AAHIVP []  Lysle Pearl, PharmD, BCPS []  Phillips Climes, PharmD, BCPS []  Agapito Games, PharmD, BCPS []  Verlan Friends, PharmD []  Mervyn Gay, PharmD, BCPS []  Vinnie Level, PharmD  [facility] []  Len Childs, PharmD []  Greer Pickerel, PharmD []  Adalberto Cole, PharmD []  Perlie Gold, Rph []  Lonell Face) Jean Rosenthal, PharmD []  Earl Many, PharmD []  Junita Push, PharmD []  Dorna Leitz, PharmD []  Terrilee Files, PharmD []  Lynann Beaver, PharmD []  Keturah Barre, PharmD []  Loralee Pacas, PharmD []  Bernadene Person, PharmD   Positive urine culture Treated with cefadroxil, organism sensitive to the same and no further patient follow-up is required at this time.  Nena Polio Garner Nash 01/18/2024, 10:29 AM

## 2024-01-22 DIAGNOSIS — D509 Iron deficiency anemia, unspecified: Secondary | ICD-10-CM | POA: Diagnosis not present

## 2024-01-22 DIAGNOSIS — N186 End stage renal disease: Secondary | ICD-10-CM | POA: Diagnosis not present

## 2024-01-22 DIAGNOSIS — Z992 Dependence on renal dialysis: Secondary | ICD-10-CM | POA: Diagnosis not present

## 2024-01-22 DIAGNOSIS — N2581 Secondary hyperparathyroidism of renal origin: Secondary | ICD-10-CM | POA: Diagnosis not present

## 2024-01-23 DIAGNOSIS — L299 Pruritus, unspecified: Secondary | ICD-10-CM | POA: Insufficient documentation

## 2024-02-08 ENCOUNTER — Other Ambulatory Visit: Payer: Self-pay

## 2024-02-08 ENCOUNTER — Inpatient Hospital Stay (HOSPITAL_COMMUNITY)
Admission: EM | Admit: 2024-02-08 | Discharge: 2024-02-13 | DRG: 640 | Disposition: A | Discharge status: Home / Self Care | Payer: Medicare Other | Attending: Internal Medicine | Admitting: Internal Medicine

## 2024-02-08 DIAGNOSIS — Z7189 Other specified counseling: Secondary | ICD-10-CM | POA: Diagnosis not present

## 2024-02-08 DIAGNOSIS — Z9109 Other allergy status, other than to drugs and biological substances: Secondary | ICD-10-CM

## 2024-02-08 DIAGNOSIS — Z8249 Family history of ischemic heart disease and other diseases of the circulatory system: Secondary | ICD-10-CM

## 2024-02-08 DIAGNOSIS — N19 Unspecified kidney failure: Principal | ICD-10-CM

## 2024-02-08 DIAGNOSIS — Z880 Allergy status to penicillin: Secondary | ICD-10-CM

## 2024-02-08 DIAGNOSIS — E785 Hyperlipidemia, unspecified: Secondary | ICD-10-CM | POA: Diagnosis present

## 2024-02-08 DIAGNOSIS — J45909 Unspecified asthma, uncomplicated: Secondary | ICD-10-CM | POA: Diagnosis present

## 2024-02-08 DIAGNOSIS — Z85828 Personal history of other malignant neoplasm of skin: Secondary | ICD-10-CM

## 2024-02-08 DIAGNOSIS — Z7984 Long term (current) use of oral hypoglycemic drugs: Secondary | ICD-10-CM

## 2024-02-08 DIAGNOSIS — E039 Hypothyroidism, unspecified: Secondary | ICD-10-CM | POA: Diagnosis present

## 2024-02-08 DIAGNOSIS — M159 Polyosteoarthritis, unspecified: Secondary | ICD-10-CM | POA: Diagnosis present

## 2024-02-08 DIAGNOSIS — D631 Anemia in chronic kidney disease: Secondary | ICD-10-CM | POA: Diagnosis not present

## 2024-02-08 DIAGNOSIS — Z823 Family history of stroke: Secondary | ICD-10-CM

## 2024-02-08 DIAGNOSIS — N186 End stage renal disease: Secondary | ICD-10-CM | POA: Diagnosis present

## 2024-02-08 DIAGNOSIS — Z992 Dependence on renal dialysis: Secondary | ICD-10-CM | POA: Diagnosis not present

## 2024-02-08 DIAGNOSIS — K068 Other specified disorders of gingiva and edentulous alveolar ridge: Secondary | ICD-10-CM | POA: Diagnosis not present

## 2024-02-08 DIAGNOSIS — N2581 Secondary hyperparathyroidism of renal origin: Secondary | ICD-10-CM | POA: Diagnosis not present

## 2024-02-08 DIAGNOSIS — I132 Hypertensive heart and chronic kidney disease with heart failure and with stage 5 chronic kidney disease, or end stage renal disease: Secondary | ICD-10-CM | POA: Diagnosis present

## 2024-02-08 DIAGNOSIS — K5900 Constipation, unspecified: Secondary | ICD-10-CM | POA: Diagnosis present

## 2024-02-08 DIAGNOSIS — E8779 Other fluid overload: Secondary | ICD-10-CM | POA: Diagnosis not present

## 2024-02-08 DIAGNOSIS — Z955 Presence of coronary angioplasty implant and graft: Secondary | ICD-10-CM | POA: Diagnosis not present

## 2024-02-08 DIAGNOSIS — E1122 Type 2 diabetes mellitus with diabetic chronic kidney disease: Secondary | ICD-10-CM | POA: Diagnosis present

## 2024-02-08 DIAGNOSIS — Z91158 Patient's noncompliance with renal dialysis for other reason: Secondary | ICD-10-CM

## 2024-02-08 DIAGNOSIS — Z90710 Acquired absence of both cervix and uterus: Secondary | ICD-10-CM

## 2024-02-08 DIAGNOSIS — Z91010 Allergy to peanuts: Secondary | ICD-10-CM

## 2024-02-08 DIAGNOSIS — F32A Depression, unspecified: Secondary | ICD-10-CM | POA: Diagnosis present

## 2024-02-08 DIAGNOSIS — E875 Hyperkalemia: Secondary | ICD-10-CM | POA: Diagnosis present

## 2024-02-08 DIAGNOSIS — I120 Hypertensive chronic kidney disease with stage 5 chronic kidney disease or end stage renal disease: Secondary | ICD-10-CM | POA: Diagnosis not present

## 2024-02-08 DIAGNOSIS — I5032 Chronic diastolic (congestive) heart failure: Secondary | ICD-10-CM | POA: Diagnosis present

## 2024-02-08 DIAGNOSIS — Z79899 Other long term (current) drug therapy: Secondary | ICD-10-CM

## 2024-02-08 DIAGNOSIS — Z79890 Hormone replacement therapy (postmenopausal): Secondary | ICD-10-CM

## 2024-02-08 DIAGNOSIS — Z8049 Family history of malignant neoplasm of other genital organs: Secondary | ICD-10-CM

## 2024-02-08 DIAGNOSIS — Z7902 Long term (current) use of antithrombotics/antiplatelets: Secondary | ICD-10-CM

## 2024-02-08 DIAGNOSIS — Z515 Encounter for palliative care: Secondary | ICD-10-CM | POA: Diagnosis not present

## 2024-02-08 DIAGNOSIS — Z91018 Allergy to other foods: Secondary | ICD-10-CM

## 2024-02-08 DIAGNOSIS — Z7982 Long term (current) use of aspirin: Secondary | ICD-10-CM

## 2024-02-08 DIAGNOSIS — M7989 Other specified soft tissue disorders: Secondary | ICD-10-CM | POA: Diagnosis present

## 2024-02-08 DIAGNOSIS — I472 Ventricular tachycardia, unspecified: Secondary | ICD-10-CM | POA: Diagnosis present

## 2024-02-08 HISTORY — DX: End stage renal disease: N18.6

## 2024-02-08 HISTORY — DX: End stage renal disease: Z99.2

## 2024-02-08 LAB — COMPREHENSIVE METABOLIC PANEL
ALT: 38 U/L (ref 0–44)
AST: 21 U/L (ref 15–41)
Albumin: 3.6 g/dL (ref 3.5–5.0)
Alkaline Phosphatase: 90 U/L (ref 38–126)
Anion gap: 16 — ABNORMAL HIGH (ref 5–15)
BUN: 157 mg/dL — ABNORMAL HIGH (ref 8–23)
CO2: 17 mmol/L — ABNORMAL LOW (ref 22–32)
Calcium: 10.1 mg/dL (ref 8.9–10.3)
Chloride: 105 mmol/L (ref 98–111)
Creatinine, Ser: 7.9 mg/dL — ABNORMAL HIGH (ref 0.44–1.00)
GFR, Estimated: 5 mL/min — ABNORMAL LOW (ref >60)
Glucose, Bld: 176 mg/dL — ABNORMAL HIGH (ref 70–99)
Potassium: 5.2 mmol/L — ABNORMAL HIGH (ref 3.5–5.1)
Sodium: 138 mmol/L (ref 135–145)
Total Bilirubin: 0.5 mg/dL (ref 0.0–1.2)
Total Protein: 6.1 g/dL — ABNORMAL LOW (ref 6.5–8.1)

## 2024-02-08 LAB — CBC
HCT: 30 % — ABNORMAL LOW (ref 36.0–46.0)
Hemoglobin: 9.2 g/dL — ABNORMAL LOW (ref 12.0–15.0)
MCH: 30.4 pg (ref 26.0–34.0)
MCHC: 30.7 g/dL (ref 30.0–36.0)
MCV: 99 fL (ref 80.0–100.0)
Platelets: 160 10*3/uL (ref 150–400)
RBC: 3.03 MIL/uL — ABNORMAL LOW (ref 3.87–5.11)
RDW: 14.7 % (ref 11.5–15.5)
WBC: 4.1 10*3/uL (ref 4.0–10.5)
nRBC: 0 % (ref 0.0–0.2)

## 2024-02-08 LAB — I-STAT CHEM 8, ED
BUN: >130 mg/dL — ABNORMAL HIGH (ref 8–23)
Calcium, Ion: 1.2 mmol/L (ref 1.15–1.40)
Chloride: 111 mmol/L (ref 98–111)
Creatinine, Ser: 8.8 mg/dL — ABNORMAL HIGH (ref 0.44–1.00)
Glucose, Bld: 170 mg/dL — ABNORMAL HIGH (ref 70–99)
HCT: 26 % — ABNORMAL LOW (ref 36.0–46.0)
Hemoglobin: 8.8 g/dL — ABNORMAL LOW (ref 12.0–15.0)
Potassium: 5 mmol/L (ref 3.5–5.1)
Sodium: 137 mmol/L (ref 135–145)
TCO2: 19 mmol/L — ABNORMAL LOW (ref 22–32)

## 2024-02-08 LAB — TYPE AND SCREEN
ABO/RH(D): A POS
Antibody Screen: NEGATIVE

## 2024-02-08 NOTE — ED Provider Triage Note (Signed)
 Emergency Medicine Provider Triage Evaluation Note  PEARSON REASONS , a 83 y.o. female  was evaluated in triage.  Pt complains of bleeding from her lower gums.  Started a week ago.  Denies any trauma.  Normally on aspirin and Plavix but stopped taking Plavix a week ago.  States the bleeding was much worse the last 48 hours per family member.  She also states she has been more short of breath and fatigued.  Review of Systems  Positive: See above Negative: See above  Physical Exam  BP (!) 150/50 (BP Location: Right Arm)   Pulse 75   Temp 97.7 F (36.5 C) (Oral)   Resp 16   LMP  (LMP Unknown)   SpO2 95%  Gen:   Awake, no distress   Resp:  Normal effort  MSK:   Moves extremities without difficulty  Other:    Medical Decision Making  Medically screening exam initiated at 8:31 PM.  Appropriate orders placed.  [name] was informed that the remainder of the evaluation will be completed by another provider, this initial triage assessment does not replace that evaluation, and the importance of remaining in the ED until their evaluation is complete.  Work up started   Gareth Eagle, [place] 02/08/24 2032

## 2024-02-08 NOTE — ED Triage Notes (Signed)
 Patient reports bleeding at gums (lower) for 1 week , she stopped taking Plavix last week , denies injury , airway intact/respirations unlabored.

## 2024-02-09 ENCOUNTER — Encounter (HOSPITAL_COMMUNITY): Payer: Self-pay | Admitting: Internal Medicine

## 2024-02-09 DIAGNOSIS — Z79899 Other long term (current) drug therapy: Secondary | ICD-10-CM | POA: Diagnosis not present

## 2024-02-09 DIAGNOSIS — E785 Hyperlipidemia, unspecified: Secondary | ICD-10-CM | POA: Diagnosis present

## 2024-02-09 DIAGNOSIS — I120 Hypertensive chronic kidney disease with stage 5 chronic kidney disease or end stage renal disease: Secondary | ICD-10-CM | POA: Diagnosis not present

## 2024-02-09 DIAGNOSIS — I472 Ventricular tachycardia, unspecified: Secondary | ICD-10-CM | POA: Diagnosis present

## 2024-02-09 DIAGNOSIS — K068 Other specified disorders of gingiva and edentulous alveolar ridge: Secondary | ICD-10-CM | POA: Diagnosis present

## 2024-02-09 DIAGNOSIS — M159 Polyosteoarthritis, unspecified: Secondary | ICD-10-CM | POA: Diagnosis present

## 2024-02-09 DIAGNOSIS — I132 Hypertensive heart and chronic kidney disease with heart failure and with stage 5 chronic kidney disease, or end stage renal disease: Secondary | ICD-10-CM | POA: Diagnosis present

## 2024-02-09 DIAGNOSIS — Z8249 Family history of ischemic heart disease and other diseases of the circulatory system: Secondary | ICD-10-CM | POA: Diagnosis not present

## 2024-02-09 DIAGNOSIS — E1122 Type 2 diabetes mellitus with diabetic chronic kidney disease: Secondary | ICD-10-CM | POA: Diagnosis present

## 2024-02-09 DIAGNOSIS — N186 End stage renal disease: Secondary | ICD-10-CM | POA: Diagnosis not present

## 2024-02-09 DIAGNOSIS — I5032 Chronic diastolic (congestive) heart failure: Secondary | ICD-10-CM | POA: Diagnosis present

## 2024-02-09 DIAGNOSIS — Z955 Presence of coronary angioplasty implant and graft: Secondary | ICD-10-CM | POA: Diagnosis not present

## 2024-02-09 DIAGNOSIS — N2581 Secondary hyperparathyroidism of renal origin: Secondary | ICD-10-CM | POA: Diagnosis present

## 2024-02-09 DIAGNOSIS — E875 Hyperkalemia: Secondary | ICD-10-CM | POA: Diagnosis present

## 2024-02-09 DIAGNOSIS — Z85828 Personal history of other malignant neoplasm of skin: Secondary | ICD-10-CM | POA: Diagnosis not present

## 2024-02-09 DIAGNOSIS — Z91158 Patient's noncompliance with renal dialysis for other reason: Secondary | ICD-10-CM | POA: Diagnosis not present

## 2024-02-09 DIAGNOSIS — E039 Hypothyroidism, unspecified: Secondary | ICD-10-CM | POA: Diagnosis present

## 2024-02-09 DIAGNOSIS — Z79890 Hormone replacement therapy (postmenopausal): Secondary | ICD-10-CM | POA: Diagnosis not present

## 2024-02-09 DIAGNOSIS — D631 Anemia in chronic kidney disease: Secondary | ICD-10-CM | POA: Diagnosis present

## 2024-02-09 DIAGNOSIS — Z7189 Other specified counseling: Secondary | ICD-10-CM | POA: Diagnosis not present

## 2024-02-09 DIAGNOSIS — Z515 Encounter for palliative care: Secondary | ICD-10-CM | POA: Diagnosis not present

## 2024-02-09 DIAGNOSIS — E8779 Other fluid overload: Secondary | ICD-10-CM | POA: Diagnosis present

## 2024-02-09 DIAGNOSIS — K5900 Constipation, unspecified: Secondary | ICD-10-CM | POA: Diagnosis present

## 2024-02-09 DIAGNOSIS — F32A Depression, unspecified: Secondary | ICD-10-CM | POA: Diagnosis present

## 2024-02-09 DIAGNOSIS — Z992 Dependence on renal dialysis: Secondary | ICD-10-CM | POA: Diagnosis not present

## 2024-02-09 DIAGNOSIS — J45909 Unspecified asthma, uncomplicated: Secondary | ICD-10-CM | POA: Diagnosis present

## 2024-02-09 DIAGNOSIS — Z7984 Long term (current) use of oral hypoglycemic drugs: Secondary | ICD-10-CM | POA: Diagnosis not present

## 2024-02-09 LAB — HEMOGLOBIN A1C
Hgb A1c MFr Bld: 6.3 % — ABNORMAL HIGH (ref 4.8–5.6)
Mean Plasma Glucose: 134.11 mg/dL

## 2024-02-09 LAB — IRON AND TIBC
Iron: 142 ug/dL (ref 28–170)
Saturation Ratios: 57 % — ABNORMAL HIGH (ref 10.4–31.8)
TIBC: 251 ug/dL (ref 250–450)
UIBC: 109 ug/dL

## 2024-02-09 LAB — GLUCOSE, CAPILLARY: Glucose-Capillary: 110 mg/dL — ABNORMAL HIGH (ref 70–99)

## 2024-02-09 LAB — CBG MONITORING, ED: Glucose-Capillary: 120 mg/dL — ABNORMAL HIGH (ref 70–99)

## 2024-02-09 LAB — FERRITIN: Ferritin: 813 ng/mL — ABNORMAL HIGH (ref 11–307)

## 2024-02-09 LAB — HEPATITIS B SURFACE ANTIGEN: Hepatitis B Surface Ag: NONREACTIVE

## 2024-02-09 LAB — PHOSPHORUS: Phosphorus: 6.7 mg/dL — ABNORMAL HIGH (ref 2.5–4.6)

## 2024-02-09 MED ORDER — BISACODYL 5 MG PO TBEC
5.0000 mg | DELAYED_RELEASE_TABLET | Freq: Every day | ORAL | Status: DC | PRN
  Administered 2024-02-11 – 2024-02-12 (×2): 5 mg via ORAL
  Filled 2024-02-09 (×3): qty 1

## 2024-02-09 MED ORDER — ACETAMINOPHEN 650 MG RE SUPP: 650.0000 mg | Freq: Four times a day (QID) | RECTAL | Status: DC | PRN

## 2024-02-09 MED ORDER — POLYETHYLENE GLYCOL 3350 17 G PO PACK: 17.0000 g | PACK | Freq: Every day | ORAL | Status: DC | PRN

## 2024-02-09 MED ORDER — DESMOPRESSIN ACETATE 4 MCG/ML IJ SOLN
20.0000 ug | Freq: Once | INTRAMUSCULAR | Status: AC
  Administered 2024-02-09: 20 ug via INTRAVENOUS
  Filled 2024-02-09: qty 5

## 2024-02-09 MED ORDER — HYDRALAZINE HCL 20 MG/ML IJ SOLN: 10.0000 mg | Freq: Four times a day (QID) | INTRAMUSCULAR | Status: DC | PRN

## 2024-02-09 MED ORDER — INSULIN ASPART 100 UNIT/ML IJ SOLN
0.0000 [IU] | Freq: Three times a day (TID) | INTRAMUSCULAR | Status: DC
  Administered 2024-02-09: 2 [IU] via SUBCUTANEOUS
  Administered 2024-02-10 (×2): 1 [IU] via SUBCUTANEOUS
  Administered 2024-02-12: 2 [IU] via SUBCUTANEOUS
  Administered 2024-02-13: 3 [IU] via SUBCUTANEOUS
  Administered 2024-02-13: 2 [IU] via SUBCUTANEOUS

## 2024-02-09 MED ORDER — FERRIC CITRATE 1 GM 210 MG(FE) PO TABS
210.0000 mg | ORAL_TABLET | Freq: Three times a day (TID) | ORAL | Status: DC
Start: 2024-02-09 — End: 2024-02-14
  Administered 2024-02-09 – 2024-02-13 (×13): 210 mg via ORAL
  Filled 2024-02-09 (×16): qty 1

## 2024-02-09 MED ORDER — CINACALCET HCL 30 MG PO TABS
30.0000 mg | ORAL_TABLET | ORAL | Status: DC
  Administered 2024-02-10 – 2024-02-12 (×2): 30 mg via ORAL
  Filled 2024-02-09 (×2): qty 1

## 2024-02-09 MED ORDER — FUROSEMIDE 10 MG/ML IJ SOLN
80.0000 mg | Freq: Once | INTRAMUSCULAR | Status: AC
  Administered 2024-02-09: 80 mg via INTRAVENOUS
  Filled 2024-02-09: qty 8

## 2024-02-09 MED ORDER — METOPROLOL SUCCINATE ER 25 MG PO TB24
12.5000 mg | ORAL_TABLET | Freq: Every day | ORAL | Status: DC
  Administered 2024-02-09 – 2024-02-12 (×3): 12.5 mg via ORAL
  Filled 2024-02-09 (×5): qty 1

## 2024-02-09 MED ORDER — ATORVASTATIN CALCIUM 80 MG PO TABS
80.0000 mg | ORAL_TABLET | Freq: Every day | ORAL | Status: DC
  Administered 2024-02-09 – 2024-02-13 (×5): 80 mg via ORAL
  Filled 2024-02-09 (×4): qty 1
  Filled 2024-02-09: qty 2

## 2024-02-09 MED ORDER — AMIODARONE HCL 200 MG PO TABS
200.0000 mg | ORAL_TABLET | Freq: Every day | ORAL | Status: DC
  Administered 2024-02-09 – 2024-02-13 (×5): 200 mg via ORAL
  Filled 2024-02-09 (×5): qty 1

## 2024-02-09 MED ORDER — ALBUTEROL SULFATE (2.5 MG/3ML) 0.083% IN NEBU: 2.5000 mg | INHALATION_SOLUTION | Freq: Four times a day (QID) | RESPIRATORY_TRACT | Status: DC | PRN

## 2024-02-09 MED ORDER — ACETAMINOPHEN 325 MG PO TABS: 650.0000 mg | ORAL_TABLET | Freq: Four times a day (QID) | ORAL | Status: DC | PRN

## 2024-02-09 MED ORDER — INSULIN ASPART 100 UNIT/ML IJ SOLN
0.0000 [IU] | Freq: Every day | INTRAMUSCULAR | Status: DC
  Administered 2024-02-12: 2 [IU] via SUBCUTANEOUS

## 2024-02-09 MED ORDER — LEVOTHYROXINE SODIUM 75 MCG PO TABS
75.0000 ug | ORAL_TABLET | Freq: Every day | ORAL | Status: DC
  Administered 2024-02-10 – 2024-02-13 (×4): 75 ug via ORAL
  Filled 2024-02-09 (×4): qty 1

## 2024-02-09 MED ORDER — ROPINIROLE HCL 0.25 MG PO TABS
0.2500 mg | ORAL_TABLET | Freq: Every evening | ORAL | Status: DC
  Administered 2024-02-10 – 2024-02-13 (×4): 0.25 mg via ORAL
  Filled 2024-02-09 (×6): qty 1

## 2024-02-09 MED ORDER — CHLORHEXIDINE GLUCONATE CLOTH 2 % EX PADS
6.0000 | MEDICATED_PAD | Freq: Every day | CUTANEOUS | Status: DC
Start: 2024-02-09 — End: 2024-02-14
  Administered 2024-02-10 – 2024-02-13 (×4): 6 via TOPICAL

## 2024-02-09 NOTE — Hospital Course (Signed)
 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 Patient is complaining about bleeding from her gum for last 1 week.  Patient reported she has been stopped all her medication around that time as well as aspirin and Plavix. Patient also reported she has not gone for dialysis over a week. Complaining about intermittent shortness of breath and lower extremity swelling. Patient noted that [facility] she does make some urine but for last 1 week she did not made any urine output.  At presentation to ED patient is hemodynamically stable. CMP showing hyperkalemia 5.2, elevated BUN 157, elevated creatinine 7.  9, low albumin and elevated anion gap 16. CBC showing stable H&H 10.2 and 30.  Normal platelet count 160.  Low RBC 3. Type and screen showed O+. In the ED patient has been given Lasix 80 mg IV.  Dr. Madilyn Hook reported that physical exam showed patient has some inner lip laceration but it has been healed completely.  There is no active bleeding now. Initially recommended Dr. Madilyn Hook to give IV desmopressin but as patient does not have any active bleeding which is necessary.  Dr. Madilyn Hook has been spoken with nephrology Dr. Marisue Humble plan for dialysis today in the setting of platelet dysfunction from uremia and volume overload from missing dialysis from 1 week.  Hospitalist has been consulted for further management of volume overload-patient probably need few dialysis session while in the hospital and urine platelet dysfunction.

## 2024-02-09 NOTE — Consult Note (Signed)
 Renal Service Consult Note [facility]  Cassandra Baker 02/09/2024 Maree Krabbe, MD Requesting Physician: Dr. Pola Corn  Reason for Consult: ESRD pt w/ bleeding gums HPI: The patient is a 83 y.o. year-old w/ PMH as below who presented to ED c/o bleeding gums for > 1 week. Pt stopped her plavix. Has not gone to HD for about 1 week. Intermittent SOB, no fever, +bilat leg swelling. In ED labs showed Hb 9, creat 7.9 and BUN 157. Concern for uremia contributing to bleeding. She rec'd a dose of po lasix and was admitted. We are asked to see for dialysis.    Pt seen in room. Pt is very frustrated and thinking about stopping everything and dying. She lives w/ her son and DIL but mostly cares for herself. C/o bilat LE swelling. Also gum bleeding as above. Is on 2.5 hr HD sessions cause she couldn't tolerate the longer times.    PMH Anemia Asthma ESRD on hemodialysis HTN Hypothyroid    ROS - denies CP, no joint pain, no HA, no blurry vision, no rash, no diarrhea, no nausea/ vomiting, no dysuria, no difficulty voiding   Past Medical History  Past Medical History:  Diagnosis Date   Anemia    Arthritis    low back,knees   Asthma    Basal cell carcinoma (BCC)    a. nose   CHF (congestive heart failure) (HCC)    a. prev eval by Dr. Katrinka Blazing in [place] - "many yrs ago."   CKD (chronic kidney disease), stage IV (HCC)    Depression    Diabetes mellitus without complication (HCC)    Dyspnea    Hypertension    Hypothyroidism    Past Surgical History  Past Surgical History:  Procedure Laterality Date   APPENDECTOMY     4 th grade   AV FISTULA PLACEMENT Left 05/12/2019   Procedure: BRACHIOCEPHALIC ARTERIOVENOUS (AV) FISTULA CREATION LEFT ARM;  Surgeon: Nada Libman, MD;  [facility]: [facility] OR;  Service: Vascular;  Laterality: Left;   AV FISTULA PLACEMENT Left 06/22/2023   Procedure: LEFT BRACHIOBASILIC ARTERIOVENOUS (AV) FISTULA CREATION;  Surgeon: Victorino Sparrow, MD;  Location: [facility]  OR;  Service: Vascular;  Laterality: Left;   CORONARY STENT INTERVENTION N/A 06/17/2023   Procedure: CORONARY STENT INTERVENTION;  Surgeon: Iran Ouch, MD;  [facility]: [facility] INVASIVE CV LAB;  Service: Cardiovascular;  Laterality: N/A;   EYE SURGERY  2014   Bilateral 6 mos. apart  cataracts   FISTULA SUPERFICIALIZATION Left 10/06/2019   Procedure: ELEVATION OF ARTERIOVENOUS FISTULA LEFT ARM;  Surgeon: Nada Libman, MD;  Location: [facility] OR;  Service: Vascular;  Laterality: Left;   INSERTION OF DIALYSIS CATHETER N/A 06/22/2023   Procedure: INSERTION OF TUNNELED  DIALYSIS CATHETER USING 19CM PALINDROME CATHETER;  Surgeon: Victorino Sparrow, MD;  Location: [facility] OR;  Service: Vascular;  Laterality: N/A;   LYMPH NODE BIOPSY N/A 02/09/2018   Procedure: SENTINEL LYMPH NODE BIOPSY;  Surgeon: Adolphus Birchwood, MD;  Location: [facility] ORS;  Service: Gynecology;  Laterality: N/A;   PARATHYROIDECTOMY N/A 12/07/2014   Procedure: PARATHYROIDECTOMY;  Surgeon: Darnell Level, MD;  Location: [facility] ORS;  Service: General;  Laterality: N/A;   PERIPHERAL INTRAVASCULAR LITHOTRIPSY  06/17/2023   Procedure: PERIPHERAL INTRAVASCULAR LITHOTRIPSY;  Surgeon: Iran Ouch, MD;  [facility] INVAS[facility] CV LAB;  Service: Cardiovascular;;   RIGHT/LEFT HEART CATH AND CORONARY ANGIOGRAPHY N/A 06/15/2023   Procedure: RIGHT/LEFT HEART CATH AND CORONARY ANGIOGRAPHY;  Surgeon: Iran Ouch, MD;  Location: [facility] INVASIVE CV LAB;  Service: Cardiovascular;  Laterality: N/A;   ROBOTIC ASSISTED TOTAL HYSTERECTOMY WITH BILATERAL SALPINGO OOPHERECTOMY Bilateral 02/09/2018   Procedure: XI ROBOTIC ASSISTED TOTAL HYSTERECTOMY WITH BILATERAL SALPINGO OOPHORECTOMY;  Surgeon: Adolphus Birchwood, MD;  Location: [facility] ORS;  Service: Gynecology;  Laterality: Bilateral;   TEMPORARY DIALYSIS CATHETER N/A 06/12/2023   Procedure: TEMPORARY DIALYSIS CATHETER;  Surgeon: Renford Dills, MD;  Location: [facility] INVASIVE CV LAB;  Service: Cardiovascular;  Laterality: N/A;   THYROID  LOBECTOMY Left 12/07/2014   Procedure: LEFT THYROID LOBECTOMY;  Surgeon: Darnell Level, MD;  Location: [facility] ORS;  Service: General;  Laterality: Left;   TONSILLECTOMY     TUBAL LIGATION     Early thirties   Family History  Family History  Problem Relation Age of Onset   Hypertension Mother        MI's beginning in 16's, died @ 45   Heart disease Mother    Hypertension Father    Heart disease Father    Heart disease Brother    Stroke Brother    Cervical cancer Maternal Aunt    Heart disease Brother    Stroke Brother    Social History  reports that she has never smoked. She has never used smokeless tobacco. She reports that she does not drink alcohol and does not use drugs. Allergies  Allergies  Allergen Reactions   Plum Pulp Anaphylaxis and Cough   Almond (Diagnostic) Itching   Lemon Oil Other (See Comments)    Lemons cause coughing and respiratory issues    Other Itching    Dairy products/Soaps & detergents   Peanut-Containing Drug Products Itching   Penicillins Rash    Tolerates ceftriaxone   Home medications Prior to Admission medications   Medication Sig Start Date End Date Taking? Authorizing Provider  acetaminophen (TYLENOL) 500 MG tablet Take 500-1,000 mg by mouth every 6 (six) hours as needed (pain).    Yes [provider]  allopurinol (ZYLOPRIM) 100 MG tablet Take 100 mg by mouth daily as needed (For gout).   Yes [provider]  amiodarone (PACERONE) 200 MG tablet Take 1 tablet (200 mg total) by mouth daily. 06/27/23  Yes Azalee Course, PA  aspirin EC 81 MG tablet Take 1 tablet (81 mg total) by mouth daily. Swallow whole. Patient taking differently: Take 81 mg by mouth daily. 06/16/23  Yes Lurene Shadow, MD  atorvastatin (LIPITOR) 80 MG tablet Take 1 tablet (80 mg total) by mouth daily. 11/27/23 02/25/24 Yes Dunn, Raymon Mutton, PA-C  clopidogrel (PLAVIX) 75 MG tablet Take 1 tablet (75 mg total) by mouth daily. 06/27/23  Yes Azalee Course, PA  ferric citrate (AURYXIA) 1  GM 210 MG(Fe) tablet Take 210 mg by mouth 3 (three) times daily with meals.   Yes [provider]  levothyroxine (SYNTHROID) 75 MCG tablet Take 75 mcg by mouth daily before breakfast.   Yes [provider]  metoprolol succinate (TOPROL XL) 25 MG 24 hr tablet Take 0.5 tablets (12.5 mg total) by mouth daily. 11/27/23  Yes Dunn, Raymon Mutton, PA-C  rOPINIRole (REQUIP) 0.25 MG tablet Take 0.25 mg by mouth every evening. 01/27/24  Yes [provider]  sitaGLIPtin (JANUVIA) 25 MG tablet Take 25 mg by mouth daily.   Yes [provider]  FLUoxetine (PROZAC) 20 MG tablet Take 20 mg by mouth at bedtime. Patient not taking: Reported on 11/27/2023    [provider]  furosemide (LASIX) 80 MG tablet Take 80 mg by mouth  2 (two) times daily.    [provider]  lidocaine (LIDODERM) 5 % Place 1 patch onto the skin every 12 (twelve) hours. Remove & Discard patch within 12 hours or as directed by MD Patient not taking: Reported on 11/27/2023 09/12/23 09/11/24  Chesley Noon, MD  nitroGLYCERIN (NITROSTAT) 0.4 MG SL tablet Place 1 tablet (0.4 mg total) under the tongue every 5 (five) minutes x 3 doses as needed for chest pain. Patient not taking: Reported on 02/09/2024 06/27/23   Azalee Course, PA  sodium bicarbonate 650 MG tablet Take 650 mg by mouth 2 (two) times daily. Patient not taking: Reported on 11/27/2023 04/26/19   [provider]     Vitals:   02/09/24 0600 02/09/24 0615 02/09/24 0715 02/09/24 0747  BP: (!) 151/60 (!) 141/62 (!) 161/59 (!) 140/50  Pulse: 74 76 86 75  Resp: 19 13 (!) 23 (!) 22  Temp:    97.7 F (36.5 C)  TempSrc:    Oral  SpO2: 98% 99% 99% 98%   Exam Gen alert, no distress, disheveled, upset No rash, cyanosis or gangrene Sclera anicteric, throat clear  No jvd or bruits Chest clear bilat to bases, no rales/ wheezing RRR no RG Abd soft ntnd no mass or ascites +bs GU defer MS no joint effusions or deformity Ext 2+ bilat pretib edema,  no other edema Neuro is alert, Ox 3 , nf    RIJ TDC, clear exit site      Renal-related home meds: - all needing review - auryxia 1 ac tid - metoprolol xl 12.5 every day - lasix 80 bid - sod bicarb 650 bid - others: sl ntg, prozac, januvia, requip, T4, plavix, statin, asa, amiodarone    OP HD: [facility] MWF 2.5H    B200  57kg   3K bath  TDC   Heparin 2000 - last HD 1/31, post wt 53kg - sensipar 30mg  three times per week - venofer 50 weekly - no esa, last Hb 10.9 1/31, last mircera 12/08/23 - last pth 2310  Assessment/ Plan: Abnormal bleeding - of the gums mostly, possibly due to uremic from missed HD. Will give a dose of DDAVP. Plan HD this afternoon.  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 No HD for 18 days. HD this afternoon.  HTN - bp's are good 150/60, cont home meds.  Volume - +vol overloaded w/ LE edema, no resp issues. On RA.  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  Not on esa.  Consider starting. Get Fe/tibc/ ferritin.  Secondary hyperparathyroidism - CCa in range, add on phos. 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       Vinson Moselle  MD [facility] 02/09/2024, 9:07 AM  Recent Labs  Lab 02/08/24 2058 02/08/24 2100  HGB 9.2* 8.8*  ALBUMIN 3.6  --   CALCIUM 10.1  --   CREATININE 7.90* 8.80*  K 5.2* 5.0   Inpatient medications:   acetaminophen **OR** acetaminophen, albuterol, bisacodyl, hydrALAZINE, polyethylene glycol

## 2024-02-09 NOTE — Progress Notes (Signed)
 Lab called due to Blood cultures need to be drawn staff states the can not draw labs in HD

## 2024-02-09 NOTE — H&P (Signed)
 [facility] History and Physical  [name]:[id number] DOB: [birth year] DOA: 02/08/2024 PCP: Renford Dills, MD  Presented from: Home Chief Complaint: Shortness of breath  History of Present Illness: Cassandra Baker is a 83 y.o. 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  2/17, patient presented to the ED with complaint of bleeding at lower gums for 1 week. Patient reports that around that time when she stopped all her medication including aspirin and Plavix. She also missed hemodialysis for about a week.  She also noted intermittent shortness of breath, bilateral leg swelling.  No urine output in 1 week.  In the ED, patient was afebrile, hemodynamically stable, breathing on room air On exam by EDP, patient did not have any active gum bleeding, had some healed inner lip laceration. Initial labs with WBC count 4.1, hemoglobin 9.2, sodium 138, potassium 5.2, glucose 176, BUN/creatinine significant elevated to 157/7.9. Patient was given 1 dose of Lasix IV 80 mg. Hospitalist service was consulted for inpatient management Nephrology was consulted for dialysis.  I received this patient as a carryover admission from last night At the time of my evaluation, patient was lying on bed.  Not in distress.  Alert, awake, able to have a conversation.  No family at bedside. Patient states that she is aware of her frail condition has thought of withdrawing all the care and giving up on dialysis.  But she also states that she may think about continuing dialysis if she finds another dialysis center.  She does not like the one in [place] that she currently goes to.  Review of Systems:  All systems were reviewed and were negative unless otherwise mentioned in the HPI   Past medical history: Past Medical History:  Diagnosis Date   Anemia    Arthritis    low back,knees   Asthma     Basal cell carcinoma (BCC)    a. nose   CHF (congestive heart failure) (HCC)    a. prev eval by Dr. Katrinka Blazing in [place] - "many yrs ago."   Depression    Diabetes mellitus without complication (HCC)    ESRD on hemodialysis (HCC)    [place] MWF   Hypertension    Hypothyroidism     Past surgical history: Past Surgical History:  Procedure Laterality Date   APPENDECTOMY     4 th grade   AV FISTULA PLACEMENT Left 05/12/2019   Procedure: BRACHIOCEPHALIC ARTERIOVENOUS (AV) FISTULA CREATION LEFT ARM;  Surgeon: Nada Libman, MD;  Location: [facility] OR;  Service: Vascular;  Laterality: Left;   AV FISTULA PLACEMENT Left 06/22/2023   Procedure: LEFT BRACHIOBASILIC ARTERIOVENOUS (AV) FISTULA CREATION;  Surgeon: Victorino Sparrow, MD;  Location: [facility] OR;  Service: Vascular;  Laterality: Left;   CORONARY STENT INTERVENTION N/A 06/17/2023   Procedure: CORONARY STENT INTERVENTION;  Surgeon: Iran Ouch, MD;  [facility] INVASIVE CV LAB;  Service: Cardiovascular;  Laterality: N/A;   EYE SURGERY  2014   Bilateral 6 mos. apart  cataracts   FISTULA SUPERFICIALIZATION Left 10/06/2019   Procedure: ELEVATION OF ARTERIOVENOUS FISTULA LEFT ARM;  Surgeon: Nada Libman, MD;  Location: [facility] OR;  Service: Vascular;  Laterality: Left;   INSERTION OF DIALYSIS CATHETER N/A 06/22/2023   Procedure: INSERTION OF TUNNELED  DIALYSIS CATHETER USING 19CM PALINDROME CATHETER;  Surgeon: Victorino Sparrow, MD;  Location: [facility] OR;  Service: Vascular;  Laterality: N/A;   LYMPH  NODE BIOPSY N/A 02/09/2018   Procedure: SENTINEL LYMPH NODE BIOPSY;  Surgeon: Adolphus Birchwood, MD;  Location: [facility] ORS;  Service: Gynecology;  Laterality: N/A;   PARATHYROIDECTOMY N/A 12/07/2014   Procedure: PARATHYROIDECTOMY;  Surgeon: Darnell Level, MD;  Location: [facility] ORS;  Service: General;  Laterality: N/A;   PERIPHERAL INTRAVASCULAR LITHOTRIPSY  06/17/2023   Procedure: PERIPHERAL INTRAVASCULAR LITHOTRIPSY;  Surgeon: Iran Ouch, MD;  [facility] LAB;   Service: Cardiovascular;;   RIGHT/LEFT HEART CATH AND CORONARY ANGIOGRAPHY N/A 06/15/2023   Procedure: RIGHT/LEFT HEART CATH AND CORONARY ANGIOGRAPHY;  Surgeon: Iran Ouch, MD;  Location: [facility] INVASIVE CV LAB;  Service: Cardiovascular;  Laterality: N/A;   ROBOTIC ASSISTED TOTAL HYSTERECTOMY WITH BILATERAL SALPINGO OOPHERECTOMY Bilateral 02/09/2018   Procedure: XI ROBOTIC ASSISTED TOTAL HYSTERECTOMY WITH BILATERAL SALPINGO OOPHORECTOMY;  Surgeon: Adolphus Birchwood, MD;  Location: [facility] ORS;  Service: Gynecology;  Laterality: Bilateral;   TEMPORARY DIALYSIS CATHETER N/A 06/12/2023   Procedure: TEMPORARY DIALYSIS CATHETER;  Surgeon: Renford Dills, MD;  Location: [facility] INVASIVE CV LAB;  Service: Cardiovascular;  Laterality: N/A;   THYROID LOBECTOMY Left 12/07/2014   Procedure: LEFT THYROID LOBECTOMY;  Surgeon: Darnell Level, MD;  Location: [facility] ORS;  Service: General;  Laterality: Left;   TONSILLECTOMY     TUBAL LIGATION     Early thirties    Social History:  reports that she has never smoked. She has never used smokeless tobacco. She reports that she does not drink alcohol and does not use drugs.  Allergies:  Allergies  Allergen Reactions   Plum Pulp Anaphylaxis and Cough   Almond (Diagnostic) Itching   Lemon Oil Other (See Comments)    Lemons cause coughing and respiratory issues    Other Itching    Dairy products/Soaps & detergents   Peanut-Containing Drug Products Itching   Pork-Derived Products     Religious beliefs   Penicillins Rash    Tolerates ceftriaxone   Plum pulp, Almond (diagnostic), Lemon oil, Other, Peanut-containing drug products, Pork-derived products, and Penicillins   Family history:  Family History  Problem Relation Age of Onset   Hypertension Mother        MI's beginning in 49's, died @ 51   Heart disease Mother    Hypertension Father    Heart disease Father    Heart disease Brother    Stroke Brother    Cervical cancer Maternal Aunt    Heart disease Brother     Stroke Brother      Physical Exam: Vitals:   02/09/24 0747 02/09/24 0800 02/09/24 1100 02/09/24 1115  BP: (!) 140/50 (!) 136/50 (!) 149/58 (!) 126/45  Pulse: 75 75 82 71  Resp: (!) 22 19 (!) 7 (!) 0  Temp: 97.7 F (36.5 C)   97.7 F (36.5 C)  TempSrc: Oral   Oral  SpO2: 98% 96% 97% 95%   Wt Readings from Last 3 Encounters:  11/27/23 61.5 kg  09/12/23 64.2 kg  09/08/23 63 kg   There is no height or weight on file to calculate BMI.  General exam: Pleasant, elderly Caucasian female Skin: No rashes, lesions or ulcers. HEENT: Atraumatic, normocephalic, crusted blood noted in lips Lungs: Clear to auscultation bilaterally,  CVS: S1, S2, no murmur,   GI/Abd: Soft, nontender, nondistended, bowel sound present,   CNS: Alert, awake, oriented x 3 Psychiatry: Sad affect Extremities: Trace to 1+ bilateral pedal edema, no calf tenderness,    ----------------------------------------------------------------------------------------------------------------------------------------- ----------------------------------------------------------------------------------------------------------------------------------------- -----------------------------------------------------------------------------------------------------------------------------------------  Assessment/Plan: Principal Problem:   Gums, bleeding  Abnormal bleeding of the gums Likely due to uremia from missed hemodialysis No active bleeding currently.  Noted a plan of DDAVP by nephrology  ESRD Missed dialysis Volume overload status Supposed to be on dialysis MWF.  But no dialysis for 18 days Blood pressure in good in 150s but has bilateral lower EXTR edema  Noted a plan of dialysis this afternoon. PTA meds- Toprol 12.5 mg daily, Lasix 80 mg twice daily Resume Toprol.  H/o polymorphic V. Tach Resume amiodarone  Type 2 diabetes mellitus A1c 6.4 in June 2024 PTA meds-Januvia 25 mg daily Start SSI/Accu-Cheks Lab  Results  Component Value Date   HGBA1C 6.3 (H) 02/08/2024   Recent Labs  Lab 02/09/24 1242  GLUCAP 120*    HLD Resume statin.  Hold aspirin and Plavix for now  Chronic anemia Hemoglobin stable close to 9. Recent Labs    06/05/23 0521 06/06/23 0436 06/15/23 0413 06/15/23 1001 07/02/23 1039 09/07/23 0505 01/14/24 2251 02/08/24 2058 02/08/24 2100 02/09/24 1039  HGB 9.2*   < > 8.0*   < > 9.4* 10.7* 12.1 9.2* 8.8*  --   MCV 97.4   < > 95.9   < > 98.3 97.7 97.7 99.0  --   --   VITAMINB12 344  --   --   --   --   --   --   --   --   --   FOLATE 16.6  --   --   --   --   --   --   --   --   --   FERRITIN  --   --  152  --   --   --   --   --   --  813*  TIBC  --   --  287  --   --   --   --   --   --  251  IRON  --   --  69  --   --   --   --   --   --  142   < > = values in this interval not displayed.    Arthritis  Depression Seems no longer on Prozac  Hypothyroidism Synthroid  Mobility: Encourage ambulation  Goals of care:   Code Status: Full Code  Patient states that she is aware of her frail condition has thought of withdrawing all the care and giving up on dialysis.  But she also states that she may think about continuing dialysis if she finds another dialysis center.  She does not like the one in [place] that she currently goes to. Consult palliative care   DVT prophylaxis:  SCDs Start: 02/09/24 0810   Antimicrobials: None Fluid: None Consultants: Nephrology Family Communication: None at bedside  Dispo: The patient is from: Home              Anticipated d/c is to: Hopefully home in 1 to 2 days  Diet: Diet Order             Diet renal with fluid restriction Fluid restriction: 1200 mL Fluid; Room service appropriate? Yes; Fluid consistency: Thin  Diet effective now                    ------------------------------------------------------------------------------------- Severity of Illness: The appropriate patient status for this patient is  INPATIENT. Inpatient status is judged to be reasonable and necessary in order to provide the required intensity of service to ensure the patient's safety.  The patient's presenting symptoms, physical exam findings, and initial radiographic and laboratory data in the context of their chronic comorbidities is felt to place them at high risk for further clinical deterioration. Furthermore, it is not anticipated that the patient will be medically stable for discharge from the hospital within 2 midnights of admission.   * I certify that at the point of admission it is my clinical judgment that the patient will require inpatient hospital care spanning beyond 2 midnights from the point of admission due to high intensity of service, high risk for further deterioration and high frequency of surveillance required.* -------------------------------------------------------------------------------------   Home Meds: Prior to Admission medications   Medication Sig Start Date End Date Taking? Authorizing Provider  acetaminophen (TYLENOL) 500 MG tablet Take 500-1,000 mg by mouth every 6 (six) hours as needed (pain).    Yes [provider]  allopurinol (ZYLOPRIM) 100 MG tablet Take 100 mg by mouth daily as needed (For gout).   Yes [provider]  amiodarone (PACERONE) 200 MG tablet Take 1 tablet (200 mg total) by mouth daily. 06/27/23  Yes Azalee Course, PA  aspirin EC 81 MG tablet Take 1 tablet (81 mg total) by mouth daily. Swallow whole. Patient taking differently: Take 81 mg by mouth daily. 06/16/23  Yes Lurene Shadow, MD  atorvastatin (LIPITOR) 80 MG tablet Take 1 tablet (80 mg total) by mouth daily. 11/27/23 02/25/24 Yes Dunn, Raymon Mutton, PA-C  clopidogrel (PLAVIX) 75 MG tablet Take 1 tablet (75 mg total) by mouth daily. 06/27/23  Yes Azalee Course, PA  ferric citrate (AURYXIA) 1 GM 210 MG(Fe) tablet Take 210 mg by mouth 3 (three) times daily with meals.   Yes [provider]  levothyroxine (SYNTHROID)  75 MCG tablet Take 75 mcg by mouth daily before breakfast.   Yes [provider]  metoprolol succinate (TOPROL XL) 25 MG 24 hr tablet Take 0.5 tablets (12.5 mg total) by mouth daily. 11/27/23  Yes Dunn, Raymon Mutton, PA-C  rOPINIRole (REQUIP) 0.25 MG tablet Take 0.25 mg by mouth every evening. 01/27/24  Yes [provider]  sitaGLIPtin (JANUVIA) 25 MG tablet Take 25 mg by mouth daily.   Yes [provider]  FLUoxetine (PROZAC) 20 MG tablet Take 20 mg by mouth at bedtime. Patient not taking: Reported on 11/27/2023    [provider]  furosemide (LASIX) 80 MG tablet Take 80 mg by mouth 2 (two) times daily.    [provider]  lidocaine (LIDODERM) 5 % Place 1 patch onto the skin every 12 (twelve) hours. Remove & Discard patch within 12 hours or as directed by MD Patient not taking: Reported on 11/27/2023 09/12/23 09/11/24  Chesley Noon, MD  nitroGLYCERIN (NITROSTAT) 0.4 MG SL tablet Place 1 tablet (0.4 mg total) under the tongue every 5 (five) minutes x 3 doses as needed for chest pain. Patient not taking: Reported on 02/09/2024 06/27/23   Azalee Course, PA  sodium bicarbonate 650 MG tablet Take 650 mg by mouth 2 (two) times daily. Patient not taking: Reported on 11/27/2023 04/26/19   [provider]    Labs on Admission:   CBC: Recent Labs  Lab 02/08/24 2058 02/08/24 2100  WBC 4.1  --   HGB 9.2* 8.8*  HCT 30.0* 26.0*  MCV 99.0  --   PLT 160  --     Basic Metabolic Panel: Recent Labs  Lab 02/08/24 2058 02/08/24 2100 02/09/24 1039  NA 138 137  --   K 5.2* 5.0  --  CL 105 111  --   CO2 17*  --   --   GLUCOSE 176* 170*  --   BUN 157* >130*  --   CREATININE 7.90* 8.80*  --   CALCIUM 10.1  --   --   PHOS  --   --  6.7*    Liver Function Tests: Recent Labs  Lab 02/08/24 2058  AST 21  ALT 38  ALKPHOS 90  BILITOT 0.5  PROT 6.1*  ALBUMIN 3.6   No results for input(s): "LIPASE", "AMYLASE" in the last 168 hours. No results for input(s):  "AMMONIA" in the last 168 hours.  Cardiac Enzymes: No results for input(s): "CKTOTAL", "CKMB", "CKMBINDEX", "TROPONINI" in the last 168 hours.  BNP (last 3 results) Recent Labs    06/03/23 2353 06/11/23 1322 07/02/23 1039  BNP 2,261.8* 2,338.2* >4,500.0*    ProBNP (last 3 results) No results for input(s): "PROBNP" in the last 8760 hours.  CBG: Recent Labs  Lab 02/09/24 1242  GLUCAP 120*    Lipase     Component Value Date/Time   LIPASE 192 03/03/2014 1004     Urinalysis    Component Value Date/Time   COLORURINE YELLOW 01/14/2024 2304   APPEARANCEUR CLOUDY (A) 01/14/2024 2304   APPEARANCEUR Clear 03/03/2014 1045   LABSPEC 1.011 01/14/2024 2304   LABSPEC 1.005 03/03/2014 1045   PHURINE 5.0 01/14/2024 2304   GLUCOSEU NEGATIVE 01/14/2024 2304   GLUCOSEU 50 mg/dL 09/81/1914 7829   HGBUR SMALL (A) 01/14/2024 2304   BILIRUBINUR NEGATIVE 01/14/2024 2304   BILIRUBINUR Negative 03/03/2014 1045   KETONESUR NEGATIVE 01/14/2024 2304   PROTEINUR 100 (A) 01/14/2024 2304   NITRITE NEGATIVE 01/14/2024 2304   LEUKOCYTESUR MODERATE (A) 01/14/2024 2304   LEUKOCYTESUR 3+ 03/03/2014 1045     Drugs of Abuse  No results found for: "LABOPIA", "COCAINSCRNUR", "LABBENZ", "AMPHETMU", "THCU", "LABBARB"    Radiological Exams on Admission: No results found.   Signed, Lorin Glass, MD [facility] 02/09/2024

## 2024-02-09 NOTE — Procedures (Signed)
 I was present at the procedure, reviewed the HD regimen and made appropriate changes.   Vinson Moselle MD  [facility] 02/09/2024, 5:06 PM

## 2024-02-09 NOTE — Progress Notes (Signed)
   02/09/24 1805  Vitals  Temp 97.9 F (36.6 C)  Pulse Rate 60  Resp 18  BP (!) 104/42  SpO2 98 %  O2 Device Room Air  Weight 51.5 kg  Type of Weight Post-Dialysis  Post Treatment  Dialyzer Clearance Lightly streaked  Hemodialysis Intake (mL) 0 mL  Liters Processed 30  Fluid Removed (mL) 2000 mL  Tolerated HD Treatment Yes  Post-Hemodialysis Comments pt very anxious last hour of HD     02/09/24 1805  Vitals  Temp 97.9 F (36.6 C)  Pulse Rate 60  Resp 18  BP (!) 104/42  SpO2 98 %  O2 Device Room Air  Weight 51.5 kg  Type of Weight Post-Dialysis  Post Treatment  Dialyzer Clearance Lightly streaked  Hemodialysis Intake (mL) 0 mL  Liters Processed 30  Fluid Removed (mL) 2000 mL  Tolerated HD Treatment Yes  Post-Hemodialysis Comments pt very anxious last hour of HD   Received patient in bed to unit.  Alert and oriented.  Informed consent signed and in chart.   TX duration:2.5hrs  Patient tolerated well.  Transported back to the room  Alert, without acute distress.  Hand-off given to patient's nurse.   Access used: [facility] Access issues: no drsg present on arrival  Total UF removed: 2L Medication(s) given: none    [facility]

## 2024-02-09 NOTE — ED Provider Notes (Signed)
 [place] EMERGENCY DEPARTMENT AT [facility] Provider Note   CSN: [id number] Arrival date & time: 02/08/24  2005     History  Chief Complaint  Patient presents with   Bleeding Gums    Takes Plavix    Cassandra Baker is a 83 y.o. female.  The history is provided by the patient.  Cassandra Baker is a 83 y.o. female who presents to the Emergency Department complaining of bleeding gums.  She [facility] emergency department accompanied by her son for evaluation of bleeding from her gums that started about 1 week ago.  She stopped all of her medications around that time as well because she does take Plavix.  She has a history of ESRD on hemodialysis and has not had dialysis for over a week.  No fever.  She does report intermittent shortness of breath.  She also reports bilateral lower extremity edema.  She reports that she has not made any urine in 1 week until she arrived to the emergency department.     Home Medications Prior to Admission medications   Medication Sig Start Date End Date Taking? Authorizing Provider  acetaminophen (TYLENOL) 500 MG tablet Take 500-1,000 mg by mouth every 6 (six) hours as needed (pain).    Yes [provider]  allopurinol (ZYLOPRIM) 100 MG tablet Take 100 mg by mouth daily as needed (For gout).   Yes [provider]  amiodarone (PACERONE) 200 MG tablet Take 1 tablet (200 mg total) by mouth daily. 06/27/23  Yes Azalee Course, PA  aspirin EC 81 MG tablet Take 1 tablet (81 mg total) by mouth daily. Swallow whole. Patient taking differently: Take 81 mg by mouth daily. 06/16/23  Yes Lurene Shadow, MD  atorvastatin (LIPITOR) 80 MG tablet Take 1 tablet (80 mg total) by mouth daily. 11/27/23 02/25/24 Yes Dunn, Raymon Mutton, PA-C  clopidogrel (PLAVIX) 75 MG tablet Take 1 tablet (75 mg total) by mouth daily. 06/27/23  Yes Azalee Course, PA  ferric citrate (AURYXIA) 1 GM 210 MG(Fe) tablet Take 210 mg by mouth 3 (three) times daily with meals.   Yes [provider]  levothyroxine (SYNTHROID) 75 MCG tablet Take 75 mcg by mouth daily before breakfast.   Yes [provider]  metoprolol succinate (TOPROL XL) 25 MG 24 hr tablet Take 0.5 tablets (12.5 mg total) by mouth daily. 11/27/23  Yes Dunn, Raymon Mutton, PA-C  rOPINIRole (REQUIP) 0.25 MG tablet Take 0.25 mg by mouth every evening. 01/27/24  Yes [provider]  sitaGLIPtin (JANUVIA) 25 MG tablet Take 25 mg by mouth daily.   Yes [provider]  FLUoxetine (PROZAC) 20 MG tablet Take 20 mg by mouth at bedtime. Patient not taking: Reported on 11/27/2023    [provider]  furosemide (LASIX) 80 MG tablet Take 80 mg by mouth 2 (two) times daily.    [provider]  lidocaine (LIDODERM) 5 % Place 1 patch onto the skin every 12 (twelve) hours. Remove & Discard patch within 12 hours or as directed by MD Patient not taking: Reported on 11/27/2023 09/12/23 09/11/24  Chesley Noon, MD  nitroGLYCERIN (NITROSTAT) 0.4 MG SL tablet Place 1 tablet (0.4 mg total) under the tongue every 5 (five) minutes x 3 doses as needed for chest pain. Patient not taking: Reported on 02/09/2024 06/27/23   Azalee Course, PA  sodium bicarbonate 650 MG tablet Take 650 mg by mouth 2 (two) times daily. Patient not taking: Reported on 11/27/2023 04/26/19   [provider]      Allergies    Plum pulp, Almond (diagnostic), Lemon oil, Other, Peanut-containing drug products, and Penicillins    Review of Systems   Review of Systems  All other systems reviewed and are negative.   Physical Exam Updated Vital Signs BP (!) 141/62   Pulse 76   Temp 98.2 F (36.8 C) (Oral)   Resp 13   LMP  (LMP Unknown)   SpO2 99%  Physical Exam Vitals and nursing note reviewed.  Constitutional:      Appearance: She is well-developed.  HENT:     Head: Normocephalic and atraumatic.     Comments: There is a hemostatic wound to the left lower inner lip Cardiovascular:     Rate and Rhythm: Normal rate and  regular rhythm.  Pulmonary:     Effort: Pulmonary effort is normal. No respiratory distress.     Breath sounds: Normal breath sounds.  Abdominal:     Palpations: Abdomen is soft.     Tenderness: There is no abdominal tenderness. There is no guarding or rebound.  Musculoskeletal:        General: No tenderness.     Comments: 2-3+ pitting edema to bilateral lower extremities  Skin:    General: Skin is warm and dry.  Neurological:     Mental Status: She is alert and oriented to person, place, and time.  Psychiatric:        Behavior: Behavior normal.     ED Results / Procedures / Treatments   Labs (all labs ordered are listed, but only abnormal results are displayed) Labs Reviewed  CBC - Abnormal; Notable for the following components:      Result Value   RBC 3.03 (*)    Hemoglobin 9.2 (*)    HCT 30.0 (*)    All other components within normal limits  COMPREHENSIVE METABOLIC PANEL - Abnormal; Notable for the following components:   Potassium 5.2 (*)    CO2 17 (*)    Glucose, Bld 176 (*)    BUN 157 (*)    Creatinine, Ser 7.90 (*)    Total Protein 6.1 (*)    GFR, Estimated 5 (*)    Anion gap 16 (*)    All other components within normal limits  I-STAT CHEM 8, ED - Abnormal; Notable for the following components:   BUN >130 (*)    Creatinine, Ser 8.80 (*)    Glucose, Bld 170 (*)    TCO2 19 (*)    Hemoglobin 8.8 (*)    HCT 26.0 (*)    All other components within normal limits  TYPE AND SCREEN    EKG None  Radiology No results found.  Procedures Procedures    Medications Ordered in ED Medications  furosemide (LASIX) injection 80 mg (80 mg Intravenous Given 02/09/24 0608)    ED Course/ Medical Decision Making/ A&P                                 Medical Decision Making Risk Prescription drug management. Decision regarding hospitalization.   Patient with history of ESRD on hemodialysis here for evaluation of bleeding from her mouth.  She has missed dialysis  due to not liking her dialysis center.  On examination she does not have active bleeding but does have a wound on her left inner lip that has evidence of prior bleeding.  CBC with hemoglobin of 9, decreased from  most recent but similar when compared to priors in the records.  CMP with significant elevation in her BUN to 157, mild hyperkalemia.  Suspect her uremia is a factor in her recurrent bleeding.  She does report holding her Plavix as well as all of her other medications.  She also reports that she has not been making urine since holding her furosemide.  Will treat with a dose of furosemide.  Discussed with Dr. Marisue Humble with nephrology-patient may be admitted to the medicine service and she can get dialyzed later today.  Patient does not require blood transfusion at this time.  Patient and son at bedside updated Darlis Loan studies.  Hospitalist consulted for admission for ongoing care.        Final Clinical Impression(s) / ED Diagnoses Final diagnoses:  Uremia  ESRD on hemodialysis [facility])    Rx / DC Orders ED Discharge Orders     None         Tilden Fossa, MD 02/09/24 [phone]

## 2024-02-10 DIAGNOSIS — Z515 Encounter for palliative care: Secondary | ICD-10-CM

## 2024-02-10 DIAGNOSIS — N186 End stage renal disease: Secondary | ICD-10-CM

## 2024-02-10 DIAGNOSIS — K068 Other specified disorders of gingiva and edentulous alveolar ridge: Secondary | ICD-10-CM | POA: Diagnosis not present

## 2024-02-10 DIAGNOSIS — Z7189 Other specified counseling: Secondary | ICD-10-CM | POA: Diagnosis not present

## 2024-02-10 DIAGNOSIS — Z992 Dependence on renal dialysis: Secondary | ICD-10-CM

## 2024-02-10 LAB — GLUCOSE, CAPILLARY
Glucose-Capillary: 159 mg/dL — ABNORMAL HIGH (ref 70–99)
Glucose-Capillary: 147 mg/dL — ABNORMAL HIGH (ref 70–99)
Glucose-Capillary: 89 mg/dL (ref 70–99)
Glucose-Capillary: 143 mg/dL — ABNORMAL HIGH (ref 70–99)
Glucose-Capillary: 126 mg/dL — ABNORMAL HIGH (ref 70–99)

## 2024-02-10 LAB — HEPATITIS B SURFACE ANTIBODY, QUANTITATIVE: Hep B S AB Quant (Post): <3.5 m[IU]/mL — ABNORMAL LOW

## 2024-02-10 LAB — BASIC METABOLIC PANEL
Anion gap: 11 (ref 5–15)
BUN: 86 mg/dL — ABNORMAL HIGH (ref 8–23)
CO2: 22 mmol/L (ref 22–32)
Calcium: 9.5 mg/dL (ref 8.9–10.3)
Chloride: 101 mmol/L (ref 98–111)
Creatinine, Ser: 5.97 mg/dL — ABNORMAL HIGH (ref 0.44–1.00)
GFR, Estimated: 7 mL/min — ABNORMAL LOW (ref >60)
Glucose, Bld: 131 mg/dL — ABNORMAL HIGH (ref 70–99)
Potassium: 4 mmol/L (ref 3.5–5.1)
Sodium: 134 mmol/L — ABNORMAL LOW (ref 135–145)

## 2024-02-10 LAB — CBC
HCT: 24.5 % — ABNORMAL LOW (ref 36.0–46.0)
Hemoglobin: 7.9 g/dL — ABNORMAL LOW (ref 12.0–15.0)
MCH: 30.3 pg (ref 26.0–34.0)
MCHC: 32.2 g/dL (ref 30.0–36.0)
MCV: 93.9 fL (ref 80.0–100.0)
Platelets: 133 10*3/uL — ABNORMAL LOW (ref 150–400)
RBC: 2.61 MIL/uL — ABNORMAL LOW (ref 3.87–5.11)
RDW: 14.5 % (ref 11.5–15.5)
WBC: 6.3 10*3/uL (ref 4.0–10.5)
nRBC: 0 % (ref 0.0–0.2)

## 2024-02-10 MED ORDER — FUROSEMIDE 40 MG PO TABS
80.0000 mg | ORAL_TABLET | Freq: Two times a day (BID) | ORAL | Status: DC
  Administered 2024-02-10 – 2024-02-13 (×6): 80 mg via ORAL
  Filled 2024-02-10 (×8): qty 2

## 2024-02-10 NOTE — Progress Notes (Signed)
 [facility] Progress Note  Subjective:  No c/o Does not want any more dialysis  Vitals:   02/10/24 0009 02/10/24 0514 02/10/24 0826 02/10/24 1119  BP: (!) 147/56 (!) 123/54 139/61 (!) 137/51  Pulse: 73 70 72 (!) 59  Resp: 18 18 18 18   Temp: 97.8 F (36.6 C) 98.1 F (36.7 C) 98.1 F (36.7 C) 98.3 F (36.8 C)  TempSrc:      SpO2: 100% 100% 97% 100%  Weight:        Exam: Gen alert, disheveled Sclera anicteric, throat clear  No jvd or bruits Chest clear bilat to bases RRR no RG Abd soft ntnd no mass or ascites +bs Ext 2+ bilat pretib edema, Neuro is alert, Ox 3 , nf    RIJ TDC, clear exit site    OP HD: [place] MWF 2.5H    B200  57kg   3K bath  TDC   Heparin 2000 - last HD 1/31, post wt 53kg - sensipar 30mg  three times per week    Assessment/ Plan: Abnormal bleeding - gave DDAVP. Has HD yesterday off schedule.  ESRD - on HD MWF 2.5 hrs. HD here yesterday. Hold on HD for now until pall care makes a decision about hospice transition.  HTN - bp's are good 130/60, cont home meds.  Volume - 1-2+ LE edema, no resp issues. On RA.  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  Not on esa.  Consider starting. Get Fe/tibc/ ferritin.  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 Palliative care team is working with her now.      Vinson Moselle MD  [facility] 02/10/2024, 11:47 AM  Recent Labs  Lab 02/08/24 2058 02/08/24 2100 02/09/24 1039 02/10/24 0638  HGB 9.2* 8.8*  --  7.9*  ALBUMIN 3.6  --   --   --   CALCIUM 10.1  --   --  9.5  PHOS  --   --  6.7*  --   CREATININE 7.90* 8.80*  --  5.97*  K 5.2* 5.0  --  4.0   Recent Labs  Lab 02/09/24 1039  IRON 142  TIBC 251  FERRITIN 813*   Inpatient medications:  amiodarone  200 mg Oral Daily   atorvastatin  80 mg Oral Daily   Chlorhexidine Gluconate Cloth  6 each Topical Q0600   cinacalcet  30 mg Oral Q M,W,F-1800   ferric citrate  210 mg Oral TID WC   insulin aspart  0-5 Units  Subcutaneous QHS   insulin aspart  0-9 Units Subcutaneous TID WC   levothyroxine  75 mcg Oral Q0600   metoprolol succinate  12.5 mg Oral Daily   rOPINIRole  0.25 mg Oral QPM    acetaminophen **OR** acetaminophen, albuterol, bisacodyl, hydrALAZINE, polyethylene glycol

## 2024-02-10 NOTE — Consult Note (Signed)
 Palliative Care Consult Note                                  Date: 02/10/2024   Patient Name: Cassandra Baker  DOB: 10/16/1941  MRN: [id number]  Age / Sex: 83 y.o., female  PCP: Renford Dills, MD Referring Physician: Lorin Glass, MD  Reason for Consultation: Establishing goals of care  HPI/Patient Profile: 83 y.o. 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  She presented with bleeding gums for 1 week, noted to have skipped dialysis for approximately 18 days stating "I want to stop dialysis and die."  She was admitted on 02/08/2024 with abnormal gum bleeding, ESRD with missed dialysis and volume overload, diabetes, and others.   Palliative medicine was consulted for GOC conversations.  Past Medical History:  Diagnosis Date   Anemia    Arthritis    low back,knees   Asthma    Basal cell carcinoma (BCC)    a. nose   CHF (congestive heart failure) (HCC)    a. prev eval by Dr. Katrinka Blazing in [place] - "many yrs ago."   Depression    Diabetes mellitus without complication (HCC)    ESRD on hemodialysis (HCC)    [place] MWF   Hypertension    Hypothyroidism     Subjective:   This NP Wynne Dust reviewed medical records, received report from team, assessed the patient and then meet at the patient's bedside to discuss diagnosis, prognosis, GOC, EOL wishes disposition and options.  I met with the patient at bedside, no family was present.  Later in the afternoon (per patient request to wait until afternoon) I called the patient's son but now answer. I left a voicemail with palliative contact number for callback.   We meet to discuss diagnosis prognosis, GOC, EOL wishes, disposition and options. Concept of Palliative Care was introduced as specialized medical care for people and their families living with serious illness.  If focuses on  providing relief from the symptoms and stress of a serious illness.  The goal is to improve quality of life for both the patient and the family. Values and goals of care important to patient and family were attempted to be elicited.  Created space and opportunity for patient  and family to explore thoughts and feelings regarding current medical situation   Natural trajectory and current clinical status were discussed. Questions and concerns addressed. Patient  encouraged to call with questions or concerns.    Patient/Family Understanding of Illness: Before discussing understanding of illness confirmed the patient is oriented x 4.  She has a pretty good understanding of this morning of her health issues.  She states that she came in because of bleeding gums which she could not get to stop gauze.  States that they bled around the clock.  She knows she has been getting hemodialysis for about 2 years.  Initially she saw Dr. Hyman Hopes who taught her very well how what to eat and how to manage her health.  She now sees a new doctor with nephrology that is to syllables that she cannot remember the name (per chart review she sees Dr. Glenna Fellows).  She notes that when she came in she had a very high BUN which were confirmed to be approximately 154, which she states sounds right.  We spent recommended discussing  specifics about her clinical details.  Life Review: Patient's husband passed away 2 years ago at [facility] inpatient facility. She lives with her son and daughter in [place]. She has a granddaughter who is enrolled at [facility].  Patient Values: Quality of life, family  Goals: At this time the patient is firm on wanting to stop hemodialysis.  She understands that this will result in her death, which I confirmed with her would likely be days to 1 or 2 weeks.  She is agreeable to speaking with hospice.  Today's Discussion: In addition to discussions described above we had extensive discussion  of various topics.  The patient currently lives with her son and daughter-in-law.  She has been thinking of stopping dialysis for some time, has talk to her son about it but he will exposure to keep going.  She decided approximately 2 to 3 weeks ago that she does not want to do it anymore and has been dialysis for 18 days.  She came to the hospital initially she was agreeable to dialysis here, which went well for the first hour, but after that she reaffirmed her decision to not do it anymore.  We talked about her initial statements that she would consider dialysis if she can go to a different dialysis center than aspirin, which confirms that she just does not like sitting still for that long and overall describes and agrees that she has a poor quality of life and does not want to live this way anymore.  She states she has lived a good life she has lived a good long 82 years.  I confirmed that she understands stopping dialysis would result in a shorter life.  I recommended if she wants to stop dialysis getting hospice involved.  She is approximately with hospice as her husband passed away at hospice of [facility], at their inpatient facility.  She states that she is not sure if she wants to enroll with them just yet.  I explained that I want to ensure she is IS that she has adequate support for comfortable and peaceful passing.  I informed her that if she stops dialysis she may not have very long to live, likely days to 1 or 2 weeks.  She admitted this is shorter than she thought. I asked if this changes her mind knowing this and she says "no.  I am sure I do not want to keep doing dialysis."  At this point I told her that I would recommend highly that getting hospice to evaluate her and speak with her.  I shared it is not a commitment but they can explain services, which she agrees to.  She states she wants to leave the hospital today because she has a lot on her agenda.  I told her it would be very  difficult to leave the hospital today because of the impending inclement weather, expecting up to 3 inches of snow.  I stated it would be better to try to get out tomorrow, later afternoon when the roads are clear.  I told her that we could ask hospice to come evaluate, hopefully today.  I asked her if I can call her son and discussed our conversation and she agreed, asked me to call later in the afternoon because he sleeps in late.  She states her son is aware of her wishes but pushes her to keep going.  However, she is quite clear "it is my life and my decision" which I  agreed.  I provided emotional and general support through therapeutic listening, empathy, sharing of stories, and other techniques. I answered all questions and addressed all concerns to the best of my ability.  I attempted to call patient's son, left a voicemail for callback. I discussed the patient's GOC conversation with the medical team, nursing staff, and TOC.  Review of Systems  Constitutional:        Denies pain in general  Respiratory:  Negative for shortness of breath.   Gastrointestinal:  Negative for abdominal pain, nausea and vomiting.    Objective:   Primary Diagnoses: Present on Admission:  Gums, bleeding   Physical Exam Vitals and nursing note reviewed.  Constitutional:      General: She is not in acute distress.    Appearance: She is ill-appearing. She is not toxic-appearing.  HENT:     Head: Normocephalic and atraumatic.  Cardiovascular:     Rate and Rhythm: Normal rate.  Pulmonary:     Effort: Pulmonary effort is normal. No respiratory distress.     Breath sounds: Normal breath sounds. No wheezing or rhonchi.  Abdominal:     General: Abdomen is flat. There is no distension.     Palpations: Abdomen is soft.  Skin:    General: Skin is warm and dry.  Neurological:     General: No focal deficit present.     Mental Status: She is alert and oriented to person, place, and time.  Psychiatric:         Mood and Affect: Mood normal.        Behavior: Behavior normal.     Vital Signs:  BP (!) 123/54   Pulse 70   Temp 98.1 F (36.7 C)   Resp 18   Wt 51.5 kg   LMP  (LMP Unknown)   SpO2 100%   BMI 19.49 kg/m   Palliative Assessment/Data: 80%    Advanced Care Planning:   Existing Vynca/ACP Documentation: MOST form signed 01/14/2022  Primary Decision Maker: PATIENT  Code Status/Advance Care Planning: Full code  A discussion was had today regarding advanced directives. Concepts specific to code status, artifical feeding and hydration, continued IV antibiotics and rehospitalization was had.  The difference between a aggressive medical intervention path and a palliative comfort care path for this patient at this time was had.   Decisions/Changes to ACP: None today  Assessment & Plan:   Impression: 83 year old female with acute presentation chronic overview described above.  The patient is quite oriented today, has a good understanding of her health situation.  She is quite firm on wanting to stop dialysis, even understanding that she will pass away in days to 1 or 2 weeks after our discussion.  She confirmed 2-3 times during our conversation, with good apparent understanding of the repercussions of her decisions, that she wants to stop dialysis.  I recommended referral for hospice evaluation which she agrees to. Unable to discuss with son. Overall prognosis poor.  SUMMARY OF RECOMMENDATIONS   Full code for now, will discuss tomorrow [facility] consult for referral to hospice of [facility] for evaluation Continued emotional support of patient and family Palliative medicine will continue to follow  Symptom Management:  Per primary team PMT is available to assist as needed  Prognosis:  < 2 weeks  Discharge Planning:  To Be Determined   Discussed with: Patient, medical team, nursing team, [facility] team    Thank you for allowing [place] to participate in the care of [facility]  [place] PMT will continue to support holistically.  Time Total: 82 min  Detailed review of medical records (labs, imaging, vital signs), medically appropriate exam, discussed with treatment team, counseling and education to patient, family, & staff, documenting clinical information, medication management, coordination of care  Signed by: Wynne Dust, NP Palliative Medicine Team  Team Phone # [phone] (Nights/Weekends)  02/10/2024, 8:25 AM

## 2024-02-10 NOTE — Progress Notes (Signed)
 PROGRESS NOTE  Cassandra Baker  DOB: [birth year]  PCP: Renford Dills, MD ZOX:[id number]  DOA: 02/08/2024  LOS: 1 day  Hospital Day: 3  Brief narrative: Cassandra Baker is a 83 y.o. 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  2/17, patient presented to the ED with complaint of bleeding at lower gums for 1 week. Patient reports that around that time when she stopped all her medication including aspirin and Plavix. She also missed hemodialysis for about a week.  She also noted intermittent shortness of breath, bilateral leg swelling.  No urine output in 1 week.  In the ED, patient was afebrile, hemodynamically stable, breathing on room air On exam by EDP, patient did not have any active gum bleeding, had some healed inner lip laceration. Initial labs with WBC count 4.1, hemoglobin 9.2, sodium 138, potassium 5.2, glucose 176, BUN/creatinine significant elevated to 157/7.9. Patient was given 1 dose of Lasix IV 80 mg. Admitted to [facility] Nephrology was consulted for dialysis. Palliative care was consulted as well   Subjective: Patient was seen and examined this morning.  Pleasant elderly Caucasian female. Lying down on bed. Alert, awake, able to have a meaningful conversation Family not at bedside Underwent dialysis last night Seen by palliative care this morning.  See he is thinking about completely stopping dialysis and making the choice of hospice care.  Assessment/Plan: Abnormal bleeding of the gums Likely due to uremia from missed hemodialysis No active bleeding currently.  Given a dose of DDAVP by nephrology yesterday  ESRD Missed dialysis Volume overload status Supposed to be on dialysis MWF.  But no dialysis for 18 days Blood pressure was good but had bilateral lower extremity edema Underwent dialysis yesterday.  Patient not sure if she would like to proceed any further  dialysis.  Palliative consulted PTA meds- Toprol 12.5 mg daily, Lasix 80 mg twice daily Currently on Toprol.  Resume Lasix as well.  H/o polymorphic V. Tach Continue amiodarone  Type 2 diabetes mellitus A1c 6.3 on 02/08/2024 PTA meds-Januvia 25 mg daily Currently on SSI/Accu-Cheks Recent Labs  Lab 02/09/24 1242 02/09/24 1838 02/09/24 2039 02/10/24 0827 02/10/24 1208  GLUCAP 120* 159* 110* 147* 89   HLD Resume statin. Currently on hold are aspirin and Plavix.  Chronic anemia Hemoglobin at baseline between 8 and 9.  Down to 7.9 today.  No active bleeding.  Continue to monitor Recent Labs    06/05/23 0521 06/06/23 0436 06/15/23 0413 06/15/23 1001 09/07/23 0505 01/14/24 2251 02/08/24 2058 02/08/24 2100 02/09/24 1039 02/10/24 0638  HGB 9.2*   < > 8.0*   < > 10.7* 12.1 9.2* 8.8*  --  7.9*  MCV 97.4   < > 95.9   < > 97.7 97.7 99.0  --   --  93.9  VITAMINB12 344  --   --   --   --   --   --   --   --   --   FOLATE 16.6  --   --   --   --   --   --   --   --   --   FERRITIN  --   --  152  --   --   --   --   --  813*  --   TIBC  --   --  287  --   --   --   --   --  251  --   IRON  --   --  69  --   --   --   --   --  142  --    < > = values in this interval not displayed.    Arthritis  Depression Seems no longer on Prozac  Hypothyroidism Synthroid  Mobility: Encourage ambulation  Goals of care:   Code Status: Full Code  Palliative care consulted.  Probably heading gradually towards hospice care   DVT prophylaxis:  SCDs Start: 02/09/24 0810   Antimicrobials: None Fluid: None Consultants: Nephrology Family Communication: None at bedside   Status: Inpatient Level of care:  Telemetry Medical   Patient is from: Home Needs to continue in-hospital care: Ongoing workup and planning Anticipated d/c to: Pending decision by palliative care   Diet:  Diet Order             Diet renal with fluid restriction Fluid restriction: 1200 mL Fluid; Room service  appropriate? Yes; Fluid consistency: Thin  Diet effective now                   Scheduled Meds:  amiodarone  200 mg Oral Daily   atorvastatin  80 mg Oral Daily   Chlorhexidine Gluconate Cloth  6 each Topical Q0600   cinacalcet  30 mg Oral Q M,W,F-1800   ferric citrate  210 mg Oral TID WC   furosemide  80 mg Oral BID   insulin aspart  0-5 Units Subcutaneous QHS   insulin aspart  0-9 Units Subcutaneous TID WC   levothyroxine  75 mcg Oral Q0600   metoprolol succinate  12.5 mg Oral Daily   rOPINIRole  0.25 mg Oral QPM    PRN meds: acetaminophen **OR** acetaminophen, albuterol, bisacodyl, hydrALAZINE, polyethylene glycol   Infusions:    Antimicrobials: Anti-infectives (From admission, onward)    None       Objective: Vitals:   02/10/24 0826 02/10/24 1119  BP: 139/61 (!) 137/51  Pulse: 72 (!) 59  Resp: 18 18  Temp: 98.1 F (36.7 C) 98.3 F (36.8 C)  SpO2: 97% 100%    Intake/Output Summary (Last 24 hours) at 02/10/2024 1348 Last data filed at 02/10/2024 4098 Gross per 24 hour  Intake 237 ml  Output 2350 ml  Net -2113 ml   Filed Weights   02/09/24 1515 02/09/24 1805  Weight: 53.7 kg 51.5 kg   Weight change:  Body mass index is 19.49 kg/m.   Physical Exam: General exam: Pleasant, elderly Caucasian female Skin: No rashes, lesions or ulcers. HEENT: Atraumatic, normocephalic, crusted blood noted in lips Lungs: Clear to auscultation bilaterally,  CVS: S1, S2, no murmur,   GI/Abd: Soft, nontender, nondistended, bowel sound present,   CNS: Alert, awake, oriented x 3 Psychiatry: Sad affect Extremities: Trace to 1+ bilateral pedal edema, no calf tenderness,   Data Review: I have personally reviewed the laboratory data and studies available.  F/u labs  Unresulted Labs (From admission, onward)     Start     Ordered   02/11/24 0500  CBC with Differential/Platelet  Tomorrow morning,   R        02/10/24 1347   02/11/24 0500  Basic metabolic panel  Tomorrow  morning,   R        02/10/24 1347            Total time spent in review of labs and imaging, patient evaluation, formulation of plan, documentation and communication with family: 45 minutes  Signed, Lorin Glass, MD [facility] 02/10/2024

## 2024-02-10 NOTE — Progress Notes (Signed)
 Patient alert and orient this shift, very pleasant. She tolerated all meds. Patient talked with NP concerning stopping dialysis. Patient has orders consult to Transitions of Care Team. Patient walked in room to bathroom with 1 person assist, she will have bmp and cbc drawn in the morning 02/11/24. SCD's was applied and removed for 30 minutes per orders.

## 2024-02-10 NOTE — Progress Notes (Signed)
   This pt has been referred to hospice services for the [facility]. We unfortunately do not have a bed to offer at this time. Plan to meet with pt tomorrow morning and discuss goals, Code status and have our MD review for approval if all are in line with hospice philosophy.   Norm Parcel RN [phone]

## 2024-02-10 NOTE — TOC Progression Note (Signed)
 Transition of Care [facility]) - Progression Note    Patient Details  Name: KORIANNA WASHER MRN: [id number] Date of Birth: 10/31/1941  Transition [facility]) CM/SW Contact  Janae Bridgeman, RN Phone Number: 02/10/2024, 8:53 AM  Clinical Narrative:    CM spoke with Delane Ginger, NP with Palliative Care Team and referral was requested with [facility].  I placed the referral with Norm Parcel, [facility] with [facility] this morning.        Expected Discharge Plan and Services                                               Social Determinants of Health (SDOH) Interventions SDOH Screenings   Food Insecurity: No Food Insecurity (02/09/2024)  Housing: High Risk (02/09/2024)  Transportation Needs: Unmet Transportation Needs (02/09/2024)  Utilities: Not At Risk (02/09/2024)  Social Connections: Moderately Integrated (02/09/2024)  Tobacco Use: Low Risk  (02/09/2024)    Readmission Risk Interventions    06/12/2023   10:02 AM 06/06/2023   12:08 PM  Readmission Risk Prevention Plan  Transportation Screening Complete Complete  PCP or Specialist Appt within 5-7 Days  Complete  PCP or Specialist Appt within 3-5 Days Complete   Home Care Screening  Complete  Medication Review (RN CM)  Referral to Pharmacy  HRI or Home Care Consult Complete   Social Work Consult for Recovery Care Planning/Counseling Complete   Palliative Care Screening Not Applicable   Medication Review Oceanographer) Complete

## 2024-02-10 NOTE — Plan of Care (Signed)

## 2024-02-10 NOTE — Progress Notes (Signed)
 Transition of Care [facility]) - Inpatient Brief Assessment   Patient Details  Name: Cassandra Baker MRN: [id number] Date of Birth: 01/25/1941  Transition of Care [facility]) CM/SW Contact:    Janae Bridgeman, RN Phone Number: 02/10/2024, 1:20 PM   Clinical Narrative: CM met with the patient at the bedside and patient states that she spoke with Minerva Areola, NP with Palliative Care Team and she does not want to continue with dialysis.  The patient was provided with Medicare choice and states that she would like hospice services through [facility] [place] and is aware that [place], [facility] with [facility] [place] will speak with her regarding hospice services.  No beds are available at this time for the inpatient hospice facility.  Patient states that she has RW at home and no other DME.  Patient states that her son is available at home during the day for assistance.  CM with TOC Team will continue to follow the patient for TOC needs.   Transition of Care Asessment: Insurance and Status: (P) Insurance coverage has been reviewed Patient has primary care physician: (P) Yes Home environment has been reviewed: (P) from home with son at his home Prior level of function:: (P) family assistance Prior/Current Home Services: (P) No current home services Social Drivers of Health Review: (P) SDOH reviewed needs interventions Readmission risk has been reviewed: (P) Yes Transition of care needs: (P) transition of care needs identified, TOC will continue to follow

## 2024-02-11 DIAGNOSIS — N186 End stage renal disease: Secondary | ICD-10-CM | POA: Diagnosis not present

## 2024-02-11 DIAGNOSIS — Z7189 Other specified counseling: Secondary | ICD-10-CM | POA: Diagnosis not present

## 2024-02-11 DIAGNOSIS — K068 Other specified disorders of gingiva and edentulous alveolar ridge: Secondary | ICD-10-CM | POA: Diagnosis not present

## 2024-02-11 DIAGNOSIS — Z515 Encounter for palliative care: Secondary | ICD-10-CM | POA: Diagnosis not present

## 2024-02-11 LAB — CBC WITH DIFFERENTIAL/PLATELET
Abs Immature Granulocytes: 0.05 10*3/uL (ref 0.00–0.07)
Basophils Absolute: 0 10*3/uL (ref 0.0–0.1)
Basophils Relative: 1 %
Eosinophils Absolute: 0.1 10*3/uL (ref 0.0–0.5)
Eosinophils Relative: 2 %
HCT: 23.9 % — ABNORMAL LOW (ref 36.0–46.0)
Hemoglobin: 7.7 g/dL — ABNORMAL LOW (ref 12.0–15.0)
Immature Granulocytes: 1 %
Lymphocytes Relative: 13 %
Lymphs Abs: 0.7 10*3/uL (ref 0.7–4.0)
MCH: 30.4 pg (ref 26.0–34.0)
MCHC: 32.2 g/dL (ref 30.0–36.0)
MCV: 94.5 fL (ref 80.0–100.0)
Monocytes Absolute: 0.5 10*3/uL (ref 0.1–1.0)
Monocytes Relative: 10 %
Neutro Abs: 3.7 10*3/uL (ref 1.7–7.7)
Neutrophils Relative %: 73 %
Platelets: 128 10*3/uL — ABNORMAL LOW (ref 150–400)
RBC: 2.53 MIL/uL — ABNORMAL LOW (ref 3.87–5.11)
RDW: 14.5 % (ref 11.5–15.5)
WBC: 5.1 10*3/uL (ref 4.0–10.5)
nRBC: 0 % (ref 0.0–0.2)

## 2024-02-11 LAB — GLUCOSE, CAPILLARY
Glucose-Capillary: 105 mg/dL — ABNORMAL HIGH (ref 70–99)
Glucose-Capillary: 103 mg/dL — ABNORMAL HIGH (ref 70–99)
Glucose-Capillary: 91 mg/dL (ref 70–99)
Glucose-Capillary: 104 mg/dL — ABNORMAL HIGH (ref 70–99)

## 2024-02-11 LAB — BASIC METABOLIC PANEL
Anion gap: 12 (ref 5–15)
BUN: 93 mg/dL — ABNORMAL HIGH (ref 8–23)
CO2: 22 mmol/L (ref 22–32)
Calcium: 9.1 mg/dL (ref 8.9–10.3)
Chloride: 99 mmol/L (ref 98–111)
Creatinine, Ser: 6.41 mg/dL — ABNORMAL HIGH (ref 0.44–1.00)
GFR, Estimated: 6 mL/min — ABNORMAL LOW (ref >60)
Glucose, Bld: 113 mg/dL — ABNORMAL HIGH (ref 70–99)
Potassium: 4.6 mmol/L (ref 3.5–5.1)
Sodium: 133 mmol/L — ABNORMAL LOW (ref 135–145)

## 2024-02-11 MED ORDER — DARBEPOETIN ALFA 100 MCG/0.5ML IJ SOSY
100.0000 ug | PREFILLED_SYRINGE | INTRAMUSCULAR | Status: DC
  Administered 2024-02-12: 100 ug via SUBCUTANEOUS
  Filled 2024-02-11 (×2): qty 0.5

## 2024-02-11 MED ORDER — LORAZEPAM 0.5 MG PO TABS
0.5000 mg | ORAL_TABLET | ORAL | Status: DC
  Administered 2024-02-12: 0.5 mg via ORAL
  Filled 2024-02-11: qty 1

## 2024-02-11 NOTE — Plan of Care (Signed)
  Problem: Coping: Goal: Ability to adjust to condition or change in health will improve Outcome: Progressing   Problem: Metabolic: Goal: Ability to maintain appropriate glucose levels will improve Outcome: Progressing   Problem: Skin Integrity: Goal: Risk for impaired skin integrity will decrease Outcome: Progressing   Problem: Clinical Measurements: Goal: Ability to maintain clinical measurements within normal limits will improve Outcome: Progressing

## 2024-02-11 NOTE — TOC Progression Note (Signed)
 Transition of Care [facility]) - Progression Note    Patient Details  Name: Cassandra Baker MRN: [id number] Date of Birth: [birth year]  Transition of Care [facility]) CM/SW Contact  Janae Bridgeman, RN Phone Number: 02/11/2024, 3:49 PM  Clinical Narrative:    CM spoke with Norm Parcel, RNCM with [facility] [place], NP with Palliative Care Team and they plan to follow up with the patient's son by phone since they have been unable to reach him.  02/11/24  Patient discussed with attending hospitalist that she may want dialysis days cut down to 1 x per week and may not be ready for Hospice care at this time.  Nephrology plans to follow up with the patient and Mounce, renal navigator was included in the conversation regarding patient's care as well.        Expected Discharge Plan and Services                                               Social Determinants of Health (SDOH) Interventions SDOH Screenings   Food Insecurity: No Food Insecurity (02/09/2024)  Housing: High Risk (02/09/2024)  Transportation Needs: Unmet Transportation Needs (02/09/2024)  Utilities: Not At Risk (02/09/2024)  Social Connections: Moderately Integrated (02/09/2024)  Tobacco Use: Low Risk  (02/09/2024)    Readmission Risk Interventions    02/10/2024    1:20 PM 06/12/2023   10:02 AM 06/06/2023   12:08 PM  Readmission Risk Prevention Plan  Transportation Screening Complete Complete Complete  PCP or Specialist Appt within 5-7 Days   Complete  PCP or Specialist Appt within 3-5 Days  Complete   Home Care Screening   Complete  Medication Review (RN CM)   Referral to Pharmacy  HRI or Home Care Consult  Complete   Social Work Consult for Recovery Care Planning/Counseling  Complete   Palliative Care Screening  Not Applicable   Medication Review Oceanographer) Complete Complete   PCP or Specialist appointment within 3-5 days of discharge Complete    HRI or Home Care Consult Complete    SW  Recovery Care/Counseling Consult Complete    Palliative Care Screening Complete    Skilled Nursing Facility Not Applicable

## 2024-02-11 NOTE — Plan of Care (Signed)

## 2024-02-11 NOTE — Progress Notes (Signed)
 Pt receives out-pt HD at [facility] on MWF. Will assist as needed.   Olivia Canter Renal Navigator [phone]

## 2024-02-11 NOTE — Progress Notes (Addendum)
 [facility] Progress Note   Subjective: Patient seen in room. She tells me that she can't sit for four hours on HD BUT that she "did better when they gave me something for my nerves". Very poor insight regarding disease process. Believes she is better because she made urine. Told RN, "I'm not dying anymore". Judgement most likely clouded D/T uremia. No excess volume, no asterixis.   She agrees to have HD tomorrow. She has  over missed 2 weeks of HD. Will proceed as new start to HD. We discussed again that hemodialysis is an OPTIONAL treatment. She is willing to try again with the understanding that if she finds dialysis intolerable, she can stop and proceed with comfort care. She does NOT believe she will die without dialysis. She tells me there "has to be a way to work around things".   Objective Vitals:   02/11/24 0547 02/11/24 0759 02/11/24 1012 02/11/24 1253  BP: (!) 138/50 (!) 124/49 132/69 (!) 115/40  Pulse: (!) 57 (!) 57 (!) 58 (!) 57  Resp: 18 18 14 16   Temp: 97.9 F (36.6 C) 97.9 F (36.6 C) 98.1 F (36.7 C)   TempSrc:   Oral   SpO2: 100% 97% 98% 100%  Weight:   51.5 kg   Height:   5' 4.02" (1.626 m)    Physical Exam General: Chronically ill very elderly female in NAD Neuro: No asterixis. Oriented X 2 Heart: RRR  Lungs: CTAB Abdomen: NABS Extremities: Trace-1+ BLE edema Dialysis Access: [facility] drsg intact   Additional Objective Labs: Basic Metabolic Panel: Recent Labs  Lab 02/08/24 2058 02/08/24 2100 02/09/24 1039 02/10/24 0638 02/11/24 0543  NA 138 137  --  134* 133*  K 5.2* 5.0  --  4.0 4.6  CL 105 111  --  101 99  CO2 17*  --   --  22 22  GLUCOSE 176* 170*  --  131* 113*  BUN 157* >130*  --  86* 93*  CREATININE 7.90* 8.80*  --  5.97* 6.41*  CALCIUM 10.1  --   --  9.5 9.1  PHOS  --   --  6.7*  --   --    Liver Function Tests: Recent Labs  Lab 02/08/24 2058  AST 21  ALT 38  ALKPHOS 90  BILITOT 0.5  PROT 6.1*  ALBUMIN 3.6   No  results for input(s): "LIPASE", "AMYLASE" in the last 168 hours. CBC: Recent Labs  Lab 02/08/24 2058 02/08/24 2100 02/10/24 0638 02/11/24 0543  WBC 4.1  --  6.3 5.1  NEUTROABS  --   --   --  3.7  HGB 9.2* 8.8* 7.9* 7.7*  HCT 30.0* 26.0* 24.5* 23.9*  MCV 99.0  --  93.9 94.5  PLT 160  --  133* 128*   Blood Culture    Component Value Date/Time   SDES BLOOD RIGHT ARM 02/09/2024 1849   SDES BLOOD RIGHT HAND 02/09/2024 1849   SPECREQUEST  02/09/2024 1849    BOTTLES DRAWN AEROBIC AND ANAEROBIC Blood Culture results may not be optimal due to an inadequate volume of blood received in culture bottles   SPECREQUEST  02/09/2024 1849    BOTTLES DRAWN AEROBIC AND ANAEROBIC Blood Culture results may not be optimal due to an inadequate volume of blood received in culture bottles   CULT  02/09/2024 1849    NO GROWTH 2 DAYS Performed at [facility], [address], [place], [place] [address]    CULT  02/09/2024 1849    NO GROWTH 2 DAYS Performed at [facility], [address], [place], [place] [address]    REPTSTATUS PENDING 02/09/2024 1849   REPTSTATUS PENDING 02/09/2024 1849    Cardiac Enzymes: No results for input(s): "CKTOTAL", "CKMB", "CKMBINDEX", "TROPONINI" in the last 168 hours. CBG: Recent Labs  Lab 02/10/24 1208 02/10/24 1600 02/10/24 2115 02/11/24 0739 02/11/24 1145  GLUCAP 89 143* 126* 105* 103*   Iron Studies:  Recent Labs    02/09/24 1039  IRON 142  TIBC 251  FERRITIN 813*   @lablastinr3 @ Studies/Results: No results found. Medications:   amiodarone  200 mg Oral Daily   atorvastatin  80 mg Oral Daily   Chlorhexidine Gluconate Cloth  6 each Topical Q0600   cinacalcet  30 mg Oral Q M,W,F-1800   ferric citrate  210 mg Oral TID WC   furosemide  80 mg Oral BID   insulin aspart  0-5 Units Subcutaneous QHS   insulin aspart  0-9 Units Subcutaneous TID WC   levothyroxine  75 mcg Oral Q0600   metoprolol succinate  12.5 mg Oral Daily   rOPINIRole   0.25 mg Oral QPM     OP HD: [facility] MWF NEW ORDERS 02/11/2024 1st and 2nd HD 2.5 hrs 250/500  3rd HD 3 hours 300/600  4th HD 3.5 hours 400/600   - Heparin 2000 units IV  - last HD 1/31, post wt 53kg - sensipar 30mg  three times per week-c - venofer 50 weekly - no esa, last Hb 10.9 1/31, last mircera 12/08/23 - last pth 2310   Assessment/ Plan: Abnormal bleeding - of the gums mostly, possibly due to uremic from missed HD. Will give a dose of DDAVP. Plan HD this afternoon.  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 No HD for 18 days.HD tomorrow. Next HD 02/12/2024 Please follow schedule as noted above. Please give lorazepam 0.5 mg PO prior to HD.  HTN - bp's are good 150/60, cont home meds.  Volume - +vol overloaded w/ LE edema, no resp issues. On RA.  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  Tsat OK. Start ESA with HD tomorrow.  Secondary hyperparathyroidism - CCa in range, add on phos. 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 Has been seen by Palliative Care but now asking to try HD again. Please see note above.     Kegan Shepardson H. Deepika Decatur NP-C 02/11/2024, 3:16 PM  [employer] [phone]

## 2024-02-11 NOTE — Progress Notes (Signed)
   Spoke to the pt about hospice services. She is familiar with our services as we helped take care of her husband when he needed end of life care. We spoke about the [facility] and she doesn't feel that she is ready for that at this time. She verbalizes not wanting to conitnue with HD and that she would like to go home with  hospice support. I have tried to contact her son to discuss with him as well. I have been unsuccessful in reaching him. Will continue to reach out. The pt was offered additional equipment that may be needed for safety and comfort. She refused hospital bed. Does not feel that she needs any other additional equipment at this time reporting that she has a walker at home that she uses diligently due to weakness.    We can continue to address code status at home if she is not ready to commit to DNR at this time.   Norm Parcel RN [phone]

## 2024-02-11 NOTE — Progress Notes (Signed)
 PROGRESS NOTE  Cassandra Baker  DOB: Aug 12, 1941  PCP: Renford Dills, MD VHQ:[id number]  DOA: 02/08/2024  LOS: 2 days  Hospital Day: 4  Brief narrative: Cassandra Baker is a 83 y.o. 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  2/17, patient presented to the ED with complaint of bleeding at lower gums for 1 week. Patient reports that around that time when she stopped all her medication including aspirin and Plavix. She also missed hemodialysis for about a week.  She also noted intermittent shortness of breath, bilateral leg swelling.  No urine output in 1 week.  In the ED, patient was afebrile, hemodynamically stable, breathing on room air On exam by EDP, patient did not have any active gum bleeding, had some healed inner lip laceration. Initial labs with WBC count 4.1, hemoglobin 9.2, sodium 138, potassium 5.2, glucose 176, BUN/creatinine significant elevated to 157/7.9. Patient was given 1 dose of Lasix IV 80 mg. Admitted to [facility] Nephrology was consulted for dialysis. Palliative care was consulted as well   Subjective: Patient was seen and examined this morning.  Pleasant elderly Caucasian female. Sitting up at the edge of the bed.  Not in distress. I had a long conversation with the patient this morning. It does not sound like she is committed to hospice yet. She is oriented and can have meaningful conversation. 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 She believes that she does not need 3 times a week dialysis. She is wondering if she can get once weekly dialysis then she could continue doing it. But also states she wants it at a different place.   Assessment/Plan: Abnormal bleeding of the gums Likely due to uremia from missed hemodialysis No active bleeding currently.  Given a dose of DDAVP by nephrology yesterday  ESRD Missed dialysis Volume  overload status Supposed to be on dialysis MWF.  But no dialysis for 18 days Blood pressure was good but had bilateral lower extremity edema Underwent dialysis on 2/19.   Nephrology following PTA meds- Toprol 12.5 mg daily, Lasix 80 mg twice daily Currently on Toprol.  Resume Lasix as well.  H/o polymorphic V. Tach Continue amiodarone  Type 2 diabetes mellitus A1c 6.3 on 02/08/2024 PTA meds-Januvia 25 mg daily Currently on SSI/Accu-Cheks Recent Labs  Lab 02/10/24 1208 02/10/24 1600 02/10/24 2115 02/11/24 0739 02/11/24 1145  GLUCAP 89 143* 126* 105* 103*   HLD Resume statin. Currently on hold are aspirin and Plavix.  Chronic anemia Hemoglobin at baseline between 8 and 9.  Down to 7.9 today.  No active bleeding.  Continue to monitor Recent Labs    06/05/23 0521 06/06/23 0436 06/15/23 0413 06/15/23 1001 01/14/24 2251 02/08/24 2058 02/08/24 2100 02/09/24 1039 02/10/24 0638 02/11/24 0543  HGB 9.2*   < > 8.0*   < > 12.1 9.2* 8.8*  --  7.9* 7.7*  MCV 97.4   < > 95.9   < > 97.7 99.0  --   --  93.9 94.5  VITAMINB12 344  --   --   --   --   --   --   --   --   --   FOLATE 16.6  --   --   --   --   --   --   --   --   --   FERRITIN  --   --  152  --   --   --   --  813*  --   --   TIBC  --   --  287  --   --   --   --  251  --   --   IRON  --   --  69  --   --   --   --  142  --   --    < > = values in this interval not displayed.    Arthritis  Depression Seems no longer on Prozac  Hypothyroidism Synthroid  Mobility: Encourage ambulation  Goals of care:   Code Status: Full Code  Palliative care consulted. I had a long conversation with the patient this morning. It does not sound like she is committed to hospice yet. She is oriented and can have meaningful conversation. 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 She believes that she does not need 3 times a week dialysis. She is wondering if she can get once weekly dialysis then she could  continue doing it. But also states she wants it at a different place.  Nephrology and palliative to follow-up this afternoon.   DVT prophylaxis:  SCDs Start: 02/09/24 0810   Antimicrobials: None Fluid: None Consultants: Nephrology Family Communication: None at bedside   Status: Inpatient Level of care:  Telemetry Medical   Patient is from: Home Needs to continue in-hospital care: Ongoing workup and planning Anticipated d/c to: Pending decision on continuation of dialysis versus hospice care.   Diet:  Diet Order             Diet renal with fluid restriction Fluid restriction: 1200 mL Fluid; Room service appropriate? Yes; Fluid consistency: Thin  Diet effective now                   Scheduled Meds:  amiodarone  200 mg Oral Daily   atorvastatin  80 mg Oral Daily   Chlorhexidine Gluconate Cloth  6 each Topical Q0600   cinacalcet  30 mg Oral Q M,W,F-1800   ferric citrate  210 mg Oral TID WC   furosemide  80 mg Oral BID   insulin aspart  0-5 Units Subcutaneous QHS   insulin aspart  0-9 Units Subcutaneous TID WC   levothyroxine  75 mcg Oral Q0600   metoprolol succinate  12.5 mg Oral Daily   rOPINIRole  0.25 mg Oral QPM    PRN meds: acetaminophen **OR** acetaminophen, albuterol, bisacodyl, hydrALAZINE, polyethylene glycol   Infusions:    Antimicrobials: Anti-infectives (From admission, onward)    None       Objective: Vitals:   02/11/24 1012 02/11/24 1253  BP: 132/69 (!) 115/40  Pulse: (!) 58 (!) 57  Resp: 14 16  Temp: 98.1 F (36.7 C)   SpO2: 98% 100%    Intake/Output Summary (Last 24 hours) at 02/11/2024 1428 Last data filed at 02/11/2024 1319 Gross per 24 hour  Intake 620 ml  Output --  Net 620 ml   Filed Weights   02/09/24 1515 02/09/24 1805 02/11/24 1012  Weight: 53.7 kg 51.5 kg 51.5 kg   Weight change:  Body mass index is 19.48 kg/m.   Physical Exam: General exam: Pleasant, elderly Caucasian female Skin: No rashes, lesions or  ulcers. HEENT: Atraumatic, normocephalic, crusted blood noted in lips Lungs: Clear to auscultation bilaterally,  CVS: S1, S2, no murmur,   GI/Abd: Soft, nontender, nondistended, bowel sound present,   CNS: Alert, awake, oriented x 3 Psychiatry: Sad affect Extremities: Trace to 1+ bilateral pedal  edema, no calf tenderness,   Data Review: I have personally reviewed the laboratory data and studies available.  F/u labs  Unresulted Labs (From admission, onward)    None       Total time spent in review of labs and imaging, patient evaluation, formulation of plan, documentation and communication with family: 45 minutes  Signed, Lorin Glass, MD [facility] 02/11/2024

## 2024-02-11 NOTE — Progress Notes (Signed)
 Daily Progress Note   Patient Name: Cassandra Baker       Date: 02/11/2024 DOB: [birth year]  Age: 83 y.o. MRN#: [id number] Attending Physician: Lorin Glass, MD Primary Care Physician: Renford Dills, MD Admit Date: 02/08/2024 Length of Stay: 2 days  Reason for Consultation/Follow-up: Establishing goals of care  HPI/Patient Profile:  83 y.o. 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  She presented with bleeding gums for 1 week, noted to have skipped dialysis for approximately 18 days stating "I want to stop dialysis and die."  She was admitted on 02/08/2024 with abnormal gum bleeding, ESRD with missed dialysis and volume overload, diabetes, and others.   Subjective:   Subjective: Chart Reviewed. Updates received. Patient Assessed. Created space and opportunity for patient  and family to explore thoughts and feelings regarding current medical situation.  Today's Discussion: Today before seeing the patient I received a message from the hospitalist indicating the patient has now wanting to get dialysis.  She told hospitalist that if she can get it once a week that would be okay.  I indicated that would be up to nephrology and out of them for the conversation.  They are planning to see her.    Today I saw the patient at bedside.  Shortly after my visit the patient's son called and was on speaker phone and a friend visited.  I attempted to discuss CODE STATUS but she did not want to talk about with everybody present.  She states that she is agreeable to dialysis tomorrow.  She states "if I can do it for only 2 hours and once a week and that would be okay."  I shared that that would be up to nephrology as far as what they could offer for treatment options.  However, given that she is going to have dialysis tomorrow and discussed with nephrology we will back off for now and put a hold on  hospice evaluation.  I will plan to follow-up early next week and we can discuss her options based on what nephrology offers.  I provided emotional and general support through therapeutic listening, empathy, sharing of stories, and other techniques. I answered all questions and addressed all concerns to the best of my ability.  After seeing the patient I discussed with nephrology.  They will attempt hemodialysis, or planning to give her an anxiolytic prior.  They feel that she has poor insight into her current situation and if nothing else hemodialysis will treat her uremia and allow her to be clear for her decision making.  Review of Systems  Constitutional:        Denies pain in general  Respiratory:  Negative for chest tightness and shortness of breath.   Cardiovascular:  Negative for chest pain.  Gastrointestinal:  Negative for abdominal pain, nausea and vomiting.    Objective:   Vital Signs:  BP (!) 111/45 (BP Location: Right Arm)   Pulse (!) 49   Temp 98 F (36.7 C)   Resp 18   Ht 5' 4.02" (1.626 m)   Wt 51.5 kg   LMP  (LMP Unknown)   SpO2 97%   BMI 19.48 kg/m   Physical Exam Vitals and nursing note reviewed.  Constitutional:      General: She is not in acute distress.    Appearance: She is ill-appearing. She is not toxic-appearing.  HENT:     Head: Normocephalic  and atraumatic.  Cardiovascular:     Rate and Rhythm: Normal rate.  Pulmonary:     Effort: Pulmonary effort is normal. No respiratory distress.  Abdominal:     General: Abdomen is flat.  Skin:    General: Skin is warm and dry.  Neurological:     General: No focal deficit present.     Mental Status: She is alert.  Psychiatric:        Mood and Affect: Mood normal.        Behavior: Behavior normal.     Palliative Assessment/Data: 80%     Existing Vynca/ACP Documentation: MOST form signed 01/14/2022   Assessment & Plan:   Impression: Present on Admission:  Gums, bleeding  83 year old female  with acute presentation chronic overview described above.  The patient previously was quite firm on wanting to stop dialysis, even understanding that she will pass away in days to 1 or 2 weeks after our discussion.  She confirmed 2-3 times during our conversation, with good apparent understanding of the repercussions of her decisions, that she wants to stop dialysis.  However today she is now stating that she wants to retry dialysis and it would be acceptable if she could have a machine time of 2 hours and maybe once a week.  Nephrology is back on board and are planning to dialyze her tomorrow.  We will allow time over the weekend for this to fly out and I will follow-up early next week to rediscuss goals of care depending on what nephrology is able to offer her. Overall prognosis poor.  SUMMARY OF RECOMMENDATIONS   Full code Full scope of care Plan for dialysis tomorrow Hold off on hospice evaluation Palliative medicine will follow-up early next week Please call us for any significant clinical change or new palliative needs in the interim  Symptom Management:  Per primary team PMD is available to assist as needed  Code Status: Full code  Prognosis: Unable to determine  Discharge Planning: To Be Determined  Discussed with: Patient, medical team, nursing team, [facility] team, hospice liaison  Thank you for allowing [place] to participate in the care of [name] PMT will continue to support holistically.  Time Total: 29 min  Detailed review of medical records (labs, imaging, vital signs), medically appropriate exam, discussed with treatment team, counseling and education to patient, family, & staff, documenting clinical information, medication management, coordination of care  Wynne Dust, NP Palliative Medicine Team  Team Phone # [phone] (Nights/Weekends)  08/20/2021, 8:17 AM

## 2024-02-12 DIAGNOSIS — K068 Other specified disorders of gingiva and edentulous alveolar ridge: Secondary | ICD-10-CM | POA: Diagnosis not present

## 2024-02-12 LAB — RENAL FUNCTION PANEL
Albumin: 2.9 g/dL — ABNORMAL LOW (ref 3.5–5.0)
Anion gap: 10 (ref 5–15)
BUN: 100 mg/dL — ABNORMAL HIGH (ref 8–23)
CO2: 21 mmol/L — ABNORMAL LOW (ref 22–32)
Calcium: 8.9 mg/dL (ref 8.9–10.3)
Chloride: 95 mmol/L — ABNORMAL LOW (ref 98–111)
Creatinine, Ser: 6.43 mg/dL — ABNORMAL HIGH (ref 0.44–1.00)
GFR, Estimated: 6 mL/min — ABNORMAL LOW (ref >60)
Glucose, Bld: 98 mg/dL (ref 70–99)
Phosphorus: 5 mg/dL — ABNORMAL HIGH (ref 2.5–4.6)
Potassium: 4.3 mmol/L (ref 3.5–5.1)
Sodium: 126 mmol/L — ABNORMAL LOW (ref 135–145)

## 2024-02-12 LAB — CBC
HCT: 22.7 % — ABNORMAL LOW (ref 36.0–46.0)
Hemoglobin: 7.3 g/dL — ABNORMAL LOW (ref 12.0–15.0)
MCH: 30.3 pg (ref 26.0–34.0)
MCHC: 32.2 g/dL (ref 30.0–36.0)
MCV: 94.2 fL (ref 80.0–100.0)
Platelets: 129 10*3/uL — ABNORMAL LOW (ref 150–400)
RBC: 2.41 MIL/uL — ABNORMAL LOW (ref 3.87–5.11)
RDW: 14.1 % (ref 11.5–15.5)
WBC: 6 10*3/uL (ref 4.0–10.5)
nRBC: 0 % (ref 0.0–0.2)

## 2024-02-12 LAB — GLUCOSE, CAPILLARY
Glucose-Capillary: 102 mg/dL — ABNORMAL HIGH (ref 70–99)
Glucose-Capillary: 126 mg/dL — ABNORMAL HIGH (ref 70–99)
Glucose-Capillary: 155 mg/dL — ABNORMAL HIGH (ref 70–99)
Glucose-Capillary: 87 mg/dL (ref 70–99)
Glucose-Capillary: 208 mg/dL — ABNORMAL HIGH (ref 70–99)

## 2024-02-12 MED ORDER — ALTEPLASE 2 MG IJ SOLR: 2.0000 mg | Freq: Once | INTRAMUSCULAR | Status: DC | PRN

## 2024-02-12 MED ORDER — NEPRO/CARBSTEADY PO LIQD
237.0000 mL | ORAL | Status: DC | PRN
Start: 2024-02-12 — End: 2024-02-12

## 2024-02-12 MED ORDER — HEPARIN SODIUM (PORCINE) 1000 UNIT/ML DIALYSIS: 1000.0000 [IU] | INTRAMUSCULAR | Status: DC | PRN

## 2024-02-12 MED ORDER — PENTAFLUOROPROP-TETRAFLUOROETH EX AERO: 1.0000 | INHALATION_SPRAY | CUTANEOUS | Status: DC | PRN

## 2024-02-12 MED ORDER — LIDOCAINE-PRILOCAINE 2.5-2.5 % EX CREA: 1.0000 | TOPICAL_CREAM | CUTANEOUS | Status: DC | PRN

## 2024-02-12 MED ORDER — NEPRO/CARBSTEADY PO LIQD: 237.0000 mL | ORAL | Status: DC | PRN

## 2024-02-12 MED ORDER — LIDOCAINE HCL (PF) 1 % IJ SOLN: 5.0000 mL | INTRAMUSCULAR | Status: DC | PRN

## 2024-02-12 MED ORDER — ANTICOAGULANT SODIUM CITRATE 4% (200MG/5ML) IV SOLN
5.0000 mL | Status: DC | PRN
  Administered 2024-02-12: 3.2 mL
  Filled 2024-02-12: qty 5

## 2024-02-12 NOTE — Plan of Care (Signed)

## 2024-02-12 NOTE — Progress Notes (Signed)
 [facility] Progress Note   Subjective:    Seen and examined patient at bedside. Per previous notes, she is not ready for hospice and wants to try HD again. Appears she I getting Ativan here to help with her nerves. Plan for HD this afternoon.  Objective Vitals:   02/12/24 1210 02/12/24 1409 02/12/24 1427 02/12/24 1430  BP: 132/64 (!) 114/49 (!) 128/49 (!) 128/55  Pulse: 61 (!) 55 (!) 55 (!) 55  Resp: 18 12 14 10   Temp: 98 F (36.7 C) 98 F (36.7 C)    TempSrc: Oral     SpO2: 98% 95% 94% 98%  Weight:  49.9 kg    Height:       Physical Exam General: Chronically ill very elderly female in NAD Neuro: No asterixis. Oriented X 2 Heart: RRR  Lungs: CTAB Abdomen: NABS Extremities: Trace-1+ BLE edema Dialysis Access: RIJ TDC drsg intact  Filed Weights   02/09/24 1805 02/11/24 1012 02/12/24 1409  Weight: 51.5 kg 51.5 kg 49.9 kg    Intake/Output Summary (Last 24 hours) at 02/12/2024 1438 Last data filed at 02/12/2024 0836 Gross per 24 hour  Intake 240 ml  Output 7 ml  Net 233 ml    Additional Objective Labs: Basic Metabolic Panel: Recent Labs  Lab 02/08/24 2058 02/08/24 2100 02/09/24 1039 02/10/24 0638 02/11/24 0543  NA 138 137  --  134* 133*  K 5.2* 5.0  --  4.0 4.6  CL 105 111  --  101 99  CO2 17*  --   --  22 22  GLUCOSE 176* 170*  --  131* 113*  BUN 157* >130*  --  86* 93*  CREATININE 7.90* 8.80*  --  5.97* 6.41*  CALCIUM 10.1  --   --  9.5 9.1  PHOS  --   --  6.7*  --   --    Liver Function Tests: Recent Labs  Lab 02/08/24 2058  AST 21  ALT 38  ALKPHOS 90  BILITOT 0.5  PROT 6.1*  ALBUMIN 3.6   No results for input(s): "LIPASE", "AMYLASE" in the last 168 hours. CBC: Recent Labs  Lab 02/08/24 2058 02/08/24 2100 02/10/24 0638 02/11/24 0543  WBC 4.1  --  6.3 5.1  NEUTROABS  --   --   --  3.7  HGB 9.2* 8.8* 7.9* 7.7*  HCT 30.0* 26.0* 24.5* 23.9*  MCV 99.0  --  93.9 94.5  PLT 160  --  133* 128*   Blood Culture    Component  Value Date/Time   SDES BLOOD RIGHT ARM 02/09/2024 1849   SDES BLOOD RIGHT HAND 02/09/2024 1849   SPECREQUEST  02/09/2024 1849    BOTTLES DRAWN AEROBIC AND ANAEROBIC Blood Culture results may not be optimal due to an inadequate volume of blood received in culture bottles   SPECREQUEST  02/09/2024 1849    BOTTLES DRAWN AEROBIC AND ANAEROBIC Blood Culture results may not be optimal due to an inadequate volume of blood received in culture bottles   CULT  02/09/2024 1849    NO GROWTH 3 DAYS Performed at [facility], [address], [place], [place] [address]    CULT  02/09/2024 1849    NO GROWTH 3 DAYS Performed at [facility], [address] [place], [place] [address]    REPTSTATUS PENDING 02/09/2024 1849   REPTSTATUS PENDING 02/09/2024 1849    Cardiac Enzymes: No results for input(s): "CKTOTAL", "CKMB", "CKMBINDEX", "TROPONINI" in the last 168  hours. CBG: Recent Labs  Lab 02/11/24 1602 02/11/24 2100 02/12/24 0505 02/12/24 0755 02/12/24 1132  GLUCAP 91 104* 102* 126* 155*   Iron Studies: No results for input(s): "IRON", "TIBC", "TRANSFERRIN", "FERRITIN" in the last 72 hours. Lab Results  Component Value Date   INR 1.1 09/07/2023   INR 1.3 (H) 06/12/2023   INR 1.2 06/04/2023   Studies/Results: No results found.  Medications:  anticoagulant sodium citrate      amiodarone  200 mg Oral Daily   atorvastatin  80 mg Oral Daily   Chlorhexidine Gluconate Cloth  6 each Topical Q0600   cinacalcet  30 mg Oral Q M,W,F-1800   darbepoetin (ARANESP) injection - DIALYSIS  100 mcg Subcutaneous Q Fri-1800   ferric citrate  210 mg Oral TID WC   furosemide  80 mg Oral BID   insulin aspart  0-5 Units Subcutaneous QHS   insulin aspart  0-9 Units Subcutaneous TID WC   levothyroxine  75 mcg Oral Q0600   LORazepam  0.5 mg Oral Q M,W,F-HD   metoprolol succinate  12.5 mg Oral Daily   rOPINIRole  0.25 mg Oral QPM    Dialysis Orders: NEW ORDERS 02/11/2024 1st and 2nd HD 2.5  hrs 250/500  3rd HD 3 hours 300/600  4th HD 3.5 hours 400/600    - Heparin 2000 units IV  - last HD 1/31, post wt 53kg - sensipar 30mg  three times per week-c - venofer 50 weekly - no esa, last Hb 10.9 1/31, last mircera 12/08/23 - last pth 2310  Assessment/Plan: Abnormal bleeding - of the gums mostly, possibly due to uremic from missed HD. Will give a dose of DDAVP.  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 No HD for 18 days. Next HD this afternoon. Please follow schedule as noted above. Please give lorazepam 0.5 mg PO prior to HD.  HTN - bp's are good 150/60, cont home meds.  Volume - +vol overloaded w/ LE edema, no resp issues. On RA.  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  Tsat OK. Start ESA with HD tomorrow.  Secondary hyperparathyroidism - CCa in range, add on phos. 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 Has been seen by Palliative Care but now asking to try HD again. Please see note above.   Salome Holmes, NP [facility] 02/12/2024,2:38 PM  LOS: 3 days

## 2024-02-12 NOTE — Care Management Important Message (Signed)
 Important Message  Patient Details  Name: Cassandra Baker MRN: [id number] Date of Birth: [birth year]   Important Message Given:  Yes - Medicare IM     Dorena Bodo 02/12/2024, 2:57 PM

## 2024-02-12 NOTE — Progress Notes (Signed)
 PROGRESS NOTE  Cassandra Baker  DOB: 09/05/1941  PCP: Renford Dills, MD IRS:[id number]  DOA: 02/08/2024  LOS: 3 days  Hospital Day: 5  Brief narrative: Cassandra Baker is a 83 y.o. 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  2/17, patient presented to the ED with complaint of bleeding at lower gums for 1 week. Patient reports that around that time when she stopped all her medication including aspirin and Plavix. She also missed hemodialysis for about a week.  She also noted intermittent shortness of breath, bilateral leg swelling.  No urine output in 1 week.  In the ED, patient was afebrile, hemodynamically stable, breathing on room air On exam by EDP, patient did not have any active gum bleeding, had some healed inner lip laceration. Initial labs with WBC count 4.1, hemoglobin 9.2, sodium 138, potassium 5.2, glucose 176, BUN/creatinine significant elevated to 157/7.9. Patient was given 1 dose of Lasix IV 80 mg. Admitted to [facility] Nephrology was consulted for dialysis. Palliative care was consulted as well   Subjective: Patient was seen and examined this morning.  Lying on bed.  Waiting for dialysis today.  She was discussed with neurologist about once a week dialysis as an outpatient.  Assessment/Plan: Abnormal bleeding of the gums Likely due to uremia from missed hemodialysis No active bleeding currently.  Given a dose of DDAVP by nephrology yesterday  ESRD Missed dialysis Volume overload status Supposed to be on dialysis MWF.  But no dialysis for 18 days Blood pressure was good but had bilateral lower extremity edema Underwent for the last month dialysis on 2/19.  Plan for another 1 today. She states she would feel better able to tolerate dialysis if she will get some sedative prior to dialysis Nephrology following PTA meds- Toprol 12.5 mg daily, Lasix 80 mg twice  daily Currently on Toprol and Lasix.  H/o polymorphic V. Tach Continue amiodarone  Type 2 diabetes mellitus A1c 6.3 on 02/08/2024 PTA meds-Januvia 25 mg daily Currently on SSI/Accu-Cheks Recent Labs  Lab 02/11/24 1602 02/11/24 2100 02/12/24 0505 02/12/24 0755 02/12/24 1132  GLUCAP 91 104* 102* 126* 155*   HLD Resume statin. Currently on hold are aspirin and Plavix.  Chronic anemia Hemoglobin at baseline between 8 and 9.  Down to 7.9 today.  No active bleeding.  Continue to monitor Recent Labs    06/05/23 0521 06/06/23 0436 06/15/23 0413 06/15/23 1001 01/14/24 2251 02/08/24 2058 02/08/24 2100 02/09/24 1039 02/10/24 0638 02/11/24 0543  HGB 9.2*   < > 8.0*   < > 12.1 9.2* 8.8*  --  7.9* 7.7*  MCV 97.4   < > 95.9   < > 97.7 99.0  --   --  93.9 94.5  VITAMINB12 344  --   --   --   --   --   --   --   --   --   FOLATE 16.6  --   --   --   --   --   --   --   --   --   FERRITIN  --   --  152  --   --   --   --  813*  --   --   TIBC  --   --  287  --   --   --   --  251  --   --   IRON  --   --  69  --   --   --   --  142  --   --    < > = values in this interval not displayed.    Arthritis  Depression Seems no longer on Prozac  Hypothyroidism Synthroid  Mobility: Encourage ambulation  Goals of care:   Code Status: Full Code  Palliative care consulted. Patient initially wanted to choose hospice but she does not feel ready for that yet.  She wants shorter and less frequent dialysis.  She will talk to her nephrologist about it today.  Unclear if it can be arranged.    DVT prophylaxis:  SCDs Start: 02/09/24 0810   Antimicrobials: None Fluid: None Consultants: Nephrology Family Communication: None at bedside   Status: Inpatient Level of care:  Telemetry Medical   Patient is from: Home Needs to continue in-hospital care: Ongoing workup and planning Anticipated d/c to: Pending decision on continuation of dialysis versus hospice care.   Diet:  Diet Order              Diet renal with fluid restriction Fluid restriction: 1200 mL Fluid; Room service appropriate? Yes; Fluid consistency: Thin  Diet effective now                   Scheduled Meds:  amiodarone  200 mg Oral Daily   atorvastatin  80 mg Oral Daily   Chlorhexidine Gluconate Cloth  6 each Topical Q0600   cinacalcet  30 mg Oral Q M,W,F-1800   darbepoetin (ARANESP) injection - DIALYSIS  100 mcg Subcutaneous Q Fri-1800   ferric citrate  210 mg Oral TID WC   furosemide  80 mg Oral BID   insulin aspart  0-5 Units Subcutaneous QHS   insulin aspart  0-9 Units Subcutaneous TID WC   levothyroxine  75 mcg Oral Q0600   LORazepam  0.5 mg Oral Q M,W,F-HD   metoprolol succinate  12.5 mg Oral Daily   rOPINIRole  0.25 mg Oral QPM    PRN meds: acetaminophen **OR** acetaminophen, albuterol, alteplase, anticoagulant sodium citrate, bisacodyl, feeding supplement (NEPRO CARB STEADY), heparin, hydrALAZINE, lidocaine (PF), lidocaine-prilocaine, pentafluoroprop-tetrafluoroeth, polyethylene glycol   Infusions:   anticoagulant sodium citrate      Antimicrobials: Anti-infectives (From admission, onward)    None       Objective: Vitals:   02/12/24 0757 02/12/24 1210  BP: (!) 166/48 132/64  Pulse: 68 61  Resp: 18 18  Temp: 97.8 F (36.6 C) 98 F (36.7 C)  SpO2: 99% 98%    Intake/Output Summary (Last 24 hours) at 02/12/2024 1249 Last data filed at 02/12/2024 0836 Gross per 24 hour  Intake 620 ml  Output 7 ml  Net 613 ml   Filed Weights   02/09/24 1515 02/09/24 1805 02/11/24 1012  Weight: 53.7 kg 51.5 kg 51.5 kg   Weight change:  Body mass index is 19.48 kg/m.   Physical Exam: General exam: Pleasant, elderly Caucasian female Skin: No rashes, lesions or ulcers. HEENT: Atraumatic, normocephalic, Lungs: Clear to auscultation bilaterally,  CVS: S1, S2, no murmur,   GI/Abd: Soft, nontender, nondistended, bowel sound present,   CNS: Alert, awake, oriented x 3 Psychiatry:  Mood appropriate Extremities: Trace bilateral pedal edema, no calf tenderness,   Data Review: I have personally reviewed the laboratory data and studies available.  F/u labs  Unresulted Labs (From admission, onward)     Start     Ordered   02/12/24 1534  Renal function panel  Once,   R  Question:  Specimen collection method  Answer:  Lab=Lab collect   02/12/24 1040   02/12/24 1534  CBC  Once,   R       Question:  Specimen collection method  Answer:  Lab=Lab collect   02/12/24 1040            Total time spent in review of labs and imaging, patient evaluation, formulation of plan, documentation and communication with family: 45 minutes  Signed, Lorin Glass, MD [facility] 02/12/2024

## 2024-02-12 NOTE — Plan of Care (Signed)
  Problem: Coping: Goal: Ability to adjust to condition or change in health will improve Outcome: Progressing   Problem: Skin Integrity: Goal: Risk for impaired skin integrity will decrease Outcome: Progressing   Problem: Activity: Goal: Risk for activity intolerance will decrease Outcome: Progressing   Problem: Nutrition: Goal: Adequate nutrition will be maintained Outcome: Progressing   Problem: Coping: Goal: Level of anxiety will decrease Outcome: Progressing

## 2024-02-12 NOTE — Progress Notes (Signed)
 Received patient in bed to unit.  Alert and oriented.  Informed consent signed and in chart.   TX duration: 2.5 hours  Patient tolerated well.  Transported back to the room  Alert, without acute distress.  Hand-off given to patient's nurse.   Access used: R internal jugular HD cath Access issues: none  Total UF removed: 1.5L Medication(s) given: Ativan, Sodium citrate at 1.6cc each   02/12/24 1700  Vitals  Temp 97.8 F (36.6 C)  Temp Source Oral  BP (!) 141/51  MAP (mmHg) 77  Pulse Rate 67  ECG Heart Rate 69  Resp 16  Oxygen Therapy  SpO2 100 %  O2 Device Room Air  During Treatment Monitoring  Duration of HD Treatment -hour(s) 2.5 hour(s)  HD Safety Checks Performed Yes  Intra-Hemodialysis Comments Tx completed;Tolerated well  Dialysis Fluid Bolus Normal Saline  Bolus Amount (mL) 300 mL  Post Treatment  Dialyzer Clearance Lightly streaked  Liters Processed 60  Fluid Removed (mL) 1500 mL  Tolerated HD Treatment Yes  Hemodialysis Catheter Right Subclavian Double lumen Permanent (Tunneled)  Placement Date/Time: 06/22/23 1316   Serial / Lot #: [id number]  Expiration Date: 12/22/27  Time Out: Correct patient;Correct site;Correct procedure  Maximum sterile barrier precautions: Hand hygiene;Cap;Mask;Sterile gown;Sterile gloves;Large steri...  Site Condition No complications  Blue Lumen Status Flushed;Heparin locked;Dead end cap in place  Red Lumen Status Flushed;Heparin locked;Dead end cap in place  Purple Lumen Status N/A  Catheter fill solution 4% Sodium Citrate  Catheter fill volume (Arterial) 1.6 cc  Catheter fill volume (Venous) 1.6  Dressing Type Transparent  Dressing Status Antimicrobial disc/dressing in place;Clean, Dry, Intact  Interventions Other (Comment) (deaccessed)  Drainage Description None  Dressing Change Due 02/19/24  Post treatment catheter status Capped and Clamped     Stacie Glaze LPN [facility]

## 2024-02-13 DIAGNOSIS — K068 Other specified disorders of gingiva and edentulous alveolar ridge: Secondary | ICD-10-CM | POA: Diagnosis not present

## 2024-02-13 LAB — GLUCOSE, CAPILLARY
Glucose-Capillary: 109 mg/dL — ABNORMAL HIGH (ref 70–99)
Glucose-Capillary: 209 mg/dL — ABNORMAL HIGH (ref 70–99)
Glucose-Capillary: 190 mg/dL — ABNORMAL HIGH (ref 70–99)
Glucose-Capillary: 93 mg/dL (ref 70–99)

## 2024-02-13 MED ORDER — SENNOSIDES-DOCUSATE SODIUM 8.6-50 MG PO TABS: 1.0000 | ORAL_TABLET | Freq: Every day | ORAL | Status: DC

## 2024-02-13 MED ORDER — POLYETHYLENE GLYCOL 3350 17 G PO PACK: 17.0000 g | PACK | Freq: Every day | ORAL | 0 refills | Status: DC | PRN

## 2024-02-13 MED ORDER — SENNOSIDES-DOCUSATE SODIUM 8.6-50 MG PO TABS: 1.0000 | ORAL_TABLET | Freq: Every day | ORAL | 0 refills | Status: AC

## 2024-02-13 MED ORDER — LORAZEPAM 0.5 MG PO TABS: 0.5000 mg | ORAL_TABLET | ORAL | 0 refills | Status: DC

## 2024-02-13 NOTE — Care Management (Cosign Needed)
    Durable Medical Equipment  (From admission, onward)           Start     Ordered   02/13/24 1233  For home use only DME 4 wheeled rolling walker with seat  Once       Question:  Patient needs a walker to treat with the following condition  Answer:  Impaired mobility   02/13/24 1233

## 2024-02-13 NOTE — Plan of Care (Signed)

## 2024-02-13 NOTE — Progress Notes (Signed)
 Alert and oriented x4. Respirations even and unlabored upon entering room. Lung sounds clear. Pt stated she was feeling like her bloodsugar was low, son arrived to take patient home. Nurse attempting to take blood sugar. Son yelling at nurse, "why does she get chicken, when she can't eat it! She is on a renal diet, and how much has she been eating?" Nurse stated that we document consumed amount at each meal. Son then starts yelling, " why are ya'll giving 2 doses of diabetic medication when her bs is so low." Nurse checked BS it is 93. Patient had one bite of a cookie. Nurse left room to get charge. Change nurse came in to talk to patients son. Son continues to yell. Charge nurse clarified that she received insulin coverage for a BS of over 200, not that she received 2 meds." Son continues to complain about diet and who they need to talk to next time to make sure she doesn't even up here again or when she does. Patient apologizing for son behavior. Patients son starts to pack up clothes. Nurse gives glasses, iphone and cord to patient. Double checks  drawers and closet. Patient had a BM. Nurse provided pericare, dresses patient. Assess skin. Skin intact. Dialysis port to R upper chest. Nurse took patient downstairs and assisted patient into care. Educated son on folding/opening w/c and putting on legs. Patient/son given discharge paperwork.

## 2024-02-13 NOTE — Progress Notes (Signed)
 Per MD, pt is requestig outpt PT and a wheelchair. Contacted Frankie with [facility] to discuss DME. She reports that pt is not ready for hospice at this time. Met with pt to discuss DME and rehab. Pt plans to return home with the support of her son and daughter-in-law who live with her. Discussed HH vs outpt therapy. Pt agreed that she is to weak to go to oupt therapy. She agrees with [facility] therapy. Discussed preference for a [facility] agency and CMS Medicare.gov compare list. She chose [facility]. She reports that her daughter-in-law mom used [facility] and she was very happy with them. She needs a wheelchair. Contacted Denyse Amass with [facility] for [facility] PT/OT referral and Ada with [facility] for DME referral. Contacted pt's daughter-in-law Claris Che) and left a VM regarding HH and DME.

## 2024-02-13 NOTE — Care Management (Cosign Needed)
    Durable Medical Equipment  (From admission, onward)           Start     Ordered   02/13/24 1413  For home use only DME lightweight manual wheelchair with seat cushion  Once       Comments: Patient suffers from abnormal bleeding due to uremic, ESRD, volume overload, anemia of ESRD, hemodialysis,  which impairs their ability to perform daily activities like toileting in the home.  A walker will not resolve  issue with performing activities of daily living. A wheelchair will allow patient to safely perform daily activities. Patient is not able to propel themselves in the home using a standard weight wheelchair due to general weakness. Patient can self propel in the lightweight wheelchair. Length of need 12 months . Accessories: elevating leg rests (ELRs), wheel locks, extensions and anti-tippers.   02/13/24 1416   02/13/24 1233  For home use only DME 4 wheeled rolling walker with seat  Once       Question:  Patient needs a walker to treat with the following condition  Answer:  Impaired mobility   02/13/24 1233

## 2024-02-13 NOTE — Progress Notes (Signed)
 [facility] Progress Note   Subjective:    Seen and examined patient at bedside. She is sitting up eating breakfast. Tolerated yesterday's HD with net UF 1.5L. Next HD 2/24.  Objective Vitals:   02/13/24 0527 02/13/24 0551 02/13/24 0822 02/13/24 0907  BP: (!) 114/39 (!) 128/53 (!) 123/39 (!) 116/56  Pulse: (!) 57 60 63 62  Resp: 18  18   Temp: 98.7 F (37.1 C)  98.9 F (37.2 C)   TempSrc: Oral  Oral   SpO2: 96%  99%   Weight:      Height:       Physical Exam General: Chronically ill very elderly female in NAD Neuro: Awake, alert, no asterixis Heart: RRR  Lungs: CTAB Abdomen: NABS Extremities: Trace-1+ BLE edema Dialysis Access: RIJ TDC drsg intact  Filed Weights   02/11/24 1012 02/12/24 1409 02/12/24 1720  Weight: 51.5 kg 49.9 kg 48.4 kg    Intake/Output Summary (Last 24 hours) at 02/13/2024 1048 Last data filed at 02/12/2024 1854 Gross per 24 hour  Intake 240.2 ml  Output 1500 ml  Net -1259.8 ml    Additional Objective Labs: Basic Metabolic Panel: Recent Labs  Lab 02/09/24 1039 02/10/24 0638 02/11/24 0543 02/12/24 1412  NA  --  134* 133* 126*  K  --  4.0 4.6 4.3  CL  --  101 99 95*  CO2  --  22 22 21*  GLUCOSE  --  131* 113* 98  BUN  --  86* 93* 100*  CREATININE  --  5.97* 6.41* 6.43*  CALCIUM  --  9.5 9.1 8.9  PHOS 6.7*  --   --  5.0*   Liver Function Tests: Recent Labs  Lab 02/08/24 2058 02/12/24 1412  AST 21  --   ALT 38  --   ALKPHOS 90  --   BILITOT 0.5  --   PROT 6.1*  --   ALBUMIN 3.6 2.9*   No results for input(s): "LIPASE", "AMYLASE" in the last 168 hours. CBC: Recent Labs  Lab 02/08/24 2058 02/08/24 2100 02/10/24 0638 02/11/24 0543 02/12/24 1412  WBC 4.1  --  6.3 5.1 6.0  NEUTROABS  --   --   --  3.7  --   HGB 9.2*   < > 7.9* 7.7* 7.3*  HCT 30.0*   < > 24.5* 23.9* 22.7*  MCV 99.0  --  93.9 94.5 94.2  PLT 160  --  133* 128* 129*   < > = values in this interval not displayed.   Blood Culture     Component Value Date/Time   SDES BLOOD RIGHT ARM 02/09/2024 1849   SDES BLOOD RIGHT HAND 02/09/2024 1849   SPECREQUEST  02/09/2024 1849    BOTTLES DRAWN AEROBIC AND ANAEROBIC Blood Culture results may not be optimal due to an inadequate volume of blood received in culture bottles   SPECREQUEST  02/09/2024 1849    BOTTLES DRAWN AEROBIC AND ANAEROBIC Blood Culture results may not be optimal due to an inadequate volume of blood received in culture bottles   CULT  02/09/2024 1849    NO GROWTH 4 DAYS Performed at [facility], [address] [place], [place] [address]    CULT  02/09/2024 1849    NO GROWTH 4 DAYS Performed at [facility], [address], [place], [place] [address]    REPTSTATUS PENDING 02/09/2024 1849   REPTSTATUS PENDING 02/09/2024 1849    Cardiac Enzymes: No results for input(s): "  CKTOTAL", "CKMB", "CKMBINDEX", "TROPONINI" in the last 168 hours. CBG: Recent Labs  Lab 02/12/24 0755 02/12/24 1132 02/12/24 1825 02/12/24 2115 02/13/24 0822  GLUCAP 126* 155* 87 208* 109*   Iron Studies: No results for input(s): "IRON", "TIBC", "TRANSFERRIN", "FERRITIN" in the last 72 hours. Lab Results  Component Value Date   INR 1.1 09/07/2023   INR 1.3 (H) 06/12/2023   INR 1.2 06/04/2023   Studies/Results: No results found.  Medications:   amiodarone  200 mg Oral Daily   atorvastatin  80 mg Oral Daily   Chlorhexidine Gluconate Cloth  6 each Topical Q0600   cinacalcet  30 mg Oral Q M,W,F-1800   darbepoetin (ARANESP) injection - DIALYSIS  100 mcg Subcutaneous Q Fri-1800   ferric citrate  210 mg Oral TID WC   furosemide  80 mg Oral BID   insulin aspart  0-5 Units Subcutaneous QHS   insulin aspart  0-9 Units Subcutaneous TID WC   levothyroxine  75 mcg Oral Q0600   LORazepam  0.5 mg Oral Q M,W,F-HD   metoprolol succinate  12.5 mg Oral Daily   rOPINIRole  0.25 mg Oral QPM    Dialysis Orders: NEW HD ORDERS 02/11/2024 1st and 2nd HD 2.5 hrs 250/500  3rd  HD 3 hours 300/600  4th HD 3.5 hours 400/600   - Heparin 2000 units IV  - last HD 1/31, post wt 53kg - sensipar 30mg  three times per week-c - venofer 50 weekly - no esa, last Hb 10.9 1/31, last mircera 12/08/23 - last pth 2310  Assessment/Plan: Abnormal bleeding - of the gums mostly, possibly due to uremic from missed HD. Will give a dose of DDAVP.  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 No HD for 18 days but she decided to give HD another try so treatment has been resumed. Next HD 2/24. Please give lorazepam 0.5 mg PO prior to HD.  HTN - bp's are good 150/60, cont home meds.  Volume - +vol overloaded w/ LE edema, no resp issues. On RA.  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  Tsat OK. Start ESA with HD tomorrow.  Secondary hyperparathyroidism - CCa in range, add on phos. 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 Has been seen by Palliative Care but now asking to try HD again.   Salome Holmes, NP [facility] 02/13/2024,10:48 AM  LOS: 4 days

## 2024-02-13 NOTE — Progress Notes (Signed)
 Patient waiting on wheelchair to be delivered.

## 2024-02-13 NOTE — Discharge Summary (Signed)
 Physician Discharge Summary  Cassandra Baker:[id number] DOB: [birth year] DOA: 02/08/2024  PCP: Cassandra Dills, MD  Admit date: 02/08/2024 Discharge date: 02/13/2024  Admitted From: Home Discharge disposition: Home with outpatient PT  Recommendations at discharge:  Follow-up with outpatient dialysis For constipation - take Senokot scheduled and MiraLAX as needed Stop [place]   Brief narrative: Cassandra Baker is a 83 y.o. 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  2/17, patient presented to the ED with complaint of bleeding at lower gums for 1 week. Patient reports that around that time when she stopped all her medication including aspirin and Plavix. She also missed hemodialysis for about a week.  She also noted intermittent shortness of breath, bilateral leg swelling.  No urine output in 1 week.  In the ED, patient was afebrile, hemodynamically stable, breathing on room air On exam by EDP, patient did not have any active gum bleeding, had some healed inner lip laceration. Initial labs with WBC count 4.1, hemoglobin 9.2, sodium 138, potassium 5.2, glucose 176, BUN/creatinine significant elevated to 157/7.9. Patient was given 1 dose of Lasix IV 80 mg. Admitted to [facility] Nephrology was consulted for dialysis. Palliative care was consulted as well   Subjective: Patient was seen and examined this morning.  Sitting up at the edge of the bed. Not in distress.  Feels ready to go home. I had a long conversation with her and her daughter-in-law Ms. Margaret on the phone.  Hospital course: Abnormal bleeding of the gums Likely due to uremia from missed hemodialysis No active bleeding currently.  Given a dose of DDAVP by nephrology  ESRD Missed dialysis Volume overload status Supposed to be on dialysis MWF.  But no dialysis for 7 days Blood pressure was good but had bilateral  lower extremity edema Underwent dialysis on 2/19 and 2/21.  Symptoms improved. She states she would feel better able to tolerate dialysis if she will get some sedative prior to dialysis She was given Ativan 0.5 mg prior to dialysis with which she felt much better.  She wants to continue it as an outpatient.  Limited supply given. Patient says she makes urine and she thinks she will do okay with once a week dialysis.  We have advised her and her family to follow-up with nephrology as an outpatient and plan further. PTA meds- Toprol 12.5 mg daily, Lasix 80 mg twice daily Continue both.  H/o polymorphic V. Tach Continue amiodarone  Type 2 diabetes mellitus A1c 6.3 on 02/08/2024 PTA meds-Januvia 25 mg daily Based on her controlled A1c and low appetite, I would recommend to stop Januvia.  HLD Continue aspirin, Plavix and statin  Chronic anemia Hemoglobin at baseline between 8 and 9.  Down to 7.9 today.  No active bleeding.  Continue to monitor as an outpatient Recent Labs    06/05/23 0521 06/06/23 0436 06/15/23 0413 06/15/23 1001 02/08/24 2058 02/08/24 2100 02/09/24 1039 02/10/24 0638 02/11/24 0543 02/12/24 1412  HGB 9.2*   < > 8.0*   < > 9.2* 8.8*  --  7.9* 7.7* 7.3*  MCV 97.4   < > 95.9   < > 99.0  --   --  93.9 94.5 94.2  VITAMINB12 344  --   --   --   --   --   --   --   --   --   FOLATE 16.6  --   --   --   --   --   --   --   --   --  FERRITIN  --   --  152  --   --   --  813*  --   --   --   TIBC  --   --  287  --   --   --  251  --   --   --   IRON  --   --  69  --   --   --  142  --   --   --    < > = values in this interval not displayed.    Depression Seems no longer on Prozac  Hypothyroidism Synthroid  Constipation Scheduled Senokot and as needed MiraLAX  Mobility: Encourage ambulation.  Home with outpatient PT  Goals of care:   Code Status: Full Code  Palliative care consulted.  Diet:  Diet Order             Diet general           Diet renal  with fluid restriction Fluid restriction: 1200 mL Fluid; Room service appropriate? Yes; Fluid consistency: Thin  Diet effective now                   Nutritional status:  Body mass index is 18.31 kg/m.       Wounds:  - Wound / Incision (Open or Dehisced) 09/08/23 Laceration Head Posterior injury from fall-staples placed in the ED (Active)  Date First Assessed/Time First Assessed: 09/08/23 0242   Wound Type: Laceration  Location: Head  Location Orientation: Posterior  Wound Description (Comments): injury from fall-staples placed in the ED  Present on Admission: Yes    Assessments 09/08/2023  2:42 AM 09/08/2023  8:20 PM  Dressing Type None None  Site / Wound Assessment Clean;Dry Clean;Dry  Closure Staples Staples  Drainage Amount None None     No associated orders.    Discharge Exam:   Vitals:   02/13/24 0527 02/13/24 0551 02/13/24 0822 02/13/24 0907  BP: (!) 114/39 (!) 128/53 (!) 123/39 (!) 116/56  Pulse: (!) 57 60 63 62  Resp: 18  18   Temp: 98.7 F (37.1 C)  98.9 F (37.2 C)   TempSrc: Oral  Oral   SpO2: 96%  99%   Weight:      Height:        Body mass index is 18.31 kg/m.  General exam: Pleasant, elderly Caucasian female Skin: No rashes, lesions or ulcers. HEENT: Atraumatic, normocephalic, Lungs: Clear to auscultation bilaterally,  CVS: S1, S2, no murmur,   GI/Abd: Soft, nontender, nondistended, bowel sound present. CNS: Alert, awake, oriented x 3 Psychiatry: Mood appropriate. Extremities: Trace bilateral pedal edema, no calf tenderness,   Follow ups:    Follow-up Information     Cassandra Dills, MD Follow up.   Specialty: Internal Medicine Contact information: [address] [place] [address] [phone]         [facility], [facility] Follow up.   Specialty: Home Health Services Why: [facility] will provide home health physical and occupational therapy. Someone from [place] will be contacting you to arrange your  first appointment. Contact information: [address] [place] [address] [phone]                 Discharge Instructions:   Discharge Instructions     Call MD for:  difficulty breathing, headache or visual disturbances   Complete by: As directed    Call MD for:  extreme fatigue  Complete by: As directed    Call MD for:  hives   Complete by: As directed    Call MD for:  persistant dizziness or light-headedness   Complete by: As directed    Call MD for:  persistant nausea and vomiting   Complete by: As directed    Call MD for:  severe uncontrolled pain   Complete by: As directed    Call MD for:  temperature >100.4   Complete by: As directed    Diet general   Complete by: As directed    Renal diet   Discharge instructions   Complete by: As directed    Recommendations at discharge:   Follow-up with outpatient dialysis  For constipation - take Senokot scheduled and MiraLAX as needed  Stop januvia  General discharge instructions: Follow with Primary MD Cassandra Dills, MD in 7 days  Please request your PCP  to go over your hospital tests, procedures, radiology results at the follow up. Please get your medicines reviewed and adjusted.  Your PCP may decide to repeat certain labs or tests as needed. Do not drive, operate heavy machinery, perform activities at heights, swimming or participation in water activities or provide baby sitting services if your were admitted for syncope or siezures until you have seen by Primary MD or a Neurologist and advised to do so again. [place] Controlled Substance Reporting System database was reviewed. Do not drive, operate heavy machinery, perform activities at heights, swim, participate in water activities or provide baby-sitting services while on medications for pain, sleep and mood until your outpatient physician has reevaluated you and advised to do so again.  You are strongly recommended to comply with the  dose, frequency and duration of prescribed medications. Activity: As tolerated with Full fall precautions use walker/cane & assistance as needed Avoid using any recreational substances like cigarette, tobacco, alcohol, or non-prescribed drug. If you experience worsening of your admission symptoms, develop shortness of breath, life threatening emergency, suicidal or homicidal thoughts you must seek medical attention immediately by calling 911 or calling your MD immediately  if symptoms less severe. You must read complete instructions/literature along with all the possible adverse reactions/side effects for all the medicines you take and that have been prescribed to you. Take any new medicine only after you have completely understood and accepted all the possible adverse reactions/side effects.  Wear Seat belts while driving. You were cared for by a hospitalist during your hospital stay. If you have any questions about your discharge medications or the care you received while you were in the hospital after you are discharged, you can call the unit and ask to speak with the hospitalist or the covering physician. Once you are discharged, your primary care physician will handle any further medical issues. Please note that NO REFILLS for any discharge medications will be authorized once you are discharged, as it is imperative that you return to your primary care physician (or establish a relationship with a primary care physician if you do not have one).   Increase activity slowly   Complete by: As directed        Discharge Medications:   Allergies as of 02/13/2024       Reactions   Plum Pulp Anaphylaxis, Cough   Almond (diagnostic) Itching   Lemon Oil Other (See Comments)   Lemons cause coughing and respiratory issues    Other Itching   Dairy products/Soaps & detergents   Peanut-containing Drug Products Itching   Pork-derived Products  Religious beliefs   Penicillins Rash   Tolerates  ceftriaxone        Medication List     STOP taking these medications    FLUoxetine 20 MG tablet Commonly known as: PROZAC   sitaGLIPtin 25 MG tablet Commonly known as: JANUVIA   sodium bicarbonate 650 MG tablet       TAKE these medications    acetaminophen 500 MG tablet Commonly known as: TYLENOL Take 500-1,000 mg by mouth every 6 (six) hours as needed (pain).   allopurinol 100 MG tablet Commonly known as: ZYLOPRIM Take 100 mg by mouth daily as needed (For gout).   amiodarone 200 MG tablet Commonly known as: PACERONE Take 1 tablet (200 mg total) by mouth daily.   aspirin EC 81 MG tablet Take 1 tablet (81 mg total) by mouth daily. Swallow whole. What changed: additional instructions   atorvastatin 80 MG tablet Commonly known as: LIPITOR Take 1 tablet (80 mg total) by mouth daily.   Auryxia 1 GM 210 MG(Fe) tablet Generic drug: ferric citrate Take 210 mg by mouth 3 (three) times daily with meals.   clopidogrel 75 MG tablet Commonly known as: PLAVIX Take 1 tablet (75 mg total) by mouth daily.   furosemide 80 MG tablet Commonly known as: LASIX Take 80 mg by mouth 2 (two) times daily.   levothyroxine 75 MCG tablet Commonly known as: SYNTHROID Take 75 mcg by mouth daily before breakfast.   lidocaine 5 % Commonly known as: Lidoderm Place 1 patch onto the skin every 12 (twelve) hours. Remove & Discard patch within 12 hours or as directed by MD   LORazepam 0.5 MG tablet Commonly known as: ATIVAN Take 1 tablet (0.5 mg total) by mouth every Monday, Wednesday, and Friday with hemodialysis. Start taking on: February 15, 2024   metoprolol succinate 25 MG 24 hr tablet Commonly known as: Toprol XL Take 0.5 tablets (12.5 mg total) by mouth daily.   nitroGLYCERIN 0.4 MG SL tablet Commonly known as: NITROSTAT Place 1 tablet (0.4 mg total) under the tongue every 5 (five) minutes x 3 doses as needed for chest pain.   polyethylene glycol 17 g packet Commonly  known as: MIRALAX / GLYCOLAX Take 17 g by mouth daily as needed for mild constipation.   rOPINIRole 0.25 MG tablet Commonly known as: REQUIP Take 0.25 mg by mouth every evening.   senna-docusate 8.6-50 MG tablet Commonly known as: Senokot-S Take 1 tablet by mouth at bedtime.               Durable Medical Equipment  (From admission, onward)           Start     Ordered   02/13/24 1413  For home use only DME lightweight manual wheelchair with seat cushion  Once       Comments: Patient suffers from abnormal bleeding due to uremic, ESRD, volume overload, anemia of ESRD, hemodialysis,  which impairs their ability to perform daily activities like toileting in the home.  A walker will not resolve  issue with performing activities of daily living. A wheelchair will allow patient to safely perform daily activities. Patient is not able to propel themselves in the home using a standard weight wheelchair due to general weakness. Patient can self propel in the lightweight wheelchair. Length of need 12 months . Accessories: elevating leg rests (ELRs), wheel locks, extensions and anti-tippers.   02/13/24 1416   02/13/24 1233  For home use only DME 4 wheeled rolling walker with  seat  Once       Question:  Patient needs a walker to treat with the following condition  Answer:  Impaired mobility   02/13/24 1233             The results of significant diagnostics from this hospitalization (including imaging, microbiology, ancillary and laboratory) are listed below for reference.    Procedures and Diagnostic Studies:   No results found.   Labs:   Basic Metabolic Panel: Recent Labs  Lab 02/08/24 2058 02/08/24 2100 02/09/24 1039 02/10/24 0638 02/11/24 0543 02/12/24 1412  NA 138 137  --  134* 133* 126*  K 5.2* 5.0  --  4.0 4.6 4.3  CL 105 111  --  101 99 95*  CO2 17*  --   --  22 22 21*  GLUCOSE 176* 170*  --  131* 113* 98  BUN 157* >130*  --  86* 93* 100*  CREATININE 7.90*  8.80*  --  5.97* 6.41* 6.43*  CALCIUM 10.1  --   --  9.5 9.1 8.9  PHOS  --   --  6.7*  --   --  5.0*   GFR Estimated Creatinine Clearance: 5.1 mL/min (A) (by C-G formula based on SCr of 6.43 mg/dL (H)). Liver Function Tests: Recent Labs  Lab 02/08/24 2058 02/12/24 1412  AST 21  --   ALT 38  --   ALKPHOS 90  --   BILITOT 0.5  --   PROT 6.1*  --   ALBUMIN 3.6 2.9*   No results for input(s): "LIPASE", "AMYLASE" in the last 168 hours. No results for input(s): "AMMONIA" in the last 168 hours. Coagulation profile No results for input(s): "INR", "PROTIME" in the last 168 hours.  CBC: Recent Labs  Lab 02/08/24 2058 02/08/24 2100 02/10/24 0638 02/11/24 0543 02/12/24 1412  WBC 4.1  --  6.3 5.1 6.0  NEUTROABS  --   --   --  3.7  --   HGB 9.2* 8.8* 7.9* 7.7* 7.3*  HCT 30.0* 26.0* 24.5* 23.9* 22.7*  MCV 99.0  --  93.9 94.5 94.2  PLT 160  --  133* 128* 129*   Cardiac Enzymes: No results for input(s): "CKTOTAL", "CKMB", "CKMBINDEX", "TROPONINI" in the last 168 hours. BNP: Invalid input(s): "POCBNP" CBG: Recent Labs  Lab 02/12/24 1132 02/12/24 1825 02/12/24 2115 02/13/24 0822 02/13/24 1149  GLUCAP 155* 87 208* 109* 209*   D-Dimer No results for input(s): "DDIMER" in the last 72 hours. Hgb A1c No results for input(s): "HGBA1C" in the last 72 hours. Lipid Profile No results for input(s): "CHOL", "HDL", "LDLCALC", "TRIG", "CHOLHDL", "LDLDIRECT" in the last 72 hours. Thyroid function studies No results for input(s): "TSH", "T4TOTAL", "T3FREE", "THYROIDAB" in the last 72 hours.  Invalid input(s): "FREET3" Anemia work up No results for input(s): "VITAMINB12", "FOLATE", "FERRITIN", "TIBC", "IRON", "RETICCTPCT" in the last 72 hours. Microbiology Recent Results (from the past 240 hours)  Culture, blood (Routine X 2) w Reflex to ID Panel     Status: None (Preliminary result)   Collection Time: 02/09/24  6:49 PM   Specimen: BLOOD RIGHT ARM  Result Value Ref Range Status    Specimen Description BLOOD RIGHT ARM  Final   Special Requests   Final    BOTTLES DRAWN AEROBIC AND ANAEROBIC Blood Culture results may not be optimal due to an inadequate volume of blood received in culture bottles   Culture   Final    NO GROWTH 4 DAYS Performed at [facility]  Hospital Lab, [address], [place], [place] [address]    Report Status PENDING  Incomplete  Culture, blood (Routine X 2) w Reflex to ID Panel     Status: None (Preliminary result)   Collection Time: 02/09/24  6:49 PM   Specimen: BLOOD RIGHT HAND  Result Value Ref Range Status   Specimen Description BLOOD RIGHT HAND  Final   Special Requests   Final    BOTTLES DRAWN AEROBIC AND ANAEROBIC Blood Culture results may not be optimal due to an inadequate volume of blood received in culture bottles   Culture   Final    NO GROWTH 4 DAYS Performed at [facility], [address] [place], [place] [address]    Report Status PENDING  Incomplete    Time coordinating discharge: 45 minutes  Signed: Kieron Kantner  [facility] 02/13/2024, 2:17 PM

## 2024-02-13 NOTE — Progress Notes (Signed)
 Pts family made aware her equipment was delivered and that she has had a discharge order in since this morning. Pts son is on the way to get her.

## 2024-02-14 LAB — CULTURE, BLOOD (ROUTINE X 2)
Culture: NO GROWTH
Culture: NO GROWTH

## 2024-02-14 NOTE — Discharge Planning (Signed)
 [facility] Patient Discharge Orders- [facility] CLINIC: [facility]  Patient's name: Cassandra Baker Admit/DC Dates: 02/08/2024 - 02/13/2024  Discharge Diagnoses: Abnormal bleeding from gums - likely from missed HD. DDAVP given here.  Volume overload 2nd missed HD Hx polymorphic V-tach - on PO Amiodarone  Aranesp Given: Yes Date and amount of last dose: given 02/12/24    Last Hgb: 7.3 PRBC's Given: No ESA dose for discharge: mircera 150 mcg IV q 2 weeks  IV Iron dose at discharge: resume weekly Fe  Heparin change: Hold heparin for now given issue with gum bleeding while here. DDAVP given while here. Will consider restarting if she has issues with clotting. Monitoe for signs of bleeding.  EDW Change: Yes New EDW: Lower to 50kg. Under EDW while here.   Bath Change: No  Access intervention/Change: No  Hectorol/Calcitriol change: N/A  Discharge Labs: Calcium 8.9 Phosphorus 5.0 Albumin 2.9 K+ 4.3  IV Antibiotics: No  On Coumadin?: No. On Baby ASA   OTHER/APPTS/LAB ORDERS: Please note patient originally stopped HD while here BUT she changed her mind and decided to give it another try. HD was then resumed.     D/C Meds to be reconciled by nurse after every discharge.  Completed By: Salome Holmes, NP   Reviewed by: MD:______ RN_______

## 2024-02-15 NOTE — Progress Notes (Signed)
 Late Note Entry- Feb 15, 2024  Pt was d/c on Saturday. Contacted [facility] to be advised of pt's d/c date and that pt should resume care today.   Olivia Canter Renal Navigator [phone]

## 2024-02-16 ENCOUNTER — Telehealth: Payer: Self-pay | Admitting: Nurse Practitioner

## 2024-02-16 DIAGNOSIS — E440 Moderate protein-calorie malnutrition: Secondary | ICD-10-CM | POA: Diagnosis not present

## 2024-02-16 DIAGNOSIS — F419 Anxiety disorder, unspecified: Secondary | ICD-10-CM | POA: Diagnosis not present

## 2024-02-16 DIAGNOSIS — R262 Difficulty in walking, not elsewhere classified: Secondary | ICD-10-CM | POA: Diagnosis not present

## 2024-02-16 DIAGNOSIS — D631 Anemia in chronic kidney disease: Secondary | ICD-10-CM | POA: Diagnosis not present

## 2024-02-16 DIAGNOSIS — Z1629 Resistance to other single specified antibiotic: Secondary | ICD-10-CM | POA: Diagnosis not present

## 2024-02-16 DIAGNOSIS — R9431 Abnormal electrocardiogram [ECG] [EKG]: Secondary | ICD-10-CM | POA: Diagnosis not present

## 2024-02-16 DIAGNOSIS — E871 Hypo-osmolality and hyponatremia: Secondary | ICD-10-CM | POA: Diagnosis not present

## 2024-02-16 DIAGNOSIS — Z955 Presence of coronary angioplasty implant and graft: Secondary | ICD-10-CM | POA: Diagnosis not present

## 2024-02-16 DIAGNOSIS — Z7401 Bed confinement status: Secondary | ICD-10-CM | POA: Diagnosis not present

## 2024-02-16 DIAGNOSIS — M1990 Unspecified osteoarthritis, unspecified site: Secondary | ICD-10-CM | POA: Diagnosis not present

## 2024-02-16 DIAGNOSIS — I5032 Chronic diastolic (congestive) heart failure: Secondary | ICD-10-CM | POA: Diagnosis not present

## 2024-02-16 DIAGNOSIS — I6521 Occlusion and stenosis of right carotid artery: Secondary | ICD-10-CM | POA: Diagnosis not present

## 2024-02-16 DIAGNOSIS — K219 Gastro-esophageal reflux disease without esophagitis: Secondary | ICD-10-CM | POA: Diagnosis not present

## 2024-02-16 DIAGNOSIS — B9620 Unspecified Escherichia coli [E. coli] as the cause of diseases classified elsewhere: Secondary | ICD-10-CM | POA: Diagnosis not present

## 2024-02-16 DIAGNOSIS — R339 Retention of urine, unspecified: Secondary | ICD-10-CM | POA: Diagnosis not present

## 2024-02-16 DIAGNOSIS — E8721 Acute metabolic acidosis: Secondary | ICD-10-CM | POA: Diagnosis not present

## 2024-02-16 DIAGNOSIS — Z8719 Personal history of other diseases of the digestive system: Secondary | ICD-10-CM | POA: Diagnosis not present

## 2024-02-16 DIAGNOSIS — R2689 Other abnormalities of gait and mobility: Secondary | ICD-10-CM | POA: Diagnosis not present

## 2024-02-16 DIAGNOSIS — G2581 Restless legs syndrome: Secondary | ICD-10-CM | POA: Diagnosis not present

## 2024-02-16 DIAGNOSIS — D649 Anemia, unspecified: Secondary | ICD-10-CM | POA: Diagnosis not present

## 2024-02-16 DIAGNOSIS — I959 Hypotension, unspecified: Secondary | ICD-10-CM | POA: Diagnosis not present

## 2024-02-16 DIAGNOSIS — I252 Old myocardial infarction: Secondary | ICD-10-CM | POA: Diagnosis not present

## 2024-02-16 DIAGNOSIS — F32A Depression, unspecified: Secondary | ICD-10-CM | POA: Diagnosis not present

## 2024-02-16 DIAGNOSIS — Z681 Body mass index (BMI) 19 or less, adult: Secondary | ICD-10-CM | POA: Diagnosis not present

## 2024-02-16 DIAGNOSIS — K5733 Diverticulitis of large intestine without perforation or abscess with bleeding: Secondary | ICD-10-CM | POA: Diagnosis not present

## 2024-02-16 DIAGNOSIS — E1122 Type 2 diabetes mellitus with diabetic chronic kidney disease: Secondary | ICD-10-CM | POA: Diagnosis not present

## 2024-02-16 DIAGNOSIS — E039 Hypothyroidism, unspecified: Secondary | ICD-10-CM | POA: Diagnosis present

## 2024-02-16 DIAGNOSIS — Q2112 Patent foramen ovale: Secondary | ICD-10-CM | POA: Diagnosis not present

## 2024-02-16 DIAGNOSIS — R531 Weakness: Secondary | ICD-10-CM | POA: Diagnosis not present

## 2024-02-16 DIAGNOSIS — N3000 Acute cystitis without hematuria: Secondary | ICD-10-CM | POA: Diagnosis not present

## 2024-02-16 DIAGNOSIS — M150 Primary generalized (osteo)arthritis: Secondary | ICD-10-CM | POA: Diagnosis not present

## 2024-02-16 DIAGNOSIS — I132 Hypertensive heart and chronic kidney disease with heart failure and with stage 5 chronic kidney disease, or end stage renal disease: Secondary | ICD-10-CM | POA: Diagnosis not present

## 2024-02-16 DIAGNOSIS — I4729 Other ventricular tachycardia: Secondary | ICD-10-CM | POA: Diagnosis not present

## 2024-02-16 DIAGNOSIS — K921 Melena: Secondary | ICD-10-CM | POA: Diagnosis not present

## 2024-02-16 DIAGNOSIS — Z1623 Resistance to quinolones and fluoroquinolones: Secondary | ICD-10-CM | POA: Diagnosis not present

## 2024-02-16 DIAGNOSIS — I10 Essential (primary) hypertension: Secondary | ICD-10-CM | POA: Diagnosis not present

## 2024-02-16 DIAGNOSIS — E876 Hypokalemia: Secondary | ICD-10-CM | POA: Diagnosis not present

## 2024-02-16 DIAGNOSIS — R58 Hemorrhage, not elsewhere classified: Secondary | ICD-10-CM | POA: Diagnosis not present

## 2024-02-16 DIAGNOSIS — N186 End stage renal disease: Secondary | ICD-10-CM | POA: Diagnosis not present

## 2024-02-16 DIAGNOSIS — K5792 Diverticulitis of intestine, part unspecified, without perforation or abscess without bleeding: Secondary | ICD-10-CM | POA: Diagnosis not present

## 2024-02-16 DIAGNOSIS — N390 Urinary tract infection, site not specified: Secondary | ICD-10-CM | POA: Diagnosis not present

## 2024-02-16 DIAGNOSIS — D692 Other nonthrombocytopenic purpura: Secondary | ICD-10-CM | POA: Diagnosis not present

## 2024-02-16 DIAGNOSIS — M6259 Muscle wasting and atrophy, not elsewhere classified, multiple sites: Secondary | ICD-10-CM | POA: Diagnosis not present

## 2024-02-16 DIAGNOSIS — Z992 Dependence on renal dialysis: Secondary | ICD-10-CM | POA: Diagnosis not present

## 2024-02-16 DIAGNOSIS — I2510 Atherosclerotic heart disease of native coronary artery without angina pectoris: Secondary | ICD-10-CM | POA: Diagnosis not present

## 2024-02-16 DIAGNOSIS — R41841 Cognitive communication deficit: Secondary | ICD-10-CM | POA: Diagnosis not present

## 2024-02-16 DIAGNOSIS — G459 Transient cerebral ischemic attack, unspecified: Secondary | ICD-10-CM | POA: Diagnosis not present

## 2024-02-16 NOTE — Telephone Encounter (Signed)
 Transition of care contact from inpatient facility  Date of Discharge: 02/13/2024 Date of Contact: 02/16/2024 Method of contact: Phone  Attempted to contact patient to discuss transition of care from inpatient admission. Patient did not answer the phone. Message was left on the patient's voicemail with call back number [phone].

## 2024-02-18 DIAGNOSIS — G459 Transient cerebral ischemic attack, unspecified: Secondary | ICD-10-CM

## 2024-02-20 DIAGNOSIS — E1122 Type 2 diabetes mellitus with diabetic chronic kidney disease: Secondary | ICD-10-CM | POA: Diagnosis not present

## 2024-02-20 DIAGNOSIS — N186 End stage renal disease: Secondary | ICD-10-CM | POA: Diagnosis not present

## 2024-02-20 DIAGNOSIS — Z992 Dependence on renal dialysis: Secondary | ICD-10-CM | POA: Diagnosis not present

## 2024-02-22 DIAGNOSIS — Z91158 Patient's noncompliance with renal dialysis for other reason: Secondary | ICD-10-CM | POA: Diagnosis not present

## 2024-02-22 DIAGNOSIS — G2581 Restless legs syndrome: Secondary | ICD-10-CM | POA: Diagnosis not present

## 2024-02-22 DIAGNOSIS — N390 Urinary tract infection, site not specified: Secondary | ICD-10-CM | POA: Diagnosis not present

## 2024-02-22 DIAGNOSIS — I5032 Chronic diastolic (congestive) heart failure: Secondary | ICD-10-CM | POA: Diagnosis present

## 2024-02-22 DIAGNOSIS — R262 Difficulty in walking, not elsewhere classified: Secondary | ICD-10-CM | POA: Diagnosis not present

## 2024-02-22 DIAGNOSIS — R531 Weakness: Secondary | ICD-10-CM | POA: Diagnosis not present

## 2024-02-22 DIAGNOSIS — I5030 Unspecified diastolic (congestive) heart failure: Secondary | ICD-10-CM | POA: Diagnosis not present

## 2024-02-22 DIAGNOSIS — R079 Chest pain, unspecified: Secondary | ICD-10-CM | POA: Diagnosis not present

## 2024-02-22 DIAGNOSIS — R0989 Other specified symptoms and signs involving the circulatory and respiratory systems: Secondary | ICD-10-CM | POA: Diagnosis not present

## 2024-02-22 DIAGNOSIS — E440 Moderate protein-calorie malnutrition: Secondary | ICD-10-CM | POA: Diagnosis not present

## 2024-02-22 DIAGNOSIS — M79642 Pain in left hand: Secondary | ICD-10-CM | POA: Diagnosis not present

## 2024-02-22 DIAGNOSIS — Z789 Other specified health status: Secondary | ICD-10-CM | POA: Diagnosis not present

## 2024-02-22 DIAGNOSIS — Z7401 Bed confinement status: Secondary | ICD-10-CM | POA: Diagnosis not present

## 2024-02-22 DIAGNOSIS — K5792 Diverticulitis of intestine, part unspecified, without perforation or abscess without bleeding: Secondary | ICD-10-CM | POA: Diagnosis not present

## 2024-02-22 DIAGNOSIS — N2581 Secondary hyperparathyroidism of renal origin: Secondary | ICD-10-CM | POA: Diagnosis not present

## 2024-02-22 DIAGNOSIS — I6521 Occlusion and stenosis of right carotid artery: Secondary | ICD-10-CM | POA: Diagnosis not present

## 2024-02-22 DIAGNOSIS — K219 Gastro-esophageal reflux disease without esophagitis: Secondary | ICD-10-CM | POA: Diagnosis not present

## 2024-02-22 DIAGNOSIS — M545 Low back pain, unspecified: Secondary | ICD-10-CM | POA: Diagnosis not present

## 2024-02-22 DIAGNOSIS — R339 Retention of urine, unspecified: Secondary | ICD-10-CM | POA: Diagnosis not present

## 2024-02-22 DIAGNOSIS — E1122 Type 2 diabetes mellitus with diabetic chronic kidney disease: Secondary | ICD-10-CM | POA: Diagnosis not present

## 2024-02-22 DIAGNOSIS — R58 Hemorrhage, not elsewhere classified: Secondary | ICD-10-CM | POA: Diagnosis not present

## 2024-02-22 DIAGNOSIS — I10 Essential (primary) hypertension: Secondary | ICD-10-CM | POA: Diagnosis present

## 2024-02-22 DIAGNOSIS — M7989 Other specified soft tissue disorders: Secondary | ICD-10-CM | POA: Diagnosis not present

## 2024-02-22 DIAGNOSIS — D692 Other nonthrombocytopenic purpura: Secondary | ICD-10-CM | POA: Diagnosis not present

## 2024-02-22 DIAGNOSIS — R338 Other retention of urine: Secondary | ICD-10-CM | POA: Diagnosis not present

## 2024-02-22 DIAGNOSIS — I6523 Occlusion and stenosis of bilateral carotid arteries: Secondary | ICD-10-CM | POA: Diagnosis present

## 2024-02-22 DIAGNOSIS — M6259 Muscle wasting and atrophy, not elsewhere classified, multiple sites: Secondary | ICD-10-CM | POA: Diagnosis not present

## 2024-02-22 DIAGNOSIS — N3000 Acute cystitis without hematuria: Secondary | ICD-10-CM | POA: Diagnosis not present

## 2024-02-22 DIAGNOSIS — N19 Unspecified kidney failure: Secondary | ICD-10-CM | POA: Diagnosis not present

## 2024-02-22 DIAGNOSIS — D649 Anemia, unspecified: Secondary | ICD-10-CM | POA: Diagnosis not present

## 2024-02-22 DIAGNOSIS — A0811 Acute gastroenteropathy due to Norwalk agent: Secondary | ICD-10-CM | POA: Diagnosis not present

## 2024-02-22 DIAGNOSIS — D631 Anemia in chronic kidney disease: Secondary | ICD-10-CM | POA: Diagnosis not present

## 2024-02-22 DIAGNOSIS — D509 Iron deficiency anemia, unspecified: Secondary | ICD-10-CM | POA: Diagnosis not present

## 2024-02-22 DIAGNOSIS — Z978 Presence of other specified devices: Secondary | ICD-10-CM | POA: Diagnosis not present

## 2024-02-22 DIAGNOSIS — R2689 Other abnormalities of gait and mobility: Secondary | ICD-10-CM | POA: Diagnosis not present

## 2024-02-22 DIAGNOSIS — Z992 Dependence on renal dialysis: Secondary | ICD-10-CM | POA: Diagnosis present

## 2024-02-22 DIAGNOSIS — Z955 Presence of coronary angioplasty implant and graft: Secondary | ICD-10-CM | POA: Diagnosis not present

## 2024-02-22 DIAGNOSIS — F419 Anxiety disorder, unspecified: Secondary | ICD-10-CM | POA: Diagnosis not present

## 2024-02-22 DIAGNOSIS — Q2112 Patent foramen ovale: Secondary | ICD-10-CM | POA: Diagnosis present

## 2024-02-22 DIAGNOSIS — I959 Hypotension, unspecified: Secondary | ICD-10-CM | POA: Diagnosis not present

## 2024-02-22 DIAGNOSIS — I2510 Atherosclerotic heart disease of native coronary artery without angina pectoris: Secondary | ICD-10-CM | POA: Diagnosis present

## 2024-02-22 DIAGNOSIS — N186 End stage renal disease: Secondary | ICD-10-CM | POA: Diagnosis present

## 2024-02-22 DIAGNOSIS — R41841 Cognitive communication deficit: Secondary | ICD-10-CM | POA: Diagnosis not present

## 2024-02-22 DIAGNOSIS — Z8719 Personal history of other diseases of the digestive system: Secondary | ICD-10-CM | POA: Diagnosis not present

## 2024-02-22 DIAGNOSIS — I4729 Other ventricular tachycardia: Secondary | ICD-10-CM | POA: Diagnosis present

## 2024-02-22 DIAGNOSIS — E039 Hypothyroidism, unspecified: Secondary | ICD-10-CM | POA: Diagnosis not present

## 2024-02-22 DIAGNOSIS — M150 Primary generalized (osteo)arthritis: Secondary | ICD-10-CM | POA: Diagnosis not present

## 2024-02-23 ENCOUNTER — Ambulatory Visit: Payer: Medicare Other | Attending: Cardiovascular Disease | Admitting: Cardiovascular Disease

## 2024-02-23 DIAGNOSIS — E1122 Type 2 diabetes mellitus with diabetic chronic kidney disease: Secondary | ICD-10-CM | POA: Diagnosis not present

## 2024-02-23 DIAGNOSIS — Z992 Dependence on renal dialysis: Secondary | ICD-10-CM | POA: Diagnosis not present

## 2024-02-23 DIAGNOSIS — N186 End stage renal disease: Secondary | ICD-10-CM | POA: Diagnosis not present

## 2024-02-23 DIAGNOSIS — R262 Difficulty in walking, not elsewhere classified: Secondary | ICD-10-CM | POA: Diagnosis not present

## 2024-02-24 DIAGNOSIS — N2581 Secondary hyperparathyroidism of renal origin: Secondary | ICD-10-CM | POA: Diagnosis not present

## 2024-02-24 DIAGNOSIS — R339 Retention of urine, unspecified: Secondary | ICD-10-CM | POA: Diagnosis not present

## 2024-02-24 DIAGNOSIS — I5030 Unspecified diastolic (congestive) heart failure: Secondary | ICD-10-CM | POA: Diagnosis not present

## 2024-02-24 DIAGNOSIS — R262 Difficulty in walking, not elsewhere classified: Secondary | ICD-10-CM | POA: Diagnosis not present

## 2024-02-24 DIAGNOSIS — D509 Iron deficiency anemia, unspecified: Secondary | ICD-10-CM | POA: Diagnosis not present

## 2024-02-24 DIAGNOSIS — D631 Anemia in chronic kidney disease: Secondary | ICD-10-CM | POA: Diagnosis not present

## 2024-02-24 DIAGNOSIS — N186 End stage renal disease: Secondary | ICD-10-CM | POA: Diagnosis not present

## 2024-02-24 DIAGNOSIS — K5792 Diverticulitis of intestine, part unspecified, without perforation or abscess without bleeding: Secondary | ICD-10-CM | POA: Diagnosis not present

## 2024-02-24 DIAGNOSIS — Z992 Dependence on renal dialysis: Secondary | ICD-10-CM | POA: Diagnosis not present

## 2024-02-24 DIAGNOSIS — K31811 Angiodysplasia of stomach and duodenum with bleeding: Secondary | ICD-10-CM | POA: Insufficient documentation

## 2024-02-26 DIAGNOSIS — Z992 Dependence on renal dialysis: Secondary | ICD-10-CM | POA: Diagnosis not present

## 2024-02-26 DIAGNOSIS — D509 Iron deficiency anemia, unspecified: Secondary | ICD-10-CM | POA: Diagnosis not present

## 2024-02-26 DIAGNOSIS — N2581 Secondary hyperparathyroidism of renal origin: Secondary | ICD-10-CM | POA: Diagnosis not present

## 2024-02-26 DIAGNOSIS — N186 End stage renal disease: Secondary | ICD-10-CM | POA: Diagnosis not present

## 2024-02-26 DIAGNOSIS — D631 Anemia in chronic kidney disease: Secondary | ICD-10-CM | POA: Diagnosis not present

## 2024-02-29 ENCOUNTER — Encounter: Payer: Self-pay | Admitting: *Deleted

## 2024-02-29 DIAGNOSIS — Z992 Dependence on renal dialysis: Secondary | ICD-10-CM | POA: Diagnosis not present

## 2024-02-29 DIAGNOSIS — D509 Iron deficiency anemia, unspecified: Secondary | ICD-10-CM | POA: Diagnosis not present

## 2024-02-29 DIAGNOSIS — N186 End stage renal disease: Secondary | ICD-10-CM | POA: Diagnosis not present

## 2024-02-29 DIAGNOSIS — D631 Anemia in chronic kidney disease: Secondary | ICD-10-CM | POA: Diagnosis not present

## 2024-02-29 DIAGNOSIS — E785 Hyperlipidemia, unspecified: Secondary | ICD-10-CM | POA: Insufficient documentation

## 2024-02-29 DIAGNOSIS — M7989 Other specified soft tissue disorders: Secondary | ICD-10-CM | POA: Diagnosis not present

## 2024-02-29 DIAGNOSIS — E782 Mixed hyperlipidemia: Secondary | ICD-10-CM | POA: Insufficient documentation

## 2024-02-29 DIAGNOSIS — N2581 Secondary hyperparathyroidism of renal origin: Secondary | ICD-10-CM | POA: Diagnosis not present

## 2024-03-01 ENCOUNTER — Encounter: Payer: Self-pay | Admitting: Cardiology

## 2024-03-01 ENCOUNTER — Ambulatory Visit: Attending: Cardiology | Admitting: Cardiology

## 2024-03-01 VITALS — BP 110/52 | HR 56 | Ht 64.02 in | Wt 125.4 lb

## 2024-03-01 DIAGNOSIS — I6523 Occlusion and stenosis of bilateral carotid arteries: Secondary | ICD-10-CM

## 2024-03-01 DIAGNOSIS — Q2112 Patent foramen ovale: Secondary | ICD-10-CM

## 2024-03-01 DIAGNOSIS — N186 End stage renal disease: Secondary | ICD-10-CM

## 2024-03-01 DIAGNOSIS — I10 Essential (primary) hypertension: Secondary | ICD-10-CM | POA: Diagnosis not present

## 2024-03-01 DIAGNOSIS — I4729 Other ventricular tachycardia: Secondary | ICD-10-CM

## 2024-03-01 DIAGNOSIS — Z992 Dependence on renal dialysis: Secondary | ICD-10-CM | POA: Diagnosis not present

## 2024-03-01 DIAGNOSIS — I2510 Atherosclerotic heart disease of native coronary artery without angina pectoris: Secondary | ICD-10-CM | POA: Diagnosis not present

## 2024-03-01 DIAGNOSIS — M545 Low back pain, unspecified: Secondary | ICD-10-CM | POA: Diagnosis not present

## 2024-03-01 DIAGNOSIS — I5020 Unspecified systolic (congestive) heart failure: Secondary | ICD-10-CM

## 2024-03-01 DIAGNOSIS — I5032 Chronic diastolic (congestive) heart failure: Secondary | ICD-10-CM

## 2024-03-01 NOTE — Patient Instructions (Signed)
 Medication Instructions:  Your physician recommends that you continue on your current medications as directed. Please refer to the Current Medication list given to you today.  *If you need a refill on your cardiac medications before your next appointment, please call your pharmacy*   Lab Work: None Ordered If you have labs (blood work) drawn today and your tests are completely normal, you will receive your results only by: MyChart Message (if you have MyChart) OR A paper copy in the mail If you have any lab test that is abnormal or we need to change your treatment, we will call you to review the results.   Testing/Procedures: None Ordered   Follow-Up: At [facility], you and your health needs are our priority.  As part of our continuing mission to provide you with exceptional heart care, we have created designated Provider Care Teams.  These Care Teams include your primary Cardiologist (physician) and Advanced Practice Providers (APPs -  Physician Assistants and Nurse Practitioners) who all work together to provide you with the care you need, when you need it.  We recommend signing up for the patient portal called "MyChart".  Sign up information is provided on this After Visit Summary.  MyChart is used to connect with patients for Virtual Visits (Telemedicine).  Patients are able to view lab/test results, encounter notes, upcoming appointments, etc.  Non-urgent messages can be sent to your provider as well.   To learn more about what you can do with MyChart, go to [email or web address].    Your next appointment:   3 month(s)  The format for your next appointment:   In Person  Provider:   Wallis Bamberg, NP   Other Instructions NA

## 2024-03-01 NOTE — Progress Notes (Signed)
 Cardiology Office Note:  .   Date:  03/01/2024  ID:  Cassandra Baker, DOB 1941/07/07, MRN [id number] PCP: Charlott Rakes, MD   HeartCare Providers Cardiologist:  Julien Nordmann, MD    History of Present Illness: [name] Kitchen   Cassandra Baker is a 83 y.o. 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  02/18/2024 echo EF 60-65 %, trivial to mild MR, bubble study mildly positive with right-to-left shunting 02/16/2024 Carotid duplex right ICA 50 to 75%, left ICA 0 to 49%  11/27/2023 monitor average heart rate 47 bpm, triggered events associated with normal sinus rhythm, rare SVE, rare VE 09/08/2023 carotid duplex mild bilateral carotid artery stenosis 08/20/2023 echo EF 60-65%, mild LVH, grade 2 DD, mild aortic stenosis 06/17/2023 cardiac catheterization intravascular lithotripsy with 2 overlapping DES to the mid and proximal RCA 06/15/2023 left heart cath severe three-vessel CAD, culprit felt to be subtotal occlusion of the RCA  Most recently evaluated by Eula Listen PA-C on 11/27/2023, this was following several admissions and ED visits for syncope, she was overall stable from a cardiac perspective.  A monitor was arranged secondary to syncopal episode and previous NSVT, was advised to follow-up in 3 months.  She was admitted on 02/08/2024 to 02/13/2024 to [facility] for gum bleeding and has missed HD x 7 days. Hemoglobin was down to 7.9 with plans to monitor as outpatient, eventually d/c'd with home health services.  She was then admitted to [facility] with norovirus and infectious diverticulosis.  While she was at [facility] she advised that she had some transient weakness several weeks prior which prompted a CT of her head--which was negative for any intracranial abnormalities.  She underwent a carotid duplex which revealed carotid  artery stenosis. She also underwent a bubble study which revealed a single bubble R>L. She was subsequently discharged to SNF for rehab.  She presents today accompanied by her granddaughter for follow-up after recent hospitalization as outlined above.  She is feeling much better however not quite back to her baseline.  She did start walking with PT today and this was reassuring for her.  She is complying with hemodialysis, on Monday Wednesday Friday, this had previously been a concern for her is she was really struggling with being able to sit through her entire dialysis. She denies chest pain, palpitations, dyspnea, pnd, orthopnea, n, v, dizziness, syncope, edema, weight gain, or early satiety.   ROS: Review of Systems  Constitutional:  Positive for malaise/fatigue.  Cardiovascular:  Positive for leg swelling.  All other systems reviewed and are negative.    Studies Reviewed: .        Cardiac Studies & Procedures   ______________________________________________________________________________________________ CARDIAC CATHETERIZATION  CARDIAC CATHETERIZATION 06/17/2023  Narrative   Mid LAD lesion is 80% stenosed.   Mid Cx to Dist Cx lesion is 90% stenosed.   Ramus lesion is 60% stenosed.   RPDA lesion is 90% stenosed.   Prox RCA lesion is 80% stenosed.   Prox RCA to Mid RCA lesion is 99% stenosed.   Mid RCA lesion is 70% stenosed.   RPAV lesion is 60% stenosed.   A drug-eluting stent was successfully placed using a SYNERGY XD 3.0X48.   A drug-eluting stent was successfully placed using a SYNERGY XD 3.50X16.   Post intervention, there is a 0% residual stenosis.   Post intervention,  there is a 0% residual stenosis.   Post intervention, there is a 0% residual stenosis.  Moderately to severely elevated left ventricular end-diastolic pressure at 32 mmHg. 2. Successful complex intravascular lithotripsy and 2 overlapped drug-eluting stent placement to the mid and proximal right coronary  artery.  Very difficult procedure overall due to heavy calcifications and difficulty crossing the stenosis in the mid right coronary artery.  The distal RCA bifurcation disease appeared better than before likely due to improved upstream flow and thus I elected not to treat. 3.  The patient did go into polymorphic ventricular tachycardia that lasted for about 10 seconds before starting the procedure.  She converted and did not require defibrillation. 4.  Difficulty sedating the patient with continuous involuntary movements of her lower extremities throughout the case.  Recommendations: Dual antiplatelet therapy for at least 12 months. I discontinued heparin drip. I switch amiodarone drip to oral. Recommend treating the LAD disease medically.  Findings Coronary Findings Diagnostic  Dominance: Right  Left Main The vessel exhibits minimal luminal irregularities.  Left Anterior Descending Mid LAD lesion is 80% stenosed.  First Septal Branch Vessel is large in size.  Second Diagonal Branch Vessel is small in size. There is moderate disease in the vessel.  Third Diagonal Branch Vessel is angiographically normal.  Ramus Intermedius Ramus lesion is 60% stenosed.  Left Circumflex Mid Cx to Dist Cx lesion is 90% stenosed.  First Obtuse Marginal Branch Vessel is small in size.  Third Obtuse Marginal Branch Vessel is small in size. Vessel is angiographically normal.  Right Coronary Artery Prox RCA lesion is 80% stenosed. The lesion is type C. The lesion is severely calcified. Prox RCA to Mid RCA lesion is 99% stenosed. The lesion is type C. The lesion is severely calcified. Mid RCA lesion is 70% stenosed. The lesion is type C. The lesion is severely calcified.  Right Posterior Descending Artery Collaterals RPDA filled by collaterals from Dist LAD.  RPDA lesion is 90% stenosed.  Right Posterior Atrioventricular Artery RPAV lesion is 60% stenosed.  Intervention  Prox RCA  lesion Stent Lesion length:  13 mm. A drug-eluting stent was successfully placed using a SYNERGY XD 3.50X16. Maximum pressure: 16 atm. Inflation time: 20 sec. Post-stent angioplasty was performed using a BALLN [place] EMERGE MR 3.5X30. Maximum pressure:  18 atm. Inflation time:  20 sec. Post-Intervention Lesion Assessment The intervention was successful. Pre-interventional TIMI flow is 3. Post-intervention TIMI flow is 3. No complications occurred at this lesion. There is a 0% residual stenosis post intervention.  Prox RCA to Mid RCA lesion Stent (Also treats lesions: Mid RCA) Lesion length:  40 mm. CATH LAUNCHER [id number] guide catheter was inserted. Lesion crossed with guidewire using a WIRE ASAHI FIELDER XT 190CM. Pre-stent angioplasty was performed using a BALLN SCOREFLEX 2.75X15. Maximum pressure:  10 atm. Inflation time:  30 sec. A drug-eluting stent was successfully placed using a SYNERGY XD 3.0X48. Maximum pressure: 14 atm. Inflation time: 20 sec. Post-stent angioplasty was performed using a BALLN  EMERGE MR 3.5X30. Maximum pressure:  18 atm. Inflation time:  30 sec. I could not cross the mid RCA lesion with a run-through wire.  I was able to cross with a Fielder XT wire.  I could not cross with a 2.0 mm balloon.  I used a 2.0 [place] balloon but was not able to cross either.  I was able to cross with a 1.5 mmTekeru balloon with difficulty and was able to inflate to 12 atm.  I  was then able to deliver the 2.0 balloon.  I then dilated the whole lesion with a score flex balloon.  In order to be able to deliver the balloon, I had to use a guide liner extension catheter which was placed in the mid RCA. The lesion was not fully expanded and thus I decided to proceed with intravascular lithotripsy before stent placement.  I used a 3.0 x 12 mm balloon and delivered 7 treatments to the whole mid and proximal right coronary artery. Post-Intervention Lesion Assessment The intervention was successful.  Pre-interventional TIMI flow is 2. Post-intervention TIMI flow is 3. No complications occurred at this lesion. There is a 0% residual stenosis post intervention.  Mid RCA lesion Stent (Also treats lesions: Prox RCA to Mid RCA) See details in Prox RCA to Mid RCA lesion. Post-Intervention Lesion Assessment The intervention was successful. Pre-interventional TIMI flow is 2. Post-intervention TIMI flow is 3. No complications occurred at this lesion. There is a 0% residual stenosis post intervention.   CARDIAC CATHETERIZATION  CARDIAC CATHETERIZATION 06/15/2023  Narrative   Mid LAD lesion is 80% stenosed.   Mid Cx to Dist Cx lesion is 90% stenosed.   Ramus lesion is 60% stenosed.   Prox RCA to Mid RCA lesion is 95% stenosed.   Mid RCA to Dist RCA lesion is 60% stenosed.   RPDA lesion is 90% stenosed.   RPAV lesion is 80% stenosed.   There is moderate to severe left ventricular systolic dysfunction.   LV end diastolic pressure is mildly elevated.   The left ventricular ejection fraction is 25-35% by visual estimate.  1.  Severe three-vessel coronary artery disease.  The culprit for myocardial infarction is likely subtotal occlusion of the right coronary artery which is heavily calcified and diffusely diseased in the proximal/mid segment.  In addition, there is complex bifurcation disease at the right PDA and right posterior AV groove artery. 2.  Mildly with severely reduced LV systolic function with an EF of 30% with severe inferior and apical hypokinesis. 3.  Right heart catheterization showed mildly elevated filling pressures, mild pulmonary hypertension and normal cardiac output.  No significant V waves noted on wedge tracing.  Recommendations: Difficult revascularization options.  The patient has surgical disease but she might not be a candidate for CABG.  The RCA is the culprit but it is diffusely diseased and heavily calcified.  Will discuss with patient and family and consider  transfer to [facility] for evaluation.  If we decide to PCI, it is preferable to transfuse her to get her hemoglobin above 9 and load with an antiplatelet medication.  Findings Coronary Findings Diagnostic  Dominance: Right  Left Main The vessel exhibits minimal luminal irregularities.  Left Anterior Descending Mid LAD lesion is 80% stenosed.  First Septal Branch Vessel is large in size.  Second Diagonal Branch Vessel is small in size. There is moderate disease in the vessel.  Third Diagonal Branch Vessel is angiographically normal.  Ramus Intermedius Ramus lesion is 60% stenosed.  Left Circumflex Mid Cx to Dist Cx lesion is 90% stenosed.  First Obtuse Marginal Branch Vessel is small in size.  Third Obtuse Marginal Branch Vessel is small in size. Vessel is angiographically normal.  Right Coronary Artery Prox RCA to Mid RCA lesion is 95% stenosed. The lesion is type C. The lesion is severely calcified. Mid RCA to Dist RCA lesion is 60% stenosed.  Right Posterior Descending Artery Collaterals RPDA filled by collaterals from Dist LAD.  RPDA lesion  is 90% stenosed.  Right Posterior Atrioventricular Artery RPAV lesion is 80% stenosed.  Intervention  No interventions have been documented.   STRESS TESTS  NM MYOCAR MULTI W/SPECT W 06/05/2023  Narrative Pharmacological myocardial perfusion imaging study with no significant  ischemia Large fixed defect in the inferior and inferolateral wall consistent with prior MI Hypokinesis of the inferior and inferolateral wall, EF estimated at 31% EKG changes concerning for ischemia at peak stress/recovery (diffuse ST elevations in the inferior and anterolateral leads V3 through V6. CT attenuation correction images with mild diffuse aortic atherosclerosis, coronary calcification, predominantly in the RCA High risk scan in the setting of large infarct   Signed, Dossie Arbour, MD, Ph.D [facility]    ECHOCARDIOGRAM  ECHOCARDIOGRAM COMPLETE 08/20/2023  Narrative ECHOCARDIOGRAM REPORT    Patient Name:   Cassandra Baker Date of Exam: 08/20/2023 Medical Rec #:  [id number]      Height:       64.0 in Accession #:    [id number]     Weight:       151.5 lb Date of Birth:  17-Oct-1941      BSA:          1.739 m Patient Age:    81 years       BP:           136/67 mmHg Patient Gender: F              HR:           77 bpm. 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 Chronic combined CHF, CAD and Previous Myocardial Infarction, ESRD; Risk Factors:Diabetes and Hypertension. Previous LVEF 35% and mild LVH.  Sonographer:    Chanetta Marshall BA, RDCS Referring Phys: [id number] TESSA N CONTE  IMPRESSIONS   1. Fixed left coronary cusp with mild AS. 2. Left ventricular ejection fraction, by estimation, is 60 to 65%. The left ventricle has normal function. The left ventricle has no regional wall motion abnormalities. There is mild left ventricular hypertrophy. Left ventricular diastolic parameters are consistent with Grade II diastolic dysfunction (pseudonormalization). 3. Right ventricular systolic function is normal. The right ventricular size is normal. 4. The mitral valve is normal in structure. Trivial mitral valve regurgitation. No evidence of mitral stenosis. 5. The aortic valve is calcified. Aortic valve regurgitation is trivial. Mild aortic valve stenosis. 6. The inferior vena cava is normal in size with greater than 50% respiratory variability, suggesting right atrial pressure of 3 mmHg.  FINDINGS Left Ventricle: Left ventricular ejection fraction, by estimation, is 60 to 65%. The left ventricle has normal function. The left ventricle has no regional wall motion abnormalities. The left  ventricular internal cavity size was normal in size. There is mild left ventricular hypertrophy. Left ventricular diastolic parameters are consistent with Grade II diastolic dysfunction (pseudonormalization).  Right Ventricle: The right ventricular size is normal. Right ventricular systolic function is normal.  Left Atrium: Left atrial size was normal in size.  Right Atrium: Right atrial size was normal in size.  Pericardium: There is no evidence of pericardial effusion.  Mitral Valve: The mitral valve is normal in structure. Trivial mitral valve regurgitation. No evidence of mitral valve stenosis.  Tricuspid Valve: The tricuspid valve is normal in structure. Tricuspid valve regurgitation is  trivial. No evidence of tricuspid stenosis.  Aortic Valve: The aortic valve is calcified. Aortic valve regurgitation is trivial. Mild aortic stenosis is present. Aortic valve mean gradient measures 16.0 mmHg. Aortic valve peak gradient measures 25.6 mmHg. Aortic valve area, by VTI measures 1.15 cm.  Pulmonic Valve: The pulmonic valve was normal in structure. Pulmonic valve regurgitation is not visualized. No evidence of pulmonic stenosis.  Aorta: The aortic root is normal in size and structure.  Venous: The inferior vena cava is normal in size with greater than 50% respiratory variability, suggesting right atrial pressure of 3 mmHg.  IAS/Shunts: No atrial level shunt detected by color flow Doppler.  Additional Comments: Fixed left coronary cusp with mild AS.   LEFT VENTRICLE PLAX 2D LVIDd:         4.60 cm   Diastology LVIDs:         3.00 cm   LV e' medial:    7.62 cm/s LV PW:         1.30 cm   LV E/e' medial:  14.8 LV IVS:        1.20 cm   LV e' lateral:   11.00 cm/s LVOT diam:     1.80 cm   LV E/e' lateral: 10.3 LV SV:         61 LV SV Index:   35        2D Longitudinal Strain LVOT Area:     2.54 cm  2D Strain GLS (A2C):   -17.4 % 2D Strain GLS (A3C):   -15.6 % 2D Strain GLS (A4C):    -16.5 % 2D Strain GLS Avg:     -16.5 %  3D Volume EF: 3D EF:        68 % LV EDV:       109 ml LV ESV:       36 ml LV SV:        74 ml  RIGHT VENTRICLE             IVC RV Basal diam:  3.90 cm     IVC diam: 1.80 cm RV Mid diam:    2.60 cm RV S prime:     16.90 cm/s TAPSE (M-mode): 2.7 cm  LEFT ATRIUM             Index        RIGHT ATRIUM           Index LA diam:        4.30 cm 2.47 cm/m   RA Area:     14.60 cm LA Vol (A2C):   40.5 ml 23.30 ml/m  RA Volume:   38.10 ml  21.91 ml/m LA Vol (A4C):   37.2 ml 21.40 ml/m LA Biplane Vol: 39.0 ml 22.43 ml/m AORTIC VALVE AV Area (Vmax):    1.38 cm AV Area (Vmean):   1.31 cm AV Area (VTI):     1.15 cm AV Vmax:           253.00 cm/s AV Vmean:          178.667 cm/s AV VTI:            0.529 m AV Peak Grad:      25.6 mmHg AV Mean Grad:      16.0 mmHg LVOT Vmax:         137.00 cm/s LVOT Vmean:        92.000 cm/s LVOT VTI:  0.240 m LVOT/AV VTI ratio: 0.45  AORTA Ao Root diam: 2.60 cm Ao Asc diam:  2.90 cm  MITRAL VALVE MV Area (PHT): 3.34 cm     SHUNTS MV Decel Time: 227 msec     Systemic VTI:  0.24 m MV E velocity: 113.00 cm/s  Systemic Diam: 1.80 cm MV A velocity: 110.00 cm/s MV E/A ratio:  1.03  Olga Millers MD Electronically signed by Olga Millers MD Signature Date/Time: 08/20/2023/3:53:20 PM    Final    MONITORS  LONG TERM MONITOR-LIVE TELEMETRY (3-14 DAYS) 01/07/2024  Narrative Event monitor Patch Wear Time:  14 days and 0 hours (2024-12-20T11:13:14-0500 to 2025-01-03T11:13:14-0500)  Normal sinus rhythm Patient had a min HR of 39 bpm, max HR of 66 bpm, and avg HR of 47 bpm.  Isolated SVEs were rare (<1.0%), SVE Couplets were rare (<1.0%), and no SVE Triplets were present. Isolated VEs were rare (<1.0%), VE Couplets were rare (<1.0%), and no VE Triplets were present.  Patient triggered events (13) associated with normal sinus rhythm        ______________________________________________________________________________________________      Risk Assessment/Calculations:             Physical Exam:   VS:  BP (!) 110/52   Pulse (!) 56   Ht 5' 4.02" (1.626 m)   Wt 125 lb 6.4 oz (56.9 kg)   LMP  (LMP Unknown)   SpO2 96%   BMI 21.51 kg/m    Wt Readings from Last 3 Encounters:  03/01/24 125 lb 6.4 oz (56.9 kg)  02/12/24 106 lb 11.2 oz (48.4 kg)  11/27/23 135 lb 9.6 oz (61.5 kg)    GEN: Chronically ill-appearing, wheelchair-bound, no acute distress NECK: No JVD; No carotid bruits CARDIAC: RRR, no murmurs, rubs, gallops RESPIRATORY:  Clear to auscultation without rales, wheezing or rhonchi  ABDOMEN: Soft, non-tender, non-distended EXTREMITIES:  No edema; No deformity   ASSESSMENT AND PLAN: .    CAD-left heart cath in 06/11/2023 cardiac catheterization intravascular lithotripsy with 2 overlapping DES to the mid and proximal RCA.  Continue aspirin 81 mg daily, continue Plavix 75 mg daily, continue metoprolol 12.5 mg twice daily, continue nitroglycerin as needed.  Small PFO with TIA symptoms -she had apparently had some transient weakness several weeks prior to admission [facility], CT of the head was unrevealing, carotid duplex revealed stenosis.  She has been referred to vascular surgery at [facility] and is waiting to hear from them.  Currently on aspirin 81 mg daily and Plavix 75 mg daily.  She is asymptomatic, currently wheelchair bound on ESRD. Discussed with DOD, no further treatment recommended at this time.   Carotid artery stenosis-was referred to [facility] vascular.  Results as outlined above in the HPI.  Continue aspirin 81 mg daily, continue Plavix 75 mg daily, most recent LDL was controlled at 70.  ESRD - on HF Monday, Wednesday and Friday. Encouraged compliance.   NSVT - quiescent, continue amiodarone 200 mg daily. LFTs normal on 02/08/24, TSH normal.        Dispo: Follow up in 3 months with Dr. Bing Matter.    Signed, Flossie Dibble, NP

## 2024-03-02 DIAGNOSIS — N2581 Secondary hyperparathyroidism of renal origin: Secondary | ICD-10-CM | POA: Diagnosis not present

## 2024-03-02 DIAGNOSIS — Z992 Dependence on renal dialysis: Secondary | ICD-10-CM | POA: Diagnosis not present

## 2024-03-02 DIAGNOSIS — N186 End stage renal disease: Secondary | ICD-10-CM | POA: Diagnosis not present

## 2024-03-02 DIAGNOSIS — D509 Iron deficiency anemia, unspecified: Secondary | ICD-10-CM | POA: Diagnosis not present

## 2024-03-02 DIAGNOSIS — D631 Anemia in chronic kidney disease: Secondary | ICD-10-CM | POA: Diagnosis not present

## 2024-03-03 DIAGNOSIS — D649 Anemia, unspecified: Secondary | ICD-10-CM | POA: Diagnosis not present

## 2024-03-03 DIAGNOSIS — A0811 Acute gastroenteropathy due to Norwalk agent: Secondary | ICD-10-CM | POA: Diagnosis not present

## 2024-03-03 DIAGNOSIS — K5792 Diverticulitis of intestine, part unspecified, without perforation or abscess without bleeding: Secondary | ICD-10-CM | POA: Diagnosis not present

## 2024-03-03 DIAGNOSIS — N19 Unspecified kidney failure: Secondary | ICD-10-CM | POA: Diagnosis not present

## 2024-03-04 DIAGNOSIS — N2581 Secondary hyperparathyroidism of renal origin: Secondary | ICD-10-CM | POA: Diagnosis not present

## 2024-03-04 DIAGNOSIS — D631 Anemia in chronic kidney disease: Secondary | ICD-10-CM | POA: Diagnosis not present

## 2024-03-04 DIAGNOSIS — Z992 Dependence on renal dialysis: Secondary | ICD-10-CM | POA: Diagnosis not present

## 2024-03-04 DIAGNOSIS — D509 Iron deficiency anemia, unspecified: Secondary | ICD-10-CM | POA: Diagnosis not present

## 2024-03-04 DIAGNOSIS — N186 End stage renal disease: Secondary | ICD-10-CM | POA: Diagnosis not present

## 2024-03-07 DIAGNOSIS — Z992 Dependence on renal dialysis: Secondary | ICD-10-CM | POA: Diagnosis not present

## 2024-03-07 DIAGNOSIS — N2581 Secondary hyperparathyroidism of renal origin: Secondary | ICD-10-CM | POA: Diagnosis not present

## 2024-03-07 DIAGNOSIS — D631 Anemia in chronic kidney disease: Secondary | ICD-10-CM | POA: Diagnosis not present

## 2024-03-07 DIAGNOSIS — D509 Iron deficiency anemia, unspecified: Secondary | ICD-10-CM | POA: Diagnosis not present

## 2024-03-07 DIAGNOSIS — N186 End stage renal disease: Secondary | ICD-10-CM | POA: Diagnosis not present

## 2024-03-09 DIAGNOSIS — N186 End stage renal disease: Secondary | ICD-10-CM | POA: Diagnosis not present

## 2024-03-09 DIAGNOSIS — N2581 Secondary hyperparathyroidism of renal origin: Secondary | ICD-10-CM | POA: Diagnosis not present

## 2024-03-09 DIAGNOSIS — D631 Anemia in chronic kidney disease: Secondary | ICD-10-CM | POA: Diagnosis not present

## 2024-03-09 DIAGNOSIS — Z992 Dependence on renal dialysis: Secondary | ICD-10-CM | POA: Diagnosis not present

## 2024-03-09 DIAGNOSIS — D509 Iron deficiency anemia, unspecified: Secondary | ICD-10-CM | POA: Diagnosis not present

## 2024-03-11 DIAGNOSIS — D631 Anemia in chronic kidney disease: Secondary | ICD-10-CM | POA: Diagnosis not present

## 2024-03-11 DIAGNOSIS — N2581 Secondary hyperparathyroidism of renal origin: Secondary | ICD-10-CM | POA: Diagnosis not present

## 2024-03-11 DIAGNOSIS — Z992 Dependence on renal dialysis: Secondary | ICD-10-CM | POA: Diagnosis not present

## 2024-03-11 DIAGNOSIS — D509 Iron deficiency anemia, unspecified: Secondary | ICD-10-CM | POA: Diagnosis not present

## 2024-03-11 DIAGNOSIS — N186 End stage renal disease: Secondary | ICD-10-CM | POA: Diagnosis not present

## 2024-03-14 DIAGNOSIS — D509 Iron deficiency anemia, unspecified: Secondary | ICD-10-CM | POA: Diagnosis not present

## 2024-03-14 DIAGNOSIS — Z992 Dependence on renal dialysis: Secondary | ICD-10-CM | POA: Diagnosis not present

## 2024-03-14 DIAGNOSIS — N2581 Secondary hyperparathyroidism of renal origin: Secondary | ICD-10-CM | POA: Diagnosis not present

## 2024-03-14 DIAGNOSIS — N186 End stage renal disease: Secondary | ICD-10-CM | POA: Diagnosis not present

## 2024-03-14 DIAGNOSIS — D631 Anemia in chronic kidney disease: Secondary | ICD-10-CM | POA: Diagnosis not present

## 2024-03-16 DIAGNOSIS — R338 Other retention of urine: Secondary | ICD-10-CM | POA: Diagnosis not present

## 2024-03-16 DIAGNOSIS — Z992 Dependence on renal dialysis: Secondary | ICD-10-CM | POA: Diagnosis not present

## 2024-03-16 DIAGNOSIS — M7989 Other specified soft tissue disorders: Secondary | ICD-10-CM | POA: Diagnosis not present

## 2024-03-16 DIAGNOSIS — Z978 Presence of other specified devices: Secondary | ICD-10-CM | POA: Diagnosis not present

## 2024-03-16 DIAGNOSIS — N186 End stage renal disease: Secondary | ICD-10-CM | POA: Diagnosis not present

## 2024-03-17 DIAGNOSIS — N2581 Secondary hyperparathyroidism of renal origin: Secondary | ICD-10-CM | POA: Diagnosis not present

## 2024-03-17 DIAGNOSIS — N186 End stage renal disease: Secondary | ICD-10-CM | POA: Diagnosis not present

## 2024-03-17 DIAGNOSIS — D509 Iron deficiency anemia, unspecified: Secondary | ICD-10-CM | POA: Diagnosis not present

## 2024-03-17 DIAGNOSIS — Z992 Dependence on renal dialysis: Secondary | ICD-10-CM | POA: Diagnosis not present

## 2024-03-17 DIAGNOSIS — D631 Anemia in chronic kidney disease: Secondary | ICD-10-CM | POA: Diagnosis not present

## 2024-03-18 DIAGNOSIS — I6523 Occlusion and stenosis of bilateral carotid arteries: Secondary | ICD-10-CM | POA: Diagnosis not present

## 2024-03-21 DIAGNOSIS — D631 Anemia in chronic kidney disease: Secondary | ICD-10-CM | POA: Diagnosis not present

## 2024-03-21 DIAGNOSIS — Z992 Dependence on renal dialysis: Secondary | ICD-10-CM | POA: Diagnosis not present

## 2024-03-21 DIAGNOSIS — N186 End stage renal disease: Secondary | ICD-10-CM | POA: Diagnosis not present

## 2024-03-21 DIAGNOSIS — N2581 Secondary hyperparathyroidism of renal origin: Secondary | ICD-10-CM | POA: Diagnosis not present

## 2024-03-21 DIAGNOSIS — D509 Iron deficiency anemia, unspecified: Secondary | ICD-10-CM | POA: Diagnosis not present

## 2024-03-22 DIAGNOSIS — E1122 Type 2 diabetes mellitus with diabetic chronic kidney disease: Secondary | ICD-10-CM | POA: Diagnosis not present

## 2024-03-22 DIAGNOSIS — Z992 Dependence on renal dialysis: Secondary | ICD-10-CM | POA: Diagnosis not present

## 2024-03-22 DIAGNOSIS — N186 End stage renal disease: Secondary | ICD-10-CM | POA: Diagnosis not present

## 2024-03-23 DIAGNOSIS — N2581 Secondary hyperparathyroidism of renal origin: Secondary | ICD-10-CM | POA: Diagnosis not present

## 2024-03-23 DIAGNOSIS — Z992 Dependence on renal dialysis: Secondary | ICD-10-CM | POA: Diagnosis not present

## 2024-03-23 DIAGNOSIS — D509 Iron deficiency anemia, unspecified: Secondary | ICD-10-CM | POA: Diagnosis not present

## 2024-03-23 DIAGNOSIS — N186 End stage renal disease: Secondary | ICD-10-CM | POA: Diagnosis not present

## 2024-03-25 DIAGNOSIS — Z992 Dependence on renal dialysis: Secondary | ICD-10-CM | POA: Diagnosis not present

## 2024-03-25 DIAGNOSIS — N186 End stage renal disease: Secondary | ICD-10-CM | POA: Diagnosis not present

## 2024-03-25 DIAGNOSIS — N2581 Secondary hyperparathyroidism of renal origin: Secondary | ICD-10-CM | POA: Diagnosis not present

## 2024-03-25 DIAGNOSIS — D509 Iron deficiency anemia, unspecified: Secondary | ICD-10-CM | POA: Diagnosis not present

## 2024-03-28 DIAGNOSIS — D509 Iron deficiency anemia, unspecified: Secondary | ICD-10-CM | POA: Diagnosis not present

## 2024-03-28 DIAGNOSIS — Z992 Dependence on renal dialysis: Secondary | ICD-10-CM | POA: Diagnosis not present

## 2024-03-28 DIAGNOSIS — N2581 Secondary hyperparathyroidism of renal origin: Secondary | ICD-10-CM | POA: Diagnosis not present

## 2024-03-28 DIAGNOSIS — N186 End stage renal disease: Secondary | ICD-10-CM | POA: Diagnosis not present

## 2024-03-30 DIAGNOSIS — D509 Iron deficiency anemia, unspecified: Secondary | ICD-10-CM | POA: Diagnosis not present

## 2024-03-30 DIAGNOSIS — N186 End stage renal disease: Secondary | ICD-10-CM | POA: Diagnosis not present

## 2024-03-30 DIAGNOSIS — N2581 Secondary hyperparathyroidism of renal origin: Secondary | ICD-10-CM | POA: Diagnosis not present

## 2024-03-30 DIAGNOSIS — R0989 Other specified symptoms and signs involving the circulatory and respiratory systems: Secondary | ICD-10-CM | POA: Diagnosis not present

## 2024-03-30 DIAGNOSIS — Z992 Dependence on renal dialysis: Secondary | ICD-10-CM | POA: Diagnosis not present

## 2024-03-30 DIAGNOSIS — Z91158 Patient's noncompliance with renal dialysis for other reason: Secondary | ICD-10-CM | POA: Diagnosis not present

## 2024-03-31 DIAGNOSIS — Z91158 Patient's noncompliance with renal dialysis for other reason: Secondary | ICD-10-CM | POA: Diagnosis not present

## 2024-03-31 DIAGNOSIS — N186 End stage renal disease: Secondary | ICD-10-CM | POA: Diagnosis not present

## 2024-03-31 DIAGNOSIS — R079 Chest pain, unspecified: Secondary | ICD-10-CM | POA: Diagnosis not present

## 2024-03-31 DIAGNOSIS — Z789 Other specified health status: Secondary | ICD-10-CM | POA: Diagnosis not present

## 2024-03-31 DIAGNOSIS — Z992 Dependence on renal dialysis: Secondary | ICD-10-CM | POA: Diagnosis not present

## 2024-04-01 DIAGNOSIS — D509 Iron deficiency anemia, unspecified: Secondary | ICD-10-CM | POA: Diagnosis not present

## 2024-04-01 DIAGNOSIS — N186 End stage renal disease: Secondary | ICD-10-CM | POA: Diagnosis not present

## 2024-04-01 DIAGNOSIS — Z992 Dependence on renal dialysis: Secondary | ICD-10-CM | POA: Diagnosis not present

## 2024-04-01 DIAGNOSIS — N2581 Secondary hyperparathyroidism of renal origin: Secondary | ICD-10-CM | POA: Diagnosis not present

## 2024-04-04 DIAGNOSIS — N186 End stage renal disease: Secondary | ICD-10-CM | POA: Diagnosis not present

## 2024-04-04 DIAGNOSIS — Z992 Dependence on renal dialysis: Secondary | ICD-10-CM | POA: Diagnosis not present

## 2024-04-04 DIAGNOSIS — D509 Iron deficiency anemia, unspecified: Secondary | ICD-10-CM | POA: Diagnosis not present

## 2024-04-04 DIAGNOSIS — N2581 Secondary hyperparathyroidism of renal origin: Secondary | ICD-10-CM | POA: Diagnosis not present

## 2024-04-05 DIAGNOSIS — N186 End stage renal disease: Secondary | ICD-10-CM | POA: Diagnosis not present

## 2024-04-05 DIAGNOSIS — R5381 Other malaise: Secondary | ICD-10-CM | POA: Diagnosis not present

## 2024-04-05 DIAGNOSIS — Z91158 Patient's noncompliance with renal dialysis for other reason: Secondary | ICD-10-CM | POA: Diagnosis not present

## 2024-04-05 DIAGNOSIS — Z992 Dependence on renal dialysis: Secondary | ICD-10-CM | POA: Diagnosis not present

## 2024-04-12 DIAGNOSIS — D631 Anemia in chronic kidney disease: Secondary | ICD-10-CM | POA: Diagnosis not present

## 2024-04-12 DIAGNOSIS — Z992 Dependence on renal dialysis: Secondary | ICD-10-CM | POA: Diagnosis not present

## 2024-04-12 DIAGNOSIS — N2581 Secondary hyperparathyroidism of renal origin: Secondary | ICD-10-CM | POA: Diagnosis not present

## 2024-04-12 DIAGNOSIS — N186 End stage renal disease: Secondary | ICD-10-CM | POA: Diagnosis not present

## 2024-04-12 DIAGNOSIS — D508 Other iron deficiency anemias: Secondary | ICD-10-CM | POA: Diagnosis not present

## 2024-04-12 DIAGNOSIS — E1122 Type 2 diabetes mellitus with diabetic chronic kidney disease: Secondary | ICD-10-CM | POA: Diagnosis not present

## 2024-04-14 DIAGNOSIS — I10 Essential (primary) hypertension: Secondary | ICD-10-CM | POA: Diagnosis not present

## 2024-04-14 DIAGNOSIS — I2510 Atherosclerotic heart disease of native coronary artery without angina pectoris: Secondary | ICD-10-CM | POA: Diagnosis not present

## 2024-04-14 DIAGNOSIS — N186 End stage renal disease: Secondary | ICD-10-CM | POA: Diagnosis not present

## 2024-04-14 DIAGNOSIS — E1122 Type 2 diabetes mellitus with diabetic chronic kidney disease: Secondary | ICD-10-CM | POA: Diagnosis not present

## 2024-04-14 DIAGNOSIS — F3341 Major depressive disorder, recurrent, in partial remission: Secondary | ICD-10-CM | POA: Diagnosis not present

## 2024-04-14 DIAGNOSIS — E039 Hypothyroidism, unspecified: Secondary | ICD-10-CM | POA: Diagnosis not present

## 2024-04-14 DIAGNOSIS — W19XXXA Unspecified fall, initial encounter: Secondary | ICD-10-CM | POA: Diagnosis not present

## 2024-04-14 DIAGNOSIS — R4189 Other symptoms and signs involving cognitive functions and awareness: Secondary | ICD-10-CM | POA: Diagnosis not present

## 2024-04-14 DIAGNOSIS — D649 Anemia, unspecified: Secondary | ICD-10-CM | POA: Diagnosis not present

## 2024-04-14 DIAGNOSIS — I5030 Unspecified diastolic (congestive) heart failure: Secondary | ICD-10-CM | POA: Diagnosis not present

## 2024-04-14 DIAGNOSIS — E210 Primary hyperparathyroidism: Secondary | ICD-10-CM | POA: Diagnosis not present

## 2024-04-14 DIAGNOSIS — E7800 Pure hypercholesterolemia, unspecified: Secondary | ICD-10-CM | POA: Diagnosis not present

## 2024-04-15 DIAGNOSIS — M62551 Muscle wasting and atrophy, not elsewhere classified, right thigh: Secondary | ICD-10-CM | POA: Diagnosis not present

## 2024-04-15 DIAGNOSIS — M170 Bilateral primary osteoarthritis of knee: Secondary | ICD-10-CM | POA: Diagnosis not present

## 2024-04-15 DIAGNOSIS — R278 Other lack of coordination: Secondary | ICD-10-CM | POA: Diagnosis not present

## 2024-04-15 DIAGNOSIS — M62552 Muscle wasting and atrophy, not elsewhere classified, left thigh: Secondary | ICD-10-CM | POA: Diagnosis not present

## 2024-04-15 DIAGNOSIS — R296 Repeated falls: Secondary | ICD-10-CM | POA: Diagnosis not present

## 2024-04-17 DIAGNOSIS — J9611 Chronic respiratory failure with hypoxia: Secondary | ICD-10-CM | POA: Diagnosis not present

## 2024-04-17 DIAGNOSIS — E872 Acidosis, unspecified: Secondary | ICD-10-CM | POA: Diagnosis not present

## 2024-04-17 DIAGNOSIS — I10 Essential (primary) hypertension: Secondary | ICD-10-CM | POA: Diagnosis not present

## 2024-04-17 DIAGNOSIS — D649 Anemia, unspecified: Secondary | ICD-10-CM | POA: Diagnosis not present

## 2024-04-17 DIAGNOSIS — I959 Hypotension, unspecified: Secondary | ICD-10-CM | POA: Diagnosis not present

## 2024-04-17 DIAGNOSIS — I120 Hypertensive chronic kidney disease with stage 5 chronic kidney disease or end stage renal disease: Secondary | ICD-10-CM | POA: Diagnosis not present

## 2024-04-17 DIAGNOSIS — I110 Hypertensive heart disease with heart failure: Secondary | ICD-10-CM | POA: Diagnosis not present

## 2024-04-17 DIAGNOSIS — E8779 Other fluid overload: Secondary | ICD-10-CM | POA: Diagnosis not present

## 2024-04-17 DIAGNOSIS — Z5901 Sheltered homelessness: Secondary | ICD-10-CM | POA: Diagnosis not present

## 2024-04-17 DIAGNOSIS — R609 Edema, unspecified: Secondary | ICD-10-CM | POA: Diagnosis not present

## 2024-04-17 DIAGNOSIS — R0602 Shortness of breath: Secondary | ICD-10-CM | POA: Diagnosis not present

## 2024-04-17 DIAGNOSIS — J9601 Acute respiratory failure with hypoxia: Secondary | ICD-10-CM | POA: Diagnosis not present

## 2024-04-17 DIAGNOSIS — F329 Major depressive disorder, single episode, unspecified: Secondary | ICD-10-CM | POA: Diagnosis not present

## 2024-04-17 DIAGNOSIS — N186 End stage renal disease: Secondary | ICD-10-CM | POA: Diagnosis not present

## 2024-04-17 DIAGNOSIS — R45851 Suicidal ideations: Secondary | ICD-10-CM | POA: Diagnosis not present

## 2024-04-17 DIAGNOSIS — I4729 Other ventricular tachycardia: Secondary | ICD-10-CM | POA: Diagnosis not present

## 2024-04-17 DIAGNOSIS — Z8679 Personal history of other diseases of the circulatory system: Secondary | ICD-10-CM | POA: Diagnosis not present

## 2024-04-17 DIAGNOSIS — I132 Hypertensive heart and chronic kidney disease with heart failure and with stage 5 chronic kidney disease, or end stage renal disease: Secondary | ICD-10-CM | POA: Diagnosis not present

## 2024-04-17 DIAGNOSIS — R2689 Other abnormalities of gait and mobility: Secondary | ICD-10-CM | POA: Diagnosis not present

## 2024-04-17 DIAGNOSIS — Z5982 Transportation insecurity: Secondary | ICD-10-CM | POA: Diagnosis not present

## 2024-04-17 DIAGNOSIS — M79631 Pain in right forearm: Secondary | ICD-10-CM | POA: Diagnosis not present

## 2024-04-17 DIAGNOSIS — I2510 Atherosclerotic heart disease of native coronary artery without angina pectoris: Secondary | ICD-10-CM | POA: Diagnosis not present

## 2024-04-17 DIAGNOSIS — F339 Major depressive disorder, recurrent, unspecified: Secondary | ICD-10-CM | POA: Diagnosis not present

## 2024-04-17 DIAGNOSIS — S40021A Contusion of right upper arm, initial encounter: Secondary | ICD-10-CM | POA: Diagnosis not present

## 2024-04-17 DIAGNOSIS — E1122 Type 2 diabetes mellitus with diabetic chronic kidney disease: Secondary | ICD-10-CM | POA: Diagnosis not present

## 2024-04-17 DIAGNOSIS — R0902 Hypoxemia: Secondary | ICD-10-CM | POA: Diagnosis not present

## 2024-04-17 DIAGNOSIS — E8729 Other acidosis: Secondary | ICD-10-CM | POA: Diagnosis not present

## 2024-04-17 DIAGNOSIS — M6281 Muscle weakness (generalized): Secondary | ICD-10-CM | POA: Diagnosis not present

## 2024-04-17 DIAGNOSIS — N19 Unspecified kidney failure: Secondary | ICD-10-CM | POA: Diagnosis not present

## 2024-04-17 DIAGNOSIS — J90 Pleural effusion, not elsewhere classified: Secondary | ICD-10-CM | POA: Diagnosis not present

## 2024-04-17 DIAGNOSIS — Z91158 Patient's noncompliance with renal dialysis for other reason: Secondary | ICD-10-CM | POA: Diagnosis not present

## 2024-04-17 DIAGNOSIS — Z7982 Long term (current) use of aspirin: Secondary | ICD-10-CM | POA: Diagnosis not present

## 2024-04-17 DIAGNOSIS — F419 Anxiety disorder, unspecified: Secondary | ICD-10-CM | POA: Diagnosis not present

## 2024-04-17 DIAGNOSIS — F32A Depression, unspecified: Secondary | ICD-10-CM | POA: Diagnosis not present

## 2024-04-17 DIAGNOSIS — Z743 Need for continuous supervision: Secondary | ICD-10-CM | POA: Diagnosis not present

## 2024-04-17 DIAGNOSIS — Z9981 Dependence on supplemental oxygen: Secondary | ICD-10-CM | POA: Diagnosis not present

## 2024-04-17 DIAGNOSIS — J918 Pleural effusion in other conditions classified elsewhere: Secondary | ICD-10-CM | POA: Diagnosis not present

## 2024-04-17 DIAGNOSIS — Z789 Other specified health status: Secondary | ICD-10-CM | POA: Diagnosis not present

## 2024-04-17 DIAGNOSIS — I509 Heart failure, unspecified: Secondary | ICD-10-CM | POA: Diagnosis not present

## 2024-04-17 DIAGNOSIS — E039 Hypothyroidism, unspecified: Secondary | ICD-10-CM | POA: Diagnosis not present

## 2024-04-17 DIAGNOSIS — Z880 Allergy status to penicillin: Secondary | ICD-10-CM | POA: Diagnosis not present

## 2024-04-17 DIAGNOSIS — Z79899 Other long term (current) drug therapy: Secondary | ICD-10-CM | POA: Diagnosis not present

## 2024-04-17 DIAGNOSIS — R0600 Dyspnea, unspecified: Secondary | ICD-10-CM | POA: Diagnosis not present

## 2024-04-17 DIAGNOSIS — Z7409 Other reduced mobility: Secondary | ICD-10-CM | POA: Diagnosis not present

## 2024-04-17 DIAGNOSIS — S5011XA Contusion of right forearm, initial encounter: Secondary | ICD-10-CM | POA: Diagnosis not present

## 2024-04-17 DIAGNOSIS — Z992 Dependence on renal dialysis: Secondary | ICD-10-CM | POA: Diagnosis not present

## 2024-04-17 DIAGNOSIS — Z59819 Housing instability, housed unspecified: Secondary | ICD-10-CM | POA: Diagnosis not present

## 2024-04-17 DIAGNOSIS — K219 Gastro-esophageal reflux disease without esophagitis: Secondary | ICD-10-CM | POA: Diagnosis not present

## 2024-04-17 DIAGNOSIS — J811 Chronic pulmonary edema: Secondary | ICD-10-CM | POA: Diagnosis not present

## 2024-04-18 DIAGNOSIS — R45851 Suicidal ideations: Secondary | ICD-10-CM | POA: Diagnosis not present

## 2024-04-18 DIAGNOSIS — Z992 Dependence on renal dialysis: Secondary | ICD-10-CM | POA: Diagnosis not present

## 2024-04-18 DIAGNOSIS — J90 Pleural effusion, not elsewhere classified: Secondary | ICD-10-CM | POA: Diagnosis not present

## 2024-04-18 DIAGNOSIS — E8729 Other acidosis: Secondary | ICD-10-CM | POA: Diagnosis not present

## 2024-04-18 DIAGNOSIS — N186 End stage renal disease: Secondary | ICD-10-CM | POA: Diagnosis not present

## 2024-04-18 DIAGNOSIS — F329 Major depressive disorder, single episode, unspecified: Secondary | ICD-10-CM | POA: Diagnosis not present

## 2024-04-18 DIAGNOSIS — J9601 Acute respiratory failure with hypoxia: Secondary | ICD-10-CM | POA: Diagnosis not present

## 2024-04-18 DIAGNOSIS — Z789 Other specified health status: Secondary | ICD-10-CM | POA: Diagnosis not present

## 2024-04-18 DIAGNOSIS — Z8679 Personal history of other diseases of the circulatory system: Secondary | ICD-10-CM | POA: Diagnosis not present

## 2024-04-18 DIAGNOSIS — I10 Essential (primary) hypertension: Secondary | ICD-10-CM | POA: Diagnosis not present

## 2024-04-18 DIAGNOSIS — R0600 Dyspnea, unspecified: Secondary | ICD-10-CM | POA: Diagnosis not present

## 2024-04-18 DIAGNOSIS — E8779 Other fluid overload: Secondary | ICD-10-CM | POA: Diagnosis not present

## 2024-04-18 DIAGNOSIS — Z7409 Other reduced mobility: Secondary | ICD-10-CM | POA: Diagnosis not present

## 2024-04-18 DIAGNOSIS — N19 Unspecified kidney failure: Secondary | ICD-10-CM | POA: Diagnosis not present

## 2024-04-19 DIAGNOSIS — I10 Essential (primary) hypertension: Secondary | ICD-10-CM | POA: Diagnosis not present

## 2024-04-19 DIAGNOSIS — N19 Unspecified kidney failure: Secondary | ICD-10-CM | POA: Diagnosis not present

## 2024-04-19 DIAGNOSIS — E8729 Other acidosis: Secondary | ICD-10-CM | POA: Diagnosis not present

## 2024-04-19 DIAGNOSIS — Z789 Other specified health status: Secondary | ICD-10-CM | POA: Diagnosis not present

## 2024-04-19 DIAGNOSIS — Z7409 Other reduced mobility: Secondary | ICD-10-CM | POA: Diagnosis not present

## 2024-04-19 DIAGNOSIS — N186 End stage renal disease: Secondary | ICD-10-CM | POA: Diagnosis not present

## 2024-04-19 DIAGNOSIS — J90 Pleural effusion, not elsewhere classified: Secondary | ICD-10-CM | POA: Diagnosis not present

## 2024-04-20 DIAGNOSIS — N186 End stage renal disease: Secondary | ICD-10-CM | POA: Diagnosis not present

## 2024-04-20 DIAGNOSIS — Z7409 Other reduced mobility: Secondary | ICD-10-CM | POA: Diagnosis not present

## 2024-04-20 DIAGNOSIS — Z789 Other specified health status: Secondary | ICD-10-CM | POA: Diagnosis not present

## 2024-04-20 DIAGNOSIS — I10 Essential (primary) hypertension: Secondary | ICD-10-CM | POA: Diagnosis not present

## 2024-04-20 DIAGNOSIS — N19 Unspecified kidney failure: Secondary | ICD-10-CM | POA: Diagnosis not present

## 2024-04-20 DIAGNOSIS — E8729 Other acidosis: Secondary | ICD-10-CM | POA: Diagnosis not present

## 2024-04-20 DIAGNOSIS — J90 Pleural effusion, not elsewhere classified: Secondary | ICD-10-CM | POA: Diagnosis not present

## 2024-04-21 DIAGNOSIS — Z7409 Other reduced mobility: Secondary | ICD-10-CM | POA: Diagnosis not present

## 2024-04-21 DIAGNOSIS — E8729 Other acidosis: Secondary | ICD-10-CM | POA: Diagnosis not present

## 2024-04-21 DIAGNOSIS — N186 End stage renal disease: Secondary | ICD-10-CM | POA: Diagnosis not present

## 2024-04-21 DIAGNOSIS — Z992 Dependence on renal dialysis: Secondary | ICD-10-CM | POA: Diagnosis not present

## 2024-04-21 DIAGNOSIS — I10 Essential (primary) hypertension: Secondary | ICD-10-CM | POA: Diagnosis not present

## 2024-04-21 DIAGNOSIS — N19 Unspecified kidney failure: Secondary | ICD-10-CM | POA: Diagnosis not present

## 2024-04-21 DIAGNOSIS — E1122 Type 2 diabetes mellitus with diabetic chronic kidney disease: Secondary | ICD-10-CM | POA: Diagnosis not present

## 2024-04-21 DIAGNOSIS — Z789 Other specified health status: Secondary | ICD-10-CM | POA: Diagnosis not present

## 2024-04-21 DIAGNOSIS — J90 Pleural effusion, not elsewhere classified: Secondary | ICD-10-CM | POA: Diagnosis not present

## 2024-04-22 DIAGNOSIS — Z789 Other specified health status: Secondary | ICD-10-CM | POA: Diagnosis not present

## 2024-04-22 DIAGNOSIS — F5105 Insomnia due to other mental disorder: Secondary | ICD-10-CM | POA: Diagnosis not present

## 2024-04-22 DIAGNOSIS — J9610 Chronic respiratory failure, unspecified whether with hypoxia or hypercapnia: Secondary | ICD-10-CM | POA: Diagnosis not present

## 2024-04-22 DIAGNOSIS — J90 Pleural effusion, not elsewhere classified: Secondary | ICD-10-CM | POA: Diagnosis not present

## 2024-04-22 DIAGNOSIS — R609 Edema, unspecified: Secondary | ICD-10-CM | POA: Diagnosis not present

## 2024-04-22 DIAGNOSIS — F4323 Adjustment disorder with mixed anxiety and depressed mood: Secondary | ICD-10-CM | POA: Diagnosis not present

## 2024-04-22 DIAGNOSIS — R110 Nausea: Secondary | ICD-10-CM | POA: Diagnosis not present

## 2024-04-22 DIAGNOSIS — N2581 Secondary hyperparathyroidism of renal origin: Secondary | ICD-10-CM | POA: Diagnosis not present

## 2024-04-22 DIAGNOSIS — I5030 Unspecified diastolic (congestive) heart failure: Secondary | ICD-10-CM | POA: Diagnosis not present

## 2024-04-22 DIAGNOSIS — I509 Heart failure, unspecified: Secondary | ICD-10-CM | POA: Diagnosis not present

## 2024-04-22 DIAGNOSIS — Z9181 History of falling: Secondary | ICD-10-CM | POA: Diagnosis not present

## 2024-04-22 DIAGNOSIS — E8729 Other acidosis: Secondary | ICD-10-CM | POA: Diagnosis not present

## 2024-04-22 DIAGNOSIS — I10 Essential (primary) hypertension: Secondary | ICD-10-CM | POA: Diagnosis not present

## 2024-04-22 DIAGNOSIS — R0602 Shortness of breath: Secondary | ICD-10-CM | POA: Diagnosis not present

## 2024-04-22 DIAGNOSIS — K219 Gastro-esophageal reflux disease without esophagitis: Secondary | ICD-10-CM | POA: Diagnosis not present

## 2024-04-22 DIAGNOSIS — I2510 Atherosclerotic heart disease of native coronary artery without angina pectoris: Secondary | ICD-10-CM | POA: Diagnosis not present

## 2024-04-22 DIAGNOSIS — M79671 Pain in right foot: Secondary | ICD-10-CM | POA: Diagnosis not present

## 2024-04-22 DIAGNOSIS — F3341 Major depressive disorder, recurrent, in partial remission: Secondary | ICD-10-CM | POA: Diagnosis not present

## 2024-04-22 DIAGNOSIS — D508 Other iron deficiency anemias: Secondary | ICD-10-CM | POA: Diagnosis not present

## 2024-04-22 DIAGNOSIS — R4189 Other symptoms and signs involving cognitive functions and awareness: Secondary | ICD-10-CM | POA: Diagnosis not present

## 2024-04-22 DIAGNOSIS — R000 Tachycardia, unspecified: Secondary | ICD-10-CM | POA: Diagnosis not present

## 2024-04-22 DIAGNOSIS — K5900 Constipation, unspecified: Secondary | ICD-10-CM | POA: Diagnosis not present

## 2024-04-22 DIAGNOSIS — I4729 Other ventricular tachycardia: Secondary | ICD-10-CM | POA: Diagnosis not present

## 2024-04-22 DIAGNOSIS — R531 Weakness: Secondary | ICD-10-CM | POA: Diagnosis not present

## 2024-04-22 DIAGNOSIS — Z743 Need for continuous supervision: Secondary | ICD-10-CM | POA: Diagnosis not present

## 2024-04-22 DIAGNOSIS — F339 Major depressive disorder, recurrent, unspecified: Secondary | ICD-10-CM | POA: Diagnosis not present

## 2024-04-22 DIAGNOSIS — E8770 Fluid overload, unspecified: Secondary | ICD-10-CM | POA: Diagnosis not present

## 2024-04-22 DIAGNOSIS — E7800 Pure hypercholesterolemia, unspecified: Secondary | ICD-10-CM | POA: Diagnosis not present

## 2024-04-22 DIAGNOSIS — E785 Hyperlipidemia, unspecified: Secondary | ICD-10-CM | POA: Diagnosis not present

## 2024-04-22 DIAGNOSIS — I959 Hypotension, unspecified: Secondary | ICD-10-CM | POA: Diagnosis not present

## 2024-04-22 DIAGNOSIS — N6019 Diffuse cystic mastopathy of unspecified breast: Secondary | ICD-10-CM | POA: Diagnosis not present

## 2024-04-22 DIAGNOSIS — Z9981 Dependence on supplemental oxygen: Secondary | ICD-10-CM | POA: Diagnosis not present

## 2024-04-22 DIAGNOSIS — M79604 Pain in right leg: Secondary | ICD-10-CM | POA: Diagnosis not present

## 2024-04-22 DIAGNOSIS — D649 Anemia, unspecified: Secondary | ICD-10-CM | POA: Diagnosis not present

## 2024-04-22 DIAGNOSIS — D638 Anemia in other chronic diseases classified elsewhere: Secondary | ICD-10-CM | POA: Diagnosis not present

## 2024-04-22 DIAGNOSIS — E1122 Type 2 diabetes mellitus with diabetic chronic kidney disease: Secondary | ICD-10-CM | POA: Diagnosis not present

## 2024-04-22 DIAGNOSIS — L03115 Cellulitis of right lower limb: Secondary | ICD-10-CM | POA: Diagnosis not present

## 2024-04-22 DIAGNOSIS — N186 End stage renal disease: Secondary | ICD-10-CM | POA: Diagnosis not present

## 2024-04-22 DIAGNOSIS — W19XXXA Unspecified fall, initial encounter: Secondary | ICD-10-CM | POA: Diagnosis not present

## 2024-04-22 DIAGNOSIS — D631 Anemia in chronic kidney disease: Secondary | ICD-10-CM | POA: Diagnosis not present

## 2024-04-22 DIAGNOSIS — F411 Generalized anxiety disorder: Secondary | ICD-10-CM | POA: Diagnosis not present

## 2024-04-22 DIAGNOSIS — Z992 Dependence on renal dialysis: Secondary | ICD-10-CM | POA: Diagnosis not present

## 2024-04-22 DIAGNOSIS — R262 Difficulty in walking, not elsewhere classified: Secondary | ICD-10-CM | POA: Diagnosis not present

## 2024-04-22 DIAGNOSIS — E210 Primary hyperparathyroidism: Secondary | ICD-10-CM | POA: Diagnosis not present

## 2024-04-22 DIAGNOSIS — M6281 Muscle weakness (generalized): Secondary | ICD-10-CM | POA: Diagnosis not present

## 2024-04-22 DIAGNOSIS — E039 Hypothyroidism, unspecified: Secondary | ICD-10-CM | POA: Diagnosis not present

## 2024-04-22 DIAGNOSIS — Z7189 Other specified counseling: Secondary | ICD-10-CM | POA: Diagnosis not present

## 2024-04-22 DIAGNOSIS — R2689 Other abnormalities of gait and mobility: Secondary | ICD-10-CM | POA: Diagnosis not present

## 2024-04-22 DIAGNOSIS — M7989 Other specified soft tissue disorders: Secondary | ICD-10-CM | POA: Diagnosis not present

## 2024-04-22 DIAGNOSIS — J9611 Chronic respiratory failure with hypoxia: Secondary | ICD-10-CM | POA: Diagnosis not present

## 2024-04-22 DIAGNOSIS — Z91199 Patient's noncompliance with other medical treatment and regimen due to unspecified reason: Secondary | ICD-10-CM | POA: Diagnosis not present

## 2024-04-22 DIAGNOSIS — N19 Unspecified kidney failure: Secondary | ICD-10-CM | POA: Diagnosis not present

## 2024-04-22 DIAGNOSIS — F331 Major depressive disorder, recurrent, moderate: Secondary | ICD-10-CM | POA: Diagnosis not present

## 2024-04-22 DIAGNOSIS — F419 Anxiety disorder, unspecified: Secondary | ICD-10-CM | POA: Diagnosis not present

## 2024-04-22 DIAGNOSIS — G8929 Other chronic pain: Secondary | ICD-10-CM | POA: Diagnosis not present

## 2024-04-22 DIAGNOSIS — J189 Pneumonia, unspecified organism: Secondary | ICD-10-CM | POA: Diagnosis not present

## 2024-04-22 DIAGNOSIS — I1310 Hypertensive heart and chronic kidney disease without heart failure, with stage 1 through stage 4 chronic kidney disease, or unspecified chronic kidney disease: Secondary | ICD-10-CM | POA: Diagnosis not present

## 2024-04-22 DIAGNOSIS — R197 Diarrhea, unspecified: Secondary | ICD-10-CM | POA: Diagnosis not present

## 2024-04-22 DIAGNOSIS — Z23 Encounter for immunization: Secondary | ICD-10-CM | POA: Diagnosis not present

## 2024-04-22 DIAGNOSIS — Z79899 Other long term (current) drug therapy: Secondary | ICD-10-CM | POA: Diagnosis not present

## 2024-04-22 DIAGNOSIS — J918 Pleural effusion in other conditions classified elsewhere: Secondary | ICD-10-CM | POA: Diagnosis not present

## 2024-04-22 DIAGNOSIS — Z7409 Other reduced mobility: Secondary | ICD-10-CM | POA: Diagnosis not present

## 2024-04-23 DIAGNOSIS — F419 Anxiety disorder, unspecified: Secondary | ICD-10-CM | POA: Diagnosis not present

## 2024-04-23 DIAGNOSIS — N186 End stage renal disease: Secondary | ICD-10-CM | POA: Diagnosis not present

## 2024-04-23 DIAGNOSIS — Z7189 Other specified counseling: Secondary | ICD-10-CM | POA: Diagnosis not present

## 2024-04-23 DIAGNOSIS — R531 Weakness: Secondary | ICD-10-CM | POA: Diagnosis not present

## 2024-04-23 DIAGNOSIS — Z992 Dependence on renal dialysis: Secondary | ICD-10-CM | POA: Diagnosis not present

## 2024-04-24 DIAGNOSIS — Z7189 Other specified counseling: Secondary | ICD-10-CM | POA: Diagnosis not present

## 2024-04-24 DIAGNOSIS — R531 Weakness: Secondary | ICD-10-CM | POA: Diagnosis not present

## 2024-04-24 DIAGNOSIS — N186 End stage renal disease: Secondary | ICD-10-CM | POA: Diagnosis not present

## 2024-04-24 DIAGNOSIS — Z992 Dependence on renal dialysis: Secondary | ICD-10-CM | POA: Diagnosis not present

## 2024-04-25 DIAGNOSIS — E039 Hypothyroidism, unspecified: Secondary | ICD-10-CM | POA: Diagnosis not present

## 2024-04-25 DIAGNOSIS — Z992 Dependence on renal dialysis: Secondary | ICD-10-CM | POA: Diagnosis not present

## 2024-04-25 DIAGNOSIS — N186 End stage renal disease: Secondary | ICD-10-CM | POA: Diagnosis not present

## 2024-04-25 DIAGNOSIS — I2510 Atherosclerotic heart disease of native coronary artery without angina pectoris: Secondary | ICD-10-CM | POA: Diagnosis not present

## 2024-04-25 DIAGNOSIS — E8770 Fluid overload, unspecified: Secondary | ICD-10-CM | POA: Diagnosis not present

## 2024-04-26 DIAGNOSIS — D649 Anemia, unspecified: Secondary | ICD-10-CM | POA: Diagnosis not present

## 2024-04-26 DIAGNOSIS — Z23 Encounter for immunization: Secondary | ICD-10-CM | POA: Diagnosis not present

## 2024-04-26 DIAGNOSIS — E039 Hypothyroidism, unspecified: Secondary | ICD-10-CM | POA: Diagnosis not present

## 2024-04-26 DIAGNOSIS — I10 Essential (primary) hypertension: Secondary | ICD-10-CM | POA: Diagnosis not present

## 2024-04-26 DIAGNOSIS — F339 Major depressive disorder, recurrent, unspecified: Secondary | ICD-10-CM | POA: Diagnosis not present

## 2024-04-26 DIAGNOSIS — N2581 Secondary hyperparathyroidism of renal origin: Secondary | ICD-10-CM | POA: Diagnosis not present

## 2024-04-26 DIAGNOSIS — D508 Other iron deficiency anemias: Secondary | ICD-10-CM | POA: Diagnosis not present

## 2024-04-26 DIAGNOSIS — N186 End stage renal disease: Secondary | ICD-10-CM | POA: Diagnosis not present

## 2024-04-26 DIAGNOSIS — Z992 Dependence on renal dialysis: Secondary | ICD-10-CM | POA: Diagnosis not present

## 2024-04-26 DIAGNOSIS — D631 Anemia in chronic kidney disease: Secondary | ICD-10-CM | POA: Diagnosis not present

## 2024-04-26 DIAGNOSIS — E1122 Type 2 diabetes mellitus with diabetic chronic kidney disease: Secondary | ICD-10-CM | POA: Diagnosis not present

## 2024-04-28 DIAGNOSIS — Z992 Dependence on renal dialysis: Secondary | ICD-10-CM | POA: Diagnosis not present

## 2024-04-28 DIAGNOSIS — I509 Heart failure, unspecified: Secondary | ICD-10-CM | POA: Diagnosis not present

## 2024-04-28 DIAGNOSIS — Z7409 Other reduced mobility: Secondary | ICD-10-CM | POA: Diagnosis not present

## 2024-04-28 DIAGNOSIS — R197 Diarrhea, unspecified: Secondary | ICD-10-CM | POA: Diagnosis not present

## 2024-04-28 DIAGNOSIS — D631 Anemia in chronic kidney disease: Secondary | ICD-10-CM | POA: Diagnosis not present

## 2024-04-28 DIAGNOSIS — N2581 Secondary hyperparathyroidism of renal origin: Secondary | ICD-10-CM | POA: Diagnosis not present

## 2024-04-28 DIAGNOSIS — N186 End stage renal disease: Secondary | ICD-10-CM | POA: Diagnosis not present

## 2024-04-28 DIAGNOSIS — D508 Other iron deficiency anemias: Secondary | ICD-10-CM | POA: Diagnosis not present

## 2024-04-28 DIAGNOSIS — E1122 Type 2 diabetes mellitus with diabetic chronic kidney disease: Secondary | ICD-10-CM | POA: Diagnosis not present

## 2024-04-28 DIAGNOSIS — R531 Weakness: Secondary | ICD-10-CM | POA: Diagnosis not present

## 2024-04-28 DIAGNOSIS — Z9981 Dependence on supplemental oxygen: Secondary | ICD-10-CM | POA: Diagnosis not present

## 2024-04-28 DIAGNOSIS — J9610 Chronic respiratory failure, unspecified whether with hypoxia or hypercapnia: Secondary | ICD-10-CM | POA: Diagnosis not present

## 2024-04-30 DIAGNOSIS — Z9981 Dependence on supplemental oxygen: Secondary | ICD-10-CM | POA: Diagnosis not present

## 2024-04-30 DIAGNOSIS — J9610 Chronic respiratory failure, unspecified whether with hypoxia or hypercapnia: Secondary | ICD-10-CM | POA: Diagnosis not present

## 2024-04-30 DIAGNOSIS — R531 Weakness: Secondary | ICD-10-CM | POA: Diagnosis not present

## 2024-04-30 DIAGNOSIS — I509 Heart failure, unspecified: Secondary | ICD-10-CM | POA: Diagnosis not present

## 2024-04-30 DIAGNOSIS — Z992 Dependence on renal dialysis: Secondary | ICD-10-CM | POA: Diagnosis not present

## 2024-04-30 DIAGNOSIS — N186 End stage renal disease: Secondary | ICD-10-CM | POA: Diagnosis not present

## 2024-04-30 DIAGNOSIS — Z7409 Other reduced mobility: Secondary | ICD-10-CM | POA: Diagnosis not present

## 2024-05-01 DIAGNOSIS — F419 Anxiety disorder, unspecified: Secondary | ICD-10-CM | POA: Diagnosis not present

## 2024-05-01 DIAGNOSIS — R531 Weakness: Secondary | ICD-10-CM | POA: Diagnosis not present

## 2024-05-01 DIAGNOSIS — Z9981 Dependence on supplemental oxygen: Secondary | ICD-10-CM | POA: Diagnosis not present

## 2024-05-01 DIAGNOSIS — Z7189 Other specified counseling: Secondary | ICD-10-CM | POA: Diagnosis not present

## 2024-05-01 DIAGNOSIS — Z992 Dependence on renal dialysis: Secondary | ICD-10-CM | POA: Diagnosis not present

## 2024-05-01 DIAGNOSIS — J9610 Chronic respiratory failure, unspecified whether with hypoxia or hypercapnia: Secondary | ICD-10-CM | POA: Diagnosis not present

## 2024-05-01 DIAGNOSIS — N186 End stage renal disease: Secondary | ICD-10-CM | POA: Diagnosis not present

## 2024-05-01 DIAGNOSIS — Z7409 Other reduced mobility: Secondary | ICD-10-CM | POA: Diagnosis not present

## 2024-05-02 DIAGNOSIS — N186 End stage renal disease: Secondary | ICD-10-CM | POA: Diagnosis not present

## 2024-05-02 DIAGNOSIS — R110 Nausea: Secondary | ICD-10-CM | POA: Diagnosis not present

## 2024-05-02 DIAGNOSIS — F419 Anxiety disorder, unspecified: Secondary | ICD-10-CM | POA: Diagnosis not present

## 2024-05-02 DIAGNOSIS — R197 Diarrhea, unspecified: Secondary | ICD-10-CM | POA: Diagnosis not present

## 2024-05-02 DIAGNOSIS — Z7409 Other reduced mobility: Secondary | ICD-10-CM | POA: Diagnosis not present

## 2024-05-02 DIAGNOSIS — Z992 Dependence on renal dialysis: Secondary | ICD-10-CM | POA: Diagnosis not present

## 2024-05-02 DIAGNOSIS — R531 Weakness: Secondary | ICD-10-CM | POA: Diagnosis not present

## 2024-05-03 DIAGNOSIS — N2581 Secondary hyperparathyroidism of renal origin: Secondary | ICD-10-CM | POA: Diagnosis not present

## 2024-05-03 DIAGNOSIS — N186 End stage renal disease: Secondary | ICD-10-CM | POA: Diagnosis not present

## 2024-05-03 DIAGNOSIS — R531 Weakness: Secondary | ICD-10-CM | POA: Diagnosis not present

## 2024-05-03 DIAGNOSIS — D508 Other iron deficiency anemias: Secondary | ICD-10-CM | POA: Diagnosis not present

## 2024-05-03 DIAGNOSIS — D631 Anemia in chronic kidney disease: Secondary | ICD-10-CM | POA: Diagnosis not present

## 2024-05-03 DIAGNOSIS — E1122 Type 2 diabetes mellitus with diabetic chronic kidney disease: Secondary | ICD-10-CM | POA: Diagnosis not present

## 2024-05-03 DIAGNOSIS — Z992 Dependence on renal dialysis: Secondary | ICD-10-CM | POA: Diagnosis not present

## 2024-05-03 DIAGNOSIS — R197 Diarrhea, unspecified: Secondary | ICD-10-CM | POA: Diagnosis not present

## 2024-05-03 DIAGNOSIS — Z7409 Other reduced mobility: Secondary | ICD-10-CM | POA: Diagnosis not present

## 2024-05-04 DIAGNOSIS — R2689 Other abnormalities of gait and mobility: Secondary | ICD-10-CM | POA: Diagnosis not present

## 2024-05-04 DIAGNOSIS — J9610 Chronic respiratory failure, unspecified whether with hypoxia or hypercapnia: Secondary | ICD-10-CM | POA: Diagnosis not present

## 2024-05-04 DIAGNOSIS — D649 Anemia, unspecified: Secondary | ICD-10-CM | POA: Diagnosis not present

## 2024-05-04 DIAGNOSIS — Z9981 Dependence on supplemental oxygen: Secondary | ICD-10-CM | POA: Diagnosis not present

## 2024-05-04 DIAGNOSIS — J9611 Chronic respiratory failure with hypoxia: Secondary | ICD-10-CM | POA: Diagnosis not present

## 2024-05-04 DIAGNOSIS — F4323 Adjustment disorder with mixed anxiety and depressed mood: Secondary | ICD-10-CM | POA: Diagnosis not present

## 2024-05-04 DIAGNOSIS — N186 End stage renal disease: Secondary | ICD-10-CM | POA: Diagnosis not present

## 2024-05-04 DIAGNOSIS — I10 Essential (primary) hypertension: Secondary | ICD-10-CM | POA: Diagnosis not present

## 2024-05-05 DIAGNOSIS — N2581 Secondary hyperparathyroidism of renal origin: Secondary | ICD-10-CM | POA: Diagnosis not present

## 2024-05-05 DIAGNOSIS — D631 Anemia in chronic kidney disease: Secondary | ICD-10-CM | POA: Diagnosis not present

## 2024-05-05 DIAGNOSIS — N186 End stage renal disease: Secondary | ICD-10-CM | POA: Diagnosis not present

## 2024-05-05 DIAGNOSIS — E1122 Type 2 diabetes mellitus with diabetic chronic kidney disease: Secondary | ICD-10-CM | POA: Diagnosis not present

## 2024-05-05 DIAGNOSIS — Z992 Dependence on renal dialysis: Secondary | ICD-10-CM | POA: Diagnosis not present

## 2024-05-05 DIAGNOSIS — D508 Other iron deficiency anemias: Secondary | ICD-10-CM | POA: Diagnosis not present

## 2024-05-07 DIAGNOSIS — Z992 Dependence on renal dialysis: Secondary | ICD-10-CM | POA: Diagnosis not present

## 2024-05-07 DIAGNOSIS — N186 End stage renal disease: Secondary | ICD-10-CM | POA: Diagnosis not present

## 2024-05-07 DIAGNOSIS — Z7409 Other reduced mobility: Secondary | ICD-10-CM | POA: Diagnosis not present

## 2024-05-07 DIAGNOSIS — F419 Anxiety disorder, unspecified: Secondary | ICD-10-CM | POA: Diagnosis not present

## 2024-05-07 DIAGNOSIS — R531 Weakness: Secondary | ICD-10-CM | POA: Diagnosis not present

## 2024-05-08 DIAGNOSIS — R531 Weakness: Secondary | ICD-10-CM | POA: Diagnosis not present

## 2024-05-08 DIAGNOSIS — F4323 Adjustment disorder with mixed anxiety and depressed mood: Secondary | ICD-10-CM | POA: Diagnosis not present

## 2024-05-08 DIAGNOSIS — Z9981 Dependence on supplemental oxygen: Secondary | ICD-10-CM | POA: Diagnosis not present

## 2024-05-08 DIAGNOSIS — D638 Anemia in other chronic diseases classified elsewhere: Secondary | ICD-10-CM | POA: Diagnosis not present

## 2024-05-08 DIAGNOSIS — I509 Heart failure, unspecified: Secondary | ICD-10-CM | POA: Diagnosis not present

## 2024-05-08 DIAGNOSIS — N186 End stage renal disease: Secondary | ICD-10-CM | POA: Diagnosis not present

## 2024-05-08 DIAGNOSIS — J9610 Chronic respiratory failure, unspecified whether with hypoxia or hypercapnia: Secondary | ICD-10-CM | POA: Diagnosis not present

## 2024-05-09 DIAGNOSIS — E1122 Type 2 diabetes mellitus with diabetic chronic kidney disease: Secondary | ICD-10-CM | POA: Diagnosis not present

## 2024-05-09 DIAGNOSIS — F3341 Major depressive disorder, recurrent, in partial remission: Secondary | ICD-10-CM | POA: Diagnosis not present

## 2024-05-09 DIAGNOSIS — E7800 Pure hypercholesterolemia, unspecified: Secondary | ICD-10-CM | POA: Diagnosis not present

## 2024-05-09 DIAGNOSIS — N186 End stage renal disease: Secondary | ICD-10-CM | POA: Diagnosis not present

## 2024-05-09 DIAGNOSIS — R4189 Other symptoms and signs involving cognitive functions and awareness: Secondary | ICD-10-CM | POA: Diagnosis not present

## 2024-05-09 DIAGNOSIS — I10 Essential (primary) hypertension: Secondary | ICD-10-CM | POA: Diagnosis not present

## 2024-05-09 DIAGNOSIS — I2510 Atherosclerotic heart disease of native coronary artery without angina pectoris: Secondary | ICD-10-CM | POA: Diagnosis not present

## 2024-05-09 DIAGNOSIS — E039 Hypothyroidism, unspecified: Secondary | ICD-10-CM | POA: Diagnosis not present

## 2024-05-09 DIAGNOSIS — I5030 Unspecified diastolic (congestive) heart failure: Secondary | ICD-10-CM | POA: Diagnosis not present

## 2024-05-09 DIAGNOSIS — D649 Anemia, unspecified: Secondary | ICD-10-CM | POA: Diagnosis not present

## 2024-05-09 DIAGNOSIS — E210 Primary hyperparathyroidism: Secondary | ICD-10-CM | POA: Diagnosis not present

## 2024-05-10 DIAGNOSIS — F419 Anxiety disorder, unspecified: Secondary | ICD-10-CM | POA: Diagnosis not present

## 2024-05-10 DIAGNOSIS — I509 Heart failure, unspecified: Secondary | ICD-10-CM | POA: Diagnosis not present

## 2024-05-10 DIAGNOSIS — J9610 Chronic respiratory failure, unspecified whether with hypoxia or hypercapnia: Secondary | ICD-10-CM | POA: Diagnosis not present

## 2024-05-10 DIAGNOSIS — Z9981 Dependence on supplemental oxygen: Secondary | ICD-10-CM | POA: Diagnosis not present

## 2024-05-10 DIAGNOSIS — J189 Pneumonia, unspecified organism: Secondary | ICD-10-CM | POA: Diagnosis not present

## 2024-05-10 DIAGNOSIS — R531 Weakness: Secondary | ICD-10-CM | POA: Diagnosis not present

## 2024-05-10 DIAGNOSIS — Z992 Dependence on renal dialysis: Secondary | ICD-10-CM | POA: Diagnosis not present

## 2024-05-10 DIAGNOSIS — D631 Anemia in chronic kidney disease: Secondary | ICD-10-CM | POA: Diagnosis not present

## 2024-05-10 DIAGNOSIS — D508 Other iron deficiency anemias: Secondary | ICD-10-CM | POA: Diagnosis not present

## 2024-05-10 DIAGNOSIS — G8929 Other chronic pain: Secondary | ICD-10-CM | POA: Diagnosis not present

## 2024-05-10 DIAGNOSIS — N2581 Secondary hyperparathyroidism of renal origin: Secondary | ICD-10-CM | POA: Diagnosis not present

## 2024-05-10 DIAGNOSIS — N186 End stage renal disease: Secondary | ICD-10-CM | POA: Diagnosis not present

## 2024-05-10 DIAGNOSIS — E1122 Type 2 diabetes mellitus with diabetic chronic kidney disease: Secondary | ICD-10-CM | POA: Diagnosis not present

## 2024-05-12 DIAGNOSIS — E1122 Type 2 diabetes mellitus with diabetic chronic kidney disease: Secondary | ICD-10-CM | POA: Diagnosis not present

## 2024-05-12 DIAGNOSIS — N2581 Secondary hyperparathyroidism of renal origin: Secondary | ICD-10-CM | POA: Diagnosis not present

## 2024-05-12 DIAGNOSIS — D631 Anemia in chronic kidney disease: Secondary | ICD-10-CM | POA: Diagnosis not present

## 2024-05-12 DIAGNOSIS — D508 Other iron deficiency anemias: Secondary | ICD-10-CM | POA: Diagnosis not present

## 2024-05-12 DIAGNOSIS — Z992 Dependence on renal dialysis: Secondary | ICD-10-CM | POA: Diagnosis not present

## 2024-05-12 DIAGNOSIS — N186 End stage renal disease: Secondary | ICD-10-CM | POA: Diagnosis not present

## 2024-05-13 DIAGNOSIS — E785 Hyperlipidemia, unspecified: Secondary | ICD-10-CM | POA: Diagnosis not present

## 2024-05-13 DIAGNOSIS — E039 Hypothyroidism, unspecified: Secondary | ICD-10-CM | POA: Diagnosis not present

## 2024-05-13 DIAGNOSIS — I1310 Hypertensive heart and chronic kidney disease without heart failure, with stage 1 through stage 4 chronic kidney disease, or unspecified chronic kidney disease: Secondary | ICD-10-CM | POA: Diagnosis not present

## 2024-05-13 DIAGNOSIS — F4323 Adjustment disorder with mixed anxiety and depressed mood: Secondary | ICD-10-CM | POA: Diagnosis not present

## 2024-05-13 DIAGNOSIS — N186 End stage renal disease: Secondary | ICD-10-CM | POA: Diagnosis not present

## 2024-05-13 DIAGNOSIS — R000 Tachycardia, unspecified: Secondary | ICD-10-CM | POA: Diagnosis not present

## 2024-05-13 DIAGNOSIS — J189 Pneumonia, unspecified organism: Secondary | ICD-10-CM | POA: Diagnosis not present

## 2024-05-14 DIAGNOSIS — Z9981 Dependence on supplemental oxygen: Secondary | ICD-10-CM | POA: Diagnosis not present

## 2024-05-14 DIAGNOSIS — F4323 Adjustment disorder with mixed anxiety and depressed mood: Secondary | ICD-10-CM | POA: Diagnosis not present

## 2024-05-14 DIAGNOSIS — Z992 Dependence on renal dialysis: Secondary | ICD-10-CM | POA: Diagnosis not present

## 2024-05-14 DIAGNOSIS — N186 End stage renal disease: Secondary | ICD-10-CM | POA: Diagnosis not present

## 2024-05-14 DIAGNOSIS — F419 Anxiety disorder, unspecified: Secondary | ICD-10-CM | POA: Diagnosis not present

## 2024-05-14 DIAGNOSIS — Z7409 Other reduced mobility: Secondary | ICD-10-CM | POA: Diagnosis not present

## 2024-05-17 DIAGNOSIS — N186 End stage renal disease: Secondary | ICD-10-CM | POA: Diagnosis not present

## 2024-05-17 DIAGNOSIS — Z992 Dependence on renal dialysis: Secondary | ICD-10-CM | POA: Diagnosis not present

## 2024-05-17 DIAGNOSIS — D508 Other iron deficiency anemias: Secondary | ICD-10-CM | POA: Diagnosis not present

## 2024-05-17 DIAGNOSIS — Z9981 Dependence on supplemental oxygen: Secondary | ICD-10-CM | POA: Diagnosis not present

## 2024-05-17 DIAGNOSIS — J189 Pneumonia, unspecified organism: Secondary | ICD-10-CM | POA: Diagnosis not present

## 2024-05-17 DIAGNOSIS — Z7409 Other reduced mobility: Secondary | ICD-10-CM | POA: Diagnosis not present

## 2024-05-17 DIAGNOSIS — E1122 Type 2 diabetes mellitus with diabetic chronic kidney disease: Secondary | ICD-10-CM | POA: Diagnosis not present

## 2024-05-17 DIAGNOSIS — N2581 Secondary hyperparathyroidism of renal origin: Secondary | ICD-10-CM | POA: Diagnosis not present

## 2024-05-17 DIAGNOSIS — D631 Anemia in chronic kidney disease: Secondary | ICD-10-CM | POA: Diagnosis not present

## 2024-05-19 DIAGNOSIS — J9611 Chronic respiratory failure with hypoxia: Secondary | ICD-10-CM | POA: Diagnosis not present

## 2024-05-19 DIAGNOSIS — I10 Essential (primary) hypertension: Secondary | ICD-10-CM | POA: Diagnosis not present

## 2024-05-19 DIAGNOSIS — Z992 Dependence on renal dialysis: Secondary | ICD-10-CM | POA: Diagnosis not present

## 2024-05-19 DIAGNOSIS — R2689 Other abnormalities of gait and mobility: Secondary | ICD-10-CM | POA: Diagnosis not present

## 2024-05-19 DIAGNOSIS — D631 Anemia in chronic kidney disease: Secondary | ICD-10-CM | POA: Diagnosis not present

## 2024-05-19 DIAGNOSIS — N2581 Secondary hyperparathyroidism of renal origin: Secondary | ICD-10-CM | POA: Diagnosis not present

## 2024-05-19 DIAGNOSIS — D508 Other iron deficiency anemias: Secondary | ICD-10-CM | POA: Diagnosis not present

## 2024-05-19 DIAGNOSIS — N186 End stage renal disease: Secondary | ICD-10-CM | POA: Diagnosis not present

## 2024-05-19 DIAGNOSIS — E1122 Type 2 diabetes mellitus with diabetic chronic kidney disease: Secondary | ICD-10-CM | POA: Diagnosis not present

## 2024-05-22 DIAGNOSIS — Z992 Dependence on renal dialysis: Secondary | ICD-10-CM | POA: Diagnosis not present

## 2024-05-22 DIAGNOSIS — E1122 Type 2 diabetes mellitus with diabetic chronic kidney disease: Secondary | ICD-10-CM | POA: Diagnosis not present

## 2024-05-22 DIAGNOSIS — N186 End stage renal disease: Secondary | ICD-10-CM | POA: Diagnosis not present

## 2024-05-24 DIAGNOSIS — R531 Weakness: Secondary | ICD-10-CM | POA: Diagnosis not present

## 2024-05-24 DIAGNOSIS — Z9981 Dependence on supplemental oxygen: Secondary | ICD-10-CM | POA: Diagnosis not present

## 2024-05-24 DIAGNOSIS — Z992 Dependence on renal dialysis: Secondary | ICD-10-CM | POA: Diagnosis not present

## 2024-05-24 DIAGNOSIS — N6019 Diffuse cystic mastopathy of unspecified breast: Secondary | ICD-10-CM | POA: Diagnosis not present

## 2024-05-24 DIAGNOSIS — I509 Heart failure, unspecified: Secondary | ICD-10-CM | POA: Diagnosis not present

## 2024-05-24 DIAGNOSIS — D508 Other iron deficiency anemias: Secondary | ICD-10-CM | POA: Diagnosis not present

## 2024-05-24 DIAGNOSIS — F419 Anxiety disorder, unspecified: Secondary | ICD-10-CM | POA: Diagnosis not present

## 2024-05-24 DIAGNOSIS — Z7409 Other reduced mobility: Secondary | ICD-10-CM | POA: Diagnosis not present

## 2024-05-24 DIAGNOSIS — D631 Anemia in chronic kidney disease: Secondary | ICD-10-CM | POA: Diagnosis not present

## 2024-05-24 DIAGNOSIS — N2581 Secondary hyperparathyroidism of renal origin: Secondary | ICD-10-CM | POA: Diagnosis not present

## 2024-05-24 DIAGNOSIS — N186 End stage renal disease: Secondary | ICD-10-CM | POA: Diagnosis not present

## 2024-05-26 DIAGNOSIS — N186 End stage renal disease: Secondary | ICD-10-CM | POA: Diagnosis not present

## 2024-05-26 DIAGNOSIS — D508 Other iron deficiency anemias: Secondary | ICD-10-CM | POA: Diagnosis not present

## 2024-05-26 DIAGNOSIS — D631 Anemia in chronic kidney disease: Secondary | ICD-10-CM | POA: Diagnosis not present

## 2024-05-26 DIAGNOSIS — Z992 Dependence on renal dialysis: Secondary | ICD-10-CM | POA: Diagnosis not present

## 2024-05-26 DIAGNOSIS — N2581 Secondary hyperparathyroidism of renal origin: Secondary | ICD-10-CM | POA: Diagnosis not present

## 2024-05-28 DIAGNOSIS — R531 Weakness: Secondary | ICD-10-CM | POA: Diagnosis not present

## 2024-05-28 DIAGNOSIS — R609 Edema, unspecified: Secondary | ICD-10-CM | POA: Diagnosis not present

## 2024-05-28 DIAGNOSIS — F419 Anxiety disorder, unspecified: Secondary | ICD-10-CM | POA: Diagnosis not present

## 2024-05-28 DIAGNOSIS — J9610 Chronic respiratory failure, unspecified whether with hypoxia or hypercapnia: Secondary | ICD-10-CM | POA: Diagnosis not present

## 2024-05-28 DIAGNOSIS — N186 End stage renal disease: Secondary | ICD-10-CM | POA: Diagnosis not present

## 2024-05-28 DIAGNOSIS — Z91199 Patient's noncompliance with other medical treatment and regimen due to unspecified reason: Secondary | ICD-10-CM | POA: Diagnosis not present

## 2024-05-28 DIAGNOSIS — Z992 Dependence on renal dialysis: Secondary | ICD-10-CM | POA: Diagnosis not present

## 2024-05-28 DIAGNOSIS — Z9981 Dependence on supplemental oxygen: Secondary | ICD-10-CM | POA: Diagnosis not present

## 2024-05-28 DIAGNOSIS — Z7409 Other reduced mobility: Secondary | ICD-10-CM | POA: Diagnosis not present

## 2024-05-28 DIAGNOSIS — K5900 Constipation, unspecified: Secondary | ICD-10-CM | POA: Diagnosis not present

## 2024-05-30 DIAGNOSIS — Z9981 Dependence on supplemental oxygen: Secondary | ICD-10-CM | POA: Diagnosis not present

## 2024-05-30 DIAGNOSIS — Z91199 Patient's noncompliance with other medical treatment and regimen due to unspecified reason: Secondary | ICD-10-CM | POA: Diagnosis not present

## 2024-05-30 DIAGNOSIS — Z992 Dependence on renal dialysis: Secondary | ICD-10-CM | POA: Diagnosis not present

## 2024-05-30 DIAGNOSIS — J9610 Chronic respiratory failure, unspecified whether with hypoxia or hypercapnia: Secondary | ICD-10-CM | POA: Diagnosis not present

## 2024-05-30 DIAGNOSIS — N186 End stage renal disease: Secondary | ICD-10-CM | POA: Diagnosis not present

## 2024-05-30 DIAGNOSIS — R531 Weakness: Secondary | ICD-10-CM | POA: Diagnosis not present

## 2024-05-30 DIAGNOSIS — Z7409 Other reduced mobility: Secondary | ICD-10-CM | POA: Diagnosis not present

## 2024-05-30 DIAGNOSIS — F419 Anxiety disorder, unspecified: Secondary | ICD-10-CM | POA: Diagnosis not present

## 2024-05-31 DIAGNOSIS — N2581 Secondary hyperparathyroidism of renal origin: Secondary | ICD-10-CM | POA: Diagnosis not present

## 2024-05-31 DIAGNOSIS — D508 Other iron deficiency anemias: Secondary | ICD-10-CM | POA: Diagnosis not present

## 2024-05-31 DIAGNOSIS — N186 End stage renal disease: Secondary | ICD-10-CM | POA: Diagnosis not present

## 2024-05-31 DIAGNOSIS — D631 Anemia in chronic kidney disease: Secondary | ICD-10-CM | POA: Diagnosis not present

## 2024-05-31 DIAGNOSIS — Z992 Dependence on renal dialysis: Secondary | ICD-10-CM | POA: Diagnosis not present

## 2024-06-01 ENCOUNTER — Ambulatory Visit: Admitting: Cardiology

## 2024-06-01 DIAGNOSIS — Z7409 Other reduced mobility: Secondary | ICD-10-CM | POA: Diagnosis not present

## 2024-06-01 DIAGNOSIS — R531 Weakness: Secondary | ICD-10-CM | POA: Diagnosis not present

## 2024-06-01 DIAGNOSIS — W19XXXA Unspecified fall, initial encounter: Secondary | ICD-10-CM | POA: Diagnosis not present

## 2024-06-01 DIAGNOSIS — M79671 Pain in right foot: Secondary | ICD-10-CM | POA: Diagnosis not present

## 2024-06-02 DIAGNOSIS — Z992 Dependence on renal dialysis: Secondary | ICD-10-CM | POA: Diagnosis not present

## 2024-06-02 DIAGNOSIS — N186 End stage renal disease: Secondary | ICD-10-CM | POA: Diagnosis not present

## 2024-06-02 DIAGNOSIS — D508 Other iron deficiency anemias: Secondary | ICD-10-CM | POA: Diagnosis not present

## 2024-06-02 DIAGNOSIS — D631 Anemia in chronic kidney disease: Secondary | ICD-10-CM | POA: Diagnosis not present

## 2024-06-02 DIAGNOSIS — N2581 Secondary hyperparathyroidism of renal origin: Secondary | ICD-10-CM | POA: Diagnosis not present

## 2024-06-03 DIAGNOSIS — Z7409 Other reduced mobility: Secondary | ICD-10-CM | POA: Diagnosis not present

## 2024-06-03 DIAGNOSIS — M79671 Pain in right foot: Secondary | ICD-10-CM | POA: Diagnosis not present

## 2024-06-03 DIAGNOSIS — R531 Weakness: Secondary | ICD-10-CM | POA: Diagnosis not present

## 2024-06-04 DIAGNOSIS — R531 Weakness: Secondary | ICD-10-CM | POA: Diagnosis not present

## 2024-06-04 DIAGNOSIS — M79604 Pain in right leg: Secondary | ICD-10-CM | POA: Diagnosis not present

## 2024-06-04 DIAGNOSIS — M79671 Pain in right foot: Secondary | ICD-10-CM | POA: Diagnosis not present

## 2024-06-04 DIAGNOSIS — Z7409 Other reduced mobility: Secondary | ICD-10-CM | POA: Diagnosis not present

## 2024-06-04 DIAGNOSIS — M7989 Other specified soft tissue disorders: Secondary | ICD-10-CM | POA: Diagnosis not present

## 2024-06-04 DIAGNOSIS — Z9181 History of falling: Secondary | ICD-10-CM | POA: Diagnosis not present

## 2024-06-04 DIAGNOSIS — N186 End stage renal disease: Secondary | ICD-10-CM | POA: Diagnosis not present

## 2024-06-04 DIAGNOSIS — Z992 Dependence on renal dialysis: Secondary | ICD-10-CM | POA: Diagnosis not present

## 2024-06-04 DIAGNOSIS — Z9981 Dependence on supplemental oxygen: Secondary | ICD-10-CM | POA: Diagnosis not present

## 2024-06-06 DIAGNOSIS — Z7409 Other reduced mobility: Secondary | ICD-10-CM | POA: Diagnosis not present

## 2024-06-06 DIAGNOSIS — N186 End stage renal disease: Secondary | ICD-10-CM | POA: Diagnosis not present

## 2024-06-06 DIAGNOSIS — Z79899 Other long term (current) drug therapy: Secondary | ICD-10-CM | POA: Diagnosis not present

## 2024-06-06 DIAGNOSIS — M79604 Pain in right leg: Secondary | ICD-10-CM | POA: Diagnosis not present

## 2024-06-06 DIAGNOSIS — Z992 Dependence on renal dialysis: Secondary | ICD-10-CM | POA: Diagnosis not present

## 2024-06-06 DIAGNOSIS — R531 Weakness: Secondary | ICD-10-CM | POA: Diagnosis not present

## 2024-06-07 DIAGNOSIS — D631 Anemia in chronic kidney disease: Secondary | ICD-10-CM | POA: Diagnosis not present

## 2024-06-07 DIAGNOSIS — N2581 Secondary hyperparathyroidism of renal origin: Secondary | ICD-10-CM | POA: Diagnosis not present

## 2024-06-07 DIAGNOSIS — N186 End stage renal disease: Secondary | ICD-10-CM | POA: Diagnosis not present

## 2024-06-07 DIAGNOSIS — D508 Other iron deficiency anemias: Secondary | ICD-10-CM | POA: Diagnosis not present

## 2024-06-07 DIAGNOSIS — Z992 Dependence on renal dialysis: Secondary | ICD-10-CM | POA: Diagnosis not present

## 2024-06-08 ENCOUNTER — Other Ambulatory Visit: Payer: Self-pay

## 2024-06-08 DIAGNOSIS — N644 Mastodynia: Secondary | ICD-10-CM

## 2024-06-08 DIAGNOSIS — R531 Weakness: Secondary | ICD-10-CM | POA: Diagnosis not present

## 2024-06-08 DIAGNOSIS — R609 Edema, unspecified: Secondary | ICD-10-CM | POA: Diagnosis not present

## 2024-06-08 DIAGNOSIS — I5030 Unspecified diastolic (congestive) heart failure: Secondary | ICD-10-CM | POA: Diagnosis not present

## 2024-06-08 DIAGNOSIS — E1122 Type 2 diabetes mellitus with diabetic chronic kidney disease: Secondary | ICD-10-CM | POA: Diagnosis not present

## 2024-06-08 DIAGNOSIS — I10 Essential (primary) hypertension: Secondary | ICD-10-CM | POA: Diagnosis not present

## 2024-06-08 DIAGNOSIS — K5900 Constipation, unspecified: Secondary | ICD-10-CM | POA: Diagnosis not present

## 2024-06-08 DIAGNOSIS — I2510 Atherosclerotic heart disease of native coronary artery without angina pectoris: Secondary | ICD-10-CM | POA: Diagnosis not present

## 2024-06-08 DIAGNOSIS — Z9981 Dependence on supplemental oxygen: Secondary | ICD-10-CM | POA: Diagnosis not present

## 2024-06-08 DIAGNOSIS — E7800 Pure hypercholesterolemia, unspecified: Secondary | ICD-10-CM | POA: Diagnosis not present

## 2024-06-08 DIAGNOSIS — F3341 Major depressive disorder, recurrent, in partial remission: Secondary | ICD-10-CM | POA: Diagnosis not present

## 2024-06-08 DIAGNOSIS — N186 End stage renal disease: Secondary | ICD-10-CM | POA: Diagnosis not present

## 2024-06-08 DIAGNOSIS — R110 Nausea: Secondary | ICD-10-CM | POA: Diagnosis not present

## 2024-06-08 DIAGNOSIS — Z992 Dependence on renal dialysis: Secondary | ICD-10-CM | POA: Diagnosis not present

## 2024-06-08 DIAGNOSIS — R4189 Other symptoms and signs involving cognitive functions and awareness: Secondary | ICD-10-CM | POA: Diagnosis not present

## 2024-06-08 DIAGNOSIS — E210 Primary hyperparathyroidism: Secondary | ICD-10-CM | POA: Diagnosis not present

## 2024-06-08 DIAGNOSIS — Z7409 Other reduced mobility: Secondary | ICD-10-CM | POA: Diagnosis not present

## 2024-06-08 DIAGNOSIS — E039 Hypothyroidism, unspecified: Secondary | ICD-10-CM | POA: Diagnosis not present

## 2024-06-09 ENCOUNTER — Other Ambulatory Visit: Payer: Self-pay | Admitting: Internal Medicine

## 2024-06-09 DIAGNOSIS — G8929 Other chronic pain: Secondary | ICD-10-CM | POA: Diagnosis not present

## 2024-06-09 DIAGNOSIS — D508 Other iron deficiency anemias: Secondary | ICD-10-CM | POA: Diagnosis not present

## 2024-06-09 DIAGNOSIS — F411 Generalized anxiety disorder: Secondary | ICD-10-CM | POA: Diagnosis not present

## 2024-06-09 DIAGNOSIS — F331 Major depressive disorder, recurrent, moderate: Secondary | ICD-10-CM | POA: Diagnosis not present

## 2024-06-09 DIAGNOSIS — N2581 Secondary hyperparathyroidism of renal origin: Secondary | ICD-10-CM | POA: Diagnosis not present

## 2024-06-09 DIAGNOSIS — F5105 Insomnia due to other mental disorder: Secondary | ICD-10-CM | POA: Diagnosis not present

## 2024-06-09 DIAGNOSIS — Q839 Congenital malformation of breast, unspecified: Secondary | ICD-10-CM

## 2024-06-09 DIAGNOSIS — D631 Anemia in chronic kidney disease: Secondary | ICD-10-CM | POA: Diagnosis not present

## 2024-06-09 DIAGNOSIS — Z992 Dependence on renal dialysis: Secondary | ICD-10-CM | POA: Diagnosis not present

## 2024-06-09 DIAGNOSIS — N186 End stage renal disease: Secondary | ICD-10-CM | POA: Diagnosis not present

## 2024-06-13 DIAGNOSIS — L03115 Cellulitis of right lower limb: Secondary | ICD-10-CM | POA: Diagnosis not present

## 2024-06-13 DIAGNOSIS — M79604 Pain in right leg: Secondary | ICD-10-CM | POA: Diagnosis not present

## 2024-06-13 DIAGNOSIS — R531 Weakness: Secondary | ICD-10-CM | POA: Diagnosis not present

## 2024-06-13 DIAGNOSIS — N186 End stage renal disease: Secondary | ICD-10-CM | POA: Diagnosis not present

## 2024-06-13 DIAGNOSIS — Z7409 Other reduced mobility: Secondary | ICD-10-CM | POA: Diagnosis not present

## 2024-06-13 DIAGNOSIS — Z992 Dependence on renal dialysis: Secondary | ICD-10-CM | POA: Diagnosis not present

## 2024-06-13 DIAGNOSIS — R609 Edema, unspecified: Secondary | ICD-10-CM | POA: Diagnosis not present

## 2024-06-14 DIAGNOSIS — Z7409 Other reduced mobility: Secondary | ICD-10-CM | POA: Diagnosis not present

## 2024-06-14 DIAGNOSIS — R197 Diarrhea, unspecified: Secondary | ICD-10-CM | POA: Diagnosis not present

## 2024-06-14 DIAGNOSIS — L03115 Cellulitis of right lower limb: Secondary | ICD-10-CM | POA: Diagnosis not present

## 2024-06-14 DIAGNOSIS — Z992 Dependence on renal dialysis: Secondary | ICD-10-CM | POA: Diagnosis not present

## 2024-06-14 DIAGNOSIS — N186 End stage renal disease: Secondary | ICD-10-CM | POA: Diagnosis not present

## 2024-06-14 DIAGNOSIS — R531 Weakness: Secondary | ICD-10-CM | POA: Diagnosis not present

## 2024-06-15 DIAGNOSIS — L03115 Cellulitis of right lower limb: Secondary | ICD-10-CM | POA: Diagnosis not present

## 2024-06-15 DIAGNOSIS — R197 Diarrhea, unspecified: Secondary | ICD-10-CM | POA: Diagnosis not present

## 2024-06-15 DIAGNOSIS — N186 End stage renal disease: Secondary | ICD-10-CM | POA: Diagnosis not present

## 2024-06-15 DIAGNOSIS — Z7409 Other reduced mobility: Secondary | ICD-10-CM | POA: Diagnosis not present

## 2024-06-15 DIAGNOSIS — R531 Weakness: Secondary | ICD-10-CM | POA: Diagnosis not present

## 2024-06-15 DIAGNOSIS — Z992 Dependence on renal dialysis: Secondary | ICD-10-CM | POA: Diagnosis not present

## 2024-06-16 DIAGNOSIS — N186 End stage renal disease: Secondary | ICD-10-CM | POA: Diagnosis not present

## 2024-06-16 DIAGNOSIS — Z992 Dependence on renal dialysis: Secondary | ICD-10-CM | POA: Diagnosis not present

## 2024-06-16 DIAGNOSIS — J9611 Chronic respiratory failure with hypoxia: Secondary | ICD-10-CM | POA: Diagnosis not present

## 2024-06-16 DIAGNOSIS — D508 Other iron deficiency anemias: Secondary | ICD-10-CM | POA: Diagnosis not present

## 2024-06-16 DIAGNOSIS — N2581 Secondary hyperparathyroidism of renal origin: Secondary | ICD-10-CM | POA: Diagnosis not present

## 2024-06-16 DIAGNOSIS — D631 Anemia in chronic kidney disease: Secondary | ICD-10-CM | POA: Diagnosis not present

## 2024-06-16 DIAGNOSIS — I10 Essential (primary) hypertension: Secondary | ICD-10-CM | POA: Diagnosis not present

## 2024-06-16 DIAGNOSIS — R2689 Other abnormalities of gait and mobility: Secondary | ICD-10-CM | POA: Diagnosis not present

## 2024-06-17 DIAGNOSIS — R197 Diarrhea, unspecified: Secondary | ICD-10-CM | POA: Diagnosis not present

## 2024-06-17 DIAGNOSIS — Z992 Dependence on renal dialysis: Secondary | ICD-10-CM | POA: Diagnosis not present

## 2024-06-17 DIAGNOSIS — R531 Weakness: Secondary | ICD-10-CM | POA: Diagnosis not present

## 2024-06-17 DIAGNOSIS — R2689 Other abnormalities of gait and mobility: Secondary | ICD-10-CM | POA: Diagnosis not present

## 2024-06-17 DIAGNOSIS — L03115 Cellulitis of right lower limb: Secondary | ICD-10-CM | POA: Diagnosis not present

## 2024-06-17 DIAGNOSIS — N186 End stage renal disease: Secondary | ICD-10-CM | POA: Diagnosis not present

## 2024-06-17 DIAGNOSIS — Z7409 Other reduced mobility: Secondary | ICD-10-CM | POA: Diagnosis not present

## 2024-06-17 DIAGNOSIS — M6281 Muscle weakness (generalized): Secondary | ICD-10-CM | POA: Diagnosis not present

## 2024-06-21 DIAGNOSIS — Z7409 Other reduced mobility: Secondary | ICD-10-CM | POA: Diagnosis not present

## 2024-06-21 DIAGNOSIS — D508 Other iron deficiency anemias: Secondary | ICD-10-CM | POA: Diagnosis not present

## 2024-06-21 DIAGNOSIS — N2581 Secondary hyperparathyroidism of renal origin: Secondary | ICD-10-CM | POA: Diagnosis not present

## 2024-06-21 DIAGNOSIS — J9610 Chronic respiratory failure, unspecified whether with hypoxia or hypercapnia: Secondary | ICD-10-CM | POA: Diagnosis not present

## 2024-06-21 DIAGNOSIS — Z9981 Dependence on supplemental oxygen: Secondary | ICD-10-CM | POA: Diagnosis not present

## 2024-06-21 DIAGNOSIS — R531 Weakness: Secondary | ICD-10-CM | POA: Diagnosis not present

## 2024-06-21 DIAGNOSIS — Z992 Dependence on renal dialysis: Secondary | ICD-10-CM | POA: Diagnosis not present

## 2024-06-21 DIAGNOSIS — E1122 Type 2 diabetes mellitus with diabetic chronic kidney disease: Secondary | ICD-10-CM | POA: Diagnosis not present

## 2024-06-21 DIAGNOSIS — D631 Anemia in chronic kidney disease: Secondary | ICD-10-CM | POA: Diagnosis not present

## 2024-06-21 DIAGNOSIS — R197 Diarrhea, unspecified: Secondary | ICD-10-CM | POA: Diagnosis not present

## 2024-06-21 DIAGNOSIS — N186 End stage renal disease: Secondary | ICD-10-CM | POA: Diagnosis not present

## 2024-06-22 DIAGNOSIS — Z7409 Other reduced mobility: Secondary | ICD-10-CM | POA: Diagnosis not present

## 2024-06-22 DIAGNOSIS — N186 End stage renal disease: Secondary | ICD-10-CM | POA: Diagnosis not present

## 2024-06-22 DIAGNOSIS — Z992 Dependence on renal dialysis: Secondary | ICD-10-CM | POA: Diagnosis not present

## 2024-06-22 DIAGNOSIS — R609 Edema, unspecified: Secondary | ICD-10-CM | POA: Diagnosis not present

## 2024-06-22 DIAGNOSIS — Z91199 Patient's noncompliance with other medical treatment and regimen due to unspecified reason: Secondary | ICD-10-CM | POA: Diagnosis not present

## 2024-06-22 DIAGNOSIS — R531 Weakness: Secondary | ICD-10-CM | POA: Diagnosis not present

## 2024-06-22 DIAGNOSIS — L03115 Cellulitis of right lower limb: Secondary | ICD-10-CM | POA: Diagnosis not present

## 2024-06-22 DIAGNOSIS — R197 Diarrhea, unspecified: Secondary | ICD-10-CM | POA: Diagnosis not present

## 2024-06-23 DIAGNOSIS — N186 End stage renal disease: Secondary | ICD-10-CM | POA: Diagnosis not present

## 2024-06-23 DIAGNOSIS — Z992 Dependence on renal dialysis: Secondary | ICD-10-CM | POA: Diagnosis not present

## 2024-06-23 DIAGNOSIS — D508 Other iron deficiency anemias: Secondary | ICD-10-CM | POA: Diagnosis not present

## 2024-06-23 DIAGNOSIS — D631 Anemia in chronic kidney disease: Secondary | ICD-10-CM | POA: Diagnosis not present

## 2024-06-23 DIAGNOSIS — E1122 Type 2 diabetes mellitus with diabetic chronic kidney disease: Secondary | ICD-10-CM | POA: Diagnosis not present

## 2024-06-23 DIAGNOSIS — N2581 Secondary hyperparathyroidism of renal origin: Secondary | ICD-10-CM | POA: Diagnosis not present

## 2024-06-26 DIAGNOSIS — R110 Nausea: Secondary | ICD-10-CM | POA: Diagnosis not present

## 2024-06-26 DIAGNOSIS — R531 Weakness: Secondary | ICD-10-CM | POA: Diagnosis not present

## 2024-06-26 DIAGNOSIS — R609 Edema, unspecified: Secondary | ICD-10-CM | POA: Diagnosis not present

## 2024-06-26 DIAGNOSIS — Z9981 Dependence on supplemental oxygen: Secondary | ICD-10-CM | POA: Diagnosis not present

## 2024-06-26 DIAGNOSIS — Z7409 Other reduced mobility: Secondary | ICD-10-CM | POA: Diagnosis not present

## 2024-06-26 DIAGNOSIS — J9610 Chronic respiratory failure, unspecified whether with hypoxia or hypercapnia: Secondary | ICD-10-CM | POA: Diagnosis not present

## 2024-06-26 DIAGNOSIS — Z992 Dependence on renal dialysis: Secondary | ICD-10-CM | POA: Diagnosis not present

## 2024-06-26 DIAGNOSIS — L03115 Cellulitis of right lower limb: Secondary | ICD-10-CM | POA: Diagnosis not present

## 2024-06-26 DIAGNOSIS — N186 End stage renal disease: Secondary | ICD-10-CM | POA: Diagnosis not present

## 2024-06-27 DIAGNOSIS — R531 Weakness: Secondary | ICD-10-CM | POA: Diagnosis not present

## 2024-06-27 DIAGNOSIS — N186 End stage renal disease: Secondary | ICD-10-CM | POA: Diagnosis not present

## 2024-06-27 DIAGNOSIS — R609 Edema, unspecified: Secondary | ICD-10-CM | POA: Diagnosis not present

## 2024-06-27 DIAGNOSIS — L03115 Cellulitis of right lower limb: Secondary | ICD-10-CM | POA: Diagnosis not present

## 2024-06-27 DIAGNOSIS — Z992 Dependence on renal dialysis: Secondary | ICD-10-CM | POA: Diagnosis not present

## 2024-06-27 DIAGNOSIS — R197 Diarrhea, unspecified: Secondary | ICD-10-CM | POA: Diagnosis not present

## 2024-06-27 DIAGNOSIS — Z7409 Other reduced mobility: Secondary | ICD-10-CM | POA: Diagnosis not present

## 2024-06-28 DIAGNOSIS — D508 Other iron deficiency anemias: Secondary | ICD-10-CM | POA: Diagnosis not present

## 2024-06-28 DIAGNOSIS — Z992 Dependence on renal dialysis: Secondary | ICD-10-CM | POA: Diagnosis not present

## 2024-06-28 DIAGNOSIS — E1122 Type 2 diabetes mellitus with diabetic chronic kidney disease: Secondary | ICD-10-CM | POA: Diagnosis not present

## 2024-06-28 DIAGNOSIS — D631 Anemia in chronic kidney disease: Secondary | ICD-10-CM | POA: Diagnosis not present

## 2024-06-28 DIAGNOSIS — N2581 Secondary hyperparathyroidism of renal origin: Secondary | ICD-10-CM | POA: Diagnosis not present

## 2024-06-28 DIAGNOSIS — N186 End stage renal disease: Secondary | ICD-10-CM | POA: Diagnosis not present

## 2024-06-29 DIAGNOSIS — R609 Edema, unspecified: Secondary | ICD-10-CM | POA: Diagnosis not present

## 2024-06-29 DIAGNOSIS — R531 Weakness: Secondary | ICD-10-CM | POA: Diagnosis not present

## 2024-06-29 DIAGNOSIS — Z7409 Other reduced mobility: Secondary | ICD-10-CM | POA: Diagnosis not present

## 2024-06-29 DIAGNOSIS — N186 End stage renal disease: Secondary | ICD-10-CM | POA: Diagnosis not present

## 2024-06-29 DIAGNOSIS — Z992 Dependence on renal dialysis: Secondary | ICD-10-CM | POA: Diagnosis not present

## 2024-06-30 DIAGNOSIS — D631 Anemia in chronic kidney disease: Secondary | ICD-10-CM | POA: Diagnosis not present

## 2024-06-30 DIAGNOSIS — N2581 Secondary hyperparathyroidism of renal origin: Secondary | ICD-10-CM | POA: Diagnosis not present

## 2024-06-30 DIAGNOSIS — Z992 Dependence on renal dialysis: Secondary | ICD-10-CM | POA: Diagnosis not present

## 2024-06-30 DIAGNOSIS — D508 Other iron deficiency anemias: Secondary | ICD-10-CM | POA: Diagnosis not present

## 2024-06-30 DIAGNOSIS — N186 End stage renal disease: Secondary | ICD-10-CM | POA: Diagnosis not present

## 2024-06-30 DIAGNOSIS — F5105 Insomnia due to other mental disorder: Secondary | ICD-10-CM | POA: Diagnosis not present

## 2024-06-30 DIAGNOSIS — F331 Major depressive disorder, recurrent, moderate: Secondary | ICD-10-CM | POA: Diagnosis not present

## 2024-06-30 DIAGNOSIS — G8929 Other chronic pain: Secondary | ICD-10-CM | POA: Diagnosis not present

## 2024-06-30 DIAGNOSIS — F411 Generalized anxiety disorder: Secondary | ICD-10-CM | POA: Diagnosis not present

## 2024-06-30 DIAGNOSIS — E1122 Type 2 diabetes mellitus with diabetic chronic kidney disease: Secondary | ICD-10-CM | POA: Diagnosis not present

## 2024-07-01 DIAGNOSIS — Z7409 Other reduced mobility: Secondary | ICD-10-CM | POA: Diagnosis not present

## 2024-07-01 DIAGNOSIS — R609 Edema, unspecified: Secondary | ICD-10-CM | POA: Diagnosis not present

## 2024-07-01 DIAGNOSIS — R531 Weakness: Secondary | ICD-10-CM | POA: Diagnosis not present

## 2024-07-01 DIAGNOSIS — N186 End stage renal disease: Secondary | ICD-10-CM | POA: Diagnosis not present

## 2024-07-01 DIAGNOSIS — Z992 Dependence on renal dialysis: Secondary | ICD-10-CM | POA: Diagnosis not present

## 2024-07-02 DIAGNOSIS — Z992 Dependence on renal dialysis: Secondary | ICD-10-CM | POA: Diagnosis not present

## 2024-07-02 DIAGNOSIS — Z7409 Other reduced mobility: Secondary | ICD-10-CM | POA: Diagnosis not present

## 2024-07-02 DIAGNOSIS — R531 Weakness: Secondary | ICD-10-CM | POA: Diagnosis not present

## 2024-07-02 DIAGNOSIS — R609 Edema, unspecified: Secondary | ICD-10-CM | POA: Diagnosis not present

## 2024-07-02 DIAGNOSIS — I739 Peripheral vascular disease, unspecified: Secondary | ICD-10-CM | POA: Diagnosis not present

## 2024-07-02 DIAGNOSIS — N186 End stage renal disease: Secondary | ICD-10-CM | POA: Diagnosis not present

## 2024-07-03 DIAGNOSIS — R531 Weakness: Secondary | ICD-10-CM | POA: Diagnosis not present

## 2024-07-03 DIAGNOSIS — R110 Nausea: Secondary | ICD-10-CM | POA: Diagnosis not present

## 2024-07-03 DIAGNOSIS — F418 Other specified anxiety disorders: Secondary | ICD-10-CM | POA: Diagnosis not present

## 2024-07-03 DIAGNOSIS — Z7409 Other reduced mobility: Secondary | ICD-10-CM | POA: Diagnosis not present

## 2024-07-03 DIAGNOSIS — Z992 Dependence on renal dialysis: Secondary | ICD-10-CM | POA: Diagnosis not present

## 2024-07-03 DIAGNOSIS — N186 End stage renal disease: Secondary | ICD-10-CM | POA: Diagnosis not present

## 2024-07-04 ENCOUNTER — Ambulatory Visit: Admitting: Podiatry

## 2024-07-05 DIAGNOSIS — N186 End stage renal disease: Secondary | ICD-10-CM | POA: Diagnosis not present

## 2024-07-05 DIAGNOSIS — N2581 Secondary hyperparathyroidism of renal origin: Secondary | ICD-10-CM | POA: Diagnosis not present

## 2024-07-05 DIAGNOSIS — Z992 Dependence on renal dialysis: Secondary | ICD-10-CM | POA: Diagnosis not present

## 2024-07-05 DIAGNOSIS — D508 Other iron deficiency anemias: Secondary | ICD-10-CM | POA: Diagnosis not present

## 2024-07-05 DIAGNOSIS — D631 Anemia in chronic kidney disease: Secondary | ICD-10-CM | POA: Diagnosis not present

## 2024-07-05 DIAGNOSIS — E1122 Type 2 diabetes mellitus with diabetic chronic kidney disease: Secondary | ICD-10-CM | POA: Diagnosis not present

## 2024-07-06 DIAGNOSIS — Z992 Dependence on renal dialysis: Secondary | ICD-10-CM | POA: Diagnosis not present

## 2024-07-06 DIAGNOSIS — N186 End stage renal disease: Secondary | ICD-10-CM | POA: Diagnosis not present

## 2024-07-06 DIAGNOSIS — R609 Edema, unspecified: Secondary | ICD-10-CM | POA: Diagnosis not present

## 2024-07-06 DIAGNOSIS — M79604 Pain in right leg: Secondary | ICD-10-CM | POA: Diagnosis not present

## 2024-07-06 DIAGNOSIS — Z91199 Patient's noncompliance with other medical treatment and regimen due to unspecified reason: Secondary | ICD-10-CM | POA: Diagnosis not present

## 2024-07-06 DIAGNOSIS — R531 Weakness: Secondary | ICD-10-CM | POA: Diagnosis not present

## 2024-07-06 DIAGNOSIS — I739 Peripheral vascular disease, unspecified: Secondary | ICD-10-CM | POA: Diagnosis not present

## 2024-07-06 DIAGNOSIS — Z7409 Other reduced mobility: Secondary | ICD-10-CM | POA: Diagnosis not present

## 2024-07-07 DIAGNOSIS — D508 Other iron deficiency anemias: Secondary | ICD-10-CM | POA: Diagnosis not present

## 2024-07-07 DIAGNOSIS — Z992 Dependence on renal dialysis: Secondary | ICD-10-CM | POA: Diagnosis not present

## 2024-07-07 DIAGNOSIS — N2581 Secondary hyperparathyroidism of renal origin: Secondary | ICD-10-CM | POA: Diagnosis not present

## 2024-07-07 DIAGNOSIS — N186 End stage renal disease: Secondary | ICD-10-CM | POA: Diagnosis not present

## 2024-07-07 DIAGNOSIS — E1122 Type 2 diabetes mellitus with diabetic chronic kidney disease: Secondary | ICD-10-CM | POA: Diagnosis not present

## 2024-07-07 DIAGNOSIS — D631 Anemia in chronic kidney disease: Secondary | ICD-10-CM | POA: Diagnosis not present

## 2024-07-08 DIAGNOSIS — L97811 Non-pressure chronic ulcer of other part of right lower leg limited to breakdown of skin: Secondary | ICD-10-CM | POA: Diagnosis not present

## 2024-07-08 DIAGNOSIS — R531 Weakness: Secondary | ICD-10-CM | POA: Diagnosis not present

## 2024-07-08 DIAGNOSIS — I739 Peripheral vascular disease, unspecified: Secondary | ICD-10-CM | POA: Diagnosis not present

## 2024-07-08 DIAGNOSIS — Z7409 Other reduced mobility: Secondary | ICD-10-CM | POA: Diagnosis not present

## 2024-07-08 DIAGNOSIS — N186 End stage renal disease: Secondary | ICD-10-CM | POA: Diagnosis not present

## 2024-07-08 DIAGNOSIS — R609 Edema, unspecified: Secondary | ICD-10-CM | POA: Diagnosis not present

## 2024-07-08 DIAGNOSIS — M79604 Pain in right leg: Secondary | ICD-10-CM | POA: Diagnosis not present

## 2024-07-08 DIAGNOSIS — E039 Hypothyroidism, unspecified: Secondary | ICD-10-CM | POA: Diagnosis not present

## 2024-07-08 DIAGNOSIS — F339 Major depressive disorder, recurrent, unspecified: Secondary | ICD-10-CM | POA: Diagnosis not present

## 2024-07-08 DIAGNOSIS — Z91199 Patient's noncompliance with other medical treatment and regimen due to unspecified reason: Secondary | ICD-10-CM | POA: Diagnosis not present

## 2024-07-08 DIAGNOSIS — D649 Anemia, unspecified: Secondary | ICD-10-CM | POA: Diagnosis not present

## 2024-07-08 DIAGNOSIS — J9610 Chronic respiratory failure, unspecified whether with hypoxia or hypercapnia: Secondary | ICD-10-CM | POA: Diagnosis not present

## 2024-07-08 DIAGNOSIS — Z992 Dependence on renal dialysis: Secondary | ICD-10-CM | POA: Diagnosis not present

## 2024-07-08 DIAGNOSIS — Z9981 Dependence on supplemental oxygen: Secondary | ICD-10-CM | POA: Diagnosis not present

## 2024-07-09 DIAGNOSIS — Z992 Dependence on renal dialysis: Secondary | ICD-10-CM | POA: Diagnosis not present

## 2024-07-09 DIAGNOSIS — Z7409 Other reduced mobility: Secondary | ICD-10-CM | POA: Diagnosis not present

## 2024-07-09 DIAGNOSIS — L03115 Cellulitis of right lower limb: Secondary | ICD-10-CM | POA: Diagnosis not present

## 2024-07-09 DIAGNOSIS — B372 Candidiasis of skin and nail: Secondary | ICD-10-CM | POA: Diagnosis not present

## 2024-07-09 DIAGNOSIS — R531 Weakness: Secondary | ICD-10-CM | POA: Diagnosis not present

## 2024-07-09 DIAGNOSIS — I739 Peripheral vascular disease, unspecified: Secondary | ICD-10-CM | POA: Diagnosis not present

## 2024-07-09 DIAGNOSIS — M79604 Pain in right leg: Secondary | ICD-10-CM | POA: Diagnosis not present

## 2024-07-09 DIAGNOSIS — N186 End stage renal disease: Secondary | ICD-10-CM | POA: Diagnosis not present

## 2024-07-10 DIAGNOSIS — L03115 Cellulitis of right lower limb: Secondary | ICD-10-CM | POA: Diagnosis not present

## 2024-07-10 DIAGNOSIS — M79604 Pain in right leg: Secondary | ICD-10-CM | POA: Diagnosis not present

## 2024-07-10 DIAGNOSIS — Z7409 Other reduced mobility: Secondary | ICD-10-CM | POA: Diagnosis not present

## 2024-07-10 DIAGNOSIS — I739 Peripheral vascular disease, unspecified: Secondary | ICD-10-CM | POA: Diagnosis not present

## 2024-07-10 DIAGNOSIS — R531 Weakness: Secondary | ICD-10-CM | POA: Diagnosis not present

## 2024-07-10 DIAGNOSIS — Z992 Dependence on renal dialysis: Secondary | ICD-10-CM | POA: Diagnosis not present

## 2024-07-10 DIAGNOSIS — N186 End stage renal disease: Secondary | ICD-10-CM | POA: Diagnosis not present

## 2024-07-11 DIAGNOSIS — K219 Gastro-esophageal reflux disease without esophagitis: Secondary | ICD-10-CM | POA: Diagnosis not present

## 2024-07-11 DIAGNOSIS — D508 Other iron deficiency anemias: Secondary | ICD-10-CM | POA: Diagnosis not present

## 2024-07-11 DIAGNOSIS — K5641 Fecal impaction: Secondary | ICD-10-CM | POA: Diagnosis not present

## 2024-07-11 DIAGNOSIS — D649 Anemia, unspecified: Secondary | ICD-10-CM | POA: Diagnosis not present

## 2024-07-11 DIAGNOSIS — R0602 Shortness of breath: Secondary | ICD-10-CM | POA: Diagnosis not present

## 2024-07-11 DIAGNOSIS — F339 Major depressive disorder, recurrent, unspecified: Secondary | ICD-10-CM | POA: Diagnosis not present

## 2024-07-11 DIAGNOSIS — Z992 Dependence on renal dialysis: Secondary | ICD-10-CM | POA: Diagnosis not present

## 2024-07-11 DIAGNOSIS — M6281 Muscle weakness (generalized): Secondary | ICD-10-CM | POA: Diagnosis not present

## 2024-07-11 DIAGNOSIS — Z9981 Dependence on supplemental oxygen: Secondary | ICD-10-CM | POA: Diagnosis not present

## 2024-07-11 DIAGNOSIS — R262 Difficulty in walking, not elsewhere classified: Secondary | ICD-10-CM | POA: Diagnosis not present

## 2024-07-11 DIAGNOSIS — J9611 Chronic respiratory failure with hypoxia: Secondary | ICD-10-CM | POA: Diagnosis not present

## 2024-07-11 DIAGNOSIS — L03115 Cellulitis of right lower limb: Secondary | ICD-10-CM | POA: Diagnosis not present

## 2024-07-11 DIAGNOSIS — E039 Hypothyroidism, unspecified: Secondary | ICD-10-CM | POA: Diagnosis not present

## 2024-07-11 DIAGNOSIS — I739 Peripheral vascular disease, unspecified: Secondary | ICD-10-CM | POA: Diagnosis not present

## 2024-07-11 DIAGNOSIS — D631 Anemia in chronic kidney disease: Secondary | ICD-10-CM | POA: Diagnosis not present

## 2024-07-11 DIAGNOSIS — E1122 Type 2 diabetes mellitus with diabetic chronic kidney disease: Secondary | ICD-10-CM | POA: Diagnosis not present

## 2024-07-11 DIAGNOSIS — L97811 Non-pressure chronic ulcer of other part of right lower leg limited to breakdown of skin: Secondary | ICD-10-CM | POA: Diagnosis not present

## 2024-07-11 DIAGNOSIS — N186 End stage renal disease: Secondary | ICD-10-CM | POA: Diagnosis not present

## 2024-07-11 DIAGNOSIS — I10 Essential (primary) hypertension: Secondary | ICD-10-CM | POA: Diagnosis not present

## 2024-07-11 DIAGNOSIS — K649 Unspecified hemorrhoids: Secondary | ICD-10-CM | POA: Diagnosis not present

## 2024-07-11 DIAGNOSIS — M79604 Pain in right leg: Secondary | ICD-10-CM | POA: Diagnosis not present

## 2024-07-11 DIAGNOSIS — F5105 Insomnia due to other mental disorder: Secondary | ICD-10-CM | POA: Diagnosis not present

## 2024-07-11 DIAGNOSIS — J449 Chronic obstructive pulmonary disease, unspecified: Secondary | ICD-10-CM | POA: Diagnosis not present

## 2024-07-11 DIAGNOSIS — R609 Edema, unspecified: Secondary | ICD-10-CM | POA: Diagnosis not present

## 2024-07-11 DIAGNOSIS — I2510 Atherosclerotic heart disease of native coronary artery without angina pectoris: Secondary | ICD-10-CM | POA: Diagnosis not present

## 2024-07-11 DIAGNOSIS — I4729 Other ventricular tachycardia: Secondary | ICD-10-CM | POA: Diagnosis not present

## 2024-07-11 DIAGNOSIS — I509 Heart failure, unspecified: Secondary | ICD-10-CM | POA: Diagnosis not present

## 2024-07-11 DIAGNOSIS — Z7409 Other reduced mobility: Secondary | ICD-10-CM | POA: Diagnosis not present

## 2024-07-11 DIAGNOSIS — K5900 Constipation, unspecified: Secondary | ICD-10-CM | POA: Diagnosis not present

## 2024-07-11 DIAGNOSIS — R531 Weakness: Secondary | ICD-10-CM | POA: Diagnosis not present

## 2024-07-11 DIAGNOSIS — F419 Anxiety disorder, unspecified: Secondary | ICD-10-CM | POA: Diagnosis not present

## 2024-07-11 DIAGNOSIS — J918 Pleural effusion in other conditions classified elsewhere: Secondary | ICD-10-CM | POA: Diagnosis not present

## 2024-07-11 DIAGNOSIS — G8929 Other chronic pain: Secondary | ICD-10-CM | POA: Diagnosis not present

## 2024-07-11 DIAGNOSIS — N6019 Diffuse cystic mastopathy of unspecified breast: Secondary | ICD-10-CM | POA: Diagnosis not present

## 2024-07-11 DIAGNOSIS — Z880 Allergy status to penicillin: Secondary | ICD-10-CM | POA: Diagnosis not present

## 2024-07-11 DIAGNOSIS — N2581 Secondary hyperparathyroidism of renal origin: Secondary | ICD-10-CM | POA: Diagnosis not present

## 2024-07-11 DIAGNOSIS — B372 Candidiasis of skin and nail: Secondary | ICD-10-CM | POA: Diagnosis not present

## 2024-07-11 DIAGNOSIS — R2689 Other abnormalities of gait and mobility: Secondary | ICD-10-CM | POA: Diagnosis not present

## 2024-07-12 DIAGNOSIS — D508 Other iron deficiency anemias: Secondary | ICD-10-CM | POA: Diagnosis not present

## 2024-07-12 DIAGNOSIS — D631 Anemia in chronic kidney disease: Secondary | ICD-10-CM | POA: Diagnosis not present

## 2024-07-12 DIAGNOSIS — N186 End stage renal disease: Secondary | ICD-10-CM | POA: Diagnosis not present

## 2024-07-12 DIAGNOSIS — E1122 Type 2 diabetes mellitus with diabetic chronic kidney disease: Secondary | ICD-10-CM | POA: Diagnosis not present

## 2024-07-12 DIAGNOSIS — Z992 Dependence on renal dialysis: Secondary | ICD-10-CM | POA: Diagnosis not present

## 2024-07-12 DIAGNOSIS — N2581 Secondary hyperparathyroidism of renal origin: Secondary | ICD-10-CM | POA: Diagnosis not present

## 2024-07-13 DIAGNOSIS — K5641 Fecal impaction: Secondary | ICD-10-CM | POA: Diagnosis not present

## 2024-07-13 DIAGNOSIS — I10 Essential (primary) hypertension: Secondary | ICD-10-CM | POA: Diagnosis not present

## 2024-07-13 DIAGNOSIS — Z7409 Other reduced mobility: Secondary | ICD-10-CM | POA: Diagnosis not present

## 2024-07-13 DIAGNOSIS — R531 Weakness: Secondary | ICD-10-CM | POA: Diagnosis not present

## 2024-07-13 DIAGNOSIS — R2689 Other abnormalities of gait and mobility: Secondary | ICD-10-CM | POA: Diagnosis not present

## 2024-07-13 DIAGNOSIS — N186 End stage renal disease: Secondary | ICD-10-CM | POA: Diagnosis not present

## 2024-07-13 DIAGNOSIS — Z992 Dependence on renal dialysis: Secondary | ICD-10-CM | POA: Diagnosis not present

## 2024-07-13 DIAGNOSIS — J9611 Chronic respiratory failure with hypoxia: Secondary | ICD-10-CM | POA: Diagnosis not present

## 2024-07-13 DIAGNOSIS — K5900 Constipation, unspecified: Secondary | ICD-10-CM | POA: Diagnosis not present

## 2024-07-13 DIAGNOSIS — I739 Peripheral vascular disease, unspecified: Secondary | ICD-10-CM | POA: Diagnosis not present

## 2024-07-14 ENCOUNTER — Other Ambulatory Visit

## 2024-07-14 ENCOUNTER — Encounter

## 2024-07-14 DIAGNOSIS — K5900 Constipation, unspecified: Secondary | ICD-10-CM | POA: Diagnosis not present

## 2024-07-14 DIAGNOSIS — K5641 Fecal impaction: Secondary | ICD-10-CM | POA: Diagnosis not present

## 2024-07-14 DIAGNOSIS — N186 End stage renal disease: Secondary | ICD-10-CM | POA: Diagnosis not present

## 2024-07-14 DIAGNOSIS — R531 Weakness: Secondary | ICD-10-CM | POA: Diagnosis not present

## 2024-07-14 DIAGNOSIS — Z7409 Other reduced mobility: Secondary | ICD-10-CM | POA: Diagnosis not present

## 2024-07-14 DIAGNOSIS — K649 Unspecified hemorrhoids: Secondary | ICD-10-CM | POA: Diagnosis not present

## 2024-07-14 DIAGNOSIS — Z992 Dependence on renal dialysis: Secondary | ICD-10-CM | POA: Diagnosis not present

## 2024-07-15 DIAGNOSIS — L03115 Cellulitis of right lower limb: Secondary | ICD-10-CM | POA: Diagnosis not present

## 2024-07-15 DIAGNOSIS — N186 End stage renal disease: Secondary | ICD-10-CM | POA: Diagnosis not present

## 2024-07-15 DIAGNOSIS — Z992 Dependence on renal dialysis: Secondary | ICD-10-CM | POA: Diagnosis not present

## 2024-07-15 DIAGNOSIS — R0602 Shortness of breath: Secondary | ICD-10-CM | POA: Diagnosis not present

## 2024-07-16 DIAGNOSIS — R531 Weakness: Secondary | ICD-10-CM | POA: Diagnosis not present

## 2024-07-16 DIAGNOSIS — N186 End stage renal disease: Secondary | ICD-10-CM | POA: Diagnosis not present

## 2024-07-16 DIAGNOSIS — Z7409 Other reduced mobility: Secondary | ICD-10-CM | POA: Diagnosis not present

## 2024-07-16 DIAGNOSIS — I509 Heart failure, unspecified: Secondary | ICD-10-CM | POA: Diagnosis not present

## 2024-07-16 DIAGNOSIS — J811 Chronic pulmonary edema: Secondary | ICD-10-CM | POA: Diagnosis not present

## 2024-07-16 DIAGNOSIS — Z992 Dependence on renal dialysis: Secondary | ICD-10-CM | POA: Diagnosis not present

## 2024-07-17 ENCOUNTER — Encounter (HOSPITAL_COMMUNITY): Payer: Self-pay

## 2024-07-17 ENCOUNTER — Emergency Department (HOSPITAL_COMMUNITY)

## 2024-07-17 ENCOUNTER — Other Ambulatory Visit: Payer: Self-pay

## 2024-07-17 ENCOUNTER — Observation Stay (HOSPITAL_COMMUNITY)
Admission: EM | Admit: 2024-07-17 | Discharge: 2024-07-20 | Disposition: A | Discharge status: Home Health | Attending: Internal Medicine | Admitting: Internal Medicine

## 2024-07-17 DIAGNOSIS — R918 Other nonspecific abnormal finding of lung field: Secondary | ICD-10-CM | POA: Diagnosis not present

## 2024-07-17 DIAGNOSIS — Z992 Dependence on renal dialysis: Secondary | ICD-10-CM | POA: Insufficient documentation

## 2024-07-17 DIAGNOSIS — I132 Hypertensive heart and chronic kidney disease with heart failure and with stage 5 chronic kidney disease, or end stage renal disease: Secondary | ICD-10-CM | POA: Insufficient documentation

## 2024-07-17 DIAGNOSIS — E1122 Type 2 diabetes mellitus with diabetic chronic kidney disease: Secondary | ICD-10-CM | POA: Insufficient documentation

## 2024-07-17 DIAGNOSIS — R0689 Other abnormalities of breathing: Secondary | ICD-10-CM | POA: Diagnosis not present

## 2024-07-17 DIAGNOSIS — E785 Hyperlipidemia, unspecified: Secondary | ICD-10-CM | POA: Diagnosis not present

## 2024-07-17 DIAGNOSIS — E119 Type 2 diabetes mellitus without complications: Secondary | ICD-10-CM

## 2024-07-17 DIAGNOSIS — I700 Atherosclerosis of aorta: Secondary | ICD-10-CM | POA: Diagnosis not present

## 2024-07-17 DIAGNOSIS — F419 Anxiety disorder, unspecified: Secondary | ICD-10-CM | POA: Diagnosis present

## 2024-07-17 DIAGNOSIS — N186 End stage renal disease: Secondary | ICD-10-CM | POA: Diagnosis not present

## 2024-07-17 DIAGNOSIS — E039 Hypothyroidism, unspecified: Secondary | ICD-10-CM | POA: Diagnosis not present

## 2024-07-17 DIAGNOSIS — I2510 Atherosclerotic heart disease of native coronary artery without angina pectoris: Secondary | ICD-10-CM | POA: Diagnosis present

## 2024-07-17 DIAGNOSIS — I5043 Acute on chronic combined systolic (congestive) and diastolic (congestive) heart failure: Principal | ICD-10-CM | POA: Diagnosis present

## 2024-07-17 DIAGNOSIS — Z7982 Long term (current) use of aspirin: Secondary | ICD-10-CM | POA: Insufficient documentation

## 2024-07-17 DIAGNOSIS — Z7409 Other reduced mobility: Secondary | ICD-10-CM | POA: Diagnosis not present

## 2024-07-17 DIAGNOSIS — I509 Heart failure, unspecified: Principal | ICD-10-CM

## 2024-07-17 DIAGNOSIS — I10 Essential (primary) hypertension: Secondary | ICD-10-CM | POA: Diagnosis present

## 2024-07-17 DIAGNOSIS — F32A Depression, unspecified: Secondary | ICD-10-CM | POA: Diagnosis not present

## 2024-07-17 DIAGNOSIS — J90 Pleural effusion, not elsewhere classified: Secondary | ICD-10-CM | POA: Diagnosis not present

## 2024-07-17 DIAGNOSIS — J811 Chronic pulmonary edema: Secondary | ICD-10-CM | POA: Diagnosis not present

## 2024-07-17 DIAGNOSIS — R069 Unspecified abnormalities of breathing: Secondary | ICD-10-CM | POA: Diagnosis not present

## 2024-07-17 DIAGNOSIS — J45909 Unspecified asthma, uncomplicated: Secondary | ICD-10-CM | POA: Insufficient documentation

## 2024-07-17 DIAGNOSIS — R0602 Shortness of breath: Secondary | ICD-10-CM | POA: Diagnosis not present

## 2024-07-17 DIAGNOSIS — M549 Dorsalgia, unspecified: Secondary | ICD-10-CM | POA: Diagnosis not present

## 2024-07-17 DIAGNOSIS — R531 Weakness: Secondary | ICD-10-CM | POA: Diagnosis not present

## 2024-07-17 DIAGNOSIS — Z9981 Dependence on supplemental oxygen: Secondary | ICD-10-CM | POA: Diagnosis not present

## 2024-07-17 DIAGNOSIS — I110 Hypertensive heart disease with heart failure: Secondary | ICD-10-CM | POA: Diagnosis not present

## 2024-07-17 LAB — COMPREHENSIVE METABOLIC PANEL WITH GFR
ALT: 14 U/L (ref 0–44)
AST: 18 U/L (ref 15–41)
Albumin: 3.1 g/dL — ABNORMAL LOW (ref 3.5–5.0)
Alkaline Phosphatase: 187 U/L — ABNORMAL HIGH (ref 38–126)
Anion gap: 17 — ABNORMAL HIGH (ref 5–15)
BUN: 58 mg/dL — ABNORMAL HIGH (ref 8–23)
CO2: 21 mmol/L — ABNORMAL LOW (ref 22–32)
Calcium: 9.6 mg/dL (ref 8.9–10.3)
Chloride: 95 mmol/L — ABNORMAL LOW (ref 98–111)
Creatinine, Ser: 6.03 mg/dL — ABNORMAL HIGH (ref 0.44–1.00)
GFR, Estimated: 6 mL/min — ABNORMAL LOW (ref >60)
Glucose, Bld: 163 mg/dL — ABNORMAL HIGH (ref 70–99)
Potassium: 5.1 mmol/L (ref 3.5–5.1)
Sodium: 133 mmol/L — ABNORMAL LOW (ref 135–145)
Total Bilirubin: 0.7 mg/dL (ref 0.0–1.2)
Total Protein: 6.1 g/dL — ABNORMAL LOW (ref 6.5–8.1)

## 2024-07-17 LAB — I-STAT CHEM 8, ED
BUN: 66 mg/dL — ABNORMAL HIGH (ref 8–23)
Calcium, Ion: 1.05 mmol/L — ABNORMAL LOW (ref 1.15–1.40)
Chloride: 101 mmol/L (ref 98–111)
Creatinine, Ser: 6.1 mg/dL — ABNORMAL HIGH (ref 0.44–1.00)
Glucose, Bld: 159 mg/dL — ABNORMAL HIGH (ref 70–99)
HCT: 37 % (ref 36.0–46.0)
Hemoglobin: 12.6 g/dL (ref 12.0–15.0)
Potassium: 5.2 mmol/L — ABNORMAL HIGH (ref 3.5–5.1)
Sodium: 131 mmol/L — ABNORMAL LOW (ref 135–145)
TCO2: 25 mmol/L (ref 22–32)

## 2024-07-17 LAB — TROPONIN I (HIGH SENSITIVITY): Troponin I (High Sensitivity): 42 ng/L — ABNORMAL HIGH (ref <18)

## 2024-07-17 LAB — CBC WITH DIFFERENTIAL/PLATELET
Abs Immature Granulocytes: 0.07 K/uL (ref 0.00–0.07)
Basophils Absolute: 0 K/uL (ref 0.0–0.1)
Basophils Relative: 0 %
Eosinophils Absolute: 0 K/uL (ref 0.0–0.5)
Eosinophils Relative: 0 %
HCT: 37.5 % (ref 36.0–46.0)
Hemoglobin: 11.3 g/dL — ABNORMAL LOW (ref 12.0–15.0)
Immature Granulocytes: 1 %
Lymphocytes Relative: 3 %
Lymphs Abs: 0.3 K/uL — ABNORMAL LOW (ref 0.7–4.0)
MCH: 29.5 pg (ref 26.0–34.0)
MCHC: 30.1 g/dL (ref 30.0–36.0)
MCV: 97.9 fL (ref 80.0–100.0)
Monocytes Absolute: 0.7 K/uL (ref 0.1–1.0)
Monocytes Relative: 6 %
Neutro Abs: 11 K/uL — ABNORMAL HIGH (ref 1.7–7.7)
Neutrophils Relative %: 90 %
Platelets: 210 K/uL (ref 150–400)
RBC: 3.83 MIL/uL — ABNORMAL LOW (ref 3.87–5.11)
RDW: 15.8 % — ABNORMAL HIGH (ref 11.5–15.5)
WBC: 12.1 K/uL — ABNORMAL HIGH (ref 4.0–10.5)
nRBC: 0 % (ref 0.0–0.2)

## 2024-07-17 LAB — BRAIN NATRIURETIC PEPTIDE: B Natriuretic Peptide: >4500 pg/mL — ABNORMAL HIGH (ref 0.0–100.0)

## 2024-07-17 MED ORDER — FUROSEMIDE 10 MG/ML IJ SOLN
80.0000 mg | Freq: Once | INTRAMUSCULAR | Status: AC
  Administered 2024-07-17: 80 mg via INTRAVENOUS
  Filled 2024-07-17: qty 8

## 2024-07-17 MED ORDER — CHLORHEXIDINE GLUCONATE CLOTH 2 % EX PADS
6.0000 | MEDICATED_PAD | Freq: Every day | CUTANEOUS | Status: DC
  Administered 2024-07-19 – 2024-07-20 (×2): 6 via TOPICAL

## 2024-07-17 NOTE — ED Notes (Signed)
 Pt wants nurse to pull from IV.   KM

## 2024-07-17 NOTE — ED Provider Notes (Signed)
 [place] EMERGENCY DEPARTMENT AT [facility] Provider Note   CSN: [id number] Arrival date & time: 07/17/24  2026     Patient presents with: Shortness of Breath   Cassandra Baker is a 83 y.o. female.   Patient is a 83 year old who presents with shortness of breath.  She says it has been going on for about a month but has gotten worse over the last few days.  She currently is living at [facility] and rehab.  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  She has had some progressive lower extremity swelling.  She does not have any fevers.  She recently started having some tightness across her chest has been going on for the last 24 hours.  It now is in her upper back as well.  She said she had some similar symptoms with CHF exacerbation and heart attack in the past.  She is on oxygen chronically at 3 L/min.       Prior to Admission medications   Medication Sig Start Date End Date Taking? Authorizing Provider  acetaminophen  (TYLENOL ) 500 MG tablet Take 500-1,000 mg by mouth every 6 (six) hours as needed (pain).     [provider]  amiodarone  (PACERONE ) 200 MG tablet Take 1 tablet (200 mg total) by mouth daily. 06/27/23   Meng, Hao, PA  aspirin  EC 81 MG tablet Take 1 tablet (81 mg total) by mouth daily. Swallow whole. 06/16/23   Jens Durand, MD  cefdinir (OMNICEF) 300 MG capsule Take 300 mg by mouth 3 (three) times daily with meals as needed.    [provider]  clopidogrel  (PLAVIX ) 75 MG tablet Take 1 tablet (75 mg total) by mouth daily. 06/27/23   Meng, Hao, PA  ferric citrate  (AURYXIA ) 1 GM 210 MG(Fe) tablet Take 210 mg by mouth 3 (three) times daily with meals.    [provider]  furosemide  (LASIX ) 80 MG tablet Take 80 mg by mouth 2 (two) times daily.    [provider]  levothyroxine  (SYNTHROID ) 75 MCG tablet Take 75 mcg by mouth daily before breakfast.     [provider]  LORazepam  (ATIVAN ) 0.5 MG tablet Take 1 tablet (0.5 mg total) by mouth every Monday, Wednesday, and Friday with hemodialysis. 02/15/24   Arlice Reichert, MD  metoprolol  succinate (TOPROL  XL) 25 MG 24 hr tablet Take 0.5 tablets (12.5 mg total) by mouth daily. 11/27/23   Dunn, Bernardino HERO, PA-C  metroNIDAZOLE (FLAGYL) 500 MG tablet Take 500 mg by mouth 3 (three) times daily.    [provider]  nitroGLYCERIN  (NITROSTAT ) 0.4 MG SL tablet Place 1 tablet (0.4 mg total) under the tongue every 5 (five) minutes x 3 doses as needed for chest pain. 06/27/23   Meng, Hao, PA  pantoprazole  (PROTONIX ) 40 MG tablet Take 40 mg by mouth daily. 02/18/24   [provider]  polyethylene glycol (MIRALAX  / GLYCOLAX ) 17 g packet Take 17 g by mouth daily as needed for mild constipation. 02/13/24   Arlice Reichert, MD  rOPINIRole  (REQUIP ) 0.5 MG tablet Take 0.5 mg by mouth at bedtime.    [provider]    Allergies: Plum pulp, Almond (diagnostic), Lemon oil, Other, Peanut-containing drug products, Pork-derived products, and Penicillins    Review of Systems  Constitutional:  Positive for fatigue. Negative for chills, diaphoresis and fever.  HENT:  Negative for congestion, rhinorrhea and sneezing.   Eyes: Negative.  Respiratory:  Positive for shortness of breath. Negative for cough and chest tightness.   Cardiovascular:  Positive for chest pain and leg swelling.  Gastrointestinal:  Negative for abdominal pain, blood in stool, diarrhea, nausea and vomiting.  Genitourinary:  Negative for difficulty urinating, flank pain, frequency and hematuria.  Musculoskeletal:  Positive for back pain. Negative for arthralgias.  Skin:  Negative for rash.  Neurological:  Negative for dizziness, speech difficulty, weakness, numbness and headaches.    Updated Vital Signs BP (!) 172/77   Pulse 79   Temp 98.1 F (36.7 C)   Resp (!) 23   LMP  (LMP Unknown)   SpO2 100%   Physical  Exam Constitutional:      Appearance: She is well-developed.  HENT:     Head: Normocephalic and atraumatic.  Eyes:     Pupils: Pupils are equal, round, and reactive to light.  Cardiovascular:     Rate and Rhythm: Normal rate and regular rhythm.     Heart sounds: Normal heart sounds.  Pulmonary:     Effort: Tachypnea and accessory muscle usage present. No respiratory distress.     Breath sounds: Decreased breath sounds and rales present. No wheezing.  Chest:     Chest wall: No tenderness.  Abdominal:     General: Bowel sounds are normal.     Palpations: Abdomen is soft.     Tenderness: There is no abdominal tenderness. There is no guarding or rebound.  Musculoskeletal:        General: Normal range of motion.     Cervical back: Normal range of motion and neck supple.     Comments: Marked edema of her lower extremities bilaterally.  There is some mild warmth and erythema to both legs.  There is a small superficial appearing ulcerated area to the anterior right lower leg.  Lymphadenopathy:     Cervical: No cervical adenopathy.  Skin:    General: Skin is warm and dry.     Findings: No rash.  Neurological:     Mental Status: She is alert and oriented to person, place, and time.     (all labs ordered are listed, but only abnormal results are displayed) Labs Reviewed  COMPREHENSIVE METABOLIC PANEL WITH GFR - Abnormal; Notable for the following components:      Result Value   Sodium 133 (*)    Chloride 95 (*)    CO2 21 (*)    Glucose, Bld 163 (*)    BUN 58 (*)    Creatinine, Ser 6.03 (*)    Total Protein 6.1 (*)    Albumin  3.1 (*)    Alkaline Phosphatase 187 (*)    GFR, Estimated 6 (*)    Anion gap 17 (*)    All other components within normal limits  CBC WITH DIFFERENTIAL/PLATELET - Abnormal; Notable for the following components:   WBC 12.1 (*)    RBC 3.83 (*)    Hemoglobin 11.3 (*)    RDW 15.8 (*)    Neutro Abs 11.0 (*)    Lymphs Abs 0.3 (*)    All other components  within normal limits  BRAIN NATRIURETIC PEPTIDE - Abnormal; Notable for the following components:   B Natriuretic Peptide >4,500.0 (*)    All other components within normal limits  I-STAT CHEM 8, ED - Abnormal; Notable for the following components:   Sodium 131 (*)    Potassium 5.2 (*)    BUN 66 (*)    Creatinine, Ser 6.10 (*)  Glucose, Bld 159 (*)    Calcium , Ion 1.05 (*)    All other components within normal limits  TROPONIN I (HIGH SENSITIVITY) - Abnormal; Notable for the following components:   Troponin I (High Sensitivity) 42 (*)    All other components within normal limits  TROPONIN I (HIGH SENSITIVITY)    EKG: EKG Interpretation Date/Time:  Sunday July 17 2024 20:36:31 EDT Ventricular Rate:  77 PR Interval:  176 QRS Duration:  105 QT Interval:  359 QTC Calculation: 407 R Axis:   18  Text Interpretation: Sinus rhythm Borderline T abnormalities, lateral leads since last tracing no significant change Confirmed by Lenor Hollering [phone]) on 07/17/2024 8:54:30 PM  Radiology: [facility] Chest Port 1 View Result Date: 07/17/2024 CLINICAL DATA:  Shortness of breath EXAM: PORTABLE CHEST 1 VIEW COMPARISON:  04/17/2024 FINDINGS: Right dialysis catheter remains in place, unchanged. Bilateral lower lobe airspace opacities and layering effusions, similar to prior study. Heart and mediastinal contours within normal limits. Aortic atherosclerosis. IMPRESSION: Bilateral pleural effusions with lower lobe airspace opacities, likely atelectasis. Findings similar to prior study. Electronically Signed   By: Franky Crease M.D.   On: 07/17/2024 21:09     Procedures   Medications Ordered in the ED  furosemide  (LASIX ) injection 80 mg (80 mg Intravenous Given 07/17/24 2201)                                    Medical Decision Making Amount and/or Complexity of Data Reviewed Labs: ordered. Radiology: ordered.  Risk Prescription drug management. Decision regarding hospitalization.   This  patient presents to the ED for concern of shortness of breath, this involves an extensive number of treatment options, and is a complaint that carries with it a high risk of complications and morbidity.  I considered the following differential and admission for this acute, potentially life threatening condition.  The differential diagnosis includes ACS, pulmonary edema, hypertensive emergency, aortic dissection, pneumonia, PE, pneumothorax  MDM:    Patient is a 83 year old who presents with shortness of breath and leg swelling.  Got worse in the last few days.  She does not have any hypoxia on her baseline oxygen.  Her EKG does not show any ischemic changes.  Her first troponin is mildly elevated.  Chest x-ray shows some slight patchiness consistent with fluid overload.  Was given a dose of IV Lasix .  She is actually feeling much better here.  She does not have the tachypnea that she had on arrival.  However I do think that she needs to be admitted and trend her troponins and likely have dialysis.  Discussed with Dr. Sim who will admit the patient for further treatment.  Also discussed with Dr. Geralynn with nephrology  (Labs, imaging, consults)  Labs: I Ordered, and personally interpreted labs.  The pertinent results include: Mild elevation in her troponin, elevated BNP  Imaging Studies ordered: I ordered imaging studies including chest x-ray I independently visualized and interpreted imaging. I agree with the radiologist interpretation  Additional history obtained from chart, family.  External records from outside source obtained and reviewed including prior notes  Cardiac Monitoring: The patient was maintained on a cardiac monitor.  If on the cardiac monitor, I personally viewed and interpreted the cardiac monitored which showed an underlying rhythm of: Sinus rhythm  Reevaluation: After the interventions noted above, I reevaluated the patient and found that they have :improved  Social  Determinants  of Health:    Disposition: Admit to hospital  Co morbidities that complicate the patient evaluation  Past Medical History:  Diagnosis Date   Anemia    Arthritis    low back,knees   Asthma    Basal cell carcinoma (BCC)    a. nose   Carotid artery stenosis    CHF (congestive heart failure) (HCC)    a. prev eval by Dr. Claudene in [place] - many yrs ago.   Depression    Diabetes mellitus without complication (HCC)    ESRD on hemodialysis (HCC)    [place] MWF   Hypertension    Hypothyroidism      Medicines Meds ordered this encounter  Medications   furosemide  (LASIX ) injection 80 mg    I have reviewed the patients home medicines and have made adjustments as needed  Problem List / ED Course: Problem List Items Addressed This Visit   None            Final diagnoses:  None    ED Discharge Orders     None          Lenor Hollering, MD 07/17/24 2344

## 2024-07-17 NOTE — ED Triage Notes (Signed)
 Arrives [facility] from [facility] with c/o shortness of breath.   T, TH, Dialysis but skipped Thursday.  3L [place] baseline.

## 2024-07-18 ENCOUNTER — Observation Stay (HOSPITAL_COMMUNITY)

## 2024-07-18 DIAGNOSIS — I5043 Acute on chronic combined systolic (congestive) and diastolic (congestive) heart failure: Secondary | ICD-10-CM | POA: Diagnosis not present

## 2024-07-18 DIAGNOSIS — I2510 Atherosclerotic heart disease of native coronary artery without angina pectoris: Secondary | ICD-10-CM | POA: Diagnosis not present

## 2024-07-18 DIAGNOSIS — E1122 Type 2 diabetes mellitus with diabetic chronic kidney disease: Secondary | ICD-10-CM

## 2024-07-18 DIAGNOSIS — I5023 Acute on chronic systolic (congestive) heart failure: Secondary | ICD-10-CM

## 2024-07-18 DIAGNOSIS — N184 Chronic kidney disease, stage 4 (severe): Secondary | ICD-10-CM

## 2024-07-18 DIAGNOSIS — N186 End stage renal disease: Secondary | ICD-10-CM | POA: Diagnosis not present

## 2024-07-18 DIAGNOSIS — E8779 Other fluid overload: Secondary | ICD-10-CM

## 2024-07-18 LAB — RENAL FUNCTION PANEL
Albumin: 2.9 g/dL — ABNORMAL LOW (ref 3.5–5.0)
Anion gap: 14 (ref 5–15)
BUN: 58 mg/dL — ABNORMAL HIGH (ref 8–23)
CO2: 23 mmol/L (ref 22–32)
Calcium: 9.6 mg/dL (ref 8.9–10.3)
Chloride: 98 mmol/L (ref 98–111)
Creatinine, Ser: 5.97 mg/dL — ABNORMAL HIGH (ref 0.44–1.00)
GFR, Estimated: 7 mL/min — ABNORMAL LOW (ref >60)
Glucose, Bld: 118 mg/dL — ABNORMAL HIGH (ref 70–99)
Phosphorus: 7.5 mg/dL — ABNORMAL HIGH (ref 2.5–4.6)
Potassium: 5 mmol/L (ref 3.5–5.1)
Sodium: 135 mmol/L (ref 135–145)

## 2024-07-18 LAB — CBC
HCT: 34.8 % — ABNORMAL LOW (ref 36.0–46.0)
Hemoglobin: 10.6 g/dL — ABNORMAL LOW (ref 12.0–15.0)
MCH: 29.9 pg (ref 26.0–34.0)
MCHC: 30.5 g/dL (ref 30.0–36.0)
MCV: 98 fL (ref 80.0–100.0)
Platelets: 191 K/uL (ref 150–400)
RBC: 3.55 MIL/uL — ABNORMAL LOW (ref 3.87–5.11)
RDW: 15.6 % — ABNORMAL HIGH (ref 11.5–15.5)
WBC: 14.7 K/uL — ABNORMAL HIGH (ref 4.0–10.5)
nRBC: 0 % (ref 0.0–0.2)

## 2024-07-18 LAB — HEPATITIS B SURFACE ANTIGEN: Hepatitis B Surface Ag: NONREACTIVE

## 2024-07-18 LAB — TROPONIN I (HIGH SENSITIVITY): Troponin I (High Sensitivity): 45 ng/L — ABNORMAL HIGH (ref <18)

## 2024-07-18 LAB — GLUCOSE, CAPILLARY
Glucose-Capillary: 146 mg/dL — ABNORMAL HIGH (ref 70–99)
Glucose-Capillary: 136 mg/dL — ABNORMAL HIGH (ref 70–99)

## 2024-07-18 LAB — CBG MONITORING, ED: Glucose-Capillary: 95 mg/dL (ref 70–99)

## 2024-07-18 MED ORDER — SORBITOL 70 % SOLN
30.0000 mL | Status: DC | PRN
Start: 2024-07-18 — End: 2024-07-20

## 2024-07-18 MED ORDER — CALCIUM CARBONATE ANTACID 1250 MG/5ML PO SUSP
500.0000 mg | Freq: Four times a day (QID) | ORAL | Status: DC | PRN
Start: 2024-07-18 — End: 2024-07-20

## 2024-07-18 MED ORDER — AMIODARONE HCL 200 MG PO TABS
200.0000 mg | ORAL_TABLET | Freq: Every day | ORAL | Status: DC
  Administered 2024-07-18 – 2024-07-20 (×3): 200 mg via ORAL
  Filled 2024-07-18 (×3): qty 1

## 2024-07-18 MED ORDER — LORAZEPAM 0.5 MG PO TABS: 0.5000 mg | ORAL_TABLET | ORAL | Status: DC

## 2024-07-18 MED ORDER — ACETAMINOPHEN 650 MG RE SUPP: 650.0000 mg | Freq: Four times a day (QID) | RECTAL | Status: DC | PRN

## 2024-07-18 MED ORDER — FUROSEMIDE 40 MG PO TABS
80.0000 mg | ORAL_TABLET | Freq: Two times a day (BID) | ORAL | Status: DC
  Administered 2024-07-18 – 2024-07-20 (×3): 80 mg via ORAL
  Filled 2024-07-18 (×3): qty 2

## 2024-07-18 MED ORDER — ONDANSETRON HCL 4 MG/2ML IJ SOLN
4.0000 mg | Freq: Four times a day (QID) | INTRAMUSCULAR | Status: DC | PRN
Start: 2024-07-18 — End: 2024-07-20

## 2024-07-18 MED ORDER — DOCUSATE SODIUM 283 MG RE ENEM
1.0000 | ENEMA | RECTAL | Status: DC | PRN
Start: 2024-07-18 — End: 2024-07-20

## 2024-07-18 MED ORDER — HYDROXYZINE HCL 25 MG PO TABS
25.0000 mg | ORAL_TABLET | Freq: Three times a day (TID) | ORAL | Status: DC | PRN
Start: 2024-07-18 — End: 2024-07-20

## 2024-07-18 MED ORDER — ATORVASTATIN CALCIUM 80 MG PO TABS
80.0000 mg | ORAL_TABLET | Freq: Every day | ORAL | Status: DC
Start: 2024-07-18 — End: 2024-07-20
  Filled 2024-07-18 (×2): qty 1

## 2024-07-18 MED ORDER — ACETAMINOPHEN 325 MG PO TABS: 650.0000 mg | ORAL_TABLET | Freq: Four times a day (QID) | ORAL | Status: DC | PRN

## 2024-07-18 MED ORDER — CAMPHOR-MENTHOL 0.5-0.5 % EX LOTN: 1.0000 | TOPICAL_LOTION | Freq: Three times a day (TID) | CUTANEOUS | Status: DC | PRN

## 2024-07-18 MED ORDER — ONDANSETRON HCL 4 MG PO TABS: 4.0000 mg | ORAL_TABLET | Freq: Four times a day (QID) | ORAL | Status: DC | PRN

## 2024-07-18 MED ORDER — ASPIRIN 81 MG PO TBEC
81.0000 mg | DELAYED_RELEASE_TABLET | Freq: Every day | ORAL | Status: DC
  Administered 2024-07-18 – 2024-07-20 (×3): 81 mg via ORAL
  Filled 2024-07-18 (×3): qty 1

## 2024-07-18 MED ORDER — INSULIN ASPART 100 UNIT/ML IJ SOLN: 0.0000 [IU] | Freq: Three times a day (TID) | INTRAMUSCULAR | Status: DC

## 2024-07-18 MED ORDER — ALTEPLASE 2 MG IJ SOLR: 2.0000 mg | Freq: Once | INTRAMUSCULAR | Status: DC | PRN

## 2024-07-18 MED ORDER — CLOPIDOGREL BISULFATE 75 MG PO TABS
75.0000 mg | ORAL_TABLET | Freq: Every day | ORAL | Status: DC
  Administered 2024-07-18 – 2024-07-20 (×3): 75 mg via ORAL
  Filled 2024-07-18 (×3): qty 1

## 2024-07-18 MED ORDER — FERRIC CITRATE 1 GM 210 MG(FE) PO TABS
210.0000 mg | ORAL_TABLET | Freq: Three times a day (TID) | ORAL | Status: DC
  Administered 2024-07-18 – 2024-07-20 (×4): 210 mg via ORAL
  Filled 2024-07-18 (×7): qty 1

## 2024-07-18 MED ORDER — SACCHAROMYCES BOULARDII 250 MG PO CAPS
500.0000 mg | ORAL_CAPSULE | Freq: Every morning | ORAL | Status: DC
  Administered 2024-07-20: 500 mg via ORAL
  Filled 2024-07-18 (×3): qty 2

## 2024-07-18 MED ORDER — BISACODYL 10 MG/30ML RE ENEM: 10.0000 mg | ENEMA | Freq: Every day | RECTAL | Status: DC | PRN

## 2024-07-18 MED ORDER — INSULIN ASPART 100 UNIT/ML IJ SOLN: 0.0000 [IU] | Freq: Every day | INTRAMUSCULAR | Status: DC

## 2024-07-18 MED ORDER — METOPROLOL SUCCINATE ER 25 MG PO TB24
12.5000 mg | ORAL_TABLET | Freq: Every day | ORAL | Status: DC
Start: 2024-07-18 — End: 2024-07-20
  Administered 2024-07-19 – 2024-07-20 (×2): 12.5 mg via ORAL
  Filled 2024-07-18 (×2): qty 1

## 2024-07-18 MED ORDER — NEPRO/CARBSTEADY PO LIQD
237.0000 mL | Freq: Three times a day (TID) | ORAL | Status: DC | PRN
Start: 2024-07-18 — End: 2024-07-20

## 2024-07-18 MED ORDER — CEPHALEXIN 500 MG PO CAPS
500.0000 mg | ORAL_CAPSULE | Freq: Two times a day (BID) | ORAL | Status: DC
  Administered 2024-07-18 – 2024-07-20 (×5): 500 mg via ORAL
  Filled 2024-07-18 (×7): qty 1

## 2024-07-18 MED ORDER — HEPARIN SODIUM (PORCINE) 1000 UNIT/ML DIALYSIS
1000.0000 [IU] | INTRAMUSCULAR | Status: DC | PRN
  Administered 2024-07-18: 3200 [IU]

## 2024-07-18 MED ORDER — PANTOPRAZOLE SODIUM 40 MG PO TBEC
40.0000 mg | DELAYED_RELEASE_TABLET | Freq: Every day | ORAL | Status: DC
  Administered 2024-07-19 – 2024-07-20 (×2): 40 mg via ORAL
  Filled 2024-07-18 (×2): qty 1

## 2024-07-18 MED ORDER — HEPARIN SODIUM (PORCINE) 1000 UNIT/ML DIALYSIS: 1500.0000 [IU] | INTRAMUSCULAR | Status: DC | PRN

## 2024-07-18 MED ORDER — LEVOTHYROXINE SODIUM 75 MCG PO TABS
75.0000 ug | ORAL_TABLET | Freq: Every day | ORAL | Status: DC
  Administered 2024-07-19 – 2024-07-20 (×2): 75 ug via ORAL
  Filled 2024-07-18 (×2): qty 1

## 2024-07-18 MED ORDER — FUROSEMIDE 10 MG/ML IJ SOLN
80.0000 mg | Freq: Two times a day (BID) | INTRAMUSCULAR | Status: DC
  Administered 2024-07-18: 80 mg via INTRAVENOUS
  Filled 2024-07-18: qty 8

## 2024-07-18 MED ORDER — HEPARIN SODIUM (PORCINE) 1000 UNIT/ML DIALYSIS: 2000.0000 [IU] | Freq: Once | INTRAMUSCULAR | Status: DC

## 2024-07-18 NOTE — Progress Notes (Signed)
 Patient is a 83 year old female with history of ESRD on hemodialysis, hypertension, diabetes, hypothyroidism who lives at nursing home brought to the emergency room with shortness of breath and leg swelling.  Patient was also recently suffering from leg infection and was on Keflex .  She missed her dialysis x 2.  She did not go as she  felt weak and tired.  Has progressive leg swelling.  At nursing home on 3 L oxygen.  Family also reported dietary noncompliance at the nursing home. At the emergency room, hypoxemic but at her baseline.  EKG without ischemic changes.  Mildly elevated troponins but not significant.  Chest x-ray consistent with interstitial fluid.  Given dose of IV Lasix .  Admitted due to significant symptoms and needing subsequent dialysis.  Fluid overload secondary to missed hemodialysis: Dietary noncompliance.  Counseling done.   Patient is currently receiving hemodialysis today.  Next hemodialysis tomorrow.   Otherwise remains largely stable.   Called and discussed with patient's son over the phone.  Apparently patient is in the process to move from nursing home to assisted living facility at [facility] today.  Patient wants to go home.  Case management alerted for possible discharge today.  Since she is stable and next Alysis is tomorrow at 12 PM she can likely be discharged to [facility] if there are ready to accept her today.   Same-day admit.  No charge visit.  Possible discharge later today or tomorrow.

## 2024-07-18 NOTE — ED Notes (Signed)
 Pt to dialysis.

## 2024-07-18 NOTE — Consult Note (Signed)
 [facility] Renal Consultation Note    Indication for Consultation:  Management of ESRD/hemodialysis; anemia, hypertension/volume and secondary hyperparathyroidism  ERE:Enopuz, Tanda, MD  HPI: Cassandra Baker is a 83 y.o. female with ESRD on HD TTS at [facility]. She has a past medical history significant for diabetes, coronary artery disease, asthma, carotid artery stenosis, depression, essential hypertension, hypothyroidism, and history of basal cell carcinoma.  Patient presents to the ED from her facility c/o worsening SOB. Seen and examined patient on HD. She's tolerating UFG 3L. BP is stable at 120/45. She's on O2 and profoundly overloaded. Her breathing appears mildly labored but she;'s not in acute respiratory distress. She denies CP and N/V. Noted her last HD was on 07/12/24. CXR showed BL pleural effusions with lower lobe airspace opacities (likely atelectasis). Noted BNP > 4500 and other labs otherwise stable. She is a functional TDC.   Past Medical History:  Diagnosis Date   Anemia    Arthritis    low back,knees   Asthma    Basal cell carcinoma (BCC)    a. nose   Carotid artery stenosis    CHF (congestive heart failure) (HCC)    a. prev eval by Dr. Claudene in [place] - many yrs ago.   Depression    Diabetes mellitus without complication (HCC)    ESRD on hemodialysis (HCC)    [place] MWF   Hypertension    Hypothyroidism    Past Surgical History:  Procedure Laterality Date   APPENDECTOMY     4 th grade   AV FISTULA PLACEMENT Left 05/12/2019   Procedure: BRACHIOCEPHALIC ARTERIOVENOUS (AV) FISTULA CREATION LEFT ARM;  Surgeon: Serene Gaile ORN, MD;  Location: [facility] OR;  Service: Vascular;  Laterality: Left;   AV FISTULA PLACEMENT Left 06/22/2023   Procedure: LEFT BRACHIOBASILIC ARTERIOVENOUS (AV) FISTULA CREATION;  Surgeon: Lanis Fonda BRAVO, MD;  Location: [facility] OR;  Service: Vascular;  Laterality: Left;   CORONARY STENT INTERVENTION N/A 06/17/2023   Procedure:  CORONARY STENT INTERVENTION;  Surgeon: Darron Deatrice LABOR, MD;  Location: [facility] INVASIVE CV LAB;  Service: Cardiovascular;  Laterality: N/A;   EYE SURGERY  2014   Bilateral 6 mos. apart  cataracts   FISTULA SUPERFICIALIZATION Left 10/06/2019   Procedure: ELEVATION OF ARTERIOVENOUS FISTULA LEFT ARM;  Surgeon: Serene Gaile ORN, MD;  Location: [facility] OR;  Service: Vascular;  Laterality: Left;   INSERTION OF DIALYSIS CATHETER N/A 06/22/2023   Procedure: INSERTION OF TUNNELED  DIALYSIS CATHETER USING 19CM PALINDROME CATHETER;  Surgeon: Lanis Fonda BRAVO, MD;  Location: [facility] OR;  Service: Vascular;  Laterality: N/A;   LYMPH NODE BIOPSY N/A 02/09/2018   Procedure: SENTINEL LYMPH NODE BIOPSY;  Surgeon: Eloy Herring, MD;  Location: [facility] ORS;  Service: Gynecology;  Laterality: N/A;   PARATHYROIDECTOMY N/A 12/07/2014   Procedure: PARATHYROIDECTOMY;  Surgeon: Krystal Spinner, MD;  Location: [facility] ORS;  Service: General;  Laterality: N/A;   PERIPHERAL INTRAVASCULAR LITHOTRIPSY  06/17/2023   Procedure: PERIPHERAL INTRAVASCULAR LITHOTRIPSY;  Surgeon: Darron Deatrice LABOR, MD;  Location: [facility] INVASIVE CV LAB;  Service: Cardiovascular;;   RIGHT/LEFT HEART CATH AND CORONARY ANGIOGRAPHY N/A 06/15/2023   Procedure: RIGHT/LEFT HEART CATH AND CORONARY ANGIOGRAPHY;  Surgeon: Darron Deatrice LABOR, MD;  Location: [facility] INVASIVE CV LAB;  Service: Cardiovascular;  Laterality: N/A;   ROBOTIC ASSISTED TOTAL HYSTERECTOMY WITH BILATERAL SALPINGO OOPHERECTOMY Bilateral 02/09/2018   Procedure: XI ROBOTIC ASSISTED TOTAL HYSTERECTOMY WITH BILATERAL SALPINGO OOPHORECTOMY;  Surgeon: Eloy Herring, MD;  Location: [facility] ORS;  Service: Gynecology;  Laterality:  Bilateral;   TEMPORARY DIALYSIS CATHETER N/A 06/12/2023   Procedure: TEMPORARY DIALYSIS CATHETER;  Surgeon: Jama Cordella MATSU, MD;  Location: [facility] INVASIVE CV LAB;  Service: Cardiovascular;  Laterality: N/A;   THYROID  LOBECTOMY Left 12/07/2014   Procedure: LEFT THYROID  LOBECTOMY;  Surgeon: Krystal Spinner, MD;  Location: [facility]  ORS;  Service: General;  Laterality: Left;   TONSILLECTOMY     TUBAL LIGATION     Early thirties   Family History  Problem Relation Age of Onset   Hypertension Mother        MI's beginning in 61's, died @ 32   Heart disease Mother    Hypertension Father    Heart disease Father    Heart disease Brother    Stroke Brother    Cervical cancer Maternal Aunt    Heart disease Brother    Stroke Brother    Social History:  reports that she has never smoked. She has never used smokeless tobacco. She reports that she does not drink alcohol and does not use drugs. Allergies  Allergen Reactions   Plum Pulp Anaphylaxis and Cough   Almond (Diagnostic) Itching   Lemon Oil Other (See Comments)    Lemons cause coughing and respiratory issues    Other Itching    Dairy products/Soaps & detergents   Peanut-Containing Drug Products Itching   Pork-Derived Products     Religious beliefs   Penicillins Rash    Tolerates ceftriaxone    Prior to Admission medications   Medication Sig Start Date End Date Taking? Authorizing Provider  acetaminophen  (TYLENOL ) 325 MG tablet Take 650 mg by mouth every 4 (four) hours as needed for mild pain (pain score 1-3). 03/01/24  Yes [provider]  amiodarone  (PACERONE ) 200 MG tablet Take 1 tablet (200 mg total) by mouth daily. 06/27/23  Yes Meng, Hao, PA  aspirin  EC 81 MG tablet Take 1 tablet (81 mg total) by mouth daily. Swallow whole. 06/16/23  Yes Jens Durand, MD  atorvastatin  (LIPITOR ) 80 MG tablet Take 80 mg by mouth at bedtime. 04/19/24  Yes [provider]  bisacodyl  (FLEET) 10 MG/30ML ENEM Place 10 mg rectally daily as needed (mild to moderate constipation).   Yes [provider]  cadexomer iodine (IODOSORB) 0.9 % gel Apply 1 Application topically 3 (three) times a week. Apply to right shin venous ulcer topically every Mon, Weds, and Fri. May apply additional applications as needed.   Yes [provider]  cephALEXin  (KEFLEX )  500 MG capsule Take 500 mg by mouth 2 (two) times daily. 07/09/24 07/19/24 Yes [provider]  clopidogrel  (PLAVIX ) 75 MG tablet Take 1 tablet (75 mg total) by mouth daily. 06/27/23  Yes Meng, Hao, PA  ferric citrate  (AURYXIA ) 1 GM 210 MG(Fe) tablet Take 210 mg by mouth 3 (three) times daily with meals.   Yes [provider]  furosemide  (LASIX ) 80 MG tablet Take 80 mg by mouth 2 (two) times daily.   Yes [provider]  lactulose , encephalopathy, (CHRONULAC ) 10 GM/15ML SOLN Take 20 g by mouth. Give 30ml by mouth one time a day for constipation. 07/14/24 07/24/24 Yes [provider]  levothyroxine  (SYNTHROID ) 75 MCG tablet Take 75 mcg by mouth daily before breakfast.   Yes [provider]  LORazepam  (ATIVAN ) 0.5 MG tablet Take 1 tablet (0.5 mg total) by mouth every Monday, Wednesday, and Friday with hemodialysis. Patient taking differently: Take 0.5 mg by mouth 3 (three) times a week. Give one tablet by mouth one time a  day on Tues, Thurs, and Sat for anxiety. Give one-half tablet (0.25mg ) prior to hemodialysis. 02/15/24  Yes Dahal, Chapman, MD  metolazone (ZAROXOLYN) 10 MG tablet Take 10 mg by mouth. 04/23/24 07/22/24 Yes [provider]  metoprolol  succinate (TOPROL  XL) 25 MG 24 hr tablet Take 0.5 tablets (12.5 mg total) by mouth daily. 11/27/23  Yes Dunn, Bernardino HERO, PA-C  nystatin cream (MYCOSTATIN) Apply 1 Application topically 2 (two) times daily. Apply to left breast  topically every 12 hours for 14 days.   Yes [provider]  OXYGEN Inhale 3 L into the lungs continuous.   Yes [provider]  pantoprazole  (PROTONIX ) 40 MG tablet Take 40 mg by mouth in the morning. 02/18/24  Yes [provider]  phenylephrine -shark liver oil-mineral oil-petrolatum (HEMORRHOIDAL) 0.25-14-74.9 % rectal ointment Place 1 Application rectally every 6 (six) hours as needed for hemorrhoids.   Yes [provider]  saccharomyces boulardii (FLORASTOR)  250 MG capsule Take 500 mg by mouth every morning. Give 2 capsules by mouth in the morning. 02/23/24  Yes [provider]   Current Facility-Administered Medications  Medication Dose Route Frequency Provider Last Rate Last Admin   acetaminophen  (TYLENOL ) tablet 650 mg  650 mg Oral Q6H PRN Sim Emery CROME, MD       Or   acetaminophen  (TYLENOL ) suppository 650 mg  650 mg Rectal Q6H PRN Sim Emery CROME, MD       calcium  carbonate (dosed in mg elemental calcium ) suspension 500 mg of elemental calcium   500 mg of elemental calcium  Oral Q6H PRN Sim Emery CROME, MD       camphor-menthol  (SARNA) lotion 1 Application  1 Application Topical Q8H PRN Garba, Mohammad L, MD       And   hydrOXYzine  (ATARAX ) tablet 25 mg  25 mg Oral Q8H PRN Sim Emery CROME, MD       Chlorhexidine  Gluconate Cloth 2 % PADS 6 each  6 each Topical Q0600 Geralynn Charleston, MD       docusate sodium  [facility]) enema 283 mg  1 enema Rectal PRN Garba, Mohammad L, MD       feeding supplement (NEPRO CARB STEADY) liquid 237 mL  237 mL Oral TID PRN Sim Emery CROME, MD       furosemide  (LASIX ) injection 80 mg  80 mg Intravenous BID Sim Emery L, MD   80 mg at 07/18/24 9251   insulin  aspart (novoLOG ) injection 0-5 Units  0-5 Units Subcutaneous QHS Garba, Mohammad L, MD       insulin  aspart (novoLOG ) injection 0-6 Units  0-6 Units Subcutaneous TID WC Sim Emery CROME, MD       [START ON 07/19/2024] levothyroxine  (SYNTHROID ) tablet 75 mcg  75 mcg Oral QAC breakfast Raenelle Coria, MD       LORazepam  (ATIVAN ) tablet 0.5 mg  0.5 mg Oral Q M,W,F-HD Ghimire, Kuber, MD       metoprolol  succinate (TOPROL -XL) 24 hr tablet 12.5 mg  12.5 mg Oral Daily Ghimire, Coria, MD       ondansetron  (ZOFRAN ) tablet 4 mg  4 mg Oral Q6H PRN Sim Emery CROME, MD       Or   ondansetron  (ZOFRAN ) injection 4 mg  4 mg Intravenous Q6H PRN Sim Emery CROME, MD       pantoprazole  (PROTONIX ) EC tablet 40 mg  40 mg Oral Daily Ghimire, Kuber, MD        saccharomyces boulardii (FLORASTOR) capsule 500 mg  500 mg Oral q  morning Raenelle Coria, MD       sorbitol  70 % solution 30 mL  30 mL Oral PRN Sim Emery CROME, MD       Current Outpatient Medications  Medication Sig Dispense Refill   acetaminophen  (TYLENOL ) 325 MG tablet Take 650 mg by mouth every 4 (four) hours as needed for mild pain (pain score 1-3).     amiodarone  (PACERONE ) 200 MG tablet Take 1 tablet (200 mg total) by mouth daily. 90 tablet 3   aspirin  EC 81 MG tablet Take 1 tablet (81 mg total) by mouth daily. Swallow whole. 30 tablet 12   atorvastatin  (LIPITOR ) 80 MG tablet Take 80 mg by mouth at bedtime.     bisacodyl  (FLEET) 10 MG/30ML ENEM Place 10 mg rectally daily as needed (mild to moderate constipation).     cadexomer iodine (IODOSORB) 0.9 % gel Apply 1 Application topically 3 (three) times a week. Apply to right shin venous ulcer topically every Mon, Weds, and Fri. May apply additional applications as needed.     cephALEXin  (KEFLEX ) 500 MG capsule Take 500 mg by mouth 2 (two) times daily.     clopidogrel  (PLAVIX ) 75 MG tablet Take 1 tablet (75 mg total) by mouth daily. 90 tablet 3   ferric citrate  (AURYXIA ) 1 GM 210 MG(Fe) tablet Take 210 mg by mouth 3 (three) times daily with meals.     furosemide  (LASIX ) 80 MG tablet Take 80 mg by mouth 2 (two) times daily.     lactulose , encephalopathy, (CHRONULAC ) 10 GM/15ML SOLN Take 20 g by mouth. Give 30ml by mouth one time a day for constipation.     levothyroxine  (SYNTHROID ) 75 MCG tablet Take 75 mcg by mouth daily before breakfast.     LORazepam  (ATIVAN ) 0.5 MG tablet Take 1 tablet (0.5 mg total) by mouth every Monday, Wednesday, and Friday with hemodialysis. (Patient taking differently: Take 0.5 mg by mouth 3 (three) times a week. Give one tablet by mouth one time a day on Tues, Thurs, and Sat for anxiety. Give one-half tablet (0.25mg ) prior to hemodialysis.) 10 tablet 0   metolazone (ZAROXOLYN) 10 MG tablet Take 10 mg by mouth.      metoprolol  succinate (TOPROL  XL) 25 MG 24 hr tablet Take 0.5 tablets (12.5 mg total) by mouth daily. 45 tablet 3   nystatin cream (MYCOSTATIN) Apply 1 Application topically 2 (two) times daily. Apply to left breast  topically every 12 hours for 14 days.     OXYGEN Inhale 3 L into the lungs continuous.     pantoprazole  (PROTONIX ) 40 MG tablet Take 40 mg by mouth in the morning.     phenylephrine -shark liver oil-mineral oil-petrolatum (HEMORRHOIDAL) 0.25-14-74.9 % rectal ointment Place 1 Application rectally every 6 (six) hours as needed for hemorrhoids.     saccharomyces boulardii (FLORASTOR) 250 MG capsule Take 500 mg by mouth every morning. Give 2 capsules by mouth in the morning.     Labs: Basic Metabolic Panel: Recent Labs  Lab 07/17/24 2054 07/17/24 2102 07/18/24 0525  NA 133* 131* 135  K 5.1 5.2* 5.0  CL 95* 101 98  CO2 21*  --  23  GLUCOSE 163* 159* 118*  BUN 58* 66* 58*  CREATININE 6.03* 6.10* 5.97*  CALCIUM  9.6  --  9.6  PHOS  --   --  7.5*   Liver Function Tests: Recent Labs  Lab 07/17/24 2054 07/18/24 0525  AST 18  --   ALT 14  --   ALKPHOS 187*  --  BILITOT 0.7  --   PROT 6.1*  --   ALBUMIN  3.1* 2.9*   No results for input(s): LIPASE, AMYLASE in the last 168 hours. No results for input(s): AMMONIA in the last 168 hours. CBC: Recent Labs  Lab 07/17/24 2054 07/17/24 2102 07/18/24 0525  WBC 12.1*  --  14.7*  NEUTROABS 11.0*  --   --   HGB 11.3* 12.6 10.6*  HCT 37.5 37.0 34.8*  MCV 97.9  --  98.0  PLT 210  --  191   Cardiac Enzymes: No results for input(s): CKTOTAL, CKMB, CKMBINDEX, TROPONINI in the last 168 hours. CBG: Recent Labs  Lab 07/18/24 0744  GLUCAP 95   Iron  Studies: No results for input(s): IRON , TIBC, TRANSFERRIN, FERRITIN in the last 72 hours. Studies/Results: DG Chest Port 1 View Result Date: 07/17/2024 CLINICAL DATA:  Shortness of breath EXAM: PORTABLE CHEST 1 VIEW COMPARISON:  04/17/2024 FINDINGS: Right  dialysis catheter remains in place, unchanged. Bilateral lower lobe airspace opacities and layering effusions, similar to prior study. Heart and mediastinal contours within normal limits. Aortic atherosclerosis. IMPRESSION: Bilateral pleural effusions with lower lobe airspace opacities, likely atelectasis. Findings similar to prior study. Electronically Signed   By: Franky Crease M.D.   On: 07/17/2024 21:09    ROS: All others negative except those listed in HPI.  Physical Exam: Vitals:   07/18/24 1100 07/18/24 1130 07/18/24 1212 07/18/24 1215  BP: 124/83 (!) 137/55 (!) 99/43 (!) 116/50  Pulse: (!) 54 (!) 59 (!) 51 (!) 55  Resp: 20 (!) 26 (!) 8 15  Temp:   98.2 F (36.8 C) 98.2 F (36.8 C)  TempSrc:      SpO2: 100% 100% 100% 100%     General: Elderly female; NAD Head: Sclera not icteric  Lungs: Breathing is mildly labored. Diminished at bases. No wheeze, rales or rhonchi.  Heart: RRR. No murmur, rubs or gallops.  Abdomen: soft and non-tender  Lower extremities: 2-3+ LE edema  Neuro: AAOx3. Moves all extremities spontaneously. Dialysis Access: [facility]  Dialysis Orders:  TTS - [facility] 4hrs, BFR 350, DFR 500,  EDW 55.6kg, 3K/ 2.5Ca No heparin  bolus ordered Noted Venofer  load ordered in outpatient Hectorol  8mcg IV qHD- last dose 07/12/24 Sensipar  60mg  with HD - last dose 07/12/24  Last Labs: Hgb 10.6, K 5.0, Ca 9.6, P 7.5, Alb 2.9  Assessment/Plan: Acute on chronic CHF - 2nd volume overload in the setting of missed HD. Last HD 7/22. Getting HD today. ESRD - on HD TTS. Getting HD today, UFG 3L. Continue to push UF. Hypertension/volume  - Bps coming down with HD. She's profoundly overloaded. Reinforced fluid restriction at home and low-Na diet. Anemia of CKD - Hgb 10.6. Start ESA here if Hgb drops below 10. Follow trends Secondary Hyperparathyroidism - Ca okay but PO4 is high. Start binders if she remains inpatient Nutrition - Renal diet with fluid restriction.  Dispo -  Discussed with Hospitalist. Patient's son plans to send her to a new facility. If new facility accepts her, she may be discharged today.  Charmaine Piety, NP [facility] 07/18/2024, 1:41 PM

## 2024-07-18 NOTE — TOC Initial Note (Addendum)
 Transition of Care [facility]) - Initial/Assessment Note    Patient Details  Name: Cassandra Baker MRN: [id number] Date of Birth: 1941-01-07  Transition of Care [facility]) CM/SW Contact:    Luise JAYSON Pan, LCSWA Phone Number: 07/18/2024, 3:27 PM  Clinical Narrative:    Per patient and her son, patient has been at [facility] for the last month. Son, Alm, has arranged patient to go to [facility]. Patients belongings are being moved in.  CSW notified treatment team that patient cannot discharge back to ALF today as CSW would need to communicate with facility and see if they can accept her back.   CSW left VM for Pleasant Rosella, nursing director, to call back and confirm what is needed for patient to DC back. CSW spoke with the mobility tech at [facility] and they stated they would need FL2 and DC summary faxed to [phone]. CSW to await call back fro further guidance.   3:59 PM Rebekah informed CSW that patient is a new resident/admit to [facility]. Family is coordinating patients transition there. Per facility, patient can dc to them once a bed is delivered. CSW will need to follow up with family about the bed. Per facility, at time of discharge they will need DC summary and cosigned FL2. CSW updated treatment team.   TOC will continue to follow.             Expected Discharge Plan: Assisted Living Barriers to Discharge: Continued Medical Work up   Patient Goals and CMS Choice Patient states their goals for this hospitalization and ongoing recovery are:: To go back to ALF          Expected Discharge Plan and Services In-house Referral: Clinical Social Work     Living arrangements for the past 2 months: Skilled Nursing Facility                                      Prior Living Arrangements/Services Living arrangements for the past 2 months: Skilled Nursing Facility Lives with:: Facility Resident Patient language and need for interpreter reviewed:: No Do you feel safe  going back to the place where you live?: Yes      Need for Family Participation in Patient Care: Yes (Comment) Care giver support system in place?: Yes (comment) Current home services: DME (cane, walker) Criminal Activity/Legal Involvement Pertinent to Current Situation/Hospitalization: No - Comment as needed  Activities of Daily Living      Permission Sought/Granted Permission sought to share information with : Family Supports, Oceanographer granted to share information with : Yes, Verbal Permission Granted  Share Information with NAME: Brentlee Sciara  Permission granted to share info w AGENCY: ALF  Permission granted to share info w Relationship: Son  Permission granted to share info w Contact Information: [phone]  Emotional Assessment Appearance:: Appears stated age Attitude/Demeanor/Rapport: Engaged Affect (typically observed): Pleasant Orientation: : Oriented to Self, Oriented to  Time, Oriented to Place, Oriented to Situation Alcohol / Substance Use: Not Applicable Psych Involvement: No (comment)  Admission diagnosis:  CHF exacerbation (HCC) [I50.9] Patient Active Problem List   Diagnosis Date Noted   CHF exacerbation (HCC) 07/17/2024   Combined fat and carbohydrate induced hyperlipemia 02/29/2024   HLD (hyperlipidemia) 02/29/2024   Angiodysplasia of stomach and duodenum with bleeding 02/24/2024   Gums, bleeding 02/09/2024   Pruritus, unspecified 01/23/2024   Diarrhea, unspecified 11/10/2023   Pain, unspecified  11/10/2023   Fall (on)(from) escalator, sequela 09/08/2023   Syncope 09/07/2023   Coagulation defect, unspecified (HCC) 07/27/2023   Allergy, unspecified, initial encounter 06/29/2023   Anaphylactic shock, unspecified, initial encounter 06/29/2023   Dependence on renal dialysis (HCC) 06/29/2023   Hypertensive heart and chronic kidney disease with heart failure and with stage 5 chronic kidney disease, or end stage renal  disease (HCC) 06/29/2023   Long term (current) use of aspirin  06/29/2023   Long term (current) use of oral hypoglycemic drugs 06/29/2023   Anemia in chronic kidney disease 06/29/2023   Pulmonary hypertension, unspecified (HCC) 06/29/2023   Secondary hyperparathyroidism of renal origin (HCC) 06/29/2023   Shortness of breath 06/29/2023   Unspecified asthma, uncomplicated 06/29/2023   Ventricular tachycardia, sustained (HCC) 06/15/2023   Polymorphic ventricular tachycardia (HCC) 06/14/2023   E. coli UTI 06/13/2023   ESRD (end stage renal disease) (HCC) 06/11/2023   CAD (coronary artery disease) 06/11/2023   Essential hypertension 06/11/2023   Gout 06/11/2023   Pure hypercholesterolemia 06/11/2023   Pyuria 06/11/2023   Confusion 06/05/2023   Non-ST elevation (NSTEMI) myocardial infarction (HCC) 06/04/2023   Acute on chronic combined systolic and diastolic CHF (congestive heart failure) (HCC) 06/04/2023   Acute kidney injury superimposed on chronic kidney disease (HCC) 06/04/2023   Anxiety and depression 06/04/2023   Hypothyroidism 06/04/2023   Type 2 diabetes mellitus (HCC) 06/04/2023   Acute systolic CHF (congestive heart failure) (HCC) 06/04/2023   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   Chronic kidney disease (CKD), stage IV (severe) (HCC) 10/25/2019   Lymphadenopathy, inguinal 02/09/2018   Endometrial cancer (HCC) 01/18/2018   Hyperparathyroidism, primary (HCC) 12/07/2014   Neoplasm of uncertain behavior of thyroid  gland, 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 [address] [address] [place] [address] Phone: [phone] Fax: [phone]  [facility] - [place], [place] - [address] [place] [address] Phone: [phone] Fax: [phone]     Social Drivers of Health (SDOH) Social History: SDOH Screenings   Food Insecurity: Low Risk  (04/18/2024)   Received from [facility]  Housing: Low Risk  (04/18/2024)   Received from [facility]  Recent Concern: Housing - High Risk (02/09/2024)  Transportation Needs: No Transportation Needs (04/18/2024)   Received from [facility]  Recent Concern: Transportation Needs - Unmet Transportation Needs (02/09/2024)  Utilities: Low Risk  (04/18/2024)   Received from [facility]  Social Connections: Moderately Integrated (02/09/2024)  Tobacco Use: Low Risk  (07/17/2024)   SDOH Interventions:     Readmission Risk Interventions    02/10/2024    1:20 PM 06/12/2023   10:02 AM 06/06/2023   12:08 PM  Readmission Risk Prevention Plan  Transportation Screening Complete Complete Complete  PCP or Specialist Appt within 5-7 Days   Complete  PCP or Specialist Appt within 3-5 Days  Complete   Home Care Screening   Complete  Medication Review (RN CM)   Referral to Pharmacy  HRI or Home Care Consult  Complete   Social Work Consult for Recovery Care Planning/Counseling  Complete   Palliative Care Screening  Not Applicable   Medication Review Oceanographer) Complete Complete   PCP or Specialist appointment within 3-5 days of discharge Complete  HRI or Home Care Consult Complete    SW Recovery Care/Counseling Consult Complete    Palliative Care Screening Complete    Skilled Nursing Facility Not Applicable

## 2024-07-18 NOTE — H&P (Signed)
 History and Physical    Patient: Cassandra Baker FMW:[id number] DOB: 03-Feb-1941 DOA: 07/17/2024 DOS: the patient was seen and examined on 07/18/2024 PCP: Rexanne Ingle, MD  Patient coming from: SNF  Chief Complaint:  Chief Complaint  Patient presents with   Shortness of Breath   HPI: Cassandra Baker is a 83 y.o. female with medical history significant of end-stage renal disease, diabetes, coronary artery disease, asthma, carotid artery stenosis, depression, essential hypertension, hypothyroidism, history of basal cell carcinoma among other things who is a resident of [facility] skilled facility brought in today secondary to worsening shortness of breath and leg swelling.  Patient has missed her dialysis twice.  She normally goes on Tuesdays Thursdays and Saturdays.  Patient missed dialysis on Thursday and Saturday.  Patient also has not been getting the required diet and she has been eating high level of salty foods apparently.  Family believes that is contributing to her fluid retention.  Patient still makes some urine however came in with significant bilateral lower extremity edema, evidence of anasarca and x-ray evidence of fluid overload.  Review of Systems: As mentioned in the history of present illness. All other systems reviewed and are negative. Past Medical History:  Diagnosis Date   Anemia    Arthritis    low back,knees   Asthma    Basal cell carcinoma (BCC)    a. nose   Carotid artery stenosis    CHF (congestive heart failure) (HCC)    a. prev eval by Dr. Claudene in [place] - many yrs ago.   Depression    Diabetes mellitus without complication (HCC)    ESRD on hemodialysis (HCC)    [place] MWF   Hypertension    Hypothyroidism    Past Surgical History:  Procedure Laterality Date   APPENDECTOMY     4 th grade   AV FISTULA PLACEMENT Left 05/12/2019   Procedure: BRACHIOCEPHALIC ARTERIOVENOUS (AV) FISTULA CREATION LEFT ARM;  Surgeon: Serene Gaile ORN, MD;  Location: [facility]  OR;  Service: Vascular;  Laterality: Left;   AV FISTULA PLACEMENT Left 06/22/2023   Procedure: LEFT BRACHIOBASILIC ARTERIOVENOUS (AV) FISTULA CREATION;  Surgeon: Lanis Fonda BRAVO, MD;  Location: [facility] OR;  Service: Vascular;  Laterality: Left;   CORONARY STENT INTERVENTION N/A 06/17/2023   Procedure: CORONARY STENT INTERVENTION;  Surgeon: Darron Deatrice LABOR, MD;  Location: [facility] INVASIVE CV LAB;  Service: Cardiovascular;  Laterality: N/A;   EYE SURGERY  2014   Bilateral 6 mos. apart  cataracts   FISTULA SUPERFICIALIZATION Left 10/06/2019   Procedure: ELEVATION OF ARTERIOVENOUS FISTULA LEFT ARM;  Surgeon: Serene Gaile ORN, MD;  Location: [facility] OR;  Service: Vascular;  Laterality: Left;   INSERTION OF DIALYSIS CATHETER N/A 06/22/2023   Procedure: INSERTION OF TUNNELED  DIALYSIS CATHETER USING 19CM PALINDROME CATHETER;  Surgeon: Lanis Fonda BRAVO, MD;  Location: [facility] OR;  Service: Vascular;  Laterality: N/A;   LYMPH NODE BIOPSY N/A 02/09/2018   Procedure: SENTINEL LYMPH NODE BIOPSY;  Surgeon: Eloy Herring, MD;  Location: [facility] ORS;  Service: Gynecology;  Laterality: N/A;   PARATHYROIDECTOMY N/A 12/07/2014   Procedure: PARATHYROIDECTOMY;  Surgeon: Krystal Spinner, MD;  Location: [facility] ORS;  Service: General;  Laterality: N/A;   PERIPHERAL INTRAVASCULAR LITHOTRIPSY  06/17/2023   Procedure: PERIPHERAL INTRAVASCULAR LITHOTRIPSY;  Surgeon: Darron Deatrice LABOR, MD;  Location: [facility] INVASIVE CV LAB;  Service: Cardiovascular;;   RIGHT/LEFT HEART CATH AND CORONARY ANGIOGRAPHY N/A 06/15/2023   Procedure: RIGHT/LEFT HEART CATH AND CORONARY ANGIOGRAPHY;  Surgeon: Darron Deatrice  A, MD;  Location: [facility] INVASIVE CV LAB;  Service: Cardiovascular;  Laterality: N/A;   ROBOTIC ASSISTED TOTAL HYSTERECTOMY WITH BILATERAL SALPINGO OOPHERECTOMY Bilateral 02/09/2018   Procedure: XI ROBOTIC ASSISTED TOTAL HYSTERECTOMY WITH BILATERAL SALPINGO OOPHORECTOMY;  Surgeon: Eloy Herring, MD;  Location: [facility] ORS;  Service: Gynecology;  Laterality: Bilateral;   TEMPORARY  DIALYSIS CATHETER N/A 06/12/2023   Procedure: TEMPORARY DIALYSIS CATHETER;  Surgeon: Jama Cordella MATSU, MD;  Location: [facility] INVASIVE CV LAB;  Service: Cardiovascular;  Laterality: N/A;   THYROID  LOBECTOMY Left 12/07/2014   Procedure: LEFT THYROID  LOBECTOMY;  Surgeon: Krystal Spinner, MD;  Location: [facility] ORS;  Service: General;  Laterality: Left;   TONSILLECTOMY     TUBAL LIGATION     Early thirties   Social History:  reports that she has never smoked. She has never used smokeless tobacco. She reports that she does not drink alcohol and does not use drugs.  Allergies  Allergen Reactions   Plum Pulp Anaphylaxis and Cough   Almond (Diagnostic) Itching   Lemon Oil Other (See Comments)    Lemons cause coughing and respiratory issues    Other Itching    Dairy products/Soaps & detergents   Peanut-Containing Drug Products Itching   Pork-Derived Products     Religious beliefs   Penicillins Rash    Tolerates ceftriaxone     Family History  Problem Relation Age of Onset   Hypertension Mother        MI's beginning in 12's, died @ 24   Heart disease Mother    Hypertension Father    Heart disease Father    Heart disease Brother    Stroke Brother    Cervical cancer Maternal Aunt    Heart disease Brother    Stroke Brother     Prior to Admission medications   Medication Sig Start Date End Date Taking? Authorizing Provider  acetaminophen  (TYLENOL ) 500 MG tablet Take 500-1,000 mg by mouth every 6 (six) hours as needed (pain).     [provider]  amiodarone  (PACERONE ) 200 MG tablet Take 1 tablet (200 mg total) by mouth daily. 06/27/23   Meng, Hao, PA  aspirin  EC 81 MG tablet Take 1 tablet (81 mg total) by mouth daily. Swallow whole. 06/16/23   Jens Durand, MD  cefdinir (OMNICEF) 300 MG capsule Take 300 mg by mouth 3 (three) times daily with meals as needed.    [provider]  clopidogrel  (PLAVIX ) 75 MG tablet Take 1 tablet (75 mg total) by mouth daily. 06/27/23   Meng, Hao, PA   ferric citrate  (AURYXIA ) 1 GM 210 MG(Fe) tablet Take 210 mg by mouth 3 (three) times daily with meals.    [provider]  furosemide  (LASIX ) 80 MG tablet Take 80 mg by mouth 2 (two) times daily.    [provider]  levothyroxine  (SYNTHROID ) 75 MCG tablet Take 75 mcg by mouth daily before breakfast.    [provider]  LORazepam  (ATIVAN ) 0.5 MG tablet Take 1 tablet (0.5 mg total) by mouth every Monday, Wednesday, and Friday with hemodialysis. 02/15/24   Arlice Reichert, MD  metoprolol  succinate (TOPROL  XL) 25 MG 24 hr tablet Take 0.5 tablets (12.5 mg total) by mouth daily. 11/27/23   Dunn, Bernardino HERO, PA-C  metroNIDAZOLE (FLAGYL) 500 MG tablet Take 500 mg by mouth 3 (three) times daily.    [provider]  nitroGLYCERIN  (NITROSTAT ) 0.4 MG SL tablet Place 1 tablet (0.4 mg total) under the tongue every 5 (five) minutes x 3 doses  as needed for chest pain. 06/27/23   Meng, Hao, PA  pantoprazole  (PROTONIX ) 40 MG tablet Take 40 mg by mouth daily. 02/18/24   [provider]  polyethylene glycol (MIRALAX  / GLYCOLAX ) 17 g packet Take 17 g by mouth daily as needed for mild constipation. 02/13/24   Arlice Reichert, MD  rOPINIRole  (REQUIP ) 0.5 MG tablet Take 0.5 mg by mouth at bedtime.    [provider]    Physical Exam: Vitals:   07/17/24 2100 07/17/24 2130 07/17/24 2200 07/17/24 2215  BP: (!) 164/67 (!) 159/63 (!) 153/105 (!) 172/77  Pulse: 75 72 78 79  Resp: (!) 22 (!) 25 (!) 22 (!) 23  Temp:      SpO2: 100% 100% 100% 100%   Constitutional: Acutely ill looking, NAD, calm, comfortable Eyes: PERRL, lids and conjunctivae normal ENMT: Mucous membranes are moist. Posterior pharynx clear of any exudate or lesions.Normal dentition.  Neck: normal, supple, no masses, no thyromegaly Respiratory: Bilateral crackles, no wheezing, no crackles. Normal respiratory effort. No accessory muscle use.  Cardiovascular: Regular rate and rhythm, no murmurs / rubs / gallops.  2+  pedal edema. 2+ pedal pulses. No carotid bruits.  Abdomen: no tenderness, no masses palpated. No hepatosplenomegaly. Bowel sounds positive.  Musculoskeletal: Good range of motion, no joint swelling or tenderness, Skin: no rashes, lesions, ulcers. No induration Neurologic: CN 2-12 grossly intact. Sensation intact, DTR normal. Strength 5/5 in all 4.  Psychiatric: Normal judgment and insight. Alert and oriented x 3. Normal mood  Data Reviewed:  Respiratory 25, blood pressure 172/77, white count 12.1, sodium 131 potassium 5.2, CO2 21 BUN 60 creatinine 6.1 troponin 45, glucose 159, BNP of 4500.  Troponin is 45 hemoglobin 11.3 chest x-ray showed bilateral pleural effusion with lower lobe airspace opacities likely atelectasis  Assessment and Plan:  #1 acute exacerbation of systolic CHF with fluid overload: Secondary to missing hemodialysis twice as well as poor oral intake.  Patient has been taking salty foods.  Patient still makes urine so we will diurese patient until hemodialysis in the morning.  Continue to monitor.  #2 end-stage renal disease: On hemodialysis Tuesdays Thursdays and Saturdays.  Patient to be dialyzed first thing in the morning.  #3 type 2 diabetes: Continue with sliding scale insulin .  #4 essential hypertension: Will resume home regimen once  confirmed.  #5 anxiety depression: Continue home medications.  #6 hyperlipidemia: Continue with statin  #7 hypothyroidism: Continue levothyroxine   #8 coronary artery disease: Stable.  Patient has mildly elevated elevated troponin.    Advance Care Planning:   Code Status: Full Code   Consults: Nephrologist, Dr. Lamar Fret  Family Communication: Son and daughter-in-law in the room  Severity of Illness: The appropriate patient status for this patient is INPATIENT. Inpatient status is judged to be reasonable and necessary in order to provide the required intensity of service to ensure the patient's safety. The patient's  presenting symptoms, physical exam findings, and initial radiographic and laboratory data in the context of their chronic comorbidities is felt to place them at high risk for further clinical deterioration. Furthermore, it is not anticipated that the patient will be medically stable for discharge from the hospital within 2 midnights of admission.   * I certify that at the point of admission it is my clinical judgment that the patient will require inpatient hospital care spanning beyond 2 midnights from the point of admission due to high intensity of service, high risk for further deterioration and high frequency of surveillance required.*  AuthorBETHA SIM KNOLL, MD 07/18/2024 12:17 AM  For on call review [email or web address].

## 2024-07-18 NOTE — Progress Notes (Signed)
 Pt receives out-pt HD at [facility] [place] on TTS 12:20 pm chair time. Noted pt admitted from snf. Will assist as needed.   Randine Mungo Dialysis Navigator [phone]

## 2024-07-18 NOTE — Progress Notes (Signed)
 Patient is a 83 year old female with history of ESRD on hemodialysis, hypertension, diabetes, hypothyroidism who lives at nursing home brought to the emergency room with shortness of breath and leg swelling.  Patient was also recently suffering from leg infection and was on Keflex .  She missed her dialysis x 2.  She did not go as she  felt weak and tired.  Has progressive leg swelling.  At nursing home on 3 L oxygen.  Family also reported dietary noncompliance at the nursing home. At the emergency room, hypoxemic but at her baseline.  EKG without ischemic changes.  Mildly elevated troponins but not significant.  Chest x-ray consistent with interstitial fluid.  Given dose of IV Lasix .  Admitted due to significant symptoms and needing subsequent dialysis.  Fluid overload secondary to missed hemodialysis: Dietary noncompliance.  Counseling done.   Patient is currently receiving hemodialysis today.  Next hemodialysis tomorrow.   Otherwise remains largely stable.   Called and discussed with patient's son over the phone.  Apparently patient is in the process to move from nursing home to assisted living facility at [facility] today.  Patient wants to go home.  Case management alerted for possible discharge today.  Since she is stable and next Alysis is tomorrow at 12 PM she can likely be discharged to [facility] if there are ready to accept her today.   Same-day admit.  No charge visit.  Possible discharge later today or tomorrow.  Addendum: 2:50 PM Notified by social worker that patient cannot be placed to ALF today.  Hopefully we can discharge her tomorrow morning to meet with outpatient dialysis, if not she will need hemodialysis tomorrow and then subsequent discharge.

## 2024-07-18 NOTE — Procedures (Signed)
 I was present at this dialysis session. I have reviewed the session itself and made appropriate changes.   Tolerating well just started. Wants to go home.  There were no vitals filed for this visit.  Recent Labs  Lab 07/18/24 0525  NA 135  K 5.0  CL 98  CO2 23  GLUCOSE 118*  BUN 58*  CREATININE 5.97*  CALCIUM  9.6  PHOS 7.5*    Recent Labs  Lab 07/17/24 2054 07/17/24 2102 07/18/24 0525  WBC 12.1*  --  14.7*  NEUTROABS 11.0*  --   --   HGB 11.3* 12.6 10.6*  HCT 37.5 37.0 34.8*  MCV 97.9  --  98.0  PLT 210  --  191    Scheduled Meds:  Chlorhexidine  Gluconate Cloth  6 each Topical Q0600   furosemide   80 mg Intravenous BID   [START ON 07/19/2024] heparin   2,000 Units Dialysis Once in dialysis   insulin  aspart  0-5 Units Subcutaneous QHS   insulin  aspart  0-6 Units Subcutaneous TID WC   [START ON 07/19/2024] levothyroxine   75 mcg Oral QAC breakfast   LORazepam   0.5 mg Oral Q M,W,F-HD   metoprolol  succinate  12.5 mg Oral Daily   pantoprazole   40 mg Oral Daily   saccharomyces boulardii  500 mg Oral q morning   Continuous Infusions: PRN Meds:.acetaminophen  **OR** acetaminophen , calcium  carbonate (dosed in mg elemental calcium ), camphor-menthol  **AND** hydrOXYzine , docusate sodium , feeding supplement (NEPRO CARB STEADY), [START ON 07/19/2024] heparin , ondansetron  **OR** ondansetron  (ZOFRAN ) IV, sorbitol    Jayson Player,  MD 07/18/2024, 10:32 AM

## 2024-07-19 DIAGNOSIS — N186 End stage renal disease: Secondary | ICD-10-CM | POA: Diagnosis not present

## 2024-07-19 DIAGNOSIS — I5043 Acute on chronic combined systolic (congestive) and diastolic (congestive) heart failure: Principal | ICD-10-CM

## 2024-07-19 LAB — GLUCOSE, CAPILLARY
Glucose-Capillary: 104 mg/dL — ABNORMAL HIGH (ref 70–99)
Glucose-Capillary: 71 mg/dL (ref 70–99)
Glucose-Capillary: 150 mg/dL — ABNORMAL HIGH (ref 70–99)
Glucose-Capillary: 129 mg/dL — ABNORMAL HIGH (ref 70–99)

## 2024-07-19 LAB — HEPATITIS B SURFACE ANTIBODY, QUANTITATIVE: Hep B S AB Quant (Post): <3.5 m[IU]/mL — ABNORMAL LOW

## 2024-07-19 MED ORDER — HEPARIN SODIUM (PORCINE) 1000 UNIT/ML IJ SOLN
INTRAMUSCULAR | Status: AC
  Filled 2024-07-19: qty 4

## 2024-07-19 NOTE — Procedures (Signed)
 I was present at this dialysis session. I have reviewed the session itself and made appropriate changes.   Tolerating dialysis with no issues.  Volume status somewhat improved.  Patient refusing to do full treatment and will only do 2 hours.  Patient hoping to be discharged today  [facility] Weights   07/19/24 0440 07/19/24 0826  Weight: 69.1 kg 67.4 kg    Recent Labs  Lab 07/18/24 0525  NA 135  K 5.0  CL 98  CO2 23  GLUCOSE 118*  BUN 58*  CREATININE 5.97*  CALCIUM  9.6  PHOS 7.5*    Recent Labs  Lab 07/17/24 2054 07/17/24 2102 07/18/24 0525  WBC 12.1*  --  14.7*  NEUTROABS 11.0*  --   --   HGB 11.3* 12.6 10.6*  HCT 37.5 37.0 34.8*  MCV 97.9  --  98.0  PLT 210  --  191    Scheduled Meds:  amiodarone   200 mg Oral Daily   aspirin  EC  81 mg Oral Daily   atorvastatin   80 mg Oral QHS   cephALEXin   500 mg Oral BID   Chlorhexidine  Gluconate Cloth  6 each Topical Q0600   clopidogrel   75 mg Oral Daily   ferric citrate   210 mg Oral TID WC   furosemide   80 mg Oral BID   insulin  aspart  0-5 Units Subcutaneous QHS   insulin  aspart  0-6 Units Subcutaneous TID WC   levothyroxine   75 mcg Oral QAC breakfast   LORazepam   0.5 mg Oral Q M,W,F-HD   metoprolol  succinate  12.5 mg Oral Daily   pantoprazole   40 mg Oral Daily   saccharomyces boulardii  500 mg Oral q morning   Continuous Infusions: PRN Meds:.acetaminophen  **OR** acetaminophen , calcium  carbonate (dosed in mg elemental calcium ), camphor-menthol  **AND** hydrOXYzine , docusate sodium , feeding supplement (NEPRO CARB STEADY), ondansetron  **OR** ondansetron  (ZOFRAN ) IV, sorbitol    Jayson Player,  MD 07/19/2024, 10:39 AM

## 2024-07-19 NOTE — TOC Progression Note (Signed)
 Transition of Care [facility]) - Progression Note    Patient Details  Name: Cassandra Baker MRN: [id number] Date of Birth: [birth year]  [facility] CM/SW Contact  Tom-Johnson, Harvest Muskrat, RN Phone Number: 07/19/2024, 2:40 PM  Clinical Narrative:     CM notified that patient will need a hospital bed delivered to ALF. Order called in to [employer] [facility] to deliver to ALF.  CM signing off at this time.         Expected Discharge Plan: Assisted Living Barriers to Discharge: Continued Medical Work up               Expected Discharge Plan and Services In-house Referral: Clinical Social Work     Living arrangements for the past 2 months: Skilled Nursing Facility                                       Social Drivers of Health (SDOH) Interventions SDOH Screenings   Food Insecurity: Low Risk  (04/18/2024)   Received from [facility]  Housing: Low Risk  (04/18/2024)   Received from [facility]  Recent Concern: Housing - High Risk (02/09/2024)  Transportation Needs: No Transportation Needs (04/18/2024)   Received from [facility]  Recent Concern: Transportation Needs - Unmet Transportation Needs (02/09/2024)  Utilities: Low Risk  (04/18/2024)   Received from [facility]  Social Connections: Moderately Integrated (02/09/2024)  Tobacco Use: Low Risk  (07/17/2024)    Readmission Risk Interventions    02/10/2024    1:20 PM 06/12/2023   10:02 AM 06/06/2023   12:08 PM  Readmission Risk Prevention Plan  Transportation Screening Complete Complete Complete  PCP or Specialist Appt within 5-7 Days   Complete  PCP or Specialist Appt within 3-5 Days  Complete   Home Care Screening   Complete  Medication Review (RN CM)   Referral to Pharmacy  HRI or Home Care Consult  Complete   Social Work Consult for Recovery Care Planning/Counseling  Complete   Palliative Care Screening  Not Applicable   Medication Review Oceanographer) Complete Complete   PCP  or Specialist appointment within 3-5 days of discharge Complete    HRI or Home Care Consult Complete    SW Recovery Care/Counseling Consult Complete    Palliative Care Screening Complete    Skilled Nursing Facility Not Applicable

## 2024-07-19 NOTE — Progress Notes (Signed)
    Durable Medical Equipment  (From admission, onward)           Start     Ordered   07/19/24 1444  For home use only DME Hospital bed  Once       Comments: The patient requires head of the bed to be elevated more than 30 degrees most of the time to prevent aspiration.  Question Answer Comment  Length of Need Lifetime   Patient has (list medical condition): ESRD   The above medical condition requires: Patient requires the ability to reposition frequently   Head must be elevated greater than: 45 degrees   Bed type Semi-electric      07/19/24 1445

## 2024-07-19 NOTE — Progress Notes (Signed)
 PROGRESS NOTE    Cassandra Baker  FMW:[id number] DOB: 1941/06/06 DOA: 07/17/2024 PCP: Rexanne Ingle, MD    Brief Narrative:  83 year old female with history of ESRD on hemodialysis, hypertension, diabetes, hypothyroidism who lives at nursing home brought to the emergency room with shortness of breath and leg swelling.  Patient was also recently suffering from leg infection and was on Keflex .  She missed her dialysis x 2.  She did not go as she  felt weak and tired.   At nursing home on 3 L oxygen.  Family also reported dietary noncompliance at the nursing home. At the emergency room, hypoxemic but at her baseline.  EKG without ischemic changes.  Mildly elevated troponins but not significant.  Chest x-ray consistent with interstitial fluid.  Given dose of IV Lasix .  Admitted due to significant symptoms and needing subsequent dialysis.  Subjective: Patient seen and examined.  She was receiving hemodialysis.  Ultimately, she declined to complete 4 hours of dialysis.  She tells me she is fine and looking forward to be discharged. Assessment & Plan:   Fluid overload secondary to missed dialysis, ESRD on hemodialysis: Noncompliance.  Dietary and discrepancies. Received dialysis 7/28 Received dialysis 7/29.  Clinically improving.  She has outpatient dialysis Monday Wednesday Friday.  Working on discharge so she can continue outpatient dialysis.  Paroxysmal A-fib: Patient currently maintained sinus rhythm on amiodarone .  She is not on anticoagulation.  Type 2 diabetes: On insulin .  Continue.  Chronic leg swelling/lymphedema: Patient currently without evidence of active infection.  Compression socks.  Complete cephalexin  for 1 week.  Hypothyroidism: On Synthroid .  Hypertension: Stable on metoprolol , Lasix .   DVT prophylaxis: SCDs Start: 07/18/24 0214   Code Status: Full code Family Communication: Son called and updated 7/28. Disposition Plan: Status is: Observation The patient remains  OBS appropriate and will d/c before 2 midnights. Patient is medically stable.  Patient is transferring from nursing facility to ALF.  They need to accept patient before discharging from the hospital.   Consultants:  Nephrology  Procedures:  Hemodialysis  Antimicrobials:  Keflex      Objective: Vitals:   07/19/24 1000 07/19/24 1030 07/19/24 1040 07/19/24 1053  BP: (!) 144/62 134/68 (!) 121/49 (!) 133/55  Pulse: (!) 58 63 62 66  Resp: 17 18 19    Temp:    98.7 F (37.1 C)  TempSrc:      SpO2: 100% 97% 98% 97%  Weight:    66 kg    Intake/Output Summary (Last 24 hours) at 07/19/2024 1146 Last data filed at 07/19/2024 1053 Gross per 24 hour  Intake 100 ml  Output 4600 ml  Net -4500 ml   Filed Weights   07/19/24 0440 07/19/24 0826 07/19/24 1053  Weight: 69.1 kg 67.4 kg 66 kg    Examination:  General exam: Appears calm and comfortable  Frail.  Chronically sick looking.  Not in distress.  On 3 L oxygen.  Receiving dialysis. Respiratory system: Clear to auscultation. Respiratory effort normal. Cardiovascular system: S1 & S2 heard, RRR.  Gastrointestinal system: Soft and nontender. Central nervous system: Alert and oriented. No focal neurological deficits. Gross generalized weakness. Mostly chronic lymphedematous changes and skin changes, no open abscess.     Data Reviewed: I have personally reviewed following labs and imaging studies  CBC: Recent Labs  Lab 07/17/24 2054 07/17/24 2102 07/18/24 0525  WBC 12.1*  --  14.7*  NEUTROABS 11.0*  --   --   HGB 11.3* 12.6 10.6*  HCT 37.5 37.0  34.8*  MCV 97.9  --  98.0  PLT 210  --  191   Basic Metabolic Panel: Recent Labs  Lab 07/17/24 2054 07/17/24 2102 07/18/24 0525  NA 133* 131* 135  K 5.1 5.2* 5.0  CL 95* 101 98  CO2 21*  --  23  GLUCOSE 163* 159* 118*  BUN 58* 66* 58*  CREATININE 6.03* 6.10* 5.97*  CALCIUM  9.6  --  9.6  PHOS  --   --  7.5*   GFR: Estimated Creatinine Clearance: 6.7 mL/min (A) (by  C-G formula based on SCr of 5.97 mg/dL (H)). Liver Function Tests: Recent Labs  Lab 07/17/24 2054 07/18/24 0525  AST 18  --   ALT 14  --   ALKPHOS 187*  --   BILITOT 0.7  --   PROT 6.1*  --   ALBUMIN  3.1* 2.9*   No results for input(s): LIPASE, AMYLASE in the last 168 hours. No results for input(s): AMMONIA in the last 168 hours. Coagulation Profile: No results for input(s): INR, PROTIME in the last 168 hours. Cardiac Enzymes: No results for input(s): CKTOTAL, CKMB, CKMBINDEX, TROPONINI in the last 168 hours. BNP (last 3 results) No results for input(s): PROBNP in the last 8760 hours. HbA1C: No results for input(s): HGBA1C in the last 72 hours. CBG: Recent Labs  Lab 07/18/24 0744 07/18/24 1636 07/18/24 2139 07/19/24 0624 07/19/24 1123  GLUCAP 95 146* 136* 104* 71   Lipid Profile: No results for input(s): CHOL, HDL, LDLCALC, TRIG, CHOLHDL, LDLDIRECT in the last 72 hours. Thyroid  Function Tests: No results for input(s): TSH, T4TOTAL, FREET4, T3FREE, THYROIDAB in the last 72 hours. Anemia Panel: No results for input(s): VITAMINB12, FOLATE, FERRITIN, TIBC, IRON , RETICCTPCT in the last 72 hours. Sepsis Labs: No results for input(s): PROCALCITON, LATICACIDVEN in the last 168 hours.  No results found for this or any previous visit (from the past 240 hours).       Radiology Studies: DG Chest Port 1 View Result Date: 07/17/2024 CLINICAL DATA:  Shortness of breath EXAM: PORTABLE CHEST 1 VIEW COMPARISON:  04/17/2024 FINDINGS: Right dialysis catheter remains in place, unchanged. Bilateral lower lobe airspace opacities and layering effusions, similar to prior study. Heart and mediastinal contours within normal limits. Aortic atherosclerosis. IMPRESSION: Bilateral pleural effusions with lower lobe airspace opacities, likely atelectasis. Findings similar to prior study. Electronically Signed   By: Franky Crease M.D.   On:  07/17/2024 21:09        Scheduled Meds:  amiodarone   200 mg Oral Daily   aspirin  EC  81 mg Oral Daily   atorvastatin   80 mg Oral QHS   cephALEXin   500 mg Oral BID   Chlorhexidine  Gluconate Cloth  6 each Topical Q0600   clopidogrel   75 mg Oral Daily   ferric citrate   210 mg Oral TID WC   furosemide   80 mg Oral BID   insulin  aspart  0-5 Units Subcutaneous QHS   insulin  aspart  0-6 Units Subcutaneous TID WC   levothyroxine   75 mcg Oral QAC breakfast   LORazepam   0.5 mg Oral Q M,W,F-HD   metoprolol  succinate  12.5 mg Oral Daily   pantoprazole   40 mg Oral Daily   saccharomyces boulardii  500 mg Oral q morning   Continuous Infusions:   LOS: 0 days    Time spent: 40 minutes    Renato Applebaum, MD [facility]

## 2024-07-19 NOTE — TOC Progression Note (Addendum)
 Transition of Care [facility]) - Progression Note    Patient Details  Name: Cassandra Baker MRN: [id number] Date of Birth: 02-09-1941  Transition of [facility]) CM/SW Contact  Luise JAYSON Pan, [place] Phone Number: 07/19/2024, 11:00 AM  Clinical Narrative:   CSW left voicemail for patients son, Cassandra Baker, to inquire about when a bed will be delivered to [facility].   CSW reached out to [facility] to inform CSW if they get or hear about the regular bed delivery.   2:20 PM CSW spoke with Cassandra Baker and he informed CSW that he got her regular home bed delivered to ALF room. Cassandra Baker inquired about a hospital bed after visiting patient today. CSW updated treatment team.   TOC will continue to follow.    Expected Discharge Plan: Assisted Living Barriers to Discharge: Continued Medical Work up               Expected Discharge Plan and Services In-house Referral: Clinical Social Work     Living arrangements for the past 2 months: Skilled Nursing Facility                                       Social Drivers of Health (SDOH) Interventions SDOH Screenings   Food Insecurity: Low Risk  (04/18/2024)   Received from [facility]  Housing: Low Risk  (04/18/2024)   Received from [facility]  Recent Concern: Housing - High Risk (02/09/2024)  Transportation Needs: No Transportation Needs (04/18/2024)   Received from [facility]  Recent Concern: Transportation Needs - Unmet Transportation Needs (02/09/2024)  Utilities: Low Risk  (04/18/2024)   Received from [facility]  Social Connections: Moderately Integrated (02/09/2024)  Tobacco Use: Low Risk  (07/17/2024)    Readmission Risk Interventions    02/10/2024    1:20 PM 06/12/2023   10:02 AM 06/06/2023   12:08 PM  Readmission Risk Prevention Plan  Transportation Screening Complete Complete Complete  PCP or Specialist Appt within 5-7 Days   Complete  PCP or Specialist Appt within 3-5 Days  Complete   Home Care Screening   Complete   Medication Review (RN CM)   Referral to Pharmacy  HRI or Home Care Consult  Complete   Social Work Consult for Recovery Care Planning/Counseling  Complete   Palliative Care Screening  Not Applicable   Medication Review Oceanographer) Complete Complete   PCP or Specialist appointment within 3-5 days of discharge Complete    HRI or Home Care Consult Complete    SW Recovery Care/Counseling Consult Complete    Palliative Care Screening Complete    Skilled Nursing Facility Not Applicable

## 2024-07-19 NOTE — Plan of Care (Signed)
  Problem: Skin Integrity: Goal: Risk for impaired skin integrity will decrease Outcome: Progressing   Problem: Education: Goal: Knowledge of General Education information will improve Description: Including pain rating scale, medication(s)/side effects and non-pharmacologic comfort measures Outcome: Progressing   Problem: Clinical Measurements: Goal: Will remain free from infection Outcome: Progressing   Problem: Clinical Measurements: Goal: Diagnostic test results will improve Outcome: Progressing   Problem: Clinical Measurements: Goal: Respiratory complications will improve Outcome: Progressing   Problem: Clinical Measurements: Goal: Cardiovascular complication will be avoided Outcome: Progressing

## 2024-07-19 NOTE — Progress Notes (Signed)
   07/19/24 1053  Vitals  Temp 98.7 F (37.1 C)  BP (!) 133/55  Pulse Rate 66  Weight 66 kg  Type of Weight Post-Dialysis  Oxygen Therapy  SpO2 97 %  O2 Device Nasal Cannula  Post Treatment  Dialyzer Clearance Lightly streaked  Liters Processed 52.6  Fluid Removed (mL) 1700 mL  Tolerated HD Treatment Yes  Post-Hemodialysis Comments No verbalized concerns. Patient demaned 2 hours only. Education has been received regarding early termination. Patient verbalized understanding and expressed that she does well with managing her own fluid gains and removal

## 2024-07-19 NOTE — Progress Notes (Signed)
 Pt. Grand daughter in the room,demand ambulation.Pt. stated that it is awful to keep me sitting all time. This nurse explained we encourage pt. Independence.Provide O2 extension and a walker. Pt. Daughter stated she can walk her grand mother by her self and declined this nurse help.

## 2024-07-19 NOTE — Plan of Care (Signed)

## 2024-07-20 DIAGNOSIS — I5043 Acute on chronic combined systolic (congestive) and diastolic (congestive) heart failure: Secondary | ICD-10-CM | POA: Diagnosis not present

## 2024-07-20 DIAGNOSIS — N186 End stage renal disease: Secondary | ICD-10-CM | POA: Diagnosis not present

## 2024-07-20 LAB — GLUCOSE, CAPILLARY
Glucose-Capillary: 117 mg/dL — ABNORMAL HIGH (ref 70–99)
Glucose-Capillary: 104 mg/dL — ABNORMAL HIGH (ref 70–99)

## 2024-07-20 MED ORDER — FUROSEMIDE 80 MG PO TABS: 80.0000 mg | ORAL_TABLET | Freq: Two times a day (BID) | ORAL | 0 refills | Status: AC

## 2024-07-20 MED ORDER — AMIODARONE HCL 200 MG PO TABS: 200.0000 mg | ORAL_TABLET | Freq: Every day | ORAL | 3 refills | Status: DC

## 2024-07-20 MED ORDER — LEVOTHYROXINE SODIUM 75 MCG PO TABS
75.0000 ug | ORAL_TABLET | Freq: Every day | ORAL | 0 refills | Status: DC
Start: 2024-07-20 — End: 2024-08-26

## 2024-07-20 MED ORDER — SACCHAROMYCES BOULARDII 250 MG PO CAPS: 500.0000 mg | ORAL_CAPSULE | Freq: Every morning | ORAL | 2 refills | Status: DC

## 2024-07-20 MED ORDER — PANTOPRAZOLE SODIUM 40 MG PO TBEC: 40.0000 mg | DELAYED_RELEASE_TABLET | Freq: Every morning | ORAL | 0 refills | Status: DC

## 2024-07-20 MED ORDER — FERRIC CITRATE 1 GM 210 MG(FE) PO TABS
210.0000 mg | ORAL_TABLET | Freq: Three times a day (TID) | ORAL | 0 refills | Status: DC
Start: 2024-07-20 — End: 2024-08-26

## 2024-07-20 MED ORDER — ATORVASTATIN CALCIUM 80 MG PO TABS: 80.0000 mg | ORAL_TABLET | Freq: Every day | ORAL | 0 refills | Status: AC

## 2024-07-20 MED ORDER — LACTULOSE ENCEPHALOPATHY 10 GM/15ML PO SOLN: 20.0000 g | Freq: Every day | ORAL | 0 refills | Status: AC | PRN

## 2024-07-20 MED ORDER — ACETAMINOPHEN 325 MG PO TABS: 650.0000 mg | ORAL_TABLET | ORAL | Status: AC | PRN

## 2024-07-20 MED ORDER — CLOPIDOGREL BISULFATE 75 MG PO TABS: 75.0000 mg | ORAL_TABLET | Freq: Every day | ORAL | 3 refills | Status: DC

## 2024-07-20 MED ORDER — BISACODYL 10 MG/30ML RE ENEM: 10.0000 mg | ENEMA | Freq: Every day | RECTAL | 0 refills | Status: DC | PRN

## 2024-07-20 MED ORDER — METOPROLOL SUCCINATE ER 25 MG PO TB24: 12.5000 mg | ORAL_TABLET | Freq: Every day | ORAL | 3 refills | Status: DC

## 2024-07-20 MED ORDER — LORAZEPAM 0.5 MG PO TABS: 0.5000 mg | ORAL_TABLET | ORAL | 0 refills | Status: DC

## 2024-07-20 MED ORDER — ASPIRIN 81 MG PO TBEC
81.0000 mg | DELAYED_RELEASE_TABLET | Freq: Every day | ORAL | 2 refills | Status: DC
Start: 2024-07-20 — End: 2024-08-26

## 2024-07-20 NOTE — Progress Notes (Signed)
 D/C order noted. Contacted [facility] to be advised of pt's d/c to new AL today. Clinic advised pt should resume care tomorrow. HD arrangements added to pt's AVS and clinic advised of AL name. Case discussed with CSW who is aware of pt's HD arrangements and to ensure AL is notified of these arrangements as well.   Randine Mungo Dialysis Navigator [phone]

## 2024-07-20 NOTE — Plan of Care (Signed)

## 2024-07-20 NOTE — NC FL2 (Signed)
 [place]  MEDICAID FL2 LEVEL OF CARE FORM     IDENTIFICATION  Patient Name: Cassandra Baker Birthdate: Apr 01, 1941 Sex: female Admission Date (Current Location): 07/17/2024  [facility] and [place] Number:  Producer, television/film/video and Address:  [facility], [address] [place], [place] [address]      Provider Number: [id number]  Attending Physician Name and Address:  Raenelle Coria, MD  Relative Name and Phone Number:       Current Level of Care: Hospital Recommended Level of Care: Assisted Living Facility Prior Approval Number:    Date Approved/Denied:   PASRR Number:    Discharge Plan: Other (Comment) (ALF)    Current Diagnoses: Patient Active Problem List   Diagnosis Date Noted   CHF exacerbation (HCC) 07/17/2024   Combined fat and carbohydrate induced hyperlipemia 02/29/2024   HLD (hyperlipidemia) 02/29/2024   Angiodysplasia of stomach and duodenum with bleeding 02/24/2024   Gums, bleeding 02/09/2024   Pruritus, unspecified 01/23/2024   Diarrhea, unspecified 11/10/2023   Pain, unspecified 11/10/2023   Fall (on)(from) escalator, sequela 09/08/2023   Syncope 09/07/2023   Coagulation defect, unspecified (HCC) 07/27/2023   Allergy, unspecified, initial encounter 06/29/2023   Anaphylactic shock, unspecified, initial encounter 06/29/2023   Dependence on renal dialysis (HCC) 06/29/2023   Hypertensive heart and chronic kidney disease with heart failure and with stage 5 chronic kidney disease, or end stage renal disease (HCC) 06/29/2023   Long term (current) use of aspirin  06/29/2023   Long term (current) use of oral hypoglycemic drugs 06/29/2023   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 coli UTI 06/13/2023   ESRD (end stage renal disease) (HCC) 06/11/2023   CAD (coronary artery disease) 06/11/2023   Essential hypertension 06/11/2023   Gout 06/11/2023   Pure hypercholesterolemia 06/11/2023   Pyuria 06/11/2023   Confusion 06/05/2023   Non-ST elevation (NSTEMI) myocardial infarction (HCC) 06/04/2023   Acute on chronic combined systolic and diastolic CHF (congestive heart failure) (HCC) 06/04/2023   Acute kidney injury superimposed on chronic kidney disease (HCC) 06/04/2023   Anxiety and depression 06/04/2023   Hypothyroidism 06/04/2023   Type 2 diabetes mellitus (HCC) 06/04/2023   Acute systolic CHF (congestive heart failure) (HCC) 06/04/2023   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   Chronic kidney disease (CKD), stage IV (severe) (HCC) 10/25/2019   Lymphadenopathy, inguinal 02/09/2018   Endometrial cancer (HCC) 01/18/2018   Hyperparathyroidism, primary (HCC) 12/07/2014   Neoplasm of uncertain behavior of thyroid  gland, 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   Primary hyperparathyroidism (HCC) 12/07/2014   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   Calcification, subcutaneous 10/20/2014   Diastolic dysfunction 10/20/2014   Hypertensive kidney disease, 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/20/2014    Orientation RESPIRATION BLADDER Height & Weight     Self, Time, Situation, Place  O2 (3L O2) Continent Weight: 145 lb 8.1 oz (66 kg) Height:     BEHAVIORAL SYMPTOMS/MOOD NEUROLOGICAL BOWEL NUTRITION STATUS      Continent Diet Regular- no salt added  AMBULATORY STATUS COMMUNICATION OF NEEDS Skin     Verbally Normal                       Personal Care Assistance Level of Assistance  Bathing, Feeding, Dressing Bathing Assistance:  (see  dc summary) Feeding assistance:  (see dc summary) Dressing Assistance:  (see dc summary)     Functional Limitations Info  Sight, Hearing, Speech Sight Info: Adequate Hearing Info: Adequate Speech Info: Adequate     SPECIAL CARE FACTORS FREQUENCY                       Contractures Contractures Info: Not present    Additional Factors Info  Code Status, Allergies Code Status Info: Full Allergies Info: Plum Pulp, Almond (Diagnostic), Lemon Oil, Other, Peanut-containing Drug Products, Pork-derived Products, Penicillins             Discharge Medications:  Medication List       STOP taking these medications     cadexomer iodine 0.9 % gel Commonly known as: IODOSORB    cephALEXin  500 MG capsule Commonly known as: KEFLEX     Hemorrhoidal 0.25-14-74.9 % rectal ointment Generic drug: phenylephrine -shark liver oil-mineral oil-petrolatum    metolazone 10 MG tablet Commonly known as: ZAROXOLYN    nystatin cream Commonly known as: MYCOSTATIN           TAKE these medications     acetaminophen  325 MG tablet Commonly known as: TYLENOL  Take 2 tablets (650 mg total) by mouth every 4 (four) hours as needed for mild pain (pain score 1-3).    amiodarone  200 MG tablet Commonly known as: PACERONE  Take 1 tablet (200 mg total) by mouth daily.    aspirin  EC 81 MG tablet Take 1 tablet (81 mg total) by mouth daily. Swallow whole.    atorvastatin  80 MG tablet Commonly known as: LIPITOR  Take 1 tablet (80 mg total) by mouth at bedtime.    bisacodyl  10 MG/30ML Enem Commonly known as: FLEET Place 30 mLs (10 mg total) rectally daily as needed (mild to moderate constipation).    clopidogrel  75 MG tablet Commonly known as: PLAVIX  Take 1 tablet (75 mg total) by mouth daily.    ferric citrate  1 GM 210 MG(Fe) tablet Commonly known as: Auryxia  Take 1 tablet (210 mg total) by mouth 3 (three) times daily with meals.    furosemide  80 MG tablet Commonly known as: LASIX  Take 1 tablet (80 mg total) by mouth 2 (two) times daily.    lactulose  (encephalopathy) 10 GM/15ML Soln Commonly known as: CHRONULAC  Take 30 mLs (20 g total) by mouth daily as needed (constipation). Give 30ml by mouth  one time a day for constipation. What changed:  when to take this reasons to take this    levothyroxine  75 MCG tablet Commonly known as: SYNTHROID  Take 1 tablet (75 mcg total) by mouth daily before breakfast.    LORazepam  0.5 MG tablet Commonly known as: ATIVAN  Take 1 tablet (0.5 mg total) by mouth 3 (three) times a week. Give one tablet by mouth one time a day on Tues, Thurs, and Sat for anxiety. Give one-half tablet (0.25mg ) prior to hemodialysis.    metoprolol  succinate 25 MG 24 hr tablet Commonly known as: Toprol  XL Take 0.5 tablets (12.5 mg total) by mouth daily.    OXYGEN Inhale 3 L into the lungs continuous.    pantoprazole  40 MG tablet Commonly known as: PROTONIX  Take 1 tablet (40 mg total) by mouth in the morning.    saccharomyces boulardii 250 MG capsule Commonly known as: Florastor Take 2 capsules (500 mg total) by mouth every morning. Give 2 capsules by mouth in the morning.     Relevant Imaging Results:  Relevant Lab Results:   Additional  Information SSN [id number] Pt receives out-pt HD at [facility] on TTS 12:20 pm chair time  [employer], LCSW

## 2024-07-20 NOTE — Care Management Obs Status (Signed)
 MEDICARE OBSERVATION STATUS NOTIFICATION   Patient Details  Name: Cassandra Baker MRN: [id number] Date of Birth: 04-Sep-1941   Medicare Observation Status Notification Given:  Yes   Obs letter signed and copy given.  Orion Vandervort 07/20/2024, 11:05 AM

## 2024-07-20 NOTE — Progress Notes (Signed)
 [facility] Progress Note   Subjective:    Seen and examined patient at bedside. Patient;s grand-daughter also at bedside. Tolerated yesterday's HD with net UF 1.7L. She was only on HD for 2 hrs yesterday per her request. She refused to be on treatment longer. Patient is going home today.  Objective Vitals:   07/19/24 1356 07/19/24 1613 07/19/24 2040 07/20/24 0741  BP: (!) 135/48 (!) 137/51 (!) 148/53 (!) 157/51  Pulse: 60 (!) 59 (!) 56 62  Resp: (!) 21 18  18   Temp: 97.9 F (36.6 C) 98.4 F (36.9 C) 98.3 F (36.8 C) 98.3 F (36.8 C)  TempSrc: Oral Oral Oral Oral  SpO2: 99% 99% 95% 94%  Weight:       Physical Exam General: Elderly female; NAD Head: Sclera not icteric  Lungs: Breathing is unlabored. Overall clear; No wheeze, rales or rhonchi.  Heart: RRR. No murmur, rubs or gallops.  Abdomen: soft and non-tender  Lower extremities: 2-3+ LE edema  Neuro: AAOx3.  Dialysis Access: [facility]  Filed Weights   07/19/24 0440 07/19/24 0826 07/19/24 1053  Weight: 69.1 kg 67.4 kg 66 kg    Intake/Output Summary (Last 24 hours) at 07/20/2024 1226 Last data filed at 07/20/2024 0800 Gross per 24 hour  Intake 480 ml  Output --  Net 480 ml    Additional Objective Labs: Basic Metabolic Panel: Recent Labs  Lab 07/17/24 2054 07/17/24 2102 07/18/24 0525  NA 133* 131* 135  K 5.1 5.2* 5.0  CL 95* 101 98  CO2 21*  --  23  GLUCOSE 163* 159* 118*  BUN 58* 66* 58*  CREATININE 6.03* 6.10* 5.97*  CALCIUM  9.6  --  9.6  PHOS  --   --  7.5*   Liver Function Tests: Recent Labs  Lab 07/17/24 2054 07/18/24 0525  AST 18  --   ALT 14  --   ALKPHOS 187*  --   BILITOT 0.7  --   PROT 6.1*  --   ALBUMIN  3.1* 2.9*   No results for input(s): LIPASE, AMYLASE in the last 168 hours. CBC: Recent Labs  Lab 07/17/24 2054 07/17/24 2102 07/18/24 0525  WBC 12.1*  --  14.7*  NEUTROABS 11.0*  --   --   HGB 11.3* 12.6 10.6*  HCT 37.5 37.0 34.8*  MCV 97.9  --  98.0  PLT 210   --  191   Blood Culture    Component Value Date/Time   SDES BLOOD RIGHT ARM 02/09/2024 1849   SDES BLOOD RIGHT HAND 02/09/2024 1849   SPECREQUEST  02/09/2024 1849    BOTTLES DRAWN AEROBIC AND ANAEROBIC Blood Culture results may not be optimal due to an inadequate volume of blood received in culture bottles   SPECREQUEST  02/09/2024 1849    BOTTLES DRAWN AEROBIC AND ANAEROBIC Blood Culture results may not be optimal due to an inadequate volume of blood received in culture bottles   CULT  02/09/2024 1849    NO GROWTH 5 DAYS Performed at [facility], [address] [place], [place] [address]    CULT  02/09/2024 1849    NO GROWTH 5 DAYS Performed at [facility], [address] [place], [place] [address]    REPTSTATUS 02/14/2024 FINAL 02/09/2024 1849   REPTSTATUS 02/14/2024 FINAL 02/09/2024 1849    Cardiac Enzymes: No results for input(s): CKTOTAL, CKMB, CKMBINDEX, TROPONINI in the last 168 hours. CBG: Recent Labs  Lab 07/19/24 1123 07/19/24 1653 07/19/24 2130 07/20/24  0740 07/20/24 1155  GLUCAP 71 150* 129* 117* 104*   Iron  Studies: No results for input(s): IRON , TIBC, TRANSFERRIN, FERRITIN in the last 72 hours. Lab Results  Component Value Date   INR 1.1 09/07/2023   INR 1.3 (H) 06/12/2023   INR 1.2 06/04/2023   Studies/Results: No results found.  Medications:   amiodarone   200 mg Oral Daily   aspirin  EC  81 mg Oral Daily   atorvastatin   80 mg Oral QHS   cephALEXin   500 mg Oral BID   Chlorhexidine  Gluconate Cloth  6 each Topical Q0600   clopidogrel   75 mg Oral Daily   ferric citrate   210 mg Oral TID WC   furosemide   80 mg Oral BID   insulin  aspart  0-5 Units Subcutaneous QHS   insulin  aspart  0-6 Units Subcutaneous TID WC   levothyroxine   75 mcg Oral QAC breakfast   LORazepam   0.5 mg Oral Q M,W,F-HD   metoprolol  succinate  12.5 mg Oral Daily   pantoprazole   40 mg Oral Daily   saccharomyces boulardii  500 mg Oral q morning     Dialysis Orders: TTS - [facility] 4hrs, BFR 350, DFR 500,  EDW 55.6kg, 3K/ 2.5Ca No heparin  bolus ordered Noted Venofer  load ordered in outpatient Hectorol  8mcg IV qHD- last dose 07/12/24 Sensipar  60mg  with HD - last dose 07/12/24  Assessment/Plan: Acute on chronic CHF - 2nd volume overload in the setting of missed HD. Last HD 7/22. Getting HD today. ESRD - on HD TTS. Received HD 7/28 and 7/29. Next HD 7/31 in outpatient. Hypertension/volume  - Bps coming down with HD. She's profoundly overloaded. Reinforced fluid restriction at home and low-Na diet. This is an overall compliance issue with treatment. Anemia of CKD - Hgb 10.6. Start ESA here if Hgb drops below 10. Follow trends Secondary Hyperparathyroidism - Ca okay but PO4 is high. Start binders if she remains inpatient Nutrition - Renal diet with fluid restriction.  Dispo - Plan for discharge today to an ALF. Okay for dc from a renal standpoint.   Charmaine Piety, NP [facility] 07/20/2024,12:26 PM  LOS: 0 days

## 2024-07-20 NOTE — Discharge Summary (Signed)
 Physician Discharge Summary  Cassandra Baker FMW:[id number] DOB: 01-02-1941 DOA: 07/17/2024  PCP: Rexanne Ingle, MD  Admit date: 07/17/2024 Discharge date: 07/20/2024  Admitted From: Nursing home Disposition: Assisted living facility  Recommendations for Outpatient Follow-up:  Follow up with PCP in 1-2 weeks Outpatient dialysis as a scheduled  Home Health: PT/OT Equipment/Devices: Hospital bed, oxygen available at ALF  Discharge Condition: Stable CODE STATUS: Full code Diet recommendation: Low-salt diet  Discharge summary: 83 year old female with history of ESRD on hemodialysis TTS,  hypertension, diabetes, hypothyroidism who lives at nursing home brought to the emergency room with shortness of breath and leg swelling.  Patient was also recently suffering from leg infection and was on Keflex .  She missed her dialysis x 2.  She did not go as she  felt weak and tired.   At nursing home on 3 L oxygen.  Family also reported dietary noncompliance at the nursing home. At the emergency room, hypoxemic but at her baseline.  EKG without ischemic changes.  Mildly elevated troponins but not significant.  Chest x-ray consistent with interstitial fluid.  Given dose of IV Lasix .  Admitted due to significant symptoms and needing subsequent dialysis.  Patient was given dialysis with improvement of symptoms.  Stabilized to go to ALF today.  Treated for following conditions.  Assessment & plan of care:   Fluid overload secondary to missed dialysis, ESRD on hemodialysis: Noncompliance.  Dietary and discrepancies. Received dialysis 7/28 Received dialysis 7/29.  Clinically improving.  To resume outpatient dialysis. Uses benzodiazepine, Ativan  0.5 mg before going to dialysis and that helps her get through the dialysis.  Please continue to use.   Paroxysmal A-fib: Patient currently maintained sinus rhythm on amiodarone .  She is not on anticoagulation.  On aspirin  and Plavix  that is continued.   Type 2  diabetes: Diet controlled.  Does not need any medications at this time.   Chronic leg swelling/lymphedema: Patient currently without evidence of active infection.  Compression socks.  Patient completed antibiotic therapy.   Hypothyroidism: On Synthroid .  Continue.   Hypertension: Stable on metoprolol , Lasix .  Continued.  Coronary artery disease: Stable.  On aspirin  Plavix  and a statin that is continued.  Chronic hypoxemic respiratory failure: On 3 L oxygen at baseline.  She is at her usual status.   Medically stabilized to transfer to ALF to continue care.  Discharge Diagnoses:  Principal Problem:   CHF exacerbation (HCC) Active Problems:   ESRD (end stage renal disease) (HCC)   Acute on chronic combined systolic and diastolic CHF (congestive heart failure) (HCC)   CAD (coronary artery disease)   Type 2 diabetes mellitus (HCC)   Hypothyroidism   Essential hypertension   Anxiety and depression   HLD (hyperlipidemia)    Discharge Instructions  Discharge Instructions     Diet - low sodium heart healthy   Complete by: As directed    Increase activity slowly   Complete by: As directed    No wound care   Complete by: As directed       Allergies as of 07/20/2024       Reactions   Plum Pulp Anaphylaxis, Cough   Almond (diagnostic) Itching   Lemon Oil Other (See Comments)   Lemons cause coughing and respiratory issues    Other Itching   Dairy products/Soaps & detergents   Peanut-containing Drug Products Itching   Pork-derived Products    Religious beliefs   Penicillins Rash   Tolerates ceftriaxone         Medication  List     STOP taking these medications    cadexomer iodine 0.9 % gel Commonly known as: IODOSORB   cephALEXin  500 MG capsule Commonly known as: KEFLEX    Hemorrhoidal 0.25-14-74.9 % rectal ointment Generic drug: phenylephrine -shark liver oil-mineral oil-petrolatum   metolazone 10 MG tablet Commonly known as: ZAROXOLYN   nystatin  cream Commonly known as: MYCOSTATIN       TAKE these medications    acetaminophen  325 MG tablet Commonly known as: TYLENOL  Take 2 tablets (650 mg total) by mouth every 4 (four) hours as needed for mild pain (pain score 1-3).   amiodarone  200 MG tablet Commonly known as: PACERONE  Take 1 tablet (200 mg total) by mouth daily.   aspirin  EC 81 MG tablet Take 1 tablet (81 mg total) by mouth daily. Swallow whole.   atorvastatin  80 MG tablet Commonly known as: LIPITOR  Take 1 tablet (80 mg total) by mouth at bedtime.   bisacodyl  10 MG/30ML Enem Commonly known as: FLEET Place 30 mLs (10 mg total) rectally daily as needed (mild to moderate constipation).   clopidogrel  75 MG tablet Commonly known as: PLAVIX  Take 1 tablet (75 mg total) by mouth daily.   ferric citrate  1 GM 210 MG(Fe) tablet Commonly known as: Auryxia  Take 1 tablet (210 mg total) by mouth 3 (three) times daily with meals.   furosemide  80 MG tablet Commonly known as: LASIX  Take 1 tablet (80 mg total) by mouth 2 (two) times daily.   lactulose  (encephalopathy) 10 GM/15ML Soln Commonly known as: CHRONULAC  Take 30 mLs (20 g total) by mouth daily as needed (constipation). Give 30ml by mouth one time a day for constipation. What changed:  when to take this reasons to take this   levothyroxine  75 MCG tablet Commonly known as: SYNTHROID  Take 1 tablet (75 mcg total) by mouth daily before breakfast.   LORazepam  0.5 MG tablet Commonly known as: ATIVAN  Take 1 tablet (0.5 mg total) by mouth 3 (three) times a week. Give one tablet by mouth one time a day on Tues, Thurs, and Sat for anxiety. Give one-half tablet (0.25mg ) prior to hemodialysis.   metoprolol  succinate 25 MG 24 hr tablet Commonly known as: Toprol  XL Take 0.5 tablets (12.5 mg total) by mouth daily.   OXYGEN Inhale 3 L into the lungs continuous.   pantoprazole  40 MG tablet Commonly known as: PROTONIX  Take 1 tablet (40 mg total) by mouth in the morning.    saccharomyces boulardii 250 MG capsule Commonly known as: Florastor Take 2 capsules (500 mg total) by mouth every morning. Give 2 capsules by mouth in the morning.               Durable Medical Equipment  (From admission, onward)           Start     Ordered   07/19/24 1444  For home use only DME Hospital bed  Once       Comments: The patient requires head of the bed to be elevated more than 30 degrees most of the time to prevent aspiration.  Question Answer Comment  Length of Need Lifetime   Patient has (list medical condition): ESRD   The above medical condition requires: Patient requires the ability to reposition frequently   Head must be elevated greater than: 45 degrees   Bed type Semi-electric      07/19/24 1445            Allergies  Allergen Reactions   Plum Pulp Anaphylaxis and  Cough   Almond (Diagnostic) Itching   Lemon Oil Other (See Comments)    Lemons cause coughing and respiratory issues    Other Itching    Dairy products/Soaps & detergents   Peanut-Containing Drug Products Itching   Pork-Derived Products     Religious beliefs   Penicillins Rash    Tolerates ceftriaxone     Consultations: Nephrology   Procedures/Studies: DG Chest Port 1 View Result Date: 07/17/2024 CLINICAL DATA:  Shortness of breath EXAM: PORTABLE CHEST 1 VIEW COMPARISON:  04/17/2024 FINDINGS: Right dialysis catheter remains in place, unchanged. Bilateral lower lobe airspace opacities and layering effusions, similar to prior study. Heart and mediastinal contours within normal limits. Aortic atherosclerosis. IMPRESSION: Bilateral pleural effusions with lower lobe airspace opacities, likely atelectasis. Findings similar to prior study. Electronically Signed   By: Franky Crease M.D.   On: 07/17/2024 21:09   (Echo, Carotid, EGD, Colonoscopy, ERCP)    Subjective: Patient seen and examined.  Granddaughter at the bedside.  Patient is alert awake and interactive today.  Sitting at  the edge of the bed.  Eager to be discharged.   Discharge Exam: Vitals:   07/19/24 2040 07/20/24 0741  BP: (!) 148/53 (!) 157/51  Pulse: (!) 56 62  Resp:  18  Temp: 98.3 F (36.8 C) 98.3 F (36.8 C)  SpO2: 95% 94%   Vitals:   07/19/24 1356 07/19/24 1613 07/19/24 2040 07/20/24 0741  BP: (!) 135/48 (!) 137/51 (!) 148/53 (!) 157/51  Pulse: 60 (!) 59 (!) 56 62  Resp: (!) 21 18  18   Temp: 97.9 F (36.6 C) 98.4 F (36.9 C) 98.3 F (36.8 C) 98.3 F (36.8 C)  TempSrc: Oral Oral Oral Oral  SpO2: 99% 99% 95% 94%  Weight:        General: Pt is alert, awake, not in acute distress Chronically sick looking.  Age appropriate.  Not in any distress.  Alert awake and interactive.  Pleasant interaction. Cardiovascular: RRR, S1/S2 +, no rubs, no gallops Respiratory: CTA bilaterally, no wheezing, no rhonchi.  On 3 L oxygen. Abdominal: Soft, NT, ND, bowel sounds + Extremities: Chronic nonpitting edema both legs.  Venous stasis changes.    The results of significant diagnostics from this hospitalization (including imaging, microbiology, ancillary and laboratory) are listed below for reference.     Microbiology: No results found for this or any previous visit (from the past 240 hours).   Labs: BNP (last 3 results) Recent Labs    07/17/24 2054  BNP >4,500.0*   Basic Metabolic Panel: Recent Labs  Lab 07/17/24 2054 07/17/24 2102 07/18/24 0525  NA 133* 131* 135  K 5.1 5.2* 5.0  CL 95* 101 98  CO2 21*  --  23  GLUCOSE 163* 159* 118*  BUN 58* 66* 58*  CREATININE 6.03* 6.10* 5.97*  CALCIUM  9.6  --  9.6  PHOS  --   --  7.5*   Liver Function Tests: Recent Labs  Lab 07/17/24 2054 07/18/24 0525  AST 18  --   ALT 14  --   ALKPHOS 187*  --   BILITOT 0.7  --   PROT 6.1*  --   ALBUMIN  3.1* 2.9*   No results for input(s): LIPASE, AMYLASE in the last 168 hours. No results for input(s): AMMONIA in the last 168 hours. CBC: Recent Labs  Lab 07/17/24 2054 07/17/24 2102  07/18/24 0525  WBC 12.1*  --  14.7*  NEUTROABS 11.0*  --   --   HGB 11.3* 12.6  10.6*  HCT 37.5 37.0 34.8*  MCV 97.9  --  98.0  PLT 210  --  191   Cardiac Enzymes: No results for input(s): CKTOTAL, CKMB, CKMBINDEX, TROPONINI in the last 168 hours. BNP: Invalid input(s): POCBNP CBG: Recent Labs  Lab 07/19/24 0624 07/19/24 1123 07/19/24 1653 07/19/24 2130 07/20/24 0740  GLUCAP 104* 71 150* 129* 117*   D-Dimer No results for input(s): DDIMER in the last 72 hours. Hgb A1c No results for input(s): HGBA1C in the last 72 hours. Lipid Profile No results for input(s): CHOL, HDL, LDLCALC, TRIG, CHOLHDL, LDLDIRECT in the last 72 hours. Thyroid  function studies No results for input(s): TSH, T4TOTAL, T3FREE, THYROIDAB in the last 72 hours.  Invalid input(s): FREET3 Anemia work up No results for input(s): VITAMINB12, FOLATE, FERRITIN, TIBC, IRON , RETICCTPCT in the last 72 hours. Urinalysis    Component Value Date/Time   COLORURINE YELLOW 01/14/2024 2304   APPEARANCEUR CLOUDY (A) 01/14/2024 2304   APPEARANCEUR Clear 03/03/2014 1045   LABSPEC 1.011 01/14/2024 2304   LABSPEC 1.005 03/03/2014 1045   PHURINE 5.0 01/14/2024 2304   GLUCOSEU NEGATIVE 01/14/2024 2304   GLUCOSEU 50 mg/dL 96/86/7984 8954   HGBUR SMALL (A) 01/14/2024 2304   BILIRUBINUR NEGATIVE 01/14/2024 2304   BILIRUBINUR Negative 03/03/2014 1045   KETONESUR NEGATIVE 01/14/2024 2304   PROTEINUR 100 (A) 01/14/2024 2304   NITRITE NEGATIVE 01/14/2024 2304   LEUKOCYTESUR MODERATE (A) 01/14/2024 2304   LEUKOCYTESUR 3+ 03/03/2014 1045   Sepsis Labs Recent Labs  Lab 07/17/24 2054 07/18/24 0525  WBC 12.1* 14.7*   Microbiology No results found for this or any previous visit (from the past 240 hours).   Time coordinating discharge: 45 minutes  SIGNED:   Renato Applebaum, MD  [facility] 07/20/2024, 9:15 AM

## 2024-07-20 NOTE — TOC Transition Note (Signed)
 Transition of Care [facility]) - Discharge Note   Patient Details  Name: Cassandra Baker MRN: [id number] Date of Birth: 1941/09/19  [facility]) CM/SW Contact:  Tom-Johnson, Harvest Muskrat, RN Phone Number: 07/20/2024, 2:16 PM   Clinical Narrative:     CM informed patient needs home O2. Ordered from [employer] and [employer] to deliver portable tank to patient at the discharge lounge. No further ICM needs noted.        Final next level of care: Assisted Living Barriers to Discharge: No Barriers Identified   Patient Goals and CMS Choice Patient states their goals for this hospitalization and ongoing recovery are:: To go back to ALF          Discharge Placement                       Discharge Plan and Services Additional resources added to the After Visit Summary for   In-house Referral: Clinical Social Work                                   Social Drivers of Health (SDOH) Interventions SDOH Screenings   Food Insecurity: No Food Insecurity (07/20/2024)  Housing: Low Risk  (04/18/2024)   Received from [facility]  Recent Concern: Housing - High Risk (02/09/2024)  Transportation Needs: No Transportation Needs (04/18/2024)   Received from [facility]  Recent Concern: Transportation Needs - Unmet Transportation Needs (02/09/2024)  Utilities: Low Risk  (04/18/2024)   Received from [facility]  Social Connections: Moderately Integrated (02/09/2024)  Tobacco Use: Low Risk  (07/17/2024)     Readmission Risk Interventions    02/10/2024    1:20 PM 06/12/2023   10:02 AM 06/06/2023   12:08 PM  Readmission Risk Prevention Plan  Transportation Screening Complete Complete Complete  PCP or Specialist Appt within 5-7 Days   Complete  PCP or Specialist Appt within 3-5 Days  Complete   Home Care Screening   Complete  Medication Review (RN CM)   Referral to Pharmacy  HRI or Home Care Consult  Complete   Social Work Consult for Recovery Care  Planning/Counseling  Complete   Palliative Care Screening  Not Applicable   Medication Review Oceanographer) Complete Complete   PCP or Specialist appointment within 3-5 days of discharge Complete    HRI or Home Care Consult Complete    SW Recovery Care/Counseling Consult Complete    Palliative Care Screening Complete    Skilled Nursing Facility Not Applicable

## 2024-07-20 NOTE — TOC Transition Note (Addendum)
 Transition of Care [facility]) - Discharge Note   Patient Details  Name: Cassandra Baker MRN: [id number] Date of Birth: 19-Dec-1941  Transition of Care [facility]) CM/SW Contact:  Luann SHAUNNA Cumming, LCSW Phone Number: 07/20/2024, 12:05 PM   Clinical Narrative:     [name] daughter notified RN that hospital bed was delivered at [facility].   CSW faxed fl2 and DC summary to [phone]. Called and left voicemail with [facility] DON Rebecca cline.   1200: CSW called and notified Tourist information centre manager that pt was discharging from hospital and son would be transporting her to their facility.   [name] family will transport pt to [place].    Final next level of care: Assisted Living Barriers to Discharge: No Barriers Identified   Patient Goals and CMS Choice Patient states their goals for this hospitalization and ongoing recovery are:: To go back to ALF      Discharge Plan and Services Additional resources added to the After Visit Summary for   In-house Referral: Clinical Social Work                                   Social Drivers of Health (SDOH) Interventions SDOH Screenings   Food Insecurity: No Food Insecurity (07/20/2024)  Housing: Low Risk  (04/18/2024)   Received from [facility]  Recent Concern: Housing - High Risk (02/09/2024)  Transportation Needs: No Transportation Needs (04/18/2024)   Received from [facility]  Recent Concern: Transportation Needs - Unmet Transportation Needs (02/09/2024)  Utilities: Low Risk  (04/18/2024)   Received from [facility]  Social Connections: Moderately Integrated (02/09/2024)  Tobacco Use: Low Risk  (07/17/2024)     Readmission Risk Interventions    02/10/2024    1:20 PM 06/12/2023   10:02 AM 06/06/2023   12:08 PM  Readmission Risk Prevention Plan  Transportation Screening Complete Complete Complete  PCP or Specialist Appt within 5-7 Days   Complete  PCP or Specialist Appt within 3-5 Days  Complete   Home Care Screening   Complete   Medication Review (RN CM)   Referral to Pharmacy  HRI or Home Care Consult  Complete   Social Work Consult for Recovery Care Planning/Counseling  Complete   Palliative Care Screening  Not Applicable   Medication Review Oceanographer) Complete Complete   PCP or Specialist appointment within 3-5 days of discharge Complete    HRI or Home Care Consult Complete    SW Recovery Care/Counseling Consult Complete    Palliative Care Screening Complete    Skilled Nursing Facility Not Applicable

## 2024-07-20 NOTE — Progress Notes (Addendum)
 SATURATION QUALIFICATIONS: (This note is used to comply with regulatory documentation for home oxygen)  Patient Saturations on Room Air at Rest = 95%  Patient Saturations on Room Air while Ambulating = 82%  Patient Saturations on 3 Liters of oxygen while Ambulating = 98%  Please briefly explain why patient needs home oxygen: Patients oxygen levels drop <88% when ambulating.

## 2024-07-21 NOTE — Discharge Planning (Signed)
 [facility] Patient Discharge Orders- [facility] CLINIC: [facility]-Discharged to AL  Patient's name: Cassandra Baker Admit/DC Dates: 07/17/2024 - 07/20/2024  Discharge Diagnoses: Acute on chronic CHF - 2nd volume overload in the setting of missed HD   Aranesp : Given: No    Last Hgb: 10.6 PRBC's Given: No  ESA dose for discharge: N/A IV Iron  dose at discharge: Resume Fe load  Heparin  change: N/A  EDW Change: No   Bath Change: Yes-Last K+ here was 5 (see below), change to 2K bath. Corr Ca 10.4. Also change to 2Ca bath.  Access intervention/Change: No  Hectorol  change: Yes, lower to with HD (see below)  Discharge Labs: Calcium  9.6 Corr Ca 10.4 Phosphorus 7.5  Albumin  2.9  K+ 5.0  IV Antibiotics: No  On Coumadin?: No, on ASA   Please check weekly K+ levels X 3 only!!!!!!    D/C Meds to be reconciled by nurse after every discharge.  Completed By: Charmaine Piety, NP   Reviewed by: MD:______ RN_______

## 2024-07-26 ENCOUNTER — Encounter (HOSPITAL_COMMUNITY): Payer: Self-pay | Admitting: Pharmacy Technician

## 2024-07-26 ENCOUNTER — Other Ambulatory Visit: Payer: Self-pay

## 2024-07-26 ENCOUNTER — Emergency Department (HOSPITAL_COMMUNITY)
Admission: EM | Admit: 2024-07-26 | Discharge: 2024-07-26 | Disposition: A | Discharge status: Home / Self Care | Attending: Emergency Medicine | Admitting: Emergency Medicine

## 2024-07-26 DIAGNOSIS — Z7982 Long term (current) use of aspirin: Secondary | ICD-10-CM | POA: Diagnosis not present

## 2024-07-26 DIAGNOSIS — N186 End stage renal disease: Secondary | ICD-10-CM | POA: Diagnosis not present

## 2024-07-26 DIAGNOSIS — E1122 Type 2 diabetes mellitus with diabetic chronic kidney disease: Secondary | ICD-10-CM | POA: Insufficient documentation

## 2024-07-26 DIAGNOSIS — Z79899 Other long term (current) drug therapy: Secondary | ICD-10-CM | POA: Insufficient documentation

## 2024-07-26 DIAGNOSIS — I132 Hypertensive heart and chronic kidney disease with heart failure and with stage 5 chronic kidney disease, or end stage renal disease: Secondary | ICD-10-CM | POA: Diagnosis not present

## 2024-07-26 DIAGNOSIS — Z91010 Allergy to peanuts: Secondary | ICD-10-CM | POA: Insufficient documentation

## 2024-07-26 DIAGNOSIS — K6289 Other specified diseases of anus and rectum: Secondary | ICD-10-CM | POA: Diagnosis present

## 2024-07-26 DIAGNOSIS — Z7401 Bed confinement status: Secondary | ICD-10-CM | POA: Diagnosis not present

## 2024-07-26 DIAGNOSIS — I739 Peripheral vascular disease, unspecified: Secondary | ICD-10-CM

## 2024-07-26 DIAGNOSIS — K644 Residual hemorrhoidal skin tags: Secondary | ICD-10-CM | POA: Diagnosis not present

## 2024-07-26 DIAGNOSIS — L24A2 Irritant contact dermatitis due to fecal, urinary or dual incontinence: Secondary | ICD-10-CM | POA: Insufficient documentation

## 2024-07-26 DIAGNOSIS — I509 Heart failure, unspecified: Secondary | ICD-10-CM | POA: Insufficient documentation

## 2024-07-26 DIAGNOSIS — R600 Localized edema: Secondary | ICD-10-CM | POA: Diagnosis not present

## 2024-07-26 DIAGNOSIS — L258 Unspecified contact dermatitis due to other agents: Secondary | ICD-10-CM

## 2024-07-26 DIAGNOSIS — E039 Hypothyroidism, unspecified: Secondary | ICD-10-CM | POA: Diagnosis not present

## 2024-07-26 DIAGNOSIS — Z992 Dependence on renal dialysis: Secondary | ICD-10-CM | POA: Diagnosis not present

## 2024-07-26 DIAGNOSIS — R531 Weakness: Secondary | ICD-10-CM | POA: Diagnosis not present

## 2024-07-26 DIAGNOSIS — K649 Unspecified hemorrhoids: Secondary | ICD-10-CM | POA: Diagnosis not present

## 2024-07-26 LAB — CBC WITH DIFFERENTIAL/PLATELET
Abs Immature Granulocytes: 0.13 K/uL — ABNORMAL HIGH (ref 0.00–0.07)
Basophils Absolute: 0 K/uL (ref 0.0–0.1)
Basophils Relative: 0 %
Eosinophils Absolute: 0 K/uL (ref 0.0–0.5)
Eosinophils Relative: 0 %
HCT: 35.4 % — ABNORMAL LOW (ref 36.0–46.0)
Hemoglobin: 10.5 g/dL — ABNORMAL LOW (ref 12.0–15.0)
Immature Granulocytes: 2 %
Lymphocytes Relative: 4 %
Lymphs Abs: 0.3 K/uL — ABNORMAL LOW (ref 0.7–4.0)
MCH: 30 pg (ref 26.0–34.0)
MCHC: 29.7 g/dL — ABNORMAL LOW (ref 30.0–36.0)
MCV: 101.1 fL — ABNORMAL HIGH (ref 80.0–100.0)
Monocytes Absolute: 1 K/uL (ref 0.1–1.0)
Monocytes Relative: 13 %
Neutro Abs: 6.7 K/uL (ref 1.7–7.7)
Neutrophils Relative %: 81 %
Platelets: 185 K/uL (ref 150–400)
RBC: 3.5 MIL/uL — ABNORMAL LOW (ref 3.87–5.11)
RDW: 15.7 % — ABNORMAL HIGH (ref 11.5–15.5)
WBC: 8.2 K/uL (ref 4.0–10.5)
nRBC: 0 % (ref 0.0–0.2)

## 2024-07-26 LAB — BASIC METABOLIC PANEL WITH GFR
Anion gap: 25 — ABNORMAL HIGH (ref 5–15)
BUN: 99 mg/dL — ABNORMAL HIGH (ref 8–23)
CO2: 15 mmol/L — ABNORMAL LOW (ref 22–32)
Calcium: 9 mg/dL (ref 8.9–10.3)
Chloride: 97 mmol/L — ABNORMAL LOW (ref 98–111)
Creatinine, Ser: 7.62 mg/dL — ABNORMAL HIGH (ref 0.44–1.00)
GFR, Estimated: 5 mL/min — ABNORMAL LOW (ref >60)
Glucose, Bld: 106 mg/dL — ABNORMAL HIGH (ref 70–99)
Potassium: 5.3 mmol/L — ABNORMAL HIGH (ref 3.5–5.1)
Sodium: 137 mmol/L (ref 135–145)

## 2024-07-26 MED ORDER — SODIUM ZIRCONIUM CYCLOSILICATE 10 G PO PACK
10.0000 g | PACK | Freq: Once | ORAL | Status: AC
  Administered 2024-07-26: 10 g via ORAL
  Filled 2024-07-26: qty 1

## 2024-07-26 NOTE — ED Notes (Signed)
 Got patient on the monitor go vitals help get patient clean up

## 2024-07-26 NOTE — ED Notes (Signed)
 Ptar called eta about an hour

## 2024-07-26 NOTE — Discharge Planning (Signed)
 RNCM received call from [facility] SW that pt WILL NOT have transportation to HD tomorrow.   RNCM notified EDP.  EDP confirmed the nephology that  pt is ok to miss HD tomorrow and go at her regular date and time on Thursday.  Jadee Golebiewski J. Debarah, BSN, RN, [facility]  IP Care Management  Nurse Case Manager  [facility] Emergency Departments  Operative Services  [phone]

## 2024-07-26 NOTE — Discharge Planning (Signed)
 RNCM placed call to [facility]F [phone]) and [employer] [phone]) regarding pt transportation to dialysis as Pt states that the HD center picks her up.  RNCM left voicemail for RN at [facility] and SW at [employer] to return call with transportation information.  Rush Salce J. Debarah, BSN, RN, [facility]  IP Care Management  Nurse Case Manager  [facility] Emergency Departments  Operative Services  [phone]

## 2024-07-26 NOTE — ED Notes (Signed)
 Pt incontinent of stool. Pericare performed, barrier cream applied. Mepilex placed.

## 2024-07-26 NOTE — Discharge Instructions (Addendum)
 Cassandra Baker will need a barrier cream applied 2 -3 times a day for skin breakdown in the diaper area.  Hemorrhoids are fine today and bleeding appears to be coming from the skin around the rectum in the buttocks area.  She will need to go to dialysis Thursday for regular scheduled dialysis.

## 2024-07-26 NOTE — ED Provider Notes (Addendum)
 [place] EMERGENCY DEPARTMENT AT [facility] Provider Note   CSN: [id number] Arrival date & time: 07/26/24  1018     Patient presents with: Hemorrhoids   Cassandra Baker is a 83 y.o. female.   Patient is an 83 year old female with a history of hypertension, CHF, hypothyroidism, diabetes, end-stage renal disease on dialysis Tuesday Thursday Saturday who was recently discharged from the hospital last week after evidence of fluid overload due to missed dialysis and cellulitis of lower extremity who is presenting today from her nursing home due to significant pain in her rectal region.  She reports that her stools have been intermittently formed and some have been loose but it is so painful when she has a bowel movement that she does not want to go to the bathroom.  She has had no nausea vomiting or abdominal pain.  She did report a lot of blood on Friday when she used the bathroom it was bright red.  Since that time she has just had a lot of pain in her rectal area.  It is painful all the time she cannot even sit upright because it is so painful.  She does not feel that she has any stool in her rectum.  She has not had any cough or shortness of breath.  Last dialysis  The history is provided by the patient.       Prior to Admission medications   Medication Sig Start Date End Date Taking? Authorizing Provider  acetaminophen  (TYLENOL ) 325 MG tablet Take 2 tablets (650 mg total) by mouth every 4 (four) hours as needed for mild pain (pain score 1-3). 07/20/24 08/19/24  Raenelle Coria, MD  amiodarone  (PACERONE ) 200 MG tablet Take 1 tablet (200 mg total) by mouth daily. 07/20/24   Raenelle Coria, MD  aspirin  EC 81 MG tablet Take 1 tablet (81 mg total) by mouth daily. Swallow whole. 07/20/24   Ghimire, Kuber, MD  atorvastatin  (LIPITOR ) 80 MG tablet Take 1 tablet (80 mg total) by mouth at bedtime. 07/20/24 08/19/24  Raenelle Coria, MD  bisacodyl  (FLEET) 10 MG/30ML ENEM Place 30 mLs (10 mg  total) rectally daily as needed (mild to moderate constipation). 07/20/24   Raenelle Coria, MD  clopidogrel  (PLAVIX ) 75 MG tablet Take 1 tablet (75 mg total) by mouth daily. 07/20/24   Raenelle Coria, MD  ferric citrate  (AURYXIA ) 1 GM 210 MG(Fe) tablet Take 1 tablet (210 mg total) by mouth 3 (three) times daily with meals. 07/20/24   Ghimire, Kuber, MD  furosemide  (LASIX ) 80 MG tablet Take 1 tablet (80 mg total) by mouth 2 (two) times daily. 07/20/24 08/19/24  Raenelle Coria, MD  lactulose , encephalopathy, (CHRONULAC ) 10 GM/15ML SOLN Take 30 mLs (20 g total) by mouth daily as needed (constipation). Give 30ml by mouth one time a day for constipation. 07/20/24 08/21/24  Ghimire, Kuber, MD  levothyroxine  (SYNTHROID ) 75 MCG tablet Take 1 tablet (75 mcg total) by mouth daily before breakfast. 07/20/24   Raenelle Coria, MD  LORazepam  (ATIVAN ) 0.5 MG tablet Take 1 tablet (0.5 mg total) by mouth 3 (three) times a week. Give one tablet by mouth one time a day on Tues, Thurs, and Sat for anxiety. Give one-half tablet (0.25mg ) prior to hemodialysis. 07/20/24   Ghimire, Kuber, MD  metoprolol  succinate (TOPROL  XL) 25 MG 24 hr tablet Take 0.5 tablets (12.5 mg total) by mouth daily. 07/20/24   Ghimire, Kuber, MD  OXYGEN Inhale 3 L into the lungs continuous.    [provider]  pantoprazole  (PROTONIX ) 40 MG tablet Take 1 tablet (40 mg total) by mouth in the morning. 07/20/24   Ghimire, Kuber, MD  saccharomyces boulardii (FLORASTOR) 250 MG capsule Take 2 capsules (500 mg total) by mouth every morning. Give 2 capsules by mouth in the morning. 07/20/24   Ghimire, Kuber, MD    Allergies: Plum pulp, Almond (diagnostic), Lemon oil, Other, Peanut-containing drug products, Pork-derived products, and Penicillins    Review of Systems  Updated Vital Signs BP (!) 133/48   Pulse (!) 52   Temp 97.7 F (36.5 C) (Oral)   Resp 17   LMP  (LMP Unknown)   SpO2 100%   Physical Exam Vitals and nursing note reviewed.   Constitutional:      General: She is not in acute distress.    Appearance: She is well-developed.  HENT:     Head: Normocephalic and atraumatic.  Eyes:     Pupils: Pupils are equal, round, and reactive to light.     Comments: Periorbital swelling bilaterally  Cardiovascular:     Rate and Rhythm: Normal rate and regular rhythm.     Heart sounds: Normal heart sounds. No murmur heard.    No friction rub.  Pulmonary:     Effort: Pulmonary effort is normal.     Breath sounds: Normal breath sounds. No wheezing or rales.  Abdominal:     General: Bowel sounds are normal. There is no distension.     Palpations: Abdomen is soft.     Tenderness: There is no abdominal tenderness. There is no guarding or rebound.     Comments: Minimal edema in the abdomen  Genitourinary:    Comments: Patient does have slightly enlarged nonthrombosed hemorrhoids present circumferentially around the rectum.  No stool in the rectal vault.  Skin in the rectal area, perineum and labia is irritated, macerated and raw and very painful to the touch with some mild bleeding noted from skin irritation.  Hemorrhoids are not bleeding at this time.  No internal rectal tenderness or palpable abscesses.  No induration or fluctuance. Musculoskeletal:        General: No tenderness. Normal range of motion.     Right lower leg: Edema present.     Left lower leg: Edema present.     Comments: Edema noted in bilateral lower extremities into the thighs.  Draining and wound on the right lower extremity without any erythema or warmth  Skin:    General: Skin is warm and dry.     Coloration: Skin is pale.     Findings: No rash.  Neurological:     Mental Status: She is alert and oriented to person, place, and time. Mental status is at baseline.     Cranial Nerves: No cranial nerve deficit.  Psychiatric:        Behavior: Behavior normal.     (all labs ordered are listed, but only abnormal results are displayed) Labs Reviewed  CBC  WITH DIFFERENTIAL/PLATELET - Abnormal; Notable for the following components:      Result Value   RBC 3.50 (*)    Hemoglobin 10.5 (*)    HCT 35.4 (*)    MCV 101.1 (*)    MCHC 29.7 (*)    RDW 15.7 (*)    Lymphs Abs 0.3 (*)    Abs Immature Granulocytes 0.13 (*)    All other components within normal limits  BASIC METABOLIC PANEL WITH GFR - Abnormal; Notable for the following components:   Potassium 5.3 (*)  Chloride 97 (*)    CO2 15 (*)    Glucose, Bld 106 (*)    BUN 99 (*)    Creatinine, Ser 7.62 (*)    GFR, Estimated 5 (*)    Anion gap 25 (*)    All other components within normal limits    EKG: None  Radiology: No results found.   Procedures   Medications Ordered in the ED  sodium zirconium cyclosilicate  (LOKELMA ) packet 10 g (10 g Oral Given 07/26/24 1330)                                    Medical Decision Making Amount and/or Complexity of Data Reviewed Labs: ordered. Decision-making details documented in ED Course.  Risk Prescription drug management.   Pt with multiple medical problems and comorbidities and presenting today with a complaint that caries a high risk for morbidity and mortality.  Here today with the above complaints.  Patient does have external hemorrhoids but they are not thrombosed and are not currently bleeding.  She has significant skin breakdown in the area where looks like she had sat into stool for too long and it broke down her skin.  The skin it is bleeding.  Barrier cream was applied and stool was removed from the area.  Patient has no findings concerning for Fournier's.  No findings concerning for infection in this area.  Patient does appear fluid overloaded.  She normally dialyzes on Tuesday Thursday Saturday.  She was discharged from the hospital on 727 after getting additional rounds of dialysis.  Patient also has chronic lymphedema so feel that some of the swelling is chronic.  She denies any chest pain or shortness of breath.  2:57  PM I independently interpreted patient's labs and CBC shows a stable hemoglobin of 10, normal white count and BMP with minimal elevated potassium of 5.3 and then labs otherwise consistent with end-stage renal disease who has missed multiple episodes of dialysis.  Discussed with the renal navigator and they can get her in for dialysis tomorrow morning at 6 AM but there is no transport per TOC.   Patient given 1 dose of Lokelma .  Discussed with Dr. Geralynn from nephrology and feel at time pt is stable for d/c and regular dialysis on Thursday. She will need to have a barrier cream applied in the diaper area until her skin has healed.  No evidence of yeast at this time.  At this time she appears stable for discharge back to her facility.      Final diagnoses:  Dermatitis associated with moisture from stool incontinence    ED Discharge Orders     None          Doretha Folks, MD 07/26/24 1457    Doretha Folks, MD 07/26/24 1525

## 2024-07-26 NOTE — Progress Notes (Signed)
 Contacted [facility] to be advised that pt will not make appt tomorrow but will resume on Thursday (pt's normal day).   Randine Mungo Dialysis Navigator [phone]

## 2024-07-26 NOTE — ED Triage Notes (Signed)
 Pt here via ems from [place] with reports of painful hemorrhoids X4 days. States some bleeding. VSS with ems.

## 2024-07-27 ENCOUNTER — Other Ambulatory Visit

## 2024-07-27 ENCOUNTER — Encounter

## 2024-07-27 DIAGNOSIS — R296 Repeated falls: Secondary | ICD-10-CM | POA: Diagnosis not present

## 2024-07-27 DIAGNOSIS — R2681 Unsteadiness on feet: Secondary | ICD-10-CM | POA: Diagnosis not present

## 2024-07-28 DIAGNOSIS — D631 Anemia in chronic kidney disease: Secondary | ICD-10-CM | POA: Diagnosis not present

## 2024-07-28 DIAGNOSIS — D508 Other iron deficiency anemias: Secondary | ICD-10-CM | POA: Diagnosis not present

## 2024-07-28 DIAGNOSIS — N186 End stage renal disease: Secondary | ICD-10-CM | POA: Diagnosis not present

## 2024-07-28 DIAGNOSIS — N2581 Secondary hyperparathyroidism of renal origin: Secondary | ICD-10-CM | POA: Diagnosis not present

## 2024-07-28 DIAGNOSIS — Z992 Dependence on renal dialysis: Secondary | ICD-10-CM | POA: Diagnosis not present

## 2024-07-29 DIAGNOSIS — R2681 Unsteadiness on feet: Secondary | ICD-10-CM | POA: Diagnosis not present

## 2024-07-29 DIAGNOSIS — R296 Repeated falls: Secondary | ICD-10-CM | POA: Diagnosis not present

## 2024-08-01 DIAGNOSIS — R509 Fever, unspecified: Secondary | ICD-10-CM | POA: Diagnosis not present

## 2024-08-01 DIAGNOSIS — I499 Cardiac arrhythmia, unspecified: Secondary | ICD-10-CM | POA: Diagnosis not present

## 2024-08-01 DIAGNOSIS — I469 Cardiac arrest, cause unspecified: Secondary | ICD-10-CM | POA: Diagnosis not present

## 2024-08-17 ENCOUNTER — Ambulatory Visit: Attending: Cardiology | Admitting: Cardiology

## 2024-08-18 ENCOUNTER — Encounter

## 2024-08-18 ENCOUNTER — Other Ambulatory Visit

## 2024-08-22 DEATH — deceased

## 2024-08-29 ENCOUNTER — Encounter (HOSPITAL_COMMUNITY)

## 2024-08-29 ENCOUNTER — Ambulatory Visit: Admitting: Surgery
